# Patient Record
Sex: Male | Born: 1935 | Race: White | Hispanic: No | Marital: Married | Smoking: Former smoker
Health system: Southern US, Community
[De-identification: ages and names within clinical notes are randomized; demographics above are authoritative.]

## PROBLEM LIST (undated history)

## (undated) DIAGNOSIS — Z8619 Personal history of other infectious and parasitic diseases: Secondary | ICD-10-CM

## (undated) DIAGNOSIS — L309 Dermatitis, unspecified: Secondary | ICD-10-CM

## (undated) DIAGNOSIS — E119 Type 2 diabetes mellitus without complications: Secondary | ICD-10-CM

## (undated) DIAGNOSIS — A0472 Enterocolitis due to Clostridium difficile, not specified as recurrent: Secondary | ICD-10-CM

## (undated) DIAGNOSIS — H3500 Unspecified background retinopathy: Secondary | ICD-10-CM

## (undated) DIAGNOSIS — B269 Mumps without complication: Secondary | ICD-10-CM

## (undated) DIAGNOSIS — Q5310 Unspecified undescended testicle, unilateral: Secondary | ICD-10-CM

## (undated) DIAGNOSIS — H269 Unspecified cataract: Secondary | ICD-10-CM

## (undated) DIAGNOSIS — B029 Zoster without complications: Secondary | ICD-10-CM

## (undated) DIAGNOSIS — I639 Cerebral infarction, unspecified: Secondary | ICD-10-CM

## (undated) DIAGNOSIS — A3790 Whooping cough, unspecified species without pneumonia: Secondary | ICD-10-CM

## (undated) DIAGNOSIS — I4891 Unspecified atrial fibrillation: Secondary | ICD-10-CM

## (undated) HISTORY — DX: Whooping cough, unspecified species without pneumonia: A37.90

## (undated) HISTORY — DX: Unspecified atrial fibrillation: I48.91

## (undated) HISTORY — PX: TOOTH EXTRACTION: SUR596

## (undated) HISTORY — DX: Unspecified background retinopathy: H35.00

## (undated) HISTORY — DX: Unspecified undescended testicle, unilateral: Q53.10

## (undated) HISTORY — DX: Personal history of other infectious and parasitic diseases: Z86.19

## (undated) HISTORY — DX: Dermatitis, unspecified: L30.9

## (undated) HISTORY — DX: Mumps without complication: B26.9

## (undated) HISTORY — PX: TESTICLE SURGERY: SHX794

## (undated) HISTORY — PX: CARDIAC VALVE REPLACEMENT: SHX585

## (undated) HISTORY — PX: PRE-MALIGNANT / BENIGN SKIN LESION EXCISION: SHX160

## (undated) HISTORY — DX: Type 2 diabetes mellitus without complications: E11.9

## (undated) HISTORY — DX: Unspecified cataract: H26.9

## (undated) HISTORY — DX: Cerebral infarction, unspecified: I63.9

---

## 1998-09-23 DIAGNOSIS — E119 Type 2 diabetes mellitus without complications: Secondary | ICD-10-CM

## 1998-09-23 HISTORY — DX: Type 2 diabetes mellitus without complications: E11.9

## 2010-07-24 DIAGNOSIS — I639 Cerebral infarction, unspecified: Secondary | ICD-10-CM

## 2010-07-24 HISTORY — DX: Cerebral infarction, unspecified: I63.9

## 2013-09-27 DIAGNOSIS — Z7901 Long term (current) use of anticoagulants: Secondary | ICD-10-CM | POA: Diagnosis not present

## 2013-09-27 DIAGNOSIS — I4891 Unspecified atrial fibrillation: Secondary | ICD-10-CM | POA: Diagnosis not present

## 2013-10-04 DIAGNOSIS — E1139 Type 2 diabetes mellitus with other diabetic ophthalmic complication: Secondary | ICD-10-CM | POA: Diagnosis not present

## 2013-10-04 DIAGNOSIS — H2700 Aphakia, unspecified eye: Secondary | ICD-10-CM | POA: Diagnosis not present

## 2013-10-04 DIAGNOSIS — H35369 Drusen (degenerative) of macula, unspecified eye: Secondary | ICD-10-CM | POA: Diagnosis not present

## 2013-10-04 DIAGNOSIS — H26499 Other secondary cataract, unspecified eye: Secondary | ICD-10-CM | POA: Diagnosis not present

## 2013-10-28 DIAGNOSIS — L538 Other specified erythematous conditions: Secondary | ICD-10-CM | POA: Diagnosis not present

## 2013-10-28 DIAGNOSIS — L259 Unspecified contact dermatitis, unspecified cause: Secondary | ICD-10-CM | POA: Diagnosis not present

## 2013-11-08 DIAGNOSIS — I4891 Unspecified atrial fibrillation: Secondary | ICD-10-CM | POA: Diagnosis not present

## 2013-11-22 DIAGNOSIS — E7800 Pure hypercholesterolemia, unspecified: Secondary | ICD-10-CM | POA: Diagnosis not present

## 2013-11-22 DIAGNOSIS — E119 Type 2 diabetes mellitus without complications: Secondary | ICD-10-CM | POA: Diagnosis not present

## 2013-11-22 DIAGNOSIS — I4891 Unspecified atrial fibrillation: Secondary | ICD-10-CM | POA: Diagnosis not present

## 2013-11-29 DIAGNOSIS — E119 Type 2 diabetes mellitus without complications: Secondary | ICD-10-CM | POA: Diagnosis not present

## 2013-11-29 DIAGNOSIS — Z23 Encounter for immunization: Secondary | ICD-10-CM | POA: Diagnosis not present

## 2013-11-29 DIAGNOSIS — E7800 Pure hypercholesterolemia, unspecified: Secondary | ICD-10-CM | POA: Diagnosis not present

## 2013-12-13 DIAGNOSIS — I4891 Unspecified atrial fibrillation: Secondary | ICD-10-CM | POA: Diagnosis not present

## 2013-12-17 DIAGNOSIS — Z23 Encounter for immunization: Secondary | ICD-10-CM | POA: Diagnosis not present

## 2013-12-27 DIAGNOSIS — I4891 Unspecified atrial fibrillation: Secondary | ICD-10-CM | POA: Diagnosis not present

## 2014-02-11 DIAGNOSIS — I4891 Unspecified atrial fibrillation: Secondary | ICD-10-CM | POA: Diagnosis not present

## 2014-02-21 DIAGNOSIS — E7800 Pure hypercholesterolemia, unspecified: Secondary | ICD-10-CM | POA: Diagnosis not present

## 2014-02-21 DIAGNOSIS — Z0000 Encounter for general adult medical examination without abnormal findings: Secondary | ICD-10-CM | POA: Diagnosis not present

## 2014-02-21 DIAGNOSIS — Z125 Encounter for screening for malignant neoplasm of prostate: Secondary | ICD-10-CM | POA: Diagnosis not present

## 2014-02-21 DIAGNOSIS — E119 Type 2 diabetes mellitus without complications: Secondary | ICD-10-CM | POA: Diagnosis not present

## 2014-02-28 DIAGNOSIS — E119 Type 2 diabetes mellitus without complications: Secondary | ICD-10-CM | POA: Diagnosis not present

## 2014-03-07 DIAGNOSIS — I4891 Unspecified atrial fibrillation: Secondary | ICD-10-CM | POA: Diagnosis not present

## 2014-03-22 DIAGNOSIS — I4891 Unspecified atrial fibrillation: Secondary | ICD-10-CM | POA: Diagnosis not present

## 2014-04-04 DIAGNOSIS — E1139 Type 2 diabetes mellitus with other diabetic ophthalmic complication: Secondary | ICD-10-CM | POA: Diagnosis not present

## 2014-04-04 DIAGNOSIS — H2700 Aphakia, unspecified eye: Secondary | ICD-10-CM | POA: Diagnosis not present

## 2014-04-04 DIAGNOSIS — H35369 Drusen (degenerative) of macula, unspecified eye: Secondary | ICD-10-CM | POA: Diagnosis not present

## 2014-04-04 DIAGNOSIS — I4891 Unspecified atrial fibrillation: Secondary | ICD-10-CM | POA: Diagnosis not present

## 2014-04-06 DIAGNOSIS — Z7901 Long term (current) use of anticoagulants: Secondary | ICD-10-CM | POA: Diagnosis not present

## 2014-04-06 DIAGNOSIS — I4891 Unspecified atrial fibrillation: Secondary | ICD-10-CM | POA: Diagnosis not present

## 2014-04-08 DIAGNOSIS — I669 Occlusion and stenosis of unspecified cerebral artery: Secondary | ICD-10-CM | POA: Diagnosis not present

## 2014-04-08 DIAGNOSIS — I4891 Unspecified atrial fibrillation: Secondary | ICD-10-CM | POA: Diagnosis not present

## 2014-05-23 DIAGNOSIS — D1800 Hemangioma unspecified site: Secondary | ICD-10-CM | POA: Diagnosis not present

## 2014-05-23 DIAGNOSIS — Z85828 Personal history of other malignant neoplasm of skin: Secondary | ICD-10-CM | POA: Diagnosis not present

## 2014-05-23 DIAGNOSIS — L570 Actinic keratosis: Secondary | ICD-10-CM | POA: Diagnosis not present

## 2014-05-23 DIAGNOSIS — L821 Other seborrheic keratosis: Secondary | ICD-10-CM | POA: Diagnosis not present

## 2014-06-06 DIAGNOSIS — E119 Type 2 diabetes mellitus without complications: Secondary | ICD-10-CM | POA: Diagnosis not present

## 2014-06-06 DIAGNOSIS — I4891 Unspecified atrial fibrillation: Secondary | ICD-10-CM | POA: Diagnosis not present

## 2014-06-13 DIAGNOSIS — E119 Type 2 diabetes mellitus without complications: Secondary | ICD-10-CM | POA: Diagnosis not present

## 2014-06-13 DIAGNOSIS — M545 Low back pain, unspecified: Secondary | ICD-10-CM | POA: Diagnosis not present

## 2014-06-13 DIAGNOSIS — W19XXXA Unspecified fall, initial encounter: Secondary | ICD-10-CM | POA: Diagnosis not present

## 2014-07-04 DIAGNOSIS — I482 Chronic atrial fibrillation: Secondary | ICD-10-CM | POA: Diagnosis not present

## 2014-07-18 DIAGNOSIS — I482 Chronic atrial fibrillation: Secondary | ICD-10-CM | POA: Diagnosis not present

## 2014-07-25 DIAGNOSIS — E1165 Type 2 diabetes mellitus with hyperglycemia: Secondary | ICD-10-CM | POA: Diagnosis not present

## 2014-09-05 DIAGNOSIS — E1129 Type 2 diabetes mellitus with other diabetic kidney complication: Secondary | ICD-10-CM | POA: Diagnosis not present

## 2014-09-05 DIAGNOSIS — I482 Chronic atrial fibrillation: Secondary | ICD-10-CM | POA: Diagnosis not present

## 2014-09-12 DIAGNOSIS — E1165 Type 2 diabetes mellitus with hyperglycemia: Secondary | ICD-10-CM | POA: Diagnosis not present

## 2014-09-26 DIAGNOSIS — S52552A Other extraarticular fracture of lower end of left radius, initial encounter for closed fracture: Secondary | ICD-10-CM | POA: Diagnosis not present

## 2014-09-26 DIAGNOSIS — M25532 Pain in left wrist: Secondary | ICD-10-CM | POA: Diagnosis not present

## 2014-09-26 DIAGNOSIS — M25519 Pain in unspecified shoulder: Secondary | ICD-10-CM | POA: Diagnosis not present

## 2014-09-26 DIAGNOSIS — S40012A Contusion of left shoulder, initial encounter: Secondary | ICD-10-CM | POA: Diagnosis not present

## 2014-09-26 DIAGNOSIS — W19XXXA Unspecified fall, initial encounter: Secondary | ICD-10-CM | POA: Diagnosis not present

## 2014-09-26 DIAGNOSIS — M25512 Pain in left shoulder: Secondary | ICD-10-CM | POA: Diagnosis not present

## 2014-10-04 DIAGNOSIS — M25512 Pain in left shoulder: Secondary | ICD-10-CM | POA: Diagnosis not present

## 2014-10-04 DIAGNOSIS — S52552D Other extraarticular fracture of lower end of left radius, subsequent encounter for closed fracture with routine healing: Secondary | ICD-10-CM | POA: Diagnosis not present

## 2014-10-04 DIAGNOSIS — M5412 Radiculopathy, cervical region: Secondary | ICD-10-CM | POA: Diagnosis not present

## 2014-10-06 DIAGNOSIS — S134XXA Sprain of ligaments of cervical spine, initial encounter: Secondary | ICD-10-CM | POA: Diagnosis not present

## 2014-10-06 DIAGNOSIS — M5412 Radiculopathy, cervical region: Secondary | ICD-10-CM | POA: Diagnosis not present

## 2014-10-06 DIAGNOSIS — M9901 Segmental and somatic dysfunction of cervical region: Secondary | ICD-10-CM | POA: Diagnosis not present

## 2014-10-06 DIAGNOSIS — M25512 Pain in left shoulder: Secondary | ICD-10-CM | POA: Diagnosis not present

## 2014-10-11 DIAGNOSIS — M25532 Pain in left wrist: Secondary | ICD-10-CM | POA: Diagnosis not present

## 2014-10-18 DIAGNOSIS — S52552D Other extraarticular fracture of lower end of left radius, subsequent encounter for closed fracture with routine healing: Secondary | ICD-10-CM | POA: Diagnosis not present

## 2014-11-01 DIAGNOSIS — S52552D Other extraarticular fracture of lower end of left radius, subsequent encounter for closed fracture with routine healing: Secondary | ICD-10-CM | POA: Diagnosis not present

## 2014-11-15 DIAGNOSIS — H918X9 Other specified hearing loss, unspecified ear: Secondary | ICD-10-CM | POA: Diagnosis not present

## 2014-11-28 DIAGNOSIS — S52552D Other extraarticular fracture of lower end of left radius, subsequent encounter for closed fracture with routine healing: Secondary | ICD-10-CM | POA: Diagnosis not present

## 2014-12-05 DIAGNOSIS — I482 Chronic atrial fibrillation: Secondary | ICD-10-CM | POA: Diagnosis not present

## 2014-12-05 DIAGNOSIS — E1165 Type 2 diabetes mellitus with hyperglycemia: Secondary | ICD-10-CM | POA: Diagnosis not present

## 2014-12-13 DIAGNOSIS — E1165 Type 2 diabetes mellitus with hyperglycemia: Secondary | ICD-10-CM | POA: Diagnosis not present

## 2014-12-15 DIAGNOSIS — M9901 Segmental and somatic dysfunction of cervical region: Secondary | ICD-10-CM | POA: Diagnosis not present

## 2014-12-15 DIAGNOSIS — M25512 Pain in left shoulder: Secondary | ICD-10-CM | POA: Diagnosis not present

## 2014-12-15 DIAGNOSIS — S134XXA Sprain of ligaments of cervical spine, initial encounter: Secondary | ICD-10-CM | POA: Diagnosis not present

## 2014-12-15 DIAGNOSIS — M5412 Radiculopathy, cervical region: Secondary | ICD-10-CM | POA: Diagnosis not present

## 2014-12-22 DIAGNOSIS — I482 Chronic atrial fibrillation: Secondary | ICD-10-CM | POA: Diagnosis not present

## 2015-01-16 DIAGNOSIS — I482 Chronic atrial fibrillation: Secondary | ICD-10-CM | POA: Diagnosis not present

## 2015-01-30 DIAGNOSIS — M25512 Pain in left shoulder: Secondary | ICD-10-CM | POA: Diagnosis not present

## 2015-01-30 DIAGNOSIS — R0782 Intercostal pain: Secondary | ICD-10-CM | POA: Diagnosis not present

## 2015-01-30 DIAGNOSIS — Z7901 Long term (current) use of anticoagulants: Secondary | ICD-10-CM | POA: Diagnosis not present

## 2015-02-09 DIAGNOSIS — I4891 Unspecified atrial fibrillation: Secondary | ICD-10-CM | POA: Diagnosis not present

## 2015-02-09 DIAGNOSIS — Z7901 Long term (current) use of anticoagulants: Secondary | ICD-10-CM | POA: Diagnosis not present

## 2015-02-09 DIAGNOSIS — I480 Paroxysmal atrial fibrillation: Secondary | ICD-10-CM | POA: Diagnosis not present

## 2015-02-09 DIAGNOSIS — I669 Occlusion and stenosis of unspecified cerebral artery: Secondary | ICD-10-CM | POA: Diagnosis not present

## 2015-02-21 DIAGNOSIS — I482 Chronic atrial fibrillation: Secondary | ICD-10-CM | POA: Diagnosis not present

## 2015-02-27 DIAGNOSIS — Z961 Presence of intraocular lens: Secondary | ICD-10-CM | POA: Diagnosis not present

## 2015-02-27 DIAGNOSIS — Z0000 Encounter for general adult medical examination without abnormal findings: Secondary | ICD-10-CM | POA: Diagnosis not present

## 2015-02-27 DIAGNOSIS — E1139 Type 2 diabetes mellitus with other diabetic ophthalmic complication: Secondary | ICD-10-CM | POA: Diagnosis not present

## 2015-02-27 DIAGNOSIS — H35363 Drusen (degenerative) of macula, bilateral: Secondary | ICD-10-CM | POA: Diagnosis not present

## 2015-02-27 DIAGNOSIS — H2703 Aphakia, bilateral: Secondary | ICD-10-CM | POA: Diagnosis not present

## 2015-02-27 DIAGNOSIS — Z125 Encounter for screening for malignant neoplasm of prostate: Secondary | ICD-10-CM | POA: Diagnosis not present

## 2015-02-27 DIAGNOSIS — E1165 Type 2 diabetes mellitus with hyperglycemia: Secondary | ICD-10-CM | POA: Diagnosis not present

## 2015-02-28 DIAGNOSIS — M25512 Pain in left shoulder: Secondary | ICD-10-CM | POA: Diagnosis not present

## 2015-02-28 DIAGNOSIS — M9901 Segmental and somatic dysfunction of cervical region: Secondary | ICD-10-CM | POA: Diagnosis not present

## 2015-02-28 DIAGNOSIS — M797 Fibromyalgia: Secondary | ICD-10-CM | POA: Diagnosis not present

## 2015-02-28 DIAGNOSIS — M531 Cervicobrachial syndrome: Secondary | ICD-10-CM | POA: Diagnosis not present

## 2015-03-06 DIAGNOSIS — E1165 Type 2 diabetes mellitus with hyperglycemia: Secondary | ICD-10-CM | POA: Diagnosis not present

## 2015-03-06 DIAGNOSIS — W19XXXD Unspecified fall, subsequent encounter: Secondary | ICD-10-CM | POA: Diagnosis not present

## 2015-03-07 DIAGNOSIS — M25512 Pain in left shoulder: Secondary | ICD-10-CM | POA: Diagnosis not present

## 2015-03-07 DIAGNOSIS — M75101 Unspecified rotator cuff tear or rupture of right shoulder, not specified as traumatic: Secondary | ICD-10-CM | POA: Diagnosis not present

## 2015-03-07 DIAGNOSIS — S4352XA Sprain of left acromioclavicular joint, initial encounter: Secondary | ICD-10-CM | POA: Diagnosis not present

## 2015-03-10 DIAGNOSIS — L814 Other melanin hyperpigmentation: Secondary | ICD-10-CM | POA: Diagnosis not present

## 2015-03-10 DIAGNOSIS — L570 Actinic keratosis: Secondary | ICD-10-CM | POA: Diagnosis not present

## 2015-03-10 DIAGNOSIS — L303 Infective dermatitis: Secondary | ICD-10-CM | POA: Diagnosis not present

## 2015-03-10 DIAGNOSIS — Z85820 Personal history of malignant melanoma of skin: Secondary | ICD-10-CM | POA: Diagnosis not present

## 2015-03-16 DIAGNOSIS — M5032 Other cervical disc degeneration, mid-cervical region: Secondary | ICD-10-CM | POA: Diagnosis not present

## 2015-03-16 DIAGNOSIS — S134XXA Sprain of ligaments of cervical spine, initial encounter: Secondary | ICD-10-CM | POA: Diagnosis not present

## 2015-03-16 DIAGNOSIS — M5412 Radiculopathy, cervical region: Secondary | ICD-10-CM | POA: Diagnosis not present

## 2015-03-16 DIAGNOSIS — M9901 Segmental and somatic dysfunction of cervical region: Secondary | ICD-10-CM | POA: Diagnosis not present

## 2015-03-20 DIAGNOSIS — M25512 Pain in left shoulder: Secondary | ICD-10-CM | POA: Diagnosis not present

## 2015-03-20 DIAGNOSIS — R262 Difficulty in walking, not elsewhere classified: Secondary | ICD-10-CM | POA: Diagnosis not present

## 2015-03-23 DIAGNOSIS — M5412 Radiculopathy, cervical region: Secondary | ICD-10-CM | POA: Diagnosis not present

## 2015-03-23 DIAGNOSIS — M9901 Segmental and somatic dysfunction of cervical region: Secondary | ICD-10-CM | POA: Diagnosis not present

## 2015-03-23 DIAGNOSIS — M25512 Pain in left shoulder: Secondary | ICD-10-CM | POA: Diagnosis not present

## 2015-03-23 DIAGNOSIS — S4352XA Sprain of left acromioclavicular joint, initial encounter: Secondary | ICD-10-CM | POA: Diagnosis not present

## 2015-03-23 DIAGNOSIS — R262 Difficulty in walking, not elsewhere classified: Secondary | ICD-10-CM | POA: Diagnosis not present

## 2015-03-24 DIAGNOSIS — R2689 Other abnormalities of gait and mobility: Secondary | ICD-10-CM | POA: Diagnosis not present

## 2015-03-24 DIAGNOSIS — S00432A Contusion of left ear, initial encounter: Secondary | ICD-10-CM | POA: Diagnosis not present

## 2015-03-24 DIAGNOSIS — W19XXXA Unspecified fall, initial encounter: Secondary | ICD-10-CM | POA: Diagnosis not present

## 2015-03-24 DIAGNOSIS — Z7901 Long term (current) use of anticoagulants: Secondary | ICD-10-CM | POA: Diagnosis not present

## 2015-03-30 DIAGNOSIS — S23140A Subluxation of T6/T7 thoracic vertebra, initial encounter: Secondary | ICD-10-CM | POA: Diagnosis not present

## 2015-03-30 DIAGNOSIS — S2341XA Sprain of ribs, initial encounter: Secondary | ICD-10-CM | POA: Diagnosis not present

## 2015-03-30 DIAGNOSIS — G548 Other nerve root and plexus disorders: Secondary | ICD-10-CM | POA: Diagnosis not present

## 2015-03-30 DIAGNOSIS — M6240 Contracture of muscle, unspecified site: Secondary | ICD-10-CM | POA: Diagnosis not present

## 2015-04-06 DIAGNOSIS — S13160A Subluxation of C5/C6 cervical vertebrae, initial encounter: Secondary | ICD-10-CM | POA: Diagnosis not present

## 2015-04-06 DIAGNOSIS — M9901 Segmental and somatic dysfunction of cervical region: Secondary | ICD-10-CM | POA: Diagnosis not present

## 2015-04-06 DIAGNOSIS — S4352XA Sprain of left acromioclavicular joint, initial encounter: Secondary | ICD-10-CM | POA: Diagnosis not present

## 2015-04-06 DIAGNOSIS — M40293 Other kyphosis, cervicothoracic region: Secondary | ICD-10-CM | POA: Diagnosis not present

## 2015-04-13 DIAGNOSIS — M5412 Radiculopathy, cervical region: Secondary | ICD-10-CM | POA: Diagnosis not present

## 2015-04-13 DIAGNOSIS — R600 Localized edema: Secondary | ICD-10-CM | POA: Diagnosis not present

## 2015-04-13 DIAGNOSIS — Z7901 Long term (current) use of anticoagulants: Secondary | ICD-10-CM | POA: Diagnosis not present

## 2015-04-13 DIAGNOSIS — E785 Hyperlipidemia, unspecified: Secondary | ICD-10-CM | POA: Diagnosis not present

## 2015-04-13 DIAGNOSIS — M5032 Other cervical disc degeneration, mid-cervical region: Secondary | ICD-10-CM | POA: Diagnosis not present

## 2015-04-13 DIAGNOSIS — I480 Paroxysmal atrial fibrillation: Secondary | ICD-10-CM | POA: Diagnosis not present

## 2015-04-21 DIAGNOSIS — I480 Paroxysmal atrial fibrillation: Secondary | ICD-10-CM | POA: Diagnosis not present

## 2015-04-21 DIAGNOSIS — I348 Other nonrheumatic mitral valve disorders: Secondary | ICD-10-CM | POA: Diagnosis not present

## 2015-05-24 ENCOUNTER — Encounter: Payer: Self-pay | Admitting: Physician Assistant

## 2015-05-24 ENCOUNTER — Ambulatory Visit (INDEPENDENT_AMBULATORY_CARE_PROVIDER_SITE_OTHER): Payer: Medicare Other | Admitting: Physician Assistant

## 2015-05-24 VITALS — BP 104/66 | HR 65 | Temp 98.1°F | Resp 14 | Ht 66.0 in | Wt 177.0 lb

## 2015-05-24 DIAGNOSIS — Z8673 Personal history of transient ischemic attack (TIA), and cerebral infarction without residual deficits: Secondary | ICD-10-CM | POA: Diagnosis not present

## 2015-05-24 DIAGNOSIS — E785 Hyperlipidemia, unspecified: Secondary | ICD-10-CM | POA: Diagnosis not present

## 2015-05-24 DIAGNOSIS — E1165 Type 2 diabetes mellitus with hyperglycemia: Secondary | ICD-10-CM

## 2015-05-24 DIAGNOSIS — I480 Paroxysmal atrial fibrillation: Secondary | ICD-10-CM | POA: Diagnosis not present

## 2015-05-24 DIAGNOSIS — IMO0002 Reserved for concepts with insufficient information to code with codable children: Secondary | ICD-10-CM

## 2015-05-24 NOTE — Patient Instructions (Signed)
Please go to the lab for blood work. I will call you with your results.  Please stop they thyroid medication as you have never been diagnosed with low thyroid.  You will be contacted by Cardiology. You will also be contacted by PT for assessment.

## 2015-05-24 NOTE — Progress Notes (Signed)
Pre visit review using our clinic review tool, if applicable. No additional management support is needed unless otherwise documented below in the visit note/SLS  

## 2015-05-24 NOTE — Progress Notes (Signed)
 Patient presents to clinic today to establish care.  Patient with hx of stroke in 2011. Is currently on 325 mg ASA daily as he could not tolerate stronger therapies due to falls and bleeds.Is taking metoprolol for rate-control due to history of atrial fibrillation. Is in need of Cardiologist in the area. Patient currently on lisinopril 5 mg daily, Glipizide 5 mg and Metformin 500 mg once daily for diabetes that wife reports has previously been very well-controlled. Denies retinopathy, neuropathy or nephropathy. Is taking Lovastatin 10 mg daily to lower cholesterol.   Patient's wife patient is currently on levothyroxine daily without ever being diagnosed with hypothyroidism. States previous MD said it would help metabolism due to diabetes. States she thought this was "strange".  Past Medical History  Diagnosis Date  . Stroke Nov 2011  . Diabetes type 2, controlled 2000  . History of chicken pox   . Whooping cough   . Mumps     Adult  . A-fib   . Cataracts, bilateral   . Retinopathy   . Undescended testicle, unilateral     Right    Past Surgical History  Procedure Laterality Date  . Tooth extraction    . Pre-malignant / benign skin lesion excision    . Testicle surgery      Right, undescended    No current outpatient prescriptions on file prior to visit.   No current facility-administered medications on file prior to visit.    Allergies  Allergen Reactions  . Shellfish Allergy Anaphylaxis  . Other Other (See Comments)    Blood Thinner given in Rehab gave H/As - Not Coumadin    Family History  Problem Relation Age of Onset  . Pneumonia Mother 49    Deceased  . GI Bleed Father 56    Deceased - Ulcers  . Diabetes Cousin   . Diabetes Brother   . Cancer Paternal Aunt     Social History   Social History  . Marital Status: Married    Spouse Name: N/A  . Number of Children: N/A  . Years of Education: N/A   Occupational History  . Not on file.   Social History  Main Topics  . Smoking status: Never Smoker   . Smokeless tobacco: Not on file     Comment: Hasn't smoked since age 25  . Alcohol Use: Not on file  . Drug Use: Not on file  . Sexual Activity: Not on file   Other Topics Concern  . Not on file   Social History Narrative  . No narrative on file    Review of Systems  Eyes: Negative for blurred vision and double vision.  Respiratory: Negative for cough and sputum production.   Cardiovascular: Negative for chest pain and palpitations.  Neurological: Negative for dizziness, loss of consciousness and headaches.  Psychiatric/Behavioral: Negative for depression. The patient is not nervous/anxious.     BP 104/66 mmHg  Pulse 65  Temp(Src) 98.1 F (36.7 C) (Oral)  Resp 14  Ht 5' 6" (1.676 m)  Wt 177 lb (80.287 kg)  BMI 28.58 kg/m2  SpO2 95%  Physical Exam  Constitutional: He is oriented to person, place, and time and well-developed, well-nourished, and in no distress.  HENT:  Head: Normocephalic and atraumatic.  Eyes: Conjunctivae are normal.  Cardiovascular: Normal rate, regular rhythm, normal heart sounds and intact distal pulses.   Pulmonary/Chest: Effort normal and breath sounds normal. No respiratory distress. He has no wheezes. He has no rales. He exhibits   no tenderness.  Neurological: He is alert and oriented to person, place, and time.  Skin: Skin is warm and dry. No rash noted.  Psychiatric: Affect normal.  Vitals reviewed.   Recent Results (from the past 2160 hour(s))  CBC     Status: None   Collection Time: 05/24/15  3:43 PM  Result Value Ref Range   WBC 8.9 4.0 - 10.5 K/uL   RBC 4.61 4.22 - 5.81 Mil/uL   Platelets 210.0 150.0 - 400.0 K/uL   Hemoglobin 13.2 13.0 - 17.0 g/dL   HCT 39.3 39.0 - 52.0 %   MCV 85.3 78.0 - 100.0 fl   MCHC 33.5 30.0 - 36.0 g/dL   RDW 14.6 11.5 - 15.5 %  Comp Met (CMET)     Status: Abnormal   Collection Time: 05/24/15  3:43 PM  Result Value Ref Range   Sodium 138 135 - 145 mEq/L    Potassium 4.7 3.5 - 5.1 mEq/L   Chloride 103 96 - 112 mEq/L   CO2 25 19 - 32 mEq/L   Glucose, Bld 159 (H) 70 - 99 mg/dL   BUN 19 6 - 23 mg/dL   Creatinine, Ser 0.97 0.40 - 1.50 mg/dL   Total Bilirubin 0.4 0.2 - 1.2 mg/dL   Alkaline Phosphatase 65 39 - 117 U/L   AST 19 0 - 37 U/L   ALT 20 0 - 53 U/L   Total Protein 7.1 6.0 - 8.3 g/dL   Albumin 4.0 3.5 - 5.2 g/dL   Calcium 9.6 8.4 - 10.5 mg/dL   GFR 77.36 >60.00 mL/min  TSH     Status: None   Collection Time: 05/24/15  3:43 PM  Result Value Ref Range   TSH 3.38 0.35 - 4.50 uIU/mL  Hemoglobin A1c     Status: Abnormal   Collection Time: 05/24/15  3:43 PM  Result Value Ref Range   Hgb A1c MFr Bld 7.8 (H) 4.6 - 6.5 %    Comment: Glycemic Control Guidelines for People with Diabetes:Non Diabetic:  <6%Goal of Therapy: <7%Additional Action Suggested:  >8%     Assessment/Plan: Diabetes mellitus type II, uncontrolled Glycemic control unknown. Is not currently checking CBGs. Is on multiple medications. Will check BMP and A1C to assess need for medication changes. Continue medications as directed for now.    Will check TSH to assess need for his levothyroxine.  Hyperlipidemia Continue current medication regimen.  Paroxysmal atrial fibrillation Continue ASA 325 mg and metoprolol. Will refer to Cardiology to optimize patient's care.        

## 2015-05-25 LAB — CBC
HCT: 39.3 % (ref 39.0–52.0)
HEMOGLOBIN: 13.2 g/dL (ref 13.0–17.0)
MCHC: 33.5 g/dL (ref 30.0–36.0)
MCV: 85.3 fl (ref 78.0–100.0)
PLATELETS: 210 10*3/uL (ref 150.0–400.0)
RBC: 4.61 Mil/uL (ref 4.22–5.81)
RDW: 14.6 % (ref 11.5–15.5)
WBC: 8.9 10*3/uL (ref 4.0–10.5)

## 2015-05-25 LAB — COMPREHENSIVE METABOLIC PANEL
ALK PHOS: 65 U/L (ref 39–117)
ALT: 20 U/L (ref 0–53)
AST: 19 U/L (ref 0–37)
Albumin: 4 g/dL (ref 3.5–5.2)
BILIRUBIN TOTAL: 0.4 mg/dL (ref 0.2–1.2)
BUN: 19 mg/dL (ref 6–23)
CO2: 25 meq/L (ref 19–32)
CREATININE: 0.97 mg/dL (ref 0.40–1.50)
Calcium: 9.6 mg/dL (ref 8.4–10.5)
Chloride: 103 mEq/L (ref 96–112)
GFR: 77.36 mL/min (ref >60.00)
GLUCOSE: 159 mg/dL — AB (ref 70–99)
Potassium: 4.7 mEq/L (ref 3.5–5.1)
Sodium: 138 mEq/L (ref 135–145)
TOTAL PROTEIN: 7.1 g/dL (ref 6.0–8.3)

## 2015-05-25 LAB — HEMOGLOBIN A1C: Hgb A1c MFr Bld: 7.8 % — ABNORMAL HIGH (ref 4.6–6.5)

## 2015-05-25 LAB — TSH: TSH: 3.38 u[IU]/mL (ref 0.35–4.50)

## 2015-05-29 DIAGNOSIS — E1165 Type 2 diabetes mellitus with hyperglycemia: Secondary | ICD-10-CM | POA: Insufficient documentation

## 2015-05-29 DIAGNOSIS — E785 Hyperlipidemia, unspecified: Secondary | ICD-10-CM | POA: Insufficient documentation

## 2015-05-29 DIAGNOSIS — I480 Paroxysmal atrial fibrillation: Secondary | ICD-10-CM | POA: Insufficient documentation

## 2015-05-29 DIAGNOSIS — Z8673 Personal history of transient ischemic attack (TIA), and cerebral infarction without residual deficits: Secondary | ICD-10-CM | POA: Insufficient documentation

## 2015-05-29 DIAGNOSIS — IMO0002 Reserved for concepts with insufficient information to code with codable children: Secondary | ICD-10-CM | POA: Insufficient documentation

## 2015-05-29 NOTE — Assessment & Plan Note (Addendum)
Glycemic control unknown. Is not currently checking CBGs. Is on multiple medications. Will check BMP and A1C to assess need for medication changes. Continue medications as directed for now.    Will check TSH to assess need for his levothyroxine.

## 2015-05-29 NOTE — Assessment & Plan Note (Signed)
Continue current medication regimen

## 2015-05-29 NOTE — Assessment & Plan Note (Signed)
Continue ASA 325 mg and metoprolol. Will refer to Cardiology to optimize patient's care.

## 2015-06-01 ENCOUNTER — Telehealth: Payer: Self-pay | Admitting: Physician Assistant

## 2015-06-01 NOTE — Telephone Encounter (Signed)
Caller name:Wisnieski,Elnora Relation to pt: spouse  Call back number: [phone]   Reason for call:  Spouse returning your call regarding lab results. Spouse states please leave detailed message

## 2015-06-02 NOTE — Telephone Encounter (Signed)
SEE Result note. 

## 2015-06-03 DIAGNOSIS — R296 Repeated falls: Secondary | ICD-10-CM | POA: Diagnosis not present

## 2015-06-03 DIAGNOSIS — I480 Paroxysmal atrial fibrillation: Secondary | ICD-10-CM | POA: Diagnosis not present

## 2015-06-03 DIAGNOSIS — E119 Type 2 diabetes mellitus without complications: Secondary | ICD-10-CM | POA: Diagnosis not present

## 2015-06-03 DIAGNOSIS — I4891 Unspecified atrial fibrillation: Secondary | ICD-10-CM | POA: Diagnosis not present

## 2015-06-03 DIAGNOSIS — E039 Hypothyroidism, unspecified: Secondary | ICD-10-CM | POA: Diagnosis not present

## 2015-06-03 DIAGNOSIS — Z87891 Personal history of nicotine dependence: Secondary | ICD-10-CM | POA: Diagnosis not present

## 2015-06-03 DIAGNOSIS — Z7982 Long term (current) use of aspirin: Secondary | ICD-10-CM | POA: Diagnosis not present

## 2015-06-03 DIAGNOSIS — Z8673 Personal history of transient ischemic attack (TIA), and cerebral infarction without residual deficits: Secondary | ICD-10-CM | POA: Diagnosis not present

## 2015-06-03 DIAGNOSIS — R0789 Other chest pain: Secondary | ICD-10-CM | POA: Diagnosis not present

## 2015-06-03 DIAGNOSIS — I4510 Unspecified right bundle-branch block: Secondary | ICD-10-CM | POA: Diagnosis not present

## 2015-06-03 DIAGNOSIS — R002 Palpitations: Secondary | ICD-10-CM | POA: Diagnosis not present

## 2015-06-03 DIAGNOSIS — Z79899 Other long term (current) drug therapy: Secondary | ICD-10-CM | POA: Diagnosis not present

## 2015-06-04 DIAGNOSIS — I480 Paroxysmal atrial fibrillation: Secondary | ICD-10-CM | POA: Diagnosis not present

## 2015-06-05 ENCOUNTER — Other Ambulatory Visit: Payer: Self-pay | Admitting: Physician Assistant

## 2015-06-05 DIAGNOSIS — Z8673 Personal history of transient ischemic attack (TIA), and cerebral infarction without residual deficits: Secondary | ICD-10-CM

## 2015-06-09 ENCOUNTER — Ambulatory Visit (INDEPENDENT_AMBULATORY_CARE_PROVIDER_SITE_OTHER): Payer: Medicare Other | Admitting: Physician Assistant

## 2015-06-09 VITALS — BP 112/60 | HR 62 | Temp 97.5°F | Resp 20 | Wt 173.8 lb

## 2015-06-09 DIAGNOSIS — I480 Paroxysmal atrial fibrillation: Secondary | ICD-10-CM | POA: Diagnosis not present

## 2015-06-09 DIAGNOSIS — E871 Hypo-osmolality and hyponatremia: Secondary | ICD-10-CM | POA: Diagnosis not present

## 2015-06-09 LAB — BASIC METABOLIC PANEL
BUN: 28 mg/dL — AB (ref 6–23)
CO2: 24 meq/L (ref 19–32)
Calcium: 9.6 mg/dL (ref 8.4–10.5)
Chloride: 97 mEq/L (ref 96–112)
Creatinine, Ser: 1.01 mg/dL (ref 0.40–1.50)
GFR: 73.83 mL/min (ref >60.00)
GLUCOSE: 225 mg/dL — AB (ref 70–99)
POTASSIUM: 4.3 meq/L (ref 3.5–5.1)
Sodium: 133 mEq/L — ABNORMAL LOW (ref 135–145)

## 2015-06-09 MED ORDER — LISINOPRIL 5 MG PO TABS: 5.0000 mg | ORAL_TABLET | Freq: Every day | ORAL | Status: DC

## 2015-06-09 MED ORDER — GLIPIZIDE 5 MG PO TABS: ORAL_TABLET | ORAL | Status: DC

## 2015-06-09 MED ORDER — AMIODARONE HCL 200 MG PO TABS: 200.0000 mg | ORAL_TABLET | Freq: Every day | ORAL | Status: DC

## 2015-06-09 MED ORDER — LOVASTATIN 10 MG PO TABS: 10.0000 mg | ORAL_TABLET | Freq: Every day | ORAL | Status: DC

## 2015-06-09 MED ORDER — METOPROLOL TARTRATE 25 MG PO TABS: 12.5000 mg | ORAL_TABLET | Freq: Two times a day (BID) | ORAL | Status: DC

## 2015-06-09 MED ORDER — METFORMIN HCL ER 500 MG PO TB24: 500.0000 mg | ORAL_TABLET | Freq: Two times a day (BID) | ORAL | Status: DC

## 2015-06-09 NOTE — Patient Instructions (Signed)
Please continue medications as directed, including the Amiodarone.  Our office will call you once we have attempted to move up your Cardiology appointment.  Stay well hydrated and get plenty of rest. Energy levels should return soon.  Go to the lab for blood work.  I will call you with results.

## 2015-06-09 NOTE — Progress Notes (Signed)
Pre visit review using our clinic review tool, if applicable. No additional management support is needed unless otherwise documented below in the visit note. 

## 2015-06-12 ENCOUNTER — Encounter: Payer: Self-pay | Admitting: Physician Assistant

## 2015-06-16 ENCOUNTER — Encounter: Payer: Self-pay | Admitting: Physician Assistant

## 2015-06-22 ENCOUNTER — Encounter: Payer: Self-pay | Admitting: Cardiology

## 2015-06-22 ENCOUNTER — Ambulatory Visit (INDEPENDENT_AMBULATORY_CARE_PROVIDER_SITE_OTHER): Payer: Medicare Other | Admitting: Cardiology

## 2015-06-22 VITALS — BP 118/60 | HR 63 | Ht 66.0 in | Wt 176.0 lb

## 2015-06-22 DIAGNOSIS — E1165 Type 2 diabetes mellitus with hyperglycemia: Secondary | ICD-10-CM

## 2015-06-22 DIAGNOSIS — Z8673 Personal history of transient ischemic attack (TIA), and cerebral infarction without residual deficits: Secondary | ICD-10-CM

## 2015-06-22 DIAGNOSIS — E785 Hyperlipidemia, unspecified: Secondary | ICD-10-CM

## 2015-06-22 DIAGNOSIS — I480 Paroxysmal atrial fibrillation: Secondary | ICD-10-CM

## 2015-06-22 DIAGNOSIS — W19XXXS Unspecified fall, sequela: Secondary | ICD-10-CM

## 2015-06-22 DIAGNOSIS — R296 Repeated falls: Secondary | ICD-10-CM | POA: Insufficient documentation

## 2015-06-22 DIAGNOSIS — IMO0002 Reserved for concepts with insufficient information to code with codable children: Secondary | ICD-10-CM

## 2015-06-22 DIAGNOSIS — W19XXXA Unspecified fall, initial encounter: Secondary | ICD-10-CM | POA: Insufficient documentation

## 2015-06-22 MED ORDER — ASPIRIN 81 MG PO TABS: 81.0000 mg | ORAL_TABLET | Freq: Every day | ORAL | Status: DC

## 2015-06-22 MED ORDER — LISINOPRIL 2.5 MG PO TABS: 2.5000 mg | ORAL_TABLET | Freq: Every day | ORAL | Status: DC

## 2015-06-22 NOTE — Progress Notes (Signed)
Cardiology Office Note   Date:  06/22/2015   ID:  Gerald Holt, DOB 01/22/1924, MRN [id number]  PCP:  Gerald Rio, PA-C  Cardiologist:   Gerald Furbish, MD       History of Present Illness: Gerald Holt is a 81 y.o. male here for evaluation of atrial fibrillation, to establish care. He is a prior history of stroke, diabetes (hemoglobin A1c 7.8), hyperlipidemia.  AFIB was diagnosed in 1984. Chest pressure ([place]). Cold glass of OJ brought it on). He was doing some work around the house, was hospitalized for 4 days. Metoprolol started then.  1991 next episode, working around the house. Looked gray. Reverted quickly.   2011 next episode. Occurred at Night.   8/16 - this is his last episode which happened a few weeks ago at night had episode. 24 hours reverted. Started amiodarone. Prefer not to be on drugs if possible. Wife was very helpful with history. Has not been the same since then.   [place] five times this year. One of the falls occurred and was quite serious resulting in fractured ribs, fell into a ditch while trying to use a weedeater. Because of the fall, his anticoagulation was discontinued. Aspirin was started.   About to start PT.   Moved from [facility], saw [facility] Dr. Corrin Holt.   CHADS-VASc is at least 6.  Now off thyroid. Medication.  Stroke 2011 - was on anticoagulation for 5 years. Coumadin. Falls. ECHO - looked fine. Broke ribs, head.   No prior cardioversions.  Past Medical History  Diagnosis Date  . Stroke Nov 2011  . Diabetes type 2, controlled 2000  . History of chicken pox   . Whooping cough   . Mumps     Adult  . A-fib   . Cataracts, bilateral   . Retinopathy   . Undescended testicle, unilateral     Right    Past Surgical History  Procedure Laterality Date  . Tooth extraction    . Pre-malignant / benign skin lesion excision    . Testicle surgery      Right, undescended     Current Outpatient  Prescriptions  Medication Sig Dispense Refill  . amiodarone (PACERONE) 200 MG tablet Take 1 tablet (200 mg total) by mouth daily. 90 tablet 0  . Ascorbic Acid (VITAMIN C) 1000 MG tablet Take 1,000 mg by mouth daily.    Marland Kitchen aspirin 325 MG tablet Take 325 mg by mouth daily.    . Calcium Citrate 200 MG TABS Take 1 tablet by mouth daily.    . Cholecalciferol (VITAMIN D-3) 1000 UNITS CAPS Take 1 capsule by mouth 2 (two) times daily.    Marland Kitchen docusate sodium (COLACE) 100 MG capsule Take 100 mg by mouth daily.    . Eyelid Cleansers (STERILID EX) Apply topically. Eye Wash: Once Daily    . glipiZIDE (GLUCOTROL) 5 MG tablet Take [2] in Am after B'fast and [1] in PM after Dinner 270 tablet 1  . ketoconazole (NIZORAL) 2 % shampoo Apply topically.    Marland Kitchen levothyroxine (SYNTHROID, LEVOTHROID) 50 MCG tablet Take by mouth.    Marland Kitchen lisinopril (PRINIVIL,ZESTRIL) 5 MG tablet Take 1 tablet (5 mg total) by mouth daily. 90 tablet 1  . loratadine (SM LORATADINE) 5 MG/5ML syrup Take by mouth as needed.    . lovastatin (MEVACOR) 10 MG tablet Take 1 tablet (10 mg total) by mouth at bedtime. 90 tablet 1  . metFORMIN (GLUCOPHAGE-XR) 500 MG 24 hr tablet  Take 1 tablet (500 mg total) by mouth 2 (two) times daily with a meal. [2] in AM with B'fast 180 tablet 1  . metoprolol tartrate (LOPRESSOR) 25 MG tablet Take 0.5 tablets (12.5 mg total) by mouth 2 (two) times daily. 180 tablet 1  . Miconazole Nitrate (TRIPLE PASTE AF EX) Apply topically. Jock Itch Cream: 1-2 Times Daily As Needed    . Multiple Vitamin (MULTI-VITAMINS) TABS Take by mouth.    . Multiple Vitamins-Minerals (CENTRUM SILVER ULTRA MENS) TABS Take 1 tablet by mouth daily.    . Omega-3 Fatty Acids (SB OMEGA-3 FISH OIL) 1000 MG CAPS Take by mouth.    Marland Kitchen OVER THE COUNTER MEDICATION Take 1 each by mouth 2 (two) times daily. Focus Select Supplement: Vitamin C & E, Zinc & Copper    . saw palmetto 160 MG capsule Take 160 mg by mouth daily. Saw Palmetto Berry Extract: Prostate    .  Sennosides (LAXATIVE PILLS PO) Take 2 capsules by mouth daily. GENTLE MOVE     No current facility-administered medications for this visit.    Allergies:   Shellfish allergy and Other    Social History:  The patient  reports that he has never smoked. He does not have any smokeless tobacco history on file.   Family History:  The patient's family history includes Cancer in his paternal aunt; Diabetes in his brother and cousin; GI Bleed (age of onset: 1) in his father; Pneumonia (age of onset: 60) in his mother.    ROS:  Please see the history of present illness. Has recent history of increased nausea, falls, decreased energy.   Otherwise, review of systems are positive for none.   All other systems are reviewed and negative.    PHYSICAL EXAM: VS:  BP 118/60 mmHg  Pulse 63  Ht 5\' 6"  (1.676 m)  Wt 176 lb (79.833 kg)  BMI 28.42 kg/m2 , BMI Body mass index is 28.42 kg/(m^2). GEN: Elderly, in no acute distress HEENT: normal Neck: no JVD, carotid bruits, or masses Cardiac: RRR; Soft systolic RUSB murmur, norubs, or gallops, 1-2+ BLE edema  Respiratory:  clear to auscultation bilaterally, normal work of breathing GI: soft, nontender, nondistended, + BS MS: no deformity or atrophy Skin: warm and dry, no rash Neuro:  Strength and sensation are intact Psych: euthymic mood, full affect   EKG: Today 06/22/15-sinus rhythm, 63, right bundle branch block, no other abnormalities.   Recent Labs: 05/24/2015: ALT 20; Hemoglobin 13.2; Platelets 210.0; TSH 3.38 06/09/2015: BUN 28*; Creatinine, Ser 1.01; Potassium 4.3; Sodium 133*    Lipid Panel No results found for: CHOL, TRIG, HDL, CHOLHDL, VLDL, LDLCALC, LDLDIRECT    Wt Readings from Last 3 Encounters:  06/22/15 176 lb (79.833 kg)  06/09/15 173 lb 12.8 oz (78.835 kg)  05/24/15 177 lb (80.287 kg)      Other studies Reviewed: Additional studies/ records that were reviewed today include: Lab work, office note, EKG reviewed. We will be  obtaining old cardiology records. Review of the above records demonstrates: As above   ASSESSMENT AND PLAN:  1.  Paroxysmal atrial fibrillation  - Currently on amiodarone - TSH normal, ALT normal  - His wife is quite eager for him to be off of the amiodarone and as it was post to her when it was started, I could help make that decision when they moved to [place]. She would like to go and stop this. Concerned about potential side effects. We discussed the potential for his atrial fibrillation  to return. When it does, his heart rate is usually in the 180 range. I would have a low threshold for resuming amiodarone if his atrial for ablation were to return. We also discussed the possibility of maintaining sinus rhythm especially since he is not on anticoagulation. He did have an event monitor prior to discontinuation of Coumadin that showed no evidence of atrial fibrillation. Interestingly, his episodes are quite sporadic and he does not seem to follow the usual timeline of increased bouts of atrial fibrillation with advanced age. We will continue with low-dose metoprolol. Theoretically, I explained that if we were to maintain sinus rhythm, perhaps this would reduce his overall stroke risk, hence continuation of amiodarone. Nonetheless, weighing the pros and cons, she would like for him to come off of that medication.   - Not on anticoagulation. CHADSVASc of 6. Increased risk of stroke without anticoagulation. Discussed. Understands risk of stroke without anticoagulation. Understands that aspirin does not fully reduce the risk of stroke.  2. Diabetes-hemoglobin A1c 7.8 per Elyn Aquas, primary provider. They're also concerned about discontinuation of thyroid medication. I asked them to discuss this further with primary provider.  - I will decrease lisinopril to 2.5 mg once a day. This will maintain renal protection with diabetes but will help increase his blood pressure which is quite low.  3.  Hyperlipidemia-currently on Mevacor. ALT normal.  4. Right bundle branch block-chronic  Ultimately, he seems to have declined from a stamina standpoint, equilibrium standpoint over this past year with subsequent falls and to "his wife, she would like her old husband back. She understands however being realistic that this may be challenging but also appreciates the fact that we are helping him to the best of our abilities.   Current medicines are reviewed at length with the patient today.  The patient does not have concerns regarding medicines.  The following changes have been made:  We will stop amiodarone, decrease lisinopril to 2.5 mg once a day.  Labs/ tests ordered today include:  No orders of the defined types were placed in this encounter.     Disposition:   FU with [facility] in 6 months  Signed, Gerald Furbish, MD  06/22/2015 11:33 AM    [facility] [place], [place],   [address] Phone: [phone]; Fax: [phone]

## 2015-06-22 NOTE — Patient Instructions (Signed)
Medication Instructions:  Please stop your Amiodarone. Decrease ASA to 81 mg a day and Lisinopril to 2.5 mg a day. Continue all other medications as listed.  Follow-Up: Follow up in 6 months with Dr. Marlou Porch.  You will receive a letter in the mail 2 months before you are due.  Please call us when you receive this letter to schedule your follow up appointment.  Thank you for choosing [facility]!!

## 2015-06-23 ENCOUNTER — Encounter: Payer: Self-pay | Admitting: Physician Assistant

## 2015-06-23 DIAGNOSIS — E871 Hypo-osmolality and hyponatremia: Secondary | ICD-10-CM | POA: Insufficient documentation

## 2015-06-23 DIAGNOSIS — Z7689 Persons encountering health services in other specified circumstances: Secondary | ICD-10-CM

## 2015-06-23 NOTE — Assessment & Plan Note (Signed)
With acute exacerbation due to medication compliance causing need for hospital evaluation. Is doing well overall. Wife endorses medication compliance. Vitals stable today. Continue current regimen. Follow-up with Cardiology this coming week as scheduled.

## 2015-06-23 NOTE — Progress Notes (Signed)
Patient presents to clinic today for hospital follow-up of atrial Fibrillation. Patient admitted to hospital on 06/03/2015 after presenting to ER via ambulance c/o chest pain.  Patient had been non-compliant with medications. Patient started on Cardizem drip without response so Amiodarone drip started, converting patient to sinus rhythm. Was monitored for 24 hours with troponins cycled. Cardiology consulted. Patient discharged on Amiodarone 200 mg daily with instructions to follow-up with Cardiology.  Since discharge patient doing well overall. Denies chest pain, palpitations, lightheadedness or shortness of breath since discharge. Wife endorses he is taking mediations as directed. Endorses some residual fatigue.   Past Medical History  Diagnosis Date  . Stroke Nov 2011  . Diabetes type 2, controlled 2000  . History of chicken pox   . Whooping cough   . Mumps     Adult  . A-fib   . Cataracts, bilateral   . Retinopathy   . Undescended testicle, unilateral     Right    Current Outpatient Prescriptions on File Prior to Visit  Medication Sig Dispense Refill  . Ascorbic Acid (VITAMIN C) 1000 MG tablet Take 1,000 mg by mouth daily.    . Calcium Citrate 200 MG TABS Take 1 tablet by mouth daily.    . Cholecalciferol (VITAMIN D-3) 1000 UNITS CAPS Take 1 capsule by mouth 2 (two) times daily.    Marland Kitchen docusate sodium (COLACE) 100 MG capsule Take 100 mg by mouth daily.    . Eyelid Cleansers (STERILID EX) Apply topically. Eye Wash: Once Daily    . Miconazole Nitrate (TRIPLE PASTE AF EX) Apply topically. Jock Itch Cream: 1-2 Times Daily As Needed    . Multiple Vitamins-Minerals (CENTRUM SILVER ULTRA MENS) TABS Take 1 tablet by mouth daily.    . Omega-3 Fatty Acids (SB OMEGA-3 FISH OIL) 1000 MG CAPS Take by mouth.    . saw palmetto 160 MG capsule Take 160 mg by mouth daily. Saw Palmetto Berry Extract: Prostate    . Sennosides (LAXATIVE PILLS PO) Take 2 capsules by mouth daily. GENTLE MOVE    . OVER  THE COUNTER MEDICATION Take 1 each by mouth 2 (two) times daily. Focus Select Supplement: Vitamin C & E, Zinc & Copper     No current facility-administered medications on file prior to visit.    Allergies  Allergen Reactions  . Shellfish Allergy Anaphylaxis  . Other Other (See Comments)    Blood Thinner given in Rehab gave H/As - Not Coumadin     Family History  Problem Relation Age of Onset  . Pneumonia Mother 98    Deceased  . GI Bleed Father 40    Deceased - Ulcers  . Diabetes Cousin   . Diabetes Brother   . Cancer Paternal Aunt     Social History   Social History  . Marital Status: Married    Spouse Name: N/A  . Number of Children: N/A  . Years of Education: N/A   Social History Main Topics  . Smoking status: Never Smoker   . Smokeless tobacco: Not on file     Comment: Hasn't smoked since age 47  . Alcohol Use: Not on file  . Drug Use: Not on file  . Sexual Activity: Not on file   Other Topics Concern  . Not on file   Social History Narrative    Review of Systems - See HPI.  All other ROS are negative.  BP 112/60 mmHg  Pulse 62  Temp(Src) 97.5 F (36.4 C) (Oral)  Resp 20  Wt 173 lb 12.8 oz (78.835 kg)  SpO2 97%  Physical Exam  Constitutional: He is oriented to person, place, and time and well-developed, well-nourished, and in no distress.  HENT:  Head: Normocephalic and atraumatic.  Eyes: Conjunctivae are normal.  Cardiovascular: Normal rate, regular rhythm, normal heart sounds and intact distal pulses.   Pulmonary/Chest: Effort normal and breath sounds normal. No respiratory distress. He has no wheezes. He has no rales. He exhibits no tenderness.  Neurological: He is alert and oriented to person, place, and time.  Skin: Skin is warm and dry. No rash noted.  Psychiatric: Affect normal.  Vitals reviewed.   Recent Results (from the past 2160 hour(s))  CBC     Status: None   Collection Time: 05/24/15  3:43 PM  Result Value Ref Range   WBC 8.9  4.0 - 10.5 K/uL   RBC 4.61 4.22 - 5.81 Mil/uL   Platelets 210.0 150.0 - 400.0 K/uL   Hemoglobin 13.2 13.0 - 17.0 g/dL   HCT 39.3 39.0 - 52.0 %   MCV 85.3 78.0 - 100.0 fl   MCHC 33.5 30.0 - 36.0 g/dL   RDW 14.6 11.5 - 15.5 %  Comp Met (CMET)     Status: Abnormal   Collection Time: 05/24/15  3:43 PM  Result Value Ref Range   Sodium 138 135 - 145 mEq/L   Potassium 4.7 3.5 - 5.1 mEq/L   Chloride 103 96 - 112 mEq/L   CO2 25 19 - 32 mEq/L   Glucose, Bld 159 (H) 70 - 99 mg/dL   BUN 19 6 - 23 mg/dL   Creatinine, Ser 0.97 0.40 - 1.50 mg/dL   Total Bilirubin 0.4 0.2 - 1.2 mg/dL   Alkaline Phosphatase 65 39 - 117 U/L   AST 19 0 - 37 U/L   ALT 20 0 - 53 U/L   Total Protein 7.1 6.0 - 8.3 g/dL   Albumin 4.0 3.5 - 5.2 g/dL   Calcium 9.6 8.4 - 10.5 mg/dL   GFR 77.36 >60.00 mL/min  TSH     Status: None   Collection Time: 05/24/15  3:43 PM  Result Value Ref Range   TSH 3.38 0.35 - 4.50 uIU/mL  Hemoglobin A1c     Status: Abnormal   Collection Time: 05/24/15  3:43 PM  Result Value Ref Range   Hgb A1c MFr Bld 7.8 (H) 4.6 - 6.5 %    Comment: Glycemic Control Guidelines for People with Diabetes:Non Diabetic:  <6%Goal of Therapy: <7%Additional Action Suggested:  >9%   Basic Metabolic Panel (BMET)     Status: Abnormal   Collection Time: 06/09/15 12:09 PM  Result Value Ref Range   Sodium 133 (L) 135 - 145 mEq/L   Potassium 4.3 3.5 - 5.1 mEq/L   Chloride 97 96 - 112 mEq/L   CO2 24 19 - 32 mEq/L   Glucose, Bld 225 (H) 70 - 99 mg/dL   BUN 28 (H) 6 - 23 mg/dL   Creatinine, Ser 1.01 0.40 - 1.50 mg/dL   Calcium 9.6 8.4 - 10.5 mg/dL   GFR 73.83 >60.00 mL/min    Assessment/Plan: Paroxysmal atrial fibrillation With acute exacerbation due to medication compliance causing need for hospital evaluation. Is doing well overall. Wife endorses medication compliance. Vitals stable today. Continue current regimen. Follow-up with Cardiology this coming week as scheduled.  Sodium blood decreased Noted in  hospital labs in [place]. Will repeat labs today.

## 2015-06-23 NOTE — Assessment & Plan Note (Signed)
Noted in hospital labs in [place]. Will repeat labs today.

## 2015-06-29 ENCOUNTER — Telehealth: Payer: Self-pay | Admitting: Cardiology

## 2015-06-29 NOTE — Telephone Encounter (Signed)
ROI faxed to [place] records rec back placed in chart prep Bin.

## 2015-06-30 ENCOUNTER — Telehealth: Payer: Self-pay | Admitting: Cardiology

## 2015-06-30 NOTE — Telephone Encounter (Signed)
New problem   Need to clarify dosage of pt's Lisinopril. Please call.

## 2015-06-30 NOTE — Telephone Encounter (Signed)
[employer] calling wanting to clarify dose of Lisinopril.  Advised Dr. Marlou Porch decreased dose on 9/29 at office visit to Lisinopril 2.5 mg.  They changed order in their system and will send 2.5 mg to pt.

## 2015-07-03 ENCOUNTER — Telehealth: Payer: Self-pay | Admitting: Physician Assistant

## 2015-07-03 NOTE — Telephone Encounter (Signed)
[facility] - Client TELEPHONE ADVICE RECORD   [facility]     Patient Name: Gerald Holt Initial Comment Caller states her husband is having digestive issues, he vomited blood. Last week he thought he might of seen some blood in his stool.  DOB: Jun 24, 1924      Nurse Assessment  Nurse: Julien Girt, RN, Almyra Free Date/Time Eilene Ghazi Time): 07/03/2015 11:28:38 AM  Confirm and document reason for call. If symptomatic, describe symptoms. ---Caller states her husband was in the hospital once mth ago he has had indigestion constantly. Last week he had a "black strip in his stool", and Saturday night he vomited pink. He has nausea but denies any further sx at this time. States he has not vomited since his "late 20's" until, Saturday night. He is awake, alert this morning, no further nausea or vomiting. She is concerned he may have an ulcer.  Has the patient traveled out of the country within the last 30 days? ---Not Applicable  Does the patient have any new or worsening symptoms? ---Yes  Will a triage be completed? ---Yes  Related visit to physician within the last 2 weeks? ---No  Does the PT have any chronic conditions? (i.e. diabetes, asthma, etc.) ---Yes  List chronic conditions. ---A fib( no thinner) htn, diabetes    Guidelines     Guideline Title Affirmed Question Affirmed Notes   Abdominal Pain - Upper [1] Abdominal pain is intermittent AND [2] shoots into chest, with sour taste in mouth    Final Disposition User   See PCP When Office is Open (within 3 days) Julien Girt, Therapist, sports, Almyra Free

## 2015-07-03 NOTE — Telephone Encounter (Signed)
Appointment noted.  Message routed to PCP for FYI.

## 2015-07-04 ENCOUNTER — Telehealth: Payer: Self-pay | Admitting: *Deleted

## 2015-07-04 ENCOUNTER — Encounter: Payer: Self-pay | Admitting: Physician Assistant

## 2015-07-04 ENCOUNTER — Ambulatory Visit (INDEPENDENT_AMBULATORY_CARE_PROVIDER_SITE_OTHER): Payer: Medicare Other | Admitting: Physician Assistant

## 2015-07-04 VITALS — BP 129/53 | HR 62 | Resp 16 | Ht 66.0 in | Wt 177.2 lb

## 2015-07-04 DIAGNOSIS — K219 Gastro-esophageal reflux disease without esophagitis: Secondary | ICD-10-CM | POA: Diagnosis not present

## 2015-07-04 DIAGNOSIS — E785 Hyperlipidemia, unspecified: Secondary | ICD-10-CM | POA: Diagnosis not present

## 2015-07-04 DIAGNOSIS — R946 Abnormal results of thyroid function studies: Secondary | ICD-10-CM | POA: Diagnosis not present

## 2015-07-04 DIAGNOSIS — L03211 Cellulitis of face: Secondary | ICD-10-CM

## 2015-07-04 DIAGNOSIS — R7989 Other specified abnormal findings of blood chemistry: Secondary | ICD-10-CM

## 2015-07-04 LAB — LIPID PANEL
CHOL/HDL RATIO: 3
Cholesterol: 95 mg/dL (ref 0–200)
HDL: 33.3 mg/dL — AB (ref >39.00)
LDL CALC: 45 mg/dL (ref 0–99)
NONHDL: 61.89
Triglycerides: 85 mg/dL (ref 0.0–149.0)
VLDL: 17 mg/dL (ref 0.0–40.0)

## 2015-07-04 LAB — BASIC METABOLIC PANEL
BUN: 19 mg/dL (ref 6–23)
CALCIUM: 9.3 mg/dL (ref 8.4–10.5)
CO2: 25 mEq/L (ref 19–32)
Chloride: 102 mEq/L (ref 96–112)
Creatinine, Ser: 0.9 mg/dL (ref 0.40–1.50)
GFR: 84.32 mL/min (ref >60.00)
Glucose, Bld: 101 mg/dL — ABNORMAL HIGH (ref 70–99)
POTASSIUM: 4.1 meq/L (ref 3.5–5.1)
SODIUM: 138 meq/L (ref 135–145)

## 2015-07-04 LAB — CBC
HEMATOCRIT: 40.3 % (ref 39.0–52.0)
Hemoglobin: 13.6 g/dL (ref 13.0–17.0)
MCHC: 33.7 g/dL (ref 30.0–36.0)
MCV: 85.2 fl (ref 78.0–100.0)
Platelets: 169 10*3/uL (ref 150.0–400.0)
RBC: 4.73 Mil/uL (ref 4.22–5.81)
RDW: 14.9 % (ref 11.5–15.5)
WBC: 20.9 10*3/uL (ref 4.0–10.5)

## 2015-07-04 LAB — TSH: TSH: 7.32 u[IU]/mL — ABNORMAL HIGH (ref 0.35–4.50)

## 2015-07-04 MED ORDER — RANITIDINE HCL 150 MG PO TABS: 150.0000 mg | ORAL_TABLET | Freq: Two times a day (BID) | ORAL | Status: DC

## 2015-07-04 MED ORDER — CEPHALEXIN 250 MG PO CAPS: 250.0000 mg | ORAL_CAPSULE | Freq: Two times a day (BID) | ORAL | Status: DC

## 2015-07-04 NOTE — Telephone Encounter (Signed)
`  Please call to inform patient and wife of elevated WBC count. Likely due to recent skin infection that had seemed to be resolving. Giving WBC count, send in Rx Keflex 250 mg BID x 7 days. Have him follow-up with me on Friday. If he develops fever, he is to go to the ER.

## 2015-07-04 NOTE — Telephone Encounter (Signed)
[facility] called reporting critical -- patients white count ([phone])

## 2015-07-04 NOTE — Progress Notes (Signed)
Patient presents to clinic today c/o heartburn and increased gas over the past few weeks. Endorses an episode of emesis 1 week ago after severe episode of heartburn. Has not been taking anything for symptoms. Denies fever, chills, malaise, hematochezia, tenesmus. Endorses one stool that was slightly darker than normal but denies melena.  Patient also c/o insect bite to left jawbone that was initially swollen, red and very painful. Endorses swelling has reduced and pain has resolved. Denies arthralgias, fever or chills.  Patient is also due for repeat TSH and lipid testing. Will obtain today as he is fasting.  Past Medical History  Diagnosis Date  . Stroke [facility]) Nov 2011  . Diabetes type 2, controlled ([place]) 2000  . History of chicken pox   . Whooping cough   . Mumps     Adult  . A-fib ([place])   . Cataracts, bilateral   . Retinopathy   . Undescended testicle, unilateral     Right    Current Outpatient Prescriptions on File Prior to Visit  Medication Sig Dispense Refill  . Ascorbic Acid (VITAMIN C) 1000 MG tablet Take 1,000 mg by mouth daily.    Marland Kitchen aspirin 81 MG tablet Take 1 tablet (81 mg total) by mouth daily.    . Calcium Citrate 200 MG TABS Take 1 tablet by mouth daily.    . Cholecalciferol (VITAMIN D-3) 1000 UNITS CAPS Take 1 capsule by mouth 2 (two) times daily.    Marland Kitchen docusate sodium (COLACE) 100 MG capsule Take 100 mg by mouth daily.    . Eyelid Cleansers (STERILID EX) Apply topically. Eye Wash: Once Daily    . glipiZIDE (GLUCOTROL) 5 MG tablet Take [2] in Am after B'fast and [1] in PM after Dinner 270 tablet 1  . ketoconazole (NIZORAL) 2 % shampoo Apply topically.    Marland Kitchen lisinopril (PRINIVIL,ZESTRIL) 2.5 MG tablet Take 1 tablet (2.5 mg total) by mouth daily. 90 tablet 3  . loratadine (SM LORATADINE) 5 MG/5ML syrup Take by mouth as needed.    . lovastatin (MEVACOR) 10 MG tablet Take 1 tablet (10 mg total) by mouth at bedtime. 90 tablet 1  . metFORMIN (GLUCOPHAGE-XR) 500 MG 24 hr  tablet Take 1 tablet (500 mg total) by mouth 2 (two) times daily with a meal. [2] in AM with B'fast 180 tablet 1  . metoprolol tartrate (LOPRESSOR) 25 MG tablet Take 0.5 tablets (12.5 mg total) by mouth 2 (two) times daily. 180 tablet 1  . Miconazole Nitrate (TRIPLE PASTE AF EX) Apply topically. Jock Itch Cream: 1-2 Times Daily As Needed    . Multiple Vitamins-Minerals (CENTRUM SILVER ULTRA MENS) TABS Take 1 tablet by mouth daily.    . Omega-3 Fatty Acids (SB OMEGA-3 FISH OIL) 1000 MG CAPS Take by mouth.    Marland Kitchen OVER THE COUNTER MEDICATION Take 1 each by mouth 2 (two) times daily. Focus Select Supplement: Vitamin C & E, Zinc & Copper    . saw palmetto 160 MG capsule Take 160 mg by mouth daily. Saw Palmetto Berry Extract: Prostate    . Sennosides (LAXATIVE PILLS PO) Take 2 capsules by mouth daily. GENTLE MOVE     No current facility-administered medications on file prior to visit.    Allergies  Allergen Reactions  . Shellfish Allergy Anaphylaxis  . Other Other (See Comments)    Blood Thinner given in Rehab gave H/As - Not Coumadin     Family History  Problem Relation Age of Onset  . Pneumonia Mother 36  Deceased  . GI Bleed Father 62    Deceased - Ulcers  . Diabetes Cousin   . Diabetes Brother   . Cancer Paternal Aunt     Social History   Social History  . Marital Status: Married    Spouse Name: N/A  . Number of Children: N/A  . Years of Education: N/A   Social History Main Topics  . Smoking status: Never Smoker   . Smokeless tobacco: None     Comment: Hasn't smoked since age 77  . Alcohol Use: None  . Drug Use: None  . Sexual Activity: Not Asked   Other Topics Concern  . None   Social History Narrative   Review of Systems - See HPI.  All other ROS are negative.  BP 129/53 mmHg  Pulse 62  Resp 16  Ht '5\' 6"'  (1.676 m)  Wt 177 lb 4 oz (80.4 kg)  BMI 28.62 kg/m2  SpO2 98%  Physical Exam  Constitutional: He is oriented to person, place, and time and  well-developed, well-nourished, and in no distress.  HENT:  Head: Normocephalic and atraumatic.  Eyes: Conjunctivae are normal.  Neck: Neck supple.  Cardiovascular: Normal rate, regular rhythm, normal heart sounds and intact distal pulses.   Pulmonary/Chest: Effort normal and breath sounds normal. No respiratory distress. He has no wheezes. He has no rales. He exhibits no tenderness.  Abdominal: Soft. Bowel sounds are normal. He exhibits no distension and no mass. There is no tenderness. There is no rebound and no guarding.  Lymphadenopathy:    He has no cervical adenopathy.  Neurological: He is alert and oriented to person, place, and time.  Skin: Skin is warm and dry. No rash noted.     Psychiatric: Affect normal.  Vitals reviewed.   Recent Results (from the past 2160 hour(s))  CBC     Status: None   Collection Time: 05/24/15  3:43 PM  Result Value Ref Range   WBC 8.9 4.0 - 10.5 K/uL   RBC 4.61 4.22 - 5.81 Mil/uL   Platelets 210.0 150.0 - 400.0 K/uL   Hemoglobin 13.2 13.0 - 17.0 g/dL   HCT 39.3 39.0 - 52.0 %   MCV 85.3 78.0 - 100.0 fl   MCHC 33.5 30.0 - 36.0 g/dL   RDW 14.6 11.5 - 15.5 %  Comp Met (CMET)     Status: Abnormal   Collection Time: 05/24/15  3:43 PM  Result Value Ref Range   Sodium 138 135 - 145 mEq/L   Potassium 4.7 3.5 - 5.1 mEq/L   Chloride 103 96 - 112 mEq/L   CO2 25 19 - 32 mEq/L   Glucose, Bld 159 (H) 70 - 99 mg/dL   BUN 19 6 - 23 mg/dL   Creatinine, Ser 0.97 0.40 - 1.50 mg/dL   Total Bilirubin 0.4 0.2 - 1.2 mg/dL   Alkaline Phosphatase 65 39 - 117 U/L   AST 19 0 - 37 U/L   ALT 20 0 - 53 U/L   Total Protein 7.1 6.0 - 8.3 g/dL   Albumin 4.0 3.5 - 5.2 g/dL   Calcium 9.6 8.4 - 10.5 mg/dL   GFR 77.36 >60.00 mL/min  TSH     Status: None   Collection Time: 05/24/15  3:43 PM  Result Value Ref Range   TSH 3.38 0.35 - 4.50 uIU/mL  Hemoglobin A1c     Status: Abnormal   Collection Time: 05/24/15  3:43 PM  Result Value Ref Range   Hgb A1c  MFr Bld 7.8 (H)  4.6 - 6.5 %    Comment: Glycemic Control Guidelines for People with Diabetes:Non Diabetic:  <6%Goal of Therapy: <7%Additional Action Suggested:  >7%   Basic Metabolic Panel (BMET)     Status: Abnormal   Collection Time: 06/09/15 12:09 PM  Result Value Ref Range   Sodium 133 (L) 135 - 145 mEq/L   Potassium 4.3 3.5 - 5.1 mEq/L   Chloride 97 96 - 112 mEq/L   CO2 24 19 - 32 mEq/L   Glucose, Bld 225 (H) 70 - 99 mg/dL   BUN 28 (H) 6 - 23 mg/dL   Creatinine, Ser 1.01 0.40 - 1.50 mg/dL   Calcium 9.6 8.4 - 10.5 mg/dL   GFR 73.83 >60.00 mL/min  CBC     Status: Abnormal   Collection Time: 07/04/15  8:12 AM  Result Value Ref Range   WBC 20.9 (HH) 4.0 - 10.5 K/uL   RBC 4.73 4.22 - 5.81 Mil/uL   Platelets 169.0 150.0 - 400.0 K/uL   Hemoglobin 13.6 13.0 - 17.0 g/dL   HCT 40.3 39.0 - 52.0 %   MCV 85.2 78.0 - 100.0 fl   MCHC 33.7 30.0 - 36.0 g/dL   RDW 14.9 11.5 - 07.8 %  Basic Metabolic Panel (BMET)     Status: Abnormal   Collection Time: 07/04/15  8:12 AM  Result Value Ref Range   Sodium 138 135 - 145 mEq/L   Potassium 4.1 3.5 - 5.1 mEq/L   Chloride 102 96 - 112 mEq/L   CO2 25 19 - 32 mEq/L   Glucose, Bld 101 (H) 70 - 99 mg/dL   BUN 19 6 - 23 mg/dL   Creatinine, Ser 0.90 0.40 - 1.50 mg/dL   Calcium 9.3 8.4 - 10.5 mg/dL   GFR 84.32 >60.00 mL/min  TSH     Status: Abnormal   Collection Time: 07/04/15  8:12 AM  Result Value Ref Range   TSH 7.32 (H) 0.35 - 4.50 uIU/mL  Lipid Profile     Status: Abnormal   Collection Time: 07/04/15  8:12 AM  Result Value Ref Range   Cholesterol 95 0 - 200 mg/dL    Comment: ATP III Classification       Desirable:  < 200 mg/dL               Borderline High:  200 - 239 mg/dL          High:  > = 240 mg/dL   Triglycerides 85.0 0.0 - 149.0 mg/dL    Comment: Normal:  <150 mg/dLBorderline High:  150 - 199 mg/dL   HDL 33.30 (L) >39.00 mg/dL   VLDL 17.0 0.0 - 40.0 mg/dL   LDL Cholesterol 45 0 - 99 mg/dL   Total CHOL/HDL Ratio 3     Comment:                Men           Women1/2 Average Risk     3.4          3.3Average Risk          5.0          4.42X Average Risk          9.6          7.13X Average Risk          15.0          11.0  NonHDL 61.89     Comment: NOTE:  Non-HDL goal should be 30 mg/dL higher than patient's LDL goal (i.e. LDL goal of < 70 mg/dL, would have non-HDL goal of < 100 mg/dL)    Assessment/Plan: Hyperlipidemia Will repeat fasting lipid panel today.  Elevated TSH Will repeat TSH today.  Gastroesophageal reflux disease without esophagitis Discussed treatment options. Wife declines PPI therapy after discussion of side effects. Will begin Zantac 150 mg BID. GERD diet discussed. Follow-up 2 weeks.  Cellulitis of face Seems to be resolving on its own. Afebrile. No erythema, warmth or tenderness on exam. Only residual induration. Will check CBC today to assess WBC count. If any elevation, will treat with ABX but otherwise patient to follow supportive measures given.

## 2015-07-04 NOTE — Patient Instructions (Signed)
Please increase fluids.  Take Zantac as directed. Start a daily probiotic (Align, Digestive Advantage, Culturelle) Limit spicy foods.  Follow-up in 2 weeks.  I will call you with all of your lab results. For the area around the ear, it seems infection has resolved. Please assess daily to make sure the area continues to return to normal. If you notice any redness, heat, or tenderness please call me.

## 2015-07-04 NOTE — Telephone Encounter (Signed)
LMOM [[place], spouse] with contact name and number for return call RE: results and new Rx ABX for skin infection, needs f/u appt on Friday and further provider imstructions/SLS

## 2015-07-04 NOTE — Progress Notes (Signed)
Pre visit review using our clinic review tool, if applicable. No additional management support is needed unless otherwise documented below in the visit note/SLS  

## 2015-07-06 ENCOUNTER — Other Ambulatory Visit: Payer: Self-pay | Admitting: Physician Assistant

## 2015-07-06 MED ORDER — LEVOTHYROXINE SODIUM 50 MCG PO TABS: 50.0000 ug | ORAL_TABLET | Freq: Every day | ORAL | Status: DC

## 2015-07-09 DIAGNOSIS — R7989 Other specified abnormal findings of blood chemistry: Secondary | ICD-10-CM | POA: Insufficient documentation

## 2015-07-09 DIAGNOSIS — K219 Gastro-esophageal reflux disease without esophagitis: Secondary | ICD-10-CM | POA: Insufficient documentation

## 2015-07-09 DIAGNOSIS — L03211 Cellulitis of face: Secondary | ICD-10-CM | POA: Insufficient documentation

## 2015-07-09 NOTE — Assessment & Plan Note (Signed)
Discussed treatment options. Wife declines PPI therapy after discussion of side effects. Will begin Zantac 150 mg BID. GERD diet discussed. Follow-up 2 weeks.

## 2015-07-09 NOTE — Assessment & Plan Note (Signed)
Will repeat TSH today 

## 2015-07-09 NOTE — Assessment & Plan Note (Signed)
Will repeat fasting lipid panel today. 

## 2015-07-09 NOTE — Assessment & Plan Note (Signed)
Seems to be resolving on its own. Afebrile. No erythema, warmth or tenderness on exam. Only residual induration. Will check CBC today to assess WBC count. If any elevation, will treat with ABX but otherwise patient to follow supportive measures given.

## 2015-07-18 ENCOUNTER — Ambulatory Visit (INDEPENDENT_AMBULATORY_CARE_PROVIDER_SITE_OTHER): Payer: Medicare Other | Admitting: Physician Assistant

## 2015-07-18 ENCOUNTER — Encounter: Payer: Self-pay | Admitting: Physician Assistant

## 2015-07-18 VITALS — BP 131/59 | HR 60 | Resp 14 | Ht 66.0 in | Wt 177.4 lb

## 2015-07-18 DIAGNOSIS — D72829 Elevated white blood cell count, unspecified: Secondary | ICD-10-CM | POA: Diagnosis not present

## 2015-07-18 DIAGNOSIS — L03211 Cellulitis of face: Secondary | ICD-10-CM

## 2015-07-18 LAB — CBC WITH DIFFERENTIAL/PLATELET
BASOS PCT: 0.7 % (ref 0.0–3.0)
Basophils Absolute: 0.1 10*3/uL (ref 0.0–0.1)
Eosinophils Absolute: 2.8 10*3/uL — ABNORMAL HIGH (ref 0.0–0.7)
HEMATOCRIT: 39.7 % (ref 39.0–52.0)
HEMOGLOBIN: 13.2 g/dL (ref 13.0–17.0)
Lymphocytes Relative: 26.7 % (ref 12.0–46.0)
Lymphs Abs: 2.8 10*3/uL (ref 0.7–4.0)
MCHC: 33.3 g/dL (ref 30.0–36.0)
MCV: 86.7 fl (ref 78.0–100.0)
MONO ABS: 0.5 10*3/uL (ref 0.1–1.0)
MONOS PCT: 4.8 % (ref 3.0–12.0)
Neutro Abs: 4.2 10*3/uL (ref 1.4–7.7)
Neutrophils Relative %: 41 % — ABNORMAL LOW (ref 43.0–77.0)
Platelets: 215 10*3/uL (ref 150.0–400.0)
RBC: 4.58 Mil/uL (ref 4.22–5.81)
RDW: 15.6 % — AB (ref 11.5–15.5)
WBC: 10.3 10*3/uL (ref 4.0–10.5)

## 2015-07-18 MED ORDER — LEVOTHYROXINE SODIUM 50 MCG PO TABS: 50.0000 ug | ORAL_TABLET | Freq: Every day | ORAL | Status: DC

## 2015-07-18 NOTE — Assessment & Plan Note (Signed)
Will repeat CBC today now that infection is clinically resolved.

## 2015-07-18 NOTE — Patient Instructions (Signed)
Please go to the lab for blood work. I will call with your results. Please continue the Zantac as directed. Start a daily probiotic. Restart the thyroid medication as directed.  Follow-up in 2 months.

## 2015-07-18 NOTE — Progress Notes (Signed)
Pre visit review using our clinic review tool, if applicable. No additional management support is needed unless otherwise documented below in the visit note/SLS  

## 2015-07-18 NOTE — Assessment & Plan Note (Signed)
Resolved clinically. Asymptomatic. Will repeat CBC today due to leukocytosis on last check.

## 2015-07-18 NOTE — Progress Notes (Signed)
Patient presents to clinic today 2 week follow-up of facial cellulitis with leukocytosis. Patient endorses completing antibiotic without side effect. Denies residual pain, redness or warmth. Denies fever or chills.   Past Medical History  Diagnosis Date  . Stroke [facility]) Nov 2011  . Diabetes type 2, controlled ([place]) 2000  . History of chicken pox   . Whooping cough   . Mumps     Adult  . A-fib ([place])   . Cataracts, bilateral   . Retinopathy   . Undescended testicle, unilateral     Right    Current Outpatient Prescriptions on File Prior to Visit  Medication Sig Dispense Refill  . Ascorbic Acid (VITAMIN C) 1000 MG tablet Take 1,000 mg by mouth daily.    Marland Kitchen aspirin 81 MG tablet Take 1 tablet (81 mg total) by mouth daily.    . Calcium Citrate 200 MG TABS Take 1 tablet by mouth daily.    . Cholecalciferol (VITAMIN D-3) 1000 UNITS CAPS Take 1 capsule by mouth 2 (two) times daily.    Marland Kitchen docusate sodium (COLACE) 100 MG capsule Take 100 mg by mouth daily.    . Eyelid Cleansers (STERILID EX) Apply topically. Eye Wash: Once Daily    . glipiZIDE (GLUCOTROL) 5 MG tablet Take [2] in Am after B'fast and [1] in PM after Dinner 270 tablet 1  . ketoconazole (NIZORAL) 2 % shampoo Apply topically.    Marland Kitchen lisinopril (PRINIVIL,ZESTRIL) 2.5 MG tablet Take 1 tablet (2.5 mg total) by mouth daily. 90 tablet 3  . loratadine (SM LORATADINE) 5 MG/5ML syrup Take by mouth as needed.    . lovastatin (MEVACOR) 10 MG tablet Take 1 tablet (10 mg total) by mouth at bedtime. 90 tablet 1  . metoprolol tartrate (LOPRESSOR) 25 MG tablet Take 0.5 tablets (12.5 mg total) by mouth 2 (two) times daily. 180 tablet 1  . Miconazole Nitrate (TRIPLE PASTE AF EX) Apply topically. Jock Itch Cream: 1-2 Times Daily As Needed    . Multiple Vitamins-Minerals (CENTRUM SILVER ULTRA MENS) TABS Take 1 tablet by mouth daily.    . Omega-3 Fatty Acids (SB OMEGA-3 FISH OIL) 1000 MG CAPS Take by mouth.    Marland Kitchen OVER THE COUNTER MEDICATION Take 1 each  by mouth 2 (two) times daily. Focus Select Supplement: Vitamin C & E, Zinc & Copper    . ranitidine (ZANTAC) 150 MG tablet Take 1 tablet (150 mg total) by mouth 2 (two) times daily. 60 tablet 1  . saw palmetto 160 MG capsule Take 160 mg by mouth daily. Saw Palmetto Berry Extract: Prostate    . Sennosides (LAXATIVE PILLS PO) Take 1-2 capsules by mouth daily. GENTLE MOVE    . metFORMIN (GLUCOPHAGE-XR) 500 MG 24 hr tablet Take 1 tablet (500 mg total) by mouth 2 (two) times daily with a meal. [2] in AM with B'fast 180 tablet 1   No current facility-administered medications on file prior to visit.    Allergies  Allergen Reactions  . Shellfish Allergy Anaphylaxis  . Other Other (See Comments)    Blood Thinner given in Rehab gave H/As - Not Coumadin     Family History  Problem Relation Age of Onset  . Pneumonia Mother 6    Deceased  . GI Bleed Father 35    Deceased - Ulcers  . Diabetes Cousin   . Diabetes Brother   . Cancer Paternal Aunt     Social History   Social History  . Marital Status: Married  Spouse Name: N/A  . Number of Children: N/A  . Years of Education: N/A   Social History Main Topics  . Smoking status: Never Smoker   . Smokeless tobacco: None     Comment: Hasn't smoked since age 61  . Alcohol Use: None  . Drug Use: None  . Sexual Activity: Not Asked   Other Topics Concern  . None   Social History Narrative   Review of Systems - See HPI.  All other ROS are negative.  BP 131/59 mmHg  Pulse 60  Resp 14  Ht _0  (1.676 m)  Wt 177 lb 6 oz (80.457 kg)  BMI 28.64 kg/m2  SpO2 97%  Physical Exam  Constitutional: He is oriented to person, place, and time and well-developed, well-nourished, and in no distress.  HENT:  Head: Normocephalic and atraumatic.  Eyes: Conjunctivae are normal.  Neck: Neck supple.  Cardiovascular: Normal rate, regular rhythm, normal heart sounds and intact distal pulses.   Pulmonary/Chest: Effort normal and breath sounds  normal. No respiratory distress. He has no wheezes. He has no rales. He exhibits no tenderness.  Neurological: He is alert and oriented to person, place, and time.  Skin: Skin is warm and dry. No rash noted.  Psychiatric: Affect normal.  Vitals reviewed.   Recent Results (from the past 2160 hour(s))  CBC     Status: None   Collection Time: 05/24/15  3:43 PM  Result Value Ref Range   WBC 8.9 4.0 - 10.5 K/uL   RBC 4.61 4.22 - 5.81 Mil/uL   Platelets 210.0 150.0 - 400.0 K/uL   Hemoglobin 13.2 13.0 - 17.0 g/dL   HCT 39.3 39.0 - 52.0 %   MCV 85.3 78.0 - 100.0 fl   MCHC 33.5 30.0 - 36.0 g/dL   RDW 14.6 11.5 - 15.5 %  Comp Met (CMET)     Status: Abnormal   Collection Time: 05/24/15  3:43 PM  Result Value Ref Range   Sodium 138 135 - 145 mEq/L   Potassium 4.7 3.5 - 5.1 mEq/L   Chloride 103 96 - 112 mEq/L   CO2 25 19 - 32 mEq/L   Glucose, Bld 159 (H) 70 - 99 mg/dL   BUN 19 6 - 23 mg/dL   Creatinine, Ser 0.97 0.40 - 1.50 mg/dL   Total Bilirubin 0.4 0.2 - 1.2 mg/dL   Alkaline Phosphatase 65 39 - 117 U/L   AST 19 0 - 37 U/L   ALT 20 0 - 53 U/L   Total Protein 7.1 6.0 - 8.3 g/dL   Albumin 4.0 3.5 - 5.2 g/dL   Calcium 9.6 8.4 - 10.5 mg/dL   GFR 77.36 >60.00 mL/min  TSH     Status: None   Collection Time: 05/24/15  3:43 PM  Result Value Ref Range   TSH 3.38 0.35 - 4.50 uIU/mL  Hemoglobin A1c     Status: Abnormal   Collection Time: 05/24/15  3:43 PM  Result Value Ref Range   Hgb A1c MFr Bld 7.8 (H) 4.6 - 6.5 %    Comment: Glycemic Control Guidelines for People with Diabetes:Non Diabetic:  <6%Goal of Therapy: <7%Additional Action Suggested:  >4%   Basic Metabolic Panel (BMET)     Status: Abnormal   Collection Time: 06/09/15 12:09 PM  Result Value Ref Range   Sodium 133 (L) 135 - 145 mEq/L   Potassium 4.3 3.5 - 5.1 mEq/L   Chloride 97 96 - 112 mEq/L   CO2 24 19 -  32 mEq/L   Glucose, Bld 225 (H) 70 - 99 mg/dL   BUN 28 (H) 6 - 23 mg/dL   Creatinine, Ser 1.01 0.40 - 1.50 mg/dL    Calcium 9.6 8.4 - 10.5 mg/dL   GFR 73.83 >60.00 mL/min  CBC     Status: Abnormal   Collection Time: 07/04/15  8:12 AM  Result Value Ref Range   WBC 20.9 (HH) 4.0 - 10.5 K/uL   RBC 4.73 4.22 - 5.81 Mil/uL   Platelets 169.0 150.0 - 400.0 K/uL   Hemoglobin 13.6 13.0 - 17.0 g/dL   HCT 40.3 39.0 - 52.0 %   MCV 85.2 78.0 - 100.0 fl   MCHC 33.7 30.0 - 36.0 g/dL   RDW 14.9 11.5 - 44.9 %  Basic Metabolic Panel (BMET)     Status: Abnormal   Collection Time: 07/04/15  8:12 AM  Result Value Ref Range   Sodium 138 135 - 145 mEq/L   Potassium 4.1 3.5 - 5.1 mEq/L   Chloride 102 96 - 112 mEq/L   CO2 25 19 - 32 mEq/L   Glucose, Bld 101 (H) 70 - 99 mg/dL   BUN 19 6 - 23 mg/dL   Creatinine, Ser 0.90 0.40 - 1.50 mg/dL   Calcium 9.3 8.4 - 10.5 mg/dL   GFR 84.32 >60.00 mL/min  TSH     Status: Abnormal   Collection Time: 07/04/15  8:12 AM  Result Value Ref Range   TSH 7.32 (H) 0.35 - 4.50 uIU/mL  Lipid Profile     Status: Abnormal   Collection Time: 07/04/15  8:12 AM  Result Value Ref Range   Cholesterol 95 0 - 200 mg/dL    Comment: ATP III Classification       Desirable:  < 200 mg/dL               Borderline High:  200 - 239 mg/dL          High:  > = 240 mg/dL   Triglycerides 85.0 0.0 - 149.0 mg/dL    Comment: Normal:  <150 mg/dLBorderline High:  150 - 199 mg/dL   HDL 33.30 (L) >39.00 mg/dL   VLDL 17.0 0.0 - 40.0 mg/dL   LDL Cholesterol 45 0 - 99 mg/dL   Total CHOL/HDL Ratio 3     Comment:                Men          Women1/2 Average Risk     3.4          3.3Average Risk          5.0          4.42X Average Risk          9.6          7.13X Average Risk          15.0          11.0                       NonHDL 61.89     Comment: NOTE:  Non-HDL goal should be 30 mg/dL higher than patient's LDL goal (i.e. LDL goal of < 70 mg/dL, would have non-HDL goal of < 100 mg/dL)    Assessment/Plan: Cellulitis of face Resolved clinically. Asymptomatic. Will repeat CBC today due to leukocytosis on last  check.  Leukocytosis Will repeat CBC today now that infection is clinically resolved.

## 2015-07-20 ENCOUNTER — Encounter: Payer: Self-pay | Admitting: Physician Assistant

## 2015-07-21 ENCOUNTER — Other Ambulatory Visit: Payer: Self-pay | Admitting: Physician Assistant

## 2015-07-21 ENCOUNTER — Telehealth: Payer: Self-pay | Admitting: *Deleted

## 2015-07-21 DIAGNOSIS — D721 Eosinophilia: Principal | ICD-10-CM

## 2015-07-21 DIAGNOSIS — R899 Unspecified abnormal finding in specimens from other organs, systems and tissues: Secondary | ICD-10-CM

## 2015-07-21 DIAGNOSIS — R898 Other abnormal findings in specimens from other organs, systems and tissues: Secondary | ICD-10-CM

## 2015-07-21 NOTE — Telephone Encounter (Signed)
Patient's wife informed, understood & agreed; lab appt scheduled for 11.11.16 at 10:45a/SLS

## 2015-07-21 NOTE — Telephone Encounter (Signed)
-----   Message from [name], PA-C sent at 07/19/2015 11:32 AM EDT ----- WBC has returned to normal which is a great finding. Eosinophil count has risen, potentially due to allergies. Recommend start claritin daily and we will repeat CBC in 2 weeks.

## 2015-08-04 ENCOUNTER — Other Ambulatory Visit (INDEPENDENT_AMBULATORY_CARE_PROVIDER_SITE_OTHER): Payer: Medicare Other

## 2015-08-04 ENCOUNTER — Encounter: Payer: Self-pay | Admitting: Physician Assistant

## 2015-08-04 DIAGNOSIS — D721 Eosinophilia: Secondary | ICD-10-CM

## 2015-08-04 DIAGNOSIS — R898 Other abnormal findings in specimens from other organs, systems and tissues: Secondary | ICD-10-CM

## 2015-08-04 LAB — CBC WITH DIFFERENTIAL/PLATELET
BASOS PCT: 0.5 % (ref 0.0–3.0)
Basophils Absolute: 0 10*3/uL (ref 0.0–0.1)
EOS ABS: 1.3 10*3/uL — AB (ref 0.0–0.7)
Eosinophils Relative: 13.9 % — ABNORMAL HIGH (ref 0.0–5.0)
HCT: 39.4 % (ref 39.0–52.0)
HEMOGLOBIN: 13.1 g/dL (ref 13.0–17.0)
Lymphocytes Relative: 23 % (ref 12.0–46.0)
Lymphs Abs: 2.1 10*3/uL (ref 0.7–4.0)
MCHC: 33.2 g/dL (ref 30.0–36.0)
MCV: 86.4 fl (ref 78.0–100.0)
Monocytes Absolute: 0.7 10*3/uL (ref 0.1–1.0)
Monocytes Relative: 7.5 % (ref 3.0–12.0)
NEUTROS ABS: 5 10*3/uL (ref 1.4–7.7)
Neutrophils Relative %: 55.1 % (ref 43.0–77.0)
PLATELETS: 206 10*3/uL (ref 150.0–400.0)
RBC: 4.56 Mil/uL (ref 4.22–5.81)
RDW: 15.9 % — AB (ref 11.5–15.5)
WBC: 9.1 10*3/uL (ref 4.0–10.5)

## 2015-08-24 DIAGNOSIS — M5412 Radiculopathy, cervical region: Secondary | ICD-10-CM | POA: Diagnosis not present

## 2015-08-24 DIAGNOSIS — M25512 Pain in left shoulder: Secondary | ICD-10-CM | POA: Diagnosis not present

## 2015-08-24 DIAGNOSIS — M9901 Segmental and somatic dysfunction of cervical region: Secondary | ICD-10-CM | POA: Diagnosis not present

## 2015-08-28 ENCOUNTER — Other Ambulatory Visit (INDEPENDENT_AMBULATORY_CARE_PROVIDER_SITE_OTHER): Payer: Medicare Other

## 2015-08-28 DIAGNOSIS — D721 Eosinophilia: Secondary | ICD-10-CM

## 2015-08-28 DIAGNOSIS — R898 Other abnormal findings in specimens from other organs, systems and tissues: Secondary | ICD-10-CM

## 2015-08-29 ENCOUNTER — Encounter: Payer: Self-pay | Admitting: Physician Assistant

## 2015-08-29 LAB — CBC WITH DIFFERENTIAL/PLATELET
BASOS ABS: 0.1 10*3/uL (ref 0.0–0.1)
Basophils Relative: 1 % (ref 0.0–3.0)
EOS ABS: 0.7 10*3/uL (ref 0.0–0.7)
Eosinophils Relative: 7.4 % — ABNORMAL HIGH (ref 0.0–5.0)
HCT: 39.3 % (ref 39.0–52.0)
Hemoglobin: 13 g/dL (ref 13.0–17.0)
LYMPHS ABS: 2.7 10*3/uL (ref 0.7–4.0)
Lymphocytes Relative: 29.9 % (ref 12.0–46.0)
MCHC: 33 g/dL (ref 30.0–36.0)
MCV: 87.7 fl (ref 78.0–100.0)
MONO ABS: 0.6 10*3/uL (ref 0.1–1.0)
Monocytes Relative: 6.4 % (ref 3.0–12.0)
NEUTROS ABS: 5 10*3/uL (ref 1.4–7.7)
NEUTROS PCT: 55.3 % (ref 43.0–77.0)
PLATELETS: 258 10*3/uL (ref 150.0–400.0)
RBC: 4.49 Mil/uL (ref 4.22–5.81)
RDW: 15.7 % — ABNORMAL HIGH (ref 11.5–15.5)
WBC: 9 10*3/uL (ref 4.0–10.5)

## 2015-09-01 DIAGNOSIS — H353122 Nonexudative age-related macular degeneration, left eye, intermediate dry stage: Secondary | ICD-10-CM | POA: Diagnosis not present

## 2015-09-01 DIAGNOSIS — H524 Presbyopia: Secondary | ICD-10-CM | POA: Diagnosis not present

## 2015-09-01 DIAGNOSIS — H353111 Nonexudative age-related macular degeneration, right eye, early dry stage: Secondary | ICD-10-CM | POA: Diagnosis not present

## 2015-09-01 DIAGNOSIS — H04123 Dry eye syndrome of bilateral lacrimal glands: Secondary | ICD-10-CM | POA: Diagnosis not present

## 2015-09-14 ENCOUNTER — Encounter: Payer: Self-pay | Admitting: Physician Assistant

## 2015-09-15 ENCOUNTER — Ambulatory Visit: Payer: Medicare Other | Admitting: Physician Assistant

## 2015-09-15 ENCOUNTER — Telehealth: Payer: Self-pay | Admitting: Physician Assistant

## 2015-09-15 NOTE — Telephone Encounter (Signed)
Patient left voicemail at 7:06am cancelling appointment charge or no charge

## 2015-09-15 NOTE — Telephone Encounter (Signed)
No charge. Sent a MyChart late last night

## 2015-09-25 ENCOUNTER — Encounter: Payer: Self-pay | Admitting: Physician Assistant

## 2015-09-27 ENCOUNTER — Telehealth: Payer: Self-pay | Admitting: *Deleted

## 2015-09-27 NOTE — Telephone Encounter (Signed)
See telephone encounter for 09/26/15./AB/CMA

## 2015-09-27 NOTE — Telephone Encounter (Signed)
Called and [facility] @ 4:36pm @ [phone]) asking the pt's wife Gerald Holt) to RTC regarding note below on the pt's medication.//AB/CMA

## 2015-09-27 NOTE — Telephone Encounter (Signed)
Hi Cody! Hope you had a great holiday! I was renewing Trestin's perscriptions online through [facility]) and apparently there are no refills left for Levothyroxine 4mcg tab ... Quantity 90 ... Days Supply 90 ... renewable 4 times (1 year). For some reason the message being displayed on the webpage says it is not eligible for auto refill. I know I have auto refilled this in the past so maybe it was the way the prescription was written. If you can renew this prescription without seeing Carrington, that would be great. But if you need to see him first, please let me know and we will make an appointment for him. Thank you! Ellie

## 2015-09-28 NOTE — Telephone Encounter (Signed)
Spouse returning your call best # [phone]

## 2015-09-29 NOTE — Telephone Encounter (Signed)
Called and spoke with the pt's wife and informed

## 2015-10-06 ENCOUNTER — Ambulatory Visit (INDEPENDENT_AMBULATORY_CARE_PROVIDER_SITE_OTHER): Payer: Medicare Other | Admitting: Physician Assistant

## 2015-10-06 ENCOUNTER — Encounter: Payer: Self-pay | Admitting: Physician Assistant

## 2015-10-06 VITALS — BP 137/59 | HR 65 | Temp 94.5°F | Ht 66.0 in | Wt 169.2 lb

## 2015-10-06 DIAGNOSIS — E1165 Type 2 diabetes mellitus with hyperglycemia: Secondary | ICD-10-CM | POA: Diagnosis not present

## 2015-10-06 DIAGNOSIS — IMO0001 Reserved for inherently not codable concepts without codable children: Secondary | ICD-10-CM

## 2015-10-06 DIAGNOSIS — R7989 Other specified abnormal findings of blood chemistry: Secondary | ICD-10-CM

## 2015-10-06 DIAGNOSIS — R946 Abnormal results of thyroid function studies: Secondary | ICD-10-CM | POA: Diagnosis not present

## 2015-10-06 LAB — BASIC METABOLIC PANEL
BUN: 17 mg/dL (ref 6–23)
CALCIUM: 9.7 mg/dL (ref 8.4–10.5)
CO2: 26 mEq/L (ref 19–32)
Chloride: 103 mEq/L (ref 96–112)
Creatinine, Ser: 0.89 mg/dL (ref 0.40–1.50)
GFR: 85.37 mL/min (ref >60.00)
GLUCOSE: 93 mg/dL (ref 70–99)
POTASSIUM: 4.1 meq/L (ref 3.5–5.1)
SODIUM: 140 meq/L (ref 135–145)

## 2015-10-06 LAB — HEMOGLOBIN A1C: Hgb A1c MFr Bld: 5.3 % (ref 4.6–6.5)

## 2015-10-06 LAB — TSH: TSH: 3.84 u[IU]/mL (ref 0.35–4.50)

## 2015-10-06 NOTE — Assessment & Plan Note (Signed)
Will repeat BMP and A1C today. Continue ACEI therapy. Foot exam completed. OP exam updated in [facility].

## 2015-10-06 NOTE — Progress Notes (Signed)
Patient presents to clinic today for follow-up of DM II, currently on Metformin and Glipizide daily. Is not checking fasting sugars. Endorses appointment with Bing Plume 08/2015 positive for macular degeneration but no sign of retinopathy. Patient is overdue for foot examination. Is currently on lisinopril 2.5 mg daily.  BP Readings from Last 3 Encounters:  10/06/15 137/59  07/18/15 131/59  07/04/15 129/53   Past Medical History  Diagnosis Date  . Stroke [facility]) Nov 2011  . Diabetes type 2, controlled ([place]) 2000  . History of chicken pox   . Whooping cough   . Mumps     Adult  . A-fib ([place])   . Cataracts, bilateral   . Retinopathy   . Undescended testicle, unilateral     Right    Current Outpatient Prescriptions on File Prior to Visit  Medication Sig Dispense Refill  . aspirin 81 MG tablet Take 1 tablet (81 mg total) by mouth daily.    Marland Kitchen docusate sodium (COLACE) 100 MG capsule Take 100 mg by mouth daily.    . Eyelid Cleansers (STERILID EX) Apply topically. Eye Wash: Once Daily    . glipiZIDE (GLUCOTROL) 5 MG tablet Take [2] in Am after B'fast and [1] in PM after Dinner 270 tablet 1  . levothyroxine (SYNTHROID, LEVOTHROID) 50 MCG tablet Take 1 tablet (50 mcg total) by mouth daily. 30 tablet 0  . lisinopril (PRINIVIL,ZESTRIL) 2.5 MG tablet Take 1 tablet (2.5 mg total) by mouth daily. 90 tablet 3  . lovastatin (MEVACOR) 10 MG tablet Take 1 tablet (10 mg total) by mouth at bedtime. 90 tablet 1  . metFORMIN (GLUCOPHAGE-XR) 500 MG 24 hr tablet Take 1 tablet (500 mg total) by mouth 2 (two) times daily with a meal. [2] in AM with B'fast 180 tablet 1  . metoprolol tartrate (LOPRESSOR) 25 MG tablet Take 0.5 tablets (12.5 mg total) by mouth 2 (two) times daily. 180 tablet 1  . OVER THE COUNTER MEDICATION Take 1 each by mouth 2 (two) times daily. Focus Select Supplement: Vitamin C & E, Zinc & Copper    . Calcium Citrate 200 MG TABS Take 1 tablet by mouth daily. Reported on 10/06/2015    .  Cholecalciferol (VITAMIN D-3) 1000 UNITS CAPS Take 1 capsule by mouth 2 (two) times daily. Reported on 10/06/2015    . ketoconazole (NIZORAL) 2 % shampoo Apply topically. Reported on 10/06/2015    . Omega-3 Fatty Acids (SB OMEGA-3 FISH OIL) 1000 MG CAPS Take by mouth. Reported on 10/06/2015     No current facility-administered medications on file prior to visit.    Allergies  Allergen Reactions  . Shellfish Allergy Anaphylaxis  . Aspirin-Dipyridamole Er   . Other Other (See Comments)    Blood Thinner given in Rehab gave H/As - Not Coumadin     Family History  Problem Relation Age of Onset  . Pneumonia Mother 69    Deceased  . GI Bleed Father 17    Deceased - Ulcers  . Diabetes Cousin   . Diabetes Brother   . Cancer Paternal Aunt     Social History   Social History  . Marital Status: Married    Spouse Name: N/A  . Number of Children: N/A  . Years of Education: N/A   Social History Main Topics  . Smoking status: Never Smoker   . Smokeless tobacco: None     Comment: Hasn't smoked since age 89  . Alcohol Use: None  . Drug Use: None  .  Sexual Activity: Not Asked   Other Topics Concern  . None   Social History Narrative    Review of Systems - See HPI.  All other ROS are negative.  BP 137/59 mmHg  Pulse 65  Temp(Src) 94.5 F (34.7 C) (Tympanic)  Ht 5\' 6"  (1.676 m)  Wt 169 lb 3.2 oz (76.749 kg)  BMI 27.32 kg/m2  SpO2 99%  Physical Exam  Constitutional: He is oriented to person, place, and time and well-developed, well-nourished, and in no distress.  HENT:  Head: Normocephalic and atraumatic.  Eyes: Conjunctivae are normal.  Cardiovascular: Normal rate, regular rhythm, normal heart sounds and intact distal pulses.   Pulmonary/Chest: Effort normal and breath sounds normal. No respiratory distress. He has no wheezes. He has no rales. He exhibits no tenderness.  Neurological: He is alert and oriented to person, place, and time.  Skin: Skin is warm and dry. No rash  noted.  Vitals reviewed.  Diabetic Foot Form - Detailed   Diabetic Foot Exam - detailed  Diabetic Foot exam was performed with the following findings:  Yes 10/06/2015  8:15 AM  Visual Foot Exam completed.:  Yes  Is there a history of foot ulcer?:  No  Can the patient see the bottom of their feet?:  No  Are the shoes appropriate in style and fit?:  Yes  Is there swelling or and abnormal foot shape?:  No  Are the toenails long?:  Yes  Are the toenails thick?:  No  Do you have pain in calf while walking?:  No  Is there a claw toe deformity?:  No  Is there elevated skin temparature?:  No  Is there limited skin dorsiflexion?:  No  Is there foot or ankle muscle weakness?:  No  Are the toenails ingrown?:  No  Normal Range of Motion:  Yes    Pulse Foot Exam completed.:  Yes  Right posterior Tibialias:  Present Left posterior Tibialias:  Present  Right Dorsalis Pedis:  Present Left Dorsalis Pedis:  Present  Sensory Foot Exam Completed.:  Yes  Swelling:  No  Semmes-Weinstein Monofilament Test        Recent Results (from the past 2160 hour(s))  CBC w/Diff     Status: Abnormal   Collection Time: 07/18/15 10:18 AM  Result Value Ref Range   WBC 10.3 4.0 - 10.5 K/uL   RBC 4.58 4.22 - 5.81 Mil/uL   Hemoglobin 13.2 13.0 - 17.0 g/dL   HCT 39.7 39.0 - 52.0 %   MCV 86.7 78.0 - 100.0 fl   MCHC 33.3 30.0 - 36.0 g/dL   RDW 15.6 (H) 11.5 - 15.5 %   Platelets 215.0 150.0 - 400.0 K/uL   Neutrophils Relative % 41.0 (L) 43.0 - 77.0 %   Lymphocytes Relative 26.7 12.0 - 46.0 %   Monocytes Relative 4.8 3.0 - 12.0 %   Eosinophils Relative 26.8 Repeated and verified X2. (H) 0.0 - 5.0 %   Basophils Relative 0.7 0.0 - 3.0 %   Neutro Abs 4.2 1.4 - 7.7 K/uL   Lymphs Abs 2.8 0.7 - 4.0 K/uL   Monocytes Absolute 0.5 0.1 - 1.0 K/uL   Eosinophils Absolute 2.8 (H) 0.0 - 0.7 K/uL   Basophils Absolute 0.1 0.0 - 0.1 K/uL  CBC w/Diff     Status: Abnormal   Collection Time: 08/04/15 10:51 AM  Result Value Ref  Range   WBC 9.1 4.0 - 10.5 K/uL   RBC 4.56 4.22 - 5.81 Mil/uL  Hemoglobin 13.1 13.0 - 17.0 g/dL   HCT 39.4 39.0 - 52.0 %   MCV 86.4 78.0 - 100.0 fl   MCHC 33.2 30.0 - 36.0 g/dL   RDW 15.9 (H) 11.5 - 15.5 %   Platelets 206.0 150.0 - 400.0 K/uL   Neutrophils Relative % 55.1 43.0 - 77.0 %   Lymphocytes Relative 23.0 12.0 - 46.0 %   Monocytes Relative 7.5 3.0 - 12.0 %   Eosinophils Relative 13.9 (H) 0.0 - 5.0 %   Basophils Relative 0.5 0.0 - 3.0 %   Neutro Abs 5.0 1.4 - 7.7 K/uL   Lymphs Abs 2.1 0.7 - 4.0 K/uL   Monocytes Absolute 0.7 0.1 - 1.0 K/uL   Eosinophils Absolute 1.3 (H) 0.0 - 0.7 K/uL   Basophils Absolute 0.0 0.0 - 0.1 K/uL  CBC w/Diff     Status: Abnormal   Collection Time: 08/28/15  3:22 PM  Result Value Ref Range   WBC 9.0 4.0 - 10.5 K/uL   RBC 4.49 4.22 - 5.81 Mil/uL   Hemoglobin 13.0 13.0 - 17.0 g/dL   HCT 39.3 39.0 - 52.0 %   MCV 87.7 78.0 - 100.0 fl   MCHC 33.0 30.0 - 36.0 g/dL   RDW 15.7 (H) 11.5 - 15.5 %   Platelets 258.0 150.0 - 400.0 K/uL   Neutrophils Relative % 55.3 43.0 - 77.0 %   Lymphocytes Relative 29.9 12.0 - 46.0 %   Monocytes Relative 6.4 3.0 - 12.0 %   Eosinophils Relative 7.4 (H) 0.0 - 5.0 %   Basophils Relative 1.0 0.0 - 3.0 %   Neutro Abs 5.0 1.4 - 7.7 K/uL   Lymphs Abs 2.7 0.7 - 4.0 K/uL   Monocytes Absolute 0.6 0.1 - 1.0 K/uL   Eosinophils Absolute 0.7 0.0 - 0.7 K/uL   Basophils Absolute 0.1 0.0 - 0.1 K/uL  TSH     Status: None   Collection Time: 10/06/15  9:04 AM  Result Value Ref Range   TSH 3.84 0.35 - 4.50 uIU/mL  Hemoglobin A1c     Status: None   Collection Time: 10/06/15  9:04 AM  Result Value Ref Range   Hgb A1c MFr Bld 5.3 4.6 - 6.5 %    Comment: Glycemic Control Guidelines for People with Diabetes:Non Diabetic:  <6%Goal of Therapy: <7%Additional Action Suggested:  123456   Basic Metabolic Panel (BMET)     Status: None   Collection Time: 10/06/15  9:04 AM  Result Value Ref Range   Sodium 140 135 - 145 mEq/L   Potassium 4.1 3.5  - 5.1 mEq/L   Chloride 103 96 - 112 mEq/L   CO2 26 19 - 32 mEq/L   Glucose, Bld 93 70 - 99 mg/dL   BUN 17 6 - 23 mg/dL   Creatinine, Ser 0.89 0.40 - 1.50 mg/dL   Calcium 9.7 8.4 - 10.5 mg/dL   GFR 85.37 >60.00 mL/min    Assessment/Plan: Diabetes mellitus type II, uncontrolled Will repeat BMP and A1C today. Continue ACEI therapy. Foot exam completed. OP exam updated in HM.   Elevated TSH Will obtain repeat TSH today. Continue medications as directed.

## 2015-10-06 NOTE — Assessment & Plan Note (Signed)
Will obtain repeat TSH today. Continue medications as directed.

## 2015-10-06 NOTE — Progress Notes (Signed)
Pre visit review using our clinic review tool, if applicable. No additional management support is needed unless otherwise documented below in the visit note. 

## 2015-10-06 NOTE — Patient Instructions (Signed)
Please go to the lab for blood work. I will call you with your results.  Please stay well hydrated -- be careful about eating so many soups as the salt worsens leg swelling.   Please go to [facility] and get some zip up compression stockings to wear during the day.   Continue your medications as directed. Restart a probiotic taking daily.

## 2015-10-07 ENCOUNTER — Encounter: Payer: Self-pay | Admitting: Physician Assistant

## 2015-10-07 ENCOUNTER — Other Ambulatory Visit: Payer: Self-pay | Admitting: Physician Assistant

## 2015-10-07 MED ORDER — GLIPIZIDE 5 MG PO TABS: 5.0000 mg | ORAL_TABLET | Freq: Every day | ORAL | Status: DC

## 2015-10-19 DIAGNOSIS — L219 Seborrheic dermatitis, unspecified: Secondary | ICD-10-CM | POA: Diagnosis not present

## 2015-10-19 DIAGNOSIS — Z85828 Personal history of other malignant neoplasm of skin: Secondary | ICD-10-CM | POA: Diagnosis not present

## 2015-10-19 DIAGNOSIS — L570 Actinic keratosis: Secondary | ICD-10-CM | POA: Diagnosis not present

## 2015-10-19 DIAGNOSIS — L578 Other skin changes due to chronic exposure to nonionizing radiation: Secondary | ICD-10-CM | POA: Diagnosis not present

## 2015-10-19 DIAGNOSIS — Z08 Encounter for follow-up examination after completed treatment for malignant neoplasm: Secondary | ICD-10-CM | POA: Diagnosis not present

## 2015-10-26 ENCOUNTER — Encounter: Payer: Self-pay | Admitting: Physician Assistant

## 2015-10-31 MED ORDER — METFORMIN HCL ER 500 MG PO TB24
500.0000 mg | ORAL_TABLET | Freq: Two times a day (BID) | ORAL | Status: DC
Start: 2015-10-31 — End: 2016-03-25

## 2015-10-31 MED ORDER — LEVOTHYROXINE SODIUM 50 MCG PO TABS: 50.0000 ug | ORAL_TABLET | Freq: Every day | ORAL | Status: DC

## 2015-10-31 MED ORDER — LOVASTATIN 10 MG PO TABS: 10.0000 mg | ORAL_TABLET | Freq: Every day | ORAL | Status: DC

## 2015-10-31 NOTE — Telephone Encounter (Signed)
Rx's sent to the pharmacy by e-script.//AB/CMA 

## 2015-11-28 ENCOUNTER — Telehealth: Payer: Self-pay | Admitting: Physician Assistant

## 2015-11-28 ENCOUNTER — Other Ambulatory Visit: Payer: Self-pay | Admitting: Family Medicine

## 2015-12-13 MED ORDER — LOVASTATIN 10 MG PO TABS: 10.0000 mg | ORAL_TABLET | Freq: Every day | ORAL | Status: DC

## 2015-12-13 NOTE — Addendum Note (Signed)
Addended by: Raiford Noble on: 12/13/2015 04:40 PM   Modules accepted: Orders

## 2015-12-13 NOTE — Telephone Encounter (Signed)
Pt's family member called in in regards to pt's Rx. She says that pt still hasn't received medication and per [facility] system no Rx are showing.    She would like further assistance.   Thanks

## 2015-12-13 NOTE — Telephone Encounter (Signed)
Spoke with patient's wife. Medication resent.  Please call the [facility] to confirm receipt and make sure they will dispense 90 at a time.

## 2015-12-25 ENCOUNTER — Ambulatory Visit (INDEPENDENT_AMBULATORY_CARE_PROVIDER_SITE_OTHER): Payer: Medicare Other | Admitting: Cardiology

## 2015-12-25 ENCOUNTER — Encounter: Payer: Self-pay | Admitting: Cardiology

## 2015-12-25 VITALS — BP 122/84 | HR 74 | Ht 67.0 in | Wt 170.8 lb

## 2015-12-25 DIAGNOSIS — I480 Paroxysmal atrial fibrillation: Secondary | ICD-10-CM

## 2015-12-25 NOTE — Patient Instructions (Signed)

## 2015-12-25 NOTE — Progress Notes (Signed)
Cardiology Office Note   Date:  12/25/2015   ID:  Gerald Holt, DOB Aug 09, 1924, MRN [id number]  PCP:  Leeanne Rio, PA-C  Cardiologist:   Candee Furbish, MD       History of Present Illness: Gerald Holt is a 82 y.o. male here for evaluation of atrial fibrillation, to establish care. He is a prior history of stroke, diabetes (hemoglobin A1c 7.8), hyperlipidemia.  AFIB was diagnosed in 1984. Chest pressure ([place]). Cold glass of OJ brought it on). He was doing some work around the house, was hospitalized for 4 days. Metoprolol started then.  1991 next episode, working around the house. Looked gray. Reverted quickly.   2011 next episode. Occurred at Night.   8/16 - this is his last episode which happened a few weeks ago at night had episode. 24 hours reverted. Started amiodarone. Prefer not to be on drugs if possible. Wife was very helpful with history. Has not been the same since then.   [place] five times this year. One of the falls occurred and was quite serious resulting in fractured ribs, fell into a ditch while trying to use a weedeater. Because of the fall, his anticoagulation was discontinued. Aspirin was started.   About to start PT.   Moved from [facility], saw [facility] Dr. Corrin Parker.   CHADS-VASc is at least 6.  Now off thyroid. Medication.  Stroke 2011 - was on anticoagulation for 5 years. Coumadin. Falls. ECHO - looked fine. Broke ribs, head.   No prior cardioversions.  12/25/15-overall he is doing better. No chest pain, no shortness of breath, no bleeding. He has not had any significant palpitations, no return of atrial fibrillation. He does have some weakness in his legs when getting up. Main complaint is sleeping many naps during the day.  Past Medical History  Diagnosis Date  . Stroke [facility]) Nov 2011  . Diabetes type 2, controlled ([place]) 2000  . History of chicken pox   . Whooping cough   . Mumps     Adult  . A-fib ([place])   .  Cataracts, bilateral   . Retinopathy   . Undescended testicle, unilateral     Right    Past Surgical History  Procedure Laterality Date  . Tooth extraction    . Pre-malignant / benign skin lesion excision    . Testicle surgery      Right, undescended     Current Outpatient Prescriptions  Medication Sig Dispense Refill  . aspirin 81 MG tablet Take 1 tablet (81 mg total) by mouth daily.    . Calcium Citrate 200 MG TABS Take 1 tablet by mouth daily. Reported on 10/06/2015    . Cholecalciferol (VITAMIN D-3) 1000 UNITS CAPS Take 1 capsule by mouth 2 (two) times daily. Reported on 10/06/2015    . docusate sodium (COLACE) 100 MG capsule Take 100 mg by mouth daily as needed for mild constipation.     . Eyelid Cleansers (STERILID EX) Apply topically. Eye Wash: Once Daily    . glipiZIDE (GLUCOTROL) 5 MG tablet Take 1 tablet (5 mg total) by mouth daily before breakfast. 90 tablet 0  . ketoconazole (NIZORAL) 2 % shampoo Apply topically. Reported on 10/06/2015    . levothyroxine (SYNTHROID, LEVOTHROID) 50 MCG tablet Take 1 tablet (50 mcg total) by mouth daily. 90 tablet 1  . lisinopril (PRINIVIL,ZESTRIL) 2.5 MG tablet Take 1 tablet (2.5 mg total) by mouth daily. 90 tablet 3  . lovastatin (MEVACOR) 10 MG  tablet Take 1 tablet (10 mg total) by mouth at bedtime. 90 tablet 1  . metFORMIN (GLUCOPHAGE-XR) 500 MG 24 hr tablet Take 1 tablet (500 mg total) by mouth 2 (two) times daily with a meal. [2] in AM with B'fast 180 tablet 1  . metoprolol tartrate (LOPRESSOR) 25 MG tablet Take 0.5 tablets (12.5 mg total) by mouth 2 (two) times daily. 180 tablet 1  . Omega-3 Fatty Acids (SB OMEGA-3 FISH OIL) 1000 MG CAPS Take 1 capsule by mouth 2 (two) times daily. Reported on 10/06/2015    . OVER THE COUNTER MEDICATION Take 1 each by mouth 2 (two) times daily. Focus Select Supplement: Vitamin C & E, Zinc & Copper     No current facility-administered medications for this visit.    Allergies:   Shellfish allergy;  Aspirin-dipyridamole er; and Other    Social History:  The patient  reports that he has never smoked. He does not have any smokeless tobacco history on file.   Family History:  The patient's family history includes Cancer in his paternal aunt; Diabetes in his brother and cousin; GI Bleed (age of onset: 68) in his father; Pneumonia (age of onset: 59) in his mother.    ROS:  Please see the history of present illness. Has recent history of increased nausea, falls, decreased energy.   Otherwise, review of systems are positive for none.   All other systems are reviewed and negative.    PHYSICAL EXAM: VS:  BP 122/84 mmHg  Pulse 74  Ht 5\' 7"  (1.702 m)  Wt 170 lb 12.8 oz (77.474 kg)  BMI 26.74 kg/m2 , BMI Body mass index is 26.74 kg/(m^2). GEN: Elderly, in no acute distress HEENT: normal Neck: no JVD, carotid bruits, or masses Cardiac: RRR; Soft systolic RUSB murmur, norubs, or gallops, trace BLE edema  Respiratory:  clear to auscultation bilaterally, normal work of breathing GI: soft, nontender, nondistended, + BS MS: no deformity or atrophy Skin: warm and dry, no rash Neuro:  Strength and sensation are intact, walks slowly with a cane Psych: euthymic mood, full affect   EKG: 06/22/15-sinus rhythm, 63, right bundle branch block, no other abnormalities.   Recent Labs: 05/24/2015: ALT 20 08/28/2015: Hemoglobin 13.0; Platelets 258.0 10/06/2015: BUN 17; Creatinine, Ser 0.89; Potassium 4.1; Sodium 140; TSH 3.84    Lipid Panel    Component Value Date/Time   CHOL 95 07/04/2015 0812   TRIG 85.0 07/04/2015 0812   HDL 33.30* 07/04/2015 0812   CHOLHDL 3 07/04/2015 0812   VLDL 17.0 07/04/2015 0812   LDLCALC 45 07/04/2015 0812      Wt Readings from Last 3 Encounters:  12/25/15 170 lb 12.8 oz (77.474 kg)  10/06/15 169 lb 3.2 oz (76.749 kg)  07/18/15 177 lb 6 oz (80.457 kg)      Other studies Reviewed: Additional studies/ records that were reviewed today include: Lab work, office  note, EKG reviewed. We will be obtaining old cardiology records. Review of the above records demonstrates: As above   ASSESSMENT AND PLAN:  Paroxysmal atrial fibrillation    - At prior visit, his wife is eager for him, the amiodarone and he has not been on this medication now and is doing well. When he has atrial fibrillation his heart rate is usually in the 180 range. I would have a low threshold for resuming amiodarone if his atrial for ablation were to return. We also discussed the possibility of maintaining sinus rhythm especially since he is not on anticoagulation. He  did have an event monitor prior to discontinuation of Coumadin that showed no evidence of atrial fibrillation. Interestingly, his episodes are quite sporadic and he does not seem to follow the usual timeline of increased bouts of atrial fibrillation with advanced age. We will continue with low-dose metoprolol.   - Not on anticoagulation. CHADSVASc of 6. Increased risk of stroke without anticoagulation. Discussed. Understands risk of stroke without anticoagulation. Understands that aspirin does not fully reduce the risk of stroke.  2. Diabetes-hemoglobin A1c 5.3 per Elyn Aquas, primary provider.   - I will decrease lisinopril to 2.5 mg once a day. This will maintain renal protection with diabetes but will help increase his blood pressure which is quite low.  3. Hyperlipidemia-currently on Mevacor. ALT normal.  4. Right bundle branch block-chronic  Ultimately, he seems to have declined from a stamina standpoint, equilibrium standpoint but no recent falls over the past 4 months "his wife, she would like her old husband back. She understands however being realistic that this may be challenging but also appreciates the fact that we are helping him to the best of our abilities. Stress during the move.   Current medicines are reviewed at length with the patient today.  The patient does not have concerns regarding medicines.  The  following changes have been made:  None  Labs/ tests ordered today include:  No orders of the defined types were placed in this encounter.     Disposition:   FU with Emmarose Klinke in 12 months  Signed, Candee Furbish, MD  12/25/2015 11:25 AM    [facility] [address], [place], [place]  [address] Phone: [phone]; Fax: [phone]

## 2015-12-26 ENCOUNTER — Other Ambulatory Visit: Payer: Self-pay | Admitting: Family Medicine

## 2016-01-02 ENCOUNTER — Ambulatory Visit (INDEPENDENT_AMBULATORY_CARE_PROVIDER_SITE_OTHER): Payer: Medicare Other | Admitting: Physician Assistant

## 2016-01-02 ENCOUNTER — Encounter: Payer: Self-pay | Admitting: Physician Assistant

## 2016-01-02 ENCOUNTER — Other Ambulatory Visit: Payer: Self-pay | Admitting: *Deleted

## 2016-01-02 VITALS — BP 110/50 | HR 68 | Temp 97.5°F | Ht 67.0 in | Wt 173.0 lb

## 2016-01-02 DIAGNOSIS — E118 Type 2 diabetes mellitus with unspecified complications: Secondary | ICD-10-CM | POA: Diagnosis not present

## 2016-01-02 DIAGNOSIS — IMO0002 Reserved for concepts with insufficient information to code with codable children: Secondary | ICD-10-CM

## 2016-01-02 DIAGNOSIS — IMO0001 Reserved for inherently not codable concepts without codable children: Secondary | ICD-10-CM

## 2016-01-02 DIAGNOSIS — H9193 Unspecified hearing loss, bilateral: Secondary | ICD-10-CM | POA: Diagnosis not present

## 2016-01-02 DIAGNOSIS — E1165 Type 2 diabetes mellitus with hyperglycemia: Principal | ICD-10-CM

## 2016-01-02 DIAGNOSIS — I480 Paroxysmal atrial fibrillation: Secondary | ICD-10-CM

## 2016-01-02 DIAGNOSIS — W19XXXD Unspecified fall, subsequent encounter: Secondary | ICD-10-CM

## 2016-01-02 DIAGNOSIS — R5381 Other malaise: Secondary | ICD-10-CM

## 2016-01-02 DIAGNOSIS — Z8673 Personal history of transient ischemic attack (TIA), and cerebral infarction without residual deficits: Secondary | ICD-10-CM

## 2016-01-02 LAB — BASIC METABOLIC PANEL
BUN: 17 mg/dL (ref 6–23)
CHLORIDE: 100 meq/L (ref 96–112)
CO2: 31 mEq/L (ref 19–32)
CREATININE: 0.99 mg/dL (ref 0.40–1.50)
Calcium: 9.9 mg/dL (ref 8.4–10.5)
GFR: 75.45 mL/min (ref >60.00)
Glucose, Bld: 166 mg/dL — ABNORMAL HIGH (ref 70–99)
Potassium: 4.5 mEq/L (ref 3.5–5.1)
Sodium: 138 mEq/L (ref 135–145)

## 2016-01-02 LAB — HEMOGLOBIN A1C: Hgb A1c MFr Bld: 6.2 % (ref 4.6–6.5)

## 2016-01-02 NOTE — Progress Notes (Signed)
Subjective:    Patient ID: Gerald Holt, male    DOB: 12/30/1923, 82 y.o.   MRN: [id number]  On Glypizide  Lab Results  Component Value Date   HGBA1C 5.3 10/06/2015    Diabetes He presents for his follow-up diabetic visit. He has type 2 diabetes mellitus. There are no hypoglycemic associated symptoms. Pertinent negatives for hypoglycemia include no confusion or headaches. Pertinent negatives for diabetes include no blurred vision, no chest pain, no foot paresthesias, no foot ulcerations, no polydipsia, no polyphagia, no visual change, no weakness and no weight loss. Symptoms are stable. Current diabetic treatment includes oral agent (monotherapy). He is following a diabetic diet. Meal planning includes avoidance of concentrated sweets. He does not see a podiatrist.Eye exam is current (seen 08/2015).   Doing well on DM medications new regimen adjusted recently and has radically changed what he is eating, eating better' per wife; no reported hypoglycemic epiosdes. Doesn't check blood sugar.  Hypertension:  Taking metoprolol and lisinopril. Denies dizziness.   Memory changes: wife is concerned regarding memory changes. 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 Notes forgetfulness although unsure if part of hearing loss. Denies forgetting people.   Daytime sleepiness: She also states that he is sleeping a lot during the day.   Hearing loss: Would like referral to audiology  General: Wifestates that she may need help caring for him. Interested in social work referral. Of note, hasn't fallen in 4 months 'record for him.'   Toenails: Trouble cutting toenails as they are so thick. Would like see podiatrist.     Review of Systems  Constitutional: Negative for fever, chills and weight loss.  HENT: Negative for congestion, ear pain, rhinorrhea, sinus pressure and sore throat.   Eyes: Negative for blurred vision.  Respiratory: Negative for cough, shortness of  breath and wheezing.   Cardiovascular: Negative for chest pain and palpitations.  Gastrointestinal: Negative for nausea, vomiting and diarrhea.  Endocrine: Negative for polydipsia and polyphagia.  Musculoskeletal: Negative for myalgias.  Neurological: Negative for weakness and headaches.  Psychiatric/Behavioral: Negative for confusion.       Memory changes       Past Medical History  Diagnosis Date  . Stroke [facility]) Nov 2011  . Diabetes type 2, controlled ([place]) 2000  . History of chicken pox   . Whooping cough   . Mumps     Adult  . A-fib ([place])   . Cataracts, bilateral   . Retinopathy   . Undescended testicle, unilateral     Right    Social History   Social History  . Marital Status: Married    Spouse Name: N/A  . Number of Children: N/A  . Years of Education: N/A   Occupational History  . Not on file.   Social History Main Topics  . Smoking status: Never Smoker   . Smokeless tobacco: Not on file     Comment: Hasn't smoked since age 4  . Alcohol Use: Not on file  . Drug Use: Not on file  . Sexual Activity: Not on file   Other Topics Concern  . Not on file   Social History Narrative    Past Surgical History  Procedure Laterality Date  . Tooth extraction    . Pre-malignant / benign skin lesion excision    . Testicle surgery      Right, undescended    Family History  Problem Relation Age of Onset  . Pneumonia Mother 2  Deceased  . GI Bleed Father 42    Deceased - Ulcers  . Diabetes Cousin   . Diabetes Brother   . Cancer Paternal Aunt     Allergies  Allergen Reactions  . Shellfish Allergy Anaphylaxis  . Aspirin-Dipyridamole Er     Unknown per patient  . Other Other (See Comments)    Blood Thinner given in Rehab gave H/As - Not Coumadin     Current Outpatient Prescriptions on File Prior to Visit  Medication Sig Dispense Refill  . aspirin 81 MG tablet Take 1 tablet (81 mg total) by mouth daily.    . Calcium Citrate 200 MG TABS Take 1  tablet by mouth daily. Reported on 10/06/2015    . Cholecalciferol (VITAMIN D-3) 1000 UNITS CAPS Take 1 capsule by mouth 2 (two) times daily. Reported on 10/06/2015    . docusate sodium (COLACE) 100 MG capsule Take 100 mg by mouth daily as needed for mild constipation.     . Eyelid Cleansers (STERILID EX) Apply topically. Eye Wash: Once Daily    . glipiZIDE (GLUCOTROL) 5 MG tablet Take 1 tablet (5 mg total) by mouth daily before breakfast. 90 tablet 0  . ketoconazole (NIZORAL) 2 % shampoo Apply topically. Reported on 10/06/2015    . levothyroxine (SYNTHROID, LEVOTHROID) 50 MCG tablet TAKE 1 TABLET EVERY DAY 90 tablet 1  . lisinopril (PRINIVIL,ZESTRIL) 2.5 MG tablet Take 1 tablet (2.5 mg total) by mouth daily. 90 tablet 3  . lovastatin (MEVACOR) 10 MG tablet Take 1 tablet (10 mg total) by mouth at bedtime. 90 tablet 1  . metFORMIN (GLUCOPHAGE-XR) 500 MG 24 hr tablet Take 1 tablet (500 mg total) by mouth 2 (two) times daily with a meal. [2] in AM with B'fast 180 tablet 1  . metoprolol tartrate (LOPRESSOR) 25 MG tablet Take 0.5 tablets (12.5 mg total) by mouth 2 (two) times daily. 180 tablet 1  . Omega-3 Fatty Acids (SB OMEGA-3 FISH OIL) 1000 MG CAPS Take 1 capsule by mouth 2 (two) times daily. Reported on 10/06/2015    . OVER THE COUNTER MEDICATION Take 1 each by mouth 2 (two) times daily. Focus Select Supplement: Vitamin C & E, Zinc & Copper     No current facility-administered medications on file prior to visit.    BP 110/50 mmHg  Pulse 68  Temp(Src) 97.5 F (36.4 C) (Oral)  Ht 5\' 7"  (1.702 m)  Wt 173 lb (78.472 kg)  BMI 27.09 kg/m2  SpO2 94%    Objective:   Physical Exam  Constitutional: He appears well-developed and well-nourished.  Mild sleepiness during visit. When spoken to, answering appropriately to question.   HENT:  Nose: Nose normal.  Eyes: Conjunctivae are normal.  Cardiovascular: Regular rhythm and normal heart sounds.   Pulmonary/Chest: Effort normal and breath sounds  normal. No respiratory distress. He has no wheezes. He has no rhonchi. He has no rales.  Lymphadenopathy:       Head (right side): No submental, no submandibular, no tonsillar, no preauricular, no posterior auricular and no occipital adenopathy present.       Head (left side): No submental, no submandibular, no tonsillar, no preauricular, no posterior auricular and no occipital adenopathy present.    He has no cervical adenopathy.  Neurological: He is alert.  Skin: Skin is warm and dry.  Psychiatric: He has a normal mood and affect. His speech is normal and behavior is normal.  Vitals reviewed.         Assessment &  Plan:  Recheck A1C.  Suspect mild cognitive impairment versus dementia at this time. Will evaluate hearing first and continue to assess. Consider low dose aricept.  Suspect that daytime sleepiness may be compounded by hearing loss. Again, plan to evaluate hearing first to address sleepiness.

## 2016-01-02 NOTE — Progress Notes (Signed)
Pre visit review using our clinic review tool, if applicable. No additional management support is needed unless otherwise documented below in the visit note. 

## 2016-01-02 NOTE — Progress Notes (Deleted)
Patient presents to clinic today c/o ***.   Past Medical History  Diagnosis Date  . Stroke [facility]) Nov 2011  . Diabetes type 2, controlled ([place]) 2000  . History of chicken pox   . Whooping cough   . Mumps     Adult  . A-fib ([place])   . Cataracts, bilateral   . Retinopathy   . Undescended testicle, unilateral     Right    Current Outpatient Prescriptions on File Prior to Visit  Medication Sig Dispense Refill  . aspirin 81 MG tablet Take 1 tablet (81 mg total) by mouth daily.    . Calcium Citrate 200 MG TABS Take 1 tablet by mouth daily. Reported on 10/06/2015    . Cholecalciferol (VITAMIN D-3) 1000 UNITS CAPS Take 1 capsule by mouth 2 (two) times daily. Reported on 10/06/2015    . docusate sodium (COLACE) 100 MG capsule Take 100 mg by mouth daily as needed for mild constipation.     . Eyelid Cleansers (STERILID EX) Apply topically. Eye Wash: Once Daily    . glipiZIDE (GLUCOTROL) 5 MG tablet Take 1 tablet (5 mg total) by mouth daily before breakfast. 90 tablet 0  . ketoconazole (NIZORAL) 2 % shampoo Apply topically. Reported on 10/06/2015    . levothyroxine (SYNTHROID, LEVOTHROID) 50 MCG tablet TAKE 1 TABLET EVERY DAY 90 tablet 1  . lisinopril (PRINIVIL,ZESTRIL) 2.5 MG tablet Take 1 tablet (2.5 mg total) by mouth daily. 90 tablet 3  . lovastatin (MEVACOR) 10 MG tablet Take 1 tablet (10 mg total) by mouth at bedtime. 90 tablet 1  . metFORMIN (GLUCOPHAGE-XR) 500 MG 24 hr tablet Take 1 tablet (500 mg total) by mouth 2 (two) times daily with a meal. [2] in AM with B'fast 180 tablet 1  . metoprolol tartrate (LOPRESSOR) 25 MG tablet Take 0.5 tablets (12.5 mg total) by mouth 2 (two) times daily. 180 tablet 1  . Omega-3 Fatty Acids (SB OMEGA-3 FISH OIL) 1000 MG CAPS Take 1 capsule by mouth 2 (two) times daily. Reported on 10/06/2015    . OVER THE COUNTER MEDICATION Take 1 each by mouth 2 (two) times daily. Focus Select Supplement: Vitamin C & E, Zinc & Copper     No current  facility-administered medications on file prior to visit.    Allergies  Allergen Reactions  . Shellfish Allergy Anaphylaxis  . Aspirin-Dipyridamole Er     Unknown per patient  . Other Other (See Comments)    Blood Thinner given in Rehab gave H/As - Not Coumadin     Family History  Problem Relation Age of Onset  . Pneumonia Mother 20    Deceased  . GI Bleed Father 16    Deceased - Ulcers  . Diabetes Cousin   . Diabetes Brother   . Cancer Paternal Aunt     Social History   Social History  . Marital Status: Married    Spouse Name: N/A  . Number of Children: N/A  . Years of Education: N/A   Social History Main Topics  . Smoking status: Never Smoker   . Smokeless tobacco: Not on file     Comment: Hasn't smoked since age 9  . Alcohol Use: Not on file  . Drug Use: Not on file  . Sexual Activity: Not on file   Other Topics Concern  . Not on file   Social History Narrative    Review of Systems - See HPI.  All other ROS are negative.  There  were no vitals taken for this visit.  Physical Exam  Recent Results (from the past 2160 hour(s))  TSH     Status: None   Collection Time: 10/06/15  9:04 AM  Result Value Ref Range   TSH 3.84 0.35 - 4.50 uIU/mL  Hemoglobin A1c     Status: None   Collection Time: 10/06/15  9:04 AM  Result Value Ref Range   Hgb A1c MFr Bld 5.3 4.6 - 6.5 %    Comment: Glycemic Control Guidelines for People with Diabetes:Non Diabetic:  <6%Goal of Therapy: <7%Additional Action Suggested:  123456   Basic Metabolic Panel (BMET)     Status: None   Collection Time: 10/06/15  9:04 AM  Result Value Ref Range   Sodium 140 135 - 145 mEq/L   Potassium 4.1 3.5 - 5.1 mEq/L   Chloride 103 96 - 112 mEq/L   CO2 26 19 - 32 mEq/L   Glucose, Bld 93 70 - 99 mg/dL   BUN 17 6 - 23 mg/dL   Creatinine, Ser 0.89 0.40 - 1.50 mg/dL   Calcium 9.7 8.4 - 10.5 mg/dL   GFR 85.37 >60.00 mL/min    Assessment/Plan: No problem-specific assessment & plan notes found for  this encounter.

## 2016-01-02 NOTE — Patient Instructions (Addendum)
Referrals to audiology, podiatrist, social work, physical therapy, and cardiology closer to [place].   Go to lab and we will call with results. Please continued medications as directed.  Will notify you once we have spoken to mail order pharmacy.   Encourage walking with walker to condition muscles and enjoy some fresh air.   Take care :)

## 2016-01-02 NOTE — Progress Notes (Signed)
Patient was denied via [facility], attempt to receive approval for Home Health via [place] 04/11

## 2016-01-03 ENCOUNTER — Encounter: Payer: Self-pay | Admitting: Physician Assistant

## 2016-01-03 ENCOUNTER — Other Ambulatory Visit: Payer: Medicare Other

## 2016-01-03 LAB — URINALYSIS, ROUTINE W REFLEX MICROSCOPIC
BILIRUBIN URINE: NEGATIVE
Hgb urine dipstick: NEGATIVE
KETONES UR: NEGATIVE
LEUKOCYTES UA: NEGATIVE
Nitrite: NEGATIVE
PH: 6.5 (ref 5.0–8.0)
RBC / HPF: NONE SEEN (ref 0–?)
SPECIFIC GRAVITY, URINE: 1.01 (ref 1.000–1.030)
TOTAL PROTEIN, URINE-UPE24: NEGATIVE
URINE GLUCOSE: NEGATIVE
UROBILINOGEN UA: 0.2 (ref 0.0–1.0)

## 2016-01-05 ENCOUNTER — Telehealth: Payer: Self-pay | Admitting: Cardiology

## 2016-01-05 NOTE — Telephone Encounter (Signed)
If pt needs to see a cardiologist in [facility] he should be offered an appt with Dr Stanford Breed.  This would be OK with Dr Marlou Porch for the best care of the patient.

## 2016-01-05 NOTE — Telephone Encounter (Signed)
New Message  [facility] received a referral in [place] to schedule with [place]. Called pt to sch follow up and found that the pt simple wants a cardiologist closer to home. Please call pt wife back to discuss who they should continue care with in [place]. Thanks

## 2016-01-15 ENCOUNTER — Ambulatory Visit: Payer: Medicare Other | Attending: Family | Admitting: Physical Therapy

## 2016-01-15 DIAGNOSIS — R262 Difficulty in walking, not elsewhere classified: Secondary | ICD-10-CM | POA: Insufficient documentation

## 2016-01-15 DIAGNOSIS — R2681 Unsteadiness on feet: Secondary | ICD-10-CM | POA: Insufficient documentation

## 2016-01-15 DIAGNOSIS — M6281 Muscle weakness (generalized): Secondary | ICD-10-CM | POA: Diagnosis not present

## 2016-01-15 DIAGNOSIS — R2689 Other abnormalities of gait and mobility: Secondary | ICD-10-CM

## 2016-01-15 NOTE — Therapy (Signed)
[facility] [place] [address]  [place], [place], [address] Phone: [phone]   Fax:  [phone]  Physical Therapy Evaluation  Patient Details  Name: Gerald Holt MRN: [id number] Date of Birth: 1924/06/08 Referring Provider: [name], PA-C  Encounter Date: 01/15/2016      PT End of Session - 01/15/16 1938    Visit Number 1   Number of Visits 20   Date for PT Re-Evaluation 03/25/16   PT Start Time [id number]   PT Stop Time 1625   PT Time Calculation (min) 52 min   Activity Tolerance Patient tolerated treatment well   Behavior During Therapy [facility] for tasks assessed/performed      Past Medical History  Diagnosis Date  . Stroke [facility]) Nov 2011  . Diabetes type 2, controlled ([place]) 2000  . History of chicken pox   . Whooping cough   . Mumps     Adult  . A-fib ([place])   . Cataracts, bilateral   . Retinopathy   . Undescended testicle, unilateral     Right    Past Surgical History  Procedure Laterality Date  . Tooth extraction    . Pre-malignant / benign skin lesion excision    . Testicle surgery      Right, undescended    There were no vitals filed for this visit.       Subjective Assessment - 01/15/16 1538    Subjective Pt's wife answering majority of questions at pt's request due to pt extremely HOH. Pt's wife reports pt experienced a stroke ~5 yrs ago with residual balance deficits. She notes 8 falls in the last 18 months, but none in the last 5 months. She feels that he has become less active with pt no longer walking 1 mile per day since increase in falls ~18 months ago.   Patient is accompained by: Family member  Wife - Ellie   Patient Stated Goals Per wife, To get him back to walking on a regular basis, preferably w/o an AD if appropriate or otherwise with LRAD, & to improve balance.   Currently in Pain? No/denies            [facility] PT Assessment - 01/15/16 1533    Assessment   Medical  Diagnosis Physical deconditioning   Referring Provider [name], PA-C   Onset Date/Surgical Date --  18-22 months   Next MD Visit 3 months   Prior Therapy Rehab + OP therapy at time of CVA; OP PT in July 2016 due to falls   Balance Screen   Has the patient fallen in the past 6 months Yes   How many times? 1  8 falls in last 18-22 months; with L wrist fx x1, rib fx's   Has the patient had a decrease in activity level because of a fear of falling?  Yes   Is the patient reluctant to leave their home because of a fear of falling?  No   Prior Function   Level of Independence Independent with household mobility with device;Needs assistance with ADLs;Independent with transfers   Vocation Retired   Leisure Walking in neighborhood, playing piano, reading, listening to music   Observation/Other Assessments   Focus on Therapeutic Outcomes (FOTO)  Neuromuscular disorder - 53% (47% limitation); Predicted 58% (42% limitation)   ROM / Strength   AROM / PROM / Strength Strength   Strength   Strength Assessment Site Hip;Knee;Ankle   Right/Left Hip Right;Left  Right Hip Flexion 3+/5   Right Hip Extension 4-/5   Right Hip ABduction 4/5   Right Hip ADduction 3+/5   Left Hip Flexion 3+/5   Left Hip Extension 4-/5   Left Hip ABduction 4/5   Left Hip ADduction 3+/5   Right/Left Knee Right;Left   Right Knee Flexion 4/5   Right Knee Extension 4+/5   Left Knee Flexion 4/5   Left Knee Extension 4+/5   Right/Left Ankle Right;Left   Right Ankle Dorsiflexion 4/5   Left Ankle Dorsiflexion 4/5   Ambulation/Gait   Assistive device Straight cane   Gait Pattern Step-through pattern;Decreased step length - right;Decreased step length - left;Decreased stride length;Decreased hip/knee flexion - right;Decreased hip/knee flexion - left;Poor foot clearance - left;Poor foot clearance - right;Trunk flexed   Gait velocity decreased - 1.18 ft/sec   Standardized Balance Assessment   Standardized Balance  Assessment Berg Balance Test;Timed Up and Go Test;10 meter walk test   10 Meter Walk 1.18 ft/sec  27.88"   Berg Balance Test   Sit to Stand Able to stand  independently using hands   Standing Unsupported Able to stand safely 2 minutes   Sitting with Back Unsupported but Feet Supported on Floor or Stool Able to sit safely and securely 2 minutes   Stand to Sit Sits safely with minimal use of hands   Transfers Able to transfer safely, minor use of hands   Standing Unsupported with Eyes Closed Able to stand 10 seconds with supervision   Standing Ubsupported with Feet Together Able to place feet together independently and stand for 1 minute with supervision   From Standing, Reach Forward with Outstretched Arm Can reach confidently >25 cm (10")   From Standing Position, Pick up Object from Floor Able to pick up shoe, needs supervision   From Standing Position, Turn to Look Behind Over each Shoulder Turn sideways only but maintains balance   Turn 360 Degrees Able to turn 360 degrees safely but slowly  5.63" to R. 8.02" to L   Standing Unsupported, Alternately Place Feet on Step/Stool Able to complete 4 steps without aid or supervision   Standing Unsupported, One Foot in [employer] balance while stepping or standing  13.41" w/ several small steps to achieve sm staggered stance   Standing on One Leg Unable to try or needs assist to prevent fall   Total Score 38   Berg comment: 37-45 significant risk for falls (>80%)   Timed Up and Go Test   TUG Normal TUG   Normal TUG (seconds) 26.22   TUG Comments >13.5 sec indicates high fall risk               PT Short Term Goals - 01/15/16 1951    PT SHORT TERM GOAL #1   Title Pt will be independent with initial HEP by 02/05/16   Status New   PT SHORT TERM GOAL #2   Title Pt will demonstrate appropriate gait pattern with SPC by 02/05/16   Status New           PT Long Term Goals - 01/15/16 1952    PT LONG TERM GOAL #1   Title Pt will be  independent with advanced HEP as indicated by 03/25/16    Status New   PT LONG TERM GOAL #2   Title Pt will improve bilateral LE strength to 4/5 or greater by 03/25/16    Status New   PT LONG TERM GOAL #3   Title Pt  will improve gait speed to 2.0 ft/sec or greater for improved safety with ambulation by 03/25/16    Status New   PT LONG TERM GOAL #4   Title Pt will demonstrate improvement on the Berg Balance Scale to 46/56 or greater to reduce risk for falls by 03/25/16    Status New   PT LONG TERM GOAL #5   Title Pt will complete the TUG in >/=18 sec to reduce risk for falls by 03/25/16   Status New               Plan - 01-17-16 1940    Clinical Impression Statement Mr Bajema is a 82 y/o male with a history of a CVA in 2011 with residual short term memory loss and balance instability who presents to OP PT for worsening of balance and gait stability along with decreasing activity tolerance and increased incidence of falls (8 falls in past 18-22 months per wife).  Pt previously underwent therapy after his stroke in 2011 and again attempted therapy in July of last year but more recent attempt discontinued early due to repeated falls during which he suffered a L wrist fracture.  Pt's MD recently suggested pt begin using a cane for ambulation due to increasing instability.  Assessment revealed bilateral LE weakness, proximal > distal (hip flexion and adduction 3+/5; hip extension 4-/5; hip abduction, knee flexion and ankle DF 4/5; and knee extension 4+/5) with probable limited flexibility in proximal musculature based on posture, although assessment limited by time constraints. Gait speed was decreased to 1.18 ft/sec and TUG time = 26.22 indicating a high risk for falls. Balance testing via the Berg Balance Scale (38/56) also indicated a significant risk (>80%) for falls. Due to the LE weakness, lack of balance and gait instability pt and wife report pt has not been able to participate in the activities  he normally enjoys such as walking in the neighborhood up to 1 mile per day.   Rehab Potential Good   Clinical Impairments Affecting Rehab Potential Age, h/o CVA with residual STM loss, DM, h/o frequent falls   PT Frequency 2x / week   PT Duration --  10 weeks   PT Treatment/Interventions Patient/family education;Therapeutic exercise;Therapeutic activities;Neuromuscular re-education;Balance training;Functional mobility training;Gait training;Stair training;Manual techniques;ADLs/Self Care Home Management   PT Next Visit Plan Complete "Home Environment" assessment; Create initial HEP for LE flexibility and proximal strengthening; Gait training with SPC   Consulted and Agree with Plan of Care Patient;Family member/caregiver   Family Member Consulted Wife - Ellie      Patient will benefit from skilled therapeutic intervention in order to improve the following deficits and impairments:  Decreased balance, Abnormal gait, Difficulty walking, Decreased coordination, Decreased strength, Postural dysfunction, Impaired flexibility, Decreased activity tolerance, Decreased knowledge of use of DME  Visit Diagnosis: Unsteadiness on feet  Other abnormalities of gait and mobility  Difficulty in walking, not elsewhere classified  Muscle weakness (generalized)      G-Codes - 2016/01/17 1939    Functional Assessment Tool Used TUG = 26.22" (42% limitation)   Functional Limitation Mobility: Walking and moving around   Mobility: Walking and Moving Around Current Status [phone]) At least 40 percent but less than 60 percent impaired, limited or restricted   Mobility: Walking and Moving Around Goal Status [phone]) At least 20 percent but less than 40 percent impaired, limited or restricted       Problem List Patient Active Problem List   Diagnosis Date Noted  . Leukocytosis 07/18/2015  .  Gastroesophageal reflux disease without esophagitis 07/09/2015  . Elevated TSH 07/09/2015  . Falls 06/22/2015  .  Diabetes mellitus type II, uncontrolled ([place]) 05/29/2015  . Paroxysmal atrial fibrillation ([place]) 05/29/2015  . Hyperlipidemia 05/29/2015  . Hx of completed stroke 05/29/2015    Percival Spanish, PT, MPT 01/15/2016, 8:01 PM  [facility] [address]  [place], [place], [address] Phone: [phone]   Fax:  [phone]  Name: Gerald Holt MRN: [id number] Date of Birth: 09/16/1924

## 2016-01-18 ENCOUNTER — Ambulatory Visit: Payer: Medicare Other

## 2016-01-18 DIAGNOSIS — R2681 Unsteadiness on feet: Secondary | ICD-10-CM | POA: Diagnosis not present

## 2016-01-18 DIAGNOSIS — R262 Difficulty in walking, not elsewhere classified: Secondary | ICD-10-CM

## 2016-01-18 DIAGNOSIS — R2689 Other abnormalities of gait and mobility: Secondary | ICD-10-CM | POA: Diagnosis not present

## 2016-01-18 DIAGNOSIS — M6281 Muscle weakness (generalized): Secondary | ICD-10-CM

## 2016-01-18 NOTE — Therapy (Signed)
[facility] [address]  [place], [place], [address] Phone: [phone]   Fax:  [phone]  Physical Therapy Treatment  Patient Details  Name: Gerald Holt MRN: [id number] Date of Birth: [birth year] Referring Provider: [name], PA-C  Encounter Date: 01/18/2016      PT End of Session - 01/18/16 1832    Visit Number 2   Number of Visits 20   Date for PT Re-Evaluation 03/25/16   PT Start Time 1450   PT Stop Time 1533   PT Time Calculation (min) 43 min   Activity Tolerance Patient tolerated treatment well   Behavior During Therapy [facility] for tasks assessed/performed      Past Medical History  Diagnosis Date  . Stroke [facility]) Nov 2011  . Diabetes type 2, controlled ([place]) 2000  . History of chicken pox   . Whooping cough   . Mumps     Adult  . A-fib ([place])   . Cataracts, bilateral   . Retinopathy   . Undescended testicle, unilateral     Right    Past Surgical History  Procedure Laterality Date  . Tooth extraction    . Pre-malignant / benign skin lesion excision    . Testicle surgery      Right, undescended    There were no vitals filed for this visit.      Subjective Assessment - 01/18/16 1831    Subjective Pt. wife continues to provide majority of questions at pt.'s request due to pt. extremely HOH.  No pain or complaints reported today.     Patient Stated Goals Per wife, To get him back to walking on a regular basis, preferably w/o an AD if appropriate or otherwise with LRAD, & to improve balance.   Currently in Pain? No/denies   Multiple Pain Sites No    Today's Treatment: Therex: NuStep: 6 min, level 4  Hooklying ridging x 15 reps  LE marching in hooklying x 10 reps each leg Bridging with straight legs with heels on peanut p-ball x 15 reps  B hip abduction, extension x 10 reps each side standing next to counter  High knee marching next to counter down / back x 2 laps Side stepping  next to counter down / back x 1 lap  Double leg stance on blue foam mat 2 x 30 sec next to mat table with chair for support if needed       PT Education - 01/18/16 1835    Education provided Yes   Person(s) Educated Spouse;Patient   Methods Explanation;Verbal cues;Handout   Comprehension Need further instruction;Verbalized understanding;Verbal cues required          PT Short Term Goals - 01/18/16 1836    PT SHORT TERM GOAL #1   Title Pt will be independent with initial HEP by 02/05/16   Status On-going   PT SHORT TERM GOAL #2   Title Pt will demonstrate appropriate gait pattern with SPC by 02/05/16   Status On-going           PT Long Term Goals - 01/18/16 1836    PT LONG TERM GOAL #1   Title Pt will be independent with advanced HEP as indicated by 03/25/16    Status On-going   PT LONG TERM GOAL #2   Title Pt will improve bilateral LE strength to 4/5 or greater by 03/25/16    Status On-going   PT LONG TERM GOAL #3   Title  Pt will improve gait speed to 2.0 ft/sec or greater for improved safety with ambulation by 03/25/16    Status On-going   PT LONG TERM GOAL #4   Title Pt will demonstrate improvement on the Berg Balance Scale to 46/56 or greater to reduce risk for falls by 03/25/16    Status On-going   PT LONG TERM GOAL #5   Title Pt will complete the TUG in >/=18 sec to reduce risk for falls by 03/25/16   Status On-going               Plan - 01/18/16 1832    Clinical Impression Statement Pt. with no pain or other complaints reported today via wife due to pt. extremely HOH; increased time required for all tasks today due to pt. HOH.  Today's treatment focused on hip / LE strengthening activity in supine and standing balance activity performed next to counter.  Pt. and wife issued an initial basic hip strengthening handout today; pt. and wife verbalized understaning.  Flexibility HEP to be added next treatment.     PT Treatment/Interventions Patient/family  education;Therapeutic exercise;Therapeutic activities;Neuromuscular re-education;Balance training;Functional mobility training;Gait training;Stair training;Manual techniques;ADLs/Self Care Home Management   PT Next Visit Plan Add flexibility HEP; Assess recall of initial HEP; Add LE flexibility HEP; Complete "Home Environment" assessment;  Gait training with [facility]      Patient will benefit from skilled therapeutic intervention in order to improve the following deficits and impairments:  Decreased balance, Abnormal gait, Difficulty walking, Decreased coordination, Decreased strength, Postural dysfunction, Impaired flexibility, Decreased activity tolerance, Decreased knowledge of use of DME  Visit Diagnosis: Unsteadiness on feet  Other abnormalities of gait and mobility  Difficulty in walking, not elsewhere classified  Muscle weakness (generalized)     Problem List Patient Active Problem List   Diagnosis Date Noted  . Leukocytosis 07/18/2015  . Gastroesophageal reflux disease without esophagitis 07/09/2015  . Elevated TSH 07/09/2015  . Falls 06/22/2015  . Diabetes mellitus type II, uncontrolled ([place]) 05/29/2015  . Paroxysmal atrial fibrillation ([place]) 05/29/2015  . Hyperlipidemia 05/29/2015  . Hx of completed stroke 05/29/2015    Bess Harvest, PTA 01/19/2016, 1:07 PM  [facility] [address]  [place], [place], [address] Phone: [phone]   Fax:  [phone]  Name: Alroy Bernhard MRN: [id number] Date of Birth: 1924/06/11

## 2016-01-22 ENCOUNTER — Ambulatory Visit: Payer: Medicare Other | Attending: Family

## 2016-01-22 DIAGNOSIS — R2681 Unsteadiness on feet: Secondary | ICD-10-CM | POA: Insufficient documentation

## 2016-01-22 DIAGNOSIS — R2689 Other abnormalities of gait and mobility: Secondary | ICD-10-CM

## 2016-01-22 DIAGNOSIS — R262 Difficulty in walking, not elsewhere classified: Secondary | ICD-10-CM | POA: Insufficient documentation

## 2016-01-22 DIAGNOSIS — M6281 Muscle weakness (generalized): Secondary | ICD-10-CM | POA: Insufficient documentation

## 2016-01-22 NOTE — Therapy (Signed)
[facility] [address]  [place], [place], [address] Phone: [phone]   Fax:  [phone]  Physical Therapy Treatment  Patient Details  Name: Gerald Holt MRN: [id number] Date of Birth: Apr 07, 1924 Referring Provider: [name], PA-C  Encounter Date: 01/22/2016      PT End of Session - 01/22/16 1510    Visit Number 3   Number of Visits 20   Date for PT Re-Evaluation 03/25/16   PT Start Time [id number]   PT Stop Time 1415   PT Time Calculation (min) 54 min   Activity Tolerance Patient tolerated treatment well   Behavior During Therapy [facility] for tasks assessed/performed      Past Medical History  Diagnosis Date  . Stroke [facility]) Nov 2011  . Diabetes type 2, controlled ([place]) 2000  . History of chicken pox   . Whooping cough   . Mumps     Adult  . A-fib ([place])   . Cataracts, bilateral   . Retinopathy   . Undescended testicle, unilateral     Right    Past Surgical History  Procedure Laterality Date  . Tooth extraction    . Pre-malignant / benign skin lesion excision    . Testicle surgery      Right, undescended    There were no vitals filed for this visit.      Subjective Assessment - 01/22/16 1507    Subjective Pt. wife continues to provide majority of questions at pt.'s request due to pt. extremely HOH.  No pain or complaints reported today however wife reports that pt. is unsafe with standing 4- way strengthening HEP at chair; wife reports the shower with grab bars is the safest place for pt. to perform HEP.     Patient Stated Goals Per wife, To get him back to walking on a regular basis, preferably w/o an AD if appropriate or otherwise with LRAD, & to improve balance.   Currently in Pain? No/denies   Multiple Pain Sites No     Today's Treatment: Therex: NuStep: 4 min, level 5  Bridging x 15 reps  Bridging with B hip abd/ER with blue TB x 15 reps  B sidelying clam shell with blue TB x 10  reps each side  Seated fitter leg press (1 blue / 1 black) x 10 reps each  Alternating toe-touch to 8" step x 10 reps each side with cane; close CGA from therapist Alternating toe-touch to 8" step while standing on blue foam pad x 10 reps each side with cane; close CGA from therapist Alternating toe-touch to 8" step while standing on blue foam pad x 10 reps each side with no cane; close CGA from therapist       PT Education - 01/23/16 1003    Education provided Yes   Education Details Hooklying hip abd/ER with blue TB issued to pt.    Person(s) Educated Patient;Spouse   Methods Verbal cues;Explanation;Handout   Comprehension Verbalized understanding;Need further instruction          PT Short Term Goals - 01/18/16 1836    PT SHORT TERM GOAL #1   Title Pt will be independent with initial HEP by 02/05/16   Status On-going   PT SHORT TERM GOAL #2   Title Pt will demonstrate appropriate gait pattern with SPC by 02/05/16   Status On-going           PT Long Term Goals - 01/18/16 1836  PT LONG TERM GOAL #1   Title Pt will be independent with advanced HEP as indicated by 03/25/16    Status On-going   PT LONG TERM GOAL #2   Title Pt will improve bilateral LE strength to 4/5 or greater by 03/25/16    Status On-going   PT LONG TERM GOAL #3   Title Pt will improve gait speed to 2.0 ft/sec or greater for improved safety with ambulation by 03/25/16    Status On-going   PT LONG TERM GOAL #4   Title Pt will demonstrate improvement on the Berg Balance Scale to 46/56 or greater to reduce risk for falls by 03/25/16    Status On-going   PT LONG TERM GOAL #5   Title Pt will complete the TUG in >/=18 sec to reduce risk for falls by 03/25/16   Status On-going               Plan - 01/22/16 0954    Clinical Impression Statement Pt. wife continues to provide majority of questions at pt.'s request due to pt. extremely HOH.  No pain or complaints reported today however wife reports that pt. is  unsafe with standing 4- way strengthening HEP at chair due to chair instability; wife reports the shower with grab bars is the safest place for pt. to perform HEP; wife and pt. instructed to perform standing 4 -way hip strengthening HEP in walk in shower holding onto grab bars with two hands.  Pt. continues to require increased time with all therex secondary to [facility]; pt. tolerated all supine and standing balance and hip strengthening activity well; standing balance activity focused on stepping activities to increase hip / knee flexion, DF for increased B LE clearance with gait.  Plan to add more appropriate HEP with assessment of pt. safety after questioning wife next treatment.       PT Treatment/Interventions Patient/family education;Therapeutic exercise;Therapeutic activities;Neuromuscular re-education;Balance training;Functional mobility training;Gait training;Stair training;Manual techniques;ADLs/Self Care Home Management   PT Next Visit Plan Add flexibility HEP; Assess recall of initial HEP; Add LE flexibility HEP; Complete "Home Environment" assessment;  Gait training with [facility]      Patient will benefit from skilled therapeutic intervention in order to improve the following deficits and impairments:  Decreased balance, Abnormal gait, Difficulty walking, Decreased coordination, Decreased strength, Postural dysfunction, Impaired flexibility, Decreased activity tolerance, Decreased knowledge of use of DME  Visit Diagnosis: Unsteadiness on feet  Other abnormalities of gait and mobility  Difficulty in walking, not elsewhere classified  Muscle weakness (generalized)     Problem List Patient Active Problem List   Diagnosis Date Noted  . Leukocytosis 07/18/2015  . Gastroesophageal reflux disease without esophagitis 07/09/2015  . Elevated TSH 07/09/2015  . Falls 06/22/2015  . Diabetes mellitus type II, uncontrolled ([place]) 05/29/2015  . Paroxysmal atrial fibrillation ([place]) 05/29/2015  .  Hyperlipidemia 05/29/2015  . Hx of completed stroke 05/29/2015    Bess Harvest, PTA 01/23/2016, 10:04 AM  [facility] [address]  [place], [place], [address] Phone: [phone]   Fax:  [phone]  Name: Gerald Holt MRN: [id number] Date of Birth: 1924-03-30

## 2016-01-25 ENCOUNTER — Ambulatory Visit: Payer: Medicare Other

## 2016-01-25 DIAGNOSIS — R2681 Unsteadiness on feet: Secondary | ICD-10-CM

## 2016-01-25 DIAGNOSIS — R2689 Other abnormalities of gait and mobility: Secondary | ICD-10-CM | POA: Diagnosis not present

## 2016-01-25 DIAGNOSIS — R262 Difficulty in walking, not elsewhere classified: Secondary | ICD-10-CM

## 2016-01-25 DIAGNOSIS — M6281 Muscle weakness (generalized): Secondary | ICD-10-CM | POA: Diagnosis not present

## 2016-01-25 NOTE — Therapy (Signed)
[facility] [address]  [place], [place], [address] Phone: [phone]   Fax:  [phone]  Physical Therapy Treatment  Patient Details  Name: Gerald Holt MRN: [id number] Date of Birth: Feb 07, 1924 Referring Provider: [name], PA-C  Encounter Date: 01/25/2016      PT End of Session - 01/25/16 1741    Visit Number 4   Number of Visits 20   Date for PT Re-Evaluation 03/25/16   PT Start Time [id number]   PT Stop Time 1538   PT Time Calculation (min) 49 min   Activity Tolerance Patient tolerated treatment well   Behavior During Therapy [facility] for tasks assessed/performed      Past Medical History  Diagnosis Date  . Stroke [facility]) Nov 2011  . Diabetes type 2, controlled ([place]) 2000  . History of chicken pox   . Whooping cough   . Mumps     Adult  . A-fib ([place])   . Cataracts, bilateral   . Retinopathy   . Undescended testicle, unilateral     Right    Past Surgical History  Procedure Laterality Date  . Tooth extraction    . Pre-malignant / benign skin lesion excision    . Testicle surgery      Right, undescended    There were no vitals filed for this visit.      Subjective Assessment - 01/25/16 1547    Subjective No pain, changes, or complaints since last visit.     Patient Stated Goals Per wife, To get him back to walking on a regular basis, preferably w/o an AD if appropriate or otherwise with LRAD, & to improve balance.   Currently in Pain? No/denies   Multiple Pain Sites No    Today Treatment:  Therex (pt. Required extra time to complete all activities secondary to [facility]): NuStep: level 4, 4 min Bridging x 10 reps  Hooklying bridging with B hip abd/ER with blue TB around knees x 10 reps  Hooklying B hip abd/ER with blue TB around knees x 10 reps  B sidelying clam shell with blue TB around knees x 10 reps each B supine SLR hip flexion x 10 reps (added to HEP) B supine SLR hip extension x 10  reps (added to HEP) B supine SLR hip abduction  x 10 reps (added to HEP) B supine SLR hip adduction x 10 reps (added to HEP)       PT Education - 01/25/16 1741    Education Details Supine hip strengthening HEP; black TB issued to pt. with handout    Person(s) Educated Patient   Methods Handout;Verbal cues;Explanation   Comprehension Verbalized understanding;Need further instruction;Verbal cues required          PT Short Term Goals - 01/18/16 1836    PT SHORT TERM GOAL #1   Title Pt will be independent with initial HEP by 02/05/16   Status On-going   PT SHORT TERM GOAL #2   Title Pt will demonstrate appropriate gait pattern with SPC by 02/05/16   Status On-going           PT Long Term Goals - 01/18/16 1836    PT LONG TERM GOAL #1   Title Pt will be independent with advanced HEP as indicated by 03/25/16    Status On-going   PT LONG TERM GOAL #2   Title Pt will improve bilateral LE strength to 4/5 or greater by 03/25/16    Status  On-going   PT LONG TERM GOAL #3   Title Pt will improve gait speed to 2.0 ft/sec or greater for improved safety with ambulation by 03/25/16    Status On-going   PT LONG TERM GOAL #4   Title Pt will demonstrate improvement on the Berg Balance Scale to 46/56 or greater to reduce risk for falls by 03/25/16    Status On-going   PT LONG TERM GOAL #5   Title Pt will complete the TUG in >/=18 sec to reduce risk for falls by 03/25/16   Status On-going               Plan - 01/25/16 1742    Clinical Impression Statement Pt. continues to require additional time to complete all activities in treatment today with wife frequently attempting to communicate on pt. behalf.  Today's treatment focused on reviewing new HEP handout for exclusively supine hip strengthening activity; pt. and wife verbalized understanding of new HEP.  Pt. with brief dizzy spell upon standing following treatment; pt. BP taken in sitting at 116/80 mmHg pt. monitored by therapist until he  left the building.    PT Treatment/Interventions Patient/family education;Therapeutic exercise;Therapeutic activities;Neuromuscular re-education;Balance training;Functional mobility training;Gait training;Stair training;Manual techniques;ADLs/Self Care Home Management   PT Next Visit Plan Assessment of "Home Environment"; Add LE flexibility HEP; Complete "Home Environment" assessment;  Gait training with [facility]      Patient will benefit from skilled therapeutic intervention in order to improve the following deficits and impairments:  Decreased balance, Abnormal gait, Difficulty walking, Decreased coordination, Decreased strength, Postural dysfunction, Impaired flexibility, Decreased activity tolerance, Decreased knowledge of use of DME  Visit Diagnosis: Unsteadiness on feet  Other abnormalities of gait and mobility  Difficulty in walking, not elsewhere classified  Muscle weakness (generalized)     Problem List Patient Active Problem List   Diagnosis Date Noted  . Leukocytosis 07/18/2015  . Gastroesophageal reflux disease without esophagitis 07/09/2015  . Elevated TSH 07/09/2015  . Falls 06/22/2015  . Diabetes mellitus type II, uncontrolled ([place]) 05/29/2015  . Paroxysmal atrial fibrillation ([place]) 05/29/2015  . Hyperlipidemia 05/29/2015  . Hx of completed stroke 05/29/2015    Bess Harvest, PTA 01/25/2016, 5:55 PM  [facility] [address]  [place], [place], [address] Phone: [phone]   Fax:  [phone]  Name: Gerald Holt MRN: [id number] Date of Birth: [birth year]

## 2016-01-29 ENCOUNTER — Ambulatory Visit: Payer: Medicare Other

## 2016-01-29 DIAGNOSIS — R262 Difficulty in walking, not elsewhere classified: Secondary | ICD-10-CM

## 2016-01-29 DIAGNOSIS — M6281 Muscle weakness (generalized): Secondary | ICD-10-CM

## 2016-01-29 DIAGNOSIS — R2681 Unsteadiness on feet: Secondary | ICD-10-CM | POA: Diagnosis not present

## 2016-01-29 DIAGNOSIS — R2689 Other abnormalities of gait and mobility: Secondary | ICD-10-CM | POA: Diagnosis not present

## 2016-01-29 NOTE — Therapy (Signed)
[facility] [address]  [place], [place], [address] Phone: [phone]   Fax:  [phone]  Physical Therapy Treatment  Patient Details  Name: Gerald Holt MRN: [id number] Date of Birth: 1924-04-11 Referring Provider: [name], PA-C  Encounter Date: 01/29/2016      PT End of Session - 01/29/16 1742    Visit Number 5   Number of Visits 20   Date for PT Re-Evaluation 03/25/16   PT Start Time [id number]   PT Stop Time 1405   PT Time Calculation (min) 42 min   Activity Tolerance Patient tolerated treatment well   Behavior During Therapy [facility] for tasks assessed/performed      Past Medical History  Diagnosis Date  . Stroke [facility]) Nov 2011  . Diabetes type 2, controlled ([place]) 2000  . History of chicken pox   . Whooping cough   . Mumps     Adult  . A-fib ([place])   . Cataracts, bilateral   . Retinopathy   . Undescended testicle, unilateral     Right    Past Surgical History  Procedure Laterality Date  . Tooth extraction    . Pre-malignant / benign skin lesion excision    . Testicle surgery      Right, undescended    There were no vitals filed for this visit.      Subjective Assessment - 01/29/16 1323    Subjective Pt. reports no pain currently, however wife reports pt. was tired most of the day following last treatment and slept most of that day.     Patient Stated Goals Per wife, To get him back to walking on a regular basis, preferably w/o an AD if appropriate or otherwise with LRAD, & to improve balance.   Currently in Pain? No/denies   Multiple Pain Sites No            [facility] PT Assessment - 01/29/16 [place] residence   Living Arrangements Spouse/significant other;Other relatives   Available Help at Discharge Family   Type of [place] entrance   Home Layout One level   [place] - single point;Grab bars -  toilet;Hand held shower head;Grab bars - tub/shower     Today's Treatment:  Therex: NuStep:  Bridging x 15 reps  Bridge with hip isometric / ER with black TB 3 x 5 reps B sidelying clam shell with TB x 10 reps Straight leg bridge with heels on peanut p-ball x 10 reps  Seated scapular retraction with blue TB x 10 reps Alternating toe-touch on 8" step x 10 each side without cane; close CGA from therapist provided  Seated scapular retraction with blue TB x 10 reps Alternating toe-touch on 8" step while standing on blue foam pad x 10 reps each side without cane; close CGA from therapist provided Seated scapular retraction with blue TB x 10 reps        PT Short Term Goals - 01/18/16 1836    PT SHORT TERM GOAL #1   Title Pt will be independent with initial HEP by 02/05/16   Status On-going   PT SHORT TERM GOAL #2   Title Pt will demonstrate appropriate gait pattern with SPC by 02/05/16   Status On-going           PT Long Term Goals - 01/18/16 1836    PT LONG TERM GOAL #  1   Title Pt will be independent with advanced HEP as indicated by 03/25/16    Status On-going   PT LONG TERM GOAL #2   Title Pt will improve bilateral LE strength to 4/5 or greater by 03/25/16    Status On-going   PT LONG TERM GOAL #3   Title Pt will improve gait speed to 2.0 ft/sec or greater for improved safety with ambulation by 03/25/16    Status On-going   PT LONG TERM GOAL #4   Title Pt will demonstrate improvement on the Berg Balance Scale to 46/56 or greater to reduce risk for falls by 03/25/16    Status On-going   PT LONG TERM GOAL #5   Title Pt will complete the TUG in >/=18 sec to reduce risk for falls by 03/25/16   Status On-going               Plan - 01/29/16 1743    Clinical Impression Statement Pt.'s wife reports pt. slept for most of the day following last treatment however that he performed the HEP throughout the weekend well.  Pt. able to tolerate more standing / stepping activity today  with no pain or complaints reported; pt. continues to require increased time with all therex due to [facility]  Pt. very motivated and willing to try any balance activity in PT however may attempt unsafe balance activities at home per wife report.     PT Treatment/Interventions Patient/family education;Therapeutic exercise;Therapeutic activities;Neuromuscular re-education;Balance training;Functional mobility training;Gait training;Stair training;Manual techniques;ADLs/Self Care Home Management   PT Next Visit Plan Add LE flexibility HEP;Gait training with SPC, balance / stepping activities to increase B hip / knee, DF      Patient will benefit from skilled therapeutic intervention in order to improve the following deficits and impairments:  Decreased balance, Abnormal gait, Difficulty walking, Decreased coordination, Decreased strength, Postural dysfunction, Impaired flexibility, Decreased activity tolerance, Decreased knowledge of use of DME  Visit Diagnosis: Unsteadiness on feet  Other abnormalities of gait and mobility  Difficulty in walking, not elsewhere classified  Muscle weakness (generalized)     Problem List Patient Active Problem List   Diagnosis Date Noted  . Leukocytosis 07/18/2015  . Gastroesophageal reflux disease without esophagitis 07/09/2015  . Elevated TSH 07/09/2015  . Falls 06/22/2015  . Diabetes mellitus type II, uncontrolled ([place]) 05/29/2015  . Paroxysmal atrial fibrillation ([place]) 05/29/2015  . Hyperlipidemia 05/29/2015  . Hx of completed stroke 05/29/2015    Bess Harvest, PTA 01/29/2016, 5:55 PM  [facility] [address]  [place], [place], [address] Phone: [phone]   Fax:  [phone]  Name: Gerald Holt MRN: [id number] Date of Birth: 1924/06/28

## 2016-02-01 ENCOUNTER — Ambulatory Visit: Payer: Medicare Other

## 2016-02-01 DIAGNOSIS — M6281 Muscle weakness (generalized): Secondary | ICD-10-CM | POA: Diagnosis not present

## 2016-02-01 DIAGNOSIS — R2681 Unsteadiness on feet: Secondary | ICD-10-CM | POA: Diagnosis not present

## 2016-02-01 DIAGNOSIS — R2689 Other abnormalities of gait and mobility: Secondary | ICD-10-CM | POA: Diagnosis not present

## 2016-02-01 DIAGNOSIS — R262 Difficulty in walking, not elsewhere classified: Secondary | ICD-10-CM

## 2016-02-01 DIAGNOSIS — R21 Rash and other nonspecific skin eruption: Secondary | ICD-10-CM | POA: Diagnosis not present

## 2016-02-01 DIAGNOSIS — L299 Pruritus, unspecified: Secondary | ICD-10-CM | POA: Diagnosis not present

## 2016-02-01 DIAGNOSIS — L578 Other skin changes due to chronic exposure to nonionizing radiation: Secondary | ICD-10-CM | POA: Diagnosis not present

## 2016-02-01 NOTE — Therapy (Signed)
[facility] [address]  [place], [place], [address] Phone: [phone]   Fax:  [phone]  Physical Therapy Treatment  Patient Details  Name: Gerald [place] MRN: [id number] Date of Birth: Jan 12, 1924 Referring Provider: [name], PA-C  Encounter Date: 02/01/2016      PT End of Session - 02/01/16 1317    Visit Number 6   Number of Visits 20   Date for PT Re-Evaluation 03/25/16   PT Start Time 8115   PT Stop Time 1400   PT Time Calculation (min) 41 min   Activity Tolerance Patient tolerated treatment well   Behavior During Therapy [facility] for tasks assessed/performed      Past Medical History  Diagnosis Date  . Stroke [facility]) Nov 2011  . Diabetes type 2, controlled ([facility]) 2000  . History of chicken pox   . Whooping cough   . Mumps     Adult  . A-fib ([place])   . Cataracts, bilateral   . Retinopathy   . Undescended testicle, unilateral     Right    Past Surgical History  Procedure Laterality Date  . Tooth extraction    . Pre-malignant / benign skin lesion excision    . Testicle surgery      Right, undescended    There were no vitals filed for this visit.    Today's Treatment:  Therex: Treadmill: 4 min, 0.8 mph BP initial: 138/80 mmHg  Bridging x 10 reps  Bridge with B hip abd/ER with black TB x 10 reps  Bridge with hip isometric / ER with black TB 3 x 5 reps B sidelying clam shell with black TB x 10 reps Seated scapular retraction with blue TB x 10 reps Alternating step onto blue airex pad with cross-over reaching to red cone on white bolster x 10 reps; close CGA from therapist provided  Seated scapular retraction with blue TB x 10 reps Alternating step onto blue airex pad with cross-over reaching to red cone on white bolster x 10 reps; cones moved farther away; close CGA from therapist provided Seated scapular retraction with blue TB x 10 reps Alternating toe-touch on 8" step x 10 each  side without cane; close CGA from therapist provided        PT Short Term Goals - 02/01/16 1602    PT SHORT TERM GOAL #1   Title Pt will be independent with initial HEP by 02/05/16   Status Partially Met  02/01/16: pt. independent with initial HEP with wifes guidance at home.     PT SHORT TERM GOAL #2   Title Pt will demonstrate appropriate gait pattern with SPC by 02/05/16   Status On-going           PT Long Term Goals - 01/18/16 1836    PT LONG TERM GOAL #1   Title Pt will be independent with advanced HEP as indicated by 03/25/16    Status On-going   PT LONG TERM GOAL #2   Title Pt will improve bilateral LE strength to 4/5 or greater by 03/25/16    Status On-going   PT LONG TERM GOAL #3   Title Pt will improve gait speed to 2.0 ft/sec or greater for improved safety with ambulation by 03/25/16    Status On-going   PT LONG TERM GOAL #4   Title Pt will demonstrate improvement on the Berg Balance Scale to 46/56 or greater to reduce risk for falls by 03/25/16  Status On-going   PT LONG TERM GOAL #5   Title Pt will complete the TUG in >/=18 sec to reduce risk for falls by 03/25/16   Status On-going               Plan - 02/01/16 1317    Clinical Impression Statement Pt. pain free today with no complaints reported per wife report.  Stepping activity with cross-over reach added today; pt. tolerated this well however fatigued following each activity requiring a sitting rest break; scapular retraction strengthening with TB performed with each sitting rest break.  Pt. demo'd improved strength with supine activities today able to tolerated more advanced activities.     PT Treatment/Interventions Patient/family education;Therapeutic exercise;Therapeutic activities;Neuromuscular re-education;Balance training;Functional mobility training;Gait training;Stair training;Manual techniques;ADLs/Self Care Home Management   PT Next Visit Plan Gait training with SPC, balance / stepping activities to  increase B hip / knee, DF      Patient will benefit from skilled therapeutic intervention in order to improve the following deficits and impairments:  Decreased balance, Abnormal gait, Difficulty walking, Decreased coordination, Decreased strength, Postural dysfunction, Impaired flexibility, Decreased activity tolerance, Decreased knowledge of use of DME  Visit Diagnosis: Unsteadiness on feet  Other abnormalities of gait and mobility  Difficulty in walking, not elsewhere classified  Muscle weakness (generalized)     Problem List Patient Active Problem List   Diagnosis Date Noted  . Leukocytosis 07/18/2015  . Gastroesophageal reflux disease without esophagitis 07/09/2015  . Elevated TSH 07/09/2015  . Falls 06/22/2015  . Diabetes mellitus type II, uncontrolled ([place]) 05/29/2015  . Paroxysmal atrial fibrillation ([place]) 05/29/2015  . Hyperlipidemia 05/29/2015  . Hx of completed stroke 05/29/2015    Bess Harvest, PTA 02/01/2016, 4:03 PM  [facility] [address]  [place], [place], [address] Phone: [phone]   Fax:  [phone]  Name: Gerald [place] MRN: [id number] Date of Birth: 31-May-1924

## 2016-02-05 ENCOUNTER — Ambulatory Visit: Payer: Medicare Other

## 2016-02-05 DIAGNOSIS — R2689 Other abnormalities of gait and mobility: Secondary | ICD-10-CM

## 2016-02-05 DIAGNOSIS — M6281 Muscle weakness (generalized): Secondary | ICD-10-CM

## 2016-02-05 DIAGNOSIS — R262 Difficulty in walking, not elsewhere classified: Secondary | ICD-10-CM

## 2016-02-05 DIAGNOSIS — R2681 Unsteadiness on feet: Secondary | ICD-10-CM | POA: Diagnosis not present

## 2016-02-05 NOTE — Therapy (Signed)
[facility] [address]  [place], [place], [address] Phone: [phone]   Fax:  [phone]  Physical Therapy Treatment  Patient Details  Name: Gerald [place] MRN: [id number] Date of Birth: 09/28/1923 Referring Provider: [name], PA-C  Encounter Date: 02/05/2016      PT End of Session - 02/05/16 1520    Visit Number 7   Number of Visits 20   Date for PT Re-Evaluation 03/25/16   PT Start Time 4081   PT Stop Time 1400   PT Time Calculation (min) 43 min   Activity Tolerance Patient tolerated treatment well;Patient limited by lethargy  pt. limited by frequent confusion.     Behavior During Therapy [facility] for tasks assessed/performed      Past Medical History  Diagnosis Date  . Stroke [facility]) Nov 2011  . Diabetes type 2, controlled ([place]) 2000  . History of chicken pox   . Whooping cough   . Mumps     Adult  . A-fib ([place])   . Cataracts, bilateral   . Retinopathy   . Undescended testicle, unilateral     Right    Past Surgical History  Procedure Laterality Date  . Tooth extraction    . Pre-malignant / benign skin lesion excision    . Testicle surgery      Right, undescended    There were no vitals filed for this visit.      Subjective Assessment - 02/05/16 1322    Subjective Pt. reports via wife, pt. and wife went to [place]; wife reports pt. was very tired following trip [place] and    Patient Stated Goals Per wife, To get him back to walking on a regular basis, preferably w/o an AD if appropriate or otherwise with LRAD, & to improve balance.   Currently in Pain? No/denies   Multiple Pain Sites No        Today's Treatment:  Therex: NuStep: 4 min, level 4  BP initial: 138/80 mmHg  Bridging x 10 reps  Bridge with B hip abd/ER with black TB x 10 reps  Fitter leg press knee extension (1 blue / 1 black) x 10 reps each leg B sidelying clam shell with black TB x 10 reps Seated  scapular retraction with blue TB x 10 reps Alternating toe-touch onto 8" step x 10 reps each side Seated scapular retraction with blue TB x 10 reps        PT Education - 02/05/16 1519    Education provided Yes   Education Details B HS stretch    Person(s) Educated Patient;Spouse   Methods Explanation;Verbal cues;Handout   Comprehension Verbalized understanding;Returned demonstration;Need further instruction          PT Short Term Goals - 02/05/16 1521    PT SHORT TERM GOAL #1   Title Pt will be independent with initial HEP by 02/05/16   Status Partially Met  02/01/16: pt. independent with initial HEP with wifes guidance at home.     PT SHORT TERM GOAL #2   Title Pt will demonstrate appropriate gait pattern with SPC by 02/05/16   Status Partially Met  02/05/16: pt. not currently demonstrating appropriate gait pattern with SPC; would benefit from further instruction.            PT Long Term Goals - 01/18/16 1836    PT LONG TERM GOAL #1   Title Pt will be independent with advanced HEP as indicated by 03/25/16  Status On-going   PT LONG TERM GOAL #2   Title Pt will improve bilateral LE strength to 4/5 or greater by 03/25/16    Status On-going   PT LONG TERM GOAL #3   Title Pt will improve gait speed to 2.0 ft/sec or greater for improved safety with ambulation by 03/25/16    Status On-going   PT LONG TERM GOAL #4   Title Pt will demonstrate improvement on the Berg Balance Scale to 46/56 or greater to reduce risk for falls by 03/25/16    Status On-going   PT LONG TERM GOAL #5   Title Pt will complete the TUG in >/=18 sec to reduce risk for falls by 03/25/16   Status On-going               Plan - 02/05/16 1520    Clinical Impression Statement Pt. seen today ith wife reporting pt. with increased confusion secondary to injection recieved from MD last week; pt. demonstrated mild confusion with therex throughout treatment today requiring increased time and instruction to  perform therex.  With exception of mild confusion, pt. tolerated hip / knee strengthening and balance training well today with no complaints.  Pt. however fatigued somewhat more quickly with standing activity; BP taken initially with pt. falling WFL.  HS stretch added to HEP today with handout issued to pt. and wife.     PT Treatment/Interventions Patient/family education;Therapeutic exercise;Therapeutic activities;Neuromuscular re-education;Balance training;Functional mobility training;Gait training;Stair training;Manual techniques;ADLs/Self Care Home Management   PT Next Visit Plan Gait training with SPC, balance / stepping activities to increase B hip / knee, DF      Patient will benefit from skilled therapeutic intervention in order to improve the following deficits and impairments:  Decreased balance, Abnormal gait, Difficulty walking, Decreased coordination, Decreased strength, Postural dysfunction, Impaired flexibility, Decreased activity tolerance, Decreased knowledge of use of DME  Visit Diagnosis: Unsteadiness on feet  Other abnormalities of gait and mobility  Difficulty in walking, not elsewhere classified  Muscle weakness (generalized)     Problem List Patient Active Problem List   Diagnosis Date Noted  . Leukocytosis 07/18/2015  . Gastroesophageal reflux disease without esophagitis 07/09/2015  . Elevated TSH 07/09/2015  . Falls 06/22/2015  . Diabetes mellitus type II, uncontrolled ([place]) 05/29/2015  . Paroxysmal atrial fibrillation ([place]) 05/29/2015  . Hyperlipidemia 05/29/2015  . Hx of completed stroke 05/29/2015    Bess Harvest, PTA 02/05/2016, 3:28 PM  [facility] [address]  [place], [place], [address] Phone: [phone]   Fax:  [phone]  Name: Gerald [place] MRN: [id number] Date of Birth: 11-Mar-1924

## 2016-02-08 ENCOUNTER — Ambulatory Visit: Payer: Medicare Other | Admitting: Physical Therapy

## 2016-02-12 ENCOUNTER — Ambulatory Visit: Payer: Medicare Other | Admitting: Physical Therapy

## 2016-02-12 ENCOUNTER — Ambulatory Visit (INDEPENDENT_AMBULATORY_CARE_PROVIDER_SITE_OTHER): Payer: Medicare Other | Admitting: Physician Assistant

## 2016-02-12 ENCOUNTER — Encounter: Payer: Self-pay | Admitting: Physician Assistant

## 2016-02-12 VITALS — BP 134/72

## 2016-02-12 VITALS — BP 130/58 | HR 69 | Temp 98.0°F | Ht 67.0 in | Wt 164.0 lb

## 2016-02-12 DIAGNOSIS — R2681 Unsteadiness on feet: Secondary | ICD-10-CM

## 2016-02-12 DIAGNOSIS — W19XXXA Unspecified fall, initial encounter: Secondary | ICD-10-CM | POA: Diagnosis not present

## 2016-02-12 DIAGNOSIS — Y92099 Unspecified place in other non-institutional residence as the place of occurrence of the external cause: Secondary | ICD-10-CM

## 2016-02-12 DIAGNOSIS — R262 Difficulty in walking, not elsewhere classified: Secondary | ICD-10-CM | POA: Diagnosis not present

## 2016-02-12 DIAGNOSIS — Y92009 Unspecified place in unspecified non-institutional (private) residence as the place of occurrence of the external cause: Secondary | ICD-10-CM

## 2016-02-12 DIAGNOSIS — M6281 Muscle weakness (generalized): Secondary | ICD-10-CM

## 2016-02-12 DIAGNOSIS — R2689 Other abnormalities of gait and mobility: Secondary | ICD-10-CM | POA: Diagnosis not present

## 2016-02-12 DIAGNOSIS — H9193 Unspecified hearing loss, bilateral: Secondary | ICD-10-CM

## 2016-02-12 DIAGNOSIS — R531 Weakness: Secondary | ICD-10-CM

## 2016-02-12 NOTE — Progress Notes (Signed)
Patient presents to clinic today with his wife who noted patient had a fall last Wednesday at home while leaning too far while trying to get something out of the cupboard. Endorses hitting back of head. Denies LOC. Was able to get up with assistance. Wife noted a lump to the area for a couple of days. This has resolved per wife. Denies excess bruising. She has noted some difficulty with focus since concussion. Denies AMS. Patient complaining of fatigue. Has not complained of headaches, nausea/vomiting, vision changes.  Is continuing PT outpatient. Wife is noting generalized weakness and worsening deconditioning.   Starting to have urinary incontinence intermittently when not making it to the bathroom in time. Has embarrassed patient who is limiting water intake. Is needing more help with all ADLs, including bathing, dressing, as wife is unable to provide complete care herself due to her own health issues.   Patient and wife also requesting Audiology assessment for chronic and gradual bilateral hearing loss.  Past Medical History  Diagnosis Date  . Stroke [facility]) Nov 2011  . Diabetes type 2, controlled ([place]) 2000  . History of chicken pox   . Whooping cough   . Mumps     Adult  . A-fib ([place])   . Cataracts, bilateral   . Retinopathy   . Undescended testicle, unilateral     Right    Current Outpatient Prescriptions on File Prior to Visit  Medication Sig Dispense Refill  . aspirin 81 MG tablet Take 1 tablet (81 mg total) by mouth daily.    . Calcium Citrate 200 MG TABS Take 1 tablet by mouth daily. Reported on 10/06/2015    . Cholecalciferol (VITAMIN D-3) 1000 UNITS CAPS Take 1 capsule by mouth 2 (two) times daily. Reported on 10/06/2015    . docusate sodium (COLACE) 100 MG capsule Take 100 mg by mouth daily as needed for mild constipation.     . Eyelid Cleansers (STERILID EX) Apply topically. Eye Wash: Once Daily    . glipiZIDE (GLUCOTROL) 5 MG tablet Take 1 tablet (5 mg total) by mouth  daily before breakfast. 90 tablet 0  . ketoconazole (NIZORAL) 2 % shampoo Apply topically. Reported on 10/06/2015    . levothyroxine (SYNTHROID, LEVOTHROID) 50 MCG tablet TAKE 1 TABLET EVERY DAY 90 tablet 1  . metFORMIN (GLUCOPHAGE-XR) 500 MG 24 hr tablet Take 1 tablet (500 mg total) by mouth 2 (two) times daily with a meal. [2] in AM with B'fast 180 tablet 1  . metoprolol tartrate (LOPRESSOR) 25 MG tablet Take 0.5 tablets (12.5 mg total) by mouth 2 (two) times daily. 180 tablet 1  . Omega-3 Fatty Acids (SB OMEGA-3 FISH OIL) 1000 MG CAPS Take 1 capsule by mouth 2 (two) times daily. Reported on 10/06/2015    . OVER THE COUNTER MEDICATION Take 1 each by mouth 2 (two) times daily. Focus Select Supplement: Vitamin C & E, Zinc & Copper    . lisinopril (PRINIVIL,ZESTRIL) 2.5 MG tablet Take 1 tablet (2.5 mg total) by mouth daily. (Patient not taking: Reported on 02/12/2016) 90 tablet 3  . lovastatin (MEVACOR) 10 MG tablet Take 1 tablet (10 mg total) by mouth at bedtime. (Patient not taking: Reported on 02/12/2016) 90 tablet 1   No current facility-administered medications on file prior to visit.    Allergies  Allergen Reactions  . Shellfish Allergy Anaphylaxis  . Aspirin-Dipyridamole Er     Unknown per patient  . Other Other (See Comments)    Blood Thinner given in  Rehab gave H/As - Not Coumadin     Family History  Problem Relation Age of Onset  . Pneumonia Mother 65    Deceased  . GI Bleed Father 54    Deceased - Ulcers  . Diabetes Cousin   . Diabetes Brother   . Cancer Paternal Aunt     Social History   Social History  . Marital Status: Married    Spouse Name: N/A  . Number of Children: N/A  . Years of Education: N/A   Social History Main Topics  . Smoking status: Never Smoker   . Smokeless tobacco: None     Comment: Hasn't smoked since age 3  . Alcohol Use: None  . Drug Use: None  . Sexual Activity: Not Asked   Other Topics Concern  . None   Social History Narrative    Review of Systems - See HPI.  All other ROS are negative.  Pulse 69  Temp(Src) 98 F (36.7 C) (Oral)  Ht 5\' 7"  (1.702 m)  Wt 164 lb (74.39 kg)  BMI 25.68 kg/m2  SpO2 96%  Physical Exam  Constitutional: He is well-developed, well-nourished, and in no distress.  HENT:  Head: Normocephalic and atraumatic.  Right Ear: External ear normal.  Left Ear: External ear normal.  Nose: Nose normal.  Mouth/Throat: Oropharynx is clear and moist. No oropharyngeal exudate.  TM within normal limits bilaterally.  Eyes: Conjunctivae are normal.  Neck: Neck supple. No spinous process tenderness and no muscular tenderness present. Normal range of motion present.  Cardiovascular: Normal rate, regular rhythm, normal heart sounds and intact distal pulses.   Pulmonary/Chest: Effort normal and breath sounds normal. No respiratory distress. He has no wheezes. He has no rales. He exhibits no tenderness.  Neurological: No cranial nerve deficit. GCS score is 15.  Alert and oriented to person and place but not time  Skin: Skin is warm and dry. No rash noted.  Psychiatric: Affect normal.  Vitals reviewed.   Recent Results (from the past 2160 hour(s))  Hemoglobin A1c     Status: None   Collection Time: 01/02/16 10:28 AM  Result Value Ref Range   Hgb A1c MFr Bld 6.2 4.6 - 6.5 %    Comment: Glycemic Control Guidelines for People with Diabetes:Non Diabetic:  <6%Goal of Therapy: <7%Additional Action Suggested:  123456   Basic metabolic panel     Status: Abnormal   Collection Time: 01/02/16 10:28 AM  Result Value Ref Range   Sodium 138 135 - 145 mEq/L   Potassium 4.5 3.5 - 5.1 mEq/L   Chloride 100 96 - 112 mEq/L   CO2 31 19 - 32 mEq/L   Glucose, Bld 166 (H) 70 - 99 mg/dL   BUN 17 6 - 23 mg/dL   Creatinine, Ser 0.99 0.40 - 1.50 mg/dL   Calcium 9.9 8.4 - 10.5 mg/dL   GFR 75.45 >60.00 mL/min  Urinalysis, Routine w reflex microscopic     Status: None   Collection Time: 01/03/16  9:11 AM  Result Value Ref  Range   Color, Urine YELLOW Yellow;Lt. Yellow   APPearance CLEAR Clear   Specific Gravity, Urine 1.010 1.000-1.030   pH 6.5 5.0 - 8.0   Total Protein, Urine NEGATIVE Negative   Urine Glucose NEGATIVE Negative   Ketones, ur NEGATIVE Negative   Bilirubin Urine NEGATIVE Negative   Hgb urine dipstick NEGATIVE Negative   Urobilinogen, UA 0.2 0.0 - 1.0   Leukocytes, UA NEGATIVE Negative   Nitrite NEGATIVE Negative  WBC, UA 0-2/hpf 0-2/hpf   RBC / HPF none seen 0-2/hpf   Squamous Epithelial / LPF Rare(0-4/hpf) Rare(0-4/hpf)    Comment: transitional epith present    Assessment/Plan: 1. Bilateral hearing loss Referral to Audiology placed for further assessment. - Ambulatory referral to Audiology  2. Fall at home, initial encounter With mild concussion. Some residual mental fogginess but neuro exam intact. No headaches, nausea, vomiting, vision changes. Discussed fogginess can be normal. Symptoms will gradually improve. Fall is secondary to age, muscular weakness. Referral to home health placed for nursing assessment and MSW. Continue PT outpatient. - Ambulatory referral to Home Health  3. Generalized weakness Continue PT. Home Health RN set up to help with ADLs. Will also have them remove fall risks from home and educate patient's family. He is to continue ambulating with his walker only.  - Ambulatory referral to [facility]

## 2016-02-12 NOTE — Patient Instructions (Signed)
Please have Dieon continue chronic medications as directed but we can stop Mevacor.  Keep up with PT. I will be setting up home health and audiology. Reconsider assisted living facilities.   His fatigue should improve over the next few weeks. This is common after a concussion. His neurological exam looks good today.

## 2016-02-12 NOTE — Therapy (Signed)
[facility] [address]  [place], [place], [address] Phone: [phone]   Fax:  [phone]  Physical Therapy Treatment  Patient Details  Name: Gerald Holt MRN: [id number] Date of Birth: March 17, 1924 Referring Provider: [name], PA-C  Encounter Date: 02/12/2016      PT End of Session - 02/12/16 1329    Visit Number 8   Number of Visits 20   Date for PT Re-Evaluation 03/25/16   PT Start Time 1324   PT Stop Time 1410   PT Time Calculation (min) 55 min   Activity Tolerance Patient tolerated treatment well   Behavior During Therapy [facility] for tasks assessed/performed      Past Medical History  Diagnosis Date  . Stroke [facility]) Nov 2011  . Diabetes type 2, controlled ([place]) 2000  . History of chicken pox   . Whooping cough   . Mumps     Adult  . A-fib ([place])   . Cataracts, bilateral   . Retinopathy   . Undescended testicle, unilateral     Right    Past Surgical History  Procedure Laterality Date  . Tooth extraction    . Pre-malignant / benign skin lesion excision    . Testicle surgery      Right, undescended    Filed Vitals:   02/12/16 1341  BP: 134/72        Subjective Assessment - 02/12/16 1325    Subjective Pt reports fall at home last week (Wed) while attempting to put some music books in a cabinet. Pt fell backwards, hitting his head on the carpeted floor withthe books landing on top of him. Pt denies LOC or any pain currently. Pt's wife states pt has been inconsistent with taking some of his BP, cholesterol & thyroid mdes. Pt to f/u with MD later this pm.   Patient Stated Goals Per wife, To get him back to walking on a regular basis, preferably w/o an AD if appropriate or otherwise with LRAD, & to improve balance.   Currently in Pain? No/denies            [facility] PT Assessment - 02/12/16 1315    Assessment   Medical Diagnosis Physical deconditioning   Referring Provider [name], PA-C   Onset Date/Surgical Date --  18-22 months   Next MD Visit 3 months   ROM / Strength   AROM / PROM / Strength Strength   Strength   Strength Assessment Site Hip;Knee;Ankle   Right/Left Hip Right;Left   Right Hip Flexion 4-/5   Right Hip Extension 4-/5   Right Hip ABduction 4/5   Right Hip ADduction 4-/5   Left Hip Flexion 4/5   Left Hip Extension 4-/5   Left Hip ABduction 4/5   Left Hip ADduction 4-/5   Right/Left Knee Right;Left   Right Knee Flexion 4/5   Right Knee Extension 4+/5   Left Knee Flexion 4+/5   Left Knee Extension 4+/5   Right/Left Ankle Right;Left   Right Ankle Dorsiflexion 4+/5   Left Ankle Dorsiflexion 4+/5   Transfers   Five time sit to stand comments  59 sec   Ambulation/Gait   Assistive device Straight cane   Gait Pattern Step-through pattern;Decreased step length - right;Decreased step length - left;Decreased stride length;Decreased hip/knee flexion - right;Decreased hip/knee flexion - left;Poor foot clearance - left;Poor foot clearance - right;Trunk flexed          Today's Treatment  TherEx  NuStep - lvl 4 x 5'  Gait Gait training x 270 ft with SPC focusing on increased step length, improved foot clearance and heel strike on weight acceptance  MMT Goal Assessment  TherEx Standing at back of chair:    3 way Hip (flexion, abduction, extension) with red TB x10    PWR! Rock x10    PWR! Step x10           PT Short Term Goals - 02/12/16 1428    PT SHORT TERM GOAL #1   Title Pt will be independent with initial HEP by 02/05/16   Status Achieved  independent with initial HEP with wife's guidance at home.     PT SHORT TERM GOAL #2   Title Pt will demonstrate appropriate gait pattern with SPC by 02/05/16   Status Partially Met  good sequencing with SPC but persists with shuffling gait pattern and decreased stride length and foot clearance           PT Long Term Goals - 02/12/16 1458    PT LONG TERM GOAL #1   Title Pt  will be independent with advanced HEP as indicated by 03/25/16    Status On-going   PT LONG TERM GOAL #2   Title Pt will improve bilateral LE strength to 4/5 or greater by 03/25/16    Status On-going   PT LONG TERM GOAL #3   Title Pt will improve gait speed to 2.0 ft/sec or greater for improved safety with ambulation by 03/25/16    Status On-going   PT LONG TERM GOAL #4   Title Pt will demonstrate improvement on the Berg Balance Scale to 46/56 or greater to reduce risk for falls by 03/25/16    Status On-going   PT LONG TERM GOAL #5   Title Pt will complete the TUG in >/=18 sec to reduce risk for falls by 03/25/16   Status On-going               Plan - 02/12/16 1415    Clinical Impression Statement Pt s/p fall last Wed while trying to place music books in cabinet when he fell over backwards hitting his on the floor. Pt denies LOC but wife reports pt told her he thinks he may have had a ministroke. No focal strength deficits noted today with overall LE strength improved since eval. Pt continues to demonstrate delayed reactions and slow response to instrucitons, which along with balance impairments, continues to contribute to high fall risk and indicates a need for continued PT.   PT Treatment/Interventions Patient/family education;Therapeutic exercise;Therapeutic activities;Neuromuscular re-education;Balance training;Functional mobility training;Gait training;Stair training;Manual techniques;ADLs/Self Care Home Management   PT Next Visit Plan Gait training with SPC, balance / stepping activities to increase B hip / knee, DF   Consulted and Agree with Plan of Care Patient;Family member/caregiver   Family Member Consulted Wife - Ellie      Patient will benefit from skilled therapeutic intervention in order to improve the following deficits and impairments:  Decreased balance, Abnormal gait, Difficulty walking, Decreased coordination, Decreased strength, Postural dysfunction, Impaired flexibility,  Decreased activity tolerance, Decreased knowledge of use of DME  Visit Diagnosis: Unsteadiness on feet  Other abnormalities of gait and mobility  Difficulty in walking, not elsewhere classified  Muscle weakness (generalized)     Problem List Patient Active Problem List   Diagnosis Date Noted  . Leukocytosis 07/18/2015  . Gastroesophageal reflux disease without esophagitis 07/09/2015  . Elevated TSH 07/09/2015  . Falls 06/22/2015  .  Diabetes mellitus type II, uncontrolled ([place]) 05/29/2015  . Paroxysmal atrial fibrillation ([place]) 05/29/2015  . Hyperlipidemia 05/29/2015  . Hx of completed stroke 05/29/2015    Percival Spanish, PT, MPT 02/12/2016, 2:59 PM  [facility] [address]  [place], [place], [address] Phone: [phone]   Fax:  [phone]  Name: Gaddiel Cullens MRN: [id number] Date of Birth: July 11, 1924

## 2016-02-12 NOTE — Progress Notes (Signed)
Pre visit review using our clinic review tool, if applicable. No additional management support is needed unless otherwise documented below in the visit note. 

## 2016-02-15 ENCOUNTER — Ambulatory Visit: Payer: Medicare Other

## 2016-02-15 DIAGNOSIS — M6281 Muscle weakness (generalized): Secondary | ICD-10-CM

## 2016-02-15 DIAGNOSIS — R2689 Other abnormalities of gait and mobility: Secondary | ICD-10-CM | POA: Diagnosis not present

## 2016-02-15 DIAGNOSIS — R262 Difficulty in walking, not elsewhere classified: Secondary | ICD-10-CM

## 2016-02-15 DIAGNOSIS — R2681 Unsteadiness on feet: Secondary | ICD-10-CM

## 2016-02-15 NOTE — Therapy (Signed)
[facility] [address]  [place], [place], [address] Phone: [phone]   Fax:  [phone]  Physical Therapy Treatment  Patient Details  Name: Gerald Holt MRN: [id number] Date of Birth: 04-18-1924 Referring Provider: [name], PA-C  Encounter Date: 02/15/2016      PT End of Session - 02/15/16 1516    Visit Number 9   Number of Visits 20   Date for PT Re-Evaluation 03/25/16   PT Start Time 8413   PT Stop Time 1530   PT Time Calculation (min) 38 min   Activity Tolerance Patient tolerated treatment well   Behavior During Therapy [facility] for tasks assessed/performed      Past Medical History  Diagnosis Date  . Stroke [facility]) Nov 2011  . Diabetes type 2, controlled ([place]) 2000  . History of chicken pox   . Whooping cough   . Mumps     Adult  . A-fib ([place])   . Cataracts, bilateral   . Retinopathy   . Undescended testicle, unilateral     Right    Past Surgical History  Procedure Laterality Date  . Tooth extraction    . Pre-malignant / benign skin lesion excision    . Testicle surgery      Right, undescended    There were no vitals filed for this visit.      Subjective Assessment - 02/15/16 1504    Subjective Pt. reports per wife that pt. is activing more cognitive and functioning at a higher level at this point today however the last few.     Patient Stated Goals Per wife, To get him back to walking on a regular basis, preferably w/o an AD if appropriate or otherwise with LRAD, & to improve balance.   Currently in Pain? No/denies   Multiple Pain Sites No     Today's Treatment:  Therex (pt. Required increased time for all therex due to confusion): NuStep: level 4 min  Treadmill: 5 min, .86mh Supine bridging x 10 reps  Supine bridging with B hip abd/ER with black TB x 10 reps  Hooklying B hip abd/ER with black TB x 10 reps  Seated high knee marching with blue TB around knees x 10 reps  each side  Seated B hip ER with blue TB x 10 reps  Seated alternating hip abd/ER with blue TB x 10 reps each  Seated B LAQ with 2# cuffweights x 10 reps each side          PT Short Term Goals - 02/12/16 1428    PT SHORT TERM GOAL #1   Title Pt will be independent with initial HEP by 02/05/16   Status Achieved  independent with initial HEP with wife's guidance at home.     PT SHORT TERM GOAL #2   Title Pt will demonstrate appropriate gait pattern with SPC by 02/05/16   Status Partially Met  good sequencing with SPC but persists with shuffling gait pattern and decreased stride length and foot clearance           PT Long Term Goals - 02/12/16 1458    PT LONG TERM GOAL #1   Title Pt will be independent with advanced HEP as indicated by 03/25/16    Status On-going   PT LONG TERM GOAL #2   Title Pt will improve bilateral LE strength to 4/5 or greater by 03/25/16    Status On-going   PT LONG TERM GOAL #3  Title Pt will improve gait speed to 2.0 ft/sec or greater for improved safety with ambulation by 03/25/16    Status On-going   PT LONG TERM GOAL #4   Title Pt will demonstrate improvement on the Berg Balance Scale to 46/56 or greater to reduce risk for falls by 03/25/16    Status On-going   PT LONG TERM GOAL #5   Title Pt will complete the TUG in >/=18 sec to reduce risk for falls by 03/25/16   Status On-going               Plan - 02/15/16 1518    Clinical Impression Statement Pt. pain free today however continues to demo increased response time and lethergy following fall with posterior head impact on 5/17.  Wife reports MD f/u went well however suspected concussion was MD's opinion with MD recommendation to avoid balance activity and exerting activity for two weeks; pt. asked PTA today if he could walk around without SPC; PTA strongly instructed pt. that ambulation without SPC would be inappropriate and unsafe.  Today's treatment focused on conservative supine hip / knee  strengthening activity with pt. responding well; pt. continued to demo decreased response time with all therex however HOH requiring additional time as well.  Plan to advance therex following concussion protocal in coming treatments.     PT Treatment/Interventions Patient/family education;Therapeutic exercise;Therapeutic activities;Neuromuscular re-education;Balance training;Functional mobility training;Gait training;Stair training;Manual techniques;ADLs/Self Care Home Management   PT Next Visit Plan G-code next visit; Gait training with SPC, balance / stepping activities to increase B hip / knee, DF      Patient will benefit from skilled therapeutic intervention in order to improve the following deficits and impairments:  Decreased balance, Abnormal gait, Difficulty walking, Decreased coordination, Decreased strength, Postural dysfunction, Impaired flexibility, Decreased activity tolerance, Decreased knowledge of use of DME  Visit Diagnosis: Unsteadiness on feet  Other abnormalities of gait and mobility  Difficulty in walking, not elsewhere classified  Muscle weakness (generalized)     Problem List Patient Active Problem List   Diagnosis Date Noted  . Leukocytosis 07/18/2015  . Gastroesophageal reflux disease without esophagitis 07/09/2015  . Elevated TSH 07/09/2015  . Falls 06/22/2015  . Diabetes mellitus type II, uncontrolled ([place]) 05/29/2015  . Paroxysmal atrial fibrillation ([place]) 05/29/2015  . Hyperlipidemia 05/29/2015  . Hx of completed stroke 05/29/2015    Bess Harvest, PTA 02/16/2016, 12:40 PM  [facility] [address]  [place], [place], [address] Phone: [phone]   Fax:  [phone]  Name: Gerald Holt MRN: [id number] Date of Birth: 03/18/1924

## 2016-02-16 ENCOUNTER — Encounter: Payer: Self-pay | Admitting: Physician Assistant

## 2016-02-20 ENCOUNTER — Ambulatory Visit: Payer: Medicare Other

## 2016-02-20 DIAGNOSIS — M6281 Muscle weakness (generalized): Secondary | ICD-10-CM

## 2016-02-20 DIAGNOSIS — R2689 Other abnormalities of gait and mobility: Secondary | ICD-10-CM | POA: Diagnosis not present

## 2016-02-20 DIAGNOSIS — R262 Difficulty in walking, not elsewhere classified: Secondary | ICD-10-CM | POA: Diagnosis not present

## 2016-02-20 DIAGNOSIS — R2681 Unsteadiness on feet: Secondary | ICD-10-CM

## 2016-02-20 NOTE — Therapy (Signed)
[facility] [address]  [place], [place], [address] Phone: [phone]   Fax:  [phone]  Physical Therapy Treatment  Patient Details  Name: Gerald Holt MRN: [id number] Date of Birth: Nov 09, 1923 Referring Provider: [name], PA-C  Encounter Date: 02/20/2016      PT End of Session - 02/20/16 1703    Visit Number 10   Number of Visits 20   Date for PT Re-Evaluation 03/25/16   PT Start Time 6945   PT Stop Time 1405   PT Time Calculation (min) 50 min   Activity Tolerance Patient tolerated treatment well   Behavior During Therapy [facility] for tasks assessed/performed      Past Medical History  Diagnosis Date  . Stroke [facility]) Nov 2011  . Diabetes type 2, controlled ([place]) 2000  . History of chicken pox   . Whooping cough   . Mumps     Adult  . A-fib ([place])   . Cataracts, bilateral   . Retinopathy   . Undescended testicle, unilateral     Right    Past Surgical History  Procedure Laterality Date  . Tooth extraction    . Pre-malignant / benign skin lesion excision    . Testicle surgery      Right, undescended    There were no vitals filed for this visit.          [facility] PT Assessment - 02/20/16 0001    Observation/Other Assessments   Focus on Therapeutic Outcomes (FOTO)  41% (59% limited)   Berg Balance Test   Sit to Stand Able to stand without using hands and stabilize independently   Standing Unsupported Able to stand safely 2 minutes   Sitting with Back Unsupported but Feet Supported on Floor or Stool Able to sit safely and securely 2 minutes   Stand to Sit Sits safely with minimal use of hands   Transfers Able to transfer safely, minor use of hands   Standing Unsupported with Eyes Closed Able to stand 10 seconds safely   Standing Ubsupported with Feet Together Able to place feet together independently and stand for 1 minute with supervision   From Standing, Reach Forward with  Outstretched Arm Can reach forward >5 cm safely (2")   From Standing Position, Pick up Object from Floor Able to pick up shoe, needs supervision   From Standing Position, Turn to Look Behind Over each Shoulder Turn sideways only but maintains balance   Turn 360 Degrees Able to turn 360 degrees safely but slowly   Standing Unsupported, Alternately Place Feet on Step/Stool Able to complete >2 steps/needs minimal assist   Standing Unsupported, One Foot in Front Able to plae foot ahead of the other independently and hold 30 seconds   Standing on One Leg Unable to try or needs assist to prevent fall   Total Score 40   Timed Up and Go Test   TUG Normal TUG   Normal TUG (seconds) 16.59  without SPC; 15.68 sec with SPC     Today's Treatment:  Therex: NuStep: level 4, 4 min   BERG balance assessment   Goal assessment         PT Short Term Goals - 02/20/16 1708    PT SHORT TERM GOAL #1   Title Pt will be independent with initial HEP by 02/05/16   Status Achieved  independent with initial HEP with wife's guidance at home.     PT SHORT TERM GOAL #  2   Title Pt will demonstrate appropriate gait pattern with SPC by 02/05/16   Status Partially Met  02-21-16: Good sequencing with SPC but persists with shuffling gait pattern and decreased stride length and foot clearance           PT Long Term Goals - 2016-02-21 1336    PT LONG TERM GOAL #1   Title Pt will be independent with advanced HEP as indicated by 03/25/16    Status On-going   PT LONG TERM GOAL #2   Title Pt will improve bilateral LE strength to 4/5 or greater by 03/25/16    Status On-going   PT LONG TERM GOAL #3   Title Pt will improve gait speed to 2.0 ft/sec or greater for improved safety with ambulation by 03/25/16    Status On-going   PT LONG TERM GOAL #4   Title Pt will demonstrate improvement on the Berg Balance Scale to 46/56 or greater to reduce risk for falls by 03/25/16    Status On-going  Feb 21, 2016: Pt. able to demo 40/46  Berg Balance Scale.    PT LONG TERM GOAL #5   Title Pt will complete the TUG in </=18 sec to reduce risk for falls by 03/25/16   Status Achieved  21-Feb-2016: Pt. able to perform TUG with SPC in 15.68 sec; without SPC in 16.59 sec               Plan - Feb 21, 2016 1705    Clinical Impression Statement Pt. reports he is pain free and feeling good today; pt. now 13 days out of fall / concussion.  Today's treatment focused on BERG balance assessment with TUG score and goal assessment; pt. able to get a 40/56 BERG balance score, a tug of 15.68 sec with SPC; 16.59 sec without SPC.  Pt. progressing toward goals at this point however this progress has been stalled by limitations of treatment due to concussion protocol.     PT Treatment/Interventions Patient/family education;Therapeutic exercise;Therapeutic activities;Neuromuscular re-education;Balance training;Functional mobility training;Gait training;Stair training;Manual techniques;ADLs/Self Care Home Management   PT Next Visit Plan Gait training with SPC, balance / stepping activities to increase B hip / knee, DF      Patient will benefit from skilled therapeutic intervention in order to improve the following deficits and impairments:  Decreased balance, Abnormal gait, Difficulty walking, Decreased coordination, Decreased strength, Postural dysfunction, Impaired flexibility, Decreased activity tolerance, Decreased knowledge of use of DME  Visit Diagnosis: Unsteadiness on feet  Other abnormalities of gait and mobility  Difficulty in walking, not elsewhere classified  Muscle weakness (generalized)       G-Codes - 02/21/16 1823    Functional Assessment Tool Used TUG = 15.68" (40.9% limitation)   Functional Limitation Mobility: Walking and moving around   Mobility: Walking and Moving Around Current Status [phone]) At least 40 percent but less than 60 percent impaired, limited or restricted   Mobility: Walking and Moving Around Goal Status  [phone]) At least 20 percent but less than 40 percent impaired, limited or restricted      Problem List Patient Active Problem List   Diagnosis Date Noted  . Leukocytosis 07/18/2015  . Gastroesophageal reflux disease without esophagitis 07/09/2015  . Elevated TSH 07/09/2015  . Falls 06/22/2015  . Diabetes mellitus type II, uncontrolled ([place]) 05/29/2015  . Paroxysmal atrial fibrillation ([place]) 05/29/2015  . Hyperlipidemia 05/29/2015  . Hx of completed stroke 05/29/2015    Bess Harvest, PTA Feb 21, 2016, 6:28 PM  [facility] [address]  [place], [place], [address] Phone: [phone]   Fax:  [phone]  Name: Avyukth Bontempo MRN: [id number] Date of Birth: July 09, 1924   Percival Spanish, PT, MPT 02/20/2016, 6:28 PM  [facility], [place], [address] Phone: [phone]   Fax:  [phone]

## 2016-02-22 ENCOUNTER — Ambulatory Visit: Payer: Medicare Other | Attending: Family | Admitting: Physical Therapy

## 2016-02-22 DIAGNOSIS — R2689 Other abnormalities of gait and mobility: Secondary | ICD-10-CM | POA: Diagnosis not present

## 2016-02-22 DIAGNOSIS — R2681 Unsteadiness on feet: Secondary | ICD-10-CM | POA: Insufficient documentation

## 2016-02-22 DIAGNOSIS — M6281 Muscle weakness (generalized): Secondary | ICD-10-CM | POA: Diagnosis not present

## 2016-02-22 DIAGNOSIS — R262 Difficulty in walking, not elsewhere classified: Secondary | ICD-10-CM | POA: Diagnosis not present

## 2016-02-22 LAB — HM DIABETES EYE EXAM

## 2016-02-22 NOTE — Therapy (Signed)
[facility] [place] [address]  [address] [place], [place], [address] Phone: [phone]   Fax:  [phone]  Physical Therapy Treatment  Patient Details  Name: Gerald Holt MRN: [id number] Date of Birth: 10/05/1923 Referring Provider: William C Martin, PA-C  Encounter Date: 02/22/2016      PT End of Session - 02/22/16 1323    Visit Number 11   Number of Visits 20   Date for PT Re-Evaluation 03/25/16   PT Start Time 1315   PT Stop Time 1411   PT Time Calculation (min) 56 min   Activity Tolerance Patient tolerated treatment well   Behavior During Therapy WFL for tasks assessed/performed      Past Medical History  Diagnosis Date  . Stroke (HCC) Nov 2011  . Diabetes type 2, controlled (HCC) 2000  . History of chicken pox   . Whooping cough   . Mumps     Adult  . A-fib (HCC)   . Cataracts, bilateral   . Retinopathy   . Undescended testicle, unilateral     Right    Past Surgical History  Procedure Laterality Date  . Tooth extraction    . Pre-malignant / benign skin lesion excision    . Testicle surgery      Right, undescended    There were no vitals filed for this visit.      Subjective Assessment - 02/22/16 1323    Subjective Pt's wife states MD has told her to cancel all further OP PT visits and pt will be receiving HH services from [facility].   Patient is accompained by: Family member   Patient Stated Goals Per wife, To get him back to walking on a regular basis, preferably w/o an AD if appropriate or otherwise with LRAD, & to improve balance.   Currently in Pain? No/denies            [facility] PT Assessment - 02/22/16 1315    Assessment   Medical Diagnosis Physical deconditioning   Onset Date/Surgical Date --  18-22 months   Next MD Visit 3 months   Observation/Other Assessments   Focus on Therapeutic Outcomes (FOTO)  41% (59% limited)   ROM / Strength   AROM / PROM / Strength Strength    Strength   Strength Assessment Site Hip;Knee;Ankle   Right Hip Flexion 4/5   Right Hip Extension 4-/5   Right Hip ABduction 4/5   Right Hip ADduction 4/5   Left Hip Flexion 4/5   Left Hip Extension 4-/5   Left Hip ABduction 4/5   Left Hip ADduction 4/5   Right Knee Flexion 4+/5   Right Knee Extension --  5-/5   Left Knee Flexion 4+/5   Left Knee Extension --  5-/5   Right Ankle Dorsiflexion --  5-/5   Left Ankle Dorsiflexion 4+/5   Standardized Balance Assessment   10 Meter Walk 1.69 ft/sec  20.72" (1st trial), 18.06" (2nd trial) - 19.39" (avg)         Today's Treatment:  TherEx  NuStep: level 4  X 6min    MMT  10M Walk test  Review of current HEP with additions as follows: Seated marching & hip ABD with black TB x15 each         PT Short Term Goals - 02/22/16 1426    PT SHORT TERM GOAL #1   Title Pt will be independent with initial HEP by 02/05/16   Status Achieved     PT SHORT TERM GOAL #2   Title Pt will demonstrate appropriate gait pattern with SPC by 02/05/16   Status Partially Met  02/20/16: Good sequencing with SPC and improving foot clearance with consistent wearing of shoes vs slippers but still demonstrates decreased stride length and occasional shuffling pattern.           PT Long Term Goals - 2016-03-09 1335    PT LONG TERM GOAL #1   Title Pt will be independent with advanced HEP as indicated by 03/25/16    Status Achieved  Independent with wife's guidance.   PT LONG TERM GOAL #2   Title Pt will improve bilateral LE strength to 4/5 or greater by 03/25/16    Status Partially Met  Met except B hip extension 4-/5   PT LONG TERM GOAL #3   Title Pt will improve gait speed to 2.0 ft/sec or greater for improved safety with ambulation by 03/25/16    Status Not Met  1.69 ft/sec   PT LONG TERM GOAL #4   Title Pt will demonstrate improvement on the Berg Balance Scale to 46/56 or greater to reduce risk for falls by 03/25/16    Status Not Met  02/20/16: Pt.  able to demo 40/46 Berg Balance Scale.    PT LONG TERM GOAL #5   Title Pt will complete the TUG in </=18 sec to reduce risk for falls by 03/25/16   Status Achieved  02/20/16: Pt. able to perform TUG with SPC in 15.68 sec; without SPC in 16.59 sec               Plan - 09-Mar-2016 1415    Clinical Impression Statement Pt wife reports MD is recommending [facility] services given recent fall with concussion and increased need for assistance at home, therefore today will be pt's final OP PT appointment for this episode. Reviewed standardized testing completeted at last visit along with MMT and 10 M walk test comleted today and assessed current status with goals, with pt having met HEP and TUG goals. B LE strength has improved but remains most limited proximally, especially in hip extension, with strength goal partially met. Gait speed has increased to 1.69 ft/sec (1.18 ft/sec at eval) but remaining goals not met due to early discharge from OP PT in order to start [facility] services. Reviewed current HEP and made adjustments/recommendations for continued activities at home.   PT Treatment/Interventions Patient/family education;Therapeutic exercise;Therapeutic activities;Neuromuscular re-education;Balance training;Functional mobility training;Gait training;Stair training;Manual techniques;ADLs/Self Care Home Management   PT Next Visit Plan Discharge from OP PT due to starting [place] as part [place] and Agree with Plan of Care Patient;Family member/caregiver   Family Member Consulted Wife - Ellie      Patient will benefit from skilled therapeutic intervention in order to improve the following deficits and impairments:  Decreased balance, Abnormal gait, Difficulty walking, Decreased coordination, Decreased strength, Postural dysfunction, Impaired flexibility, Decreased activity tolerance, Decreased knowledge of use of DME  Visit  Diagnosis: Unsteadiness on feet  Other abnormalities of gait and mobility  Difficulty in walking, not elsewhere classified  Muscle weakness (generalized)       G-Codes - 03/09/16 1428    Functional Assessment Tool Used TUG = 15.68" (40.9% limitation)   Functional Limitation Mobility: Walking and moving around   Mobility: Walking and Moving Around Goal Status [phone]) At least 20 percent but less than 40 percent impaired, limited or restricted  Mobility: Walking and Moving Around Discharge Status ([id number]) At least 40 percent but less than 60 percent impaired, limited or restricted      Problem List Patient Active Problem List   Diagnosis Date Noted  . Leukocytosis 07/18/2015  . Gastroesophageal reflux disease without esophagitis 07/09/2015  . Elevated TSH 07/09/2015  . Falls 06/22/2015  . Diabetes mellitus type II, uncontrolled (HCC) 05/29/2015  . Paroxysmal atrial fibrillation (HCC) 05/29/2015  . Hyperlipidemia 05/29/2015  . Hx of completed stroke 05/29/2015     M , PT, MPT 02/22/2016, 2:34 PM  [facility] [place] [address]  [address] [place], [place], [address] Phone: [phone]   Fax:  [phone]  Name: Gerald Holt MRN: [id number] Date of Birth: 09/13/1924   PHYSICAL THERAPY DISCHARGE SUMMARY  Visits from Start of Care: 11  Current functional level related to goals / functional outcomes:   D/C from PT at pt's wife's request due to starting HH services given recent fall with concussion and increased need for assistance at home. Pt has met HEP and TUG LTG goals. B LE strength has improved but remains most limited proximally, especially in hip extension, with strength goal partially met. Gait speed has increased to 1.69 ft/sec (1.18 ft/sec at eval) but remaining goals not met due to early discharge from OP PT in order to start HH services.    Remaining deficits:   As above   Education / Equipment:    HEP  Plan: Patient agrees to discharge.  Patient goals were partially met. Patient is being discharged due to a change in medical status.  ?????        M. , PT, MPT 02/22/2016, 2:38 PM   [facility] [place] [address]  [address] [place], , [address] Phone: [phone]   Fax:  [phone]    

## 2016-02-22 NOTE — Telephone Encounter (Signed)
Anderson Malta can we check on status of referral and contact patient's wife? Home health referral. Thanks.

## 2016-02-26 ENCOUNTER — Ambulatory Visit: Payer: Medicare Other | Admitting: Physical Therapy

## 2016-02-28 ENCOUNTER — Encounter: Payer: Self-pay | Admitting: Podiatry

## 2016-02-28 ENCOUNTER — Telehealth: Payer: Self-pay | Admitting: Physician Assistant

## 2016-02-28 ENCOUNTER — Ambulatory Visit (INDEPENDENT_AMBULATORY_CARE_PROVIDER_SITE_OTHER): Payer: Medicare Other | Admitting: Podiatry

## 2016-02-28 DIAGNOSIS — I480 Paroxysmal atrial fibrillation: Secondary | ICD-10-CM | POA: Diagnosis not present

## 2016-02-28 DIAGNOSIS — B351 Tinea unguium: Secondary | ICD-10-CM

## 2016-02-28 DIAGNOSIS — E785 Hyperlipidemia, unspecified: Secondary | ICD-10-CM | POA: Diagnosis not present

## 2016-02-28 DIAGNOSIS — M79674 Pain in right toe(s): Secondary | ICD-10-CM | POA: Diagnosis not present

## 2016-02-28 DIAGNOSIS — Z7982 Long term (current) use of aspirin: Secondary | ICD-10-CM | POA: Diagnosis not present

## 2016-02-28 DIAGNOSIS — R296 Repeated falls: Secondary | ICD-10-CM | POA: Diagnosis not present

## 2016-02-28 DIAGNOSIS — I10 Essential (primary) hypertension: Secondary | ICD-10-CM | POA: Diagnosis not present

## 2016-02-28 DIAGNOSIS — M79675 Pain in left toe(s): Secondary | ICD-10-CM | POA: Diagnosis not present

## 2016-02-28 DIAGNOSIS — Z7984 Long term (current) use of oral hypoglycemic drugs: Secondary | ICD-10-CM | POA: Diagnosis not present

## 2016-02-28 DIAGNOSIS — F0390 Unspecified dementia without behavioral disturbance: Secondary | ICD-10-CM | POA: Diagnosis not present

## 2016-02-28 DIAGNOSIS — K219 Gastro-esophageal reflux disease without esophagitis: Secondary | ICD-10-CM | POA: Diagnosis not present

## 2016-02-28 DIAGNOSIS — Z8673 Personal history of transient ischemic attack (TIA), and cerebral infarction without residual deficits: Secondary | ICD-10-CM | POA: Diagnosis not present

## 2016-02-28 DIAGNOSIS — E1165 Type 2 diabetes mellitus with hyperglycemia: Secondary | ICD-10-CM | POA: Diagnosis not present

## 2016-02-28 NOTE — Telephone Encounter (Signed)
Caller name: Diane Relationship to patient: [place] Can be reached: [phone]   Reason for call: [facility] needs additional orders for patient. Patient needs PT and OT, Social Worker and need to discuss diabetic medication. Plse call RN directly at above number to discuss

## 2016-02-28 NOTE — Progress Notes (Signed)
Subjective:     Patient ID: Gerald Holt, male   DOB: Oct 12, 1923, 82 y.o.   MRN: [id number]  HPI 82 year old male presents the office with his wife for concerns of thick, painful, elongated toenails that he cannot trim himself and the wife tried to trim them however she did cut him however this didn't heal. Denies any swelling redness or drainage around the toenails. No other complaints  Review of Systems  All other systems reviewed and are negative.      Objective:   Physical Exam General: AAO x3, NAD  Dermatological: Nails are hypertrophic, dystrophic, brittle, discolored, elongated 10. No surrounding redness or drainage. Tenderness nails 1-5 bilaterally. No open lesions or pre-ulcerative lesions.  Vascular: Dorsalis Pedis artery and Posterior Tibial artery pedal pulses are 1/4 bilateral with immedate capillary fill time. There is no pain with calf compression, swelling, warmth, erythema.   Neruologic: Sensation is somewhat decreased with Derrel Nip monofilament  Musculoskeletal: No gross boney pedal deformities bilateral. No pain, crepitus, or limitation noted with foot and ankle range of motion bilateral.   Gait: Unassisted, Nonantalgic.      Assessment:     Symptomatic onychomycosis    Plan:     -Treatment options discussed including all alternatives, risks, and complications -Etiology of symptoms were discussed -Nails debrided 10 without complications or bleeding. -Daily foot inspection -Follow-up in 3 months or sooner if any problems arise. In the meantime, encouraged to call the office with any questions, concerns, change in symptoms.   Celesta Gentile, DPM

## 2016-02-28 NOTE — Telephone Encounter (Signed)
Spoke with Gerald Holt at [facility] per provider VO to discontinue glipizide as long as A1C remains >8, change in EMR; also VO given to continue/extend PT, OT, and SW, understood & agreed/SLS 06/07

## 2016-02-29 ENCOUNTER — Ambulatory Visit: Payer: Medicare Other

## 2016-02-29 DIAGNOSIS — I480 Paroxysmal atrial fibrillation: Secondary | ICD-10-CM | POA: Diagnosis not present

## 2016-02-29 DIAGNOSIS — I10 Essential (primary) hypertension: Secondary | ICD-10-CM | POA: Diagnosis not present

## 2016-02-29 DIAGNOSIS — E1165 Type 2 diabetes mellitus with hyperglycemia: Secondary | ICD-10-CM | POA: Diagnosis not present

## 2016-02-29 DIAGNOSIS — K219 Gastro-esophageal reflux disease without esophagitis: Secondary | ICD-10-CM | POA: Diagnosis not present

## 2016-02-29 DIAGNOSIS — F0390 Unspecified dementia without behavioral disturbance: Secondary | ICD-10-CM | POA: Diagnosis not present

## 2016-02-29 DIAGNOSIS — R296 Repeated falls: Secondary | ICD-10-CM | POA: Diagnosis not present

## 2016-03-01 DIAGNOSIS — E1165 Type 2 diabetes mellitus with hyperglycemia: Secondary | ICD-10-CM | POA: Diagnosis not present

## 2016-03-01 DIAGNOSIS — I480 Paroxysmal atrial fibrillation: Secondary | ICD-10-CM | POA: Diagnosis not present

## 2016-03-01 DIAGNOSIS — F0390 Unspecified dementia without behavioral disturbance: Secondary | ICD-10-CM | POA: Diagnosis not present

## 2016-03-01 DIAGNOSIS — H353122 Nonexudative age-related macular degeneration, left eye, intermediate dry stage: Secondary | ICD-10-CM | POA: Diagnosis not present

## 2016-03-01 DIAGNOSIS — R296 Repeated falls: Secondary | ICD-10-CM | POA: Diagnosis not present

## 2016-03-01 DIAGNOSIS — I10 Essential (primary) hypertension: Secondary | ICD-10-CM | POA: Diagnosis not present

## 2016-03-01 DIAGNOSIS — K219 Gastro-esophageal reflux disease without esophagitis: Secondary | ICD-10-CM | POA: Diagnosis not present

## 2016-03-04 ENCOUNTER — Ambulatory Visit: Payer: Medicare Other | Admitting: Physical Therapy

## 2016-03-04 DIAGNOSIS — K219 Gastro-esophageal reflux disease without esophagitis: Secondary | ICD-10-CM | POA: Diagnosis not present

## 2016-03-04 DIAGNOSIS — I10 Essential (primary) hypertension: Secondary | ICD-10-CM | POA: Diagnosis not present

## 2016-03-04 DIAGNOSIS — I480 Paroxysmal atrial fibrillation: Secondary | ICD-10-CM | POA: Diagnosis not present

## 2016-03-04 DIAGNOSIS — E1165 Type 2 diabetes mellitus with hyperglycemia: Secondary | ICD-10-CM | POA: Diagnosis not present

## 2016-03-04 DIAGNOSIS — F0390 Unspecified dementia without behavioral disturbance: Secondary | ICD-10-CM | POA: Diagnosis not present

## 2016-03-04 DIAGNOSIS — R296 Repeated falls: Secondary | ICD-10-CM | POA: Diagnosis not present

## 2016-03-05 ENCOUNTER — Telehealth: Payer: Self-pay | Admitting: Physician Assistant

## 2016-03-05 DIAGNOSIS — I480 Paroxysmal atrial fibrillation: Secondary | ICD-10-CM | POA: Diagnosis not present

## 2016-03-05 DIAGNOSIS — I10 Essential (primary) hypertension: Secondary | ICD-10-CM | POA: Diagnosis not present

## 2016-03-05 DIAGNOSIS — R296 Repeated falls: Secondary | ICD-10-CM | POA: Diagnosis not present

## 2016-03-05 DIAGNOSIS — K219 Gastro-esophageal reflux disease without esophagitis: Secondary | ICD-10-CM | POA: Diagnosis not present

## 2016-03-05 DIAGNOSIS — E1165 Type 2 diabetes mellitus with hyperglycemia: Secondary | ICD-10-CM | POA: Diagnosis not present

## 2016-03-05 DIAGNOSIS — F0390 Unspecified dementia without behavioral disturbance: Secondary | ICD-10-CM | POA: Diagnosis not present

## 2016-03-05 NOTE — Telephone Encounter (Signed)
Ok to [employer] order for Speech therapy. Have them fax over written orders.

## 2016-03-05 NOTE — Telephone Encounter (Signed)
[facility]- [phone]    She would like to add speech therapy to his orders.

## 2016-03-05 NOTE — Telephone Encounter (Signed)
Caller informed, understood & agreed/SLS 06/13

## 2016-03-06 ENCOUNTER — Encounter: Payer: Self-pay | Admitting: Physician Assistant

## 2016-03-06 DIAGNOSIS — K219 Gastro-esophageal reflux disease without esophagitis: Secondary | ICD-10-CM | POA: Diagnosis not present

## 2016-03-06 DIAGNOSIS — I10 Essential (primary) hypertension: Secondary | ICD-10-CM | POA: Diagnosis not present

## 2016-03-06 DIAGNOSIS — F0390 Unspecified dementia without behavioral disturbance: Secondary | ICD-10-CM | POA: Diagnosis not present

## 2016-03-06 DIAGNOSIS — I480 Paroxysmal atrial fibrillation: Secondary | ICD-10-CM | POA: Diagnosis not present

## 2016-03-06 DIAGNOSIS — E1165 Type 2 diabetes mellitus with hyperglycemia: Secondary | ICD-10-CM | POA: Diagnosis not present

## 2016-03-06 DIAGNOSIS — R296 Repeated falls: Secondary | ICD-10-CM | POA: Diagnosis not present

## 2016-03-07 ENCOUNTER — Ambulatory Visit: Payer: Medicare Other

## 2016-03-07 DIAGNOSIS — R296 Repeated falls: Secondary | ICD-10-CM | POA: Diagnosis not present

## 2016-03-07 DIAGNOSIS — I10 Essential (primary) hypertension: Secondary | ICD-10-CM | POA: Diagnosis not present

## 2016-03-07 DIAGNOSIS — E1165 Type 2 diabetes mellitus with hyperglycemia: Secondary | ICD-10-CM | POA: Diagnosis not present

## 2016-03-07 DIAGNOSIS — K219 Gastro-esophageal reflux disease without esophagitis: Secondary | ICD-10-CM | POA: Diagnosis not present

## 2016-03-07 DIAGNOSIS — F0390 Unspecified dementia without behavioral disturbance: Secondary | ICD-10-CM | POA: Diagnosis not present

## 2016-03-07 DIAGNOSIS — I480 Paroxysmal atrial fibrillation: Secondary | ICD-10-CM | POA: Diagnosis not present

## 2016-03-08 DIAGNOSIS — E1165 Type 2 diabetes mellitus with hyperglycemia: Secondary | ICD-10-CM | POA: Diagnosis not present

## 2016-03-08 DIAGNOSIS — I480 Paroxysmal atrial fibrillation: Secondary | ICD-10-CM | POA: Diagnosis not present

## 2016-03-08 DIAGNOSIS — R296 Repeated falls: Secondary | ICD-10-CM | POA: Diagnosis not present

## 2016-03-08 DIAGNOSIS — I10 Essential (primary) hypertension: Secondary | ICD-10-CM | POA: Diagnosis not present

## 2016-03-08 DIAGNOSIS — K219 Gastro-esophageal reflux disease without esophagitis: Secondary | ICD-10-CM | POA: Diagnosis not present

## 2016-03-08 DIAGNOSIS — F0390 Unspecified dementia without behavioral disturbance: Secondary | ICD-10-CM | POA: Diagnosis not present

## 2016-03-11 ENCOUNTER — Ambulatory Visit: Payer: Medicare Other | Admitting: Physical Therapy

## 2016-03-11 DIAGNOSIS — I480 Paroxysmal atrial fibrillation: Secondary | ICD-10-CM | POA: Diagnosis not present

## 2016-03-11 DIAGNOSIS — K219 Gastro-esophageal reflux disease without esophagitis: Secondary | ICD-10-CM | POA: Diagnosis not present

## 2016-03-11 DIAGNOSIS — E1165 Type 2 diabetes mellitus with hyperglycemia: Secondary | ICD-10-CM | POA: Diagnosis not present

## 2016-03-11 DIAGNOSIS — I10 Essential (primary) hypertension: Secondary | ICD-10-CM | POA: Diagnosis not present

## 2016-03-11 DIAGNOSIS — R296 Repeated falls: Secondary | ICD-10-CM | POA: Diagnosis not present

## 2016-03-11 DIAGNOSIS — F0390 Unspecified dementia without behavioral disturbance: Secondary | ICD-10-CM | POA: Diagnosis not present

## 2016-03-12 DIAGNOSIS — R296 Repeated falls: Secondary | ICD-10-CM | POA: Diagnosis not present

## 2016-03-12 DIAGNOSIS — E1165 Type 2 diabetes mellitus with hyperglycemia: Secondary | ICD-10-CM | POA: Diagnosis not present

## 2016-03-12 DIAGNOSIS — I480 Paroxysmal atrial fibrillation: Secondary | ICD-10-CM | POA: Diagnosis not present

## 2016-03-12 DIAGNOSIS — F0390 Unspecified dementia without behavioral disturbance: Secondary | ICD-10-CM | POA: Diagnosis not present

## 2016-03-12 DIAGNOSIS — I10 Essential (primary) hypertension: Secondary | ICD-10-CM | POA: Diagnosis not present

## 2016-03-12 DIAGNOSIS — K219 Gastro-esophageal reflux disease without esophagitis: Secondary | ICD-10-CM | POA: Diagnosis not present

## 2016-03-13 ENCOUNTER — Telehealth: Payer: Self-pay

## 2016-03-13 DIAGNOSIS — E1165 Type 2 diabetes mellitus with hyperglycemia: Secondary | ICD-10-CM | POA: Diagnosis not present

## 2016-03-13 DIAGNOSIS — R296 Repeated falls: Secondary | ICD-10-CM | POA: Diagnosis not present

## 2016-03-13 DIAGNOSIS — I480 Paroxysmal atrial fibrillation: Secondary | ICD-10-CM | POA: Diagnosis not present

## 2016-03-13 DIAGNOSIS — F0390 Unspecified dementia without behavioral disturbance: Secondary | ICD-10-CM | POA: Diagnosis not present

## 2016-03-13 DIAGNOSIS — I10 Essential (primary) hypertension: Secondary | ICD-10-CM | POA: Diagnosis not present

## 2016-03-13 DIAGNOSIS — K219 Gastro-esophageal reflux disease without esophagitis: Secondary | ICD-10-CM | POA: Diagnosis not present

## 2016-03-13 NOTE — Telephone Encounter (Addendum)
Received orders for OT and ST as well as [place] and POC from [facility] via fax.  Each forwarded to Orson Eva for review and signature.

## 2016-03-14 ENCOUNTER — Ambulatory Visit: Payer: Medicare Other

## 2016-03-18 ENCOUNTER — Ambulatory Visit: Payer: Medicare Other

## 2016-03-18 NOTE — Telephone Encounter (Signed)
Complete

## 2016-03-19 DIAGNOSIS — K219 Gastro-esophageal reflux disease without esophagitis: Secondary | ICD-10-CM | POA: Diagnosis not present

## 2016-03-19 DIAGNOSIS — R296 Repeated falls: Secondary | ICD-10-CM | POA: Diagnosis not present

## 2016-03-19 DIAGNOSIS — I10 Essential (primary) hypertension: Secondary | ICD-10-CM | POA: Diagnosis not present

## 2016-03-19 DIAGNOSIS — I480 Paroxysmal atrial fibrillation: Secondary | ICD-10-CM | POA: Diagnosis not present

## 2016-03-19 DIAGNOSIS — E1165 Type 2 diabetes mellitus with hyperglycemia: Secondary | ICD-10-CM | POA: Diagnosis not present

## 2016-03-19 DIAGNOSIS — F0390 Unspecified dementia without behavioral disturbance: Secondary | ICD-10-CM | POA: Diagnosis not present

## 2016-03-21 ENCOUNTER — Ambulatory Visit: Payer: Medicare Other | Admitting: Physical Therapy

## 2016-03-21 DIAGNOSIS — K219 Gastro-esophageal reflux disease without esophagitis: Secondary | ICD-10-CM | POA: Diagnosis not present

## 2016-03-21 DIAGNOSIS — R296 Repeated falls: Secondary | ICD-10-CM | POA: Diagnosis not present

## 2016-03-21 DIAGNOSIS — I480 Paroxysmal atrial fibrillation: Secondary | ICD-10-CM | POA: Diagnosis not present

## 2016-03-21 DIAGNOSIS — I10 Essential (primary) hypertension: Secondary | ICD-10-CM | POA: Diagnosis not present

## 2016-03-21 DIAGNOSIS — F0390 Unspecified dementia without behavioral disturbance: Secondary | ICD-10-CM | POA: Diagnosis not present

## 2016-03-21 DIAGNOSIS — E1165 Type 2 diabetes mellitus with hyperglycemia: Secondary | ICD-10-CM | POA: Diagnosis not present

## 2016-03-24 ENCOUNTER — Encounter: Payer: Self-pay | Admitting: Physician Assistant

## 2016-03-25 ENCOUNTER — Encounter: Payer: Self-pay | Admitting: Physician Assistant

## 2016-03-25 ENCOUNTER — Encounter (HOSPITAL_BASED_OUTPATIENT_CLINIC_OR_DEPARTMENT_OTHER): Payer: Self-pay | Admitting: Emergency Medicine

## 2016-03-25 ENCOUNTER — Emergency Department (HOSPITAL_BASED_OUTPATIENT_CLINIC_OR_DEPARTMENT_OTHER)
Admission: EM | Admit: 2016-03-25 | Discharge: 2016-03-25 | Disposition: A | Discharge status: Home / Self Care | Payer: Medicare Other | Attending: Emergency Medicine | Admitting: Emergency Medicine

## 2016-03-25 ENCOUNTER — Encounter: Payer: Self-pay | Admitting: Neurology

## 2016-03-25 ENCOUNTER — Ambulatory Visit (INDEPENDENT_AMBULATORY_CARE_PROVIDER_SITE_OTHER): Payer: Medicare Other | Admitting: Neurology

## 2016-03-25 ENCOUNTER — Emergency Department (HOSPITAL_BASED_OUTPATIENT_CLINIC_OR_DEPARTMENT_OTHER): Payer: Medicare Other

## 2016-03-25 ENCOUNTER — Other Ambulatory Visit (INDEPENDENT_AMBULATORY_CARE_PROVIDER_SITE_OTHER): Payer: Medicare Other

## 2016-03-25 ENCOUNTER — Telehealth: Payer: Self-pay | Admitting: Family

## 2016-03-25 VITALS — BP 120/74 | HR 96 | Ht 67.0 in | Wt 164.1 lb

## 2016-03-25 DIAGNOSIS — Z8673 Personal history of transient ischemic attack (TIA), and cerebral infarction without residual deficits: Secondary | ICD-10-CM | POA: Diagnosis not present

## 2016-03-25 DIAGNOSIS — F0390 Unspecified dementia without behavioral disturbance: Secondary | ICD-10-CM | POA: Diagnosis not present

## 2016-03-25 DIAGNOSIS — Z87891 Personal history of nicotine dependence: Secondary | ICD-10-CM | POA: Diagnosis not present

## 2016-03-25 DIAGNOSIS — R4182 Altered mental status, unspecified: Secondary | ICD-10-CM | POA: Diagnosis not present

## 2016-03-25 DIAGNOSIS — Z7982 Long term (current) use of aspirin: Secondary | ICD-10-CM | POA: Insufficient documentation

## 2016-03-25 DIAGNOSIS — I4891 Unspecified atrial fibrillation: Secondary | ICD-10-CM | POA: Insufficient documentation

## 2016-03-25 DIAGNOSIS — R51 Headache: Secondary | ICD-10-CM

## 2016-03-25 DIAGNOSIS — E119 Type 2 diabetes mellitus without complications: Secondary | ICD-10-CM | POA: Insufficient documentation

## 2016-03-25 DIAGNOSIS — R519 Headache, unspecified: Secondary | ICD-10-CM

## 2016-03-25 DIAGNOSIS — Z79899 Other long term (current) drug therapy: Secondary | ICD-10-CM | POA: Diagnosis not present

## 2016-03-25 DIAGNOSIS — Z7984 Long term (current) use of oral hypoglycemic drugs: Secondary | ICD-10-CM | POA: Insufficient documentation

## 2016-03-25 LAB — URINALYSIS, ROUTINE W REFLEX MICROSCOPIC
Bilirubin Urine: NEGATIVE
Glucose, UA: 100 mg/dL — AB
Hgb urine dipstick: NEGATIVE
KETONES UR: NEGATIVE mg/dL
LEUKOCYTES UA: NEGATIVE
NITRITE: NEGATIVE
PH: 7 (ref 5.0–8.0)
Protein, ur: NEGATIVE mg/dL
Specific Gravity, Urine: 1.012 (ref 1.005–1.030)

## 2016-03-25 LAB — CBC WITH DIFFERENTIAL/PLATELET
BASOS ABS: 0 10*3/uL (ref 0.0–0.1)
BASOS PCT: 1 %
EOS ABS: 0.8 10*3/uL — AB (ref 0.0–0.7)
Eosinophils Relative: 10 %
HEMATOCRIT: 36.6 % — AB (ref 39.0–52.0)
HEMOGLOBIN: 12.6 g/dL — AB (ref 13.0–17.0)
Lymphocytes Relative: 35 %
Lymphs Abs: 2.9 10*3/uL (ref 0.7–4.0)
MCH: 30.1 pg (ref 26.0–34.0)
MCHC: 34.4 g/dL (ref 30.0–36.0)
MCV: 87.4 fL (ref 78.0–100.0)
MONOS PCT: 9 %
Monocytes Absolute: 0.7 10*3/uL (ref 0.1–1.0)
NEUTROS ABS: 3.9 10*3/uL (ref 1.7–7.7)
NEUTROS PCT: 45 %
PLATELETS: 209 10*3/uL (ref 150–400)
RBC: 4.19 MIL/uL — AB (ref 4.22–5.81)
RDW: 13 % (ref 11.5–15.5)
WBC: 8.4 10*3/uL (ref 4.0–10.5)

## 2016-03-25 LAB — COMPREHENSIVE METABOLIC PANEL
ALK PHOS: 48 U/L (ref 38–126)
ALT: 18 U/L (ref 17–63)
ANION GAP: 9 (ref 5–15)
AST: 16 U/L (ref 15–41)
Albumin: 3.6 g/dL (ref 3.5–5.0)
BUN: 19 mg/dL (ref 6–20)
CALCIUM: 9.3 mg/dL (ref 8.9–10.3)
CHLORIDE: 101 mmol/L (ref 101–111)
CO2: 26 mmol/L (ref 22–32)
Creatinine, Ser: 0.91 mg/dL (ref 0.61–1.24)
GFR calc non Af Amer: >60 mL/min (ref >60)
Glucose, Bld: 198 mg/dL — ABNORMAL HIGH (ref 65–99)
POTASSIUM: 4.3 mmol/L (ref 3.5–5.1)
SODIUM: 136 mmol/L (ref 135–145)
Total Bilirubin: 1 mg/dL (ref 0.3–1.2)
Total Protein: 7.2 g/dL (ref 6.5–8.1)

## 2016-03-25 LAB — SEDIMENTATION RATE: SED RATE: 15 mm/h (ref 0–20)

## 2016-03-25 LAB — VITAMIN B12: VITAMIN B 12: 149 pg/mL — AB (ref 211–911)

## 2016-03-25 LAB — TROPONIN I: Troponin I: <0.03 ng/mL (ref <0.03)

## 2016-03-25 MED ORDER — METFORMIN HCL ER 500 MG PO TB24: 500.0000 mg | ORAL_TABLET | Freq: Two times a day (BID) | ORAL | Status: DC

## 2016-03-25 MED ORDER — AMLODIPINE BESYLATE 5 MG PO TABS
5.0000 mg | ORAL_TABLET | Freq: Once | ORAL | Status: AC
  Administered 2016-03-25: 5 mg via ORAL
  Filled 2016-03-25: qty 1

## 2016-03-25 MED ORDER — KETOROLAC TROMETHAMINE 30 MG/ML IJ SOLN
30.0000 mg | Freq: Once | INTRAMUSCULAR | Status: AC
  Administered 2016-03-25: 30 mg via INTRAVENOUS
  Filled 2016-03-25: qty 1

## 2016-03-25 MED ORDER — SODIUM CHLORIDE 0.9 % IV BOLUS (SEPSIS)
500.0000 mL | Freq: Once | INTRAVENOUS | Status: AC
  Administered 2016-03-25: 500 mL via INTRAVENOUS

## 2016-03-25 NOTE — Patient Instructions (Addendum)
Gerald Holt likely has Alzheimer's dementia.  He also has cerebrovascular disease, which is contributing as well.  1.  Consider donepezil (Aricept), a medication used to help slow down the progression of dementia.  Side effects may include nausea, vomiting, diarrhea, vivid dreams, and muscle cramps.  Contact us if you wish to try this medication.  2.  Continue aspirin 81mg  daily 3.  Continue playing piano.  Play brain teasers and games 4.  Recommend using walker at all times 5.  Check B12 and Sed Rate 6.  Follow up in 6 months or as needed  Alzheimer Disease Caregiver Guide Alzheimer disease is an illness that affects a person's brain. It causes a person to lose the ability to remember things and make good decisions. As the disease progresses, the person is unable to take care of himself or herself and needs more and more help to do simple tasks. Taking care of someone with Alzheimer disease can be very challenging and overwhelming.  MEMORY LOSS AND CONFUSION Memory loss and confusion is mild in the beginning stages of the disease. Both of these problems become more severe as the disease progresses. Eventually, the person will not recognize places or even close family members and friends.   Stay calm.  Respond with a short explanation. Long explanations can be overwhelming and confusing.  Avoid corrections that sound like scolding.  Try not to take it personally, even if the person forgets your name. BEHAVIOR CHANGES Behavior changes are part of the disease. The person may develop depression, anxiety, anger, hallucinations, or other behavior changes. These changes can come on suddenly and may be in response to pain, infection, changes in the environment (temperature, noise), overstimulation, or feeling lost or scared.   Try not to take behavior changes personally.  Remain calm and patient.  Do not argue or try to convince the person about a specific point. This will only make him or her  more agitated.  Know that the behavior changes are part of the disease process and try to work through it. TIPS TO REDUCE FRUSTRATION  Schedule wisely by making appointments and doing daily tasks, like bathing and dressing, when the person is at his or her best.  Take your time. Simple tasks may take a lot longer, so be sure to allow for plenty of time.  Limit choices. Too many choices can be overwhelming and stressful for the person.  Involve the person in what you are doing.  Stick to a routine.  Avoid new or crowded situations, if possible.  Use simple words, short sentences, and a calm voice. Only give one direction at a time.  Buy clothes and shoes that are easy to put on and take off.  Let people help if they offer. HOME SAFETY Keeping the home safe is very important to reduce the risk of falls and injuries.   Keep floors clear of clutter. Remove rugs, magazine racks, and floor lamps.  Keep hallways well lit.  Put a handrail and nonslip mat in the bathtub or shower.  Put childproof locks on cabinets with dangerous items, such as medicine, alcohol, guns, toxic cleaning items, sharp tools or utensils, matches, or lighters.  Place locks on doors where the person cannot easily see or reach them. This helps ensure that the person cannot wander out of the house and get lost.  Be prepared for emergencies. Keep a list of emergency phone numbers and addresses in a convenient area. PLANS FOR THE FUTURE  Do not put  off talking about finances.  Talk about money management. People with Alzheimer disease have trouble managing their money as the disease gets worse.  Get help from professional advisors regarding financial and legal matters.  Do not put off talking about future care.  Choose a power of attorney. This is someone who can make decisions for the person with Alzheimer disease when he or she is no longer able to do so.  Talk about driving and when it is the right time  to stop. The person's health care provider can help give advice on this matter.  Talk about the person's living situation. If he or she lives alone, you need to make sure he or she is safe. Some people need extra help at home, and others need more care at a nursing home or care center. SUPPORT GROUPS Joining a support group can be very helpful for caregivers of people with Alzheimer disease. Some advantages to being part of a support group include:   Getting strategies to manage stress.  Sharing experiences with others.  Receiving emotional comfort and support.  Learning new caregiving skills as the disease progresses.  Knowing what community resources are available and taking advantage of them. SEEK MEDICAL CARE IF:  The person has a fever.  The person has a sudden change in behavior that does not improve with calming strategies.  The person is unable to manage in his or her current living situation.  The person threatens you or anyone else, including himself or herself.  You are no longer able to care for the person.   This information is not intended to replace advice given to you by your health care provider. Make sure you discuss any questions you have with your health care provider.   Document Released: 05/21/2004 Document Revised: 09/30/2014 Document Reviewed: 10/16/2011 Elsevier Interactive Patient Education [employer].

## 2016-03-25 NOTE — ED Notes (Signed)
MD at bedside. 

## 2016-03-25 NOTE — ED Notes (Signed)
Pt is poor historian, waiting on wife, pt reports ongoing HA for three days with no relief from medications.

## 2016-03-25 NOTE — Telephone Encounter (Signed)
Drue Dun-- I wasn't sure if you are still doing [facility]'s scheduling but this pt needs an ER follow up with [facility].  Thanks!

## 2016-03-25 NOTE — Telephone Encounter (Signed)
See previous 03/25/16 mychart message and Provider response.

## 2016-03-25 NOTE — ED Provider Notes (Signed)
CSN: [id number]     Arrival date & time 03/25/16  0331 History   First MD Initiated Contact with Patient 03/25/16 [phone]     Chief Complaint  Patient presents with  . Headache     (Consider location/radiation/quality/duration/timing/severity/associated sxs/prior Treatment) Patient is a 82 y.o. male presenting with headaches. The history is provided by the patient.  Headache Pain location:  L temporal and R temporal Quality:  Unable to specify Severity currently:  Unable to specify Severity at highest:  Unable to specify Onset quality:  Gradual Duration:  4 days Timing:  Constant Progression:  Unchanged Chronicity:  New Context: not activity and not stress   Context comment:  Following having his teeth cleaned Relieved by:  Nothing Worsened by:  Nothing Ineffective treatments: excedrinx1. Associated symptoms: no abdominal pain, no cough, no diarrhea, no dizziness, no neck pain, no neck stiffness, no seizures and no vomiting   Risk factors: no anger   Family reports patient felt warm following dental procedure and had a temp of 99.0 on Friday and wife reports to [place] and EDP that according to a daughter who is a nurse that elderly people do not get fevers and therefore he needed to be evaluated in the ED for infection.  Patient has known gait abnormalities since his previous CVA and recently scored 16/27 on a neurocognitive test.  Has an appointment this am with neurology to discuss the testing further.   Past Medical History  Diagnosis Date  . Stroke [facility]) Nov 2011  . Diabetes type 2, controlled ([place]) 2000  . History of chicken pox   . Whooping cough   . Mumps     Adult  . A-fib ([place])   . Cataracts, bilateral   . Retinopathy   . Undescended testicle, unilateral     Right   Past Surgical History  Procedure Laterality Date  . Tooth extraction    . Pre-malignant / benign skin lesion excision    . Testicle surgery      Right, undescended   Family History  Problem Relation  Age of Onset  . Pneumonia Mother 77    Deceased  . GI Bleed Father 27    Deceased - Ulcers  . Diabetes Cousin   . Diabetes Brother   . Cancer Paternal Aunt    Social History  Substance Use Topics  . Smoking status: Never Smoker   . Smokeless tobacco: None     Comment: Hasn't smoked since age 78  . Alcohol Use: No    Review of Systems  HENT: Positive for dental problem and mouth sores.   Respiratory: Negative for cough.   Cardiovascular: Negative for chest pain.  Gastrointestinal: Negative for vomiting, abdominal pain and diarrhea.  Genitourinary: Negative for dysuria.  Musculoskeletal: Negative for neck pain and neck stiffness.  Neurological: Positive for headaches. Negative for dizziness, seizures, facial asymmetry and speech difficulty.  All other systems reviewed and are negative.     Allergies  Shellfish allergy; Aspirin-dipyridamole er; and Other  Home Medications   Prior to Admission medications   Medication Sig Start Date End Date Taking? Authorizing Provider  aspirin 81 MG tablet Take 1 tablet (81 mg total) by mouth daily. 06/22/15  Yes Jerline Pain, MD  levothyroxine (SYNTHROID, LEVOTHROID) 50 MCG tablet TAKE 1 TABLET EVERY DAY 12/26/15  Yes [name], PA-C  lisinopril (PRINIVIL,ZESTRIL) 2.5 MG tablet Take 1 tablet (2.5 mg total) by mouth daily. 06/22/15  Yes Jerline Pain, MD  metFORMIN [facility])  500 MG 24 hr tablet Take 1 tablet (500 mg total) by mouth 2 (two) times daily with a meal. [2] in AM with B'fast 10/31/15  Yes [name], PA-C  metoprolol tartrate (LOPRESSOR) 25 MG tablet Take 0.5 tablets (12.5 mg total) by mouth 2 (two) times daily. 06/09/15  Yes [name], PA-C  Omega-3 Fatty Acids (SB OMEGA-3 FISH OIL) 1000 MG CAPS Take 1 capsule by mouth 2 (two) times daily. Reported on 10/06/2015   Yes Historical Provider, MD  OVER THE COUNTER MEDICATION Take 1 each by mouth 2 (two) times daily. Focus Select Supplement: Vitamin C & E, Zinc &  Copper   Yes Historical Provider, MD  Eyelid Cleansers (STERILID EX) Apply topically. Eye Wash: Once Daily    Historical Provider, MD   BP 192/87 mmHg  Pulse 60  Temp(Src) 97.8 F (36.6 C) (Oral)  Resp 20  Ht 5\' 7"  (1.702 m)  Wt 173 lb (78.472 kg)  BMI 27.09 kg/m2  SpO2 94% Physical Exam  Constitutional: He appears well-developed and well-nourished. No distress.  HENT:  Head: Normocephalic and atraumatic.  Right Ear: No mastoid tenderness. Tympanic membrane is not perforated and not erythematous. No middle ear effusion. No hemotympanum.  Left Ear: No mastoid tenderness. Tympanic membrane is not perforated and not erythematous.  No middle ear effusion. No hemotympanum.  Mouth/Throat: Oropharynx is clear and moist. No oral lesions. No trismus in the jaw. No dental abscesses, uvula swelling or lacerations. No posterior oropharyngeal edema.  No oral lesions, no swelling of the lips tongue of uvula  Eyes: Conjunctivae and EOM are normal. Pupils are equal, round, and reactive to light.  Neck: Normal range of motion. Neck supple. No JVD present.  Cardiovascular: Normal rate, regular rhythm and intact distal pulses.   Pulmonary/Chest: Effort normal and breath sounds normal. No stridor. No respiratory distress. He has no wheezes. He has no rales.  Abdominal: Soft. Bowel sounds are normal. There is no tenderness. There is no rebound and no guarding.  Musculoskeletal: Normal range of motion. He exhibits no edema.  Lymphadenopathy:    He has no cervical adenopathy.  Neurological: He is alert. He has normal reflexes. He displays normal reflexes.  Skin: Skin is warm and dry.  Psychiatric: He has a normal mood and affect.  Vitals reviewed.   ED Course  Procedures (including critical care time) Labs Review Labs Reviewed  CBC WITH DIFFERENTIAL/PLATELET - Abnormal; Notable for the following:    RBC 4.19 (*)    Hemoglobin 12.6 (*)    HCT 36.6 (*)    Eosinophils Absolute 0.8 (*)    All other  components within normal limits  COMPREHENSIVE METABOLIC PANEL - Abnormal; Notable for the following:    Glucose, Bld 198 (*)    All other components within normal limits  TROPONIN I  URINALYSIS, ROUTINE W REFLEX MICROSCOPIC (NOT AT [facility])    Imaging Review Ct Head Wo Contrast  03/25/2016  CLINICAL DATA:  Acute onset of fever and altered mental status. Initial encounter. EXAM: CT HEAD WITHOUT CONTRAST TECHNIQUE: Contiguous axial images were obtained from the base of the skull through the vertex without intravenous contrast. COMPARISON:  None. FINDINGS: There is no evidence of acute infarction, mass lesion, or intra- or extra-axial hemorrhage on CT. The prominence of ventricles and sulci reflects moderately severe cortical volume loss. Cerebellar atrophy is noted. Scattered periventricular and subcortical white matter change likely reflects small vessel ischemic microangiopathy. A small chronic infarct is noted at the right  convexity. Small chronic lacunar infarcts are seen at the basal ganglia bilaterally, and at the cerebellar hemispheres. The brainstem and fourth ventricle are within normal limits. The cerebral hemispheres demonstrate grossly normal gray-white differentiation. No mass effect or midline shift is seen. There is no evidence of fracture; visualized osseous structures are unremarkable in appearance. The orbits are within normal limits. Mucoperiosteal thickening is noted at the right maxillary sinus. The remaining paranasal sinuses and mastoid air cells are well-aerated. No significant soft tissue abnormalities are seen. IMPRESSION: 1. No acute intracranial pathology seen on CT. 2. Moderately severe cortical volume loss and scattered small vessel ischemic microangiopathy. 3. Small chronic infarct at the right convexity, and small chronic lacunar infarcts at the basal ganglia bilaterally, and at the cerebellar hemispheres. 4. Mucoperiosteal thickening at the right maxillary sinus. Electronically  Signed   By: Garald Balding M.D.   On: 03/25/2016 05:00   I have personally reviewed and evaluated these images and lab results as part of my medical decision-making.   EKG Interpretation   Date/Time:  Monday March 25 2016 03:55:18 EDT Ventricular Rate:  62 PR Interval:    QRS Duration: 141 QT Interval:  477 QTC Calculation: 485 R Axis:   48 Text Interpretation:  Sinus rhythm Right bundle branch block Confirmed by  [facility]  MD, Jewel Mcafee ([id number]) on 03/25/2016 4:20:34 AM      MDM   Final diagnoses:  None    Filed Vitals:   03/25/16 0431 03/25/16 0611  BP: 192/87 144/88  Pulse:  67  Temp:    Resp:  18    Results for orders placed or performed during the hospital encounter of 03/25/16  CBC with Differential  Result Value Ref Range   WBC 8.4 4.0 - 10.5 K/uL   RBC 4.19 (L) 4.22 - 5.81 MIL/uL   Hemoglobin 12.6 (L) 13.0 - 17.0 g/dL   HCT 36.6 (L) 39.0 - 52.0 %   MCV 87.4 78.0 - 100.0 fL   MCH 30.1 26.0 - 34.0 pg   MCHC 34.4 30.0 - 36.0 g/dL   RDW 13.0 11.5 - 15.5 %   Platelets 209 150 - 400 K/uL   Neutrophils Relative % 45 %   Neutro Abs 3.9 1.7 - 7.7 K/uL   Lymphocytes Relative 35 %   Lymphs Abs 2.9 0.7 - 4.0 K/uL   Monocytes Relative 9 %   Monocytes Absolute 0.7 0.1 - 1.0 K/uL   Eosinophils Relative 10 %   Eosinophils Absolute 0.8 (H) 0.0 - 0.7 K/uL   Basophils Relative 1 %   Basophils Absolute 0.0 0.0 - 0.1 K/uL  Comprehensive metabolic panel  Result Value Ref Range   Sodium 136 135 - 145 mmol/L   Potassium 4.3 3.5 - 5.1 mmol/L   Chloride 101 101 - 111 mmol/L   CO2 26 22 - 32 mmol/L   Glucose, Bld 198 (H) 65 - 99 mg/dL   BUN 19 6 - 20 mg/dL   Creatinine, Ser 0.91 0.61 - 1.24 mg/dL   Calcium 9.3 8.9 - 10.3 mg/dL   Total Protein 7.2 6.5 - 8.1 g/dL   Albumin 3.6 3.5 - 5.0 g/dL   AST 16 15 - 41 U/L   ALT 18 17 - 63 U/L   Alkaline Phosphatase 48 38 - 126 U/L   Total Bilirubin 1.0 0.3 - 1.2 mg/dL   GFR calc non Af Amer >60 >60 mL/min   GFR calc Af Amer >60  >60 mL/min   Anion gap 9 5 -  15  Troponin I  Result Value Ref Range   Troponin I <0.03 <0.03 ng/mL   Ct Head Wo Contrast  03/25/2016  CLINICAL DATA:  Acute onset of fever and altered mental status. Initial encounter. EXAM: CT HEAD WITHOUT CONTRAST TECHNIQUE: Contiguous axial images were obtained from the base of the skull through the vertex without intravenous contrast. COMPARISON:  None. FINDINGS: There is no evidence of acute infarction, mass lesion, or intra- or extra-axial hemorrhage on CT. The prominence of ventricles and sulci reflects moderately severe cortical volume loss. Cerebellar atrophy is noted. Scattered periventricular and subcortical white matter change likely reflects small vessel ischemic microangiopathy. A small chronic infarct is noted at the right convexity. Small chronic lacunar infarcts are seen at the basal ganglia bilaterally, and at the cerebellar hemispheres. The brainstem and fourth ventricle are within normal limits. The cerebral hemispheres demonstrate grossly normal gray-white differentiation. No mass effect or midline shift is seen. There is no evidence of fracture; visualized osseous structures are unremarkable in appearance. The orbits are within normal limits. Mucoperiosteal thickening is noted at the right maxillary sinus. The remaining paranasal sinuses and mastoid air cells are well-aerated. No significant soft tissue abnormalities are seen. IMPRESSION: 1. No acute intracranial pathology seen on CT. 2. Moderately severe cortical volume loss and scattered small vessel ischemic microangiopathy. 3. Small chronic infarct at the right convexity, and small chronic lacunar infarcts at the basal ganglia bilaterally, and at the cerebellar hemispheres. 4. Mucoperiosteal thickening at the right maxillary sinus. Electronically Signed   By: Garald Balding M.D.   On: 03/25/2016 05:00    Medications  sodium chloride 0.9 % bolus 500 mL (0 mLs Intravenous Stopped 03/25/16 0534)   amLODipine (NORVASC) tablet 5 mg (5 mg Oral Given 03/25/16 0431)  ketorolac (TORADOL) 30 MG/ML injection 30 mg (30 mg Intravenous Given 03/25/16 0534)  I assured the patient's wife with Nicki Reaper present that elderly people can mount a fever and that we will do a thorough evaluation to exclude lifethreatening causes of headache and mouth pain  Have updated the patient's wife multiple times on return of labs and imaging, labs are normal and Ct shows old infarct which is known.  Given the time course of 3 days, we would have seen a new infarct in evolution on CT, but there are only chronic changes.  There are not signs of systemic infection and patient has no fever in the ED.  He is well appearing.  This is reassuring.  I explained that we have excluded life threatening conditions.  She is concerned that the patient is in renal failure.  I have explained that based on lab values in the ED he is not in renal failure and has a good GFR.    Patient's wife would like to know why he is not sleeping 12 hours a night and what she needs to give him to make him do so.  She would also like to know why his has mouth pain post cleaning.  She would like to know if she should still keep her appointment with neurology this am.  I politely explained that these are all great questions but as an emergency physician I am not a specialist in sleep medicine and this a very good question for the patient's family physician and perhaps neurology, as this may be a part of the condition that they are going to be evaluating the patient for and thus is imperative that she keeps this am's appointment as many of her  concerns with behavior may be related to same.  As for the mouth pain, the patient has not complained of this, as a matter of fact he has been resting comfortably the entire visit but in answer to her question, it may be they had to scrape tartar and may and this can leave residual pain but I see no reason on oral or HEENT exam for the  symptoms and perhaps it would be wise to discuss this with the dentist who did the cleaning.    She then informed the EDP that I needed to  catheterize the patient as he will urinate on own.  She would also like all his pain to be resolved.    Patient's urine is well hydrated with a specific gravity of 1.012, in also shows no signs of infection.  Moreover there are no ketones to suggest starvation.   Patient is now pain free post medication  We have discussed starting saw palmetto and following up with Dr. Hassell Done for PSA testing and discussion of prostate medication to keep patient's urine flowing  Wife expresses gratitude that all questions were answered All life threatening conditions that would require immediate hospitalization have been ruled out and the patient is stable for discharge at this time.  I suspect many of the patient's wife's concerns are related to dementia and it's sequelae.  She is instructed to keep the appointment with neurology.      Veatrice Kells, MD 03/25/16 [phone]

## 2016-03-25 NOTE — Telephone Encounter (Signed)
Please contact pt's wife to arrange and  ED follow up with Elyn Aquas.

## 2016-03-25 NOTE — Progress Notes (Addendum)
NEUROLOGY CONSULTATION NOTE  Gerald Holt MRN: [id number] DOB: 26-Oct-1923  Referring provider: Leeanne Rio, PA-C Primary care provider: Leeanne Rio, PA-C  Reason for consult:  dementia  HISTORY OF PRESENT ILLNESS: Gerald Holt is a 82 year old man with type 2 diabetes, a-fib, and history of stroke who presents for evaluation of dementia.  History obtained by patient and supplemented by his wife.  ED note also reviewed.  Since February, he has had a progressive decline in cognition.  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  He plays piano daily.  He started having trouble remembering which pieces he played and sometimes how to play them.  He also has left the stove on and water running.  He started having trouble getting dress and has put on his shirt incorrectly.  He stopped driving in April.  He has had 8 falls over the past couple of years.  His last fall was about 6 weeks ago, in which he hit his head and sustained a concussion.  He has been receiving PT.  He uses a cane but it was recommended to use a walker.  He has not had any hallucinations or difficulty recognizing friends and family.  He started having trouble sleeping over the past couple of weeks.  He naps during the day.  At night, he will lay in bed but he does not get agitated or confused.  Sometimes he wakes up confused.  Over the past month, he has been incontinent to urine.  He is hard of hearing and has a audiology test in 2 weeks.  Last Wednesday, he went to the dentist and had a cleaning.  Afterwards, he developed pain in his mouth.  He started having a bitemporal headache.  He was also warm and diaphoretic.  His temperature at home was reportedly 99 F.  He was seen in the ED last night.  He had a dental procedure and was found to have a temperature of 99.  He had headache related to the dental procedure as well.  In the ED, temperature was 97.8 F.  Blood pressure was 208/97, later 144/88.  CT of  head showed showed moderately severe cortical atrophy and small vessel ischemic changes with small chronic lacunar infarcts in the basal ganglia bilaterally and cerebellar hemispheres.  CBC and UA showed no infection.  He was found to have an extended bladder, requiring catheterization.  He was treated for the headache, which has improved.  He denies new visual disturbance.  There is no family history of dementia, but his family passed away at young age.  LDL from  07/04/15 was 45. TSH from 10/06/15 was 3.84.   Hgb A1c from 01/02/16 was 6.2.  BMP was normal except for glucose of 166.   PAST MEDICAL HISTORY: Past Medical History  Diagnosis Date  . Stroke [facility]) Nov 2011  . Diabetes type 2, controlled ([place]) 2000  . History of chicken pox   . Whooping cough   . Mumps     Adult  . A-fib ([place])   . Cataracts, bilateral   . Retinopathy   . Undescended testicle, unilateral     Right    PAST SURGICAL HISTORY: Past Surgical History  Procedure Laterality Date  . Tooth extraction    . Pre-malignant / benign skin lesion excision    . Testicle surgery      Right, undescended    MEDICATIONS: Current Outpatient Prescriptions on File Prior to Visit  Medication Sig Dispense  Refill  . aspirin 81 MG tablet Take 1 tablet (81 mg total) by mouth daily.    . Eyelid Cleansers (STERILID EX) Apply topically. Eye Wash: Once Daily    . levothyroxine (SYNTHROID, LEVOTHROID) 50 MCG tablet TAKE 1 TABLET EVERY DAY 90 tablet 1  . lisinopril (PRINIVIL,ZESTRIL) 2.5 MG tablet Take 1 tablet (2.5 mg total) by mouth daily. 90 tablet 3  . metFORMIN (GLUCOPHAGE-XR) 500 MG 24 hr tablet Take 1 tablet (500 mg total) by mouth 2 (two) times daily with a meal. [2] in AM with B'fast 180 tablet 0  . metoprolol tartrate (LOPRESSOR) 25 MG tablet Take 0.5 tablets (12.5 mg total) by mouth 2 (two) times daily. 180 tablet 1  . Omega-3 Fatty Acids (SB OMEGA-3 FISH OIL) 1000 MG CAPS Take 1 capsule by mouth 2 (two) times daily.  Reported on 10/06/2015    . OVER THE COUNTER MEDICATION Take 1 each by mouth 2 (two) times daily. Focus Select Supplement: Vitamin C & E, Zinc & Copper     No current facility-administered medications on file prior to visit.    ALLERGIES: Allergies  Allergen Reactions  . Shellfish Allergy Anaphylaxis  . Aspirin-Dipyridamole Er     Unknown per patient  . Other Other (See Comments)    Blood Thinner given in Rehab gave H/As - Not Coumadin     FAMILY HISTORY: Family History  Problem Relation Age of Onset  . Pneumonia Mother 29    Deceased  . GI Bleed Father 72    Deceased - Ulcers  . Diabetes Cousin   . Diabetes Brother   . Cancer Paternal Aunt     SOCIAL HISTORY: Social History   Social History  . Marital Status: Married    Spouse Name: N/A  . Number of Children: N/A  . Years of Education: N/A   Occupational History  . Not on file.   Social History Main Topics  . Smoking status: Never Smoker   . Smokeless tobacco: Never Used     Comment: Hasn't smoked since age 30  . Alcohol Use: No  . Drug Use: No  . Sexual Activity: Not on file   Other Topics Concern  . Not on file   Social History Narrative   Lives with wife in a one story home.  Has 1 child.  Retired Armed forces technical officer.  Education: Oceanographer.      REVIEW OF SYSTEMS: Constitutional: No fevers, chills, or sweats, no generalized fatigue, change in appetite Eyes: No visual changes, double vision, eye pain Ear, nose and throat: No hearing loss, ear pain, nasal congestion, sore throat Cardiovascular: No chest pain, palpitations Respiratory:  No shortness of breath at rest or with exertion, wheezes GastrointestinaI: No nausea, vomiting, diarrhea, abdominal pain, fecal incontinence Genitourinary:  No dysuria, urinary retention or frequency Musculoskeletal:  No neck pain, back pain Integumentary: No rash, pruritus, skin lesions Neurological: as above Psychiatric: No depression, insomnia, anxiety Endocrine: No  palpitations, fatigue, diaphoresis, mood swings, change in appetite, change in weight, increased thirst Hematologic/Lymphatic:  No purpura, petechiae. Allergic/Immunologic: no itchy/runny eyes, nasal congestion, recent allergic reactions, rashes  PHYSICAL EXAM: Filed Vitals:   03/25/16 1445  BP: 120/74  Pulse: 96   General: No acute distress.  Patient appears well-groomed.  Head:  Normocephalic/atraumatic Eyes:  fundi examined but not visualized Neck: supple, no paraspinal tenderness, full range of motion Back: No paraspinal tenderness Heart: regular rate and rhythm Lungs: Clear to auscultation bilaterally. Vascular: No carotid bruits. Neurological Exam: Mental status:  alert and oriented to person, state, and year (not month or date), recent memory poor, remote memory intact, fund of knowledge intact, attention and concentration fair, speech fluent and not dysarthric, language intact. MMSE - Mini Mental State Exam 03/25/2016  Orientation to time 2  Orientation to Place 2  Registration 3  Attention/ Calculation 0  Recall 3  Language- name 2 objects 2  Language- repeat 1  Language- follow 3 step command 3  Language- read & follow direction 1  Write a sentence 0  Copy design 0  Total score 17   Cranial nerves: CN I: not tested CN II: pupils equal, round and reactive to light, visual fields intact CN III, IV, VI:  full range of motion, no nystagmus, no ptosis CN V: facial sensation intact CN VII: upper and lower face symmetric CN VIII: hearing intact CN IX, X: gag intact, uvula midline CN XI: sternocleidomastoid and trapezius muscles intact CN XII: tongue midline Bulk & Tone: normal, no fasciculations. Motor:  5/5 throughout  Sensation: temperature and vibration sensation intact. Deep Tendon Reflexes:  2+ throughout, toes downgoing.  Finger to nose testing:  Without dysmetria.  Gait:  Slow, cautious gait, unsteady, requiring assistance.  Romberg not tested as he is  unsteady.  IMPRESSION: 1.  Probable Alzheimer's dementia, or mixed vascular-Alzheimer's dementia 2.  New onset headache.  Likely tension-type, related to pain and discomfort in his mouth.  Given his age and mildly elevated temperature, will check for temporal arteritis.  PLAN: 1.  Discussed Aricept, risks and benefits.  His wife will think about it and get back to [name]. 2.  Continue ASA 81mg  daily for secondary stroke prevention (agree that he should not be on anticoagulation due to fall risk) 3. Discussed mind stimulation 5.  Check B12 6.  Check sed rate given the headache 7.  For sleep hygiene, will try melatonin 1 to 3 mg at bedtime 8.  Use walker for ambulation 9. Follow up in 6 months.  Thank you for allowing me to take part in the care of this patient.  Metta Clines, DO  CC:  Leeanne Rio, PA-C

## 2016-03-25 NOTE — ED Notes (Signed)
Per pt wife report on Wednesday had teeth cleaned on then thrusday felt sore moths, then Friday felt like he had a fever and has not been acting right since then, planned DR visit with Dr in the AM to discuss possible dementia.

## 2016-03-25 NOTE — Discharge Instructions (Signed)

## 2016-03-26 DIAGNOSIS — F0390 Unspecified dementia without behavioral disturbance: Secondary | ICD-10-CM | POA: Diagnosis not present

## 2016-03-26 DIAGNOSIS — R296 Repeated falls: Secondary | ICD-10-CM | POA: Diagnosis not present

## 2016-03-26 DIAGNOSIS — I10 Essential (primary) hypertension: Secondary | ICD-10-CM | POA: Diagnosis not present

## 2016-03-26 DIAGNOSIS — K219 Gastro-esophageal reflux disease without esophagitis: Secondary | ICD-10-CM | POA: Diagnosis not present

## 2016-03-26 DIAGNOSIS — E1165 Type 2 diabetes mellitus with hyperglycemia: Secondary | ICD-10-CM | POA: Diagnosis not present

## 2016-03-26 DIAGNOSIS — I480 Paroxysmal atrial fibrillation: Secondary | ICD-10-CM | POA: Diagnosis not present

## 2016-03-27 ENCOUNTER — Telehealth: Payer: Self-pay

## 2016-03-27 ENCOUNTER — Emergency Department (HOSPITAL_BASED_OUTPATIENT_CLINIC_OR_DEPARTMENT_OTHER): Payer: Medicare Other

## 2016-03-27 ENCOUNTER — Other Ambulatory Visit: Payer: Self-pay | Admitting: Physician Assistant

## 2016-03-27 ENCOUNTER — Emergency Department (HOSPITAL_BASED_OUTPATIENT_CLINIC_OR_DEPARTMENT_OTHER)
Admission: EM | Admit: 2016-03-27 | Discharge: 2016-03-27 | Disposition: A | Discharge status: Home / Self Care | Payer: Medicare Other | Attending: Emergency Medicine | Admitting: Emergency Medicine

## 2016-03-27 ENCOUNTER — Encounter (HOSPITAL_BASED_OUTPATIENT_CLINIC_OR_DEPARTMENT_OTHER): Payer: Self-pay | Admitting: Emergency Medicine

## 2016-03-27 DIAGNOSIS — N390 Urinary tract infection, site not specified: Secondary | ICD-10-CM | POA: Insufficient documentation

## 2016-03-27 DIAGNOSIS — I4891 Unspecified atrial fibrillation: Secondary | ICD-10-CM | POA: Insufficient documentation

## 2016-03-27 DIAGNOSIS — E119 Type 2 diabetes mellitus without complications: Secondary | ICD-10-CM | POA: Insufficient documentation

## 2016-03-27 DIAGNOSIS — M25552 Pain in left hip: Secondary | ICD-10-CM | POA: Diagnosis not present

## 2016-03-27 DIAGNOSIS — W1839XA Other fall on same level, initial encounter: Secondary | ICD-10-CM | POA: Diagnosis not present

## 2016-03-27 DIAGNOSIS — Z79899 Other long term (current) drug therapy: Secondary | ICD-10-CM | POA: Diagnosis not present

## 2016-03-27 DIAGNOSIS — Z87891 Personal history of nicotine dependence: Secondary | ICD-10-CM | POA: Insufficient documentation

## 2016-03-27 DIAGNOSIS — Z7984 Long term (current) use of oral hypoglycemic drugs: Secondary | ICD-10-CM | POA: Insufficient documentation

## 2016-03-27 DIAGNOSIS — S0990XA Unspecified injury of head, initial encounter: Secondary | ICD-10-CM | POA: Diagnosis not present

## 2016-03-27 DIAGNOSIS — Z7982 Long term (current) use of aspirin: Secondary | ICD-10-CM | POA: Diagnosis not present

## 2016-03-27 DIAGNOSIS — S79912A Unspecified injury of left hip, initial encounter: Secondary | ICD-10-CM | POA: Diagnosis not present

## 2016-03-27 DIAGNOSIS — Y939 Activity, unspecified: Secondary | ICD-10-CM | POA: Insufficient documentation

## 2016-03-27 DIAGNOSIS — R2689 Other abnormalities of gait and mobility: Secondary | ICD-10-CM | POA: Diagnosis not present

## 2016-03-27 DIAGNOSIS — R262 Difficulty in walking, not elsewhere classified: Secondary | ICD-10-CM | POA: Diagnosis not present

## 2016-03-27 DIAGNOSIS — S299XXA Unspecified injury of thorax, initial encounter: Secondary | ICD-10-CM | POA: Diagnosis not present

## 2016-03-27 DIAGNOSIS — Y999 Unspecified external cause status: Secondary | ICD-10-CM | POA: Insufficient documentation

## 2016-03-27 DIAGNOSIS — W19XXXA Unspecified fall, initial encounter: Secondary | ICD-10-CM

## 2016-03-27 DIAGNOSIS — Y92002 Bathroom of unspecified non-institutional (private) residence single-family (private) house as the place of occurrence of the external cause: Secondary | ICD-10-CM | POA: Diagnosis not present

## 2016-03-27 DIAGNOSIS — R011 Cardiac murmur, unspecified: Secondary | ICD-10-CM | POA: Insufficient documentation

## 2016-03-27 DIAGNOSIS — Z8673 Personal history of transient ischemic attack (TIA), and cerebral infarction without residual deficits: Secondary | ICD-10-CM | POA: Diagnosis not present

## 2016-03-27 DIAGNOSIS — R0789 Other chest pain: Secondary | ICD-10-CM | POA: Diagnosis not present

## 2016-03-27 LAB — URINE MICROSCOPIC-ADD ON

## 2016-03-27 LAB — URINALYSIS, ROUTINE W REFLEX MICROSCOPIC
BILIRUBIN URINE: NEGATIVE
Glucose, UA: 500 mg/dL — AB
KETONES UR: NEGATIVE mg/dL
NITRITE: NEGATIVE
PH: 6 (ref 5.0–8.0)
PROTEIN: NEGATIVE mg/dL
Specific Gravity, Urine: 1.015 (ref 1.005–1.030)

## 2016-03-27 LAB — COMPREHENSIVE METABOLIC PANEL
ALK PHOS: 54 U/L (ref 38–126)
ALT: 19 U/L (ref 17–63)
AST: 19 U/L (ref 15–41)
Albumin: 3.5 g/dL (ref 3.5–5.0)
Anion gap: 10 (ref 5–15)
BILIRUBIN TOTAL: 0.6 mg/dL (ref 0.3–1.2)
BUN: 19 mg/dL (ref 6–20)
CALCIUM: 9.3 mg/dL (ref 8.9–10.3)
CO2: 25 mmol/L (ref 22–32)
CREATININE: 1.04 mg/dL (ref 0.61–1.24)
Chloride: 103 mmol/L (ref 101–111)
Glucose, Bld: 167 mg/dL — ABNORMAL HIGH (ref 65–99)
Potassium: 4.8 mmol/L (ref 3.5–5.1)
Sodium: 138 mmol/L (ref 135–145)
TOTAL PROTEIN: 6.8 g/dL (ref 6.5–8.1)

## 2016-03-27 LAB — CBC WITH DIFFERENTIAL/PLATELET
Basophils Absolute: 0.1 10*3/uL (ref 0.0–0.1)
Basophils Relative: 0 %
Eosinophils Absolute: 0.3 10*3/uL (ref 0.0–0.7)
Eosinophils Relative: 3 %
HCT: 35.2 % — ABNORMAL LOW (ref 39.0–52.0)
HEMOGLOBIN: 12.1 g/dL — AB (ref 13.0–17.0)
LYMPHS ABS: 2.4 10*3/uL (ref 0.7–4.0)
LYMPHS PCT: 21 %
MCH: 30.2 pg (ref 26.0–34.0)
MCHC: 34.4 g/dL (ref 30.0–36.0)
MCV: 87.8 fL (ref 78.0–100.0)
MONOS PCT: 8 %
Monocytes Absolute: 1 10*3/uL (ref 0.1–1.0)
NEUTROS PCT: 68 %
Neutro Abs: 8 10*3/uL — ABNORMAL HIGH (ref 1.7–7.7)
Platelets: 240 10*3/uL (ref 150–400)
RBC: 4.01 MIL/uL — AB (ref 4.22–5.81)
RDW: 12.9 % (ref 11.5–15.5)
WBC: 11.8 10*3/uL — AB (ref 4.0–10.5)

## 2016-03-27 LAB — PROTIME-INR
INR: 0.95 (ref 0.00–1.49)
Prothrombin Time: 12.9 seconds (ref 11.6–15.2)

## 2016-03-27 LAB — CBG MONITORING, ED: Glucose-Capillary: 159 mg/dL — ABNORMAL HIGH (ref 65–99)

## 2016-03-27 LAB — TROPONIN I: Troponin I: <0.03 ng/mL (ref <0.03)

## 2016-03-27 MED ORDER — CEPHALEXIN 500 MG PO CAPS: 500.0000 mg | ORAL_CAPSULE | Freq: Four times a day (QID) | ORAL | Status: DC

## 2016-03-27 MED ORDER — METFORMIN HCL ER 500 MG PO TB24: 1000.0000 mg | ORAL_TABLET | Freq: Every day | ORAL | Status: DC

## 2016-03-27 MED ORDER — CEPHALEXIN 250 MG PO CAPS
500.0000 mg | ORAL_CAPSULE | Freq: Once | ORAL | Status: AC
  Administered 2016-03-27: 500 mg via ORAL
  Filled 2016-03-27: qty 2

## 2016-03-27 NOTE — ED Provider Notes (Signed)
CSN: [id number]     Arrival date & time 03/27/16  1926 History  By signing my name below, I, Gerald Holt, attest that this documentation has been prepared under the direction and in the presence of Gerald Pence, MD. Electronically Signed: Soijett Holt, ED Scribe. 03/27/2016. 7:50 PM.   Chief Complaint  Patient presents with  . Fall      The history is provided by the patient and the spouse. No language interpreter was used.    Gerald Holt is a 82 y.o. male with a medical hx of stroke, DM, who presents to the Emergency Department via EMS complaining of fall onset this morning. He notes that he fell while attempting to get dressed. Wife notes that she just hired a CNA for the pt, but the pt refused to have the CNA aid him with getting dressed. Wife states that the pt struck his head multiple times on a closet shelf while attempting to get up. Wife notes that she called EMS to aid in getting the pt off the floor. Wife reports that the pt left leg was rotated out following the fall. He notes that he has not tried any medications for the relief of his symptoms. He denies LOC, HA, color change, rash, wound, and any other symptoms.    Past Medical History  Diagnosis Date  . Stroke [facility]) Nov 2011  . Diabetes type 2, controlled ([place]) 2000  . History of chicken pox   . Whooping cough   . Mumps     Adult  . A-fib ([place])   . Cataracts, bilateral   . Retinopathy   . Undescended testicle, unilateral     Right   Past Surgical History  Procedure Laterality Date  . Tooth extraction    . Pre-malignant / benign skin lesion excision    . Testicle surgery      Right, undescended   Family History  Problem Relation Age of Onset  . Pneumonia Mother 39    Deceased  . GI Bleed Father 45    Deceased - Ulcers  . Diabetes Cousin   . Diabetes Brother   . Cancer Paternal Aunt    Social History  Substance Use Topics  . Smoking status: Former Research scientist (life sciences)  . Smokeless tobacco: Never Used     Comment:  Hasn't smoked since age 38  . Alcohol Use: No    Review of Systems  Constitutional: Negative for fever and chills.  Skin: Negative for color change, rash and wound.  Neurological: Negative for syncope and headaches.      Allergies  Shellfish allergy; Aspirin-dipyridamole er; and Other  Home Medications   Prior to Admission medications   Medication Sig Start Date End Date Taking? Authorizing Provider  aspirin (GOODSENSE ASPIRIN) 325 MG tablet Take by mouth. 04/13/15 04/12/16  Historical Provider, MD  aspirin 81 MG tablet Take 1 tablet (81 mg total) by mouth daily. 06/22/15   Jerline Pain, MD  cephALEXin (KEFLEX) 500 MG capsule Take 1 capsule (500 mg total) by mouth 4 (four) times daily. 03/27/16   Gerald Pence, MD  Eyelid Cleansers (STERILID EX) Apply topically. Eye Wash: Once Daily    Historical Provider, MD  levothyroxine (SYNTHROID, LEVOTHROID) 50 MCG tablet TAKE 1 TABLET EVERY DAY 12/26/15   [name], PA-C  lisinopril (PRINIVIL,ZESTRIL) 2.5 MG tablet Take 1 tablet (2.5 mg total) by mouth daily. 06/22/15   Jerline Pain, MD  lovastatin (MEVACOR) 10 MG tablet  01/29/16   Historical Provider, MD  metFORMIN (GLUCOPHAGE-XR) 500 MG 24 hr tablet Take 2 tablets (1,000 mg total) by mouth daily with breakfast. 03/27/16   [name], PA-C  metoprolol tartrate (LOPRESSOR) 25 MG tablet Take 0.5 tablets (12.5 mg total) by mouth 2 (two) times daily. 06/09/15   [name], PA-C  Omega-3 Fatty Acids (SB OMEGA-3 FISH OIL) 1000 MG CAPS Take 1 capsule by mouth 2 (two) times daily. Reported on 10/06/2015    Historical Provider, MD  OVER THE COUNTER MEDICATION Take 1 each by mouth 2 (two) times daily. Focus Select Supplement: Vitamin C & E, Zinc & Copper    Historical Provider, MD  triamcinolone cream (KENALOG) 0.1 %  02/01/16   Historical Provider, MD   BP 152/80 mmHg  Pulse 69  Temp(Src) 98.4 F (36.9 C) (Oral)  Resp 20  Ht 5\' 7"  (1.702 m)  Wt 190 lb (86.183 kg)  BMI 29.75 kg/m2  SpO2  97% Physical Exam  Constitutional: He appears well-developed and well-nourished. No distress.  HENT:  Head: Normocephalic and atraumatic.  Eyes: EOM are normal.  Neck: Neck supple.  Cardiovascular: Normal rate and regular rhythm.  Exam reveals no gallop and no friction rub.   Murmur heard.  Systolic murmur is present with a grade of 2/6  Pulmonary/Chest: Effort normal and breath sounds normal. No respiratory distress. He has no wheezes. He has no rales.  Abdominal: Soft. He exhibits no distension. There is no tenderness.  Musculoskeletal: Normal range of motion.  Neurological: He is alert.  Oriented to name  Skin: Skin is warm and dry.  Psychiatric: He has a normal mood and affect. His behavior is normal.  Nursing note and vitals reviewed.   ED Course  Procedures (including critical care time) DIAGNOSTIC STUDIES: Oxygen Saturation is 96% on RA, nl by my interpretation.    COORDINATION OF CARE: 7:48 PM Discussed treatment plan with pt at bedside which includes CT head, labs, UA, and pt agreed to plan.    Labs Review Labs Reviewed  CBC WITH DIFFERENTIAL/PLATELET - Abnormal; Notable for the following:    WBC 11.8 (*)    RBC 4.01 (*)    Hemoglobin 12.1 (*)    HCT 35.2 (*)    Neutro Abs 8.0 (*)    All other components within normal limits  COMPREHENSIVE METABOLIC PANEL - Abnormal; Notable for the following:    Glucose, Bld 167 (*)    All other components within normal limits  URINALYSIS, ROUTINE W REFLEX MICROSCOPIC (NOT AT [facility]) - Abnormal; Notable for the following:    APPearance TURBID (*)    Glucose, UA 500 (*)    Hgb urine dipstick MODERATE (*)    Leukocytes, UA LARGE (*)    All other components within normal limits  URINE MICROSCOPIC-ADD ON - Abnormal; Notable for the following:    Squamous Epithelial / LPF 0-5 (*)    Bacteria, UA MANY (*)    All other components within normal limits  CBG MONITORING, ED - Abnormal; Notable for the following:    Glucose-Capillary  159 (*)    All other components within normal limits  URINE CULTURE  PROTIME-INR  TROPONIN I    Imaging Review Dg Chest 2 View  03/27/2016  CLINICAL DATA:  Status post fall, with generalized chest discomfort. Initial encounter. EXAM: CHEST  2 VIEW COMPARISON:  None. FINDINGS: The lungs are well-aerated. Vascular congestion is noted. Mildly increased interstitial markings may reflect transient interstitial edema. There is no evidence of pleural effusion or pneumothorax.  The heart is borderline normal in size. Calcification is noted overlying the ventricles. No acute osseous abnormalities are seen. IMPRESSION: Vascular congestion noted. Mildly increased interstitial markings may reflect transient interstitial edema. No displaced rib fracture seen. Electronically Signed   By: Garald Balding M.D.   On: 03/27/2016 22:08   Ct Head Wo Contrast  03/27/2016  CLINICAL DATA:  Pt using restroom Pt fell today while getting dressed has NT who found him on floor EMS called to scene assist to get him up and dressed. Wife states he struck head against closet shelf several time attempting to get up. EXAM: CT HEAD WITHOUT CONTRAST TECHNIQUE: Contiguous axial images were obtained from the base of the skull through the vertex without intravenous contrast. COMPARISON:  Head CT 03/25/2016 FINDINGS: No acute intracranial hemorrhage. No focal mass lesion. No CT evidence of acute infarction. No midline shift or mass effect. No hydrocephalus. Basilar cisterns are patent. 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 There is extensive cortical atrophy. Moderate periventricular white matter hypodensities. There is thickening of the maxillary sinus walls. IMPRESSION: 1. No intracranial trauma. 2. 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 3. 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 4. Chronic sinus inflammation. Electronically Signed   By: Suzy Bouchard M.D.   On:  03/27/2016 21:55   Dg Hip Unilat With Pelvis 2-3 Views Left  03/27/2016  CLINICAL DATA:  Status post fall, with left hip pain. Initial encounter. EXAM: DG HIP (WITH OR WITHOUT PELVIS) 2-3V LEFT COMPARISON:  None. FINDINGS: There is no evidence of fracture or dislocation. Both femoral heads are seated normally within their respective acetabula. The proximal left femur appears intact. Mild degenerative change is noted at the lower lumbar spine. The sacroiliac joints are unremarkable in appearance. The visualized bowel gas pattern is grossly unremarkable in appearance. IMPRESSION: No evidence of fracture or dislocation. Electronically Signed   By: Garald Balding M.D.   On: 03/27/2016 22:09   I have personally reviewed and evaluated these images and lab results as part of my medical decision-making.   MDM  Pt's wife is worried about BPH and urinary retention at times.  I gave pt the number to urology.  Pt does have an appt in a few days with PCP.  He is instr to f/u with his Dr. Final diagnoses:  UTI (lower urinary tract infection)  Falls, initial encounter  Ambulatory dysfunction   I personally performed the services described in this documentation, which was scribed in my presence. The recorded information has been reviewed and is accurate.   Gerald Pence, MD 03/27/16 2300

## 2016-03-27 NOTE — ED Notes (Signed)
Pt fell while getting dressed has NT who found him on floor EMS called to scene assist to get him up and dressed wife states he struck head against closet shelf several time attempting to get up. Wife also state recent CT head d/t potential repeat stroke scene at [facility].

## 2016-03-27 NOTE — Telephone Encounter (Signed)
-----   Message from Pieter Partridge, DO sent at 03/27/2016  6:57 AM EDT ----- Gerald Holt has low B12.  Low B12 levels may cause memory and other cognitive changes.  I would recommend starting B12 shots. 1035mcg daily for 7 days, then once a week for 4 weeks, then once a month for a year.  I would repeat another level in 6 months.

## 2016-03-27 NOTE — ED Notes (Signed)
Patient transported to CT 

## 2016-03-28 ENCOUNTER — Telehealth: Payer: Self-pay

## 2016-03-28 DIAGNOSIS — I10 Essential (primary) hypertension: Secondary | ICD-10-CM | POA: Diagnosis not present

## 2016-03-28 DIAGNOSIS — K219 Gastro-esophageal reflux disease without esophagitis: Secondary | ICD-10-CM | POA: Diagnosis not present

## 2016-03-28 DIAGNOSIS — R296 Repeated falls: Secondary | ICD-10-CM | POA: Diagnosis not present

## 2016-03-28 DIAGNOSIS — E1165 Type 2 diabetes mellitus with hyperglycemia: Secondary | ICD-10-CM | POA: Diagnosis not present

## 2016-03-28 DIAGNOSIS — F0390 Unspecified dementia without behavioral disturbance: Secondary | ICD-10-CM | POA: Diagnosis not present

## 2016-03-28 DIAGNOSIS — I480 Paroxysmal atrial fibrillation: Secondary | ICD-10-CM | POA: Diagnosis not present

## 2016-03-28 NOTE — Telephone Encounter (Signed)
Please make sure patient has ER follow-up scheduled. If no openings tomorrow, would like to see him Monday.     ----- Message -----     From: SYSTEM     Sent: 03/27/2016 11:42 PM      To: [name], PA-C    Please call patient and schedule ER follow up.

## 2016-03-29 ENCOUNTER — Encounter: Payer: Self-pay | Admitting: Neurology

## 2016-03-29 DIAGNOSIS — R296 Repeated falls: Secondary | ICD-10-CM | POA: Diagnosis not present

## 2016-03-29 DIAGNOSIS — E1165 Type 2 diabetes mellitus with hyperglycemia: Secondary | ICD-10-CM | POA: Diagnosis not present

## 2016-03-29 DIAGNOSIS — K219 Gastro-esophageal reflux disease without esophagitis: Secondary | ICD-10-CM | POA: Diagnosis not present

## 2016-03-29 DIAGNOSIS — I480 Paroxysmal atrial fibrillation: Secondary | ICD-10-CM | POA: Diagnosis not present

## 2016-03-29 DIAGNOSIS — F0390 Unspecified dementia without behavioral disturbance: Secondary | ICD-10-CM | POA: Diagnosis not present

## 2016-03-29 DIAGNOSIS — I10 Essential (primary) hypertension: Secondary | ICD-10-CM | POA: Diagnosis not present

## 2016-03-29 DIAGNOSIS — E538 Deficiency of other specified B group vitamins: Secondary | ICD-10-CM

## 2016-03-30 LAB — URINE CULTURE

## 2016-03-31 ENCOUNTER — Telehealth (HOSPITAL_BASED_OUTPATIENT_CLINIC_OR_DEPARTMENT_OTHER): Payer: Self-pay

## 2016-03-31 NOTE — Telephone Encounter (Signed)
Post ED Visit - Positive Culture Follow-up  Culture report reviewed by antimicrobial stewardship pharmacist:  []  Elenor Quinones, Pharm.D. []  Heide Guile, Pharm.D., BCPS []  Parks Neptune, Pharm.D. []  Alycia Rossetti, Pharm.D., BCPS []  [place], Pharm.D., BCPS, AAHIVP []  Legrand Como, Pharm.D., BCPS, AAHIVP []  Milus Glazier, Pharm.D. []  Rob Evette Doffing, Pharm.DTiney Rouge Pharm D Positive urine culture Treated with Cephalexin, organism sensitive to the same and no further patient follow-up is required at this time.  Genia Del 03/31/2016, 11:03 AM

## 2016-04-01 ENCOUNTER — Encounter: Payer: Self-pay | Admitting: Physician Assistant

## 2016-04-01 ENCOUNTER — Ambulatory Visit (INDEPENDENT_AMBULATORY_CARE_PROVIDER_SITE_OTHER): Payer: Medicare Other | Admitting: Physician Assistant

## 2016-04-01 VITALS — BP 114/78 | HR 71 | Temp 97.9°F | Resp 18 | Wt 160.6 lb

## 2016-04-01 DIAGNOSIS — Y92009 Unspecified place in unspecified non-institutional (private) residence as the place of occurrence of the external cause: Secondary | ICD-10-CM

## 2016-04-01 DIAGNOSIS — W19XXXD Unspecified fall, subsequent encounter: Secondary | ICD-10-CM | POA: Diagnosis not present

## 2016-04-01 DIAGNOSIS — E538 Deficiency of other specified B group vitamins: Secondary | ICD-10-CM | POA: Diagnosis not present

## 2016-04-01 DIAGNOSIS — N3000 Acute cystitis without hematuria: Secondary | ICD-10-CM

## 2016-04-01 DIAGNOSIS — F0280 Dementia in other diseases classified elsewhere without behavioral disturbance: Secondary | ICD-10-CM

## 2016-04-01 DIAGNOSIS — G309 Alzheimer's disease, unspecified: Secondary | ICD-10-CM

## 2016-04-01 DIAGNOSIS — R339 Retention of urine, unspecified: Secondary | ICD-10-CM

## 2016-04-01 LAB — BASIC METABOLIC PANEL
BUN: 19 mg/dL (ref 6–23)
CALCIUM: 9.8 mg/dL (ref 8.4–10.5)
CHLORIDE: 98 meq/L (ref 96–112)
CO2: 27 mEq/L (ref 19–32)
CREATININE: 1.1 mg/dL (ref 0.40–1.50)
GFR: 66.78 mL/min (ref >60.00)
Glucose, Bld: 244 mg/dL — ABNORMAL HIGH (ref 70–99)
Potassium: 4.8 mEq/L (ref 3.5–5.1)
Sodium: 133 mEq/L — ABNORMAL LOW (ref 135–145)

## 2016-04-01 MED ORDER — CYANOCOBALAMIN 1000 MCG/ML IJ SOLN
1000.0000 ug | Freq: Once | INTRAMUSCULAR | Status: AC
  Administered 2016-04-01: 1000 ug via INTRAMUSCULAR

## 2016-04-01 MED ORDER — CLONAZEPAM 0.5 MG PO TABS: 0.5000 mg | ORAL_TABLET | Freq: Two times a day (BID) | ORAL | Status: DC | PRN

## 2016-04-01 NOTE — Telephone Encounter (Signed)
Please see previous mychart encounter.

## 2016-04-01 NOTE — Patient Instructions (Signed)
Please go to the lab for blood work. I will call with results.  Please continue medications as directed. Finish the antibiotic. We will have [facility] draw a repeat urine after finishing antibiotics.  You will be contacted for assessment by Urology. Follow-up with Dr. Tomi Likens as directed. Give some thought to the Aricept. Use the Klonopin as directed for agitation.

## 2016-04-01 NOTE — Telephone Encounter (Deleted)
Good Morning, Ellie!    Unfortunately, I cannot e-mail the office visit notes to you, we cannot attach them to the emails. However, I'd be happy to fax them where ever you would like me to if you could just get me that fax number. Or Dr. Hassell Done should be able to print that note out for you today, when you guys see him. Please just let me know if you would like me to fax the note for you. As for the B12 result Dr. Tomi Likens said, "Mr. Edley has low B12. Low B12 levels may cause memory and other cognitive changes. I would recommend starting B12 shots. 1025mcg daily for 7 days, then once a week for 4 weeks, then once a month for a year. I would repeat another level in 6 months." We would be happy to administer your B12's here in office, you may see if Dr. Earlie Server office can administer those for you, or we can see if we can get those order through the pharmacy so your CNA may administer them for you at home. Please just let me know what you prefer to do there as well. Thank you, hope you have a wonderful day!  Hargun Spurling CMA for Dr. Tomi Likens.

## 2016-04-01 NOTE — Progress Notes (Signed)
Pre visit review using our clinic review tool, if applicable. No additional management support is needed unless otherwise documented below in the visit note. 

## 2016-04-01 NOTE — Telephone Encounter (Signed)
Pt has an appt scheduled today (04/01/16) at 10:45 am with [facility].

## 2016-04-01 NOTE — Telephone Encounter (Signed)
Replied via mychart. Will await response to see how pt/family would like to proceed.

## 2016-04-01 NOTE — Telephone Encounter (Signed)
Appt scheduled for 04/01/16

## 2016-04-02 ENCOUNTER — Telehealth: Payer: Self-pay | Admitting: Physician Assistant

## 2016-04-02 DIAGNOSIS — K219 Gastro-esophageal reflux disease without esophagitis: Secondary | ICD-10-CM | POA: Diagnosis not present

## 2016-04-02 DIAGNOSIS — F0390 Unspecified dementia without behavioral disturbance: Secondary | ICD-10-CM | POA: Diagnosis not present

## 2016-04-02 DIAGNOSIS — I480 Paroxysmal atrial fibrillation: Secondary | ICD-10-CM | POA: Diagnosis not present

## 2016-04-02 DIAGNOSIS — E1165 Type 2 diabetes mellitus with hyperglycemia: Secondary | ICD-10-CM | POA: Diagnosis not present

## 2016-04-02 DIAGNOSIS — R296 Repeated falls: Secondary | ICD-10-CM | POA: Diagnosis not present

## 2016-04-02 DIAGNOSIS — I10 Essential (primary) hypertension: Secondary | ICD-10-CM | POA: Diagnosis not present

## 2016-04-02 MED ORDER — CYANOCOBALAMIN 1000 MCG/ML IJ SOLN: INTRAMUSCULAR | Status: DC

## 2016-04-02 MED ORDER — SYRINGE (DISPOSABLE) 3 ML MISC: 1.0000 | Status: DC

## 2016-04-02 MED ORDER — "NEEDLE (DISP) 25G X 1-1/2"" MISC": 1.0000 | Status: DC

## 2016-04-02 NOTE — Progress Notes (Signed)
Patient presents to clinic today with his wife and home health aid for follow-up regarding recent ER visits as well as discussion of recent Neurology appointment.   Patient first presented to ER on 03/25/2016 after noting low grade temperature (99.0) after a dental procedure. Wife was concerned for infection and too patient to ER. At that time, workup including EKG, labs and CT Head were negative for acute concerns. Old CVA noted on CT scan. Patient was afebrile and felt safe to be sent home. Patient had Neurology appointment later the same day.   At Neurology appointment later that day on 03/25/16, patient was assessed by Dr. Tomi Likens. Assessment was potential Alzheimer's dementia and Headache. For memory, Neurology recommended Aricept which wife stated she would give some thought. Lab panel obtained. B12 noted to be low. Physician recommended daily B12 injections x 1 week, then once weekly x 1 month, then monthly. This has not been started at present as wife wishes to discuss further with this provider. Melatonin started for sleep. For headache, felt related to tension and dental procedure for ESR checked to assess for temporal arteritis. This was negative.   Patient was again taken to ER on 03/27/2016 after sustaining a fall that morning while trying to get dressed. Patient's wife endorsed patient struck head on the closet. ER assessment included lab assessment (+ UTI), CT Head (negative for acute event; atrophy noted and chronic cortical infarction of R occipital lobe noted) , X-ray L hip (negative for fracture), CXR (negative for fracture). Patient stable in ER. Cath used to alleviate bladder. Some concern for urinary retention noted due to amount of urine. Patient discharged on Keflex 500 mg QID x 10 days. Urology assessment recommended.  Since that time, patient's wife notes patient has been doing well overall.  She would like to discuss Neurology's recommendations in further detail. Would also like to  discuss options for patient's restlessness and agitation at night. Denies daytime agitation. Is wandering at night and wife is worried about falls. Wife would also like home health RN to come out and draw labs. Would like to discuss need for home cath for this. Is giving patient his chronic medications and antibiotic as directed. Denies fever, chills, change to bowels. Denies complaints of abdominal pain or discomfort. Wife denies change in cognition compared to baseline. Patient has recently seen Audiologist and will be getting hearing aids soon.   Past Medical History  Diagnosis Date  . Stroke [facility]) Nov 2011  . Diabetes type 2, controlled ([place]) 2000  . History of chicken pox   . Whooping cough   . Mumps     Adult  . A-fib ([place])   . Cataracts, bilateral   . Retinopathy   . Undescended testicle, unilateral     Right    Current Outpatient Prescriptions on File Prior to Visit  Medication Sig Dispense Refill  . aspirin 81 MG tablet Take 1 tablet (81 mg total) by mouth daily.    . cephALEXin (KEFLEX) 500 MG capsule Take 1 capsule (500 mg total) by mouth 4 (four) times daily. 40 capsule 0  . Eyelid Cleansers (STERILID EX) Apply topically. Eye Wash: Once Daily    . levothyroxine (SYNTHROID, LEVOTHROID) 50 MCG tablet TAKE 1 TABLET EVERY DAY 90 tablet 1  . lisinopril (PRINIVIL,ZESTRIL) 2.5 MG tablet Take 1 tablet (2.5 mg total) by mouth daily. 90 tablet 3  . metFORMIN (GLUCOPHAGE-XR) 500 MG 24 hr tablet Take 2 tablets (1,000 mg total) by mouth daily with  breakfast. 180 tablet 1  . metoprolol tartrate (LOPRESSOR) 25 MG tablet Take 0.5 tablets (12.5 mg total) by mouth 2 (two) times daily. 180 tablet 1  . Omega-3 Fatty Acids (SB OMEGA-3 FISH OIL) 1000 MG CAPS Take 1 capsule by mouth 2 (two) times daily. Reported on 10/06/2015    . OVER THE COUNTER MEDICATION Take 1 each by mouth 2 (two) times daily. Focus Select Supplement: Vitamin C & E, Zinc & Copper    . triamcinolone cream (KENALOG) 0.1 %     .  cyanocobalamin (,VITAMIN B-12,) 1000 MCG/ML injection Inject 1000 mcg intramuscular daily for 7 days, then once a week for 4 weeks, then once a month for a year 25 mL 0  . lovastatin (MEVACOR) 10 MG tablet Reported on 04/01/2016    . NEEDLE, DISP, 25 G 25G X 1-1/2" MISC 1 each by Does not apply route as directed. Use to draw and inject B12 50 each 0  . Syringe, Disposable, 3 ML MISC 1 each by Does not apply route as directed. 25 each 0   No current facility-administered medications on file prior to visit.    Allergies  Allergen Reactions  . Shellfish Allergy Anaphylaxis  . Aspirin-Dipyridamole Er     Unknown per patient  . Other Other (See Comments)    Blood Thinner given in Rehab gave H/As - Not Coumadin     Family History  Problem Relation Age of Onset  . Pneumonia Mother 31    Deceased  . GI Bleed Father 86    Deceased - Ulcers  . Diabetes Cousin   . Diabetes Brother   . Cancer Paternal Aunt     Social History   Social History  . Marital Status: Married    Spouse Name: N/A  . Number of Children: N/A  . Years of Education: N/A   Social History Main Topics  . Smoking status: Former Research scientist (life sciences)  . Smokeless tobacco: Never Used     Comment: Hasn't smoked since age 15  . Alcohol Use: No  . Drug Use: No  . Sexual Activity: Not Asked   Other Topics Concern  . None   Social History Narrative   Lives with wife in a one story home.  Has 1 child.  Retired Armed forces technical officer.  Education: Oceanographer.      Review of Systems - See HPI.  All other ROS are negative.  BP 114/78 mmHg  Pulse 71  Temp(Src) 97.9 F (36.6 C) (Oral)  Resp 18  Wt 160 lb 9.6 oz (72.848 kg)  SpO2 97%  Physical Exam  Constitutional: He is well-developed, well-nourished, and in no distress.  Patient resting comfortably on examination table.  HENT:  Head: Normocephalic and atraumatic.  Eyes: Conjunctivae are normal.  Neck: Neck supple.  Cardiovascular: Normal rate, regular rhythm, normal heart sounds and  intact distal pulses.   Pulmonary/Chest: Effort normal and breath sounds normal. No respiratory distress. He has no wheezes. He has no rales. He exhibits no tenderness.  Abdominal: Soft. There is no tenderness.  Neurological: He is alert.  Alert to person but not place or time. No distress or agitation noted.   Skin: Skin is warm and dry.  Psychiatric: Affect normal.  Vitals reviewed.   Recent Results (from the past 2160 hour(s))  CBC with Differential     Status: Abnormal   Collection Time: 03/25/16  4:02 AM  Result Value Ref Range   WBC 8.4 4.0 - 10.5 K/uL   RBC 4.19 (L)  4.22 - 5.81 MIL/uL   Hemoglobin 12.6 (L) 13.0 - 17.0 g/dL   HCT 36.6 (L) 39.0 - 52.0 %   MCV 87.4 78.0 - 100.0 fL   MCH 30.1 26.0 - 34.0 pg   MCHC 34.4 30.0 - 36.0 g/dL   RDW 13.0 11.5 - 15.5 %   Platelets 209 150 - 400 K/uL   Neutrophils Relative % 45 %   Neutro Abs 3.9 1.7 - 7.7 K/uL   Lymphocytes Relative 35 %   Lymphs Abs 2.9 0.7 - 4.0 K/uL   Monocytes Relative 9 %   Monocytes Absolute 0.7 0.1 - 1.0 K/uL   Eosinophils Relative 10 %   Eosinophils Absolute 0.8 (H) 0.0 - 0.7 K/uL   Basophils Relative 1 %   Basophils Absolute 0.0 0.0 - 0.1 K/uL  Comprehensive metabolic panel     Status: Abnormal   Collection Time: 03/25/16  4:02 AM  Result Value Ref Range   Sodium 136 135 - 145 mmol/L   Potassium 4.3 3.5 - 5.1 mmol/L   Chloride 101 101 - 111 mmol/L   CO2 26 22 - 32 mmol/L   Glucose, Bld 198 (H) 65 - 99 mg/dL   BUN 19 6 - 20 mg/dL   Creatinine, Ser 0.91 0.61 - 1.24 mg/dL   Calcium 9.3 8.9 - 10.3 mg/dL   Total Protein 7.2 6.5 - 8.1 g/dL   Albumin 3.6 3.5 - 5.0 g/dL   AST 16 15 - 41 U/L   ALT 18 17 - 63 U/L   Alkaline Phosphatase 48 38 - 126 U/L   Total Bilirubin 1.0 0.3 - 1.2 mg/dL   GFR calc non Af Amer >60 >60 mL/min   GFR calc Af Amer >60 >60 mL/min    Comment: (NOTE) The eGFR has been calculated using the CKD EPI equation. This calculation has not been validated in all clinical  situations. eGFR's persistently <60 mL/min signify possible Chronic Kidney Disease.    Anion gap 9 5 - 15  Troponin I     Status: None   Collection Time: 03/25/16  4:02 AM  Result Value Ref Range   Troponin I <0.03 <0.03 ng/mL  Urinalysis, Routine w reflex microscopic (not at [facility])     Status: Abnormal   Collection Time: 03/25/16  6:00 AM  Result Value Ref Range   Color, Urine YELLOW YELLOW   APPearance CLEAR CLEAR   Specific Gravity, Urine 1.012 1.005 - 1.030   pH 7.0 5.0 - 8.0   Glucose, UA 100 (A) NEGATIVE mg/dL   Hgb urine dipstick NEGATIVE NEGATIVE   Bilirubin Urine NEGATIVE NEGATIVE   Ketones, ur NEGATIVE NEGATIVE mg/dL   Protein, ur NEGATIVE NEGATIVE mg/dL   Nitrite NEGATIVE NEGATIVE   Leukocytes, UA NEGATIVE NEGATIVE    Comment: MICROSCOPIC NOT DONE ON URINES WITH NEGATIVE PROTEIN, BLOOD, LEUKOCYTES, NITRITE, OR GLUCOSE <1000 mg/dL.  Vitamin B12     Status: Abnormal   Collection Time: 03/25/16  4:32 PM  Result Value Ref Range   Vitamin B-12 149 (L) 211 - 911 pg/mL  Sedimentation rate     Status: None   Collection Time: 03/25/16  4:32 PM  Result Value Ref Range   Sed Rate 15 0 - 20 mm/hr  CBG monitoring, ED     Status: Abnormal   Collection Time: 03/27/16  8:33 PM  Result Value Ref Range   Glucose-Capillary 159 (H) 65 - 99 mg/dL  CBC with Differential     Status: Abnormal   Collection  Time: 03/27/16  8:35 PM  Result Value Ref Range   WBC 11.8 (H) 4.0 - 10.5 K/uL   RBC 4.01 (L) 4.22 - 5.81 MIL/uL   Hemoglobin 12.1 (L) 13.0 - 17.0 g/dL   HCT 35.2 (L) 39.0 - 52.0 %   MCV 87.8 78.0 - 100.0 fL   MCH 30.2 26.0 - 34.0 pg   MCHC 34.4 30.0 - 36.0 g/dL   RDW 12.9 11.5 - 15.5 %   Platelets 240 150 - 400 K/uL   Neutrophils Relative % 68 %   Neutro Abs 8.0 (H) 1.7 - 7.7 K/uL   Lymphocytes Relative 21 %   Lymphs Abs 2.4 0.7 - 4.0 K/uL   Monocytes Relative 8 %   Monocytes Absolute 1.0 0.1 - 1.0 K/uL   Eosinophils Relative 3 %   Eosinophils Absolute 0.3 0.0 - 0.7 K/uL    Basophils Relative 0 %   Basophils Absolute 0.1 0.0 - 0.1 K/uL  Comprehensive metabolic panel     Status: Abnormal   Collection Time: 03/27/16  8:35 PM  Result Value Ref Range   Sodium 138 135 - 145 mmol/L   Potassium 4.8 3.5 - 5.1 mmol/L   Chloride 103 101 - 111 mmol/L   CO2 25 22 - 32 mmol/L   Glucose, Bld 167 (H) 65 - 99 mg/dL   BUN 19 6 - 20 mg/dL   Creatinine, Ser 1.04 0.61 - 1.24 mg/dL   Calcium 9.3 8.9 - 10.3 mg/dL   Total Protein 6.8 6.5 - 8.1 g/dL   Albumin 3.5 3.5 - 5.0 g/dL   AST 19 15 - 41 U/L   ALT 19 17 - 63 U/L   Alkaline Phosphatase 54 38 - 126 U/L   Total Bilirubin 0.6 0.3 - 1.2 mg/dL   GFR calc non Af Amer >60 >60 mL/min   GFR calc Af Amer >60 >60 mL/min    Comment: (NOTE) The eGFR has been calculated using the CKD EPI equation. This calculation has not been validated in all clinical situations. eGFR's persistently <60 mL/min signify possible Chronic Kidney Disease.    Anion gap 10 5 - 15  Protime-INR     Status: None   Collection Time: 03/27/16  8:35 PM  Result Value Ref Range   Prothrombin Time 12.9 11.6 - 15.2 seconds   INR 0.95 0.00 - 1.49  Troponin I     Status: None   Collection Time: 03/27/16  8:35 PM  Result Value Ref Range   Troponin I <0.03 <0.03 ng/mL  Urinalysis, Routine w reflex microscopic     Status: Abnormal   Collection Time: 03/27/16  8:38 PM  Result Value Ref Range   Color, Urine YELLOW YELLOW   APPearance TURBID (A) CLEAR   Specific Gravity, Urine 1.015 1.005 - 1.030   pH 6.0 5.0 - 8.0   Glucose, UA 500 (A) NEGATIVE mg/dL   Hgb urine dipstick MODERATE (A) NEGATIVE   Bilirubin Urine NEGATIVE NEGATIVE   Ketones, ur NEGATIVE NEGATIVE mg/dL   Protein, ur NEGATIVE NEGATIVE mg/dL   Nitrite NEGATIVE NEGATIVE   Leukocytes, UA LARGE (A) NEGATIVE  Urine microscopic-add on     Status: Abnormal   Collection Time: 03/27/16  8:38 PM  Result Value Ref Range   Squamous Epithelial / LPF 0-5 (A) NONE SEEN   WBC, UA TOO NUMEROUS TO COUNT 0 - 5  WBC/hpf   RBC / HPF 6-30 0 - 5 RBC/hpf   Bacteria, UA MANY (A) NONE SEEN  Urine-Other MUCOUS PRESENT   Urine culture     Status: Abnormal   Collection Time: 03/27/16  8:38 PM  Result Value Ref Range   Specimen Description URINE, CLEAN CATCH    Special Requests NONE    Culture >=100,000 COLONIES/mL ENTEROCOCCUS SPECIES (A)    Report Status 03/30/2016 FINAL    Organism ID, Bacteria ENTEROCOCCUS SPECIES (A)       Susceptibility   Enterococcus species - MIC*    AMPICILLIN <=2 SENSITIVE Sensitive     LEVOFLOXACIN 1 SENSITIVE Sensitive     NITROFURANTOIN <=16 SENSITIVE Sensitive     VANCOMYCIN 1 SENSITIVE Sensitive     * >=100,000 COLONIES/mL ENTEROCOCCUS SPECIES  Basic metabolic panel     Status: Abnormal   Collection Time: 04/01/16 12:40 PM  Result Value Ref Range   Sodium 133 (L) 135 - 145 mEq/L   Potassium 4.8 3.5 - 5.1 mEq/L   Chloride 98 96 - 112 mEq/L   CO2 27 19 - 32 mEq/L   Glucose, Bld 244 (H) 70 - 99 mg/dL   BUN 19 6 - 23 mg/dL   Creatinine, Ser 1.10 0.40 - 1.50 mg/dL   Calcium 9.8 8.4 - 10.5 mg/dL   GFR 66.78 >60.00 mL/min    Assessment/Plan: 1. Acute cystitis without hematuria Diagnosed in ER 03/27/16. Tolerating Keflex well. Taking QID as directed. Continue hydration. Will finish entire course of antibiotic. Will have home health draw clean catch urine specimen to assess resolution once antibiotic is completed. Alarm signs/symptoms reviewed with patient's wife. - Basic metabolic panel  2. Vitamin B12 deficiency Discussed deficiency's affect on mood, memory and neuropathy. Wife agrees we should start injections. 1,000 mcg B12 injection given today. Will help set up home health RN to give remaining injections. - cyanocobalamin ((VITAMIN B-12)) injection 1,000 mcg; Inject 1 mL (1,000 mcg total) into the muscle once.  3. Alzheimer's dementia Diagnosed by Neurology. Discussed benefits of Aricept but that there is nothing that will stop Dementia. Wife wishes to hold on  medication for now since patient is starting B12 injections. She will FU with Neurology.  4. Fall at home, subsequent encounter Imaging negative. Patient is being treated for UTI at present. Is only using walker now. Discussed risks for falls. He is starting OP physical therapy to help with strengthening. May need to have RN come out reassess for fall risks in the home.   5. Urinary retention Referral to Urology placed -- Dr. Venia Minks for assessment and PVR check. Giving advanced age and dementia, doubt any usefulness of checking a PSA at this point. May benefit from Rapaflo versus chronic cath, but will defer to Urology.   Leeanne Rio, PA-C

## 2016-04-02 NOTE — Addendum Note (Signed)
Addended by: Gerda Diss A on: 04/02/2016 07:24 AM   Modules accepted: Orders

## 2016-04-02 NOTE — Telephone Encounter (Signed)
Spoke with caller and gave VO for increase in therapy per provider/SLS 07/11

## 2016-04-02 NOTE — Telephone Encounter (Signed)
Caller name:Amy Relationship to [place] Can be reached:[phone] Pharmacy:  Reason for call:Requesting to increase home health to weekly. Please advise

## 2016-04-02 NOTE — Telephone Encounter (Signed)
Note is complete. Ok to print for pick up.

## 2016-04-02 NOTE — Telephone Encounter (Signed)
Caller name:Elnora  Relationship to patient: Wife  Can be reached: [phone]   Reason for call: Wife is requesting a copy of the Office Visit notes from visit on April 01, 2016. Also wants to know if the Office Notes from his last Neurology appt can be printed out here.

## 2016-04-02 NOTE — Telephone Encounter (Signed)
Spoke with caller [spouse] and informed her that she could p/u our OV note tomorrow, but that we cannot release other offices and/or facility notes and that she will need to contact Neurology for those; Elnora understood & agreed/SLS 07/11

## 2016-04-03 DIAGNOSIS — I480 Paroxysmal atrial fibrillation: Secondary | ICD-10-CM | POA: Diagnosis not present

## 2016-04-03 DIAGNOSIS — E1165 Type 2 diabetes mellitus with hyperglycemia: Secondary | ICD-10-CM | POA: Diagnosis not present

## 2016-04-03 DIAGNOSIS — R296 Repeated falls: Secondary | ICD-10-CM | POA: Diagnosis not present

## 2016-04-03 DIAGNOSIS — K219 Gastro-esophageal reflux disease without esophagitis: Secondary | ICD-10-CM | POA: Diagnosis not present

## 2016-04-03 DIAGNOSIS — I10 Essential (primary) hypertension: Secondary | ICD-10-CM | POA: Diagnosis not present

## 2016-04-03 DIAGNOSIS — F0390 Unspecified dementia without behavioral disturbance: Secondary | ICD-10-CM | POA: Diagnosis not present

## 2016-04-05 ENCOUNTER — Telehealth: Payer: Self-pay | Admitting: Physician Assistant

## 2016-04-05 ENCOUNTER — Encounter: Payer: Self-pay | Admitting: Physician Assistant

## 2016-04-05 DIAGNOSIS — E1165 Type 2 diabetes mellitus with hyperglycemia: Secondary | ICD-10-CM | POA: Diagnosis not present

## 2016-04-05 DIAGNOSIS — R296 Repeated falls: Secondary | ICD-10-CM | POA: Diagnosis not present

## 2016-04-05 DIAGNOSIS — I10 Essential (primary) hypertension: Secondary | ICD-10-CM | POA: Diagnosis not present

## 2016-04-05 DIAGNOSIS — I480 Paroxysmal atrial fibrillation: Secondary | ICD-10-CM | POA: Diagnosis not present

## 2016-04-05 DIAGNOSIS — K219 Gastro-esophageal reflux disease without esophagitis: Secondary | ICD-10-CM | POA: Diagnosis not present

## 2016-04-05 DIAGNOSIS — F0390 Unspecified dementia without behavioral disturbance: Secondary | ICD-10-CM | POA: Diagnosis not present

## 2016-04-05 MED ORDER — METFORMIN HCL ER 500 MG PO TB24: 1000.0000 mg | ORAL_TABLET | Freq: Every day | ORAL | Status: DC

## 2016-04-05 NOTE — Telephone Encounter (Signed)
I have taken care of this personally and have communicated with patient and wife via [place]. Temporary supply sent to local pharmacy.

## 2016-04-05 NOTE — Telephone Encounter (Signed)
Caller name:Terada,Elnora Relation to SG:[phone]  Call back number:[phone] Pharmacy: [place], [place] [phone] (Phone) [phone] (Fax)        Reason for call:  Spouse states [employer] lost metformin prescription therefore there's a delay regarding mail order and would like PCP to send 30 day supply to retail. Advised spouse Carita Pian has 1 refill, spouse stated she spoke with pharmacy today and refill doesn't reflect in there system. Please advise

## 2016-04-05 NOTE — Telephone Encounter (Signed)
°  Relationship to patient: Self Can be reached: [phone] Pharmacy:  [place], [place] [phone] (Phone) [phone] (Fax)         Reason for call: Patient requesting enough Metformin to last until he receives his mail order. States that it is en route and should only be a few days.

## 2016-04-09 ENCOUNTER — Encounter: Payer: Self-pay | Admitting: Physician Assistant

## 2016-04-09 MED ORDER — LISINOPRIL 2.5 MG PO TABS: 2.5000 mg | ORAL_TABLET | Freq: Every day | ORAL | Status: DC

## 2016-04-09 MED ORDER — METOPROLOL TARTRATE 25 MG PO TABS: 12.5000 mg | ORAL_TABLET | Freq: Two times a day (BID) | ORAL | Status: DC

## 2016-04-09 MED ORDER — LEVOTHYROXINE SODIUM 50 MCG PO TABS: 50.0000 ug | ORAL_TABLET | Freq: Every day | ORAL | Status: DC

## 2016-04-09 MED ORDER — METFORMIN HCL ER 500 MG PO TB24: 1000.0000 mg | ORAL_TABLET | Freq: Every day | ORAL | Status: DC

## 2016-04-09 NOTE — Telephone Encounter (Signed)
Verbal orders called to [place] [facility] as directed below.

## 2016-04-09 NOTE — Telephone Encounter (Signed)
Gerald Holt,  Please see MyChart message. I need [name] to contact [facility] -- he needs orders given to RN for repeat UA with urine culture, A1C. He also is to be started on once weekly B12 injections x 4 weeks. Patient has medication.   Thank you.

## 2016-04-10 DIAGNOSIS — I10 Essential (primary) hypertension: Secondary | ICD-10-CM | POA: Diagnosis not present

## 2016-04-10 DIAGNOSIS — E1165 Type 2 diabetes mellitus with hyperglycemia: Secondary | ICD-10-CM | POA: Diagnosis not present

## 2016-04-10 DIAGNOSIS — R296 Repeated falls: Secondary | ICD-10-CM | POA: Diagnosis not present

## 2016-04-10 DIAGNOSIS — K219 Gastro-esophageal reflux disease without esophagitis: Secondary | ICD-10-CM | POA: Diagnosis not present

## 2016-04-10 DIAGNOSIS — F0390 Unspecified dementia without behavioral disturbance: Secondary | ICD-10-CM | POA: Diagnosis not present

## 2016-04-10 DIAGNOSIS — I480 Paroxysmal atrial fibrillation: Secondary | ICD-10-CM | POA: Diagnosis not present

## 2016-04-11 ENCOUNTER — Encounter: Payer: Self-pay | Admitting: Physician Assistant

## 2016-04-11 ENCOUNTER — Telehealth: Payer: Self-pay | Admitting: Physician Assistant

## 2016-04-11 DIAGNOSIS — I480 Paroxysmal atrial fibrillation: Secondary | ICD-10-CM | POA: Diagnosis not present

## 2016-04-11 DIAGNOSIS — R296 Repeated falls: Secondary | ICD-10-CM | POA: Diagnosis not present

## 2016-04-11 DIAGNOSIS — I10 Essential (primary) hypertension: Secondary | ICD-10-CM | POA: Diagnosis not present

## 2016-04-11 DIAGNOSIS — E1165 Type 2 diabetes mellitus with hyperglycemia: Secondary | ICD-10-CM | POA: Diagnosis not present

## 2016-04-11 DIAGNOSIS — F0390 Unspecified dementia without behavioral disturbance: Secondary | ICD-10-CM | POA: Diagnosis not present

## 2016-04-11 DIAGNOSIS — K219 Gastro-esophageal reflux disease without esophagitis: Secondary | ICD-10-CM | POA: Diagnosis not present

## 2016-04-11 NOTE — Telephone Encounter (Signed)
°  Relationship to patient: Amy w/[facility] Can be reached: [phone]    Reason for call: Patient's wife informed [facility] nurse that PCP wanted them to do an A1C on patient but they have no order. Plse adv. Nurse will be seeing patient this morning and again next week.

## 2016-04-11 NOTE — Telephone Encounter (Signed)
Please advise 

## 2016-04-11 NOTE — Telephone Encounter (Signed)
Order was to be faxed in last week. See previous notes. Needs a UA, urine culture and A1C drawn.  Please help make sure this is relayed to the nurse.

## 2016-04-11 NOTE — Telephone Encounter (Signed)
Called and spoke w/ Amy. She never received previous orders, although I did call verbal orders on 04/09/16. She will plan to visit patient again tomorrow to collect labs.

## 2016-04-12 DIAGNOSIS — N390 Urinary tract infection, site not specified: Secondary | ICD-10-CM | POA: Diagnosis not present

## 2016-04-12 DIAGNOSIS — R296 Repeated falls: Secondary | ICD-10-CM | POA: Diagnosis not present

## 2016-04-12 DIAGNOSIS — K219 Gastro-esophageal reflux disease without esophagitis: Secondary | ICD-10-CM | POA: Diagnosis not present

## 2016-04-12 DIAGNOSIS — I480 Paroxysmal atrial fibrillation: Secondary | ICD-10-CM | POA: Diagnosis not present

## 2016-04-12 DIAGNOSIS — F0390 Unspecified dementia without behavioral disturbance: Secondary | ICD-10-CM | POA: Diagnosis not present

## 2016-04-12 DIAGNOSIS — I10 Essential (primary) hypertension: Secondary | ICD-10-CM | POA: Diagnosis not present

## 2016-04-12 DIAGNOSIS — E1165 Type 2 diabetes mellitus with hyperglycemia: Secondary | ICD-10-CM | POA: Diagnosis not present

## 2016-04-15 ENCOUNTER — Encounter: Payer: Self-pay | Admitting: Physician Assistant

## 2016-04-16 ENCOUNTER — Telehealth: Payer: Self-pay | Admitting: Physician Assistant

## 2016-04-16 DIAGNOSIS — I10 Essential (primary) hypertension: Secondary | ICD-10-CM | POA: Diagnosis not present

## 2016-04-16 DIAGNOSIS — R296 Repeated falls: Secondary | ICD-10-CM | POA: Diagnosis not present

## 2016-04-16 DIAGNOSIS — I480 Paroxysmal atrial fibrillation: Secondary | ICD-10-CM | POA: Diagnosis not present

## 2016-04-16 DIAGNOSIS — E1165 Type 2 diabetes mellitus with hyperglycemia: Secondary | ICD-10-CM | POA: Diagnosis not present

## 2016-04-16 DIAGNOSIS — K219 Gastro-esophageal reflux disease without esophagitis: Secondary | ICD-10-CM | POA: Diagnosis not present

## 2016-04-16 DIAGNOSIS — F0390 Unspecified dementia without behavioral disturbance: Secondary | ICD-10-CM | POA: Diagnosis not present

## 2016-04-16 NOTE — Telephone Encounter (Signed)
Gerald Holt,  Will you call in an order to [facility] for a post-void I/O cath so we can see if he is retaining urine? Has appt with Urology next week but wife is concerned he may need to be seen sooner. No other symptoms.   Thank you.

## 2016-04-16 NOTE — Telephone Encounter (Addendum)
Verbal orders called to [place] at [facility] as directed. Pt's next [facility] appt is tomorrow and post-void residual will be collected at that visit.

## 2016-04-16 NOTE — Telephone Encounter (Signed)
Caller name: Amber Relationship to patient: [facility] Can be reached: [phone]   Reason for call: [facility] is requesting 3 more home visits. They would like one more for this week and two visits for next week,

## 2016-04-17 NOTE — Telephone Encounter (Signed)
Spoke with [employer], gave VO for PT per provider, understood & agreed/SLS 07/26

## 2016-04-18 ENCOUNTER — Telehealth: Payer: Self-pay | Admitting: *Deleted

## 2016-04-18 DIAGNOSIS — I480 Paroxysmal atrial fibrillation: Secondary | ICD-10-CM | POA: Diagnosis not present

## 2016-04-18 DIAGNOSIS — E1165 Type 2 diabetes mellitus with hyperglycemia: Secondary | ICD-10-CM | POA: Diagnosis not present

## 2016-04-18 DIAGNOSIS — K219 Gastro-esophageal reflux disease without esophagitis: Secondary | ICD-10-CM | POA: Diagnosis not present

## 2016-04-18 DIAGNOSIS — R296 Repeated falls: Secondary | ICD-10-CM | POA: Diagnosis not present

## 2016-04-18 DIAGNOSIS — F0390 Unspecified dementia without behavioral disturbance: Secondary | ICD-10-CM | POA: Diagnosis not present

## 2016-04-18 DIAGNOSIS — I10 Essential (primary) hypertension: Secondary | ICD-10-CM | POA: Diagnosis not present

## 2016-04-18 NOTE — Telephone Encounter (Signed)
Call from [place], nurse w/ [facility]. She is at the pt's house performing straight cath for PVR per order from PCP. Pt has returned 900cc urine so far. Per [facility] policy, they are not allowed to draw off > 1000cc without permission from provider. Per Mackie Pai, PA-C, ok to drain bladder until empty and call w/ total volume.   Amy returned call, total PVR volume: 925cc.   Pt asymptomatic prior to catheterization, no abd pain, urinary pain, fever. She is not sure how much pt voided before catheterization as he did so in the bathroom and it was not measured.  CB#: [phone]

## 2016-04-19 DIAGNOSIS — E1165 Type 2 diabetes mellitus with hyperglycemia: Secondary | ICD-10-CM | POA: Diagnosis not present

## 2016-04-19 DIAGNOSIS — I10 Essential (primary) hypertension: Secondary | ICD-10-CM | POA: Diagnosis not present

## 2016-04-19 DIAGNOSIS — I480 Paroxysmal atrial fibrillation: Secondary | ICD-10-CM | POA: Diagnosis not present

## 2016-04-19 DIAGNOSIS — F0390 Unspecified dementia without behavioral disturbance: Secondary | ICD-10-CM | POA: Diagnosis not present

## 2016-04-19 DIAGNOSIS — K219 Gastro-esophageal reflux disease without esophagitis: Secondary | ICD-10-CM | POA: Diagnosis not present

## 2016-04-19 DIAGNOSIS — R296 Repeated falls: Secondary | ICD-10-CM | POA: Diagnosis not present

## 2016-04-19 NOTE — Telephone Encounter (Signed)
Thank you for making me aware. I am glad we got him emptied out. Clearly retaining if he went to the bathroom before cath. Verify he still has appt with Urology on Monday. Make sure to remind them to go to that appointment.

## 2016-04-22 ENCOUNTER — Encounter: Payer: Self-pay | Admitting: Family Medicine

## 2016-04-22 ENCOUNTER — Encounter: Payer: Self-pay | Admitting: Physician Assistant

## 2016-04-22 DIAGNOSIS — N3000 Acute cystitis without hematuria: Secondary | ICD-10-CM | POA: Diagnosis not present

## 2016-04-22 DIAGNOSIS — R339 Retention of urine, unspecified: Secondary | ICD-10-CM | POA: Diagnosis not present

## 2016-04-22 DIAGNOSIS — I482 Chronic atrial fibrillation: Secondary | ICD-10-CM | POA: Diagnosis not present

## 2016-04-22 NOTE — Telephone Encounter (Signed)
I was out of the office Friday and am just seeing this message. I called and spoke w/ pt's sister in law, Remo Lipps, and she verified that he does have appt w/ urology today and they will have office notes faxed to [name].

## 2016-04-23 ENCOUNTER — Telehealth: Payer: Self-pay | Admitting: Physician Assistant

## 2016-04-23 ENCOUNTER — Encounter: Payer: Self-pay | Admitting: Physician Assistant

## 2016-04-23 ENCOUNTER — Telehealth: Payer: Self-pay | Admitting: *Deleted

## 2016-04-23 DIAGNOSIS — R938 Abnormal findings on diagnostic imaging of other specified body structures: Secondary | ICD-10-CM | POA: Diagnosis not present

## 2016-04-23 DIAGNOSIS — Z79899 Other long term (current) drug therapy: Secondary | ICD-10-CM | POA: Diagnosis not present

## 2016-04-23 DIAGNOSIS — G309 Alzheimer's disease, unspecified: Secondary | ICD-10-CM | POA: Diagnosis not present

## 2016-04-23 DIAGNOSIS — N3289 Other specified disorders of bladder: Secondary | ICD-10-CM | POA: Diagnosis not present

## 2016-04-23 DIAGNOSIS — I4891 Unspecified atrial fibrillation: Secondary | ICD-10-CM | POA: Diagnosis not present

## 2016-04-23 DIAGNOSIS — R2689 Other abnormalities of gait and mobility: Secondary | ICD-10-CM | POA: Diagnosis not present

## 2016-04-23 DIAGNOSIS — F0150 Vascular dementia without behavioral disturbance: Secondary | ICD-10-CM | POA: Diagnosis not present

## 2016-04-23 DIAGNOSIS — R4182 Altered mental status, unspecified: Secondary | ICD-10-CM | POA: Diagnosis not present

## 2016-04-23 DIAGNOSIS — R339 Retention of urine, unspecified: Secondary | ICD-10-CM | POA: Diagnosis not present

## 2016-04-23 DIAGNOSIS — R3914 Feeling of incomplete bladder emptying: Secondary | ICD-10-CM | POA: Diagnosis not present

## 2016-04-23 DIAGNOSIS — Z9889 Other specified postprocedural states: Secondary | ICD-10-CM | POA: Diagnosis not present

## 2016-04-23 DIAGNOSIS — J329 Chronic sinusitis, unspecified: Secondary | ICD-10-CM | POA: Diagnosis not present

## 2016-04-23 DIAGNOSIS — R5383 Other fatigue: Secondary | ICD-10-CM | POA: Diagnosis not present

## 2016-04-23 DIAGNOSIS — I10 Essential (primary) hypertension: Secondary | ICD-10-CM | POA: Diagnosis not present

## 2016-04-23 DIAGNOSIS — R338 Other retention of urine: Secondary | ICD-10-CM | POA: Diagnosis not present

## 2016-04-23 DIAGNOSIS — E119 Type 2 diabetes mellitus without complications: Secondary | ICD-10-CM | POA: Diagnosis not present

## 2016-04-23 DIAGNOSIS — Z87891 Personal history of nicotine dependence: Secondary | ICD-10-CM | POA: Diagnosis not present

## 2016-04-23 DIAGNOSIS — N390 Urinary tract infection, site not specified: Secondary | ICD-10-CM | POA: Diagnosis not present

## 2016-04-23 DIAGNOSIS — I959 Hypotension, unspecified: Secondary | ICD-10-CM | POA: Diagnosis not present

## 2016-04-23 DIAGNOSIS — R402421 Glasgow coma scale score 9-12, in the field [EMT or ambulance]: Secondary | ICD-10-CM | POA: Diagnosis not present

## 2016-04-23 DIAGNOSIS — J9811 Atelectasis: Secondary | ICD-10-CM | POA: Diagnosis not present

## 2016-04-23 DIAGNOSIS — R55 Syncope and collapse: Secondary | ICD-10-CM | POA: Diagnosis not present

## 2016-04-23 DIAGNOSIS — N401 Enlarged prostate with lower urinary tract symptoms: Secondary | ICD-10-CM | POA: Diagnosis not present

## 2016-04-23 DIAGNOSIS — Z7982 Long term (current) use of aspirin: Secondary | ICD-10-CM | POA: Diagnosis not present

## 2016-04-23 DIAGNOSIS — Z8673 Personal history of transient ischemic attack (TIA), and cerebral infarction without residual deficits: Secondary | ICD-10-CM | POA: Diagnosis not present

## 2016-04-23 NOTE — Telephone Encounter (Signed)
Left message for Amy at [facility] giving verbal orders for home health for Mr. Hori. She was asked to call back with any questions.

## 2016-04-23 NOTE — Telephone Encounter (Signed)
Received completed and signed paperwork from Dr. Charlett Blake.  All paperwork faxed to [facility] at [phone]).  Confirmation received.//AB/CMA   Received orders paperwork via fax from [facility].  Placed in folder for Dr. Charlett Blake to review and sign.//AB/CMA

## 2016-04-23 NOTE — Telephone Encounter (Signed)
Please advise 

## 2016-04-23 NOTE — Telephone Encounter (Signed)
Verbal order granted. 

## 2016-04-23 NOTE — Telephone Encounter (Signed)
Caller name: Amy Relation to pt: RN from [facility] Call back number: [phone]    Reason for call:  Requesting verbal orders for home health nurse visit.

## 2016-04-24 DIAGNOSIS — R55 Syncope and collapse: Secondary | ICD-10-CM | POA: Diagnosis not present

## 2016-04-25 DIAGNOSIS — R55 Syncope and collapse: Secondary | ICD-10-CM | POA: Diagnosis not present

## 2016-04-26 ENCOUNTER — Encounter: Payer: Self-pay | Admitting: Physician Assistant

## 2016-04-26 ENCOUNTER — Telehealth: Payer: Self-pay | Admitting: Physician Assistant

## 2016-04-26 DIAGNOSIS — I10 Essential (primary) hypertension: Secondary | ICD-10-CM | POA: Diagnosis not present

## 2016-04-26 DIAGNOSIS — R296 Repeated falls: Secondary | ICD-10-CM | POA: Diagnosis not present

## 2016-04-26 DIAGNOSIS — I480 Paroxysmal atrial fibrillation: Secondary | ICD-10-CM | POA: Diagnosis not present

## 2016-04-26 DIAGNOSIS — K219 Gastro-esophageal reflux disease without esophagitis: Secondary | ICD-10-CM | POA: Diagnosis not present

## 2016-04-26 DIAGNOSIS — E1165 Type 2 diabetes mellitus with hyperglycemia: Secondary | ICD-10-CM | POA: Diagnosis not present

## 2016-04-26 DIAGNOSIS — F0390 Unspecified dementia without behavioral disturbance: Secondary | ICD-10-CM | POA: Diagnosis not present

## 2016-04-26 NOTE — Telephone Encounter (Signed)
[facility] - [phone]  She called in to receive verbal orders to continue (extend) current scheduled home health care.

## 2016-04-27 DIAGNOSIS — I10 Essential (primary) hypertension: Secondary | ICD-10-CM | POA: Diagnosis not present

## 2016-04-27 DIAGNOSIS — F0390 Unspecified dementia without behavioral disturbance: Secondary | ICD-10-CM | POA: Diagnosis not present

## 2016-04-27 DIAGNOSIS — I480 Paroxysmal atrial fibrillation: Secondary | ICD-10-CM | POA: Diagnosis not present

## 2016-04-27 DIAGNOSIS — K219 Gastro-esophageal reflux disease without esophagitis: Secondary | ICD-10-CM | POA: Diagnosis not present

## 2016-04-27 DIAGNOSIS — E1165 Type 2 diabetes mellitus with hyperglycemia: Secondary | ICD-10-CM | POA: Diagnosis not present

## 2016-04-27 DIAGNOSIS — R296 Repeated falls: Secondary | ICD-10-CM | POA: Diagnosis not present

## 2016-04-28 DIAGNOSIS — F0390 Unspecified dementia without behavioral disturbance: Secondary | ICD-10-CM | POA: Diagnosis not present

## 2016-04-28 DIAGNOSIS — Z7982 Long term (current) use of aspirin: Secondary | ICD-10-CM | POA: Diagnosis not present

## 2016-04-28 DIAGNOSIS — R339 Retention of urine, unspecified: Secondary | ICD-10-CM | POA: Diagnosis not present

## 2016-04-28 DIAGNOSIS — E785 Hyperlipidemia, unspecified: Secondary | ICD-10-CM | POA: Diagnosis not present

## 2016-04-28 DIAGNOSIS — Z7984 Long term (current) use of oral hypoglycemic drugs: Secondary | ICD-10-CM | POA: Diagnosis not present

## 2016-04-28 DIAGNOSIS — Z8673 Personal history of transient ischemic attack (TIA), and cerebral infarction without residual deficits: Secondary | ICD-10-CM | POA: Diagnosis not present

## 2016-04-28 DIAGNOSIS — E1165 Type 2 diabetes mellitus with hyperglycemia: Secondary | ICD-10-CM | POA: Diagnosis not present

## 2016-04-28 DIAGNOSIS — I10 Essential (primary) hypertension: Secondary | ICD-10-CM | POA: Diagnosis not present

## 2016-04-28 DIAGNOSIS — I480 Paroxysmal atrial fibrillation: Secondary | ICD-10-CM | POA: Diagnosis not present

## 2016-04-28 DIAGNOSIS — K219 Gastro-esophageal reflux disease without esophagitis: Secondary | ICD-10-CM | POA: Diagnosis not present

## 2016-04-28 DIAGNOSIS — R296 Repeated falls: Secondary | ICD-10-CM | POA: Diagnosis not present

## 2016-04-29 DIAGNOSIS — N400 Enlarged prostate without lower urinary tract symptoms: Secondary | ICD-10-CM | POA: Diagnosis not present

## 2016-04-29 NOTE — Telephone Encounter (Signed)
Ok to grant verbal orders. 

## 2016-04-29 NOTE — Telephone Encounter (Signed)
VO provider, Ok to grant extension on [facility], understood & agreed/SLS 08/07

## 2016-05-01 DIAGNOSIS — I10 Essential (primary) hypertension: Secondary | ICD-10-CM | POA: Diagnosis not present

## 2016-05-01 DIAGNOSIS — R339 Retention of urine, unspecified: Secondary | ICD-10-CM | POA: Diagnosis not present

## 2016-05-01 DIAGNOSIS — R296 Repeated falls: Secondary | ICD-10-CM | POA: Diagnosis not present

## 2016-05-01 DIAGNOSIS — E1165 Type 2 diabetes mellitus with hyperglycemia: Secondary | ICD-10-CM | POA: Diagnosis not present

## 2016-05-01 DIAGNOSIS — I480 Paroxysmal atrial fibrillation: Secondary | ICD-10-CM | POA: Diagnosis not present

## 2016-05-01 DIAGNOSIS — F0390 Unspecified dementia without behavioral disturbance: Secondary | ICD-10-CM | POA: Diagnosis not present

## 2016-05-02 DIAGNOSIS — F0390 Unspecified dementia without behavioral disturbance: Secondary | ICD-10-CM | POA: Diagnosis not present

## 2016-05-02 DIAGNOSIS — I480 Paroxysmal atrial fibrillation: Secondary | ICD-10-CM | POA: Diagnosis not present

## 2016-05-02 DIAGNOSIS — E1165 Type 2 diabetes mellitus with hyperglycemia: Secondary | ICD-10-CM | POA: Diagnosis not present

## 2016-05-02 DIAGNOSIS — R296 Repeated falls: Secondary | ICD-10-CM | POA: Diagnosis not present

## 2016-05-02 DIAGNOSIS — R339 Retention of urine, unspecified: Secondary | ICD-10-CM | POA: Diagnosis not present

## 2016-05-02 DIAGNOSIS — I10 Essential (primary) hypertension: Secondary | ICD-10-CM | POA: Diagnosis not present

## 2016-05-03 ENCOUNTER — Encounter: Payer: Self-pay | Admitting: Physician Assistant

## 2016-05-03 ENCOUNTER — Ambulatory Visit (INDEPENDENT_AMBULATORY_CARE_PROVIDER_SITE_OTHER): Payer: Medicare Other | Admitting: Physician Assistant

## 2016-05-03 VITALS — BP 90/74 | HR 58 | Temp 97.4°F | Resp 16 | Ht 67.0 in | Wt 162.4 lb

## 2016-05-03 DIAGNOSIS — I480 Paroxysmal atrial fibrillation: Secondary | ICD-10-CM | POA: Diagnosis not present

## 2016-05-03 DIAGNOSIS — N400 Enlarged prostate without lower urinary tract symptoms: Secondary | ICD-10-CM

## 2016-05-03 DIAGNOSIS — R55 Syncope and collapse: Secondary | ICD-10-CM | POA: Diagnosis not present

## 2016-05-03 DIAGNOSIS — N401 Enlarged prostate with lower urinary tract symptoms: Secondary | ICD-10-CM

## 2016-05-03 DIAGNOSIS — R339 Retention of urine, unspecified: Secondary | ICD-10-CM | POA: Diagnosis not present

## 2016-05-03 DIAGNOSIS — R296 Repeated falls: Secondary | ICD-10-CM | POA: Diagnosis not present

## 2016-05-03 DIAGNOSIS — E1165 Type 2 diabetes mellitus with hyperglycemia: Secondary | ICD-10-CM | POA: Diagnosis not present

## 2016-05-03 DIAGNOSIS — F0390 Unspecified dementia without behavioral disturbance: Secondary | ICD-10-CM | POA: Diagnosis not present

## 2016-05-03 DIAGNOSIS — I10 Essential (primary) hypertension: Secondary | ICD-10-CM | POA: Diagnosis not present

## 2016-05-03 DIAGNOSIS — R338 Other retention of urine: Secondary | ICD-10-CM

## 2016-05-03 LAB — CBC
HCT: 35.7 % — ABNORMAL LOW (ref 39.0–52.0)
Hemoglobin: 12.1 g/dL — ABNORMAL LOW (ref 13.0–17.0)
MCHC: 33.9 g/dL (ref 30.0–36.0)
MCV: 86.9 fl (ref 78.0–100.0)
PLATELETS: 259 10*3/uL (ref 150.0–400.0)
RBC: 4.11 Mil/uL — AB (ref 4.22–5.81)
RDW: 14.1 % (ref 11.5–15.5)
WBC: 9.8 10*3/uL (ref 4.0–10.5)

## 2016-05-03 NOTE — Progress Notes (Signed)
Patient presents to clinic today for hospital follow-up of hypotension. Patient was taken to ER on 04/25/16 by EMS after he sustained a syncopal episode. Episode began after taking first dose of Flomax, given by urology for enlarged prostate. Syncopal event was witnessed by patient's wife and sister-in-law. No noted head trauma. ER workup included negative labs, negative UA, negative CXR and CT Head negative for acute event. Patient was stable in ER so discharged home with Urology follow-up.  Since discharge, patient and wife both note that the patient is doing well overall. They have followed up with Urology, where wife notes specialists recommended trial of Rapaflo but patient and wife both declined due to risk of side effects. Patient is now being I/o cathed at home up to QID to help relieve urinary retention. Patient is tolerating well overall. Wife has been trained on cathing and endorses more success with Kudet catheter. Denies hematuria, penile pain. Patient denies lightheadedness, dizziness. Is otherwise feeling good.  Wife is requesting referral to a different specialist. States she is unhappy with care received by current Urologist.   Past Medical History:  Diagnosis Date  . A-fib ([place])   . Cataracts, bilateral   . Diabetes type 2, controlled ([place]) 2000  . History of chicken pox   . Mumps    Adult  . Retinopathy   . Stroke [facility]) Nov 2011  . Undescended testicle, unilateral    Right  . Whooping cough     Current Outpatient Prescriptions on File Prior to Visit  Medication Sig Dispense Refill  . aspirin 81 MG tablet Take 1 tablet (81 mg total) by mouth daily.    . cephALEXin (KEFLEX) 500 MG capsule Take 1 capsule (500 mg total) by mouth 4 (four) times daily. (Patient taking differently: Take 500 mg by mouth daily. At 6:00pm) 40 capsule 0  . clonazePAM (KLONOPIN) 0.5 MG tablet Take 1 tablet (0.5 mg total) by mouth 2 (two) times daily as needed (agitation and insomnia). 60 tablet 1    . cyanocobalamin (,VITAMIN B-12,) 1000 MCG/ML injection Inject 1000 mcg intramuscular daily for 7 days, then once a week for 4 weeks, then once a month for a year 25 mL 0  . Eyelid Cleansers (STERILID EX) Apply topically. Eye Wash: Once Daily    . levothyroxine (SYNTHROID, LEVOTHROID) 50 MCG tablet Take 1 tablet (50 mcg total) by mouth daily. 90 tablet 3  . lisinopril (PRINIVIL,ZESTRIL) 2.5 MG tablet Take 1 tablet (2.5 mg total) by mouth daily. 90 tablet 3  . metFORMIN (GLUCOPHAGE-XR) 500 MG 24 hr tablet Take 2 tablets (1,000 mg total) by mouth daily with breakfast. 180 tablet 3  . metoprolol tartrate (LOPRESSOR) 25 MG tablet Take 0.5 tablets (12.5 mg total) by mouth 2 (two) times daily. 180 tablet 1  . NEEDLE, DISP, 25 G 25G X 1-1/2" MISC 1 each by Does not apply route as directed. Use to draw and inject B12 50 each 0  . Omega-3 Fatty Acids (SB OMEGA-3 FISH OIL) 1000 MG CAPS Take 1 capsule by mouth 2 (two) times daily. Reported on 10/06/2015    . OVER THE COUNTER MEDICATION Take 1 each by mouth 2 (two) times daily. Focus Select Supplement: Vitamin C & E, Zinc & Copper    . Syringe, Disposable, 3 ML MISC 1 each by Does not apply route as directed. 25 each 0  . triamcinolone cream (KENALOG) 0.1 %      No current facility-administered medications on file prior to visit.  Allergies  Allergen Reactions  . Flomax [Tamsulosin Hcl] Other (See Comments)    Hypotension  . Shellfish Allergy Anaphylaxis  . Aspirin-Dipyridamole Er     Unknown per patient  . Other Other (See Comments)    Blood Thinner given in Rehab gave H/As - Not Coumadin     Family History  Problem Relation Age of Onset  . Pneumonia Mother 19    Deceased  . GI Bleed Father 60    Deceased - Ulcers  . Diabetes Cousin   . Diabetes Brother   . Cancer Paternal Aunt     Social History   Social History  . Marital status: Married    Spouse name: N/A  . Number of children: N/A  . Years of education: N/A   Social  History Main Topics  . Smoking status: Former Research scientist (life sciences)  . Smokeless tobacco: Never Used     Comment: Hasn't smoked since age 31  . Alcohol use No  . Drug use: No  . Sexual activity: Not Asked   Other Topics Concern  . None   Social History Narrative   Lives with wife in a one story home.  Has 1 child.  Retired Armed forces technical officer.  Education: Oceanographer.     Review of Systems - See HPI.  All other ROS are negative.  BP 90/74 (BP Location: Right Arm, Patient Position: Sitting)   Pulse (!) 58   Temp 97.4 F (36.3 C) (Oral)   Resp 16   Ht '5\' 7"'  (1.702 m)   Wt 162 lb 6 oz (73.7 kg)   SpO2 96%   BMI 25.43 kg/m   Physical Exam  Constitutional: He is oriented to person, place, and time and well-developed, well-nourished, and in no distress.  HENT:  Head: Normocephalic and atraumatic.  Eyes: Conjunctivae are normal. Pupils are equal, round, and reactive to light.  Neck: Neck supple.  Cardiovascular: Normal rate, regular rhythm, normal heart sounds and intact distal pulses.   Pulmonary/Chest: Effort normal and breath sounds normal. No respiratory distress. He has no wheezes. He has no rales. He exhibits no tenderness.  Neurological: He is alert and oriented to person, place, and time.  Skin: Skin is warm and dry. No rash noted.  Psychiatric: Affect normal.  Vitals reviewed.   Recent Results (from the past 2160 hour(s))  CBC with Differential     Status: Abnormal   Collection Time: 03/25/16  4:02 AM  Result Value Ref Range   WBC 8.4 4.0 - 10.5 K/uL   RBC 4.19 (L) 4.22 - 5.81 MIL/uL   Hemoglobin 12.6 (L) 13.0 - 17.0 g/dL   HCT 36.6 (L) 39.0 - 52.0 %   MCV 87.4 78.0 - 100.0 fL   MCH 30.1 26.0 - 34.0 pg   MCHC 34.4 30.0 - 36.0 g/dL   RDW 13.0 11.5 - 15.5 %   Platelets 209 150 - 400 K/uL   Neutrophils Relative % 45 %   Neutro Abs 3.9 1.7 - 7.7 K/uL   Lymphocytes Relative 35 %   Lymphs Abs 2.9 0.7 - 4.0 K/uL   Monocytes Relative 9 %   Monocytes Absolute 0.7 0.1 - 1.0 K/uL    Eosinophils Relative 10 %   Eosinophils Absolute 0.8 (H) 0.0 - 0.7 K/uL   Basophils Relative 1 %   Basophils Absolute 0.0 0.0 - 0.1 K/uL  Comprehensive metabolic panel     Status: Abnormal   Collection Time: 03/25/16  4:02 AM  Result Value Ref Range  Sodium 136 135 - 145 mmol/L   Potassium 4.3 3.5 - 5.1 mmol/L   Chloride 101 101 - 111 mmol/L   CO2 26 22 - 32 mmol/L   Glucose, Bld 198 (H) 65 - 99 mg/dL   BUN 19 6 - 20 mg/dL   Creatinine, Ser 0.91 0.61 - 1.24 mg/dL   Calcium 9.3 8.9 - 10.3 mg/dL   Total Protein 7.2 6.5 - 8.1 g/dL   Albumin 3.6 3.5 - 5.0 g/dL   AST 16 15 - 41 U/L   ALT 18 17 - 63 U/L   Alkaline Phosphatase 48 38 - 126 U/L   Total Bilirubin 1.0 0.3 - 1.2 mg/dL   GFR calc non Af Amer >60 >60 mL/min   GFR calc Af Amer >60 >60 mL/min    Comment: (NOTE) The eGFR has been calculated using the CKD EPI equation. This calculation has not been validated in all clinical situations. eGFR's persistently <60 mL/min signify possible Chronic Kidney Disease.    Anion gap 9 5 - 15  Troponin I     Status: None   Collection Time: 03/25/16  4:02 AM  Result Value Ref Range   Troponin I <0.03 <0.03 ng/mL  Urinalysis, Routine w reflex microscopic (not at [facility])     Status: Abnormal   Collection Time: 03/25/16  6:00 AM  Result Value Ref Range   Color, Urine YELLOW YELLOW   APPearance CLEAR CLEAR   Specific Gravity, Urine 1.012 1.005 - 1.030   pH 7.0 5.0 - 8.0   Glucose, UA 100 (A) NEGATIVE mg/dL   Hgb urine dipstick NEGATIVE NEGATIVE   Bilirubin Urine NEGATIVE NEGATIVE   Ketones, ur NEGATIVE NEGATIVE mg/dL   Protein, ur NEGATIVE NEGATIVE mg/dL   Nitrite NEGATIVE NEGATIVE   Leukocytes, UA NEGATIVE NEGATIVE    Comment: MICROSCOPIC NOT DONE ON URINES WITH NEGATIVE PROTEIN, BLOOD, LEUKOCYTES, NITRITE, OR GLUCOSE <1000 mg/dL.  Vitamin B12     Status: Abnormal   Collection Time: 03/25/16  4:32 PM  Result Value Ref Range   Vitamin B-12 149 (L) 211 - 911 pg/mL  Sedimentation rate      Status: None   Collection Time: 03/25/16  4:32 PM  Result Value Ref Range   Sed Rate 15 0 - 20 mm/hr  CBG monitoring, ED     Status: Abnormal   Collection Time: 03/27/16  8:33 PM  Result Value Ref Range   Glucose-Capillary 159 (H) 65 - 99 mg/dL  CBC with Differential     Status: Abnormal   Collection Time: 03/27/16  8:35 PM  Result Value Ref Range   WBC 11.8 (H) 4.0 - 10.5 K/uL   RBC 4.01 (L) 4.22 - 5.81 MIL/uL   Hemoglobin 12.1 (L) 13.0 - 17.0 g/dL   HCT 35.2 (L) 39.0 - 52.0 %   MCV 87.8 78.0 - 100.0 fL   MCH 30.2 26.0 - 34.0 pg   MCHC 34.4 30.0 - 36.0 g/dL   RDW 12.9 11.5 - 15.5 %   Platelets 240 150 - 400 K/uL   Neutrophils Relative % 68 %   Neutro Abs 8.0 (H) 1.7 - 7.7 K/uL   Lymphocytes Relative 21 %   Lymphs Abs 2.4 0.7 - 4.0 K/uL   Monocytes Relative 8 %   Monocytes Absolute 1.0 0.1 - 1.0 K/uL   Eosinophils Relative 3 %   Eosinophils Absolute 0.3 0.0 - 0.7 K/uL   Basophils Relative 0 %   Basophils Absolute 0.1 0.0 - 0.1 K/uL  Comprehensive metabolic panel     Status: Abnormal   Collection Time: 03/27/16  8:35 PM  Result Value Ref Range   Sodium 138 135 - 145 mmol/L   Potassium 4.8 3.5 - 5.1 mmol/L   Chloride 103 101 - 111 mmol/L   CO2 25 22 - 32 mmol/L   Glucose, Bld 167 (H) 65 - 99 mg/dL   BUN 19 6 - 20 mg/dL   Creatinine, Ser 1.04 0.61 - 1.24 mg/dL   Calcium 9.3 8.9 - 10.3 mg/dL   Total Protein 6.8 6.5 - 8.1 g/dL   Albumin 3.5 3.5 - 5.0 g/dL   AST 19 15 - 41 U/L   ALT 19 17 - 63 U/L   Alkaline Phosphatase 54 38 - 126 U/L   Total Bilirubin 0.6 0.3 - 1.2 mg/dL   GFR calc non Af Amer >60 >60 mL/min   GFR calc Af Amer >60 >60 mL/min    Comment: (NOTE) The eGFR has been calculated using the CKD EPI equation. This calculation has not been validated in all clinical situations. eGFR's persistently <60 mL/min signify possible Chronic Kidney Disease.    Anion gap 10 5 - 15  Protime-INR     Status: None   Collection Time: 03/27/16  8:35 PM  Result Value Ref  Range   Prothrombin Time 12.9 11.6 - 15.2 seconds   INR 0.95 0.00 - 1.49  Troponin I     Status: None   Collection Time: 03/27/16  8:35 PM  Result Value Ref Range   Troponin I <0.03 <0.03 ng/mL  Urinalysis, Routine w reflex microscopic     Status: Abnormal   Collection Time: 03/27/16  8:38 PM  Result Value Ref Range   Color, Urine YELLOW YELLOW   APPearance TURBID (A) CLEAR   Specific Gravity, Urine 1.015 1.005 - 1.030   pH 6.0 5.0 - 8.0   Glucose, UA 500 (A) NEGATIVE mg/dL   Hgb urine dipstick MODERATE (A) NEGATIVE   Bilirubin Urine NEGATIVE NEGATIVE   Ketones, ur NEGATIVE NEGATIVE mg/dL   Protein, ur NEGATIVE NEGATIVE mg/dL   Nitrite NEGATIVE NEGATIVE   Leukocytes, UA LARGE (A) NEGATIVE  Urine microscopic-add on     Status: Abnormal   Collection Time: 03/27/16  8:38 PM  Result Value Ref Range   Squamous Epithelial / LPF 0-5 (A) NONE SEEN   WBC, UA TOO NUMEROUS TO COUNT 0 - 5 WBC/hpf   RBC / HPF 6-30 0 - 5 RBC/hpf   Bacteria, UA MANY (A) NONE SEEN   Urine-Other MUCOUS PRESENT   Urine culture     Status: Abnormal   Collection Time: 03/27/16  8:38 PM  Result Value Ref Range   Specimen Description URINE, CLEAN CATCH    Special Requests NONE    Culture >=100,000 COLONIES/mL ENTEROCOCCUS SPECIES (A)    Report Status 03/30/2016 FINAL    Organism ID, Bacteria ENTEROCOCCUS SPECIES (A)       Susceptibility   Enterococcus species - MIC*    AMPICILLIN <=2 SENSITIVE Sensitive     LEVOFLOXACIN 1 SENSITIVE Sensitive     NITROFURANTOIN <=16 SENSITIVE Sensitive     VANCOMYCIN 1 SENSITIVE Sensitive     * >=100,000 COLONIES/mL ENTEROCOCCUS SPECIES  Basic metabolic panel     Status: Abnormal   Collection Time: 04/01/16 12:40 PM  Result Value Ref Range   Sodium 133 (L) 135 - 145 mEq/L   Potassium 4.8 3.5 - 5.1 mEq/L   Chloride 98 96 - 112 mEq/L  CO2 27 19 - 32 mEq/L   Glucose, Bld 244 (H) 70 - 99 mg/dL   BUN 19 6 - 23 mg/dL   Creatinine, Ser 1.10 0.40 - 1.50 mg/dL   Calcium 9.8  8.4 - 10.5 mg/dL   GFR 66.78 >60.00 mL/min    Assessment/Plan: 1. Syncope and collapse Secondary to medication -- Flomax. ER assessment unremarkable. No recurrent lightheadedness, dizziness or syncope since stopping Flomax. Vitals are stable overall today. Will check CBC to assess for any worsening anemia of chronic disease. Discussed adequate hydration and food intake.  - CBC  2. BPH (benign prostatic hypertrophy) with urinary retention Referral placed to [facility] for further management. Will continue home cath but will decrease to TID until FU with specialist. as patient having a hard time tolerating the cathing.    Leeanne Rio, PA-C

## 2016-05-03 NOTE — Patient Instructions (Signed)
Please go to the lab for blood work. I will call with results.  Please continue care discussed today. I will reach out to home health to see about RN help. Message me with the type of table you need a prescription for and I will take care of it. Let me know if you have any issue getting the caths from the Urologist.

## 2016-05-07 DIAGNOSIS — R339 Retention of urine, unspecified: Secondary | ICD-10-CM | POA: Diagnosis not present

## 2016-05-07 DIAGNOSIS — I480 Paroxysmal atrial fibrillation: Secondary | ICD-10-CM | POA: Diagnosis not present

## 2016-05-07 DIAGNOSIS — F0390 Unspecified dementia without behavioral disturbance: Secondary | ICD-10-CM | POA: Diagnosis not present

## 2016-05-07 DIAGNOSIS — R296 Repeated falls: Secondary | ICD-10-CM | POA: Diagnosis not present

## 2016-05-07 DIAGNOSIS — I10 Essential (primary) hypertension: Secondary | ICD-10-CM | POA: Diagnosis not present

## 2016-05-07 DIAGNOSIS — E1165 Type 2 diabetes mellitus with hyperglycemia: Secondary | ICD-10-CM | POA: Diagnosis not present

## 2016-05-08 ENCOUNTER — Encounter: Payer: Self-pay | Admitting: Physician Assistant

## 2016-05-08 DIAGNOSIS — E1165 Type 2 diabetes mellitus with hyperglycemia: Secondary | ICD-10-CM | POA: Diagnosis not present

## 2016-05-08 DIAGNOSIS — F0390 Unspecified dementia without behavioral disturbance: Secondary | ICD-10-CM | POA: Diagnosis not present

## 2016-05-08 DIAGNOSIS — I480 Paroxysmal atrial fibrillation: Secondary | ICD-10-CM | POA: Diagnosis not present

## 2016-05-08 DIAGNOSIS — I10 Essential (primary) hypertension: Secondary | ICD-10-CM | POA: Diagnosis not present

## 2016-05-08 DIAGNOSIS — R296 Repeated falls: Secondary | ICD-10-CM | POA: Diagnosis not present

## 2016-05-08 DIAGNOSIS — R339 Retention of urine, unspecified: Secondary | ICD-10-CM | POA: Diagnosis not present

## 2016-05-09 ENCOUNTER — Encounter: Payer: Self-pay | Admitting: Physician Assistant

## 2016-05-09 DIAGNOSIS — L299 Pruritus, unspecified: Secondary | ICD-10-CM | POA: Diagnosis not present

## 2016-05-09 DIAGNOSIS — I10 Essential (primary) hypertension: Secondary | ICD-10-CM | POA: Diagnosis not present

## 2016-05-09 DIAGNOSIS — I480 Paroxysmal atrial fibrillation: Secondary | ICD-10-CM | POA: Diagnosis not present

## 2016-05-09 DIAGNOSIS — L308 Other specified dermatitis: Secondary | ICD-10-CM | POA: Diagnosis not present

## 2016-05-09 DIAGNOSIS — R296 Repeated falls: Secondary | ICD-10-CM | POA: Diagnosis not present

## 2016-05-09 DIAGNOSIS — D485 Neoplasm of uncertain behavior of skin: Secondary | ICD-10-CM | POA: Diagnosis not present

## 2016-05-09 DIAGNOSIS — R339 Retention of urine, unspecified: Secondary | ICD-10-CM | POA: Diagnosis not present

## 2016-05-09 DIAGNOSIS — R21 Rash and other nonspecific skin eruption: Secondary | ICD-10-CM | POA: Diagnosis not present

## 2016-05-09 DIAGNOSIS — F0390 Unspecified dementia without behavioral disturbance: Secondary | ICD-10-CM | POA: Diagnosis not present

## 2016-05-09 DIAGNOSIS — E1165 Type 2 diabetes mellitus with hyperglycemia: Secondary | ICD-10-CM | POA: Diagnosis not present

## 2016-05-14 DIAGNOSIS — I10 Essential (primary) hypertension: Secondary | ICD-10-CM | POA: Diagnosis not present

## 2016-05-14 DIAGNOSIS — I480 Paroxysmal atrial fibrillation: Secondary | ICD-10-CM | POA: Diagnosis not present

## 2016-05-14 DIAGNOSIS — F0390 Unspecified dementia without behavioral disturbance: Secondary | ICD-10-CM | POA: Diagnosis not present

## 2016-05-14 DIAGNOSIS — R296 Repeated falls: Secondary | ICD-10-CM | POA: Diagnosis not present

## 2016-05-14 DIAGNOSIS — R339 Retention of urine, unspecified: Secondary | ICD-10-CM | POA: Diagnosis not present

## 2016-05-14 DIAGNOSIS — E1165 Type 2 diabetes mellitus with hyperglycemia: Secondary | ICD-10-CM | POA: Diagnosis not present

## 2016-05-16 DIAGNOSIS — I10 Essential (primary) hypertension: Secondary | ICD-10-CM | POA: Diagnosis not present

## 2016-05-16 DIAGNOSIS — R339 Retention of urine, unspecified: Secondary | ICD-10-CM | POA: Diagnosis not present

## 2016-05-16 DIAGNOSIS — I480 Paroxysmal atrial fibrillation: Secondary | ICD-10-CM | POA: Diagnosis not present

## 2016-05-16 DIAGNOSIS — R296 Repeated falls: Secondary | ICD-10-CM | POA: Diagnosis not present

## 2016-05-16 DIAGNOSIS — E1165 Type 2 diabetes mellitus with hyperglycemia: Secondary | ICD-10-CM | POA: Diagnosis not present

## 2016-05-16 DIAGNOSIS — F0390 Unspecified dementia without behavioral disturbance: Secondary | ICD-10-CM | POA: Diagnosis not present

## 2016-05-17 DIAGNOSIS — R296 Repeated falls: Secondary | ICD-10-CM | POA: Diagnosis not present

## 2016-05-17 DIAGNOSIS — E1165 Type 2 diabetes mellitus with hyperglycemia: Secondary | ICD-10-CM | POA: Diagnosis not present

## 2016-05-17 DIAGNOSIS — I480 Paroxysmal atrial fibrillation: Secondary | ICD-10-CM | POA: Diagnosis not present

## 2016-05-17 DIAGNOSIS — R339 Retention of urine, unspecified: Secondary | ICD-10-CM | POA: Diagnosis not present

## 2016-05-17 DIAGNOSIS — F0390 Unspecified dementia without behavioral disturbance: Secondary | ICD-10-CM | POA: Diagnosis not present

## 2016-05-17 DIAGNOSIS — I10 Essential (primary) hypertension: Secondary | ICD-10-CM | POA: Diagnosis not present

## 2016-05-17 NOTE — Telephone Encounter (Signed)
[facility] calling regarding B 12 lab draw, they have not heard from our ofc, please call Lady Saucier RN [phone] Fax [phone]

## 2016-05-17 NOTE — Telephone Encounter (Signed)
Called Amy Vogler. Verbal orders for b12 draw left with RN.  I will fax over written orders.

## 2016-05-20 ENCOUNTER — Telehealth: Payer: Self-pay | Admitting: Neurology

## 2016-05-20 NOTE — Telephone Encounter (Signed)
VM-PT's daughter called and left a message and wanted to know about some testing results she needed for another appointment/Dawn CB# [phone] or [phone]

## 2016-05-20 NOTE — Telephone Encounter (Signed)
Spoke with patient's wife and she wanted to know if Dr. Georgie Chard office note would be sufficient to show his MMSE. Made aware it is documented in his note so that should be fine.   She wants Dr. Georgie Chard opinion and to see if he has heard of Dr. Virgina Organ in [place] who is doing "functional medicine" to treat memory loss. They are thinking of pursuing this. She was advised Dr. Tomi Likens is not in the office this week but I would pass the message along and get back to her if he has any thoughts.

## 2016-05-21 ENCOUNTER — Encounter: Payer: Self-pay | Admitting: Physician Assistant

## 2016-05-21 ENCOUNTER — Telehealth: Payer: Self-pay

## 2016-05-21 DIAGNOSIS — K219 Gastro-esophageal reflux disease without esophagitis: Secondary | ICD-10-CM | POA: Diagnosis not present

## 2016-05-21 DIAGNOSIS — I10 Essential (primary) hypertension: Secondary | ICD-10-CM | POA: Diagnosis not present

## 2016-05-21 DIAGNOSIS — Z8673 Personal history of transient ischemic attack (TIA), and cerebral infarction without residual deficits: Secondary | ICD-10-CM | POA: Diagnosis not present

## 2016-05-21 DIAGNOSIS — R339 Retention of urine, unspecified: Secondary | ICD-10-CM | POA: Diagnosis not present

## 2016-05-21 DIAGNOSIS — I480 Paroxysmal atrial fibrillation: Secondary | ICD-10-CM | POA: Diagnosis not present

## 2016-05-21 DIAGNOSIS — R296 Repeated falls: Secondary | ICD-10-CM | POA: Diagnosis not present

## 2016-05-21 DIAGNOSIS — E1165 Type 2 diabetes mellitus with hyperglycemia: Secondary | ICD-10-CM | POA: Diagnosis not present

## 2016-05-21 DIAGNOSIS — F0390 Unspecified dementia without behavioral disturbance: Secondary | ICD-10-CM | POA: Diagnosis not present

## 2016-05-21 NOTE — Telephone Encounter (Signed)
Received order via fax for the following:  SN to obtain B12 level via venipuncture during week of 05/20/16, with results to Leeanne Rio, PA-C.   Order numbered and placed in Martin's red folder for review and signature.

## 2016-05-23 ENCOUNTER — Telehealth: Payer: Self-pay | Admitting: *Deleted

## 2016-05-23 DIAGNOSIS — L2084 Intrinsic (allergic) eczema: Secondary | ICD-10-CM | POA: Diagnosis not present

## 2016-05-23 DIAGNOSIS — L299 Pruritus, unspecified: Secondary | ICD-10-CM | POA: Diagnosis not present

## 2016-05-23 NOTE — Telephone Encounter (Signed)
Received Order and Plan of Care via fax from [place].  Placed in folder for Dr. Charlett Blake to review and sign.//AB/CMA

## 2016-05-24 ENCOUNTER — Encounter: Payer: Self-pay | Admitting: Physician Assistant

## 2016-05-27 NOTE — Telephone Encounter (Signed)
I cannot really comment about Dr. Virgina Organ, as I'm not too familiar with his work and it's also experimental.

## 2016-05-28 ENCOUNTER — Telehealth: Payer: Self-pay | Admitting: Physician Assistant

## 2016-05-28 DIAGNOSIS — F0390 Unspecified dementia without behavioral disturbance: Secondary | ICD-10-CM | POA: Diagnosis not present

## 2016-05-28 DIAGNOSIS — I480 Paroxysmal atrial fibrillation: Secondary | ICD-10-CM | POA: Diagnosis not present

## 2016-05-28 DIAGNOSIS — L309 Dermatitis, unspecified: Secondary | ICD-10-CM | POA: Diagnosis not present

## 2016-05-28 DIAGNOSIS — I10 Essential (primary) hypertension: Secondary | ICD-10-CM | POA: Diagnosis not present

## 2016-05-28 DIAGNOSIS — R339 Retention of urine, unspecified: Secondary | ICD-10-CM | POA: Diagnosis not present

## 2016-05-28 DIAGNOSIS — R296 Repeated falls: Secondary | ICD-10-CM | POA: Diagnosis not present

## 2016-05-28 DIAGNOSIS — E1165 Type 2 diabetes mellitus with hyperglycemia: Secondary | ICD-10-CM | POA: Diagnosis not present

## 2016-05-28 NOTE — Telephone Encounter (Signed)
Caller name:Criaig from [facility] Relationship to patient: Can be reached:[phone] Pharmacy:  Reason for call:requesting orders to continue PT, would like to do 1 more time this week and then 2x a week for 4 weeks.

## 2016-05-28 NOTE — Telephone Encounter (Signed)
Caller informed, understood & agreed/SLS 09/05

## 2016-05-28 NOTE — Telephone Encounter (Signed)
Ok to give verbal order. They can fax written orders to our office

## 2016-05-28 NOTE — Telephone Encounter (Signed)
Results were left on pt's voicemail, with instructions to call back with any questions or concerns in relation to results.   

## 2016-05-29 ENCOUNTER — Encounter: Payer: Self-pay | Admitting: Physician Assistant

## 2016-05-29 DIAGNOSIS — I10 Essential (primary) hypertension: Secondary | ICD-10-CM | POA: Diagnosis not present

## 2016-05-29 DIAGNOSIS — E1165 Type 2 diabetes mellitus with hyperglycemia: Secondary | ICD-10-CM | POA: Diagnosis not present

## 2016-05-29 DIAGNOSIS — R339 Retention of urine, unspecified: Secondary | ICD-10-CM | POA: Diagnosis not present

## 2016-05-29 DIAGNOSIS — I480 Paroxysmal atrial fibrillation: Secondary | ICD-10-CM | POA: Diagnosis not present

## 2016-05-29 DIAGNOSIS — F0390 Unspecified dementia without behavioral disturbance: Secondary | ICD-10-CM | POA: Diagnosis not present

## 2016-05-29 DIAGNOSIS — R296 Repeated falls: Secondary | ICD-10-CM | POA: Diagnosis not present

## 2016-05-30 ENCOUNTER — Emergency Department (HOSPITAL_BASED_OUTPATIENT_CLINIC_OR_DEPARTMENT_OTHER): Payer: Medicare Other

## 2016-05-30 ENCOUNTER — Encounter (HOSPITAL_BASED_OUTPATIENT_CLINIC_OR_DEPARTMENT_OTHER): Payer: Self-pay | Admitting: *Deleted

## 2016-05-30 ENCOUNTER — Telehealth: Payer: Self-pay | Admitting: Physician Assistant

## 2016-05-30 ENCOUNTER — Emergency Department (HOSPITAL_BASED_OUTPATIENT_CLINIC_OR_DEPARTMENT_OTHER)
Admission: EM | Admit: 2016-05-30 | Discharge: 2016-05-30 | Disposition: A | Discharge status: Home / Self Care | Payer: Medicare Other | Attending: Emergency Medicine | Admitting: Emergency Medicine

## 2016-05-30 DIAGNOSIS — R4781 Slurred speech: Secondary | ICD-10-CM | POA: Diagnosis not present

## 2016-05-30 DIAGNOSIS — Z7984 Long term (current) use of oral hypoglycemic drugs: Secondary | ICD-10-CM | POA: Diagnosis not present

## 2016-05-30 DIAGNOSIS — Z87891 Personal history of nicotine dependence: Secondary | ICD-10-CM | POA: Diagnosis not present

## 2016-05-30 DIAGNOSIS — E1165 Type 2 diabetes mellitus with hyperglycemia: Secondary | ICD-10-CM | POA: Diagnosis not present

## 2016-05-30 DIAGNOSIS — R739 Hyperglycemia, unspecified: Secondary | ICD-10-CM

## 2016-05-30 DIAGNOSIS — R200 Anesthesia of skin: Secondary | ICD-10-CM | POA: Insufficient documentation

## 2016-05-30 DIAGNOSIS — Z7982 Long term (current) use of aspirin: Secondary | ICD-10-CM | POA: Diagnosis not present

## 2016-05-30 DIAGNOSIS — R531 Weakness: Secondary | ICD-10-CM | POA: Diagnosis present

## 2016-05-30 LAB — CBC WITH DIFFERENTIAL/PLATELET
BASOS ABS: 0.1 10*3/uL (ref 0.0–0.1)
BASOS PCT: 1 %
EOS ABS: 0.9 10*3/uL — AB (ref 0.0–0.7)
EOS PCT: 11 %
HCT: 38.1 % — ABNORMAL LOW (ref 39.0–52.0)
Hemoglobin: 13.5 g/dL (ref 13.0–17.0)
Lymphocytes Relative: 31 %
Lymphs Abs: 2.7 10*3/uL (ref 0.7–4.0)
MCH: 30 pg (ref 26.0–34.0)
MCHC: 35.4 g/dL (ref 30.0–36.0)
MCV: 84.7 fL (ref 78.0–100.0)
MONO ABS: 0.7 10*3/uL (ref 0.1–1.0)
MONOS PCT: 8 %
Neutro Abs: 4.2 10*3/uL (ref 1.7–7.7)
Neutrophils Relative %: 49 %
PLATELETS: 201 10*3/uL (ref 150–400)
RBC: 4.5 MIL/uL (ref 4.22–5.81)
RDW: 12.7 % (ref 11.5–15.5)
WBC: 8.6 10*3/uL (ref 4.0–10.5)

## 2016-05-30 LAB — URINE MICROSCOPIC-ADD ON
BACTERIA UA: NONE SEEN
SQUAMOUS EPITHELIAL / LPF: NONE SEEN

## 2016-05-30 LAB — URINALYSIS, ROUTINE W REFLEX MICROSCOPIC
BILIRUBIN URINE: NEGATIVE
Glucose, UA: >1000 mg/dL — AB
HGB URINE DIPSTICK: NEGATIVE
KETONES UR: NEGATIVE mg/dL
Leukocytes, UA: NEGATIVE
NITRITE: NEGATIVE
Protein, ur: NEGATIVE mg/dL
Specific Gravity, Urine: 1.017 (ref 1.005–1.030)
pH: 6.5 (ref 5.0–8.0)

## 2016-05-30 LAB — BASIC METABOLIC PANEL
ANION GAP: 8 (ref 5–15)
BUN: 19 mg/dL (ref 6–20)
CALCIUM: 9.4 mg/dL (ref 8.9–10.3)
CO2: 26 mmol/L (ref 22–32)
CREATININE: 0.98 mg/dL (ref 0.61–1.24)
Chloride: 96 mmol/L — ABNORMAL LOW (ref 101–111)
GFR calc Af Amer: >60 mL/min (ref >60)
GLUCOSE: 386 mg/dL — AB (ref 65–99)
Potassium: 4.3 mmol/L (ref 3.5–5.1)
Sodium: 130 mmol/L — ABNORMAL LOW (ref 135–145)

## 2016-05-30 LAB — TROPONIN I: Troponin I: <0.03 ng/mL (ref <0.03)

## 2016-05-30 LAB — CBG MONITORING, ED
GLUCOSE-CAPILLARY: 378 mg/dL — AB (ref 65–99)
Glucose-Capillary: 267 mg/dL — ABNORMAL HIGH (ref 65–99)

## 2016-05-30 MED ORDER — SODIUM CHLORIDE 0.9 % IV BOLUS (SEPSIS)
500.0000 mL | Freq: Once | INTRAVENOUS | Status: AC
  Administered 2016-05-30: 500 mL via INTRAVENOUS

## 2016-05-30 MED ORDER — INSULIN ASPART 100 UNIT/ML IV SOLN
5.0000 [IU] | Freq: Once | INTRAVENOUS | Status: AC
  Administered 2016-05-30: 5 [IU] via INTRAVENOUS
  Filled 2016-05-30: qty 1

## 2016-05-30 NOTE — Telephone Encounter (Signed)
Spoke to [place] (Patient Care Manager) from [place].  States she received a call regarding critical lab results on patient.  Yesterday, pt's blood sugar was 441 non-fasting.  Pt is on metformin, but patient/family does not check blood sugar in the home.  Darrick Penna also reported that today, per daughters in the home, pt seemed to be having mild stroke like symptoms upon waking up today.  VS have not been checked.  Darrick Penna says family was advised to take patient to the ER.  Family was hesitant stating they were unsure if he was truly having a mild stroke.  Shelly called to report to provider.  Pt has hx. paroxysmal atrial fib, DM Type 2, hyperlipidemia, completed stroke.    Darrick Penna was advised that patient should be taken to ER for elevated BS and mild stroke like symptoms.  Shelly agreed to call family back and strongly advised that patient be seen in the ER.    Pt's provider is out of the office today.  Message routed to [employer], PA-C (Doc of the Day) and Elyn Aquas, PA-C for [employer].

## 2016-05-30 NOTE — Telephone Encounter (Signed)
Gerald Holt is aware and agrees with plan.

## 2016-05-30 NOTE — Discharge Instructions (Signed)
Check blood sugars every 6 hours for the next several days and keep a record of this.  Take this with you to your next doctor's appointment, ideally this should occur next week.  You are welcome to return to the emergency department at any time should you desire hospitalization as recommended during this visit.

## 2016-05-30 NOTE — Telephone Encounter (Signed)
[facility]) called stating that one of her CNA's was at the patient's home and the patient had a BS of over 400. Transferred call to [employer]

## 2016-05-30 NOTE — Progress Notes (Signed)
TX from [facility]  82 year old male with a history of diabetes, previous strokes, presents to the ER with a CBG of greater than 400, altered mental status, slurred speech, difficulty walking. Patient complains of dizziness and being off balance Patient is being transferred to [facility] for stroke workup and stabilization of his CBG Vitals stable, accepted to telemetry.

## 2016-05-30 NOTE — Telephone Encounter (Signed)
Saw this message while reviewing my in-basket at home.  Spoke with patient's wife. Had stroke like symptoms this AM - dizziness, off-balance, AMS that per here lasted < 1 hour but he has significant fatigue. She verifies sugar was significantly elevated yesterday. I am not sure why the results were not called in from home health yesterday when they were obtained. She has been again informed to take him to the ER for assessment, imaging and repeat glucose testing. He is taking medications as directed and BS has been stable with A1C at 6.2 previously. This is new. She is taking him to the ER now with aid of her sister.

## 2016-05-30 NOTE — ED Notes (Signed)
MD at bedside. 

## 2016-05-30 NOTE — ED Provider Notes (Signed)
[facility] Provider Note   CSN: [id number] Arrival date & time: 05/30/16  1219     History   Chief Complaint Chief Complaint  Patient presents with  . Aphasia    HPI Gerald Holt is a 82 y.o. male.  Patient is a 82 year old male with past medical history of diabetes, prior CVA. He presents for evaluation of weakness. According to his family member at bedside, he was awakened this morning and was noted to have slurred speech and numbness in his fingers. Prior to this, he was last seen at 9 PM yesterday in his normal state of health. The family member checked his blood sugar at home and it was over 400. He seemed to have increased difficulty walking, and was recommended to be seen here by his primary doctor. The patient denies to me he is experiencing any chest pain, difficulty breathing. He does report feeling "foggy in the head".   The history is provided by the patient.    Past Medical History:  Diagnosis Date  . A-fib ([place])   . Cataracts, bilateral   . Diabetes type 2, controlled ([place]) 2000  . History of chicken pox   . Mumps    Adult  . Retinopathy   . Stroke [facility]) Nov 2011  . Undescended testicle, unilateral    Right  . Whooping cough     Patient Active Problem List   Diagnosis Date Noted  . Leukocytosis 07/18/2015  . Gastroesophageal reflux disease without esophagitis 07/09/2015  . Elevated TSH 07/09/2015  . Falls 06/22/2015  . Diabetes mellitus type II, uncontrolled ([place]) 05/29/2015  . Paroxysmal atrial fibrillation ([place]) 05/29/2015  . Hyperlipidemia 05/29/2015  . Hx of completed stroke 05/29/2015    Past Surgical History:  Procedure Laterality Date  . PRE-MALIGNANT / BENIGN SKIN LESION EXCISION    . TESTICLE SURGERY     Right, undescended  . TOOTH EXTRACTION         Home Medications    Prior to Admission medications   Medication Sig Start Date End Date Taking? Authorizing Provider  aspirin 81 MG tablet Take 1 tablet (81 mg total)  by mouth daily. 06/22/15   Jerline Pain, MD  cephALEXin (KEFLEX) 500 MG capsule Take 1 capsule (500 mg total) by mouth 4 (four) times daily. Patient taking differently: Take 500 mg by mouth daily. At 6:00pm 03/27/16   Isla Pence, MD  clonazePAM (KLONOPIN) 0.5 MG tablet Take 1 tablet (0.5 mg total) by mouth 2 (two) times daily as needed (agitation and insomnia). 04/01/16   [name], PA-C  cyanocobalamin (,VITAMIN B-12,) 1000 MCG/ML injection Inject 1000 mcg intramuscular daily for 7 days, then once a week for 4 weeks, then once a month for a year 04/02/16   Pieter Partridge, DO  diphenhydrAMINE (BENADRYL) 25 mg capsule Take 25 mg by mouth every 6 (six) hours as needed for itching.    Historical Provider, MD  docusate sodium (COLACE) 100 MG capsule Take 100 mg by mouth.    Historical Provider, MD  Eyelid Cleansers (STERILID EX) Apply topically. Eye Wash: Once Daily    Historical Provider, MD  levothyroxine (SYNTHROID, LEVOTHROID) 50 MCG tablet Take 1 tablet (50 mcg total) by mouth daily. 04/09/16   [name], PA-C  lisinopril (PRINIVIL,ZESTRIL) 2.5 MG tablet Take 1 tablet (2.5 mg total) by mouth daily. 04/09/16   [name], PA-C  metFORMIN (GLUCOPHAGE-XR) 500 MG 24 hr tablet Take 2 tablets (1,000 mg total) by mouth daily  with breakfast. 04/09/16   [name], PA-C  metoprolol tartrate (LOPRESSOR) 25 MG tablet Take 0.5 tablets (12.5 mg total) by mouth 2 (two) times daily. 04/09/16   [name], PA-C  NEEDLE, DISP, 25 G 25G X 1-1/2" MISC 1 each by Does not apply route as directed. Use to draw and inject B12 04/02/16   Pieter Partridge, DO  Omega-3 Fatty Acids (SB OMEGA-3 FISH OIL) 1000 MG CAPS Take 1 capsule by mouth 2 (two) times daily. Reported on 10/06/2015    Historical Provider, MD  OVER THE COUNTER MEDICATION Take 1 each by mouth 2 (two) times daily. Focus Select Supplement: Vitamin C & E, Zinc & Copper    Historical Provider, MD  Syringe, Disposable, 3 ML MISC 1 each by Does not  apply route as directed. 04/02/16   Pieter Partridge, DO  triamcinolone cream (KENALOG) 0.1 %  02/01/16   Historical Provider, MD    Family History Family History  Problem Relation Age of Onset  . Pneumonia Mother 68    Deceased  . GI Bleed Father 40    Deceased - Ulcers  . Diabetes Cousin   . Diabetes Brother   . Cancer Paternal Aunt     Social History Social History  Substance Use Topics  . Smoking status: Former Research scientist (life sciences)  . Smokeless tobacco: Never Used     Comment: Hasn't smoked since age 22  . Alcohol use No     Allergies   Flomax [tamsulosin hcl]; Shellfish allergy; Aspirin-dipyridamole er; and Other   Review of Systems Review of Systems  All other systems reviewed and are negative.    Physical Exam Updated Vital Signs BP 151/75   Pulse 60   Temp 97.6 F (36.4 C) (Rectal) Comment: assisted by RN Hazel  Resp 18   Ht _0  (1.702 m)   Wt 162 lb (73.5 kg)   SpO2 99%   BMI 25.37 kg/m   Physical Exam  Constitutional: He is oriented to person, place, and time. He appears well-developed and well-nourished. No distress.  HENT:  Head: Normocephalic and atraumatic.  Mouth/Throat: Oropharynx is clear and moist.  Neck: Normal range of motion. Neck supple.  Cardiovascular: Normal rate and regular rhythm.  Exam reveals no friction rub.   No murmur heard. Pulmonary/Chest: Effort normal and breath sounds normal. No respiratory distress. He has no wheezes. He has no rales.  Abdominal: Soft. Bowel sounds are normal. He exhibits no distension. There is no tenderness.  Musculoskeletal: Normal range of motion. He exhibits no edema.  Neurological: He is alert and oriented to person, place, and time. No cranial nerve deficit. He exhibits normal muscle tone. Coordination normal.  Cranial nerves appear intact, however he does have some slurring of the speech which is family member describes as new for him.  Skin: Skin is warm and dry. He is not diaphoretic.  Nursing note and  vitals reviewed.    ED Treatments / Results  Labs (all labs ordered are listed, but only abnormal results are displayed) Labs Reviewed  BASIC METABOLIC PANEL - Abnormal; Notable for the following:       Result Value   Sodium 130 (*)    Chloride 96 (*)    Glucose, Bld 386 (*)    All other components within normal limits  CBC WITH DIFFERENTIAL/PLATELET - Abnormal; Notable for the following:    HCT 38.1 (*)    Eosinophils Absolute 0.9 (*)    All other components within normal  limits  CBG MONITORING, ED - Abnormal; Notable for the following:    Glucose-Capillary 378 (*)    All other components within normal limits  TROPONIN I  URINALYSIS, ROUTINE W REFLEX MICROSCOPIC (NOT AT [facility])    EKG  EKG Interpretation  Date/Time:  Thursday May 30 2016 12:31:44 EDT Ventricular Rate:  60 PR Interval:    QRS Duration: 145 QT Interval:  463 QTC Calculation: 463 R Axis:   77 Text Interpretation:  Sinus rhythm Right bundle branch block Confirmed by Brendin Situ  MD, Luisfernando Brightwell ([id number]) on 05/30/2016 12:47:19 PM       Radiology No results found.  Procedures Procedures (including critical care time)  Medications Ordered in ED Medications  sodium chloride 0.9 % bolus 500 mL (not administered)     Initial Impression / Assessment and Plan / ED Course  I have reviewed the triage vital signs and the nursing notes.  Pertinent labs & imaging results that were available during my care of the patient were reviewed by me and considered in my medical decision making (see chart for details).  Clinical Course    Patient presents here with complaints of feeling "foggy in the head", left fingertip numbness, slurred speech, and generalized weakness. These symptoms were concerning for a possible stroke. He underwent a head CT which was negative, however symptoms persist. It is my opinion that he should have an MRI to further evaluate for possible stroke, and management of his elevated blood sugar. My  plan was to have him admitted for workup of both of these issues.  I expressed my concerns to the patient and his wife who is present at bedside. The wife expressed extreme concern about him being admitted to the hospital, stating that his recent stay at [facility] was less than ideal. She reports that he became agitated and confused and she did not feel as though his basic needs were being met. She is extremely reluctant for him to be readmitted.  I explained the risks and benefits of both admission and return home, and the wife feels as though he will get better care at home than he would in the hospital. They are declining admission with a full understanding of the above risks and benefits. She is requesting a glucometer to monitor his blood sugars, and this will be prescribed.   The patient and wife understand that they are welcome to return to the ED if they desire completion of the workup.  Final Clinical Impressions(s) / ED Diagnoses   Final diagnoses:  None    New Prescriptions New Prescriptions   No medications on file     Veryl Speak, MD 05/30/16 1545

## 2016-05-30 NOTE — ED Triage Notes (Signed)
Woke with slurred speech and difficulty walking. He was last seen normal at 9pm by his wife. Last night his BS was over 400.  He is alert. He moves from the w/c to the stretcher with minimal assistance. Hx of stroke in the past.

## 2016-05-31 ENCOUNTER — Encounter: Payer: Self-pay | Admitting: Physician Assistant

## 2016-06-01 ENCOUNTER — Encounter: Payer: Self-pay | Admitting: Physician Assistant

## 2016-06-03 ENCOUNTER — Other Ambulatory Visit: Payer: Self-pay | Admitting: Family Medicine

## 2016-06-03 ENCOUNTER — Other Ambulatory Visit: Payer: Self-pay | Admitting: General Practice

## 2016-06-03 DIAGNOSIS — I480 Paroxysmal atrial fibrillation: Secondary | ICD-10-CM | POA: Diagnosis not present

## 2016-06-03 DIAGNOSIS — R339 Retention of urine, unspecified: Secondary | ICD-10-CM | POA: Diagnosis not present

## 2016-06-03 DIAGNOSIS — F0390 Unspecified dementia without behavioral disturbance: Secondary | ICD-10-CM | POA: Diagnosis not present

## 2016-06-03 DIAGNOSIS — E1165 Type 2 diabetes mellitus with hyperglycemia: Secondary | ICD-10-CM | POA: Diagnosis not present

## 2016-06-03 DIAGNOSIS — I10 Essential (primary) hypertension: Secondary | ICD-10-CM | POA: Diagnosis not present

## 2016-06-03 DIAGNOSIS — R296 Repeated falls: Secondary | ICD-10-CM | POA: Diagnosis not present

## 2016-06-04 ENCOUNTER — Encounter: Payer: Self-pay | Admitting: Physician Assistant

## 2016-06-04 ENCOUNTER — Ambulatory Visit (INDEPENDENT_AMBULATORY_CARE_PROVIDER_SITE_OTHER): Payer: Medicare Other | Admitting: Physician Assistant

## 2016-06-04 VITALS — BP 118/66 | HR 68 | Temp 97.6°F | Resp 16 | Ht 67.0 in | Wt 161.1 lb

## 2016-06-04 DIAGNOSIS — R739 Hyperglycemia, unspecified: Secondary | ICD-10-CM

## 2016-06-04 DIAGNOSIS — R2990 Unspecified symptoms and signs involving the nervous system: Secondary | ICD-10-CM | POA: Diagnosis not present

## 2016-06-04 LAB — POCT GLUCOSE (DEVICE FOR HOME USE): POC GLUCOSE: 318 mg/dL — AB (ref 70–99)

## 2016-06-04 MED ORDER — METFORMIN HCL ER 500 MG PO TB24: 1000.0000 mg | ORAL_TABLET | Freq: Every day | ORAL | 3 refills | Status: DC

## 2016-06-04 NOTE — Progress Notes (Signed)
Patient presents to clinic today for ER follow-up of hyperglycemia and stroke-like symptoms. Patient was taken to ER after home health noted sugar level in the mid 400s. Wife also noted patient with dysphasia and complaints of weakness. Presented to ER on 05/30/16. CT Head obtained due to stoke-like symptoms. Was negative. CBGs elevated at 386. Admission recommended for MRI and further assessment/management but patient and family declined as patient's symptoms resolved during visit. Wife requested Glucometer to check sugars at home and promised to return to ER if symptoms recurred.   Since discharge, patient states he has been doing well. Denies any recurrent symptoms. Denies slurred speech, focal weakness or difficulty with ambulation above baseline. Is not checking sugars as directed. Patient is a Type II diabetic, only on Metformin XR 533m daily due to A1C being stable on low dose (5.2 and then 6.3 on lower dose). Wife endorses she feels elevated blood sugars are due to intake of ice cream (states pt is eating about 1 pint per day) and lemonade intake. Is not eating much protein.  Patient has appointment with Neurology next week with MRI brain scheduled for this weekend.  Patient is taking 81 mg ASA daily. Is taking BP medications as directed. Patient denies chest pain, palpitations, lightheadedness, dizziness, vision changes or frequent headaches.  BP Readings from Last 3 Encounters:  06/04/16 118/66  05/30/16 148/85  05/03/16 90/74   Past Medical History:  Diagnosis Date  [place]   . Cataracts, bilateral   . Diabetes type 2, controlled ([place]  . History of chicken pox   . Mumps    Adult  . Retinopathy   . Stroke ([facility] Nov 2011  . Undescended testicle, unilateral    Right  . Whooping cough     Current Outpatient Prescriptions on File Prior to Visit  Medication Sig Dispense Refill  . aspirin 81 MG tablet Take 1 tablet (81 mg total) by mouth daily.    . clonazePAM (KLONOPIN)  0.5 MG tablet Take 1 tablet (0.5 mg total) by mouth 2 (two) times daily as needed (agitation and insomnia). 60 tablet 1  . cyanocobalamin (,VITAMIN B-12,) 1000 MCG/ML injection Inject 1000 mcg intramuscular daily for 7 days, then once a week for 4 weeks, then once a month for a year (Patient taking differently: every 30 (thirty) days. Inject 1000 mcg intramuscular daily for 7 days, then once a week for 4 weeks, then once a month for a year) 25 mL 0  . diphenhydrAMINE (BENADRYL) 25 mg capsule Take 25 mg by mouth every 6 (six) hours as needed for itching.    . docusate sodium (COLACE) 100 MG capsule Take 200 mg by mouth.     . Eyelid Cleansers (STERILID EX) Apply topically. Eye Wash: Once Daily    . levothyroxine (SYNTHROID, LEVOTHROID) 50 MCG tablet Take 1 tablet (50 mcg total) by mouth daily. 90 tablet 3  . lisinopril (PRINIVIL,ZESTRIL) 2.5 MG tablet Take 1 tablet (2.5 mg total) by mouth daily. 90 tablet 3  . metoprolol tartrate (LOPRESSOR) 25 MG tablet Take 0.5 tablets (12.5 mg total) by mouth 2 (two) times daily. 180 tablet 1  . NEEDLE, DISP, 25 G 25G X 1-1/2" MISC 1 each by Does not apply route as directed. Use to draw and inject B12 50 each 0  . Omega-3 Fatty Acids (SB OMEGA-3 FISH OIL) 1000 MG CAPS Take 1 capsule by mouth 2 (two) times daily. Reported on 10/06/2015    . OVER THE COUNTER MEDICATION  Take 1 each by mouth 2 (two) times daily. Focus Select Supplement: Vitamin C & E, Zinc & Copper    . Syringe, Disposable, 3 ML MISC 1 each by Does not apply route as directed. 25 each 0  . triamcinolone cream (KENALOG) 0.1 %      No current facility-administered medications on file prior to visit.     Allergies  Allergen Reactions  . Flomax [Tamsulosin Hcl] Other (See Comments)    Hypotension  . Shellfish Allergy Anaphylaxis  . Aspirin-Dipyridamole Er     Unknown per patient  . Other Other (See Comments)    Blood Thinner given in Rehab gave H/As - Not Coumadin     Family History  Problem  Relation Age of Onset  . Pneumonia Mother 22    Deceased  . GI Bleed Father 51    Deceased - Ulcers  . Diabetes Cousin   . Diabetes Brother   . Cancer Paternal Aunt     Social History   Social History  . Marital status: Married    Spouse name: N/A  . Number of children: N/A  . Years of education: N/A   Social History Main Topics  . Smoking status: Former Research scientist (life sciences)  . Smokeless tobacco: Never Used     Comment: Hasn't smoked since age 75  . Alcohol use No  . Drug use: No  . Sexual activity: Not Asked   Other Topics Concern  . None   Social History Narrative   Lives with wife in a one story home.  Has 1 child.  Retired Armed forces technical officer.  Education: Oceanographer.     Review of Systems - See HPI.  All other ROS are negative.  BP 118/66 (BP Location: Left Arm, Patient Position: Sitting, Cuff Size: Normal)   Pulse 68   Temp 97.6 F (36.4 C) (Oral)   Resp 16   Ht _0  (1.702 m)   Wt 161 lb 2 oz (73.1 kg)   SpO2 97%   BMI 25.24 kg/m   Physical Exam  Constitutional: He is oriented to person, place, and time and well-developed, well-nourished, and in no distress.  HENT:  Head: Normocephalic and atraumatic.  Eyes: Conjunctivae and EOM are normal. Pupils are equal, round, and reactive to light.  Neck: Neck supple.  Cardiovascular: Normal rate, regular rhythm, normal heart sounds and intact distal pulses.   Pulmonary/Chest: Effort normal and breath sounds normal. No respiratory distress. He has no wheezes. He has no rales. He exhibits no tenderness.  Lymphadenopathy:    He has no cervical adenopathy.  Neurological: He is alert and oriented to person, place, and time. No cranial nerve deficit. Coordination normal.  Strength 5/5 of bilateral upper and lower extremities.  Skin: Skin is warm and dry. No rash noted.  Psychiatric: Mood and affect normal.  Vitals reviewed.   Recent Results (from the past 2160 hour(s))  CBC with Differential     Status: Abnormal   Collection Time:  03/25/16  4:02 AM  Result Value Ref Range   WBC 8.4 4.0 - 10.5 K/uL   RBC 4.19 (L) 4.22 - 5.81 MIL/uL   Hemoglobin 12.6 (L) 13.0 - 17.0 g/dL   HCT 36.6 (L) 39.0 - 52.0 %   MCV 87.4 78.0 - 100.0 fL   MCH 30.1 26.0 - 34.0 pg   MCHC 34.4 30.0 - 36.0 g/dL   RDW 13.0 11.5 - 15.5 %   Platelets 209 150 - 400 K/uL   Neutrophils Relative % 45 %  Neutro Abs 3.9 1.7 - 7.7 K/uL   Lymphocytes Relative 35 %   Lymphs Abs 2.9 0.7 - 4.0 K/uL   Monocytes Relative 9 %   Monocytes Absolute 0.7 0.1 - 1.0 K/uL   Eosinophils Relative 10 %   Eosinophils Absolute 0.8 (H) 0.0 - 0.7 K/uL   Basophils Relative 1 %   Basophils Absolute 0.0 0.0 - 0.1 K/uL  Comprehensive metabolic panel     Status: Abnormal   Collection Time: 03/25/16  4:02 AM  Result Value Ref Range   Sodium 136 135 - 145 mmol/L   Potassium 4.3 3.5 - 5.1 mmol/L   Chloride 101 101 - 111 mmol/L   CO2 26 22 - 32 mmol/L   Glucose, Bld 198 (H) 65 - 99 mg/dL   BUN 19 6 - 20 mg/dL   Creatinine, Ser 0.91 0.61 - 1.24 mg/dL   Calcium 9.3 8.9 - 10.3 mg/dL   Total Protein 7.2 6.5 - 8.1 g/dL   Albumin 3.6 3.5 - 5.0 g/dL   AST 16 15 - 41 U/L   ALT 18 17 - 63 U/L   Alkaline Phosphatase 48 38 - 126 U/L   Total Bilirubin 1.0 0.3 - 1.2 mg/dL   GFR calc non Af Amer >60 >60 mL/min   GFR calc Af Amer >60 >60 mL/min    Comment: (NOTE) The eGFR has been calculated using the CKD EPI equation. This calculation has not been validated in all clinical situations. eGFR's persistently <60 mL/min signify possible Chronic Kidney Disease.    Anion gap 9 5 - 15  Troponin I     Status: None   Collection Time: 03/25/16  4:02 AM  Result Value Ref Range   Troponin I <0.03 <0.03 ng/mL  Urinalysis, Routine w reflex microscopic (not at [facility])     Status: Abnormal   Collection Time: 03/25/16  6:00 AM  Result Value Ref Range   Color, Urine YELLOW YELLOW   APPearance CLEAR CLEAR   Specific Gravity, Urine 1.012 1.005 - 1.030   pH 7.0 5.0 - 8.0   Glucose, UA 100 (A)  NEGATIVE mg/dL   Hgb urine dipstick NEGATIVE NEGATIVE   Bilirubin Urine NEGATIVE NEGATIVE   Ketones, ur NEGATIVE NEGATIVE mg/dL   Protein, ur NEGATIVE NEGATIVE mg/dL   Nitrite NEGATIVE NEGATIVE   Leukocytes, UA NEGATIVE NEGATIVE    Comment: MICROSCOPIC NOT DONE ON URINES WITH NEGATIVE PROTEIN, BLOOD, LEUKOCYTES, NITRITE, OR GLUCOSE <1000 mg/dL.  Vitamin B12     Status: Abnormal   Collection Time: 03/25/16  4:32 PM  Result Value Ref Range   Vitamin B-12 149 (L) 211 - 911 pg/mL  Sedimentation rate     Status: None   Collection Time: 03/25/16  4:32 PM  Result Value Ref Range   Sed Rate 15 0 - 20 mm/hr  CBG monitoring, ED     Status: Abnormal   Collection Time: 03/27/16  8:33 PM  Result Value Ref Range   Glucose-Capillary 159 (H) 65 - 99 mg/dL  CBC with Differential     Status: Abnormal   Collection Time: 03/27/16  8:35 PM  Result Value Ref Range   WBC 11.8 (H) 4.0 - 10.5 K/uL   RBC 4.01 (L) 4.22 - 5.81 MIL/uL   Hemoglobin 12.1 (L) 13.0 - 17.0 g/dL   HCT 35.2 (L) 39.0 - 52.0 %   MCV 87.8 78.0 - 100.0 fL   MCH 30.2 26.0 - 34.0 pg   MCHC 34.4 30.0 - 36.0 g/dL  RDW 12.9 11.5 - 15.5 %   Platelets 240 150 - 400 K/uL   Neutrophils Relative % 68 %   Neutro Abs 8.0 (H) 1.7 - 7.7 K/uL   Lymphocytes Relative 21 %   Lymphs Abs 2.4 0.7 - 4.0 K/uL   Monocytes Relative 8 %   Monocytes Absolute 1.0 0.1 - 1.0 K/uL   Eosinophils Relative 3 %   Eosinophils Absolute 0.3 0.0 - 0.7 K/uL   Basophils Relative 0 %   Basophils Absolute 0.1 0.0 - 0.1 K/uL  Comprehensive metabolic panel     Status: Abnormal   Collection Time: 03/27/16  8:35 PM  Result Value Ref Range   Sodium 138 135 - 145 mmol/L   Potassium 4.8 3.5 - 5.1 mmol/L   Chloride 103 101 - 111 mmol/L   CO2 25 22 - 32 mmol/L   Glucose, Bld 167 (H) 65 - 99 mg/dL   BUN 19 6 - 20 mg/dL   Creatinine, Ser 1.04 0.61 - 1.24 mg/dL   Calcium 9.3 8.9 - 10.3 mg/dL   Total Protein 6.8 6.5 - 8.1 g/dL   Albumin 3.5 3.5 - 5.0 g/dL   AST 19 15 - 41  U/L   ALT 19 17 - 63 U/L   Alkaline Phosphatase 54 38 - 126 U/L   Total Bilirubin 0.6 0.3 - 1.2 mg/dL   GFR calc non Af Amer >60 >60 mL/min   GFR calc Af Amer >60 >60 mL/min    Comment: (NOTE) The eGFR has been calculated using the CKD EPI equation. This calculation has not been validated in all clinical situations. eGFR's persistently <60 mL/min signify possible Chronic Kidney Disease.    Anion gap 10 5 - 15  Protime-INR     Status: None   Collection Time: 03/27/16  8:35 PM  Result Value Ref Range   Prothrombin Time 12.9 11.6 - 15.2 seconds   INR 0.95 0.00 - 1.49  Troponin I     Status: None   Collection Time: 03/27/16  8:35 PM  Result Value Ref Range   Troponin I <0.03 <0.03 ng/mL  Urinalysis, Routine w reflex microscopic     Status: Abnormal   Collection Time: 03/27/16  8:38 PM  Result Value Ref Range   Color, Urine YELLOW YELLOW   APPearance TURBID (A) CLEAR   Specific Gravity, Urine 1.015 1.005 - 1.030   pH 6.0 5.0 - 8.0   Glucose, UA 500 (A) NEGATIVE mg/dL   Hgb urine dipstick MODERATE (A) NEGATIVE   Bilirubin Urine NEGATIVE NEGATIVE   Ketones, ur NEGATIVE NEGATIVE mg/dL   Protein, ur NEGATIVE NEGATIVE mg/dL   Nitrite NEGATIVE NEGATIVE   Leukocytes, UA LARGE (A) NEGATIVE  Urine microscopic-add on     Status: Abnormal   Collection Time: 03/27/16  8:38 PM  Result Value Ref Range   Squamous Epithelial / LPF 0-5 (A) NONE SEEN   WBC, UA TOO NUMEROUS TO COUNT 0 - 5 WBC/hpf   RBC / HPF 6-30 0 - 5 RBC/hpf   Bacteria, UA MANY (A) NONE SEEN   Urine-Other MUCOUS PRESENT   Urine culture     Status: Abnormal   Collection Time: 03/27/16  8:38 PM  Result Value Ref Range   Specimen Description URINE, CLEAN CATCH    Special Requests NONE    Culture >=100,000 COLONIES/mL ENTEROCOCCUS SPECIES (A)    Report Status 03/30/2016 FINAL    Organism ID, Bacteria ENTEROCOCCUS SPECIES (A)       Susceptibility  Enterococcus species - MIC*    AMPICILLIN <=2 SENSITIVE Sensitive      LEVOFLOXACIN 1 SENSITIVE Sensitive     NITROFURANTOIN <=16 SENSITIVE Sensitive     VANCOMYCIN 1 SENSITIVE Sensitive     * >=100,000 COLONIES/mL ENTEROCOCCUS SPECIES  Basic metabolic panel     Status: Abnormal   Collection Time: 04/01/16 12:40 PM  Result Value Ref Range   Sodium 133 (L) 135 - 145 mEq/L   Potassium 4.8 3.5 - 5.1 mEq/L   Chloride 98 96 - 112 mEq/L   CO2 27 19 - 32 mEq/L   Glucose, Bld 244 (H) 70 - 99 mg/dL   BUN 19 6 - 23 mg/dL   Creatinine, Ser 1.10 0.40 - 1.50 mg/dL   Calcium 9.8 8.4 - 10.5 mg/dL   GFR 66.78 >60.00 mL/min  CBC     Status: Abnormal   Collection Time: 05/03/16  1:01 PM  Result Value Ref Range   WBC 9.8 4.0 - 10.5 K/uL   RBC 4.11 (L) 4.22 - 5.81 Mil/uL   Platelets 259.0 150.0 - 400.0 K/uL   Hemoglobin 12.1 (L) 13.0 - 17.0 g/dL   HCT 35.7 (L) 39.0 - 52.0 %   MCV 86.9 78.0 - 100.0 fl   MCHC 33.9 30.0 - 36.0 g/dL   RDW 14.1 11.5 - 31.5 %  Basic metabolic panel     Status: Abnormal   Collection Time: 05/30/16 12:30 PM  Result Value Ref Range   Sodium 130 (L) 135 - 145 mmol/L   Potassium 4.3 3.5 - 5.1 mmol/L   Chloride 96 (L) 101 - 111 mmol/L   CO2 26 22 - 32 mmol/L   Glucose, Bld 386 (H) 65 - 99 mg/dL   BUN 19 6 - 20 mg/dL   Creatinine, Ser 0.98 0.61 - 1.24 mg/dL   Calcium 9.4 8.9 - 10.3 mg/dL   GFR calc non Af Amer >60 >60 mL/min   GFR calc Af Amer >60 >60 mL/min    Comment: (NOTE) The eGFR has been calculated using the CKD EPI equation. This calculation has not been validated in all clinical situations. eGFR's persistently <60 mL/min signify possible Chronic Kidney Disease.    Anion gap 8 5 - 15  CBC with Differential     Status: Abnormal   Collection Time: 05/30/16 12:30 PM  Result Value Ref Range   WBC 8.6 4.0 - 10.5 K/uL   RBC 4.50 4.22 - 5.81 MIL/uL   Hemoglobin 13.5 13.0 - 17.0 g/dL   HCT 38.1 (L) 39.0 - 52.0 %   MCV 84.7 78.0 - 100.0 fL   MCH 30.0 26.0 - 34.0 pg   MCHC 35.4 30.0 - 36.0 g/dL   RDW 12.7 11.5 - 15.5 %    Platelets 201 150 - 400 K/uL   Neutrophils Relative % 49 %   Neutro Abs 4.2 1.7 - 7.7 K/uL   Lymphocytes Relative 31 %   Lymphs Abs 2.7 0.7 - 4.0 K/uL   Monocytes Relative 8 %   Monocytes Absolute 0.7 0.1 - 1.0 K/uL   Eosinophils Relative 11 %   Eosinophils Absolute 0.9 (H) 0.0 - 0.7 K/uL   Basophils Relative 1 %   Basophils Absolute 0.1 0.0 - 0.1 K/uL  Troponin I     Status: None   Collection Time: 05/30/16 12:30 PM  Result Value Ref Range   Troponin I <0.03 <0.03 ng/mL  CBG monitoring, ED     Status: Abnormal   Collection Time: 05/30/16 12:37 PM  Result Value Ref Range   Glucose-Capillary 378 (H) 65 - 99 mg/dL  Urinalysis, Routine w reflex microscopic     Status: Abnormal   Collection Time: 05/30/16  1:50 PM  Result Value Ref Range   Color, Urine YELLOW YELLOW   APPearance CLEAR CLEAR   Specific Gravity, Urine 1.017 1.005 - 1.030   pH 6.5 5.0 - 8.0   Glucose, UA >1000 (A) NEGATIVE mg/dL   Hgb urine dipstick NEGATIVE NEGATIVE   Bilirubin Urine NEGATIVE NEGATIVE   Ketones, ur NEGATIVE NEGATIVE mg/dL   Protein, ur NEGATIVE NEGATIVE mg/dL   Nitrite NEGATIVE NEGATIVE   Leukocytes, UA NEGATIVE NEGATIVE  Urine microscopic-add on     Status: None   Collection Time: 05/30/16  1:50 PM  Result Value Ref Range   Squamous Epithelial / LPF NONE SEEN NONE SEEN   WBC, UA 0-5 0 - 5 WBC/hpf   RBC / HPF 0-5 0 - 5 RBC/hpf   Bacteria, UA NONE SEEN NONE SEEN   Urine-Other MUCOUS PRESENT   CBG monitoring, ED     Status: Abnormal   Collection Time: 05/30/16  3:37 PM  Result Value Ref Range   Glucose-Capillary 267 (H) 65 - 99 mg/dL  POCT Glucose (Device for Home Use)     Status: Abnormal   Collection Time: 06/04/16  3:43 PM  Result Value Ref Range   Glucose Fasting, POC  70 - 99 mg/dL   POC Glucose 318 (A) 70 - 99 mg/dl  Hemoglobin A1c     Status: Abnormal   Collection Time: 06/04/16  3:53 PM  Result Value Ref Range   Hgb A1c MFr Bld 9.7 (H) 4.6 - 6.5 %    Comment: Glycemic Control  Guidelines for People with Diabetes:Non Diabetic:  <6%Goal of Therapy: <7%Additional Action Suggested:  >8%   Comp Met (CMET)     Status: Abnormal   Collection Time: 06/04/16  3:53 PM  Result Value Ref Range   Sodium 132 (L) 135 - 145 mEq/L   Potassium 5.6 (H) 3.5 - 5.1 mEq/L   Chloride 96 96 - 112 mEq/L   CO2 28 19 - 32 mEq/L   Glucose, Bld 305 (H) 70 - 99 mg/dL   BUN 21 6 - 23 mg/dL   Creatinine, Ser 1.35 0.40 - 1.50 mg/dL   Total Bilirubin 0.4 0.2 - 1.2 mg/dL   Alkaline Phosphatase 78 39 - 117 U/L   AST 14 0 - 37 U/L   ALT 15 0 - 53 U/L   Total Protein 7.4 6.0 - 8.3 g/dL   Albumin 4.1 3.5 - 5.2 g/dL   Calcium 9.7 8.4 - 10.5 mg/dL   GFR 52.70 (L) >60.00 mL/min    Assessment/Plan: 1. Hyperglycemia POC glucose at 318, non-fasting. Will check labs today. Will increase Metformin XR back to 100 mg. Diet is a huge issue. Discussed proper balance of carbs to proteins and vegetables. No more pints of ice cream. Water instead of lemonade. FU scheduled for next week. - Hemoglobin A1c - Comp Met (CMET) - POCT Glucose (Device for Home Use)  2. Stroke-like symptoms Resolved. Question mild stroke versus TIA. BP stable. Patient to continue 81 mg ASA. I discussed with them that I do not agree with their decision to leave ER AMA but am glad Leonell is feeling ok . They have follow-up scheduled with Neurology next week. Also have MRI scheduled for this weekend. Goal will be to keep BP stable, continue antiplatelet and work on glycemic control (see  above). Alarm signs/symptoms reviewed with patient and wife that would prompt immediate return to ER.    Leeanne Rio, PA-C

## 2016-06-04 NOTE — Patient Instructions (Addendum)
Please go to the lab for blood work I will call with your results Please check fasting sugars once daily and record. Goal is < 150 at the moment.  Please restart Metformin at 2 tablets daily. Follow-up in 2 weeks with log.  Continue all other medications as directed.  If you notice any recurrence of symptoms, please call 911. Also if sugars are > 350 on medication, please go to the ER. Keep him well hydrated. Do not go overboard with the glucerna shakes.  Follow-up for MRI on Saturday as scheduled.

## 2016-06-05 ENCOUNTER — Encounter: Payer: Self-pay | Admitting: Physician Assistant

## 2016-06-05 DIAGNOSIS — I10 Essential (primary) hypertension: Secondary | ICD-10-CM | POA: Diagnosis not present

## 2016-06-05 DIAGNOSIS — R339 Retention of urine, unspecified: Secondary | ICD-10-CM | POA: Diagnosis not present

## 2016-06-05 DIAGNOSIS — F0390 Unspecified dementia without behavioral disturbance: Secondary | ICD-10-CM | POA: Diagnosis not present

## 2016-06-05 DIAGNOSIS — I480 Paroxysmal atrial fibrillation: Secondary | ICD-10-CM | POA: Diagnosis not present

## 2016-06-05 DIAGNOSIS — E1165 Type 2 diabetes mellitus with hyperglycemia: Secondary | ICD-10-CM | POA: Diagnosis not present

## 2016-06-05 DIAGNOSIS — R296 Repeated falls: Secondary | ICD-10-CM | POA: Diagnosis not present

## 2016-06-05 LAB — COMPREHENSIVE METABOLIC PANEL
ALBUMIN: 4.1 g/dL (ref 3.5–5.2)
ALK PHOS: 78 U/L (ref 39–117)
ALT: 15 U/L (ref 0–53)
AST: 14 U/L (ref 0–37)
BUN: 21 mg/dL (ref 6–23)
CALCIUM: 9.7 mg/dL (ref 8.4–10.5)
CO2: 28 mEq/L (ref 19–32)
Chloride: 96 mEq/L (ref 96–112)
Creatinine, Ser: 1.35 mg/dL (ref 0.40–1.50)
GFR: 52.7 mL/min — ABNORMAL LOW (ref >60.00)
Glucose, Bld: 305 mg/dL — ABNORMAL HIGH (ref 70–99)
POTASSIUM: 5.6 meq/L — AB (ref 3.5–5.1)
SODIUM: 132 meq/L — AB (ref 135–145)
TOTAL PROTEIN: 7.4 g/dL (ref 6.0–8.3)
Total Bilirubin: 0.4 mg/dL (ref 0.2–1.2)

## 2016-06-05 LAB — HEMOGLOBIN A1C: Hgb A1c MFr Bld: 9.7 % — ABNORMAL HIGH (ref 4.6–6.5)

## 2016-06-05 NOTE — Telephone Encounter (Signed)
Received PT orders from [place]. Forms signed and faxed to [phone]. Forms sent for scanning.

## 2016-06-06 ENCOUNTER — Telehealth: Payer: Self-pay | Admitting: Physician Assistant

## 2016-06-06 NOTE — Telephone Encounter (Signed)
Dr. Rosendo Gros from [facility] would like a call back to discuss starting a medication for this patient. States that patient has a rash but he is hesitant to start a medication without consulting with PCP because of the patients age and medical history.   Office Number: [phone] Cell Number: [phone]

## 2016-06-06 NOTE — Telephone Encounter (Signed)
Called Dr. Melina Copa from home. Discussed recent lab results. Patient with follow-up with me for hyperglycemia. IS following a highly carb-restricted diet. Metformin has been increased. Will be adding additional medication at next visit. Dr. Melina Copa is starting very low dose of Methotrexate for patient's chronic spongiotic dermatitis. Will be monitoring labs and faxing [name] copies as well.

## 2016-06-07 DIAGNOSIS — E1165 Type 2 diabetes mellitus with hyperglycemia: Secondary | ICD-10-CM | POA: Diagnosis not present

## 2016-06-07 DIAGNOSIS — I10 Essential (primary) hypertension: Secondary | ICD-10-CM | POA: Diagnosis not present

## 2016-06-07 DIAGNOSIS — I480 Paroxysmal atrial fibrillation: Secondary | ICD-10-CM | POA: Diagnosis not present

## 2016-06-07 DIAGNOSIS — R339 Retention of urine, unspecified: Secondary | ICD-10-CM | POA: Diagnosis not present

## 2016-06-07 DIAGNOSIS — F0390 Unspecified dementia without behavioral disturbance: Secondary | ICD-10-CM | POA: Diagnosis not present

## 2016-06-07 DIAGNOSIS — R296 Repeated falls: Secondary | ICD-10-CM | POA: Diagnosis not present

## 2016-06-08 ENCOUNTER — Ambulatory Visit
Admission: RE | Admit: 2016-06-08 | Discharge: 2016-06-08 | Disposition: A | Discharge status: Home / Self Care | Payer: Medicare Other | Source: Ambulatory Visit | Attending: Family Medicine | Admitting: Family Medicine

## 2016-06-08 DIAGNOSIS — F0390 Unspecified dementia without behavioral disturbance: Secondary | ICD-10-CM

## 2016-06-08 DIAGNOSIS — R4781 Slurred speech: Secondary | ICD-10-CM | POA: Diagnosis not present

## 2016-06-10 ENCOUNTER — Encounter: Payer: Self-pay | Admitting: Family Medicine

## 2016-06-10 ENCOUNTER — Encounter: Payer: Self-pay | Admitting: Neurology

## 2016-06-10 DIAGNOSIS — E1165 Type 2 diabetes mellitus with hyperglycemia: Secondary | ICD-10-CM | POA: Diagnosis not present

## 2016-06-10 DIAGNOSIS — I480 Paroxysmal atrial fibrillation: Secondary | ICD-10-CM | POA: Diagnosis not present

## 2016-06-10 DIAGNOSIS — R339 Retention of urine, unspecified: Secondary | ICD-10-CM | POA: Diagnosis not present

## 2016-06-10 DIAGNOSIS — I10 Essential (primary) hypertension: Secondary | ICD-10-CM | POA: Diagnosis not present

## 2016-06-10 DIAGNOSIS — R296 Repeated falls: Secondary | ICD-10-CM | POA: Diagnosis not present

## 2016-06-10 DIAGNOSIS — F0390 Unspecified dementia without behavioral disturbance: Secondary | ICD-10-CM | POA: Diagnosis not present

## 2016-06-11 ENCOUNTER — Encounter: Payer: Self-pay | Admitting: Physician Assistant

## 2016-06-11 DIAGNOSIS — E1165 Type 2 diabetes mellitus with hyperglycemia: Secondary | ICD-10-CM | POA: Diagnosis not present

## 2016-06-11 DIAGNOSIS — F0390 Unspecified dementia without behavioral disturbance: Secondary | ICD-10-CM | POA: Diagnosis not present

## 2016-06-11 DIAGNOSIS — R296 Repeated falls: Secondary | ICD-10-CM | POA: Diagnosis not present

## 2016-06-11 DIAGNOSIS — I480 Paroxysmal atrial fibrillation: Secondary | ICD-10-CM | POA: Diagnosis not present

## 2016-06-11 DIAGNOSIS — R339 Retention of urine, unspecified: Secondary | ICD-10-CM | POA: Diagnosis not present

## 2016-06-11 DIAGNOSIS — I10 Essential (primary) hypertension: Secondary | ICD-10-CM | POA: Diagnosis not present

## 2016-06-12 ENCOUNTER — Encounter: Payer: Self-pay | Admitting: Physician Assistant

## 2016-06-12 NOTE — Telephone Encounter (Signed)
Please send verbal order for lipid panel to [facility] so the nurse can draw on patient. Thank you.

## 2016-06-12 NOTE — Telephone Encounter (Signed)
Verbal order was given to Aleen Sells, RN at [facility] for lipid panel lab draw.

## 2016-06-12 NOTE — Telephone Encounter (Signed)
Received fax, completed signature and date. Sent for scanning.

## 2016-06-13 DIAGNOSIS — I480 Paroxysmal atrial fibrillation: Secondary | ICD-10-CM | POA: Diagnosis not present

## 2016-06-13 DIAGNOSIS — Z79899 Other long term (current) drug therapy: Secondary | ICD-10-CM | POA: Diagnosis not present

## 2016-06-13 DIAGNOSIS — R296 Repeated falls: Secondary | ICD-10-CM | POA: Diagnosis not present

## 2016-06-13 DIAGNOSIS — R339 Retention of urine, unspecified: Secondary | ICD-10-CM | POA: Diagnosis not present

## 2016-06-13 DIAGNOSIS — F0390 Unspecified dementia without behavioral disturbance: Secondary | ICD-10-CM | POA: Diagnosis not present

## 2016-06-13 DIAGNOSIS — I10 Essential (primary) hypertension: Secondary | ICD-10-CM | POA: Diagnosis not present

## 2016-06-13 DIAGNOSIS — E1165 Type 2 diabetes mellitus with hyperglycemia: Secondary | ICD-10-CM | POA: Diagnosis not present

## 2016-06-17 DIAGNOSIS — E1165 Type 2 diabetes mellitus with hyperglycemia: Secondary | ICD-10-CM | POA: Diagnosis not present

## 2016-06-17 DIAGNOSIS — I480 Paroxysmal atrial fibrillation: Secondary | ICD-10-CM | POA: Diagnosis not present

## 2016-06-17 DIAGNOSIS — R339 Retention of urine, unspecified: Secondary | ICD-10-CM | POA: Diagnosis not present

## 2016-06-17 DIAGNOSIS — I10 Essential (primary) hypertension: Secondary | ICD-10-CM | POA: Diagnosis not present

## 2016-06-17 DIAGNOSIS — R296 Repeated falls: Secondary | ICD-10-CM | POA: Diagnosis not present

## 2016-06-17 DIAGNOSIS — F0390 Unspecified dementia without behavioral disturbance: Secondary | ICD-10-CM | POA: Diagnosis not present

## 2016-06-18 ENCOUNTER — Telehealth: Payer: Self-pay | Admitting: Physician Assistant

## 2016-06-18 ENCOUNTER — Encounter: Payer: Self-pay | Admitting: Physician Assistant

## 2016-06-18 DIAGNOSIS — E1165 Type 2 diabetes mellitus with hyperglycemia: Secondary | ICD-10-CM | POA: Diagnosis not present

## 2016-06-18 DIAGNOSIS — I10 Essential (primary) hypertension: Secondary | ICD-10-CM | POA: Diagnosis not present

## 2016-06-18 DIAGNOSIS — R339 Retention of urine, unspecified: Secondary | ICD-10-CM | POA: Diagnosis not present

## 2016-06-18 DIAGNOSIS — R296 Repeated falls: Secondary | ICD-10-CM | POA: Diagnosis not present

## 2016-06-18 DIAGNOSIS — F0390 Unspecified dementia without behavioral disturbance: Secondary | ICD-10-CM | POA: Diagnosis not present

## 2016-06-18 DIAGNOSIS — I480 Paroxysmal atrial fibrillation: Secondary | ICD-10-CM | POA: Diagnosis not present

## 2016-06-18 NOTE — Telephone Encounter (Signed)
I will take care of it at visit.  Ok to discharge care for now.

## 2016-06-18 NOTE — Telephone Encounter (Addendum)
Attempt to reach caller via phone, could not reach; will call again tomorrow 06/19/16/SLS 09/26

## 2016-06-18 NOTE — Telephone Encounter (Signed)
Caller name:Amy Relationship to patient: [facility] Can be reached: [phone] Pharmacy:  Reason for call: [facility] called to inform provider that she was unable to draw blood from patient to gtet his Lipid panel. Wife wants to know if it can be drawn when patient sees provider. [facility] also wants to know if patient can be discharged from nurse visits next week> Plse adv

## 2016-06-19 DIAGNOSIS — I480 Paroxysmal atrial fibrillation: Secondary | ICD-10-CM | POA: Diagnosis not present

## 2016-06-19 DIAGNOSIS — I10 Essential (primary) hypertension: Secondary | ICD-10-CM | POA: Diagnosis not present

## 2016-06-19 DIAGNOSIS — R296 Repeated falls: Secondary | ICD-10-CM | POA: Diagnosis not present

## 2016-06-19 DIAGNOSIS — E1165 Type 2 diabetes mellitus with hyperglycemia: Secondary | ICD-10-CM | POA: Diagnosis not present

## 2016-06-19 DIAGNOSIS — F0390 Unspecified dementia without behavioral disturbance: Secondary | ICD-10-CM | POA: Diagnosis not present

## 2016-06-19 DIAGNOSIS — R339 Retention of urine, unspecified: Secondary | ICD-10-CM | POA: Diagnosis not present

## 2016-06-19 NOTE — Telephone Encounter (Signed)
Caller informed, understood & agreed; PT will be calling for VO to continue, as they report seeing some progress with therapy/SLS 09/27

## 2016-06-20 DIAGNOSIS — Z79899 Other long term (current) drug therapy: Secondary | ICD-10-CM | POA: Diagnosis not present

## 2016-06-20 DIAGNOSIS — R42 Dizziness and giddiness: Secondary | ICD-10-CM | POA: Diagnosis not present

## 2016-06-20 DIAGNOSIS — F0280 Dementia in other diseases classified elsewhere without behavioral disturbance: Secondary | ICD-10-CM | POA: Diagnosis not present

## 2016-06-20 DIAGNOSIS — R531 Weakness: Secondary | ICD-10-CM | POA: Diagnosis not present

## 2016-06-21 ENCOUNTER — Encounter: Payer: Self-pay | Admitting: Physician Assistant

## 2016-06-21 ENCOUNTER — Encounter: Payer: Self-pay | Admitting: Neurology

## 2016-06-21 ENCOUNTER — Ambulatory Visit: Payer: Medicare Other | Admitting: Physician Assistant

## 2016-06-21 ENCOUNTER — Ambulatory Visit (INDEPENDENT_AMBULATORY_CARE_PROVIDER_SITE_OTHER): Payer: Medicare Other | Admitting: Physician Assistant

## 2016-06-21 VITALS — BP 132/64 | HR 56 | Resp 16 | Ht 67.0 in | Wt 162.0 lb

## 2016-06-21 DIAGNOSIS — E785 Hyperlipidemia, unspecified: Secondary | ICD-10-CM | POA: Diagnosis not present

## 2016-06-21 DIAGNOSIS — Z23 Encounter for immunization: Secondary | ICD-10-CM | POA: Diagnosis not present

## 2016-06-21 DIAGNOSIS — E1165 Type 2 diabetes mellitus with hyperglycemia: Secondary | ICD-10-CM | POA: Diagnosis not present

## 2016-06-21 DIAGNOSIS — IMO0001 Reserved for inherently not codable concepts without codable children: Secondary | ICD-10-CM

## 2016-06-21 LAB — BASIC METABOLIC PANEL
BUN: 19 mg/dL (ref 6–23)
CALCIUM: 9.4 mg/dL (ref 8.4–10.5)
CO2: 31 mEq/L (ref 19–32)
CREATININE: 0.97 mg/dL (ref 0.40–1.50)
Chloride: 98 mEq/L (ref 96–112)
GFR: 77.17 mL/min (ref >60.00)
Glucose, Bld: 189 mg/dL — ABNORMAL HIGH (ref 70–99)
Potassium: 4.3 mEq/L (ref 3.5–5.1)
Sodium: 135 mEq/L (ref 135–145)

## 2016-06-21 LAB — LIPID PANEL
CHOLESTEROL: 163 mg/dL (ref 0–200)
HDL: 39.9 mg/dL (ref >39.00)
LDL Cholesterol: 92 mg/dL (ref 0–99)
NonHDL: 122.74
TRIGLYCERIDES: 153 mg/dL — AB (ref 0.0–149.0)
Total CHOL/HDL Ratio: 4
VLDL: 30.6 mg/dL (ref 0.0–40.0)

## 2016-06-21 NOTE — Patient Instructions (Signed)
Please go to the lab for blood work. I will call you with your results.  Please continue good diet. Keep a check on fasting sugars. If kidney function is stable, will increase metformin further.  Follow-up with specialists and physical therapy as directed.

## 2016-06-21 NOTE — Progress Notes (Signed)
Patient presents to clinic today with wife for follow-up of Diabetes Mellitus II. Patient with recent recurrence of hyperglycemia due to significantly poor diet and sedentary lifestyle. Over the past several weeks we have worked on titration of Metformin. Patient is currently on 1000 mg AM and 500 mg PM. Tolerating well. Wife endorses fasting sugars have reduces from 350s+ to lower 200s. Denies vision changes, AMS, polyuria, polydipsia or polyphagia.  Lab Results  Component Value Date   HGBA1C 9.7 (H) 06/04/2016    Past Medical History:  Diagnosis Date  . A-fib ([place])   . Cataracts, bilateral   . Chronic dermatitis   . Diabetes type 2, controlled ([place]) 2000  . History of chicken pox   . Mumps    Adult  . Retinopathy   . Stroke [facility]) Nov 2011  . Undescended testicle, unilateral    Right  . Whooping cough     Current Outpatient Prescriptions on File Prior to Visit  Medication Sig Dispense Refill  . aspirin 81 MG tablet Take 1 tablet (81 mg total) by mouth daily.    . clonazePAM (KLONOPIN) 0.5 MG tablet Take 1 tablet (0.5 mg total) by mouth 2 (two) times daily as needed (agitation and insomnia). 60 tablet 1  . cyanocobalamin (,VITAMIN B-12,) 1000 MCG/ML injection Inject 1000 mcg intramuscular daily for 7 days, then once a week for 4 weeks, then once a month for a year (Patient taking differently: every 30 (thirty) days. Inject 1000 mcg intramuscular daily for 7 days, then once a week for 4 weeks, then once a month for a year) 25 mL 0  . docusate sodium (COLACE) 100 MG capsule Take 200 mg by mouth.     . Eyelid Cleansers (STERILID EX) Apply topically. Eye Wash: Once Daily    . levothyroxine (SYNTHROID, LEVOTHROID) 50 MCG tablet Take 1 tablet (50 mcg total) by mouth daily. 90 tablet 3  . lisinopril (PRINIVIL,ZESTRIL) 2.5 MG tablet Take 1 tablet (2.5 mg total) by mouth daily. 90 tablet 3  . metFORMIN (GLUCOPHAGE-XR) 500 MG 24 hr tablet Take 2 tablets (1,000 mg total) by mouth daily with  breakfast. (Patient taking differently: Take 1,000 mg by mouth daily with breakfast. Take [2] tablets in QAM and [1] tablet QHs) 180 tablet 3  . metoprolol tartrate (LOPRESSOR) 25 MG tablet Take 0.5 tablets (12.5 mg total) by mouth 2 (two) times daily. 180 tablet 1  . NEEDLE, DISP, 25 G 25G X 1-1/2" MISC 1 each by Does not apply route as directed. Use to draw and inject B12 50 each 0  . Omega-3 Fatty Acids (SB OMEGA-3 FISH OIL) 1000 MG CAPS Take 1 capsule by mouth 2 (two) times daily. Reported on 10/06/2015    . OVER THE COUNTER MEDICATION Take 1 each by mouth 2 (two) times daily. Focus Select Supplement: Vitamin C & E, Zinc & Copper    . Syringe, Disposable, 3 ML MISC 1 each by Does not apply route as directed. 25 each 0  . triamcinolone cream (KENALOG) 0.1 % Apply 1 application topically as needed.      No current facility-administered medications on file prior to visit.     Allergies  Allergen Reactions  . Flomax [Tamsulosin Hcl] Other (See Comments)    Hypotension  . Shellfish Allergy Anaphylaxis  . Aspirin-Dipyridamole Er     Headaches   . Other Other (See Comments)    Blood Thinner given in Rehab gave H/As - Not Coumadin/Headaches      Family  History  Problem Relation Age of Onset  . Pneumonia Mother 54    Deceased  . GI Bleed Father 46    Deceased - Ulcers  . Diabetes Cousin   . Diabetes Brother   . Cancer Paternal Aunt     Social History   Social History  . Marital status: Married    Spouse name: N/A  . Number of children: N/A  . Years of education: N/A   Social History Main Topics  . Smoking status: Former Research scientist (life sciences)  . Smokeless tobacco: Never Used     Comment: Hasn't smoked since age 23  . Alcohol use No  . Drug use: No  . Sexual activity: Not Asked   Other Topics Concern  . None   Social History Narrative   Lives with wife in a one story home.  Has 1 child.  Retired Armed forces technical officer.  Education: Oceanographer.      Review of Systems - See HPI.  All other ROS  are negative.  BP 132/64 (BP Location: Right Arm, Patient Position: Sitting, Cuff Size: Normal)   Pulse (!) 56   Resp 16   Ht '5\' 7"'  (1.702 m)   Wt 162 lb (73.5 kg)   SpO2 98%   BMI 25.37 kg/m   Physical Exam  Constitutional: He is oriented to person, place, and time and well-developed, well-nourished, and in no distress.  HENT:  Head: Normocephalic and atraumatic.  Eyes: Conjunctivae are normal.  Neck: Neck supple.  Cardiovascular: Normal rate, regular rhythm, normal heart sounds and intact distal pulses.   Pulmonary/Chest: Effort normal and breath sounds normal.  Lymphadenopathy:    He has no cervical adenopathy.  Neurological: He is alert and oriented to person, place, and time.  Skin: Skin is warm and dry. No rash noted.  Psychiatric: Affect normal.  Vitals reviewed.   Recent Results (from the past 2160 hour(s))  CBC with Differential     Status: Abnormal   Collection Time: 03/25/16  4:02 AM  Result Value Ref Range   WBC 8.4 4.0 - 10.5 K/uL   RBC 4.19 (L) 4.22 - 5.81 MIL/uL   Hemoglobin 12.6 (L) 13.0 - 17.0 g/dL   HCT 36.6 (L) 39.0 - 52.0 %   MCV 87.4 78.0 - 100.0 fL   MCH 30.1 26.0 - 34.0 pg   MCHC 34.4 30.0 - 36.0 g/dL   RDW 13.0 11.5 - 15.5 %   Platelets 209 150 - 400 K/uL   Neutrophils Relative % 45 %   Neutro Abs 3.9 1.7 - 7.7 K/uL   Lymphocytes Relative 35 %   Lymphs Abs 2.9 0.7 - 4.0 K/uL   Monocytes Relative 9 %   Monocytes Absolute 0.7 0.1 - 1.0 K/uL   Eosinophils Relative 10 %   Eosinophils Absolute 0.8 (H) 0.0 - 0.7 K/uL   Basophils Relative 1 %   Basophils Absolute 0.0 0.0 - 0.1 K/uL  Comprehensive metabolic panel     Status: Abnormal   Collection Time: 03/25/16  4:02 AM  Result Value Ref Range   Sodium 136 135 - 145 mmol/L   Potassium 4.3 3.5 - 5.1 mmol/L   Chloride 101 101 - 111 mmol/L   CO2 26 22 - 32 mmol/L   Glucose, Bld 198 (H) 65 - 99 mg/dL   BUN 19 6 - 20 mg/dL   Creatinine, Ser 0.91 0.61 - 1.24 mg/dL   Calcium 9.3 8.9 - 10.3 mg/dL    Total Protein 7.2 6.5 - 8.1 g/dL  Albumin 3.6 3.5 - 5.0 g/dL   AST 16 15 - 41 U/L   ALT 18 17 - 63 U/L   Alkaline Phosphatase 48 38 - 126 U/L   Total Bilirubin 1.0 0.3 - 1.2 mg/dL   GFR calc non Af Amer >60 >60 mL/min   GFR calc Af Amer >60 >60 mL/min    Comment: (NOTE) The eGFR has been calculated using the CKD EPI equation. This calculation has not been validated in all clinical situations. eGFR's persistently <60 mL/min signify possible Chronic Kidney Disease.    Anion gap 9 5 - 15  Troponin I     Status: None   Collection Time: 03/25/16  4:02 AM  Result Value Ref Range   Troponin I <0.03 <0.03 ng/mL  Urinalysis, Routine w reflex microscopic (not at [facility])     Status: Abnormal   Collection Time: 03/25/16  6:00 AM  Result Value Ref Range   Color, Urine YELLOW YELLOW   APPearance CLEAR CLEAR   Specific Gravity, Urine 1.012 1.005 - 1.030   pH 7.0 5.0 - 8.0   Glucose, UA 100 (A) NEGATIVE mg/dL   Hgb urine dipstick NEGATIVE NEGATIVE   Bilirubin Urine NEGATIVE NEGATIVE   Ketones, ur NEGATIVE NEGATIVE mg/dL   Protein, ur NEGATIVE NEGATIVE mg/dL   Nitrite NEGATIVE NEGATIVE   Leukocytes, UA NEGATIVE NEGATIVE    Comment: MICROSCOPIC NOT DONE ON URINES WITH NEGATIVE PROTEIN, BLOOD, LEUKOCYTES, NITRITE, OR GLUCOSE <1000 mg/dL.  Vitamin B12     Status: Abnormal   Collection Time: 03/25/16  4:32 PM  Result Value Ref Range   Vitamin B-12 149 (L) 211 - 911 pg/mL  Sedimentation rate     Status: None   Collection Time: 03/25/16  4:32 PM  Result Value Ref Range   Sed Rate 15 0 - 20 mm/hr  CBG monitoring, ED     Status: Abnormal   Collection Time: 03/27/16  8:33 PM  Result Value Ref Range   Glucose-Capillary 159 (H) 65 - 99 mg/dL  CBC with Differential     Status: Abnormal   Collection Time: 03/27/16  8:35 PM  Result Value Ref Range   WBC 11.8 (H) 4.0 - 10.5 K/uL   RBC 4.01 (L) 4.22 - 5.81 MIL/uL   Hemoglobin 12.1 (L) 13.0 - 17.0 g/dL   HCT 35.2 (L) 39.0 - 52.0 %   MCV 87.8 78.0 -  100.0 fL   MCH 30.2 26.0 - 34.0 pg   MCHC 34.4 30.0 - 36.0 g/dL   RDW 12.9 11.5 - 15.5 %   Platelets 240 150 - 400 K/uL   Neutrophils Relative % 68 %   Neutro Abs 8.0 (H) 1.7 - 7.7 K/uL   Lymphocytes Relative 21 %   Lymphs Abs 2.4 0.7 - 4.0 K/uL   Monocytes Relative 8 %   Monocytes Absolute 1.0 0.1 - 1.0 K/uL   Eosinophils Relative 3 %   Eosinophils Absolute 0.3 0.0 - 0.7 K/uL   Basophils Relative 0 %   Basophils Absolute 0.1 0.0 - 0.1 K/uL  Comprehensive metabolic panel     Status: Abnormal   Collection Time: 03/27/16  8:35 PM  Result Value Ref Range   Sodium 138 135 - 145 mmol/L   Potassium 4.8 3.5 - 5.1 mmol/L   Chloride 103 101 - 111 mmol/L   CO2 25 22 - 32 mmol/L   Glucose, Bld 167 (H) 65 - 99 mg/dL   BUN 19 6 - 20 mg/dL   Creatinine, Ser  1.04 0.61 - 1.24 mg/dL   Calcium 9.3 8.9 - 10.3 mg/dL   Total Protein 6.8 6.5 - 8.1 g/dL   Albumin 3.5 3.5 - 5.0 g/dL   AST 19 15 - 41 U/L   ALT 19 17 - 63 U/L   Alkaline Phosphatase 54 38 - 126 U/L   Total Bilirubin 0.6 0.3 - 1.2 mg/dL   GFR calc non Af Amer >60 >60 mL/min   GFR calc Af Amer >60 >60 mL/min    Comment: (NOTE) The eGFR has been calculated using the CKD EPI equation. This calculation has not been validated in all clinical situations. eGFR's persistently <60 mL/min signify possible Chronic Kidney Disease.    Anion gap 10 5 - 15  Protime-INR     Status: None   Collection Time: 03/27/16  8:35 PM  Result Value Ref Range   Prothrombin Time 12.9 11.6 - 15.2 seconds   INR 0.95 0.00 - 1.49  Troponin I     Status: None   Collection Time: 03/27/16  8:35 PM  Result Value Ref Range   Troponin I <0.03 <0.03 ng/mL  Urinalysis, Routine w reflex microscopic     Status: Abnormal   Collection Time: 03/27/16  8:38 PM  Result Value Ref Range   Color, Urine YELLOW YELLOW   APPearance TURBID (A) CLEAR   Specific Gravity, Urine 1.015 1.005 - 1.030   pH 6.0 5.0 - 8.0   Glucose, UA 500 (A) NEGATIVE mg/dL   Hgb urine dipstick  MODERATE (A) NEGATIVE   Bilirubin Urine NEGATIVE NEGATIVE   Ketones, ur NEGATIVE NEGATIVE mg/dL   Protein, ur NEGATIVE NEGATIVE mg/dL   Nitrite NEGATIVE NEGATIVE   Leukocytes, UA LARGE (A) NEGATIVE  Urine microscopic-add on     Status: Abnormal   Collection Time: 03/27/16  8:38 PM  Result Value Ref Range   Squamous Epithelial / LPF 0-5 (A) NONE SEEN   WBC, UA TOO NUMEROUS TO COUNT 0 - 5 WBC/hpf   RBC / HPF 6-30 0 - 5 RBC/hpf   Bacteria, UA MANY (A) NONE SEEN   Urine-Other MUCOUS PRESENT   Urine culture     Status: Abnormal   Collection Time: 03/27/16  8:38 PM  Result Value Ref Range   Specimen Description URINE, CLEAN CATCH    Special Requests NONE    Culture >=100,000 COLONIES/mL ENTEROCOCCUS SPECIES (A)    Report Status 03/30/2016 FINAL    Organism ID, Bacteria ENTEROCOCCUS SPECIES (A)       Susceptibility   Enterococcus species - MIC*    AMPICILLIN <=2 SENSITIVE Sensitive     LEVOFLOXACIN 1 SENSITIVE Sensitive     NITROFURANTOIN <=16 SENSITIVE Sensitive     VANCOMYCIN 1 SENSITIVE Sensitive     * >=100,000 COLONIES/mL ENTEROCOCCUS SPECIES  Basic metabolic panel     Status: Abnormal   Collection Time: 04/01/16 12:40 PM  Result Value Ref Range   Sodium 133 (L) 135 - 145 mEq/L   Potassium 4.8 3.5 - 5.1 mEq/L   Chloride 98 96 - 112 mEq/L   CO2 27 19 - 32 mEq/L   Glucose, Bld 244 (H) 70 - 99 mg/dL   BUN 19 6 - 23 mg/dL   Creatinine, Ser 1.10 0.40 - 1.50 mg/dL   Calcium 9.8 8.4 - 10.5 mg/dL   GFR 66.78 >60.00 mL/min  CBC     Status: Abnormal   Collection Time: 05/03/16  1:01 PM  Result Value Ref Range   WBC 9.8 4.0 - 10.5  K/uL   RBC 4.11 (L) 4.22 - 5.81 Mil/uL   Platelets 259.0 150.0 - 400.0 K/uL   Hemoglobin 12.1 (L) 13.0 - 17.0 g/dL   HCT 35.7 (L) 39.0 - 52.0 %   MCV 86.9 78.0 - 100.0 fl   MCHC 33.9 30.0 - 36.0 g/dL   RDW 14.1 11.5 - 62.8 %  Basic metabolic panel     Status: Abnormal   Collection Time: 05/30/16 12:30 PM  Result Value Ref Range   Sodium 130 (L) 135  - 145 mmol/L   Potassium 4.3 3.5 - 5.1 mmol/L   Chloride 96 (L) 101 - 111 mmol/L   CO2 26 22 - 32 mmol/L   Glucose, Bld 386 (H) 65 - 99 mg/dL   BUN 19 6 - 20 mg/dL   Creatinine, Ser 0.98 0.61 - 1.24 mg/dL   Calcium 9.4 8.9 - 10.3 mg/dL   GFR calc non Af Amer >60 >60 mL/min   GFR calc Af Amer >60 >60 mL/min    Comment: (NOTE) The eGFR has been calculated using the CKD EPI equation. This calculation has not been validated in all clinical situations. eGFR's persistently <60 mL/min signify possible Chronic Kidney Disease.    Anion gap 8 5 - 15  CBC with Differential     Status: Abnormal   Collection Time: 05/30/16 12:30 PM  Result Value Ref Range   WBC 8.6 4.0 - 10.5 K/uL   RBC 4.50 4.22 - 5.81 MIL/uL   Hemoglobin 13.5 13.0 - 17.0 g/dL   HCT 38.1 (L) 39.0 - 52.0 %   MCV 84.7 78.0 - 100.0 fL   MCH 30.0 26.0 - 34.0 pg   MCHC 35.4 30.0 - 36.0 g/dL   RDW 12.7 11.5 - 15.5 %   Platelets 201 150 - 400 K/uL   Neutrophils Relative % 49 %   Neutro Abs 4.2 1.7 - 7.7 K/uL   Lymphocytes Relative 31 %   Lymphs Abs 2.7 0.7 - 4.0 K/uL   Monocytes Relative 8 %   Monocytes Absolute 0.7 0.1 - 1.0 K/uL   Eosinophils Relative 11 %   Eosinophils Absolute 0.9 (H) 0.0 - 0.7 K/uL   Basophils Relative 1 %   Basophils Absolute 0.1 0.0 - 0.1 K/uL  Troponin I     Status: None   Collection Time: 05/30/16 12:30 PM  Result Value Ref Range   Troponin I <0.03 <0.03 ng/mL  CBG monitoring, ED     Status: Abnormal   Collection Time: 05/30/16 12:37 PM  Result Value Ref Range   Glucose-Capillary 378 (H) 65 - 99 mg/dL  Urinalysis, Routine w reflex microscopic     Status: Abnormal   Collection Time: 05/30/16  1:50 PM  Result Value Ref Range   Color, Urine YELLOW YELLOW   APPearance CLEAR CLEAR   Specific Gravity, Urine 1.017 1.005 - 1.030   pH 6.5 5.0 - 8.0   Glucose, UA >1000 (A) NEGATIVE mg/dL   Hgb urine dipstick NEGATIVE NEGATIVE   Bilirubin Urine NEGATIVE NEGATIVE   Ketones, ur NEGATIVE NEGATIVE  mg/dL   Protein, ur NEGATIVE NEGATIVE mg/dL   Nitrite NEGATIVE NEGATIVE   Leukocytes, UA NEGATIVE NEGATIVE  Urine microscopic-add on     Status: None   Collection Time: 05/30/16  1:50 PM  Result Value Ref Range   Squamous Epithelial / LPF NONE SEEN NONE SEEN   WBC, UA 0-5 0 - 5 WBC/hpf   RBC / HPF 0-5 0 - 5 RBC/hpf   Bacteria, UA  NONE SEEN NONE SEEN   Urine-Other MUCOUS PRESENT   CBG monitoring, ED     Status: Abnormal   Collection Time: 05/30/16  3:37 PM  Result Value Ref Range   Glucose-Capillary 267 (H) 65 - 99 mg/dL  POCT Glucose (Device for Home Use)     Status: Abnormal   Collection Time: 06/04/16  3:43 PM  Result Value Ref Range   Glucose Fasting, POC  70 - 99 mg/dL   POC Glucose 318 (A) 70 - 99 mg/dl  Hemoglobin A1c     Status: Abnormal   Collection Time: 06/04/16  3:53 PM  Result Value Ref Range   Hgb A1c MFr Bld 9.7 (H) 4.6 - 6.5 %    Comment: Glycemic Control Guidelines for People with Diabetes:Non Diabetic:  <6%Goal of Therapy: <7%Additional Action Suggested:  >8%   Comp Met (CMET)     Status: Abnormal   Collection Time: 06/04/16  3:53 PM  Result Value Ref Range   Sodium 132 (L) 135 - 145 mEq/L   Potassium 5.6 (H) 3.5 - 5.1 mEq/L   Chloride 96 96 - 112 mEq/L   CO2 28 19 - 32 mEq/L   Glucose, Bld 305 (H) 70 - 99 mg/dL   BUN 21 6 - 23 mg/dL   Creatinine, Ser 1.35 0.40 - 1.50 mg/dL   Total Bilirubin 0.4 0.2 - 1.2 mg/dL   Alkaline Phosphatase 78 39 - 117 U/L   AST 14 0 - 37 U/L   ALT 15 0 - 53 U/L   Total Protein 7.4 6.0 - 8.3 g/dL   Albumin 4.1 3.5 - 5.2 g/dL   Calcium 9.7 8.4 - 10.5 mg/dL   GFR 52.70 (L) >60.00 mL/min    Assessment/Plan: 1. Hyperlipidemia Will obtain fasting lipid panel today.  - Lipid Profile  2. Uncontrolled type 2 diabetes mellitus without complication, without long-term current use of insulin ([place]) Will repeat BMP. If renal function is stable, will increase Metformin to 1000 mg BID. Continue dietary changes. FU scheduled. - Basic  metabolic panel  3. Encounter for immunization Flu shot given today.  - Flu Vaccine QUAD 36+ mos IM   Leeanne Rio, PA-C

## 2016-06-21 NOTE — Progress Notes (Signed)
Pre visit review using our clinic review tool, if applicable. No additional management support is needed unless otherwise documented below in the visit note/SLS  

## 2016-06-24 DIAGNOSIS — I480 Paroxysmal atrial fibrillation: Secondary | ICD-10-CM | POA: Diagnosis not present

## 2016-06-24 DIAGNOSIS — E1165 Type 2 diabetes mellitus with hyperglycemia: Secondary | ICD-10-CM | POA: Diagnosis not present

## 2016-06-24 DIAGNOSIS — R339 Retention of urine, unspecified: Secondary | ICD-10-CM | POA: Diagnosis not present

## 2016-06-24 DIAGNOSIS — F0390 Unspecified dementia without behavioral disturbance: Secondary | ICD-10-CM | POA: Diagnosis not present

## 2016-06-24 DIAGNOSIS — R296 Repeated falls: Secondary | ICD-10-CM | POA: Diagnosis not present

## 2016-06-24 DIAGNOSIS — I10 Essential (primary) hypertension: Secondary | ICD-10-CM | POA: Diagnosis not present

## 2016-06-24 NOTE — Telephone Encounter (Signed)
Please see mychart message. Pt had lipids done. Results in Epic. Please advise.

## 2016-06-26 ENCOUNTER — Encounter: Payer: Self-pay | Admitting: Physician Assistant

## 2016-06-26 DIAGNOSIS — E1165 Type 2 diabetes mellitus with hyperglycemia: Secondary | ICD-10-CM | POA: Diagnosis not present

## 2016-06-26 DIAGNOSIS — I10 Essential (primary) hypertension: Secondary | ICD-10-CM | POA: Diagnosis not present

## 2016-06-26 DIAGNOSIS — I480 Paroxysmal atrial fibrillation: Secondary | ICD-10-CM | POA: Diagnosis not present

## 2016-06-26 DIAGNOSIS — F0390 Unspecified dementia without behavioral disturbance: Secondary | ICD-10-CM | POA: Diagnosis not present

## 2016-06-26 DIAGNOSIS — R296 Repeated falls: Secondary | ICD-10-CM | POA: Diagnosis not present

## 2016-06-26 DIAGNOSIS — R339 Retention of urine, unspecified: Secondary | ICD-10-CM | POA: Diagnosis not present

## 2016-06-27 ENCOUNTER — Telehealth: Payer: Self-pay | Admitting: Physician Assistant

## 2016-06-27 DIAGNOSIS — E785 Hyperlipidemia, unspecified: Secondary | ICD-10-CM | POA: Diagnosis not present

## 2016-06-27 DIAGNOSIS — Z7984 Long term (current) use of oral hypoglycemic drugs: Secondary | ICD-10-CM | POA: Diagnosis not present

## 2016-06-27 DIAGNOSIS — I10 Essential (primary) hypertension: Secondary | ICD-10-CM | POA: Diagnosis not present

## 2016-06-27 DIAGNOSIS — Z9181 History of falling: Secondary | ICD-10-CM | POA: Diagnosis not present

## 2016-06-27 DIAGNOSIS — Z79899 Other long term (current) drug therapy: Secondary | ICD-10-CM | POA: Diagnosis not present

## 2016-06-27 DIAGNOSIS — F0390 Unspecified dementia without behavioral disturbance: Secondary | ICD-10-CM | POA: Diagnosis not present

## 2016-06-27 DIAGNOSIS — E119 Type 2 diabetes mellitus without complications: Secondary | ICD-10-CM | POA: Diagnosis not present

## 2016-06-27 DIAGNOSIS — K219 Gastro-esophageal reflux disease without esophagitis: Secondary | ICD-10-CM | POA: Diagnosis not present

## 2016-06-27 DIAGNOSIS — I480 Paroxysmal atrial fibrillation: Secondary | ICD-10-CM | POA: Diagnosis not present

## 2016-06-27 DIAGNOSIS — Z7982 Long term (current) use of aspirin: Secondary | ICD-10-CM | POA: Diagnosis not present

## 2016-06-27 DIAGNOSIS — R278 Other lack of coordination: Secondary | ICD-10-CM | POA: Diagnosis not present

## 2016-06-27 NOTE — Telephone Encounter (Signed)
OK to give verbal order 

## 2016-06-27 NOTE — Telephone Encounter (Signed)
Caller name:[facility] [employer] Relation to [id number] giver Call back [place]:  Reason for call: requesting to continue physical therapy for 2x a week for 6 weeks to work on balance gait training.  Requesting verbal order.

## 2016-06-28 NOTE — Telephone Encounter (Signed)
Caller informed, Ok per provider/SLS 10/06

## 2016-07-02 DIAGNOSIS — R278 Other lack of coordination: Secondary | ICD-10-CM | POA: Diagnosis not present

## 2016-07-02 DIAGNOSIS — K219 Gastro-esophageal reflux disease without esophagitis: Secondary | ICD-10-CM | POA: Diagnosis not present

## 2016-07-02 DIAGNOSIS — F0390 Unspecified dementia without behavioral disturbance: Secondary | ICD-10-CM | POA: Diagnosis not present

## 2016-07-02 DIAGNOSIS — E119 Type 2 diabetes mellitus without complications: Secondary | ICD-10-CM | POA: Diagnosis not present

## 2016-07-02 DIAGNOSIS — I480 Paroxysmal atrial fibrillation: Secondary | ICD-10-CM | POA: Diagnosis not present

## 2016-07-02 DIAGNOSIS — I10 Essential (primary) hypertension: Secondary | ICD-10-CM | POA: Diagnosis not present

## 2016-07-03 ENCOUNTER — Encounter: Payer: Self-pay | Admitting: Physician Assistant

## 2016-07-04 DIAGNOSIS — I480 Paroxysmal atrial fibrillation: Secondary | ICD-10-CM | POA: Diagnosis not present

## 2016-07-04 DIAGNOSIS — K219 Gastro-esophageal reflux disease without esophagitis: Secondary | ICD-10-CM | POA: Diagnosis not present

## 2016-07-04 DIAGNOSIS — I10 Essential (primary) hypertension: Secondary | ICD-10-CM | POA: Diagnosis not present

## 2016-07-04 DIAGNOSIS — Z79899 Other long term (current) drug therapy: Secondary | ICD-10-CM | POA: Diagnosis not present

## 2016-07-04 DIAGNOSIS — E119 Type 2 diabetes mellitus without complications: Secondary | ICD-10-CM | POA: Diagnosis not present

## 2016-07-04 DIAGNOSIS — F0390 Unspecified dementia without behavioral disturbance: Secondary | ICD-10-CM | POA: Diagnosis not present

## 2016-07-04 DIAGNOSIS — R278 Other lack of coordination: Secondary | ICD-10-CM | POA: Diagnosis not present

## 2016-07-08 DIAGNOSIS — I10 Essential (primary) hypertension: Secondary | ICD-10-CM | POA: Diagnosis not present

## 2016-07-08 DIAGNOSIS — F0390 Unspecified dementia without behavioral disturbance: Secondary | ICD-10-CM | POA: Diagnosis not present

## 2016-07-08 DIAGNOSIS — K219 Gastro-esophageal reflux disease without esophagitis: Secondary | ICD-10-CM | POA: Diagnosis not present

## 2016-07-08 DIAGNOSIS — E119 Type 2 diabetes mellitus without complications: Secondary | ICD-10-CM | POA: Diagnosis not present

## 2016-07-08 DIAGNOSIS — I480 Paroxysmal atrial fibrillation: Secondary | ICD-10-CM | POA: Diagnosis not present

## 2016-07-08 DIAGNOSIS — R278 Other lack of coordination: Secondary | ICD-10-CM | POA: Diagnosis not present

## 2016-07-11 DIAGNOSIS — F0390 Unspecified dementia without behavioral disturbance: Secondary | ICD-10-CM | POA: Diagnosis not present

## 2016-07-11 DIAGNOSIS — R278 Other lack of coordination: Secondary | ICD-10-CM | POA: Diagnosis not present

## 2016-07-11 DIAGNOSIS — K219 Gastro-esophageal reflux disease without esophagitis: Secondary | ICD-10-CM | POA: Diagnosis not present

## 2016-07-11 DIAGNOSIS — E119 Type 2 diabetes mellitus without complications: Secondary | ICD-10-CM | POA: Diagnosis not present

## 2016-07-11 DIAGNOSIS — I480 Paroxysmal atrial fibrillation: Secondary | ICD-10-CM | POA: Diagnosis not present

## 2016-07-11 DIAGNOSIS — I10 Essential (primary) hypertension: Secondary | ICD-10-CM | POA: Diagnosis not present

## 2016-07-15 DIAGNOSIS — I480 Paroxysmal atrial fibrillation: Secondary | ICD-10-CM | POA: Diagnosis not present

## 2016-07-15 DIAGNOSIS — I10 Essential (primary) hypertension: Secondary | ICD-10-CM | POA: Diagnosis not present

## 2016-07-15 DIAGNOSIS — F0390 Unspecified dementia without behavioral disturbance: Secondary | ICD-10-CM | POA: Diagnosis not present

## 2016-07-15 DIAGNOSIS — E119 Type 2 diabetes mellitus without complications: Secondary | ICD-10-CM | POA: Diagnosis not present

## 2016-07-15 DIAGNOSIS — K219 Gastro-esophageal reflux disease without esophagitis: Secondary | ICD-10-CM | POA: Diagnosis not present

## 2016-07-15 DIAGNOSIS — R278 Other lack of coordination: Secondary | ICD-10-CM | POA: Diagnosis not present

## 2016-07-18 DIAGNOSIS — R278 Other lack of coordination: Secondary | ICD-10-CM | POA: Diagnosis not present

## 2016-07-18 DIAGNOSIS — E119 Type 2 diabetes mellitus without complications: Secondary | ICD-10-CM | POA: Diagnosis not present

## 2016-07-18 DIAGNOSIS — K219 Gastro-esophageal reflux disease without esophagitis: Secondary | ICD-10-CM | POA: Diagnosis not present

## 2016-07-18 DIAGNOSIS — I10 Essential (primary) hypertension: Secondary | ICD-10-CM | POA: Diagnosis not present

## 2016-07-18 DIAGNOSIS — I480 Paroxysmal atrial fibrillation: Secondary | ICD-10-CM | POA: Diagnosis not present

## 2016-07-18 DIAGNOSIS — F0390 Unspecified dementia without behavioral disturbance: Secondary | ICD-10-CM | POA: Diagnosis not present

## 2016-07-21 DIAGNOSIS — F0390 Unspecified dementia without behavioral disturbance: Secondary | ICD-10-CM | POA: Diagnosis not present

## 2016-07-21 DIAGNOSIS — R278 Other lack of coordination: Secondary | ICD-10-CM | POA: Diagnosis not present

## 2016-07-21 DIAGNOSIS — E119 Type 2 diabetes mellitus without complications: Secondary | ICD-10-CM | POA: Diagnosis not present

## 2016-07-21 DIAGNOSIS — I10 Essential (primary) hypertension: Secondary | ICD-10-CM | POA: Diagnosis not present

## 2016-07-21 DIAGNOSIS — I480 Paroxysmal atrial fibrillation: Secondary | ICD-10-CM | POA: Diagnosis not present

## 2016-07-21 DIAGNOSIS — K219 Gastro-esophageal reflux disease without esophagitis: Secondary | ICD-10-CM | POA: Diagnosis not present

## 2016-07-22 ENCOUNTER — Encounter: Payer: Self-pay | Admitting: Physician Assistant

## 2016-07-22 DIAGNOSIS — R339 Retention of urine, unspecified: Secondary | ICD-10-CM | POA: Diagnosis not present

## 2016-07-23 DIAGNOSIS — R7989 Other specified abnormal findings of blood chemistry: Secondary | ICD-10-CM | POA: Diagnosis not present

## 2016-07-23 DIAGNOSIS — I480 Paroxysmal atrial fibrillation: Secondary | ICD-10-CM | POA: Diagnosis not present

## 2016-07-23 DIAGNOSIS — I10 Essential (primary) hypertension: Secondary | ICD-10-CM | POA: Diagnosis not present

## 2016-07-23 DIAGNOSIS — F0390 Unspecified dementia without behavioral disturbance: Secondary | ICD-10-CM | POA: Diagnosis not present

## 2016-07-23 DIAGNOSIS — R419 Unspecified symptoms and signs involving cognitive functions and awareness: Secondary | ICD-10-CM | POA: Diagnosis not present

## 2016-07-23 DIAGNOSIS — K219 Gastro-esophageal reflux disease without esophagitis: Secondary | ICD-10-CM | POA: Diagnosis not present

## 2016-07-23 DIAGNOSIS — F0280 Dementia in other diseases classified elsewhere without behavioral disturbance: Secondary | ICD-10-CM | POA: Diagnosis not present

## 2016-07-23 DIAGNOSIS — E089 Diabetes mellitus due to underlying condition without complications: Secondary | ICD-10-CM | POA: Diagnosis not present

## 2016-07-23 DIAGNOSIS — R278 Other lack of coordination: Secondary | ICD-10-CM | POA: Diagnosis not present

## 2016-07-23 DIAGNOSIS — E119 Type 2 diabetes mellitus without complications: Secondary | ICD-10-CM | POA: Diagnosis not present

## 2016-07-24 DIAGNOSIS — R419 Unspecified symptoms and signs involving cognitive functions and awareness: Secondary | ICD-10-CM | POA: Diagnosis not present

## 2016-07-24 DIAGNOSIS — F0280 Dementia in other diseases classified elsewhere without behavioral disturbance: Secondary | ICD-10-CM | POA: Diagnosis not present

## 2016-07-24 DIAGNOSIS — R7989 Other specified abnormal findings of blood chemistry: Secondary | ICD-10-CM | POA: Diagnosis not present

## 2016-07-24 DIAGNOSIS — E089 Diabetes mellitus due to underlying condition without complications: Secondary | ICD-10-CM | POA: Diagnosis not present

## 2016-07-30 DIAGNOSIS — K219 Gastro-esophageal reflux disease without esophagitis: Secondary | ICD-10-CM | POA: Diagnosis not present

## 2016-07-30 DIAGNOSIS — I10 Essential (primary) hypertension: Secondary | ICD-10-CM | POA: Diagnosis not present

## 2016-07-30 DIAGNOSIS — R278 Other lack of coordination: Secondary | ICD-10-CM | POA: Diagnosis not present

## 2016-07-30 DIAGNOSIS — I480 Paroxysmal atrial fibrillation: Secondary | ICD-10-CM | POA: Diagnosis not present

## 2016-07-30 DIAGNOSIS — F0390 Unspecified dementia without behavioral disturbance: Secondary | ICD-10-CM | POA: Diagnosis not present

## 2016-07-30 DIAGNOSIS — E119 Type 2 diabetes mellitus without complications: Secondary | ICD-10-CM | POA: Diagnosis not present

## 2016-07-31 ENCOUNTER — Encounter: Payer: Self-pay | Admitting: Physician Assistant

## 2016-08-01 DIAGNOSIS — Z79899 Other long term (current) drug therapy: Secondary | ICD-10-CM | POA: Diagnosis not present

## 2016-08-02 DIAGNOSIS — E119 Type 2 diabetes mellitus without complications: Secondary | ICD-10-CM | POA: Diagnosis not present

## 2016-08-02 DIAGNOSIS — I10 Essential (primary) hypertension: Secondary | ICD-10-CM | POA: Diagnosis not present

## 2016-08-02 DIAGNOSIS — R278 Other lack of coordination: Secondary | ICD-10-CM | POA: Diagnosis not present

## 2016-08-02 DIAGNOSIS — I480 Paroxysmal atrial fibrillation: Secondary | ICD-10-CM | POA: Diagnosis not present

## 2016-08-02 DIAGNOSIS — F0390 Unspecified dementia without behavioral disturbance: Secondary | ICD-10-CM | POA: Diagnosis not present

## 2016-08-02 DIAGNOSIS — K219 Gastro-esophageal reflux disease without esophagitis: Secondary | ICD-10-CM | POA: Diagnosis not present

## 2016-08-05 DIAGNOSIS — I10 Essential (primary) hypertension: Secondary | ICD-10-CM | POA: Diagnosis not present

## 2016-08-05 DIAGNOSIS — I480 Paroxysmal atrial fibrillation: Secondary | ICD-10-CM | POA: Diagnosis not present

## 2016-08-05 DIAGNOSIS — K219 Gastro-esophageal reflux disease without esophagitis: Secondary | ICD-10-CM | POA: Diagnosis not present

## 2016-08-05 DIAGNOSIS — R278 Other lack of coordination: Secondary | ICD-10-CM | POA: Diagnosis not present

## 2016-08-05 DIAGNOSIS — F0390 Unspecified dementia without behavioral disturbance: Secondary | ICD-10-CM | POA: Diagnosis not present

## 2016-08-05 DIAGNOSIS — E119 Type 2 diabetes mellitus without complications: Secondary | ICD-10-CM | POA: Diagnosis not present

## 2016-08-06 ENCOUNTER — Encounter: Payer: Self-pay | Admitting: Podiatry

## 2016-08-06 ENCOUNTER — Ambulatory Visit (INDEPENDENT_AMBULATORY_CARE_PROVIDER_SITE_OTHER): Payer: Medicare Other | Admitting: Podiatry

## 2016-08-06 DIAGNOSIS — I739 Peripheral vascular disease, unspecified: Secondary | ICD-10-CM | POA: Diagnosis not present

## 2016-08-06 DIAGNOSIS — B351 Tinea unguium: Secondary | ICD-10-CM

## 2016-08-06 DIAGNOSIS — M79674 Pain in right toe(s): Secondary | ICD-10-CM | POA: Diagnosis not present

## 2016-08-06 DIAGNOSIS — M79675 Pain in left toe(s): Secondary | ICD-10-CM | POA: Diagnosis not present

## 2016-08-06 NOTE — Progress Notes (Signed)
Subjective: 82 y.o. returns the office today for painful, elongated, thickened toenails which he cannot trim himself. Denies any redness or drainage around the nails. Denies any acute changes since last appointment and no new complaints today. Denies any systemic complaints such as fevers, chills, nausea, vomiting.   Objective: AAO 3, NAD DP/PT pulses palpable 1/4, CRT less than 3 seconds Nails hypertrophic, dystrophic, elongated, brittle, discolored 10. There is tenderness overlying the nails 1-5 bilaterally. There is no surrounding erythema or drainage along the nail sites. No open lesions or pre-ulcerative lesions are identified. No other areas of tenderness bilateral lower extremities. No overlying edema, erythema, increased warmth. No pain with calf compression, swelling, warmth, erythema.  Assessment: Patient presents with symptomatic onychomycosis  Plan: -Treatment options including alternatives, risks, complications were discussed -Nails sharply debrided 10 without complication/bleeding. -Discussed daily foot inspection. If there are any changes, to call the office immediately.  -Follow-up in 4 months or sooner if any problems are to arise. In the meantime, encouraged to call the office with any questions, concerns, changes symptoms.  Celesta Gentile, DPM

## 2016-08-08 DIAGNOSIS — E119 Type 2 diabetes mellitus without complications: Secondary | ICD-10-CM | POA: Diagnosis not present

## 2016-08-08 DIAGNOSIS — I10 Essential (primary) hypertension: Secondary | ICD-10-CM | POA: Diagnosis not present

## 2016-08-08 DIAGNOSIS — I480 Paroxysmal atrial fibrillation: Secondary | ICD-10-CM | POA: Diagnosis not present

## 2016-08-08 DIAGNOSIS — K219 Gastro-esophageal reflux disease without esophagitis: Secondary | ICD-10-CM | POA: Diagnosis not present

## 2016-08-08 DIAGNOSIS — R278 Other lack of coordination: Secondary | ICD-10-CM | POA: Diagnosis not present

## 2016-08-08 DIAGNOSIS — F0390 Unspecified dementia without behavioral disturbance: Secondary | ICD-10-CM | POA: Diagnosis not present

## 2016-08-20 ENCOUNTER — Other Ambulatory Visit: Payer: Self-pay | Admitting: Emergency Medicine

## 2016-08-20 ENCOUNTER — Encounter: Payer: Self-pay | Admitting: Physician Assistant

## 2016-08-21 ENCOUNTER — Other Ambulatory Visit: Payer: Self-pay | Admitting: Emergency Medicine

## 2016-08-21 MED ORDER — METFORMIN HCL ER 500 MG PO TB24: ORAL_TABLET | ORAL | 1 refills | Status: DC

## 2016-08-23 ENCOUNTER — Encounter: Payer: Self-pay | Admitting: Physician Assistant

## 2016-08-23 ENCOUNTER — Ambulatory Visit (INDEPENDENT_AMBULATORY_CARE_PROVIDER_SITE_OTHER): Payer: Medicare Other | Admitting: Physician Assistant

## 2016-08-23 VITALS — BP 121/73 | HR 61 | Temp 98.1°F | Resp 16 | Ht 67.0 in | Wt 160.1 lb

## 2016-08-23 DIAGNOSIS — E1165 Type 2 diabetes mellitus with hyperglycemia: Secondary | ICD-10-CM

## 2016-08-23 DIAGNOSIS — E039 Hypothyroidism, unspecified: Secondary | ICD-10-CM

## 2016-08-23 DIAGNOSIS — IMO0001 Reserved for inherently not codable concepts without codable children: Secondary | ICD-10-CM

## 2016-08-23 LAB — TSH: TSH: 2.92 u[IU]/mL (ref 0.35–4.50)

## 2016-08-23 MED ORDER — GLIPIZIDE ER 2.5 MG PO TB24: 2.5000 mg | ORAL_TABLET | Freq: Every day | ORAL | 0 refills | Status: DC

## 2016-08-23 NOTE — Patient Instructions (Signed)
Please go to the lab for blood work. I will call with results.  Please continue chronic medications. Please add on the Glucotrol-XL daily. Check BP at home. If you note any low BP on medication, please stop and call me. Continue checking fasting sugars. Goal is 130-150 for now. Do not skip meals while on these medications.  Follow-up with specialists as scheduled. Follow-up with me in 3 weeks.

## 2016-08-23 NOTE — Progress Notes (Signed)
Pre visit review using our clinic review tool, if applicable. No additional management support is needed unless otherwise documented below in the visit note. 

## 2016-08-26 ENCOUNTER — Telehealth: Payer: Self-pay | Admitting: Physician Assistant

## 2016-08-26 NOTE — Progress Notes (Signed)
Patient presents to clinic today with his wife for follow-up of Diabetes Mellitus II and to discuss recent lab results from Neurology.   Patient is currently on regimen of Metformin 1000 mg BID. Taking as directed without side effects. Endorses fasting sugars are averaging 180-200. Denies polyuria, polydipsia or polyphagia. They are working hard on keeping a better, low carb diet.     Lab Results  Component Value Date   HGBA1C 9.7 (H) 06/04/2016   Recent labs obtained by Neurology on 07/24/16 shows A1C at 8.4. Which is an improvement.  Renal function has remained stable on review of these lab results with creatinine at 0.99 and BUN at 14.   Past Medical History:  Diagnosis Date  . A-fib ([place])   . Cataracts, bilateral   . Chronic dermatitis   . Diabetes type 2, controlled ([place]) 2000  . History of chicken pox   . Mumps    Adult  . Retinopathy   . Stroke [facility]) Nov 2011  . Undescended testicle, unilateral    Right  . Whooping cough     Current Outpatient Prescriptions on File Prior to Visit  Medication Sig Dispense Refill  . aspirin 81 MG tablet Take 1 tablet (81 mg total) by mouth daily.    . cyanocobalamin (,VITAMIN B-12,) 1000 MCG/ML injection Inject 1000 mcg intramuscular daily for 7 days, then once a week for 4 weeks, then once a month for a year (Patient taking differently: every 30 (thirty) days. Inject 1000 mcg intramuscular daily for 7 days, then once a week for 4 weeks, then once a month for a year) 25 mL 0  . docusate sodium (COLACE) 100 MG capsule Take 200 mg by mouth.     . Eyelid Cleansers (STERILID EX) Apply topically. Eye Wash: Once Daily    . folic acid (FOLVITE) 1 MG tablet Take 1 mg by mouth daily.    Marland Kitchen levothyroxine (SYNTHROID, LEVOTHROID) 50 MCG tablet Take 1 tablet (50 mcg total) by mouth daily. 90 tablet 3  . lisinopril (PRINIVIL,ZESTRIL) 2.5 MG tablet Take 1 tablet (2.5 mg total) by mouth daily. 90 tablet 3  . metFORMIN (GLUCOPHAGE-XR) 500 MG 24 hr tablet  Take 2 tablets with breakfast and at bedtime 360 tablet 1  . methotrexate (RHEUMATREX) 2.5 MG tablet Take 7.5 mg by mouth once a week. Caution:Chemotherapy. Protect from light.    . metoprolol tartrate (LOPRESSOR) 25 MG tablet Take 0.5 tablets (12.5 mg total) by mouth 2 (two) times daily. 180 tablet 1  . NEEDLE, DISP, 25 G 25G X 1-1/2" MISC 1 each by Does not apply route as directed. Use to draw and inject B12 50 each 0  . Omega-3 Fatty Acids (SB OMEGA-3 FISH OIL) 1000 MG CAPS Take 1 capsule by mouth 2 (two) times daily. Reported on 10/06/2015    . OVER THE COUNTER MEDICATION Take 1 each by mouth 2 (two) times daily. Focus Select Supplement: Vitamin C & E, Zinc & Copper    . Syringe, Disposable, 3 ML MISC 1 each by Does not apply route as directed. 25 each 0  . triamcinolone cream (KENALOG) 0.1 % Apply 1 application topically as needed.     . clonazePAM (KLONOPIN) 0.5 MG tablet Take 1 tablet (0.5 mg total) by mouth 2 (two) times daily as needed (agitation and insomnia). 60 tablet 1   No current facility-administered medications on file prior to visit.     Allergies  Allergen Reactions  . Flomax [Tamsulosin Hcl] Other (See Comments)  Hypotension  . Shellfish Allergy Anaphylaxis  . Aspirin-Dipyridamole Er     Headaches   . Other Other (See Comments)    Blood Thinner given in Rehab gave H/As - Not Coumadin/Headaches      Family History  Problem Relation Age of Onset  . Pneumonia Mother 87    Deceased  . GI Bleed Father 5    Deceased - Ulcers  . Diabetes Cousin   . Diabetes Brother   . Cancer Paternal Aunt     Social History   Social History  . Marital status: Married    Spouse name: N/A  . Number of children: N/A  . Years of education: N/A   Social History Main Topics  . Smoking status: Former Research scientist (life sciences)  . Smokeless tobacco: Never Used     Comment: Hasn't smoked since age 66  . Alcohol use No  . Drug use: No  . Sexual activity: Not Asked   Other Topics Concern  .  None   Social History Narrative   Lives with wife in a one story home.  Has 1 child.  Retired Armed forces technical officer.  Education: Oceanographer.      Review of Systems - See HPI.  All other ROS are negative.  BP 121/73   Pulse 61   Temp 98.1 F (36.7 C) (Oral)   Resp 16   Ht _0  (1.702 m)   Wt 160 lb 2 oz (72.6 kg)   SpO2 98%   BMI 25.08 kg/m   Physical Exam  Constitutional: He is oriented to person, place, and time and well-developed, well-nourished, and in no distress.  HENT:  Head: Normocephalic and atraumatic.  Eyes: Conjunctivae are normal.  Neck: Neck supple.  Cardiovascular: Normal rate, regular rhythm, normal heart sounds and intact distal pulses.   Pulmonary/Chest: Effort normal and breath sounds normal. No respiratory distress. He has no wheezes. He has no rales. He exhibits no tenderness.  Neurological: He is alert and oriented to person, place, and time.  Skin: Skin is warm and dry. No rash noted.  Psychiatric: Affect normal.  Vitals reviewed.  Recent Results (from the past 2160 hour(s))  Basic metabolic panel     Status: Abnormal   Collection Time: 06/21/16 11:12 AM  Result Value Ref Range   Sodium 135 135 - 145 mEq/L   Potassium 4.3 3.5 - 5.1 mEq/L   Chloride 98 96 - 112 mEq/L   CO2 31 19 - 32 mEq/L   Glucose, Bld 189 (H) 70 - 99 mg/dL   BUN 19 6 - 23 mg/dL   Creatinine, Ser 0.97 0.40 - 1.50 mg/dL   Calcium 9.4 8.4 - 10.5 mg/dL   GFR 77.17 >60.00 mL/min  Lipid Profile     Status: Abnormal   Collection Time: 06/21/16 11:12 AM  Result Value Ref Range   Cholesterol 163 0 - 200 mg/dL    Comment: ATP III Classification       Desirable:  < 200 mg/dL               Borderline High:  200 - 239 mg/dL          High:  > = 240 mg/dL   Triglycerides 153.0 (H) 0.0 - 149.0 mg/dL    Comment: Normal:  <150 mg/dLBorderline High:  150 - 199 mg/dL   HDL 39.90 >39.00 mg/dL   VLDL 30.6 0.0 - 40.0 mg/dL   LDL Cholesterol 92 0 - 99 mg/dL   Total CHOL/HDL Ratio 4  Comment:                 Men          Women1/2 Average Risk     3.4          3.3Average Risk          5.0          4.42X Average Risk          9.6          7.13X Average Risk          15.0          11.0                       NonHDL 122.74     Comment: NOTE:  Non-HDL goal should be 30 mg/dL higher than patient's LDL goal (i.e. LDL goal of < 70 mg/dL, would have non-HDL goal of < 100 mg/dL)  TSH     Status: None   Collection Time: 08/23/16  1:22 PM  Result Value Ref Range   TSH 2.92 0.35 - 4.50 uIU/mL  Lipid panel     Status: Abnormal   Collection Time: 09/13/16 12:21 PM  Result Value Ref Range   Cholesterol 91 0 - 200 mg/dL    Comment: ATP III Classification       Desirable:  < 200 mg/dL               Borderline High:  200 - 239 mg/dL          High:  > = 240 mg/dL   Triglycerides 88.0 0.0 - 149.0 mg/dL    Comment: Normal:  <150 mg/dLBorderline High:  150 - 199 mg/dL   HDL 31.30 (L) >39.00 mg/dL   VLDL 17.6 0.0 - 40.0 mg/dL   LDL Cholesterol 42 0 - 99 mg/dL   Total CHOL/HDL Ratio 3     Comment:                Men          Women1/2 Average Risk     3.4          3.3Average Risk          5.0          4.42X Average Risk          9.6          7.13X Average Risk          15.0          11.0                       NonHDL 59.92     Comment: NOTE:  Non-HDL goal should be 30 mg/dL higher than patient's LDL goal (i.e. LDL goal of < 70 mg/dL, would have non-HDL goal of < 100 mg/dL)  Comp Met (CMET)     Status: Abnormal   Collection Time: 09/13/16 12:21 PM  Result Value Ref Range   Sodium 136 135 - 145 mEq/L   Potassium 5.4 (H) 3.5 - 5.1 mEq/L   Chloride 100 96 - 112 mEq/L   CO2 28 19 - 32 mEq/L   Glucose, Bld 133 (H) 70 - 99 mg/dL   BUN 20 6 - 23 mg/dL   Creatinine, Ser 1.13 0.40 - 1.50 mg/dL   Total Bilirubin 0.3 0.2 - 1.2 mg/dL   Alkaline Phosphatase 54 39 - 117  U/L   AST 30 0 - 37 U/L   ALT 39 0 - 53 U/L   Total Protein 6.7 6.0 - 8.3 g/dL   Albumin 4.0 3.5 - 5.2 g/dL   Calcium 9.6 8.4 - 10.5 mg/dL   GFR 64.67  >60.00 mL/min    Assessment/Plan: Diabetes mellitus type II, uncontrolled Will add on Glucotrol XL 2.5 mg to current regimen. Continue Metformin and dietary changes. Close monitoring of fasting sugars. Record. FU 2 weeks.   Hypothyroidism Due for repeat TSH check.     Leeanne Rio, PA-C

## 2016-08-26 NOTE — Telephone Encounter (Signed)
Please call patient and wife Gerald Holt to assess fasting sugar levels after medication change at visit this past week.

## 2016-08-26 NOTE — Telephone Encounter (Signed)
LMOVM for return call with fasting blood sugar readings

## 2016-08-27 ENCOUNTER — Encounter: Payer: Self-pay | Admitting: Physician Assistant

## 2016-08-27 NOTE — Telephone Encounter (Signed)
[place] sent a Mychart message for recent blood sugar readings. Spouse response better by Mychart.

## 2016-08-28 NOTE — Telephone Encounter (Signed)
Reached patient via [place]. See Encounter 08/28/16

## 2016-08-28 NOTE — Telephone Encounter (Signed)
Patient spouse responded by Mychart message

## 2016-09-04 ENCOUNTER — Encounter: Payer: Self-pay | Admitting: Physician Assistant

## 2016-09-05 DIAGNOSIS — L2084 Intrinsic (allergic) eczema: Secondary | ICD-10-CM | POA: Diagnosis not present

## 2016-09-05 DIAGNOSIS — L299 Pruritus, unspecified: Secondary | ICD-10-CM | POA: Diagnosis not present

## 2016-09-05 DIAGNOSIS — Z9225 Personal history of immunosupression therapy: Secondary | ICD-10-CM | POA: Diagnosis not present

## 2016-09-05 DIAGNOSIS — Z79899 Other long term (current) drug therapy: Secondary | ICD-10-CM | POA: Diagnosis not present

## 2016-09-11 ENCOUNTER — Encounter: Payer: Self-pay | Admitting: Physician Assistant

## 2016-09-13 ENCOUNTER — Ambulatory Visit (INDEPENDENT_AMBULATORY_CARE_PROVIDER_SITE_OTHER): Payer: Medicare Other | Admitting: Physician Assistant

## 2016-09-13 ENCOUNTER — Encounter: Payer: Self-pay | Admitting: Physician Assistant

## 2016-09-13 VITALS — BP 122/60 | HR 63 | Temp 97.5°F | Resp 14 | Ht 67.0 in | Wt 160.0 lb

## 2016-09-13 DIAGNOSIS — E1165 Type 2 diabetes mellitus with hyperglycemia: Secondary | ICD-10-CM | POA: Diagnosis not present

## 2016-09-13 DIAGNOSIS — IMO0001 Reserved for inherently not codable concepts without codable children: Secondary | ICD-10-CM

## 2016-09-13 LAB — COMPREHENSIVE METABOLIC PANEL
ALK PHOS: 54 U/L (ref 39–117)
ALT: 39 U/L (ref 0–53)
AST: 30 U/L (ref 0–37)
Albumin: 4 g/dL (ref 3.5–5.2)
BILIRUBIN TOTAL: 0.3 mg/dL (ref 0.2–1.2)
BUN: 20 mg/dL (ref 6–23)
CALCIUM: 9.6 mg/dL (ref 8.4–10.5)
CO2: 28 meq/L (ref 19–32)
CREATININE: 1.13 mg/dL (ref 0.40–1.50)
Chloride: 100 mEq/L (ref 96–112)
GFR: 64.67 mL/min (ref >60.00)
GLUCOSE: 133 mg/dL — AB (ref 70–99)
Potassium: 5.4 mEq/L — ABNORMAL HIGH (ref 3.5–5.1)
Sodium: 136 mEq/L (ref 135–145)
TOTAL PROTEIN: 6.7 g/dL (ref 6.0–8.3)

## 2016-09-13 LAB — LIPID PANEL
CHOL/HDL RATIO: 3
CHOLESTEROL: 91 mg/dL (ref 0–200)
HDL: 31.3 mg/dL — AB (ref >39.00)
LDL Cholesterol: 42 mg/dL (ref 0–99)
NonHDL: 59.92
TRIGLYCERIDES: 88 mg/dL (ref 0.0–149.0)
VLDL: 17.6 mg/dL (ref 0.0–40.0)

## 2016-09-13 NOTE — Progress Notes (Signed)
Pre visit review using our clinic review tool, if applicable. No additional management support is needed unless otherwise documented below in the visit note. 

## 2016-09-13 NOTE — Patient Instructions (Signed)
Please go to the lab for blood work. I will call you with your results.  Please continue medications as directed. I am checking kidney and liver function today. If stable, we will proceed with increasing the Glucotrol.  Follow-up with specialists as scheduled. Follow-up with me in 1 month.

## 2016-09-13 NOTE — Progress Notes (Signed)
Patient presents to clinic today for follow-up of diabetes mellitus II, after adding Glucotrol XL 2.5 mg to current regimen of Metformin 1000 mg BID. Patient and wife endorse patient is taking medication as directed. Endorses fasting sugars have dropped from mid 200s to 170s. Are trying to be more consistent with diet. Deny low blood sugar readings. Denies dizziness, nausea, vomiting or change to mental status. Continued all other medications as directed.   Past Medical History:  Diagnosis Date  . A-fib ([place])   . Cataracts, bilateral   . Chronic dermatitis   . Diabetes type 2, controlled ([place]) 2000  . History of chicken pox   . Mumps    Adult  . Retinopathy   . Stroke [facility]) Nov 2011  . Undescended testicle, unilateral    Right  . Whooping cough     Current Outpatient Prescriptions on File Prior to Visit  Medication Sig Dispense Refill  . aspirin 81 MG tablet Take 1 tablet (81 mg total) by mouth daily.    . Calcium Carbonate Antacid (ALKA-SELTZER ANTACID PO) Take by mouth.    . clonazePAM (KLONOPIN) 0.5 MG tablet Take 1 tablet (0.5 mg total) by mouth 2 (two) times daily as needed (agitation and insomnia). 60 tablet 1  . cyanocobalamin (,VITAMIN B-12,) 1000 MCG/ML injection Inject 1000 mcg intramuscular daily for 7 days, then once a week for 4 weeks, then once a month for a year (Patient taking differently: every 30 (thirty) days. Inject 1000 mcg intramuscular daily for 7 days, then once a week for 4 weeks, then once a month for a year) 25 mL 0  . docusate sodium (COLACE) 100 MG capsule Take 200 mg by mouth.     . Eyelid Cleansers (STERILID EX) Apply topically. Eye Wash: Once Daily    . folic acid (FOLVITE) 1 MG tablet Take 1 mg by mouth daily.    Marland Kitchen glipiZIDE (GLUCOTROL XL) 2.5 MG 24 hr tablet Take 1 tablet (2.5 mg total) by mouth daily with breakfast. 30 tablet 0  . levothyroxine (SYNTHROID, LEVOTHROID) 50 MCG tablet Take 1 tablet (50 mcg total) by mouth daily. 90 tablet 3  .  lisinopril (PRINIVIL,ZESTRIL) 2.5 MG tablet Take 1 tablet (2.5 mg total) by mouth daily. 90 tablet 3  . loratadine (CLARITIN) 10 MG tablet Take 10 mg by mouth daily.    Marland Kitchen lovastatin (MEVACOR) 10 MG tablet Take 10 mg by mouth at bedtime.     . metFORMIN (GLUCOPHAGE-XR) 500 MG 24 hr tablet Take 2 tablets with breakfast and at bedtime 360 tablet 1  . methotrexate (RHEUMATREX) 2.5 MG tablet Take 7.5 mg by mouth once a week. Caution:Chemotherapy. Protect from light.    . metoprolol tartrate (LOPRESSOR) 25 MG tablet Take 0.5 tablets (12.5 mg total) by mouth 2 (two) times daily. 180 tablet 1  . Miconazole Nitrate (TRIPLE PASTE AF) 2 % OINT Apply topically.    Marland Kitchen NEEDLE, DISP, 25 G 25G X 1-1/2" MISC 1 each by Does not apply route as directed. Use to draw and inject B12 50 each 0  . Omega-3 Fatty Acids (SB OMEGA-3 FISH OIL) 1000 MG CAPS Take 1 capsule by mouth 2 (two) times daily. Reported on 10/06/2015    . OVER THE COUNTER MEDICATION Take 1 each by mouth 2 (two) times daily. Focus Select Supplement: Vitamin C & E, Zinc & Copper    . Syringe, Disposable, 3 ML MISC 1 each by Does not apply route as directed. 25 each 0  .  triamcinolone cream (KENALOG) 0.1 % Apply 1 application topically as needed.      No current facility-administered medications on file prior to visit.     Allergies  Allergen Reactions  . Flomax [Tamsulosin Hcl] Other (See Comments)    Hypotension  . Shellfish Allergy Anaphylaxis  . Aspirin-Dipyridamole Er     Headaches   . Other Other (See Comments)    Blood Thinner given in Rehab gave H/As - Not Coumadin/Headaches      Family History  Problem Relation Age of Onset  . Pneumonia Mother 45    Deceased  . GI Bleed Father 52    Deceased - Ulcers  . Diabetes Cousin   . Diabetes Brother   . Cancer Paternal Aunt     Social History   Social History  . Marital status: Married    Spouse name: N/A  . Number of children: N/A  . Years of education: N/A   Social History Main  Topics  . Smoking status: Former Research scientist (life sciences)  . Smokeless tobacco: Never Used     Comment: Hasn't smoked since age 73  . Alcohol use No  . Drug use: No  . Sexual activity: Not Asked   Other Topics Concern  . None   Social History Narrative   Lives with wife in a one story home.  Has 1 child.  Retired Armed forces technical officer.  Education: Oceanographer.      Review of Systems - See HPI.  All other ROS are negative.  BP 122/60   Pulse 63   Temp 97.5 F (36.4 C) (Oral)   Resp 14   Ht '5\' 7"'$  (1.702 m)   Wt 160 lb (72.6 kg)   SpO2 96%   BMI 25.06 kg/m   Physical Exam  Constitutional: He is oriented to person, place, and time and well-developed, well-nourished, and in no distress.  HENT:  Head: Normocephalic and atraumatic.  Eyes: Conjunctivae are normal.  Neck: Neck supple.  Cardiovascular: Normal rate, regular rhythm, normal heart sounds and intact distal pulses.   Pulmonary/Chest: Effort normal and breath sounds normal. No respiratory distress. He has no wheezes. He has no rales. He exhibits no tenderness.  Neurological: He is alert and oriented to person, place, and time.  Skin: Skin is warm and dry. No rash noted.  Psychiatric: Affect normal.  Vitals reviewed.   Recent Results (from the past 2160 hour(s))  Basic metabolic panel     Status: Abnormal   Collection Time: 06/21/16 11:12 AM  Result Value Ref Range   Sodium 135 135 - 145 mEq/L   Potassium 4.3 3.5 - 5.1 mEq/L   Chloride 98 96 - 112 mEq/L   CO2 31 19 - 32 mEq/L   Glucose, Bld 189 (H) 70 - 99 mg/dL   BUN 19 6 - 23 mg/dL   Creatinine, Ser 0.97 0.40 - 1.50 mg/dL   Calcium 9.4 8.4 - 10.5 mg/dL   GFR 77.17 >60.00 mL/min  Lipid Profile     Status: Abnormal   Collection Time: 06/21/16 11:12 AM  Result Value Ref Range   Cholesterol 163 0 - 200 mg/dL    Comment: ATP III Classification       Desirable:  < 200 mg/dL               Borderline High:  200 - 239 mg/dL          High:  > = 240 mg/dL   Triglycerides 153.0 (H) 0.0 - 149.0  mg/dL  Comment: Normal:  <150 mg/dLBorderline High:  150 - 199 mg/dL   HDL 39.90 >39.00 mg/dL   VLDL 30.6 0.0 - 40.0 mg/dL   LDL Cholesterol 92 0 - 99 mg/dL   Total CHOL/HDL Ratio 4     Comment:                Men          Women1/2 Average Risk     3.4          3.3Average Risk          5.0          4.42X Average Risk          9.6          7.13X Average Risk          15.0          11.0                       NonHDL 122.74     Comment: NOTE:  Non-HDL goal should be 30 mg/dL higher than patient's LDL goal (i.e. LDL goal of < 70 mg/dL, would have non-HDL goal of < 100 mg/dL)  TSH     Status: None   Collection Time: 08/23/16  1:22 PM  Result Value Ref Range   TSH 2.92 0.35 - 4.50 uIU/mL  Lipid panel     Status: Abnormal   Collection Time: 09/13/16 12:21 PM  Result Value Ref Range   Cholesterol 91 0 - 200 mg/dL    Comment: ATP III Classification       Desirable:  < 200 mg/dL               Borderline High:  200 - 239 mg/dL          High:  > = 240 mg/dL   Triglycerides 88.0 0.0 - 149.0 mg/dL    Comment: Normal:  <150 mg/dLBorderline High:  150 - 199 mg/dL   HDL 31.30 (L) >39.00 mg/dL   VLDL 17.6 0.0 - 40.0 mg/dL   LDL Cholesterol 42 0 - 99 mg/dL   Total CHOL/HDL Ratio 3     Comment:                Men          Women1/2 Average Risk     3.4          3.3Average Risk          5.0          4.42X Average Risk          9.6          7.13X Average Risk          15.0          11.0                       NonHDL 59.92     Comment: NOTE:  Non-HDL goal should be 30 mg/dL higher than patient's LDL goal (i.e. LDL goal of < 70 mg/dL, would have non-HDL goal of < 100 mg/dL)  Comp Met (CMET)     Status: Abnormal   Collection Time: 09/13/16 12:21 PM  Result Value Ref Range   Sodium 136 135 - 145 mEq/L   Potassium 5.4 (H) 3.5 - 5.1 mEq/L   Chloride 100 96 - 112 mEq/L   CO2 28 19 - 32 mEq/L  Glucose, Bld 133 (H) 70 - 99 mg/dL   BUN 20 6 - 23 mg/dL   Creatinine, Ser 1.13 0.40 - 1.50 mg/dL   Total Bilirubin  0.3 0.2 - 1.2 mg/dL   Alkaline Phosphatase 54 39 - 117 U/L   AST 30 0 - 37 U/L   ALT 39 0 - 53 U/L   Total Protein 6.7 6.0 - 8.3 g/dL   Albumin 4.0 3.5 - 5.2 g/dL   Calcium 9.6 8.4 - 10.5 mg/dL   GFR 64.67 >60.00 mL/min    Assessment/Plan: Diabetes mellitus type II, uncontrolled Sugar level improving with Glucotrol XL. Consistent diet. No hypoglycemic events. Will reassess renal function. If stable will increase Glucotrol XL to 5 mg. FU 1 month.     Leeanne Rio, PA-C

## 2016-09-14 NOTE — Assessment & Plan Note (Signed)
Sugar level improving with Glucotrol XL. Consistent diet. No hypoglycemic events. Will reassess renal function. If stable will increase Glucotrol XL to 5 mg. FU 1 month.

## 2016-09-15 DIAGNOSIS — E039 Hypothyroidism, unspecified: Secondary | ICD-10-CM | POA: Insufficient documentation

## 2016-09-15 NOTE — Assessment & Plan Note (Signed)
Will add on Glucotrol XL 2.5 mg to current regimen. Continue Metformin and dietary changes. Close monitoring of fasting sugars. Record. FU 2 weeks.

## 2016-09-15 NOTE — Assessment & Plan Note (Signed)
Due for repeat TSH check.

## 2016-09-23 ENCOUNTER — Encounter: Payer: Self-pay | Admitting: Physician Assistant

## 2016-09-25 ENCOUNTER — Ambulatory Visit (INDEPENDENT_AMBULATORY_CARE_PROVIDER_SITE_OTHER): Payer: Medicare Other | Admitting: Neurology

## 2016-09-25 ENCOUNTER — Encounter: Payer: Self-pay | Admitting: Neurology

## 2016-09-25 VITALS — BP 132/60 | HR 75 | Ht 67.0 in | Wt 161.9 lb

## 2016-09-25 DIAGNOSIS — F0150 Vascular dementia without behavioral disturbance: Secondary | ICD-10-CM

## 2016-09-25 DIAGNOSIS — Z8673 Personal history of transient ischemic attack (TIA), and cerebral infarction without residual deficits: Secondary | ICD-10-CM

## 2016-09-25 DIAGNOSIS — E119 Type 2 diabetes mellitus without complications: Secondary | ICD-10-CM

## 2016-09-25 DIAGNOSIS — I4891 Unspecified atrial fibrillation: Secondary | ICD-10-CM

## 2016-09-25 DIAGNOSIS — Z794 Long term (current) use of insulin: Secondary | ICD-10-CM

## 2016-09-25 DIAGNOSIS — E538 Deficiency of other specified B group vitamins: Secondary | ICD-10-CM

## 2016-09-25 MED ORDER — GLIPIZIDE ER 2.5 MG PO TB24
2.5000 mg | ORAL_TABLET | Freq: Every day | ORAL | 5 refills | Status: DC
Start: 2016-09-25 — End: 2016-10-17

## 2016-09-25 NOTE — Addendum Note (Signed)
Addended by: Raiford Noble on: 09/25/2016 07:55 AM   Modules accepted: Orders

## 2016-09-25 NOTE — Progress Notes (Signed)
NEUROLOGY FOLLOW UP OFFICE NOTE  Gerald Holt [id number]  HISTORY OF PRESENT ILLNESS: Gerald Holt is a 83 year old man with type 2 diabetes, a-fib, and history of stroke who follows up for vascular dementia.  He is accompanied by his wife who supplements history.  UPDATE: Sed rate from 03/25/16 was 15. B12 from 03/25/16 was 149.  He began supplementation and repeat 05/21/16 was 1056.  He was also found to have a UTI and was treated. TSH from 08/23/16 was 2.92. Hgb A1c from September was 9.7 He presented to the ED on 05/30/16 for left arm weakness and numbness, slurred speech and feeling foggy in the head.  Serum glucose was 386.  UA was negative.  CT of head was personally reviewed and revealed a left parietal meningioma but no acute abnormalities.  He later had an MRI of the brain on 06/08/16, which was personally reviewed and revealed moderate atrophy and chronic small vessel disease and a left parietal meningioma, stable but no acute/subacute stroke or bleed.  TIA was presumed.  He is on antiplatelet therapy and lovastatin 10mg .  LDL from 09/13/16 was 42.  Since treatment of UTI and B12, he has had improvement in cognition.  He is able to play the piano although still slow to process.  Short term memory is an issue.  He started a memory program based out of [place].  Genetic testing reportedly showed he was negative for APOE.  He was advised to start supplements and to stop statin therapy (or at least have LDL be around 70).  HISTORY: Since February 2017, he has had a progressive decline in cognition.  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  He plays piano daily.  He started having trouble remembering which pieces he played and sometimes how to play them.  He also has left the stove on and water running.  He started having trouble getting dress and has put on his shirt incorrectly.  He stopped driving in April.  He has had 8 falls over the past couple of years.  His last fall was  about 6 weeks ago, in which he hit his head and sustained a concussion.  He has been receiving PT.  He uses a cane but it was recommended to use a walker.  He has not had any hallucinations or difficulty recognizing friends and family.  He started having trouble sleeping over the past couple of weeks.  He naps during the day.  At night, he will lay in bed but he does not get agitated or confused.  Sometimes he wakes up confused.  Over the past month, he has been incontinent to urine.  He is hard of hearing and has a audiology test in 2 weeks.    There is no family history of dementia, but his family passed away at young age.  PAST MEDICAL HISTORY: Past Medical History:  Diagnosis Date  . A-fib ([place])   . Cataracts, bilateral   . Chronic dermatitis   . Diabetes type 2, controlled ([place]) 2000  . History of chicken pox   . Mumps    Adult  . Retinopathy   . Stroke [facility]) Nov 2011  . Undescended testicle, unilateral    Right  . Whooping cough     MEDICATIONS: Current Outpatient Prescriptions on File Prior to Visit  Medication Sig Dispense Refill  . aspirin 81 MG tablet Take 1 tablet (81 mg total) by mouth daily.    . Calcium Carbonate Antacid (ALKA-SELTZER  ANTACID PO) Take by mouth.    . clonazePAM (KLONOPIN) 0.5 MG tablet Take 1 tablet (0.5 mg total) by mouth 2 (two) times daily as needed (agitation and insomnia). 60 tablet 1  . cyanocobalamin (,VITAMIN B-12,) 1000 MCG/ML injection Inject 1000 mcg intramuscular daily for 7 days, then once a week for 4 weeks, then once a month for a year (Patient taking differently: every 30 (thirty) days. Inject 1000 mcg intramuscular daily for 7 days, then once a week for 4 weeks, then once a month for a year) 25 mL 0  . docusate sodium (COLACE) 100 MG capsule Take 200 mg by mouth.     . Eyelid Cleansers (STERILID EX) Apply topically. Eye Wash: Once Daily    . folic acid (FOLVITE) 1 MG tablet Take 1 mg by mouth daily.    Marland Kitchen glipiZIDE (GLUCOTROL XL) 2.5 MG 24  hr tablet Take 1 tablet (2.5 mg total) by mouth daily with breakfast. 30 tablet 5  . levothyroxine (SYNTHROID, LEVOTHROID) 50 MCG tablet Take 1 tablet (50 mcg total) by mouth daily. 90 tablet 3  . lisinopril (PRINIVIL,ZESTRIL) 2.5 MG tablet Take 1 tablet (2.5 mg total) by mouth daily. 90 tablet 3  . loratadine (CLARITIN) 10 MG tablet Take 10 mg by mouth daily.    Marland Kitchen lovastatin (MEVACOR) 10 MG tablet Take 10 mg by mouth at bedtime.     . metFORMIN (GLUCOPHAGE-XR) 500 MG 24 hr tablet Take 2 tablets with breakfast and at bedtime (Patient taking differently: 2,000 mg. Take 2 tablets with breakfast and at bedtime) 360 tablet 1  . methotrexate (RHEUMATREX) 2.5 MG tablet Take 7.5 mg by mouth once a week. Caution:Chemotherapy. Protect from light.    . metoprolol tartrate (LOPRESSOR) 25 MG tablet Take 0.5 tablets (12.5 mg total) by mouth 2 (two) times daily. 180 tablet 1  . Miconazole Nitrate (TRIPLE PASTE AF) 2 % OINT Apply topically.    Marland Kitchen NEEDLE, DISP, 25 G 25G X 1-1/2" MISC 1 each by Does not apply route as directed. Use to draw and inject B12 50 each 0  . Omega-3 Fatty Acids (SB OMEGA-3 FISH OIL) 1000 MG CAPS Take 1 capsule by mouth 2 (two) times daily. Reported on 10/06/2015    . OVER THE COUNTER MEDICATION Take 1 each by mouth 2 (two) times daily. Focus Select Supplement: Vitamin C & E, Zinc & Copper    . Syringe, Disposable, 3 ML MISC 1 each by Does not apply route as directed. 25 each 0  . triamcinolone cream (KENALOG) 0.1 % Apply 1 application topically as needed.      No current facility-administered medications on file prior to visit.     ALLERGIES: Allergies  Allergen Reactions  . Flomax [Tamsulosin Hcl] Other (See Comments)    Hypotension  . Shellfish Allergy Anaphylaxis  . Aspirin-Dipyridamole Er     Headaches   . Other Other (See Comments)    Blood Thinner given in Rehab gave H/As - Not Coumadin/Headaches      FAMILY HISTORY: Family History  Problem Relation Age of Onset  .  Pneumonia Mother 42    Deceased  . GI Bleed Father 7    Deceased - Ulcers  . Diabetes Cousin   . Diabetes Brother   . Cancer Paternal Aunt     SOCIAL HISTORY: Social History   Social History  . Marital status: Married    Spouse name: N/A  . Number of children: N/A  . Years of education: N/A  Occupational History  . Not on file.   Social History Main Topics  . Smoking status: Former Research scientist (life sciences)  . Smokeless tobacco: Never Used     Comment: Hasn't smoked since age 83  . Alcohol use No  . Drug use: No  . Sexual activity: Not on file   Other Topics Concern  . Not on file   Social History Narrative   Lives with wife in a one story home.  Has 1 child.  Retired Armed forces technical officer.  Education: Oceanographer.      REVIEW OF SYSTEMS: Constitutional: No fevers, chills, or sweats, no generalized fatigue, change in appetite Eyes: No visual changes, double vision, eye pain Ear, nose and throat: No hearing loss, ear pain, nasal congestion, sore throat Cardiovascular: No chest pain, palpitations Respiratory:  No shortness of breath at rest or with exertion, wheezes GastrointestinaI: No nausea, vomiting, diarrhea, abdominal pain, fecal incontinence Genitourinary:  No dysuria, urinary retention or frequency Musculoskeletal:  No neck pain, back pain Integumentary: No rash, pruritus, skin lesions Neurological: as above Psychiatric: No depression, insomnia, anxiety Endocrine: No palpitations, fatigue, diaphoresis, mood swings, change in appetite, change in weight, increased thirst Hematologic/Lymphatic:  No purpura, petechiae. Allergic/Immunologic: no itchy/runny eyes, nasal congestion, recent allergic reactions, rashes  PHYSICAL EXAM: Vitals:   09/25/16 1414  BP: 132/60  Pulse: 75   General: No acute distress.  Patient appears well-groomed.   Head:  Normocephalic/atraumatic Eyes:  Fundi examined but not visualized Neck: supple, no paraspinal tenderness, full range of motion Heart:   Regular rate and rhythm Lungs:  Clear to auscultation bilaterally Back: No paraspinal tenderness Neurological Exam: alert and oriented to person, state (not county or city), and time including month, year and day (not date). Attention span and concentration fair, recent memory poor, remote memory intact, fund of knowledge intact.  Exhibits visuospatial deficits.  Speech fluent and not dysarthric, language intact.   MMSE - Mini Mental State Exam 09/25/2016 03/25/2016  Orientation to time 4 2  Orientation to Place 3 2  Registration 3 3  Attention/ Calculation 3 0  Recall 1 3  Language- name 2 objects 2 2  Language- repeat 1 1  Language- follow 3 step command 3 3  Language- read & follow direction 1 1  Write a sentence 1 0  Copy design 0 0  Total score 22 17   CN II-XII intact. Bulk and tone normal, muscle strength 5/5 throughout.  Finger to nose testing intact.  Gait normal  IMPRESSION: Vascular dementia.  Alzheimer's is unlikely at this point. B12 deficiency Cerebrovascular disease with history of stroke and TIAs Type 2 diabetes  PLAN: 1.  Continue ASA for secondary stroke prevention 2.  Continue B12 supplementation 3.  My recommendation is to continue statin therapy as it is indicated for secondary stroke prevention.  For secondary stroke prevention, LDL should be less than 70.  However, I suggested we can switch lovastatin 10mg  to simvastatin 10mg  at bedtime and recheck lipid panel in 2 months.  I have no problem with it being around 70, but I think he should be on some statin (even a very low dose). 4.  Optimize glycemic control. 5.  Follow up in 6 months.  27 minutes spent face to face with patient and his wife, over 50% spent discussing etiology of cognitive impairment, management and risks and benefits of taking statin.  Metta Clines, DO  CC: Leeanne Rio, PA-C

## 2016-09-25 NOTE — Patient Instructions (Signed)
1.  In my opinion, I would favor continuing statin therapy.  However, we can try changing lovastatin to simvastatin 5mg  daily and repeat fasting lipid panel in 2 months. 2.  Continue B12 supplementation 3.  Continue brain therapy 4.  Follow up in 6 months.

## 2016-09-27 ENCOUNTER — Encounter: Payer: Self-pay | Admitting: Physician Assistant

## 2016-09-27 DIAGNOSIS — R339 Retention of urine, unspecified: Secondary | ICD-10-CM | POA: Diagnosis not present

## 2016-10-01 ENCOUNTER — Encounter (HOSPITAL_BASED_OUTPATIENT_CLINIC_OR_DEPARTMENT_OTHER): Payer: Self-pay | Admitting: *Deleted

## 2016-10-01 ENCOUNTER — Emergency Department (HOSPITAL_BASED_OUTPATIENT_CLINIC_OR_DEPARTMENT_OTHER)
Admission: EM | Admit: 2016-10-01 | Discharge: 2016-10-01 | Disposition: A | Discharge status: Home / Self Care | Payer: Medicare Other | Attending: Emergency Medicine | Admitting: Emergency Medicine

## 2016-10-01 DIAGNOSIS — E119 Type 2 diabetes mellitus without complications: Secondary | ICD-10-CM | POA: Insufficient documentation

## 2016-10-01 DIAGNOSIS — Z87891 Personal history of nicotine dependence: Secondary | ICD-10-CM | POA: Diagnosis not present

## 2016-10-01 DIAGNOSIS — Z79899 Other long term (current) drug therapy: Secondary | ICD-10-CM | POA: Diagnosis not present

## 2016-10-01 DIAGNOSIS — E039 Hypothyroidism, unspecified: Secondary | ICD-10-CM | POA: Insufficient documentation

## 2016-10-01 DIAGNOSIS — Z7982 Long term (current) use of aspirin: Secondary | ICD-10-CM | POA: Diagnosis not present

## 2016-10-01 DIAGNOSIS — N3001 Acute cystitis with hematuria: Secondary | ICD-10-CM | POA: Diagnosis not present

## 2016-10-01 DIAGNOSIS — Z7984 Long term (current) use of oral hypoglycemic drugs: Secondary | ICD-10-CM | POA: Insufficient documentation

## 2016-10-01 DIAGNOSIS — R319 Hematuria, unspecified: Secondary | ICD-10-CM | POA: Diagnosis present

## 2016-10-01 LAB — CBC WITH DIFFERENTIAL/PLATELET
BASOS PCT: 0 %
Basophils Absolute: 0.1 10*3/uL (ref 0.0–0.1)
EOS PCT: 6 %
Eosinophils Absolute: 0.8 10*3/uL — ABNORMAL HIGH (ref 0.0–0.7)
HEMATOCRIT: 34.2 % — AB (ref 39.0–52.0)
Hemoglobin: 11.4 g/dL — ABNORMAL LOW (ref 13.0–17.0)
Lymphocytes Relative: 15 %
Lymphs Abs: 2 10*3/uL (ref 0.7–4.0)
MCH: 30.2 pg (ref 26.0–34.0)
MCHC: 33.3 g/dL (ref 30.0–36.0)
MCV: 90.7 fL (ref 78.0–100.0)
MONO ABS: 1 10*3/uL (ref 0.1–1.0)
Monocytes Relative: 7 %
NEUTROS ABS: 9.4 10*3/uL — AB (ref 1.7–7.7)
Neutrophils Relative %: 72 %
PLATELETS: 205 10*3/uL (ref 150–400)
RBC: 3.77 MIL/uL — ABNORMAL LOW (ref 4.22–5.81)
RDW: 14.4 % (ref 11.5–15.5)
WBC: 13.3 10*3/uL — ABNORMAL HIGH (ref 4.0–10.5)

## 2016-10-01 LAB — URINALYSIS, MICROSCOPIC (REFLEX)

## 2016-10-01 LAB — BASIC METABOLIC PANEL
ANION GAP: 10 (ref 5–15)
BUN: 29 mg/dL — ABNORMAL HIGH (ref 6–20)
CALCIUM: 9.5 mg/dL (ref 8.9–10.3)
CO2: 26 mmol/L (ref 22–32)
Chloride: 98 mmol/L — ABNORMAL LOW (ref 101–111)
Creatinine, Ser: 1.03 mg/dL (ref 0.61–1.24)
GFR calc Af Amer: >60 mL/min (ref >60)
Glucose, Bld: 144 mg/dL — ABNORMAL HIGH (ref 65–99)
Potassium: 4.7 mmol/L (ref 3.5–5.1)
SODIUM: 134 mmol/L — AB (ref 135–145)

## 2016-10-01 LAB — URINALYSIS, ROUTINE W REFLEX MICROSCOPIC
GLUCOSE, UA: NEGATIVE mg/dL
KETONES UR: 15 mg/dL — AB
NITRITE: POSITIVE — AB
Specific Gravity, Urine: 1.02 (ref 1.005–1.030)
pH: 7 (ref 5.0–8.0)

## 2016-10-01 MED ORDER — PHENAZOPYRIDINE HCL 200 MG PO TABS: 200.0000 mg | ORAL_TABLET | Freq: Three times a day (TID) | ORAL | 0 refills | Status: DC

## 2016-10-01 MED ORDER — CEPHALEXIN 500 MG PO CAPS: 500.0000 mg | ORAL_CAPSULE | Freq: Four times a day (QID) | ORAL | 0 refills | Status: DC

## 2016-10-01 MED ORDER — CEPHALEXIN 250 MG PO CAPS
500.0000 mg | ORAL_CAPSULE | Freq: Once | ORAL | Status: AC
  Administered 2016-10-01: 500 mg via ORAL
  Filled 2016-10-01: qty 2

## 2016-10-01 NOTE — Discharge Instructions (Signed)
Take abx as prescribed. Call and discuss with your urologist.

## 2016-10-01 NOTE — ED Notes (Signed)
Family verbalizes understanding of d/c instructions and denies any further needs at this time.

## 2016-10-01 NOTE — ED Provider Notes (Signed)
[place] DEPT [facility] Provider Note   CSN: [id number] Arrival date & time: 10/01/16  2134  By signing my name below, I, Dora Sims, attest that this documentation has been prepared under the direction and in the presence of physician practitioner, Deno Etienne, DO. Electronically Signed: Dora Sims, Scribe. 10/01/2016. 10:03 PM.  History   Chief Complaint Chief Complaint  Patient presents with  . Urinary Retention    The history is provided by the patient. No language interpreter was used.     HPI Comments: Gerald Holt is a 83 y.o. male who presents to the Emergency Department complaining of constant difficulty urinating beginning earlier tonight. Wife reports pt has had difficulty urinating for about 6 months and she has been placing catheters 2-3 times a day with control of his urinary retention. Wife notes pt usually expels 1,000-1,500 cc's of urine, but tonight he only produced ~ 500 cc's. Wife states she attempted to use a larger catheter tonight as well and noticed some pus-like material and blood in his urine. Pt notes associated abdominal distension, urinary urgency, and shaking chills tonight as well. He states that his urgency has improved over the last couple of hours. He denies fever, abdominal pain, or any other associated symptoms. He is followed by urology.  Past Medical History:  Diagnosis Date  . A-fib ([place])   . Cataracts, bilateral   . Chronic dermatitis   . Diabetes type 2, controlled ([place]) 2000  . History of chicken pox   . Mumps    Adult  . Retinopathy   . Stroke [facility]) Nov 2011  . Undescended testicle, unilateral    Right  . Whooping cough     Patient Active Problem List   Diagnosis Date Noted  . Hypothyroidism 09/15/2016  . Leukocytosis 07/18/2015  . Gastroesophageal reflux disease without esophagitis 07/09/2015  . Falls 06/22/2015  . Diabetes mellitus type II, uncontrolled ([place]) 05/29/2015  . Paroxysmal atrial fibrillation ([place]) 05/29/2015    . Hyperlipidemia 05/29/2015  . Hx of completed stroke 05/29/2015    Past Surgical History:  Procedure Laterality Date  . PRE-MALIGNANT / BENIGN SKIN LESION EXCISION    . TESTICLE SURGERY     Right, undescended  . TOOTH EXTRACTION         Home Medications    Prior to Admission medications   Medication Sig Start Date End Date Taking? Authorizing Provider  aspirin 81 MG tablet Take 1 tablet (81 mg total) by mouth daily. 06/22/15   Jerline Pain, MD  Calcium Carbonate Antacid (ALKA-SELTZER ANTACID PO) Take by mouth.    Historical Provider, MD  cephALEXin (KEFLEX) 500 MG capsule Take 1 capsule (500 mg total) by mouth 4 (four) times daily. 10/01/16   Deno Etienne, DO  clonazePAM (KLONOPIN) 0.5 MG tablet Take 1 tablet (0.5 mg total) by mouth 2 (two) times daily as needed (agitation and insomnia). 04/01/16   [name], PA-C  cyanocobalamin (,VITAMIN B-12,) 1000 MCG/ML injection Inject 1000 mcg intramuscular daily for 7 days, then once a week for 4 weeks, then once a month for a year Patient taking differently: every 30 (thirty) days. Inject 1000 mcg intramuscular daily for 7 days, then once a week for 4 weeks, then once a month for a year 04/02/16   Pieter Partridge, DO  docusate sodium (COLACE) 100 MG capsule Take 200 mg by mouth.     Historical Provider, MD  Eyelid Cleansers (STERILID EX) Apply topically. Eye Wash: Once Daily    Historical Provider,  MD  folic acid (FOLVITE) 1 MG tablet Take 1 mg by mouth daily.    Historical Provider, MD  glipiZIDE (GLUCOTROL XL) 2.5 MG 24 hr tablet Take 1 tablet (2.5 mg total) by mouth daily with breakfast. 09/25/16   [name], PA-C  levothyroxine (SYNTHROID, LEVOTHROID) 50 MCG tablet Take 1 tablet (50 mcg total) by mouth daily. 04/09/16   [name], PA-C  lisinopril (PRINIVIL,ZESTRIL) 2.5 MG tablet Take 1 tablet (2.5 mg total) by mouth daily. 04/09/16   [name], PA-C  loratadine (CLARITIN) 10 MG tablet Take 10 mg by mouth daily.     Historical Provider, MD  lovastatin (MEVACOR) 10 MG tablet Take 10 mg by mouth at bedtime.  08/21/16   Historical Provider, MD  metFORMIN (GLUCOPHAGE-XR) 500 MG 24 hr tablet Take 2 tablets with breakfast and at bedtime Patient taking differently: 2,000 mg. Take 2 tablets with breakfast and at bedtime 08/21/16   [name], PA-C  methotrexate (RHEUMATREX) 2.5 MG tablet Take 7.5 mg by mouth once a week. Caution:Chemotherapy. Protect from light.    Historical Provider, MD  metoprolol tartrate (LOPRESSOR) 25 MG tablet Take 0.5 tablets (12.5 mg total) by mouth 2 (two) times daily. 04/09/16   [name], PA-C  Miconazole Nitrate (TRIPLE PASTE AF) 2 % OINT Apply topically.    Historical Provider, MD  NEEDLE, DISP, 25 G 25G X 1-1/2" MISC 1 each by Does not apply route as directed. Use to draw and inject B12 04/02/16   Pieter Partridge, DO  Omega-3 Fatty Acids (SB OMEGA-3 FISH OIL) 1000 MG CAPS Take 1 capsule by mouth 2 (two) times daily. Reported on 10/06/2015    Historical Provider, MD  OVER THE COUNTER MEDICATION Take 1 each by mouth 2 (two) times daily. Focus Select Supplement: Vitamin C & E, Zinc & Copper    Historical Provider, MD  phenazopyridine (PYRIDIUM) 200 MG tablet Take 1 tablet (200 mg total) by mouth 3 (three) times daily. 10/01/16   Deno Etienne, DO  Syringe, Disposable, 3 ML MISC 1 each by Does not apply route as directed. 04/02/16   Pieter Partridge, DO  triamcinolone cream (KENALOG) 0.1 % Apply 1 application topically as needed.  02/01/16   Historical Provider, MD    Family History Family History  Problem Relation Age of Onset  . Pneumonia Mother 7    Deceased  . GI Bleed Father 97    Deceased - Ulcers  . Diabetes Cousin   . Diabetes Brother   . Cancer Paternal Aunt     Social History Social History  Substance Use Topics  . Smoking status: Former Research scientist (life sciences)  . Smokeless tobacco: Never Used     Comment: Hasn't smoked since age 84  . Alcohol use No     Allergies   Flomax  [tamsulosin hcl]; Shellfish allergy; Aspirin-dipyridamole er; and Other   Review of Systems Review of Systems  Constitutional: Positive for chills. Negative for fever.  Gastrointestinal: Positive for abdominal distention. Negative for abdominal pain.  Genitourinary: Positive for difficulty urinating, hematuria and urgency.  All other systems reviewed and are negative.    Physical Exam Updated Vital Signs BP 159/72 (BP Location: Left Arm)   Pulse 74   Temp 97.6 F (36.4 C) (Oral)   Resp 18   Ht 5\' 6"  (1.676 m)   Wt 161 lb (73 kg)   SpO2 98%   BMI 25.99 kg/m   Physical Exam  Constitutional: He is oriented to  person, place, and time. He appears well-developed and well-nourished.  Well-appearing.  HENT:  Head: Normocephalic and atraumatic.  Eyes: EOM are normal. Pupils are equal, round, and reactive to light.  Neck: Normal range of motion. Neck supple. No JVD present.  Cardiovascular: Normal rate and regular rhythm.  Exam reveals no gallop and no friction rub.   No murmur heard. Pulmonary/Chest: No respiratory distress. He has no wheezes.  Abdominal: He exhibits no distension. There is no rebound and no guarding.  No abdominal pain.  Musculoskeletal: Normal range of motion.  Neurological: He is alert and oriented to person, place, and time.  Skin: No rash noted. No pallor.  Psychiatric: He has a normal mood and affect. His behavior is normal.  Nursing note and vitals reviewed.    ED Treatments / Results  Labs (all labs ordered are listed, but only abnormal results are displayed) Labs Reviewed  URINALYSIS, ROUTINE W REFLEX MICROSCOPIC - Abnormal; Notable for the following:       Result Value   Color, Urine RED (*)    APPearance TURBID (*)    Hgb urine dipstick LARGE (*)    Bilirubin Urine SMALL (*)    Ketones, ur 15 (*)    Protein, ur >300 (*)    Nitrite POSITIVE (*)    Leukocytes, UA LARGE (*)    All other components within normal limits  CBC WITH  DIFFERENTIAL/PLATELET - Abnormal; Notable for the following:    WBC 13.3 (*)    RBC 3.77 (*)    Hemoglobin 11.4 (*)    HCT 34.2 (*)    Neutro Abs 9.4 (*)    Eosinophils Absolute 0.8 (*)    All other components within normal limits  BASIC METABOLIC PANEL - Abnormal; Notable for the following:    Sodium 134 (*)    Chloride 98 (*)    Glucose, Bld 144 (*)    BUN 29 (*)    All other components within normal limits  URINALYSIS, MICROSCOPIC (REFLEX) - Abnormal; Notable for the following:    Bacteria, UA FEW (*)    Squamous Epithelial / LPF 0-5 (*)    All other components within normal limits  URINE CULTURE    EKG  EKG Interpretation None       Radiology No results found.  Procedures Procedures (including critical care time) Emergency Focused Ultrasound Exam Limited ultrasound of the Bladder.   Performed and interpreted by Dr. Tyrone Nine Indication: evaluation for urinary retention Transverse and Sagittal views of the bladder are obtained and calculations are performed to determine an estimated bladder volume for signs of post-renal obstruction.  Findings: Bladder is not full <100cc in the bladder Interpretation: no evidence of outlet obstruction Images archived electronically.  CPT Codes: [id number] and [phone]  DIAGNOSTIC STUDIES: Oxygen Saturation is 98% on RA, normal by my interpretation.    COORDINATION OF CARE: 10:09 PM Discussed treatment plan with pt at bedside and pt agreed to plan.  Medications Ordered in ED Medications - No data to display   Initial Impression / Assessment and Plan / ED Course  I have reviewed the triage vital signs and the nursing notes.  Pertinent labs & imaging results that were available during my care of the patient were reviewed by me and considered in my medical decision making (see chart for details).  Clinical Course     82 yo M With a chief complaints of dysuria and her pubic pain. Started evening. Family member had multiple caths the  final  one ending with some blood. On arrival here his symptoms are significantly improved. No noted urinary retention on bedside ultrasound. Offered a Foley family feels that that would not benefit as the patient usually pulls those out. UA concerning for UTI. Will start on Keflex. Have her discuss with her urologist.  11:29 PM:  I have discussed the diagnosis/risks/treatment options with the patient and family and believe the pt to be eligible for discharge home to follow-up with PCP. We also discussed returning to the ED immediately if new or worsening sx occur. We discussed the sx which are most concerning (e.g., sudden worsening pain, fever, inability to tolerate by mouth) that necessitate immediate return. Medications administered to the patient during their visit and any new prescriptions provided to the patient are listed below.  Medications given during this visit Medications - No data to display   The patient appears reasonably screen and/or stabilized for discharge and I doubt any other medical condition or other [facility] requiring further screening, evaluation, or treatment in the ED at this time prior to discharge.    Final Clinical Impressions(s) / ED Diagnoses   Final diagnoses:  Acute cystitis with hematuria    New Prescriptions New Prescriptions   CEPHALEXIN (KEFLEX) 500 MG CAPSULE    Take 1 capsule (500 mg total) by mouth 4 (four) times daily.   PHENAZOPYRIDINE (PYRIDIUM) 200 MG TABLET    Take 1 tablet (200 mg total) by mouth 3 (three) times daily.    I personally performed the services described in this documentation, which was scribed in my presence. The recorded information has been reviewed and is accurate.    Deno Etienne, DO 10/01/16 2329

## 2016-10-01 NOTE — ED Triage Notes (Addendum)
Wife states she caths pt several times a day and today "urinary rentention" and hematuria, pt states he feels full with urgency .

## 2016-10-02 ENCOUNTER — Encounter: Payer: Self-pay | Admitting: Physician Assistant

## 2016-10-04 LAB — URINE CULTURE: Culture: >=100000 — AB

## 2016-10-05 ENCOUNTER — Telehealth: Payer: Self-pay

## 2016-10-05 NOTE — Telephone Encounter (Signed)
Post ED Visit - Positive Culture Follow-up: Successful Patient Follow-Up  Culture assessed and recommendations reviewed by: []  Elenor Quinones, Pharm.D. []  Heide Guile, Pharm.D., BCPS []  Parks Neptune, Pharm.D. []  Alycia Rossetti, Pharm.D., BCPS []  [place], Pharm.D., BCPS, AAHIVP []  Legrand Como, Pharm.D., BCPS, AAHIVP []  Milus Glazier, Pharm.D. []  Rob Evette Doffing, Pharm.D. Dimitri Ped Pharm D Positive urine culture  []  Patient discharged without antimicrobial prescription and treatment is now indicated [x]  Organism is resistant to prescribed ED discharge antimicrobial []  Patient with positive blood cultures  Changes discussed with ED provider: Janetta Hora PA-C New antibiotic prescription Amoxicillin 500 mg PO BID x 7days Called to [facility] [phone]  Contacted patient, date 10/05/16 , time 1126   Gerald Holt, [place] Comment 10/05/2016, 11:24 AM

## 2016-10-05 NOTE — Progress Notes (Signed)
ED Antimicrobial Stewardship Positive Culture Follow Up   Gerald Holt is an 83 y.o. male who presented to [facility] on 10/01/2016 with a chief complaint of  Chief Complaint  Patient presents with  . Urinary Retention    Recent Results (from the past 720 hour(s))  Urine culture     Status: Abnormal   Collection Time: 10/01/16 10:45 PM  Result Value Ref Range Status   Specimen Description URINE, CATHETERIZED  Final   Special Requests NONE  Final   Culture >=100,000 COLONIES/mL ENTEROCOCCUS FAECALIS (A)  Final   Report Status 10/04/2016 FINAL  Final   Organism ID, Bacteria ENTEROCOCCUS FAECALIS (A)  Final      Susceptibility   Enterococcus faecalis - MIC*    AMPICILLIN <=2 SENSITIVE Sensitive     LEVOFLOXACIN 1 SENSITIVE Sensitive     NITROFURANTOIN <=16 SENSITIVE Sensitive     VANCOMYCIN 1 SENSITIVE Sensitive     * >=100,000 COLONIES/mL ENTEROCOCCUS FAECALIS    [x]  Treated with cephalexin, organism resistant to prescribed antimicrobial   New antibiotic prescription: Stop cephalexin. Start amoxicillin 500 mg BID x 7 days.  ED Provider: Janetta Hora, PA-C  Dimitri Ped, PharmD, BCPS PGY-2 Infectious Diseases Pharmacy Resident Pager: [phone] 10/05/2016, 9:09 AM

## 2016-10-10 ENCOUNTER — Encounter: Payer: Self-pay | Admitting: Physician Assistant

## 2016-10-14 ENCOUNTER — Ambulatory Visit (INDEPENDENT_AMBULATORY_CARE_PROVIDER_SITE_OTHER): Payer: Medicare Other | Admitting: Physician Assistant

## 2016-10-14 ENCOUNTER — Encounter: Payer: Self-pay | Admitting: Physician Assistant

## 2016-10-14 VITALS — BP 118/80 | HR 57 | Temp 97.4°F | Resp 14 | Ht 67.0 in | Wt 159.0 lb

## 2016-10-14 DIAGNOSIS — E119 Type 2 diabetes mellitus without complications: Secondary | ICD-10-CM

## 2016-10-14 DIAGNOSIS — W57XXXA Bitten or stung by nonvenomous insect and other nonvenomous arthropods, initial encounter: Secondary | ICD-10-CM

## 2016-10-14 DIAGNOSIS — N3090 Cystitis, unspecified without hematuria: Secondary | ICD-10-CM | POA: Diagnosis not present

## 2016-10-14 LAB — BASIC METABOLIC PANEL
BUN: 18 mg/dL (ref 6–23)
CALCIUM: 9.7 mg/dL (ref 8.4–10.5)
CHLORIDE: 98 meq/L (ref 96–112)
CO2: 29 meq/L (ref 19–32)
CREATININE: 1.02 mg/dL (ref 0.40–1.50)
GFR: 72.77 mL/min (ref >60.00)
Glucose, Bld: 175 mg/dL — ABNORMAL HIGH (ref 70–99)
Potassium: 5.1 mEq/L (ref 3.5–5.1)
Sodium: 133 mEq/L — ABNORMAL LOW (ref 135–145)

## 2016-10-14 LAB — GLUCOSE, POCT (MANUAL RESULT ENTRY): POC GLUCOSE: 161 mg/dL — AB (ref 70–99)

## 2016-10-14 LAB — HEMOGLOBIN A1C: HEMOGLOBIN A1C: 6.4 % (ref 4.6–6.5)

## 2016-10-14 MED ORDER — GLUCOSE BLOOD VI STRP: ORAL_STRIP | 12 refills | Status: DC

## 2016-10-14 NOTE — Progress Notes (Signed)
Pre visit review using our clinic review tool, if applicable. No additional management support is needed unless otherwise documented below in the visit note. 

## 2016-10-14 NOTE — Patient Instructions (Addendum)
Please wash all bed linens and dry before reuse.  Same with towels/cloths.  Apply over-the-counter hydrocortisone cream to the area 1-2 x day until bites heal.  If not improving or new bites occur, we may have to start a different course of action.   Please continue current diabetes medication regimen. Use the new meter to check fasting sugars daily and record. Call me with numbers in 1 week. Goal 100-150 at present.  We will hold off on changing medications for the time being.   We will call once urine culture result is in. Follow-up with Urology and Infectious Disease as scheduled.  I will review the Neurologist note so we can make a decision regarding cholesterol medication.   Follow-up with me will be based on lab results today.

## 2016-10-14 NOTE — Progress Notes (Signed)
Patient presents to clinic today for follow-up of DM II and for repeat urine culture to confirm resolution of cystitis.   DM II -- Patient currently on Metformin 100 mg BID. Tolerating well with stable renal function. Glucotrol XL 2.5 mg daily recently added due to hyperglycemia. Patient is taking all medications as directed. Wife monitoring fasting glucose levels. Now averaging 160s but is unsure if machine is accurate. Is keeping patient on steady diet and keeping him hydrated.   Cystitis -- Patient denies residual symptoms. Wife is continuing to cath him to empty bladder. Endorses good output. Has catheterized patient at beginning of visit for repeat urine assessment. Has appointment with ID later this week to determine need for prophylactic ABX due to daily cathing.   Patient's wife endorses several bumps of lower back that are itchy, first noticed 3 days ago. Denies pain or drainage. Denies fever, chills or malaise. Denies contact with similar symptoms.     Past Medical History:  Diagnosis Date  . A-fib ([place])   . Cataracts, bilateral   . Chronic dermatitis   . Diabetes type 2, controlled ([place]) 2000  . History of chicken pox   . Mumps    Adult  . Retinopathy   . Stroke [facility]) Nov 2011  . Undescended testicle, unilateral    Right  . Whooping cough     Current Outpatient Prescriptions on File Prior to Visit  Medication Sig Dispense Refill  . aspirin 81 MG tablet Take 1 tablet (81 mg total) by mouth daily.    . Calcium Carbonate Antacid (ALKA-SELTZER ANTACID PO) Take by mouth.    . clonazePAM (KLONOPIN) 0.5 MG tablet Take 1 tablet (0.5 mg total) by mouth 2 (two) times daily as needed (agitation and insomnia). 60 tablet 1  . cyanocobalamin (,VITAMIN B-12,) 1000 MCG/ML injection Inject 1000 mcg intramuscular daily for 7 days, then once a week for 4 weeks, then once a month for a year (Patient taking differently: every 30 (thirty) days. Inject 1000 mcg intramuscular daily for 7 days,  then once a week for 4 weeks, then once a month for a year) 25 mL 0  . docusate sodium (COLACE) 100 MG capsule Take 200 mg by mouth.     . Eyelid Cleansers (STERILID EX) Apply topically. Eye Wash: Once Daily    . folic acid (FOLVITE) 1 MG tablet Take 1 mg by mouth daily.    Marland Kitchen glipiZIDE (GLUCOTROL XL) 2.5 MG 24 hr tablet Take 1 tablet (2.5 mg total) by mouth daily with breakfast. 30 tablet 5  . levothyroxine (SYNTHROID, LEVOTHROID) 50 MCG tablet Take 1 tablet (50 mcg total) by mouth daily. 90 tablet 3  . lisinopril (PRINIVIL,ZESTRIL) 2.5 MG tablet Take 1 tablet (2.5 mg total) by mouth daily. 90 tablet 3  . loratadine (CLARITIN) 10 MG tablet Take 10 mg by mouth daily.    Marland Kitchen lovastatin (MEVACOR) 10 MG tablet Take 10 mg by mouth at bedtime.     . metFORMIN (GLUCOPHAGE-XR) 500 MG 24 hr tablet Take 2 tablets with breakfast and at bedtime (Patient taking differently: 2,000 mg. Take 2 tablets with breakfast and at bedtime) 360 tablet 1  . methotrexate (RHEUMATREX) 2.5 MG tablet Take 10 mg by mouth once a week. Caution:Chemotherapy. Protect from light.     . metoprolol tartrate (LOPRESSOR) 25 MG tablet Take 0.5 tablets (12.5 mg total) by mouth 2 (two) times daily. 180 tablet 1  . Miconazole Nitrate (TRIPLE PASTE AF) 2 % OINT Apply topically.    Marland Kitchen  NEEDLE, DISP, 25 G 25G X 1-1/2" MISC 1 each by Does not apply route as directed. Use to draw and inject B12 50 each 0  . Omega-3 Fatty Acids (SB OMEGA-3 FISH OIL) 1000 MG CAPS Take 1 capsule by mouth 2 (two) times daily. Reported on 10/06/2015    . OVER THE COUNTER MEDICATION Take 1 each by mouth 2 (two) times daily. Focus Select Supplement: Vitamin C & E, Zinc & Copper    . phenazopyridine (PYRIDIUM) 200 MG tablet Take 1 tablet (200 mg total) by mouth 3 (three) times daily. 6 tablet 0  . Syringe, Disposable, 3 ML MISC 1 each by Does not apply route as directed. 25 each 0  . triamcinolone cream (KENALOG) 0.1 % Apply 1 application topically as needed.      No  current facility-administered medications on file prior to visit.     Allergies  Allergen Reactions  . Flomax [Tamsulosin Hcl] Other (See Comments)    Hypotension  . Shellfish Allergy Anaphylaxis  . Aspirin-Dipyridamole Er     Headaches   . Other Other (See Comments)    Blood Thinner given in Rehab gave H/As - Not Coumadin/Headaches      Family History  Problem Relation Age of Onset  . Pneumonia Mother 66    Deceased  . GI Bleed Father 96    Deceased - Ulcers  . Diabetes Cousin   . Diabetes Brother   . Cancer Paternal Aunt     Social History   Social History  . Marital status: Married    Spouse name: N/A  . Number of children: N/A  . Years of education: N/A   Social History Main Topics  . Smoking status: Former Research scientist (life sciences)  . Smokeless tobacco: Never Used     Comment: Hasn't smoked since age 3  . Alcohol use No  . Drug use: No  . Sexual activity: Not Asked   Other Topics Concern  . None   Social History Narrative   Lives with wife in a one story home.  Has 1 child.  Retired Armed forces technical officer.  Education: Oceanographer.      Review of Systems - See HPI.  All other ROS are negative.  BP 118/80   Pulse (!) 57   Temp 97.4 F (36.3 C) (Oral)   Resp 14   Ht '5\' 7"'  (1.702 m)   Wt 159 lb (72.1 kg)   SpO2 93%   BMI 24.90 kg/m   Physical Exam  Constitutional: He is oriented to person, place, and time and well-developed, well-nourished, and in no distress.  HENT:  Head: Normocephalic and atraumatic.  Eyes: Conjunctivae are normal.  Neck: Neck supple.  Cardiovascular: Normal rate, regular rhythm, normal heart sounds and intact distal pulses.   Pulmonary/Chest: Effort normal and breath sounds normal. No respiratory distress. He has no wheezes. He has no rales. He exhibits no tenderness.  Neurological: He is alert and oriented to person, place, and time.  Skin: Skin is warm and dry.     Psychiatric: Affect normal.  Vitals reviewed.   Recent Results (from the past  2160 hour(s))  TSH     Status: None   Collection Time: 08/23/16  1:22 PM  Result Value Ref Range   TSH 2.92 0.35 - 4.50 uIU/mL  Lipid panel     Status: Abnormal   Collection Time: 09/13/16 12:21 PM  Result Value Ref Range   Cholesterol 91 0 - 200 mg/dL    Comment: ATP III Classification  Desirable:  < 200 mg/dL               Borderline High:  200 - 239 mg/dL          High:  > = 240 mg/dL   Triglycerides 88.0 0.0 - 149.0 mg/dL    Comment: Normal:  <150 mg/dLBorderline High:  150 - 199 mg/dL   HDL 31.30 (L) >39.00 mg/dL   VLDL 17.6 0.0 - 40.0 mg/dL   LDL Cholesterol 42 0 - 99 mg/dL   Total CHOL/HDL Ratio 3     Comment:                Men          Women1/2 Average Risk     3.4          3.3Average Risk          5.0          4.42X Average Risk          9.6          7.13X Average Risk          15.0          11.0                       NonHDL 59.92     Comment: NOTE:  Non-HDL goal should be 30 mg/dL higher than patient's LDL goal (i.e. LDL goal of < 70 mg/dL, would have non-HDL goal of < 100 mg/dL)  Comp Met (CMET)     Status: Abnormal   Collection Time: 09/13/16 12:21 PM  Result Value Ref Range   Sodium 136 135 - 145 mEq/L   Potassium 5.4 (H) 3.5 - 5.1 mEq/L   Chloride 100 96 - 112 mEq/L   CO2 28 19 - 32 mEq/L   Glucose, Bld 133 (H) 70 - 99 mg/dL   BUN 20 6 - 23 mg/dL   Creatinine, Ser 1.13 0.40 - 1.50 mg/dL   Total Bilirubin 0.3 0.2 - 1.2 mg/dL   Alkaline Phosphatase 54 39 - 117 U/L   AST 30 0 - 37 U/L   ALT 39 0 - 53 U/L   Total Protein 6.7 6.0 - 8.3 g/dL   Albumin 4.0 3.5 - 5.2 g/dL   Calcium 9.6 8.4 - 10.5 mg/dL   GFR 64.67 >60.00 mL/min  CBC with Differential     Status: Abnormal   Collection Time: 10/01/16 10:20 PM  Result Value Ref Range   WBC 13.3 (H) 4.0 - 10.5 K/uL   RBC 3.77 (L) 4.22 - 5.81 MIL/uL   Hemoglobin 11.4 (L) 13.0 - 17.0 g/dL   HCT 34.2 (L) 39.0 - 52.0 %   MCV 90.7 78.0 - 100.0 fL   MCH 30.2 26.0 - 34.0 pg   MCHC 33.3 30.0 - 36.0 g/dL   RDW 14.4 11.5  - 15.5 %   Platelets 205 150 - 400 K/uL   Neutrophils Relative % 72 %   Neutro Abs 9.4 (H) 1.7 - 7.7 K/uL   Lymphocytes Relative 15 %   Lymphs Abs 2.0 0.7 - 4.0 K/uL   Monocytes Relative 7 %   Monocytes Absolute 1.0 0.1 - 1.0 K/uL   Eosinophils Relative 6 %   Eosinophils Absolute 0.8 (H) 0.0 - 0.7 K/uL   Basophils Relative 0 %   Basophils Absolute 0.1 0.0 - 0.1 K/uL  Basic metabolic panel     Status: Abnormal   Collection  Time: 10/01/16 10:20 PM  Result Value Ref Range   Sodium 134 (L) 135 - 145 mmol/L   Potassium 4.7 3.5 - 5.1 mmol/L   Chloride 98 (L) 101 - 111 mmol/L   CO2 26 22 - 32 mmol/L   Glucose, Bld 144 (H) 65 - 99 mg/dL   BUN 29 (H) 6 - 20 mg/dL   Creatinine, Ser 1.03 0.61 - 1.24 mg/dL   Calcium 9.5 8.9 - 10.3 mg/dL   GFR calc non Af Amer >60 >60 mL/min   GFR calc Af Amer >60 >60 mL/min    Comment: (NOTE) The eGFR has been calculated using the CKD EPI equation. This calculation has not been validated in all clinical situations. eGFR's persistently <60 mL/min signify possible Chronic Kidney Disease.    Anion gap 10 5 - 15  Urinalysis, Routine w reflex microscopic     Status: Abnormal   Collection Time: 10/01/16 10:45 PM  Result Value Ref Range   Color, Urine RED (A) YELLOW    Comment: BIOCHEMICALS MAY BE AFFECTED BY COLOR   APPearance TURBID (A) CLEAR   Specific Gravity, Urine 1.020 1.005 - 1.030   pH 7.0 5.0 - 8.0   Glucose, UA NEGATIVE NEGATIVE mg/dL   Hgb urine dipstick LARGE (A) NEGATIVE   Bilirubin Urine SMALL (A) NEGATIVE   Ketones, ur 15 (A) NEGATIVE mg/dL   Protein, ur >300 (A) NEGATIVE mg/dL   Nitrite POSITIVE (A) NEGATIVE   Leukocytes, UA LARGE (A) NEGATIVE  Urinalysis, Microscopic (reflex)     Status: Abnormal   Collection Time: 10/01/16 10:45 PM  Result Value Ref Range   RBC / HPF TOO NUMEROUS TO COUNT 0 - 5 RBC/hpf   WBC, UA TOO NUMEROUS TO COUNT 0 - 5 WBC/hpf   Bacteria, UA FEW (A) NONE SEEN   Squamous Epithelial / LPF 0-5 (A) NONE SEEN    Urine culture     Status: Abnormal   Collection Time: 10/01/16 10:45 PM  Result Value Ref Range   Specimen Description URINE, CATHETERIZED    Special Requests NONE    Culture >=100,000 COLONIES/mL ENTEROCOCCUS FAECALIS (A)    Report Status 10/04/2016 FINAL    Organism ID, Bacteria ENTEROCOCCUS FAECALIS (A)       Susceptibility   Enterococcus faecalis - MIC*    AMPICILLIN <=2 SENSITIVE Sensitive     LEVOFLOXACIN 1 SENSITIVE Sensitive     NITROFURANTOIN <=16 SENSITIVE Sensitive     VANCOMYCIN 1 SENSITIVE Sensitive     * >=100,000 COLONIES/mL ENTEROCOCCUS FAECALIS    Assessment/Plan: 1. Controlled type 2 diabetes mellitus without complication, without long-term current use of insulin (HCC) Fasting sugars above goal. POC glucose checked today on our machine and patient's machine. Their machine reading 30 points higher. New meter and supplies given. Will repeat labs today. Will leave medication alone until we get a week of fsating sugars with new meter for comparison. - Hemoglobin W0J - Basic metabolic panel - glucose blood test strip; Use one strip daily to check blood sugars for diabetes  Dispense: 100 each; Refill: 12 - POCT Glucose (CBG)  2. Cystitis Resolved clinically. Cath urine sent for repeat culture. FU with Urology and ID as scheduled. - Urine Culture  3. Bug bite, initial encounter Mild. No evidence of infection. Supportive measures and OTC medications reviewed. Discuss cleaning at home to reduce risk of recurrence.    Leeanne Rio, PA-C

## 2016-10-15 ENCOUNTER — Encounter: Payer: Self-pay | Admitting: Physician Assistant

## 2016-10-15 LAB — URINE CULTURE: ORGANISM ID, BACTERIA: NO GROWTH

## 2016-10-16 ENCOUNTER — Encounter: Payer: Self-pay | Admitting: Physician Assistant

## 2016-10-16 DIAGNOSIS — T83510A Infection and inflammatory reaction due to cystostomy catheter, initial encounter: Secondary | ICD-10-CM | POA: Diagnosis not present

## 2016-10-16 DIAGNOSIS — B028 Zoster with other complications: Secondary | ICD-10-CM | POA: Diagnosis not present

## 2016-10-16 DIAGNOSIS — N401 Enlarged prostate with lower urinary tract symptoms: Secondary | ICD-10-CM | POA: Diagnosis not present

## 2016-10-16 DIAGNOSIS — N138 Other obstructive and reflux uropathy: Secondary | ICD-10-CM | POA: Diagnosis not present

## 2016-10-17 ENCOUNTER — Encounter: Payer: Self-pay | Admitting: Physician Assistant

## 2016-10-17 ENCOUNTER — Other Ambulatory Visit (INDEPENDENT_AMBULATORY_CARE_PROVIDER_SITE_OTHER): Payer: Medicare Other

## 2016-10-17 ENCOUNTER — Other Ambulatory Visit: Payer: Self-pay | Admitting: Physician Assistant

## 2016-10-17 DIAGNOSIS — R8290 Unspecified abnormal findings in urine: Secondary | ICD-10-CM | POA: Diagnosis not present

## 2016-10-17 DIAGNOSIS — E119 Type 2 diabetes mellitus without complications: Secondary | ICD-10-CM

## 2016-10-17 MED ORDER — GLIPIZIDE ER 2.5 MG PO TB24: 2.5000 mg | ORAL_TABLET | Freq: Every day | ORAL | 5 refills | Status: DC

## 2016-10-17 MED ORDER — GLUCOSE BLOOD VI STRP: ORAL_STRIP | 12 refills | Status: DC

## 2016-10-18 ENCOUNTER — Encounter: Payer: Self-pay | Admitting: Physician Assistant

## 2016-10-18 ENCOUNTER — Ambulatory Visit (INDEPENDENT_AMBULATORY_CARE_PROVIDER_SITE_OTHER): Payer: Medicare Other | Admitting: Physician Assistant

## 2016-10-18 VITALS — BP 112/60 | HR 68 | Temp 97.6°F | Resp 16 | Ht 67.0 in | Wt 159.0 lb

## 2016-10-18 DIAGNOSIS — Z87440 Personal history of urinary (tract) infections: Secondary | ICD-10-CM

## 2016-10-18 DIAGNOSIS — W19XXXA Unspecified fall, initial encounter: Secondary | ICD-10-CM | POA: Diagnosis not present

## 2016-10-18 DIAGNOSIS — E785 Hyperlipidemia, unspecified: Secondary | ICD-10-CM | POA: Diagnosis not present

## 2016-10-18 DIAGNOSIS — Z5181 Encounter for therapeutic drug level monitoring: Secondary | ICD-10-CM | POA: Diagnosis not present

## 2016-10-18 LAB — CBC WITH DIFFERENTIAL/PLATELET
Basophils Absolute: 0.1 10*3/uL (ref 0.0–0.1)
Basophils Relative: 0.7 % (ref 0.0–3.0)
EOS ABS: 1.1 10*3/uL — AB (ref 0.0–0.7)
EOS PCT: 13.1 % — AB (ref 0.0–5.0)
HCT: 35.4 % — ABNORMAL LOW (ref 39.0–52.0)
Hemoglobin: 11.9 g/dL — ABNORMAL LOW (ref 13.0–17.0)
LYMPHS PCT: 17.9 % (ref 12.0–46.0)
Lymphs Abs: 1.5 10*3/uL (ref 0.7–4.0)
MCHC: 33.7 g/dL (ref 30.0–36.0)
MCV: 89.8 fl (ref 78.0–100.0)
Monocytes Absolute: 0.8 10*3/uL (ref 0.1–1.0)
Monocytes Relative: 9.1 % (ref 3.0–12.0)
NEUTROS ABS: 5.1 10*3/uL (ref 1.4–7.7)
NEUTROS PCT: 59.2 % (ref 43.0–77.0)
PLATELETS: 211 10*3/uL (ref 150.0–400.0)
RBC: 3.94 Mil/uL — ABNORMAL LOW (ref 4.22–5.81)
RDW: 15.9 % — AB (ref 11.5–15.5)
WBC: 8.5 10*3/uL (ref 4.0–10.5)

## 2016-10-18 LAB — HEPATIC FUNCTION PANEL
ALBUMIN: 4 g/dL (ref 3.5–5.2)
ALT: 77 U/L — ABNORMAL HIGH (ref 0–53)
AST: 56 U/L — ABNORMAL HIGH (ref 0–37)
Alkaline Phosphatase: 58 U/L (ref 39–117)
BILIRUBIN DIRECT: 0.1 mg/dL (ref 0.0–0.3)
TOTAL PROTEIN: 6.9 g/dL (ref 6.0–8.3)
Total Bilirubin: 0.5 mg/dL (ref 0.2–1.2)

## 2016-10-18 LAB — URINALYSIS, ROUTINE W REFLEX MICROSCOPIC
Bilirubin Urine: NEGATIVE
Hgb urine dipstick: NEGATIVE
Ketones, ur: NEGATIVE
Nitrite: NEGATIVE
PH: 7 (ref 5.0–8.0)
Total Protein, Urine: NEGATIVE
URINE GLUCOSE: NEGATIVE
Urobilinogen, UA: 0.2 (ref 0.0–1.0)

## 2016-10-18 LAB — URINE CULTURE: ORGANISM ID, BACTERIA: NO GROWTH

## 2016-10-18 LAB — LIPID PANEL
CHOL/HDL RATIO: 4
Cholesterol: 139 mg/dL (ref 0–200)
HDL: 32.9 mg/dL — ABNORMAL LOW (ref >39.00)
LDL CALC: 73 mg/dL (ref 0–99)
NONHDL: 105.65
TRIGLYCERIDES: 161 mg/dL — AB (ref 0.0–149.0)
VLDL: 32.2 mg/dL (ref 0.0–40.0)

## 2016-10-18 LAB — C-REACTIVE PROTEIN: CRP: 0.6 mg/dL (ref 0.5–20.0)

## 2016-10-18 NOTE — Progress Notes (Signed)
Patient presents to clinic today c/o for assessment for UTI. Patient with history of overflow incontinence, followed by Urology. Recently with urinary tract infection s/p treatment with antibiotic and negative repeat culture. Patient is having I/o caths twice daily per Urology. Wife noted some cloudy urine on cathing yesterday. Endorses urine is clear today. Is worried about repeat infection. Patient denies fever, chills, malaise or fatigue. Family denies any change from baseline mental status.   Family requesting referral to home health PT to resume some strengthening exercises for [place] due to deconditioning while recovering from infection.  Patient is unable to safely leave house due to dementia. Is high fall risk and has history of gait instability due to generalized weakness .  Past Medical History:  Diagnosis Date  . A-fib ([place])   . Cataracts, bilateral   . Chronic dermatitis   . Diabetes type 2, controlled ([place]) 2000  . History of chicken pox   . Mumps    Adult  . Retinopathy   . Stroke [facility]) Nov 2011  . Undescended testicle, unilateral    Right  . Whooping cough     Current Outpatient Prescriptions on File Prior to Visit  Medication Sig Dispense Refill  . aspirin 81 MG tablet Take 1 tablet (81 mg total) by mouth daily.    . Calcium Carbonate Antacid (ALKA-SELTZER ANTACID PO) Take by mouth.    . clonazePAM (KLONOPIN) 0.5 MG tablet Take 1 tablet (0.5 mg total) by mouth 2 (two) times daily as needed (agitation and insomnia). 60 tablet 1  . cyanocobalamin (,VITAMIN B-12,) 1000 MCG/ML injection Inject 1000 mcg intramuscular daily for 7 days, then once a week for 4 weeks, then once a month for a year (Patient taking differently: every 30 (thirty) days. Inject 1000 mcg intramuscular daily for 7 days, then once a week for 4 weeks, then once a month for a year) 25 mL 0  . docusate sodium (COLACE) 100 MG capsule Take 200 mg by mouth.     . Eyelid Cleansers (STERILID EX) Apply  topically. Eye Wash: Once Daily    . folic acid (FOLVITE) 1 MG tablet Take 1 mg by mouth daily.    Marland Kitchen glipiZIDE (GLUCOTROL XL) 2.5 MG 24 hr tablet Take 1 tablet (2.5 mg total) by mouth daily with breakfast. 30 tablet 5  . glucose blood test strip Use one strip daily to check blood sugars for diabetes 100 each 12  . levothyroxine (SYNTHROID, LEVOTHROID) 50 MCG tablet Take 1 tablet (50 mcg total) by mouth daily. 90 tablet 3  . lisinopril (PRINIVIL,ZESTRIL) 2.5 MG tablet Take 1 tablet (2.5 mg total) by mouth daily. 90 tablet 3  . loratadine (CLARITIN) 10 MG tablet Take 10 mg by mouth daily.    Marland Kitchen lovastatin (MEVACOR) 10 MG tablet Take 10 mg by mouth at bedtime.     . metFORMIN (GLUCOPHAGE-XR) 500 MG 24 hr tablet Take 2 tablets with breakfast and at bedtime (Patient taking differently: 2,000 mg. Take 2 tablets with breakfast and at bedtime) 360 tablet 1  . methotrexate (RHEUMATREX) 2.5 MG tablet Take 10 mg by mouth once a week. Caution:Chemotherapy. Protect from light.     . metoprolol tartrate (LOPRESSOR) 25 MG tablet Take 0.5 tablets (12.5 mg total) by mouth 2 (two) times daily. 180 tablet 1  . Miconazole Nitrate (TRIPLE PASTE AF) 2 % OINT Apply topically.    Marland Kitchen NEEDLE, DISP, 25 G 25G X 1-1/2" MISC 1 each by Does not apply route as directed.  Use to draw and inject B12 50 each 0  . Omega-3 Fatty Acids (SB OMEGA-3 FISH OIL) 1000 MG CAPS Take 1 capsule by mouth 2 (two) times daily. Reported on 10/06/2015    . OVER THE COUNTER MEDICATION Take 1 each by mouth 2 (two) times daily. Focus Select Supplement: Vitamin C & E, Zinc & Copper    . phenazopyridine (PYRIDIUM) 200 MG tablet Take 1 tablet (200 mg total) by mouth 3 (three) times daily. 6 tablet 0  . Syringe, Disposable, 3 ML MISC 1 each by Does not apply route as directed. 25 each 0  . triamcinolone cream (KENALOG) 0.1 % Apply 1 application topically as needed.      No current facility-administered medications on file prior to visit.     Allergies    Allergen Reactions  . Flomax [Tamsulosin Hcl] Other (See Comments)    Hypotension  . Shellfish Allergy Anaphylaxis  . Aspirin-Dipyridamole Er     Headaches   . Other Other (See Comments)    Blood Thinner given in Rehab gave H/As - Not Coumadin/Headaches      Family History  Problem Relation Age of Onset  . Pneumonia Mother 24    Deceased  . GI Bleed Father 53    Deceased - Ulcers  . Diabetes Cousin   . Diabetes Brother   . Cancer Paternal Aunt     Social History   Social History  . Marital status: Married    Spouse name: N/A  . Number of children: N/A  . Years of education: N/A   Social History Main Topics  . Smoking status: Former Research scientist (life sciences)  . Smokeless tobacco: Never Used     Comment: Hasn't smoked since age 78  . Alcohol use No  . Drug use: No  . Sexual activity: Not Asked   Other Topics Concern  . None   Social History Narrative   Lives with wife in a one story home.  Has 1 child.  Retired Armed forces technical officer.  Education: Oceanographer.      Review of Systems - See HPI.  All other ROS are negative.  BP 112/60   Pulse 68   Temp 97.6 F (36.4 C) (Oral)   Resp 16   Ht 5' 7" (1.702 m)   Wt 159 lb (72.1 kg)   SpO2 98%   BMI 24.90 kg/m   Physical Exam  Constitutional: He is oriented to person, place, and time and well-developed, well-nourished, and in no distress.  HENT:  Head: Normocephalic and atraumatic.  Eyes: Conjunctivae are normal.  Cardiovascular: Normal rate, regular rhythm, normal heart sounds and intact distal pulses.   Pulmonary/Chest: Effort normal and breath sounds normal. No respiratory distress. He has no wheezes. He has no rales. He exhibits no tenderness.  Abdominal: Soft. Bowel sounds are normal. He exhibits no distension. There is no tenderness.  Neurological: He is alert and oriented to person, place, and time.  Skin: Skin is warm and dry. No rash noted.  Psychiatric: Affect normal.  Vitals reviewed.   Recent Results (from the past 2160  hour(s))  TSH     Status: None   Collection Time: 08/23/16  1:22 PM  Result Value Ref Range   TSH 2.92 0.35 - 4.50 uIU/mL  Lipid panel     Status: Abnormal   Collection Time: 09/13/16 12:21 PM  Result Value Ref Range   Cholesterol 91 0 - 200 mg/dL    Comment: ATP III Classification  Desirable:  < 200 mg/dL               Borderline High:  200 - 239 mg/dL          High:  > = 240 mg/dL   Triglycerides 88.0 0.0 - 149.0 mg/dL    Comment: Normal:  <150 mg/dLBorderline High:  150 - 199 mg/dL   HDL 31.30 (L) >39.00 mg/dL   VLDL 17.6 0.0 - 40.0 mg/dL   LDL Cholesterol 42 0 - 99 mg/dL   Total CHOL/HDL Ratio 3     Comment:                Men          Women1/2 Average Risk     3.4          3.3Average Risk          5.0          4.42X Average Risk          9.6          7.13X Average Risk          15.0          11.0                       NonHDL 59.92     Comment: NOTE:  Non-HDL goal should be 30 mg/dL higher than patient's LDL goal (i.e. LDL goal of < 70 mg/dL, would have non-HDL goal of < 100 mg/dL)  Comp Met (CMET)     Status: Abnormal   Collection Time: 09/13/16 12:21 PM  Result Value Ref Range   Sodium 136 135 - 145 mEq/L   Potassium 5.4 (H) 3.5 - 5.1 mEq/L   Chloride 100 96 - 112 mEq/L   CO2 28 19 - 32 mEq/L   Glucose, Bld 133 (H) 70 - 99 mg/dL   BUN 20 6 - 23 mg/dL   Creatinine, Ser 1.13 0.40 - 1.50 mg/dL   Total Bilirubin 0.3 0.2 - 1.2 mg/dL   Alkaline Phosphatase 54 39 - 117 U/L   AST 30 0 - 37 U/L   ALT 39 0 - 53 U/L   Total Protein 6.7 6.0 - 8.3 g/dL   Albumin 4.0 3.5 - 5.2 g/dL   Calcium 9.6 8.4 - 10.5 mg/dL   GFR 64.67 >60.00 mL/min  CBC with Differential     Status: Abnormal   Collection Time: 10/01/16 10:20 PM  Result Value Ref Range   WBC 13.3 (H) 4.0 - 10.5 K/uL   RBC 3.77 (L) 4.22 - 5.81 MIL/uL   Hemoglobin 11.4 (L) 13.0 - 17.0 g/dL   HCT 34.2 (L) 39.0 - 52.0 %   MCV 90.7 78.0 - 100.0 fL   MCH 30.2 26.0 - 34.0 pg   MCHC 33.3 30.0 - 36.0 g/dL   RDW 14.4 11.5 -  15.5 %   Platelets 205 150 - 400 K/uL   Neutrophils Relative % 72 %   Neutro Abs 9.4 (H) 1.7 - 7.7 K/uL   Lymphocytes Relative 15 %   Lymphs Abs 2.0 0.7 - 4.0 K/uL   Monocytes Relative 7 %   Monocytes Absolute 1.0 0.1 - 1.0 K/uL   Eosinophils Relative 6 %   Eosinophils Absolute 0.8 (H) 0.0 - 0.7 K/uL   Basophils Relative 0 %   Basophils Absolute 0.1 0.0 - 0.1 K/uL  Basic metabolic panel     Status: Abnormal   Collection  Time: 10/01/16 10:20 PM  Result Value Ref Range   Sodium 134 (L) 135 - 145 mmol/L   Potassium 4.7 3.5 - 5.1 mmol/L   Chloride 98 (L) 101 - 111 mmol/L   CO2 26 22 - 32 mmol/L   Glucose, Bld 144 (H) 65 - 99 mg/dL   BUN 29 (H) 6 - 20 mg/dL   Creatinine, Ser 1.03 0.61 - 1.24 mg/dL   Calcium 9.5 8.9 - 10.3 mg/dL   GFR calc non Af Amer >60 >60 mL/min   GFR calc Af Amer >60 >60 mL/min    Comment: (NOTE) The eGFR has been calculated using the CKD EPI equation. This calculation has not been validated in all clinical situations. eGFR's persistently <60 mL/min signify possible Chronic Kidney Disease.    Anion gap 10 5 - 15  Urinalysis, Routine w reflex microscopic     Status: Abnormal   Collection Time: 10/01/16 10:45 PM  Result Value Ref Range   Color, Urine RED (A) YELLOW    Comment: BIOCHEMICALS MAY BE AFFECTED BY COLOR   APPearance TURBID (A) CLEAR   Specific Gravity, Urine 1.020 1.005 - 1.030   pH 7.0 5.0 - 8.0   Glucose, UA NEGATIVE NEGATIVE mg/dL   Hgb urine dipstick LARGE (A) NEGATIVE   Bilirubin Urine SMALL (A) NEGATIVE   Ketones, ur 15 (A) NEGATIVE mg/dL   Protein, ur >300 (A) NEGATIVE mg/dL   Nitrite POSITIVE (A) NEGATIVE   Leukocytes, UA LARGE (A) NEGATIVE  Urinalysis, Microscopic (reflex)     Status: Abnormal   Collection Time: 10/01/16 10:45 PM  Result Value Ref Range   RBC / HPF TOO NUMEROUS TO COUNT 0 - 5 RBC/hpf   WBC, UA TOO NUMEROUS TO COUNT 0 - 5 WBC/hpf   Bacteria, UA FEW (A) NONE SEEN   Squamous Epithelial / LPF 0-5 (A) NONE SEEN  Urine  culture     Status: Abnormal   Collection Time: 10/01/16 10:45 PM  Result Value Ref Range   Specimen Description URINE, CATHETERIZED    Special Requests NONE    Culture >=100,000 COLONIES/mL ENTEROCOCCUS FAECALIS (A)    Report Status 10/04/2016 FINAL    Organism ID, Bacteria ENTEROCOCCUS FAECALIS (A)       Susceptibility   Enterococcus faecalis - MIC*    AMPICILLIN <=2 SENSITIVE Sensitive     LEVOFLOXACIN 1 SENSITIVE Sensitive     NITROFURANTOIN <=16 SENSITIVE Sensitive     VANCOMYCIN 1 SENSITIVE Sensitive     * >=100,000 COLONIES/mL ENTEROCOCCUS FAECALIS  POCT Glucose (CBG)     Status: Abnormal   Collection Time: 10/14/16 10:45 AM  Result Value Ref Range   POC Glucose 161 (A) 70 - 99 mg/dl  Hemoglobin A1c     Status: None   Collection Time: 10/14/16 10:51 AM  Result Value Ref Range   Hgb A1c MFr Bld 6.4 4.6 - 6.5 %    Comment: Glycemic Control Guidelines for People with Diabetes:Non Diabetic:  <6%Goal of Therapy: <7%Additional Action Suggested:  >6%   Basic metabolic panel     Status: Abnormal   Collection Time: 10/14/16 10:51 AM  Result Value Ref Range   Sodium 133 (L) 135 - 145 mEq/L   Potassium 5.1 3.5 - 5.1 mEq/L   Chloride 98 96 - 112 mEq/L   CO2 29 19 - 32 mEq/L   Glucose, Bld 175 (H) 70 - 99 mg/dL   BUN 18 6 - 23 mg/dL   Creatinine, Ser 1.02 0.40 - 1.50 mg/dL  Calcium 9.7 8.4 - 10.5 mg/dL   GFR 72.77 >60.00 mL/min  Urine Culture     Status: None   Collection Time: 10/14/16 10:51 AM  Result Value Ref Range   Organism ID, Bacteria NO GROWTH   Urinalysis, Routine w reflex microscopic     Status: Abnormal   Collection Time: 10/17/16  3:33 PM  Result Value Ref Range   Color, Urine YELLOW Yellow;Lt. Yellow   APPearance CLEAR Clear   Specific Gravity, Urine <=1.005 (A) 1.000 - 1.030   pH 7.0 5.0 - 8.0   Total Protein, Urine NEGATIVE Negative   Urine Glucose NEGATIVE Negative   Ketones, ur NEGATIVE Negative   Bilirubin Urine NEGATIVE Negative   Hgb urine dipstick  NEGATIVE Negative   Urobilinogen, UA 0.2 0.0 - 1.0   Leukocytes, UA SMALL (A) Negative   Nitrite NEGATIVE Negative   WBC, UA 3-6/hpf (A) 0-2/hpf   RBC / HPF 0-2/hpf 0-2/hpf   Squamous Epithelial / LPF Rare(0-4/hpf) Rare(0-4/hpf)  Urine Culture     Status: None   Collection Time: 10/17/16  3:33 PM  Result Value Ref Range   Organism ID, Bacteria NO GROWTH   Lipid panel     Status: Abnormal   Collection Time: 10/18/16 11:49 AM  Result Value Ref Range   Cholesterol 139 0 - 200 mg/dL    Comment: ATP III Classification       Desirable:  < 200 mg/dL               Borderline High:  200 - 239 mg/dL          High:  > = 240 mg/dL   Triglycerides 161.0 (H) 0.0 - 149.0 mg/dL    Comment: Normal:  <150 mg/dLBorderline High:  150 - 199 mg/dL   HDL 32.90 (L) >39.00 mg/dL   VLDL 32.2 0.0 - 40.0 mg/dL   LDL Cholesterol 73 0 - 99 mg/dL   Total CHOL/HDL Ratio 4     Comment:                Men          Women1/2 Average Risk     3.4          3.3Average Risk          5.0          4.42X Average Risk          9.6          7.13X Average Risk          15.0          11.0                       NonHDL 105.65     Comment: NOTE:  Non-HDL goal should be 30 mg/dL higher than patient's LDL goal (i.e. LDL goal of < 70 mg/dL, would have non-HDL goal of < 100 mg/dL)  Hepatic function panel     Status: Abnormal   Collection Time: 10/18/16 11:49 AM  Result Value Ref Range   Total Bilirubin 0.5 0.2 - 1.2 mg/dL   Bilirubin, Direct 0.1 0.0 - 0.3 mg/dL   Alkaline Phosphatase 58 39 - 117 U/L   AST 56 (H) 0 - 37 U/L   ALT 77 (H) 0 - 53 U/L   Total Protein 6.9 6.0 - 8.3 g/dL   Albumin 4.0 3.5 - 5.2 g/dL  CBC w/Diff     Status: Abnormal  Collection Time: 10/18/16 11:49 AM  Result Value Ref Range   WBC 8.5 4.0 - 10.5 K/uL   RBC 3.94 (L) 4.22 - 5.81 Mil/uL   Hemoglobin 11.9 (L) 13.0 - 17.0 g/dL   HCT 35.4 (L) 39.0 - 52.0 %   MCV 89.8 78.0 - 100.0 fl   MCHC 33.7 30.0 - 36.0 g/dL   RDW 15.9 (H) 11.5 - 15.5 %   Platelets  211.0 150.0 - 400.0 K/uL   Neutrophils Relative % 59.2 43.0 - 77.0 %   Lymphocytes Relative 17.9 12.0 - 46.0 %   Monocytes Relative 9.1 3.0 - 12.0 %   Eosinophils Relative 13.1 (H) 0.0 - 5.0 %   Basophils Relative 0.7 0.0 - 3.0 %   Neutro Abs 5.1 1.4 - 7.7 K/uL   Lymphs Abs 1.5 0.7 - 4.0 K/uL   Monocytes Absolute 0.8 0.1 - 1.0 K/uL   Eosinophils Absolute 1.1 (H) 0.0 - 0.7 K/uL   Basophils Absolute 0.1 0.0 - 0.1 K/uL  C-reactive protein     Status: None   Collection Time: 10/18/16 11:49 AM  Result Value Ref Range   CRP 0.6 0.5 - 20.0 mg/dL    Assessment/Plan: Hyperlipidemia Repeat labs today. Will alter medication based on result.   Hx: UTI (urinary tract infection) Cloudy urine on cath per wife. 1 episode. No recurrence. Patient a/o x 3. Exam unremarkable. UA with trace LE. Will wait for culture before determining treatment. FU with Urology as scheduled.   Addendum -- Urine culture negative for infection  Falls Referral to [facility] PT placed.  Encounter for therapeutic drug monitoring Lab panel ordered to be sent to Dermatologist Melina Copa) as patient is on Methotrexate for spongiotic dermatitis    Leeanne Rio, PA-C

## 2016-10-18 NOTE — Progress Notes (Signed)
Pre visit review using our clinic review tool, if applicable. No additional management support is needed unless otherwise documented below in the visit note. 

## 2016-10-19 ENCOUNTER — Encounter: Payer: Self-pay | Admitting: Physician Assistant

## 2016-10-20 DIAGNOSIS — Z87440 Personal history of urinary (tract) infections: Secondary | ICD-10-CM | POA: Insufficient documentation

## 2016-10-20 DIAGNOSIS — Z5181 Encounter for therapeutic drug level monitoring: Secondary | ICD-10-CM | POA: Insufficient documentation

## 2016-10-20 NOTE — Assessment & Plan Note (Signed)
Referral to HHPT placed

## 2016-10-20 NOTE — Assessment & Plan Note (Signed)
Repeat labs today. Will alter medication based on result.

## 2016-10-20 NOTE — Assessment & Plan Note (Signed)
Cloudy urine on cath per wife. 1 episode. No recurrence. Patient a/o x 3. Exam unremarkable. UA with trace LE. Will wait for culture before determining treatment. FU with Urology as scheduled.   Addendum -- Urine culture negative for infection

## 2016-10-20 NOTE — Assessment & Plan Note (Signed)
Lab panel ordered to be sent to Dermatologist Melina Copa) as patient is on Methotrexate for spongiotic dermatitis

## 2016-10-22 ENCOUNTER — Encounter: Payer: Self-pay | Admitting: Physician Assistant

## 2016-10-22 ENCOUNTER — Telehealth: Payer: Self-pay | Admitting: *Deleted

## 2016-10-22 NOTE — Telephone Encounter (Signed)
Home care called just with an FYI that [facility] will not be starting Home Care today, but they are scheduled to start on 10/25/16.

## 2016-10-23 DIAGNOSIS — Z79899 Other long term (current) drug therapy: Secondary | ICD-10-CM | POA: Diagnosis not present

## 2016-10-23 DIAGNOSIS — H353111 Nonexudative age-related macular degeneration, right eye, early dry stage: Secondary | ICD-10-CM | POA: Diagnosis not present

## 2016-10-23 DIAGNOSIS — H353122 Nonexudative age-related macular degeneration, left eye, intermediate dry stage: Secondary | ICD-10-CM | POA: Diagnosis not present

## 2016-10-23 DIAGNOSIS — H04123 Dry eye syndrome of bilateral lacrimal glands: Secondary | ICD-10-CM | POA: Diagnosis not present

## 2016-10-23 DIAGNOSIS — H524 Presbyopia: Secondary | ICD-10-CM | POA: Diagnosis not present

## 2016-10-23 DIAGNOSIS — E119 Type 2 diabetes mellitus without complications: Secondary | ICD-10-CM | POA: Diagnosis not present

## 2016-10-24 ENCOUNTER — Emergency Department (HOSPITAL_BASED_OUTPATIENT_CLINIC_OR_DEPARTMENT_OTHER): Payer: Medicare Other

## 2016-10-24 ENCOUNTER — Telehealth: Payer: Self-pay | Admitting: Physician Assistant

## 2016-10-24 ENCOUNTER — Encounter (HOSPITAL_BASED_OUTPATIENT_CLINIC_OR_DEPARTMENT_OTHER): Payer: Self-pay | Admitting: Emergency Medicine

## 2016-10-24 ENCOUNTER — Inpatient Hospital Stay (HOSPITAL_BASED_OUTPATIENT_CLINIC_OR_DEPARTMENT_OTHER)
Admission: EM | Admit: 2016-10-24 | Discharge: 2016-10-27 | DRG: 872 | Disposition: A | Discharge status: Home Health | Payer: Medicare Other | Attending: Family Medicine | Admitting: Family Medicine

## 2016-10-24 DIAGNOSIS — A419 Sepsis, unspecified organism: Secondary | ICD-10-CM | POA: Diagnosis not present

## 2016-10-24 DIAGNOSIS — Z66 Do not resuscitate: Secondary | ICD-10-CM | POA: Diagnosis present

## 2016-10-24 DIAGNOSIS — I480 Paroxysmal atrial fibrillation: Secondary | ICD-10-CM | POA: Diagnosis present

## 2016-10-24 DIAGNOSIS — Z7984 Long term (current) use of oral hypoglycemic drugs: Secondary | ICD-10-CM | POA: Diagnosis not present

## 2016-10-24 DIAGNOSIS — Z87891 Personal history of nicotine dependence: Secondary | ICD-10-CM

## 2016-10-24 DIAGNOSIS — E11319 Type 2 diabetes mellitus with unspecified diabetic retinopathy without macular edema: Secondary | ICD-10-CM | POA: Diagnosis present

## 2016-10-24 DIAGNOSIS — Z91013 Allergy to seafood: Secondary | ICD-10-CM

## 2016-10-24 DIAGNOSIS — K219 Gastro-esophageal reflux disease without esophagitis: Secondary | ICD-10-CM | POA: Diagnosis present

## 2016-10-24 DIAGNOSIS — Z87440 Personal history of urinary (tract) infections: Secondary | ICD-10-CM | POA: Diagnosis not present

## 2016-10-24 DIAGNOSIS — N189 Chronic kidney disease, unspecified: Secondary | ICD-10-CM

## 2016-10-24 DIAGNOSIS — N390 Urinary tract infection, site not specified: Secondary | ICD-10-CM | POA: Diagnosis present

## 2016-10-24 DIAGNOSIS — Z833 Family history of diabetes mellitus: Secondary | ICD-10-CM | POA: Diagnosis not present

## 2016-10-24 DIAGNOSIS — Z888 Allergy status to other drugs, medicaments and biological substances status: Secondary | ICD-10-CM | POA: Diagnosis not present

## 2016-10-24 DIAGNOSIS — J069 Acute upper respiratory infection, unspecified: Secondary | ICD-10-CM | POA: Diagnosis present

## 2016-10-24 DIAGNOSIS — R404 Transient alteration of awareness: Secondary | ICD-10-CM | POA: Diagnosis not present

## 2016-10-24 DIAGNOSIS — IMO0002 Reserved for concepts with insufficient information to code with codable children: Secondary | ICD-10-CM | POA: Diagnosis present

## 2016-10-24 DIAGNOSIS — R509 Fever, unspecified: Secondary | ICD-10-CM | POA: Diagnosis not present

## 2016-10-24 DIAGNOSIS — M069 Rheumatoid arthritis, unspecified: Secondary | ICD-10-CM | POA: Diagnosis present

## 2016-10-24 DIAGNOSIS — N289 Disorder of kidney and ureter, unspecified: Secondary | ICD-10-CM | POA: Diagnosis present

## 2016-10-24 DIAGNOSIS — Z8619 Personal history of other infectious and parasitic diseases: Secondary | ICD-10-CM

## 2016-10-24 DIAGNOSIS — Z886 Allergy status to analgesic agent status: Secondary | ICD-10-CM | POA: Diagnosis not present

## 2016-10-24 DIAGNOSIS — E1121 Type 2 diabetes mellitus with diabetic nephropathy: Secondary | ICD-10-CM | POA: Diagnosis not present

## 2016-10-24 DIAGNOSIS — Z7982 Long term (current) use of aspirin: Secondary | ICD-10-CM | POA: Diagnosis not present

## 2016-10-24 DIAGNOSIS — Z8673 Personal history of transient ischemic attack (TIA), and cerebral infarction without residual deficits: Secondary | ICD-10-CM

## 2016-10-24 DIAGNOSIS — E1165 Type 2 diabetes mellitus with hyperglycemia: Secondary | ICD-10-CM | POA: Diagnosis present

## 2016-10-24 DIAGNOSIS — E538 Deficiency of other specified B group vitamins: Secondary | ICD-10-CM | POA: Diagnosis present

## 2016-10-24 DIAGNOSIS — Z79899 Other long term (current) drug therapy: Secondary | ICD-10-CM | POA: Diagnosis not present

## 2016-10-24 DIAGNOSIS — E785 Hyperlipidemia, unspecified: Secondary | ICD-10-CM | POA: Diagnosis present

## 2016-10-24 DIAGNOSIS — R6521 Severe sepsis with septic shock: Secondary | ICD-10-CM

## 2016-10-24 DIAGNOSIS — R531 Weakness: Secondary | ICD-10-CM | POA: Diagnosis not present

## 2016-10-24 DIAGNOSIS — E039 Hypothyroidism, unspecified: Secondary | ICD-10-CM | POA: Diagnosis present

## 2016-10-24 HISTORY — DX: Zoster without complications: B02.9

## 2016-10-24 LAB — URINALYSIS, ROUTINE W REFLEX MICROSCOPIC
Bilirubin Urine: NEGATIVE
Glucose, UA: NEGATIVE mg/dL
Ketones, ur: NEGATIVE mg/dL
Nitrite: NEGATIVE
Protein, ur: 30 mg/dL — AB
SPECIFIC GRAVITY, URINE: 1.013 (ref 1.005–1.030)
pH: 6.5 (ref 5.0–8.0)

## 2016-10-24 LAB — BASIC METABOLIC PANEL
Anion gap: 11 (ref 5–15)
BUN: 25 mg/dL — AB (ref 6–20)
CHLORIDE: 98 mmol/L — AB (ref 101–111)
CO2: 23 mmol/L (ref 22–32)
Calcium: 9.4 mg/dL (ref 8.9–10.3)
Creatinine, Ser: 1.19 mg/dL (ref 0.61–1.24)
GFR calc Af Amer: >60 mL/min (ref >60)
GFR calc non Af Amer: 52 mL/min — ABNORMAL LOW (ref >60)
Glucose, Bld: 202 mg/dL — ABNORMAL HIGH (ref 65–99)
POTASSIUM: 5.1 mmol/L (ref 3.5–5.1)
SODIUM: 132 mmol/L — AB (ref 135–145)

## 2016-10-24 LAB — CBC WITH DIFFERENTIAL/PLATELET
Basophils Absolute: 0.1 10*3/uL (ref 0.0–0.1)
Basophils Relative: 1 %
EOS ABS: 0.5 10*3/uL (ref 0.0–0.7)
Eosinophils Relative: 4 %
HCT: 35.7 % — ABNORMAL LOW (ref 39.0–52.0)
HEMOGLOBIN: 12.2 g/dL — AB (ref 13.0–17.0)
LYMPHS ABS: 1.3 10*3/uL (ref 0.7–4.0)
LYMPHS PCT: 11 %
MCH: 30.7 pg (ref 26.0–34.0)
MCHC: 34.2 g/dL (ref 30.0–36.0)
MCV: 89.9 fL (ref 78.0–100.0)
Monocytes Absolute: 1.4 10*3/uL — ABNORMAL HIGH (ref 0.1–1.0)
Monocytes Relative: 12 %
NEUTROS PCT: 72 %
Neutro Abs: 8.8 10*3/uL — ABNORMAL HIGH (ref 1.7–7.7)
Platelets: 202 10*3/uL (ref 150–400)
RBC: 3.97 MIL/uL — AB (ref 4.22–5.81)
RDW: 15.1 % (ref 11.5–15.5)
WBC: 12.1 10*3/uL — AB (ref 4.0–10.5)

## 2016-10-24 LAB — HEPATIC FUNCTION PANEL
ALBUMIN: 3.7 g/dL (ref 3.5–5.0)
ALK PHOS: 59 U/L (ref 38–126)
ALT: 78 U/L — AB (ref 17–63)
AST: 56 U/L — AB (ref 15–41)
BILIRUBIN INDIRECT: 0.4 mg/dL (ref 0.3–0.9)
Bilirubin, Direct: 0.2 mg/dL (ref 0.1–0.5)
TOTAL PROTEIN: 7.2 g/dL (ref 6.5–8.1)
Total Bilirubin: 0.6 mg/dL (ref 0.3–1.2)

## 2016-10-24 LAB — I-STAT CG4 LACTIC ACID, ED
LACTIC ACID, VENOUS: 2.39 mmol/L — AB (ref 0.5–1.9)
Lactic Acid, Venous: 4.38 mmol/L (ref 0.5–1.9)

## 2016-10-24 LAB — URINALYSIS, MICROSCOPIC (REFLEX)

## 2016-10-24 MED ORDER — SODIUM CHLORIDE 0.9 % IV BOLUS (SEPSIS)
1000.0000 mL | Freq: Once | INTRAVENOUS | Status: AC
  Administered 2016-10-24: 1000 mL via INTRAVENOUS

## 2016-10-24 MED ORDER — VANCOMYCIN HCL IN DEXTROSE 1-5 GM/200ML-% IV SOLN
1000.0000 mg | Freq: Once | INTRAVENOUS | Status: AC
  Administered 2016-10-24: 1000 mg via INTRAVENOUS
  Filled 2016-10-24: qty 200

## 2016-10-24 MED ORDER — VANCOMYCIN HCL IN DEXTROSE 1-5 GM/200ML-% IV SOLN
1000.0000 mg | INTRAVENOUS | Status: DC
  Administered 2016-10-25: 1000 mg via INTRAVENOUS
  Filled 2016-10-24 (×2): qty 200

## 2016-10-24 MED ORDER — PIPERACILLIN-TAZOBACTAM 3.375 G IVPB
3.3750 g | Freq: Three times a day (TID) | INTRAVENOUS | Status: DC
  Administered 2016-10-25 – 2016-10-26 (×5): 3.375 g via INTRAVENOUS
  Filled 2016-10-24 (×8): qty 50

## 2016-10-24 MED ORDER — PIPERACILLIN-TAZOBACTAM 3.375 G IVPB 30 MIN
3.3750 g | Freq: Once | INTRAVENOUS | Status: AC
  Administered 2016-10-24: 3.375 g via INTRAVENOUS
  Filled 2016-10-24 (×2): qty 50

## 2016-10-24 MED ORDER — ACETAMINOPHEN 325 MG PO TABS
650.0000 mg | ORAL_TABLET | Freq: Once | ORAL | Status: AC
  Administered 2016-10-24: 650 mg via ORAL
  Filled 2016-10-24: qty 2

## 2016-10-24 MED ORDER — SODIUM CHLORIDE 0.9 % IV BOLUS (SEPSIS)
250.0000 mL | Freq: Once | INTRAVENOUS | Status: AC
  Administered 2016-10-24: 250 mL via INTRAVENOUS

## 2016-10-24 NOTE — ED Notes (Signed)
Multiple attempts to catheterize pt I&O without success using different size catheters.  Coude used and placed on drainage bag at this time.

## 2016-10-24 NOTE — Telephone Encounter (Signed)
Patient Name: Gerald Holt  DOB: 28-Jul-1924    Initial Comment Caller states husband has been having shakes and he has felt really weak. His breathing has also been labored and has a fever. Patient will PA Leeanne Rio.    Nurse Assessment  Nurse: Christel Mormon, RN, Levada Dy Date/Time (Eastern Time): 10/24/2016 3:53:03 PM  Confirm and document reason for call. If symptomatic, describe symptoms. ---Caller states husband has been having shakes and he has felt really weak. His breathing has also been labored and has a temp of 102.4.  Does the patient have any new or worsening symptoms? ---Yes  Will a triage be completed? ---Yes  Related visit to physician within the last 2 weeks? ---Yes  Does the PT have any chronic conditions? (i.e. diabetes, asthma, etc.) ---Yes  List chronic conditions. ---diabetes, hypothyroidism, hx of a.fib, GERD  Is this a behavioral health or substance abuse call? ---No     Guidelines    Guideline Title Affirmed Question Affirmed Notes  Weakness (Generalized) and Fatigue [1] SEVERE weakness (i.e., unable to walk or barely able to walk, requires support) AND [2] new onset or worsening    Final Disposition User   Call EMS 911 Now [place], RN, Levada Dy    Disagree/Comply: Leta Baptist

## 2016-10-24 NOTE — ED Triage Notes (Signed)
Fever and chills reported that started 4 hours ago at home.  No meds given at home. Brought to ED via ems. Family suspects UTI d/t frequent UTIs and constant in and out cats at home.

## 2016-10-24 NOTE — ED Provider Notes (Signed)
[place] DEPT [facility] Provider Note   CSN: [id number] Arrival date & time: 10/24/16  [place]     History   Chief Complaint Chief Complaint  Patient presents with  . Fever    HPI Gerald Holt is a 83 y.o. male.  Patient presents with fever and chills. He is brought in by his wife he reports that patient started having shaking chills earlier today about 1:00 this afternoon. He subsequently developed a fever. He is had some mild rhinorrhea which is unchanged from his baseline. He reports a mild cough. He denies abdominal pain. No rash other than he does have healing shingles. The wife is noticing increased confusion today. He's had difficulty focusing today. He's had 2 prior urinary tract infections starting in July 2017. At that point he was told that his bladder is not working correctly and since that time he's been doing in and out of cast 3 times a day. He was on prophylactic antibiotics until early last month. His last UTI was July 7. He is followed by urology in [place] with [facility].      Past Medical History:  Diagnosis Date  . A-fib ([place])   . Cataracts, bilateral   . Chronic dermatitis   . Diabetes type 2, controlled ([place]) 2000  . History of chicken pox   . Mumps    Adult  . Retinopathy   . Stroke [facility]) Nov 2011  . Undescended testicle, unilateral    Right  . Whooping cough     Patient Active Problem List   Diagnosis Date Noted  . Sepsis ([place]) 10/24/2016  . Encounter for therapeutic drug monitoring 10/20/2016  . Hx: UTI (urinary tract infection) 10/20/2016  . Hypothyroidism 09/15/2016  . Gastroesophageal reflux disease without esophagitis 07/09/2015  . Falls 06/22/2015  . Diabetes mellitus type II, uncontrolled ([place]) 05/29/2015  . Paroxysmal atrial fibrillation ([place]) 05/29/2015  . Hyperlipidemia 05/29/2015  . Hx of completed stroke 05/29/2015    Past Surgical History:  Procedure Laterality Date  . PRE-MALIGNANT / BENIGN SKIN LESION EXCISION    .  TESTICLE SURGERY     Right, undescended  . TOOTH EXTRACTION         Home Medications    Prior to Admission medications   Medication Sig Start Date End Date Taking? Authorizing Provider  aspirin 81 MG tablet Take 1 tablet (81 mg total) by mouth daily. 06/22/15   Jerline Pain, MD  Calcium Carbonate Antacid (ALKA-SELTZER ANTACID PO) Take by mouth.    Historical Provider, MD  clonazePAM (KLONOPIN) 0.5 MG tablet Take 1 tablet (0.5 mg total) by mouth 2 (two) times daily as needed (agitation and insomnia). 04/01/16   [name], PA-C  cyanocobalamin (,VITAMIN B-12,) 1000 MCG/ML injection Inject 1000 mcg intramuscular daily for 7 days, then once a week for 4 weeks, then once a month for a year Patient taking differently: every 30 (thirty) days. Inject 1000 mcg intramuscular daily for 7 days, then once a week for 4 weeks, then once a month for a year 04/02/16   Pieter Partridge, DO  docusate sodium (COLACE) 100 MG capsule Take 200 mg by mouth.     Historical Provider, MD  Eyelid Cleansers (STERILID EX) Apply topically. Eye Wash: Once Daily    Historical Provider, MD  folic acid (FOLVITE) 1 MG tablet Take 1 mg by mouth daily.    Historical Provider, MD  glipiZIDE (GLUCOTROL XL) 2.5 MG 24 hr tablet Take 1 tablet (2.5 mg total) by mouth daily  with breakfast. 10/17/16   [name], PA-C  glucose blood test strip Use one strip daily to check blood sugars for diabetes 10/17/16   [name], PA-C  levothyroxine (SYNTHROID, LEVOTHROID) 50 MCG tablet Take 1 tablet (50 mcg total) by mouth daily. 04/09/16   [name], PA-C  lisinopril (PRINIVIL,ZESTRIL) 2.5 MG tablet Take 1 tablet (2.5 mg total) by mouth daily. 04/09/16   [name], PA-C  loratadine (CLARITIN) 10 MG tablet Take 10 mg by mouth daily.    Historical Provider, MD  lovastatin (MEVACOR) 10 MG tablet Take 10 mg by mouth at bedtime.  08/21/16   Historical Provider, MD  metFORMIN (GLUCOPHAGE-XR) 500 MG 24 hr tablet Take 2 tablets  with breakfast and at bedtime Patient taking differently: 2,000 mg. Take 2 tablets with breakfast and at bedtime 08/21/16   [name], PA-C  methotrexate (RHEUMATREX) 2.5 MG tablet Take 10 mg by mouth once a week. Caution:Chemotherapy. Protect from light.     Historical Provider, MD  metoprolol tartrate (LOPRESSOR) 25 MG tablet Take 0.5 tablets (12.5 mg total) by mouth 2 (two) times daily. 04/09/16   [name], PA-C  Miconazole Nitrate (TRIPLE PASTE AF) 2 % OINT Apply topically.    Historical Provider, MD  NEEDLE, DISP, 25 G 25G X 1-1/2" MISC 1 each by Does not apply route as directed. Use to draw and inject B12 04/02/16   Pieter Partridge, DO  Omega-3 Fatty Acids (SB OMEGA-3 FISH OIL) 1000 MG CAPS Take 1 capsule by mouth 2 (two) times daily. Reported on 10/06/2015    Historical Provider, MD  OVER THE COUNTER MEDICATION Take 1 each by mouth 2 (two) times daily. Focus Select Supplement: Vitamin C & E, Zinc & Copper    Historical Provider, MD  phenazopyridine (PYRIDIUM) 200 MG tablet Take 1 tablet (200 mg total) by mouth 3 (three) times daily. 10/01/16   Deno Etienne, DO  Syringe, Disposable, 3 ML MISC 1 each by Does not apply route as directed. 04/02/16   Pieter Partridge, DO  triamcinolone cream (KENALOG) 0.1 % Apply 1 application topically as needed.  02/01/16   Historical Provider, MD  valACYclovir (VALTREX) 500 MG tablet Take 500 mg by mouth 2 (two) times daily. Take for 10 days for shingles    Historical Provider, MD    Family History Family History  Problem Relation Age of Onset  . Pneumonia Mother 51    Deceased  . GI Bleed Father 51    Deceased - Ulcers  . Diabetes Cousin   . Diabetes Brother   . Cancer Paternal Aunt     Social History Social History  Substance Use Topics  . Smoking status: Former Research scientist (life sciences)  . Smokeless tobacco: Never Used     Comment: Hasn't smoked since age 37  . Alcohol use No     Allergies   Flomax [tamsulosin hcl]; Shellfish allergy; Aspirin-dipyridamole er;  and Other   Review of Systems Review of Systems  Constitutional: Positive for chills, fatigue and fever. Negative for diaphoresis.  HENT: Negative for congestion, rhinorrhea and sneezing.   Eyes: Negative.   Respiratory: Negative for cough, chest tightness and shortness of breath.   Cardiovascular: Negative for chest pain and leg swelling.  Gastrointestinal: Negative for abdominal pain, blood in stool, diarrhea, nausea and vomiting.  Genitourinary: Negative for difficulty urinating, flank pain, frequency and hematuria.  Musculoskeletal: Negative for arthralgias and back pain.  Skin: Negative for rash.  Neurological: Negative for  dizziness, speech difficulty, weakness, numbness and headaches.  Psychiatric/Behavioral: Positive for confusion.     Physical Exam Updated Vital Signs BP (!) 102/54   Pulse 69   Temp 99.8 F (37.7 C) (Rectal)   Resp 17   Ht 5\' 7"  (1.702 m)   Wt 159 lb (72.1 kg)   SpO2 97%   BMI 24.90 kg/m   Physical Exam  Constitutional: He is oriented to person, place, and time. He appears well-developed and well-nourished.  HENT:  Head: Normocephalic and atraumatic.  Eyes: Pupils are equal, round, and reactive to light.  Neck: Normal range of motion. Neck supple.  Cardiovascular: Normal rate and regular rhythm.   Murmur heard. Pulmonary/Chest: Effort normal and breath sounds normal. No respiratory distress. He has no wheezes. He has no rales. He exhibits no tenderness.  Abdominal: Soft. Bowel sounds are normal. There is no tenderness. There is no rebound and no guarding.  Musculoskeletal: Normal range of motion. He exhibits no edema.  Lymphadenopathy:    He has no cervical adenopathy.  Neurological: He is alert and oriented to person, place, and time.  Skin: Skin is warm and dry. No rash noted.  Psychiatric: He has a normal mood and affect.     ED Treatments / Results  Labs (all labs ordered are listed, but only abnormal results are displayed) Labs  Reviewed  CBC WITH DIFFERENTIAL/PLATELET - Abnormal; Notable for the following:       Result Value   WBC 12.1 (*)    RBC 3.97 (*)    Hemoglobin 12.2 (*)    HCT 35.7 (*)    Neutro Abs 8.8 (*)    Monocytes Absolute 1.4 (*)    All other components within normal limits  BASIC METABOLIC PANEL - Abnormal; Notable for the following:    Sodium 132 (*)    Chloride 98 (*)    Glucose, Bld 202 (*)    BUN 25 (*)    GFR calc non Af Amer 52 (*)    All other components within normal limits  URINALYSIS, ROUTINE W REFLEX MICROSCOPIC - Abnormal; Notable for the following:    APPearance CLOUDY (*)    Hgb urine dipstick SMALL (*)    Protein, ur 30 (*)    Leukocytes, UA LARGE (*)    All other components within normal limits  HEPATIC FUNCTION PANEL - Abnormal; Notable for the following:    AST 56 (*)    ALT 78 (*)    All other components within normal limits  URINALYSIS, MICROSCOPIC (REFLEX) - Abnormal; Notable for the following:    Bacteria, UA MANY (*)    Squamous Epithelial / LPF 0-5 (*)    All other components within normal limits  I-STAT CG4 LACTIC ACID, ED - Abnormal; Notable for the following:    Lactic Acid, Venous 4.38 (*)    All other components within normal limits  I-STAT CG4 LACTIC ACID, ED - Abnormal; Notable for the following:    Lactic Acid, Venous 2.39 (*)    All other components within normal limits  CULTURE, BLOOD (ROUTINE X 2)  CULTURE, BLOOD (ROUTINE X 2)  URINE CULTURE  I-STAT CG4 LACTIC ACID, ED    EKG  EKG Interpretation  Date/Time:  Thursday October 24 2016 18:15:02 EST Ventricular Rate:  82 PR Interval:    QRS Duration: 139 QT Interval:  380 QTC Calculation: 444 R Axis:   75 Text Interpretation:  Sinus rhythm Atrial premature complex Right bundle branch block since last tracing  no significant change Confirmed by Diem Pagnotta  MD, Daziyah Cogan ([id number]) on 10/24/2016 7:51:10 PM       Radiology Dg Chest Port 1 View  Result Date: 10/24/2016 CLINICAL DATA:  Sepsis EXAM:  PORTABLE CHEST 1 VIEW COMPARISON:  05/30/2016 FINDINGS: Diffuse interstitial prominence, new. This could represent an infectious pneumonitis, but noninfectious inflammatory or fibrotic processes are also possible. Interstitial edema appears less likely, given the lack of accompanying vascular changes. No airspace consolidation. No effusions. Hilar, mediastinal and cardiac contours are unremarkable. IMPRESSION: Diffuse interstitial prominence, possibly infectious or inflammatory. Electronically Signed   By: Andreas Newport M.D.   On: 10/24/2016 18:40    Procedures Procedures (including critical care time)  Medications Ordered in ED Medications  vancomycin (VANCOCIN) IVPB 1000 mg/200 mL premix (not administered)  piperacillin-tazobactam (ZOSYN) IVPB 3.375 g (not administered)  sodium chloride 0.9 % bolus 1,000 mL (1,000 mLs Intravenous Transfusing/Transfer 10/24/16 2042)  acetaminophen (TYLENOL) tablet 650 mg (650 mg Oral Given 10/24/16 1733)  sodium chloride 0.9 % bolus 1,000 mL (0 mLs Intravenous Stopped 10/24/16 1808)    And  sodium chloride 0.9 % bolus 1,000 mL (0 mLs Intravenous Stopped 10/24/16 1900)    And  sodium chloride 0.9 % bolus 250 mL (0 mLs Intravenous Stopped 10/24/16 1900)  piperacillin-tazobactam (ZOSYN) IVPB 3.375 g (0 g Intravenous Stopped 10/24/16 1838)  vancomycin (VANCOCIN) IVPB 1000 mg/200 mL premix (0 mg Intravenous Stopped 10/24/16 1925)     Initial Impression / Assessment and Plan / ED Course  I have reviewed the triage vital signs and the nursing notes.  Pertinent labs & imaging results that were available during my care of the patient were reviewed by me and considered in my medical decision making (see chart for details).     Patient presents with fever and chills. He does have clinical markers consistent with sepsis. He has no hypotension. He was started on broad-spectrum antibiotics and 30 cc/kg fluid bolus. Code sepsis was initiated. He does have evidence of a urinary  tract infection and there is a questionable pneumonia although he only reports a mild cough. I will consult the hospitalist at [facility] for admission.  I spoke with Dr. Loleta Books who has accepted pt for admission.  BP trending down, but not requiring pressors.  Pt alert and answering questions appropriately.  I did discuss with pt and wife what pt desires as far as aggressiveness of treatment and pt is ok with pressors and central line if needed in the future.  Pt does have DNR papers.  Sepsis - Repeat Assessment  Performed at:    [address]  Vitals     Blood pressure (!) 102/54, pulse 69, temperature 99.8 F (37.7 C), temperature source Rectal, resp. rate 17, height 5\' 7"  (1.702 m), weight 159 lb (72.1 kg), SpO2 97 %.  Heart:     Regular rate and rhythm  Lungs:    CTA  Capillary Refill:   <2 sec  Peripheral Pulse:   Radial pulse palpable  Skin:     Normal Color   22:26 [facility] here to transfer pt.  BP 100/53, HR 66, sat  97% on RA.  Final Clinical Impressions(s) / ED Diagnoses   Final diagnoses:  Sepsis, due to unspecified organism [facility])  Urinary tract infection without hematuria, site unspecified    New Prescriptions New Prescriptions   No medications on file     Malvin Johns, MD 10/24/16 2226

## 2016-10-24 NOTE — ED Notes (Signed)
Attempted in and out cath but was not able to advance past the point of resistance.  No urine obtained.

## 2016-10-24 NOTE — ED Notes (Signed)
Pt placed on 2lt [place] due to SATS of 90%. Sats rose to 94%

## 2016-10-24 NOTE — Telephone Encounter (Signed)
Patient Name: Gerald Holt  DOB: [birth year]    Initial Comment Caller's husband had UTI a few weeks ago, finished antibiotics, and is finishing antibiotics for shingles. He is shaking and says he's really cold.    Nurse Assessment  Nurse: Raphael Gibney, RN, Vanita Ingles Date/Time (Eastern Time): 10/24/2016 2:06:15 PM  Confirm and document reason for call. If symptomatic, describe symptoms. ---Caller states spouse had UTI a few weeks ago and he finished antibiotics. he has shingles and is taking antibiotics. He is shaking and he says he is cold. Temp 98.7 by forehead. Blood sugar 98 after lunch about an hr ago. No vomiting or diarrhea. He is alert. She caths him and his urine had just a little bit of sediment this am. Blood sugar is 166 now. No pain. pt has stopped shaking since I have been on the phone. Temp 98.9 now.  Does the patient have any new or worsening symptoms? ---Yes  Will a triage be completed? ---Yes  Related visit to physician within the last 2 weeks? ---No  Does the PT have any chronic conditions? (i.e. diabetes, asthma, etc.) ---Yes  List chronic conditions. ---diabetes; a fib  Is this a behavioral health or substance abuse call? ---No     Guidelines    Guideline Title Affirmed Question Affirmed Notes  Fever [1] Fever AND [2] no signs of serious infection or localizing symptoms (all other triage questions negative)    Final Disposition User   [facility] [place], RN, Vanita Ingles

## 2016-10-24 NOTE — ED Notes (Signed)
Per EMS upon arrival, pt was not AMS.  Per wife and caregiver, at bedside now, pt is slightly altered today from his normal.

## 2016-10-24 NOTE — Telephone Encounter (Signed)
Please call patient in the morning to follow-up with Gerald Holt and his wife.

## 2016-10-24 NOTE — ED Notes (Signed)
Blood cultures were drawn at the time of IV insertions, at 1700 from left [facility] and 1710 from left forearm.

## 2016-10-24 NOTE — Progress Notes (Signed)
Pharmacy Antibiotic Note  Gerald Holt is a 83 y.o. male admitted on 10/24/2016 with sepsis.  Pharmacy has been consulted for vancomycin and zosyn dosing. Renal function wnl.  Vancomycin trough goal 15-20  Plan: 1) Vancomycin 1g IV q24 2) Zosyn 3.375g IV q8 (EI) 3) Follow renal function, cultures, LOT, level if needed  Height: 5\' 7"  (170.2 cm) Weight: 159 lb (72.1 kg) IBW/kg (Calculated) : 66.1  Temp (24hrs), Avg:101.9 F (38.8 C), Min:100.8 F (38.2 C), Max:102.9 F (39.4 C)   Recent Labs Lab 10/18/16 1149 10/24/16 1708 10/24/16 1723  WBC 8.5 12.1*  --   CREATININE  --  1.19  --   LATICACIDVEN  --   --  4.38*    Estimated Creatinine Clearance: 38.6 mL/min (by C-G formula based on SCr of 1.19 mg/dL).    Allergies  Allergen Reactions  . Flomax [Tamsulosin Hcl] Other (See Comments)    Hypotension  . Shellfish Allergy Anaphylaxis  . Aspirin-Dipyridamole Er     Headaches   . Other Other (See Comments)    Blood Thinner given in Rehab gave H/As - Not Coumadin/Headaches      Antimicrobials this admission: 2/1 Vancomycin >> 2/1 Zosyn >>  Dose adjustments this admission: N/a  Microbiology results: 21/ blood x2>> 2/1 urine >>  Thank you for allowing pharmacy to be a part of this patient's care.  Deboraha Sprang 10/24/2016 6:01 PM

## 2016-10-24 NOTE — ED Notes (Signed)
ED Provider at bedside, Dr. Tamera Punt

## 2016-10-24 NOTE — Progress Notes (Signed)
83 yo M with fever chills, found to be septic UTI.    Self-caths at home.  Hx of UTI.  BP (!) 100/54   Pulse 76   Temp 101.3 F (38.5 C) (Rectal)   Resp 16   Ht 5\' 7"  (1.702 m)   Wt 72.1 kg (159 lb)   SpO2 93%   BMI 24.90 kg/m   Na  132, K 5.1, Cr 1.19 (baseline 1.02), WBC 12.1K, Hgb12.2, LFT slight bump Lactate >4 UA with pyuria CXR interstitial findings, suspect not pneumonia  Got vanc/Zosyn, 30 cc/kg, cultures obtained.  Oriented to person, place, date.  A little confused.  BP trending down somewhat but EDP feels is comfortable.  Is DNR.  EDP will discuss if patient would want line placed for pressors if needed.  Will give additional bolus now and then transfer to SDU.  If BP drops further or patient otherwise decompensates before transfer, will call CCM.

## 2016-10-25 ENCOUNTER — Telehealth: Payer: Self-pay | Admitting: Physician Assistant

## 2016-10-25 DIAGNOSIS — I480 Paroxysmal atrial fibrillation: Secondary | ICD-10-CM

## 2016-10-25 DIAGNOSIS — N289 Disorder of kidney and ureter, unspecified: Secondary | ICD-10-CM

## 2016-10-25 DIAGNOSIS — E039 Hypothyroidism, unspecified: Secondary | ICD-10-CM

## 2016-10-25 DIAGNOSIS — N390 Urinary tract infection, site not specified: Secondary | ICD-10-CM

## 2016-10-25 DIAGNOSIS — E1121 Type 2 diabetes mellitus with diabetic nephropathy: Secondary | ICD-10-CM

## 2016-10-25 DIAGNOSIS — N189 Chronic kidney disease, unspecified: Secondary | ICD-10-CM

## 2016-10-25 DIAGNOSIS — A419 Sepsis, unspecified organism: Secondary | ICD-10-CM | POA: Diagnosis present

## 2016-10-25 LAB — RESPIRATORY PANEL BY PCR
ADENOVIRUS-RVPPCR: NOT DETECTED
BORDETELLA PERTUSSIS-RVPCR: NOT DETECTED
CHLAMYDOPHILA PNEUMONIAE-RVPPCR: NOT DETECTED
CORONAVIRUS HKU1-RVPPCR: NOT DETECTED
CORONAVIRUS NL63-RVPPCR: NOT DETECTED
Coronavirus 229E: NOT DETECTED
Coronavirus OC43: NOT DETECTED
INFLUENZA A-RVPPCR: NOT DETECTED
Influenza B: NOT DETECTED
MYCOPLASMA PNEUMONIAE-RVPPCR: NOT DETECTED
Metapneumovirus: NOT DETECTED
PARAINFLUENZA VIRUS 3-RVPPCR: NOT DETECTED
PARAINFLUENZA VIRUS 4-RVPPCR: NOT DETECTED
Parainfluenza Virus 1: NOT DETECTED
Parainfluenza Virus 2: NOT DETECTED
Respiratory Syncytial Virus: NOT DETECTED
Rhinovirus / Enterovirus: NOT DETECTED

## 2016-10-25 LAB — MRSA PCR SCREENING: MRSA BY PCR: NEGATIVE

## 2016-10-25 LAB — CBC
HCT: 31.1 % — ABNORMAL LOW (ref 39.0–52.0)
HEMOGLOBIN: 10.5 g/dL — AB (ref 13.0–17.0)
MCH: 30 pg (ref 26.0–34.0)
MCHC: 33.8 g/dL (ref 30.0–36.0)
MCV: 88.9 fL (ref 78.0–100.0)
Platelets: 159 10*3/uL (ref 150–400)
RBC: 3.5 MIL/uL — ABNORMAL LOW (ref 4.22–5.81)
RDW: 15.4 % (ref 11.5–15.5)
WBC: 9.3 10*3/uL (ref 4.0–10.5)

## 2016-10-25 LAB — PROCALCITONIN: PROCALCITONIN: 0.17 ng/mL

## 2016-10-25 LAB — BASIC METABOLIC PANEL
Anion gap: 9 (ref 5–15)
BUN: 18 mg/dL (ref 6–20)
CALCIUM: 8.6 mg/dL — AB (ref 8.9–10.3)
CHLORIDE: 106 mmol/L (ref 101–111)
CO2: 22 mmol/L (ref 22–32)
CREATININE: 1.07 mg/dL (ref 0.61–1.24)
GFR calc Af Amer: >60 mL/min (ref >60)
GFR calc non Af Amer: 59 mL/min — ABNORMAL LOW (ref >60)
Glucose, Bld: 151 mg/dL — ABNORMAL HIGH (ref 65–99)
Potassium: 4.4 mmol/L (ref 3.5–5.1)
SODIUM: 137 mmol/L (ref 135–145)

## 2016-10-25 LAB — LACTIC ACID, PLASMA
LACTIC ACID, VENOUS: 1.2 mmol/L (ref 0.5–1.9)
Lactic Acid, Venous: 1 mmol/L (ref 0.5–1.9)

## 2016-10-25 LAB — GLUCOSE, CAPILLARY
Glucose-Capillary: 129 mg/dL — ABNORMAL HIGH (ref 65–99)
Glucose-Capillary: 162 mg/dL — ABNORMAL HIGH (ref 65–99)
Glucose-Capillary: 187 mg/dL — ABNORMAL HIGH (ref 65–99)

## 2016-10-25 MED ORDER — ASPIRIN 81 MG PO CHEW
81.0000 mg | CHEWABLE_TABLET | Freq: Every day | ORAL | Status: DC
  Administered 2016-10-25 – 2016-10-27 (×3): 81 mg via ORAL
  Filled 2016-10-25 (×3): qty 1

## 2016-10-25 MED ORDER — ACETAMINOPHEN 325 MG PO TABS: 650.0000 mg | ORAL_TABLET | Freq: Four times a day (QID) | ORAL | Status: DC | PRN

## 2016-10-25 MED ORDER — ONDANSETRON HCL 4 MG/2ML IJ SOLN: 4.0000 mg | Freq: Four times a day (QID) | INTRAMUSCULAR | Status: DC | PRN

## 2016-10-25 MED ORDER — INSULIN ASPART 100 UNIT/ML ~~LOC~~ SOLN
0.0000 [IU] | Freq: Three times a day (TID) | SUBCUTANEOUS | Status: DC
  Administered 2016-10-25: 2 [IU] via SUBCUTANEOUS
  Administered 2016-10-26: 1 [IU] via SUBCUTANEOUS
  Administered 2016-10-26 – 2016-10-27 (×4): 2 [IU] via SUBCUTANEOUS

## 2016-10-25 MED ORDER — ACETAMINOPHEN 650 MG RE SUPP: 650.0000 mg | Freq: Four times a day (QID) | RECTAL | Status: DC | PRN

## 2016-10-25 MED ORDER — LEVOTHYROXINE SODIUM 50 MCG PO TABS
50.0000 ug | ORAL_TABLET | Freq: Every day | ORAL | Status: DC
  Administered 2016-10-25 – 2016-10-27 (×3): 50 ug via ORAL
  Filled 2016-10-25 (×3): qty 1

## 2016-10-25 MED ORDER — ONDANSETRON HCL 4 MG PO TABS: 4.0000 mg | ORAL_TABLET | Freq: Four times a day (QID) | ORAL | Status: DC | PRN

## 2016-10-25 MED ORDER — PANTOPRAZOLE SODIUM 40 MG PO TBEC
40.0000 mg | DELAYED_RELEASE_TABLET | Freq: Every day | ORAL | Status: DC
  Administered 2016-10-25 – 2016-10-27 (×3): 40 mg via ORAL
  Filled 2016-10-25 (×3): qty 1

## 2016-10-25 MED ORDER — ENOXAPARIN SODIUM 40 MG/0.4ML ~~LOC~~ SOLN
40.0000 mg | SUBCUTANEOUS | Status: DC
Start: 2016-10-25 — End: 2016-10-27
  Administered 2016-10-25 – 2016-10-27 (×3): 40 mg via SUBCUTANEOUS
  Filled 2016-10-25 (×3): qty 0.4

## 2016-10-25 MED ORDER — FOLIC ACID 1 MG PO TABS
1.0000 mg | ORAL_TABLET | Freq: Every day | ORAL | Status: DC
  Administered 2016-10-25 – 2016-10-27 (×3): 1 mg via ORAL
  Filled 2016-10-25 (×3): qty 1

## 2016-10-25 MED ORDER — INSULIN ASPART 100 UNIT/ML ~~LOC~~ SOLN: 0.0000 [IU] | Freq: Every day | SUBCUTANEOUS | Status: DC

## 2016-10-25 NOTE — Progress Notes (Signed)
Patient seen and evaluated earlier this am. Please refer to H and P regarding details for plan of care.   Awaiting viral respiratory panel. Continue current treatment plan  Gen: pt in nad, alert and awake CV: no cyanosis, s1 and s2 present Pulm: no wheezes, [place] in place and patient breathing comfortably  Will reassess next am.  Velvet Bathe

## 2016-10-25 NOTE — H&P (Addendum)
History and Physical    [name] OTL:[id number] DOB: 07-22-1924 DOA: 10/24/2016  Referring MD/NP/PA: Dr. Loleta Books PCP: Leeanne Rio, PA-C  Patient coming from: [facility]  Chief Complaint: Fever  HPI: Gerald Holt is a 83 y.o. male with medical history significant of DM type 2, CVA, UTIs, PAF, self in&out catherization; who presented with complaints of subjective fever and with cold chills shaking since about 1 PM yesterday afternoon. Wife noted that he's had   associated symptoms of some confusion and mild nonproductive cough. Patient denied having any chest pain, shortness of breath, diarrhea, abdominal pain, nausea, or vomiting. Patient at baseline has to in and out catheterize himself and has had frequent UTIs noted in the past per family.  ED Course: Upon admission patient found to be febrile to 102.43F, pulse 66-92, respirations 16-34, blood pressure as low as 94/50, O2 saturations 91-96%.  Lab work revealed Na  132, K 5.1, Cr 1.19 (baseline 1.02), WBC 12.1K, Hgb12.2, lactate 4.38. UA + with pyuria. CXR interstitial prominence question infectious versus inflammatory. Got vanc/Zosyn, 30 cc/kg, cultures obtained. Transferred to a stepdown bed to hypotension.  Review of Systems: As per HPI otherwise 10 point review of systems negative.   Past Medical History:  Diagnosis Date  . A-fib ([place])   . Cataracts, bilateral   . Chronic dermatitis   . Diabetes type 2, controlled ([place]) 2000  . History of chicken pox   . Mumps    Adult  . Retinopathy   . Shingles   . Stroke [facility]) Nov 2011  . Undescended testicle, unilateral    Right  . Whooping cough     Past Surgical History:  Procedure Laterality Date  . PRE-MALIGNANT / BENIGN SKIN LESION EXCISION    . TESTICLE SURGERY     Right, undescended  . TOOTH EXTRACTION       reports that he has quit smoking. He has never used smokeless tobacco. He reports that he does not drink alcohol or use drugs.  Allergies  Allergen Reactions    . Flomax [Tamsulosin Hcl] Other (See Comments)    Hypotension  . Shellfish Allergy Anaphylaxis  . Aspirin-Dipyridamole Er     Headaches   . Other Other (See Comments)    Blood Thinner given in Rehab gave H/As - Not Coumadin/Headaches      Family History  Problem Relation Age of Onset  . Pneumonia Mother 54    Deceased  . GI Bleed Father 72    Deceased - Ulcers  . Diabetes Cousin   . Diabetes Brother   . Cancer Paternal Aunt     Prior to Admission medications   Medication Sig Start Date End Date Taking? Authorizing Provider  aspirin 81 MG tablet Take 1 tablet (81 mg total) by mouth daily. 06/22/15   Jerline Pain, MD  Calcium Carbonate Antacid (ALKA-SELTZER ANTACID PO) Take by mouth.    Historical Provider, MD  clonazePAM (KLONOPIN) 0.5 MG tablet Take 1 tablet (0.5 mg total) by mouth 2 (two) times daily as needed (agitation and insomnia). 04/01/16   [name], PA-C  cyanocobalamin (,VITAMIN B-12,) 1000 MCG/ML injection Inject 1000 mcg intramuscular daily for 7 days, then once a week for 4 weeks, then once a month for a year Patient taking differently: every 30 (thirty) days. Inject 1000 mcg intramuscular daily for 7 days, then once a week for 4 weeks, then once a month for a year 04/02/16   Pieter Partridge, DO  docusate sodium (COLACE) 100  MG capsule Take 200 mg by mouth.     Historical Provider, MD  Eyelid Cleansers (STERILID EX) Apply topically. Eye Wash: Once Daily    Historical Provider, MD  folic acid (FOLVITE) 1 MG tablet Take 1 mg by mouth daily.    Historical Provider, MD  glipiZIDE (GLUCOTROL XL) 2.5 MG 24 hr tablet Take 1 tablet (2.5 mg total) by mouth daily with breakfast. 10/17/16   [name], PA-C  glucose blood test strip Use one strip daily to check blood sugars for diabetes 10/17/16   [name], PA-C  levothyroxine (SYNTHROID, LEVOTHROID) 50 MCG tablet Take 1 tablet (50 mcg total) by mouth daily. 04/09/16   [name], PA-C  lisinopril  (PRINIVIL,ZESTRIL) 2.5 MG tablet Take 1 tablet (2.5 mg total) by mouth daily. 04/09/16   [name], PA-C  loratadine (CLARITIN) 10 MG tablet Take 10 mg by mouth daily.    Historical Provider, MD  lovastatin (MEVACOR) 10 MG tablet Take 10 mg by mouth at bedtime.  08/21/16   Historical Provider, MD  metFORMIN (GLUCOPHAGE-XR) 500 MG 24 hr tablet Take 2 tablets with breakfast and at bedtime Patient taking differently: 2,000 mg. Take 2 tablets with breakfast and at bedtime 08/21/16   [name], PA-C  methotrexate (RHEUMATREX) 2.5 MG tablet Take 10 mg by mouth once a week. Caution:Chemotherapy. Protect from light.     Historical Provider, MD  metoprolol tartrate (LOPRESSOR) 25 MG tablet Take 0.5 tablets (12.5 mg total) by mouth 2 (two) times daily. 04/09/16   [name], PA-C  Miconazole Nitrate (TRIPLE PASTE AF) 2 % OINT Apply topically.    Historical Provider, MD  NEEDLE, DISP, 25 G 25G X 1-1/2" MISC 1 each by Does not apply route as directed. Use to draw and inject B12 04/02/16   Pieter Partridge, DO  Omega-3 Fatty Acids (SB OMEGA-3 FISH OIL) 1000 MG CAPS Take 1 capsule by mouth 2 (two) times daily. Reported on 10/06/2015    Historical Provider, MD  OVER THE COUNTER MEDICATION Take 1 each by mouth 2 (two) times daily. Focus Select Supplement: Vitamin C & E, Zinc & Copper    Historical Provider, MD  phenazopyridine (PYRIDIUM) 200 MG tablet Take 1 tablet (200 mg total) by mouth 3 (three) times daily. 10/01/16   Deno Etienne, DO  Syringe, Disposable, 3 ML MISC 1 each by Does not apply route as directed. 04/02/16   Pieter Partridge, DO  triamcinolone cream (KENALOG) 0.1 % Apply 1 application topically as needed.  02/01/16   Historical Provider, MD  valACYclovir (VALTREX) 500 MG tablet Take 500 mg by mouth 2 (two) times daily. Take for 10 days for shingles    Historical Provider, MD    Physical Exam:  Constitutional: Elderly male who appears sick, but nontoxic at this time. Vitals:   10/24/16 2130  10/24/16 2146 10/24/16 2200 10/24/16 2305  BP: 98/57  (!) 100/53 126/78  Pulse: 69  66   Resp:   16 20  Temp:  99.2 F (37.3 C)  98.3 F (36.8 C)  TempSrc:  Rectal  Oral  SpO2: 98%  97% 96%  Weight:    70.8 kg (156 lb 1.4 oz)  Height:    '5\' 6"'  (1.676 m)   Eyes: PERRL, lids and conjunctivae normal ENMT: Mucous membranes are dry. Posterior pharynx clear of any exudate or lesions.  Hard of hearing.  Neck: normal, supple, no masses, no thyromegaly Respiratory: clear to auscultation bilaterally, no  wheezing, no crackles. Normal respiratory effort. No accessory muscle use.  Cardiovascular: Regular rate and rhythm, no murmurs / rubs / gallops. No extremity edema. 2+ pedal pulses. No carotid bruits.  Abdomen: no tenderness, no masses palpated. No hepatosplenomegaly. Bowel sounds positive.  Genitourinary:Foley catheter in place. Musculoskeletal: no clubbing / cyanosis. No joint deformity upper and lower extremities. Good ROM, no contractures. Normal muscle tone.  Skin: no rashes, lesions, ulcers. No induration Neurologic: CN 2-12 grossly intact. Sensation intact, DTR normal. Strength 5/5 in all 4.  Psychiatric: Normal judgment and insight. Alert and oriented x 3. Normal mood.     Labs on Admission: I have personally reviewed following labs and imaging studies  CBC:  Recent Labs Lab 10/18/16 1149 10/24/16 1708  WBC 8.5 12.1*  NEUTROABS 5.1 8.8*  HGB 11.9* 12.2*  HCT 35.4* 35.7*  MCV 89.8 89.9  PLT 211.0 147   Basic Metabolic Panel:  Recent Labs Lab 10/24/16 1708  NA 132*  K 5.1  CL 98*  CO2 23  GLUCOSE 202*  BUN 25*  CREATININE 1.19  CALCIUM 9.4   GFR: Estimated Creatinine Clearance: 37.2 mL/min (by C-G formula based on SCr of 1.19 mg/dL). Liver Function Tests:  Recent Labs Lab 10/18/16 1149 10/24/16 1708  AST 56* 56*  ALT 77* 78*  ALKPHOS 58 59  BILITOT 0.5 0.6  PROT 6.9 7.2  ALBUMIN 4.0 3.7   No results for input(s): LIPASE, AMYLASE in the last 168  hours. No results for input(s): AMMONIA in the last 168 hours. Coagulation Profile: No results for input(s): INR, PROTIME in the last 168 hours. Cardiac Enzymes: No results for input(s): CKTOTAL, CKMB, CKMBINDEX, TROPONINI in the last 168 hours. BNP (last 3 results) No results for input(s): PROBNP in the last 8760 hours. HbA1C: No results for input(s): HGBA1C in the last 72 hours. CBG: No results for input(s): GLUCAP in the last 168 hours. Lipid Profile: No results for input(s): CHOL, HDL, LDLCALC, TRIG, CHOLHDL, LDLDIRECT in the last 72 hours. Thyroid Function Tests: No results for input(s): TSH, T4TOTAL, FREET4, T3FREE, THYROIDAB in the last 72 hours. Anemia Panel: No results for input(s): VITAMINB12, FOLATE, FERRITIN, TIBC, IRON, RETICCTPCT in the last 72 hours. Urine analysis:    Component Value Date/Time   COLORURINE YELLOW 10/24/2016 1747   APPEARANCEUR CLOUDY (A) 10/24/2016 1747   LABSPEC 1.013 10/24/2016 1747   PHURINE 6.5 10/24/2016 1747   GLUCOSEU NEGATIVE 10/24/2016 1747   GLUCOSEU NEGATIVE 10/17/2016 1533   HGBUR SMALL (A) 10/24/2016 1747   BILIRUBINUR NEGATIVE 10/24/2016 1747   KETONESUR NEGATIVE 10/24/2016 1747   PROTEINUR 30 (A) 10/24/2016 1747   UROBILINOGEN 0.2 10/17/2016 1533   NITRITE NEGATIVE 10/24/2016 1747   LEUKOCYTESUR LARGE (A) 10/24/2016 1747   Sepsis Labs: Recent Results (from the past 240 hour(s))  Urine Culture     Status: None   Collection Time: 10/17/16  3:33 PM  Result Value Ref Range Status   Organism ID, Bacteria NO GROWTH  Final     Radiological Exams on Admission: Dg Chest Port 1 View  Result Date: 10/24/2016 CLINICAL DATA:  Sepsis EXAM: PORTABLE CHEST 1 VIEW COMPARISON:  05/30/2016 FINDINGS: Diffuse interstitial prominence, new. This could represent an infectious pneumonitis, but noninfectious inflammatory or fibrotic processes are also possible. Interstitial edema appears less likely, given the lack of accompanying vascular changes.  No airspace consolidation. No effusions. Hilar, mediastinal and cardiac contours are unremarkable. IMPRESSION: Diffuse interstitial prominence, possibly infectious or inflammatory. Electronically Signed   By: Shaune Pascal  Alroy Dust M.D.   On: 10/24/2016 18:40    EKG: Independently reviewed. Sinus rhythm with premature atrial complex unchanged from previous tracings.   Assessment/Plan Sepsis 2/2 UTI: Acute. Patient presents with fever, tachycardia, tachypneic, and hypotensive on admission.. Patient met sepsis criteria and was noted to have lactic acid of >4 with WBC 12.1. UA was noted to be positive for signs of infection. Patient also noted to have cough with abnormal chest x-ray. - Admitted to stepdown  - Sepsis protocol initiated - Continue empiric Vancomycin and Zosyn; de-escalate antibiotic therapy when medically appropriate  - Follow-up blood and urine cultures - Trend lactic acid levels - Tylenol prn fever  Transient hypotension: Improved. after initial IV fluid resuscitation. - Held initial blood pressure medications due to hypotension - restart blood pressure medications when medically appropriate.  Upper respiratory infection with Abnormal chest x-ray: Patient found to be significantly tachypneic with note of cough and possible inflammatory versus infectious changes noted on CXR. - Check respiratory viral panel  Diabetes mellitus type 2 - Hypoglycemic protocols - Hold glipizide and metformin - CBGs with sensitive SSI  Renal insufficiency: Suspect secondary to prerenal cause given elevated BUN to creatinine ratio. Baseline creatinine noted to be 1.03, patient presents with a creatinine of 1.19 and a BUN of 25. - Repeat BMP in a.m.  Rheumatoid arthritis on methotrexate - Awaiting med reconciliation  PAF: Patient not anticoagulated secondary to increased risk of falls. CHADSVASC =6 - Continue aspirin    Hypothyroidism  - Continue levothyroxine  GI prophylaxis: Protonix  DVT  prophylaxis:  lovenox Code Status: DNR Family Communication: no family  present at bedside Disposition Plan: TBD  Consults called: None Admission status: Inpatient  Norval Morton MD [facility] Pager [phone]  If 7PM-7AM, please contact night-coverage [email or web address] Password [facility]  10/25/2016, 12:39 AM

## 2016-10-25 NOTE — Telephone Encounter (Signed)
Patient was supposed to be admitted today for home health services.  However, he is currently in the hospital admitted for sepsis.

## 2016-10-25 NOTE — Telephone Encounter (Signed)
FYI-Patient currently inpatient at [facility]. Dx: Sepsis secondary to UTI.

## 2016-10-26 LAB — GLUCOSE, CAPILLARY
GLUCOSE-CAPILLARY: 165 mg/dL — AB (ref 65–99)
GLUCOSE-CAPILLARY: 168 mg/dL — AB (ref 65–99)
GLUCOSE-CAPILLARY: 144 mg/dL — AB (ref 65–99)
Glucose-Capillary: 129 mg/dL — ABNORMAL HIGH (ref 65–99)

## 2016-10-26 MED ORDER — LEVOFLOXACIN 750 MG PO TABS
750.0000 mg | ORAL_TABLET | ORAL | Status: DC
  Administered 2016-10-26: 750 mg via ORAL
  Filled 2016-10-26: qty 1

## 2016-10-26 NOTE — Progress Notes (Signed)
PROGRESS NOTE    Gerald Holt  [id number] DOB: 07-18-1924 DOA: 10/24/2016 PCP: Leeanne Rio, PA-C   Brief Narrative:  83 y.o. male with medical history significant of DM type 2, CVA, UTIs, PAF, self in&out catherization; who presented with complaints of subjective fever and with cold chills shaking since about 1 PM yesterday afternoon.  Assessment & Plan:   Principal Problem:   Sepsis secondary to UTI ([place]) - narrowed to levaquin but may have to change antibiotics pending u/c results  Active Problems:   Diabetes mellitus type II, uncontrolled ([place]) -  Stable currently. Continue diabetic diet and SSI with night coverage    Paroxysmal atrial fibrillation ([facility]) - stable, continue aspirin. Rate controlled currently.    Hypothyroidism -Stable on Synthroid    Renal insufficiency - Serum creatinine within normal limits   DVT prophylaxis: Lovenox Code Status: DO NOT RESUSCITATE Family Communication: None at bedside Disposition Plan: Pending improvement in condition and results of urine culture. May have to switch antibiotic coverage   Consultants:   None   Procedures: Pending  Antimicrobials: Currently on Levaquin Subjective: Patient has no new complaints. No acute issues overnight  Objective: Vitals:   10/25/16 1657 10/25/16 2100 10/26/16 0623 10/26/16 0939  BP: 122/74 (!) 109/56 (!) 110/59 (!) 122/54  Pulse:  76 69 90  Resp:  20 18 18   Temp: 97.8 F (36.6 C) 98.3 F (36.8 C) 98.6 F (37 C) 97.7 F (36.5 C)  TempSrc: Oral Oral Oral Oral  SpO2:  94% 96% 94%  Weight:  70.8 kg (156 lb 1.6 oz)    Height:        Intake/Output Summary (Last 24 hours) at 10/26/16 1556 Last data filed at 10/26/16 1400  Gross per 24 hour  Intake              780 ml  Output             2925 ml  Net            -2145 ml   Filed Weights   10/24/16 1654 10/24/16 2305 10/25/16 2100  Weight: 72.1 kg (159 lb) 70.8 kg (156 lb 1.4 oz) 70.8 kg (156 lb 1.6 oz)     Examination:  General exam: Appears calm and comfortable , No acute distress Respiratory system: Clear to auscultation. Respiratory effort normal. Cardiovascular system: Irregularly irregular. No JVD Gastrointestinal system: Abdomen is nondistended, soft and nontender. No organomegaly or masses felt. Normal bowel sounds heard. Central nervous system: Alert and oriented. No focal neurological deficits. Extremities: Symmetric 5 x 5 power. Skin: No rashes, lesions or ulcers, on limited exam Psychiatry: Mood & affect appropriate.   Data Reviewed: I have personally reviewed following labs and imaging studies  CBC:  Recent Labs Lab 10/24/16 1708 10/25/16 0109  WBC 12.1* 9.3  NEUTROABS 8.8*  --   HGB 12.2* 10.5*  HCT 35.7* 31.1*  MCV 89.9 88.9  PLT 202 [id number]   Basic Metabolic Panel:  Recent Labs Lab 10/24/16 1708 10/25/16 0109  NA 132* 137  K 5.1 4.4  CL 98* 106  CO2 23 22  GLUCOSE 202* 151*  BUN 25* 18  CREATININE 1.19 1.07  CALCIUM 9.4 8.6*   GFR: Estimated Creatinine Clearance: 41.4 mL/min (by C-G formula based on SCr of 1.07 mg/dL). Liver Function Tests:  Recent Labs Lab 10/24/16 1708  AST 56*  ALT 78*  ALKPHOS 59  BILITOT 0.6  PROT 7.2  ALBUMIN 3.7   No results for input(s):  LIPASE, AMYLASE in the last 168 hours. No results for input(s): AMMONIA in the last 168 hours. Coagulation Profile: No results for input(s): INR, PROTIME in the last 168 hours. Cardiac Enzymes: No results for input(s): CKTOTAL, CKMB, CKMBINDEX, TROPONINI in the last 168 hours. BNP (last 3 results) No results for input(s): PROBNP in the last 8760 hours. HbA1C: No results for input(s): HGBA1C in the last 72 hours. CBG:  Recent Labs Lab 10/25/16 1348 10/25/16 1652 10/25/16 2051 10/26/16 0747 10/26/16 1145  GLUCAP 129* 162* 187* 165* 168*   Lipid Profile: No results for input(s): CHOL, HDL, LDLCALC, TRIG, CHOLHDL, LDLDIRECT in the last 72 hours. Thyroid Function  Tests: No results for input(s): TSH, T4TOTAL, FREET4, T3FREE, THYROIDAB in the last 72 hours. Anemia Panel: No results for input(s): VITAMINB12, FOLATE, FERRITIN, TIBC, IRON, RETICCTPCT in the last 72 hours. Sepsis Labs:  Recent Labs Lab 10/24/16 1723 10/24/16 2019 10/25/16 0109 10/25/16 0356  PROCALCITON  --   --  0.17  --   LATICACIDVEN 4.38* 2.39* 1.2 1.0    Recent Results (from the past 240 hour(s))  Urine Culture     Status: None   Collection Time: 10/17/16  3:33 PM  Result Value Ref Range Status   Organism ID, Bacteria NO GROWTH  Final  Blood Culture (routine x 2)     Status: None (Preliminary result)   Collection Time: 10/24/16  5:00 PM  Result Value Ref Range Status   Specimen Description BLOOD LEFT ANTECUBITAL  Final   Special Requests   Final    BOTTLES DRAWN AEROBIC AND ANAEROBIC 5CC BOTH BOTTLES   Culture   Final    NO GROWTH 2 DAYS Performed at [facility], [place] [address], [place], [place] [address]    Report Status PENDING  Incomplete  Blood Culture (routine x 2)     Status: None (Preliminary result)   Collection Time: 10/24/16  5:10 PM  Result Value Ref Range Status   Specimen Description BLOOD LEFT FOREARM  Final   Special Requests   Final    BOTTLES DRAWN AEROBIC AND ANAEROBIC 5CC BOTH BOTTLES   Culture   Final    NO GROWTH 2 DAYS Performed at [facility], [place] [address], [place], [place] [address]    Report Status PENDING  Incomplete  Urine culture     Status: Abnormal (Preliminary result)   Collection Time: 10/24/16  5:47 PM  Result Value Ref Range Status   Specimen Description URINE, CLEAN CATCH  Final   Special Requests NONE  Final   Culture (A)  Final    >=100,000 COLONIES/mL ESCHERICHIA COLI SUSCEPTIBILITIES TO FOLLOW Performed at [facility], [address] [place],  [address]    Report Status PENDING  Incomplete  MRSA PCR Screening     Status: None   Collection Time: 10/24/16 11:09 PM  Result Value Ref Range  Status   MRSA by PCR NEGATIVE NEGATIVE Final    Comment:        The GeneXpert MRSA Assay (FDA approved for NASAL specimens only), is one component of a comprehensive MRSA colonization surveillance program. It is not intended to diagnose MRSA infection nor to guide or monitor treatment for MRSA infections.   Respiratory Panel by PCR     Status: None   Collection Time: 10/25/16 12:44 AM  Result Value Ref Range Status   Adenovirus NOT DETECTED NOT DETECTED Final   Coronavirus 229E NOT DETECTED NOT DETECTED Final  Coronavirus HKU1 NOT DETECTED NOT DETECTED Final   Coronavirus NL63 NOT DETECTED NOT DETECTED Final   Coronavirus OC43 NOT DETECTED NOT DETECTED Final   Metapneumovirus NOT DETECTED NOT DETECTED Final   Rhinovirus / Enterovirus NOT DETECTED NOT DETECTED Final   Influenza A NOT DETECTED NOT DETECTED Final   Influenza B NOT DETECTED NOT DETECTED Final   Parainfluenza Virus 1 NOT DETECTED NOT DETECTED Final   Parainfluenza Virus 2 NOT DETECTED NOT DETECTED Final   Parainfluenza Virus 3 NOT DETECTED NOT DETECTED Final   Parainfluenza Virus 4 NOT DETECTED NOT DETECTED Final   Respiratory Syncytial Virus NOT DETECTED NOT DETECTED Final   Bordetella pertussis NOT DETECTED NOT DETECTED Final   Chlamydophila pneumoniae NOT DETECTED NOT DETECTED Final   Mycoplasma pneumoniae NOT DETECTED NOT DETECTED Final         Radiology Studies: Dg Chest Port 1 View  Result Date: 10/24/2016 CLINICAL DATA:  Sepsis EXAM: PORTABLE CHEST 1 VIEW COMPARISON:  05/30/2016 FINDINGS: Diffuse interstitial prominence, new. This could represent an infectious pneumonitis, but noninfectious inflammatory or fibrotic processes are also possible. Interstitial edema appears less likely, given the lack of accompanying vascular changes. No airspace consolidation. No effusions. Hilar, mediastinal and cardiac contours are unremarkable. IMPRESSION: Diffuse interstitial prominence, possibly infectious or  inflammatory. Electronically Signed   By: Andreas Newport M.D.   On: 10/24/2016 18:40        Scheduled Meds: . aspirin  81 mg Oral Daily  . enoxaparin (LOVENOX) injection  40 mg Subcutaneous Q24H  . folic acid  1 mg Oral Daily  . insulin aspart  0-5 Units Subcutaneous QHS  . insulin aspart  0-9 Units Subcutaneous TID WC  . levofloxacin  750 mg Oral Q48H  . levothyroxine  50 mcg Oral QAC breakfast  . pantoprazole  40 mg Oral Daily   Continuous Infusions:   LOS: 2 days    Time spent: > 35 minutes  Velvet Bathe, MD [facility] Pager [phone]  If 7PM-7AM, please contact night-coverage [email or web address] Password [facility] 10/26/2016, 3:56 PM

## 2016-10-26 NOTE — Progress Notes (Signed)
Pharmacy Antibiotic Note  Gerald Holt is a 83 y.o. male admitted on 10/24/2016 with sepsis 2/2 CAP and E Coli UTI. Pt was empirically started on vanc/zosyn per pharmacy however with culture data, now narrowing to levofloxacin. Pt now afebrile, WBC down now wnl. LA normalized. SCr down to 1.07, CrCl ~40.   Plan: D/c vanc and zosyn Start levofloxacin 750 mg PO q48h F/u LOT, E Coli sensitivities Monitor clinical picture, CBC, Tmax     Height: 5\' 6"  (167.6 cm) Weight: 156 lb 1.6 oz (70.8 kg) IBW/kg (Calculated) : 63.8  Temp (24hrs), Avg:98.1 F (36.7 C), Min:97.7 F (36.5 C), Max:98.6 F (37 C)   Recent Labs Lab 10/24/16 1708 10/24/16 1723 10/24/16 2019 10/25/16 0109 10/25/16 0356  WBC 12.1*  --   --  9.3  --   CREATININE 1.19  --   --  1.07  --   LATICACIDVEN  --  4.38* 2.39* 1.2 1.0    Estimated Creatinine Clearance: 41.4 mL/min (by C-G formula based on SCr of 1.07 mg/dL).    Allergies  Allergen Reactions  . Flomax [Tamsulosin Hcl] Other (See Comments)    Hypotension  . Shellfish Allergy Anaphylaxis  . Aspirin-Dipyridamole Er Other (See Comments)    Headaches   . Other Other (See Comments)    Blood Thinner given in Rehab gave H/As - Not Coumadin/Headaches      Antimicrobials this admission: 2/1 Vancomycin >> 2/3 2/1 Zosyn >> 2/3 2/3 Levofloxacin >>  Dose adjustments this admission: N/a  Microbiology results: 21/ blood x2>> ngtd 2/1 urine >> E Coli 2/2 Resp panel - negative  Thank you for allowing pharmacy to be a part of this patient's care.  Carlean Jews, Pharm.D. PGY1 Pharmacy Resident 2/3/201811:14 AM Pager [phone]

## 2016-10-27 ENCOUNTER — Encounter: Payer: Self-pay | Admitting: Physician Assistant

## 2016-10-27 LAB — URINE CULTURE: Culture: >=100000 — AB

## 2016-10-27 LAB — BASIC METABOLIC PANEL
ANION GAP: 8 (ref 5–15)
BUN: 13 mg/dL (ref 6–20)
CHLORIDE: 103 mmol/L (ref 101–111)
CO2: 23 mmol/L (ref 22–32)
CREATININE: 1.17 mg/dL (ref 0.61–1.24)
Calcium: 9.1 mg/dL (ref 8.9–10.3)
GFR calc non Af Amer: 53 mL/min — ABNORMAL LOW (ref >60)
GLUCOSE: 165 mg/dL — AB (ref 65–99)
Potassium: 4.2 mmol/L (ref 3.5–5.1)
Sodium: 134 mmol/L — ABNORMAL LOW (ref 135–145)

## 2016-10-27 LAB — CBC
HEMATOCRIT: 32.1 % — AB (ref 39.0–52.0)
HEMOGLOBIN: 10.9 g/dL — AB (ref 13.0–17.0)
MCH: 29.8 pg (ref 26.0–34.0)
MCHC: 34 g/dL (ref 30.0–36.0)
MCV: 87.7 fL (ref 78.0–100.0)
Platelets: 199 10*3/uL (ref 150–400)
RBC: 3.66 MIL/uL — ABNORMAL LOW (ref 4.22–5.81)
RDW: 15.5 % (ref 11.5–15.5)
WBC: 6.9 10*3/uL (ref 4.0–10.5)

## 2016-10-27 LAB — GLUCOSE, CAPILLARY
GLUCOSE-CAPILLARY: 166 mg/dL — AB (ref 65–99)
Glucose-Capillary: 164 mg/dL — ABNORMAL HIGH (ref 65–99)

## 2016-10-27 MED ORDER — CIPROFLOXACIN HCL 500 MG PO TABS: 500.0000 mg | ORAL_TABLET | Freq: Two times a day (BID) | ORAL | 0 refills | Status: DC

## 2016-10-27 MED ORDER — CIPROFLOXACIN HCL 500 MG PO TABS
500.0000 mg | ORAL_TABLET | Freq: Two times a day (BID) | ORAL | Status: DC
  Administered 2016-10-27: 500 mg via ORAL

## 2016-10-27 NOTE — Discharge Summary (Signed)
Physician Discharge Summary  Gerald Holt [id number] DOB: [birth year] DOA: 10/24/2016  PCP: Leeanne Rio, PA-C  Admit date: 10/24/2016 Discharge date: 10/27/2016  Time spent: > 35 minutes  Recommendations for Outpatient Follow-up:  1. Patient will be discharged on 4 more days of antibiotics to complete a seven-day treatment course   Discharge Diagnoses:  Principal Problem:   Sepsis secondary to UTI [facility]) Active Problems:   Diabetes mellitus type II, uncontrolled ([place])   Paroxysmal atrial fibrillation ([place])   Hypothyroidism   Renal insufficiency   Discharge Condition: stable  Diet recommendation: carb modified diet  Filed Weights   10/24/16 2305 10/25/16 2100 10/26/16 2053  Weight: 70.8 kg (156 lb 1.4 oz) 70.8 kg (156 lb 1.6 oz) 69.8 kg (153 lb 12.8 oz)    History of present illness:  83 y.o.malewith medical history significant of DM type 2, CVA, UTIs, PAF, self in & out catherization; who presentedwith complaints ofsubjective fever and with cold chills shaking since about 1 PM yesterday afternoon.  Hospital Course:  Principal Problem:   Sepsis secondary to UTI ([place]) - We'll plan on discharge on Cipro urine culture growing Escherichia coli. - Blood cultures remain negative  Active Problems:   Diabetes mellitus type II, uncontrolled ([place]) -   continue diabetic diet patient to continue home medication regimen    Paroxysmal atrial fibrillation ([place]) -Stable continue home medication regimen    Hypothyroidism -Stable on Synthroid    Renal insufficiency - Serum creatinine within normal limits  Procedures:  None  Consultations:  None  Discharge Exam: Vitals:   10/27/16 0556 10/27/16 0924  BP: 122/64 124/62  Pulse: 88 100  Resp: 16 17  Temp: 98.7 F (37.1 C) 97.8 F (36.6 C)    General: Pt in nad, alert and awake Cardiovascular: rrr, no rubs Respiratory: no increased wob,no wheezes  Discharge Instructions   Discharge Instructions     Call MD for:  difficulty breathing, headache or visual disturbances    Complete by:  As directed    Call MD for:  redness, tenderness, or signs of infection (pain, swelling, redness, odor or green/yellow discharge around incision site)    Complete by:  As directed    Call MD for:  temperature >100.4    Complete by:  As directed    Diet - low sodium heart healthy    Complete by:  As directed    Discharge instructions    Complete by:  As directed    Please follow-up with your primary care physician within the next week or 2 or sooner should any new concerns arise.   Increase activity slowly    Complete by:  As directed      Current Discharge Medication List    START taking these medications   Details  ciprofloxacin (CIPRO) 500 MG tablet Take 1 tablet (500 mg total) by mouth 2 (two) times daily. Qty: 8 tablet, Refills: 0      CONTINUE these medications which have NOT CHANGED   Details  aspirin 81 MG tablet Take 1 tablet (81 mg total) by mouth daily.    Calcium Carbonate Antacid (ALKA-SELTZER ANTACID PO) Take 2 tablets by mouth as needed (for indegestion).     cyanocobalamin (,VITAMIN B-12,) 1000 MCG/ML injection Inject 1000 mcg intramuscular daily for 7 days, then once a week for 4 weeks, then once a month for a year Qty: 25 mL, Refills: 0   Associated Diagnoses: B12 deficiency    docusate sodium (COLACE) 100 MG capsule Take  100-200 mg by mouth See admin instructions. Take 100 mg by mouth daily for 2 days, then on every third day take 200 mg by mouth daily. Alternate 100 mg and 200 mg    Eyelid Cleansers (STERILID EX) Apply 1 application topically See admin instructions. Eye Wash: Once Daily     folic acid (FOLVITE) 1 MG tablet Take 1 mg by mouth at bedtime.     glipiZIDE (GLUCOTROL XL) 2.5 MG 24 hr tablet Take 1 tablet (2.5 mg total) by mouth daily with breakfast. Qty: 30 tablet, Refills: 5    levothyroxine (SYNTHROID, LEVOTHROID) 50 MCG tablet Take 1 tablet (50 mcg total)  by mouth daily. Qty: 90 tablet, Refills: 3    lisinopril (PRINIVIL,ZESTRIL) 2.5 MG tablet Take 1 tablet (2.5 mg total) by mouth daily. Qty: 90 tablet, Refills: 3    lovastatin (MEVACOR) 10 MG tablet Take 5 mg by mouth at bedtime.     metFORMIN (GLUCOPHAGE-XR) 500 MG 24 hr tablet Take 2 tablets with breakfast and at bedtime Qty: 360 tablet, Refills: 1    metoprolol tartrate (LOPRESSOR) 25 MG tablet Take 0.5 tablets (12.5 mg total) by mouth 2 (two) times daily. Qty: 180 tablet, Refills: 1    Miconazole Nitrate (TRIPLE PASTE AF) 2 % OINT Apply 1 application topically once a week.     Omega-3 Fatty Acids (SB OMEGA-3 FISH OIL) 1000 MG CAPS Take 1,000 mg by mouth 2 (two) times daily. Reported on 10/06/2015    OVER THE COUNTER MEDICATION Take 1 capsule by mouth 2 (two) times daily. OTC Focus Select    triamcinolone cream (KENALOG) 0.1 % Apply 1 application topically as needed (for itching).     valACYclovir (VALTREX) 1000 MG tablet Take 1,000 mg by mouth 2 (two) times daily. For 10 days    glucose blood test strip Use one strip daily to check blood sugars for diabetes Qty: 100 each, Refills: 12   Associated Diagnoses: Controlled type 2 diabetes mellitus without complication, without long-term current use of insulin (HCC)    NEEDLE, DISP, 25 G 25G X 1-1/2" MISC 1 each by Does not apply route as directed. Use to draw and inject B12 Qty: 50 each, Refills: 0    Syringe, Disposable, 3 ML MISC 1 each by Does not apply route as directed. Qty: 25 each, Refills: 0      STOP taking these medications     methotrexate (RHEUMATREX) 2.5 MG tablet      clonazePAM (KLONOPIN) 0.5 MG tablet      phenazopyridine (PYRIDIUM) 200 MG tablet        Allergies  Allergen Reactions  . Flomax [Tamsulosin Hcl] Other (See Comments)    Hypotension  . Shellfish Allergy Anaphylaxis  . Aspirin-Dipyridamole Er Other (See Comments)    Headaches   . Other Other (See Comments)    Blood Thinner given in Rehab  gave H/As - Not Coumadin/Headaches        The results of significant diagnostics from this hospitalization (including imaging, microbiology, ancillary and laboratory) are listed below for reference.    Significant Diagnostic Studies: Dg Chest Port 1 View  Result Date: 10/24/2016 CLINICAL DATA:  Sepsis EXAM: PORTABLE CHEST 1 VIEW COMPARISON:  05/30/2016 FINDINGS: Diffuse interstitial prominence, new. This could represent an infectious pneumonitis, but noninfectious inflammatory or fibrotic processes are also possible. Interstitial edema appears less likely, given the lack of accompanying vascular changes. No airspace consolidation. No effusions. Hilar, mediastinal and cardiac contours are unremarkable. IMPRESSION: Diffuse interstitial prominence, possibly  infectious or inflammatory. Electronically Signed   By: Andreas Newport M.D.   On: 10/24/2016 18:40    Microbiology: Recent Results (from the past 240 hour(s))  Urine Culture     Status: None   Collection Time: 10/17/16  3:33 PM  Result Value Ref Range Status   Organism ID, Bacteria NO GROWTH  Final  Blood Culture (routine x 2)     Status: None (Preliminary result)   Collection Time: 10/24/16  5:00 PM  Result Value Ref Range Status   Specimen Description BLOOD LEFT ANTECUBITAL  Final   Special Requests   Final    BOTTLES DRAWN AEROBIC AND ANAEROBIC 5CC BOTH BOTTLES   Culture   Final    NO GROWTH 3 DAYS Performed at [facility], [place] [address] [place], [place] [address]    Report Status PENDING  Incomplete  Blood Culture (routine x 2)     Status: None (Preliminary result)   Collection Time: 10/24/16  5:10 PM  Result Value Ref Range Status   Specimen Description BLOOD LEFT FOREARM  Final   Special Requests   Final    BOTTLES DRAWN AEROBIC AND ANAEROBIC 5CC BOTH BOTTLES   Culture   Final    NO GROWTH 3 DAYS Performed at [facility], [place] [address], [place], [place] [address]    Report Status PENDING  Incomplete   Urine culture     Status: Abnormal   Collection Time: 10/24/16  5:47 PM  Result Value Ref Range Status   Specimen Description URINE, CLEAN CATCH  Final   Special Requests NONE  Final   Culture >=100,000 COLONIES/mL ESCHERICHIA COLI (A)  Final   Report Status 10/27/2016 FINAL  Final   Organism ID, Bacteria ESCHERICHIA COLI (A)  Final      Susceptibility   Escherichia coli - MIC*    AMPICILLIN 8 SENSITIVE Sensitive     CEFAZOLIN <=4 SENSITIVE Sensitive     CEFTRIAXONE <=1 SENSITIVE Sensitive     CIPROFLOXACIN <=0.25 SENSITIVE Sensitive     GENTAMICIN <=1 SENSITIVE Sensitive     IMIPENEM <=0.25 SENSITIVE Sensitive     NITROFURANTOIN <=16 SENSITIVE Sensitive     TRIMETH/SULFA <=20 SENSITIVE Sensitive     AMPICILLIN/SULBACTAM 4 SENSITIVE Sensitive     PIP/TAZO <=4 SENSITIVE Sensitive     Extended ESBL NEGATIVE Sensitive     * >=100,000 COLONIES/mL ESCHERICHIA COLI  MRSA PCR Screening     Status: None   Collection Time: 10/24/16 11:09 PM  Result Value Ref Range Status   MRSA by PCR NEGATIVE NEGATIVE Final    Comment:        The GeneXpert MRSA Assay (FDA approved for NASAL specimens only), is one component of a comprehensive MRSA colonization surveillance program. It is not intended to diagnose MRSA infection nor to guide or monitor treatment for MRSA infections.   Respiratory Panel by PCR     Status: None   Collection Time: 10/25/16 12:44 AM  Result Value Ref Range Status   Adenovirus NOT DETECTED NOT DETECTED Final   Coronavirus 229E NOT DETECTED NOT DETECTED Final   Coronavirus HKU1 NOT DETECTED NOT DETECTED Final   Coronavirus NL63 NOT DETECTED NOT DETECTED Final   Coronavirus OC43 NOT DETECTED NOT DETECTED Final   Metapneumovirus NOT DETECTED NOT DETECTED Final   Rhinovirus / Enterovirus NOT DETECTED NOT DETECTED Final   Influenza A NOT DETECTED NOT DETECTED Final   Influenza B NOT DETECTED NOT DETECTED Final  Parainfluenza Virus 1 NOT DETECTED NOT DETECTED Final    Parainfluenza Virus 2 NOT DETECTED NOT DETECTED Final   Parainfluenza Virus 3 NOT DETECTED NOT DETECTED Final   Parainfluenza Virus 4 NOT DETECTED NOT DETECTED Final   Respiratory Syncytial Virus NOT DETECTED NOT DETECTED Final   Bordetella pertussis NOT DETECTED NOT DETECTED Final   Chlamydophila pneumoniae NOT DETECTED NOT DETECTED Final   Mycoplasma pneumoniae NOT DETECTED NOT DETECTED Final     Labs: Basic Metabolic Panel:  Recent Labs Lab 10/24/16 1708 10/25/16 0109 10/27/16 0442  NA 132* 137 134*  K 5.1 4.4 4.2  CL 98* 106 103  CO2 23 22 23   GLUCOSE 202* 151* 165*  BUN 25* 18 13  CREATININE 1.19 1.07 1.17  CALCIUM 9.4 8.6* 9.1   Liver Function Tests:  Recent Labs Lab 10/24/16 1708  AST 56*  ALT 78*  ALKPHOS 59  BILITOT 0.6  PROT 7.2  ALBUMIN 3.7   No results for input(s): LIPASE, AMYLASE in the last 168 hours. No results for input(s): AMMONIA in the last 168 hours. CBC:  Recent Labs Lab 10/24/16 1708 10/25/16 0109 10/27/16 0442  WBC 12.1* 9.3 6.9  NEUTROABS 8.8*  --   --   HGB 12.2* 10.5* 10.9*  HCT 35.7* 31.1* 32.1*  MCV 89.9 88.9 87.7  PLT 202 159 199   Cardiac Enzymes: No results for input(s): CKTOTAL, CKMB, CKMBINDEX, TROPONINI in the last 168 hours. BNP: BNP (last 3 results) No results for input(s): BNP in the last 8760 hours.  ProBNP (last 3 results) No results for input(s): PROBNP in the last 8760 hours.  CBG:  Recent Labs Lab 10/26/16 1145 10/26/16 1654 10/26/16 2108 10/27/16 0806 10/27/16 1159  GLUCAP 168* 144* 129* 164* 166*    Signed:  Velvet Bathe MD.  [facility] 10/27/2016, 1:38 PM

## 2016-10-27 NOTE — Evaluation (Signed)
Physical Therapy Evaluation Patient Details Name: Gerald Holt MRN: [id number] DOB: 11-Jan-1924 Today's Date: 10/27/2016   History of Present Illness  83 y.o. male with medical history significant of DM type 2, CVA, UTIs, PAF, self in&out catherization; who presented with complaints of subjective fever and with cold chills shaking; Sepsis secondary to UTI  Clinical Impression   Pt admitted with above diagnosis. Pt currently with functional limitations due to the deficits listed below (see PT Problem List). Presents with generalized weakness; Strongly recommend use of RW for amb at this time, and recommend HHPT follow up to facilitate return to PLOF;  Pt will benefit from skilled PT to increase their independence and safety with mobility to allow discharge to the venue listed below.        Follow Up Recommendations Home health PT;Supervision/Assistance - 24 hour    Equipment Recommendations  Rolling walker with 5" wheels;3in1 (PT) (may already have)    Recommendations for Other Services       Precautions / Restrictions Precautions Precautions: Fall      Mobility  Bed Mobility Overal bed mobility: Needs Assistance Bed Mobility: Supine to Sit     Supine to sit: Supervision     General bed mobility comments: Slow moving and used rails to scoot hips to EOB; no physical assist needed  Transfers Overall transfer level: Needs assistance Equipment used: Rolling walker (2 wheeled) Transfers: Sit to/from Stand Sit to Stand: Min guard         General transfer comment: Close guard for safety; Cues for hand pacement; Noted pt braced backs of LEs against bed for stability during rise  Ambulation/Gait Ambulation/Gait assistance: Min guard (with and wihtout physical contact) Ambulation Distance (Feet): 100 Feet Assistive device: Rolling walker (2 wheeled) Gait Pattern/deviations: Step-through pattern;Decreased stride length;Trunk flexed     General Gait Details: Cues for RW  proximity and posture  Stairs            Wheelchair Mobility    Modified Rankin (Stroke Patients Only)       Balance                                             Pertinent Vitals/Pain Pain Assessment: No/denies pain    Home Living Family/patient expects to be discharged to:: Private residence Living Arrangements: Spouse/significant other (and sister in Sports coach) Available Help at Discharge: Family;Personal care attendant (Assist in home M-F most of day, if not all) Type of Home: House Home Access: Stairs to enter   [employer] of Steps: 1 Home Layout: One level Home Equipment: Environmental consultant - 2 wheels      Prior Function Level of Independence: Needs assistance   Gait / Transfers Assistance Needed: Walks without assistive device; tells me HHPT had somewhat recently helped him wean off of cane  ADL's / Homemaking Assistance Needed: [facility] home [facility]        Hand Dominance        Extremity/Trunk Assessment   Upper Extremity Assessment Upper Extremity Assessment: Generalized weakness    Lower Extremity Assessment Lower Extremity Assessment: Generalized weakness       Communication   Communication: HOH (VERY)  Cognition Arousal/Alertness: Awake/alert Behavior During Therapy: WFL for tasks assessed/performed Overall Cognitive Status: Within Functional Limits for tasks assessed (for simple mobility tasks; noted h/o mild dementia)  General Comments      Exercises     Assessment/Plan    PT Assessment Patient needs continued PT services  PT Problem List Decreased strength;Decreased range of motion;Decreased activity tolerance;Decreased balance;Decreased mobility;Decreased coordination;Decreased cognition;Decreased knowledge of use of DME;Decreased safety awareness          PT Treatment Interventions DME instruction;Gait training;Stair training;Functional mobility training;Therapeutic  activities;Therapeutic exercise;Balance training;Patient/family education    PT Goals (Current goals can be found in the Care Plan section)  Acute Rehab PT Goals Patient Stated Goal: REALLY wanting to get home PT Goal Formulation: With patient Time For Goal Achievement: 11/10/16 Potential to Achieve Goals: Good    Frequency Min 3X/week   Barriers to discharge        Co-evaluation               End of Session Equipment Utilized During Treatment: Gait belt Activity Tolerance: Patient tolerated treatment well Patient left: in chair;with call bell/phone within reach;with family/visitor present;with chair alarm set Nurse Communication: Mobility status         Time: 0825-0858 PT Time Calculation (min) (ACUTE ONLY): 33 min   Charges:   PT Evaluation $PT Eval Moderate Complexity: 1 Procedure PT Treatments $Gait Training: 8-22 mins   PT G CodesColletta Holt 10/27/2016, 9:14 AM  Gerald Holt, PT  Acute Rehabilitation Services Pager [phone] Office [phone]

## 2016-10-27 NOTE — Progress Notes (Signed)
Discharged via wheelchair,all belongings gathered. Foley cath d/c'd prior to discharge, to be I/O cath'd at home as prior to admission.D/C instructions discussed and no questions at present. IV's d/c'd.

## 2016-10-28 ENCOUNTER — Telehealth: Payer: Self-pay

## 2016-10-28 NOTE — Telephone Encounter (Signed)
LM (home and cell) requesting call back to complete TCM and schedule hospital follow up.

## 2016-10-29 DIAGNOSIS — I480 Paroxysmal atrial fibrillation: Secondary | ICD-10-CM | POA: Diagnosis not present

## 2016-10-29 DIAGNOSIS — Z466 Encounter for fitting and adjustment of urinary device: Secondary | ICD-10-CM | POA: Diagnosis not present

## 2016-10-29 DIAGNOSIS — I69398 Other sequelae of cerebral infarction: Secondary | ICD-10-CM | POA: Diagnosis not present

## 2016-10-29 DIAGNOSIS — F0390 Unspecified dementia without behavioral disturbance: Secondary | ICD-10-CM | POA: Diagnosis not present

## 2016-10-29 DIAGNOSIS — N189 Chronic kidney disease, unspecified: Secondary | ICD-10-CM | POA: Diagnosis not present

## 2016-10-29 DIAGNOSIS — N390 Urinary tract infection, site not specified: Secondary | ICD-10-CM | POA: Diagnosis not present

## 2016-10-29 DIAGNOSIS — R2689 Other abnormalities of gait and mobility: Secondary | ICD-10-CM | POA: Diagnosis not present

## 2016-10-29 DIAGNOSIS — Z9181 History of falling: Secondary | ICD-10-CM | POA: Diagnosis not present

## 2016-10-29 DIAGNOSIS — E1165 Type 2 diabetes mellitus with hyperglycemia: Secondary | ICD-10-CM | POA: Diagnosis not present

## 2016-10-29 DIAGNOSIS — E1122 Type 2 diabetes mellitus with diabetic chronic kidney disease: Secondary | ICD-10-CM | POA: Diagnosis not present

## 2016-10-29 DIAGNOSIS — N39490 Overflow incontinence: Secondary | ICD-10-CM | POA: Diagnosis not present

## 2016-10-29 DIAGNOSIS — I69354 Hemiplegia and hemiparesis following cerebral infarction affecting left non-dominant side: Secondary | ICD-10-CM | POA: Diagnosis not present

## 2016-10-29 DIAGNOSIS — E1136 Type 2 diabetes mellitus with diabetic cataract: Secondary | ICD-10-CM | POA: Diagnosis not present

## 2016-10-29 LAB — CULTURE, BLOOD (ROUTINE X 2)
CULTURE: NO GROWTH
CULTURE: NO GROWTH

## 2016-10-29 NOTE — Telephone Encounter (Signed)
At this point I would not restart the Valtrex and have him follow-up with [name] as scheduled.  If there are any new or worsening symptoms, they need to come see [name] ASAP or go to the ER for reassessment, especially if there is any issue cathing.

## 2016-10-29 NOTE — Telephone Encounter (Signed)
Spoke with patient's caregiver and recommendations given regarding Valtrex. Patient hydrating well. Afebrile. Taking medications as directed.  Some loose stool. Will start probiotic. She is to call in the morning or Mychart with his output for tonight and tomorrow morning. She is cathing without issue at present.

## 2016-10-29 NOTE — Telephone Encounter (Addendum)
Transition Care Management Follow-up Telephone Call  Per Discharge Summary: PCP: Leeanne Rio, PA-C  Admit date: 10/24/2016 Discharge date: 10/27/2016  Time spent: > 35 minutes  Recommendations for Outpatient Follow-up:  1. Patient will be discharged on 4 more days of antibiotics to complete a seven-day treatment course   Discharge Diagnoses:  Principal Problem:   Sepsis secondary to UTI [facility]) Active Problems:   Diabetes mellitus type II, uncontrolled ([place])   Paroxysmal atrial fibrillation ([place])   Hypothyroidism   Renal insufficiency   Discharge Condition: stable  Diet recommendation: carb modified diet  --  Call completed w/ pt's wife, Gerald Holt.   How have you been since you were released from the hospital? "We got home Sunday night and he just wanted coffee and he went to bed at 7:30pm. He was a little shaky on his feet. Yesterday he didn't feel like doing anything besides watching TV, which was fine. We tried to get him up to walk a little bit and he was a little less shaky, but he's stubborn about using the walker. PT is coming today to hopefully start some PT."   Do you understand why you were in the hospital? yes   Do you understand the discharge instructions? yes   Where were you discharged to? Home   Items Reviewed:  Medications reviewed: yes  Allergies reviewed: yes  Dietary changes reviewed: yes  Referrals reviewed: yes, home health PT   Functional Questionnaire:   Activities of Daily Living (ADLs):   He states they are independent in the following: feeding, continence and grooming States they require assistance with the following: ambulation, bathing and hygiene, toileting and dressing   Any transportation issues/concerns?: no   Any patient concerns? Yes, pt's wife is concerned about pt's decreased appetite. Reassured her that it is normal to experience decreased appetite after illness, and that his appetite should gradually  return. She is also concerned that he is experiencing decreased UOP and concentrated urine. States total UOP was about 1200cc yesterday, whereas pre-admit baseline was about twice that. Denies any urine odor or cloudy urine. Advised her to continue oral hydration therapy and f/u w/ PCP if any S/S of returning infection. Labs reviewed, BUN/creat and electrolytes normal on discharge. Also states CBGs have been varying "a lot," ranging from 140s-180s, advised that variation is normal and this range is reasonable given age and recent acute illness. Lastly, wife states pt had 2.5 days left of 10 day course of valacyclovir on admit. He did not complete course while inpatient. She would like to know if she should continue to give him the last few doses.    Confirmed importance and date/time of follow-up visits scheduled yes  Provider Appointment booked with Gerald Aquas, PA-C 11/01/16 @ 11:30am  Confirmed with patient if condition begins to worsen call PCP or go to the ER.  Patient was given the office number and encouraged to call back with question or concerns.  : yes

## 2016-10-30 ENCOUNTER — Encounter: Payer: Self-pay | Admitting: Physician Assistant

## 2016-10-30 ENCOUNTER — Telehealth: Payer: Self-pay | Admitting: Physician Assistant

## 2016-10-30 ENCOUNTER — Encounter (HOSPITAL_BASED_OUTPATIENT_CLINIC_OR_DEPARTMENT_OTHER): Payer: Self-pay

## 2016-10-30 ENCOUNTER — Inpatient Hospital Stay (HOSPITAL_BASED_OUTPATIENT_CLINIC_OR_DEPARTMENT_OTHER)
Admission: EM | Admit: 2016-10-30 | Discharge: 2016-11-03 | DRG: 195 | Disposition: A | Discharge status: Home Health | Payer: Medicare Other | Attending: Internal Medicine | Admitting: Internal Medicine

## 2016-10-30 DIAGNOSIS — Z888 Allergy status to other drugs, medicaments and biological substances status: Secondary | ICD-10-CM

## 2016-10-30 DIAGNOSIS — E785 Hyperlipidemia, unspecified: Secondary | ICD-10-CM | POA: Diagnosis present

## 2016-10-30 DIAGNOSIS — L309 Dermatitis, unspecified: Secondary | ICD-10-CM | POA: Diagnosis present

## 2016-10-30 DIAGNOSIS — Z79899 Other long term (current) drug therapy: Secondary | ICD-10-CM

## 2016-10-30 DIAGNOSIS — Z91013 Allergy to seafood: Secondary | ICD-10-CM

## 2016-10-30 DIAGNOSIS — J189 Pneumonia, unspecified organism: Secondary | ICD-10-CM | POA: Diagnosis not present

## 2016-10-30 DIAGNOSIS — I480 Paroxysmal atrial fibrillation: Secondary | ICD-10-CM | POA: Diagnosis present

## 2016-10-30 DIAGNOSIS — Y95 Nosocomial condition: Secondary | ICD-10-CM | POA: Diagnosis present

## 2016-10-30 DIAGNOSIS — E1165 Type 2 diabetes mellitus with hyperglycemia: Secondary | ICD-10-CM | POA: Diagnosis present

## 2016-10-30 DIAGNOSIS — Z8673 Personal history of transient ischemic attack (TIA), and cerebral infarction without residual deficits: Secondary | ICD-10-CM

## 2016-10-30 DIAGNOSIS — Z8619 Personal history of other infectious and parasitic diseases: Secondary | ICD-10-CM

## 2016-10-30 DIAGNOSIS — Z87440 Personal history of urinary (tract) infections: Secondary | ICD-10-CM

## 2016-10-30 DIAGNOSIS — W19XXXA Unspecified fall, initial encounter: Secondary | ICD-10-CM

## 2016-10-30 DIAGNOSIS — Z87891 Personal history of nicotine dependence: Secondary | ICD-10-CM

## 2016-10-30 DIAGNOSIS — H9190 Unspecified hearing loss, unspecified ear: Secondary | ICD-10-CM | POA: Diagnosis present

## 2016-10-30 DIAGNOSIS — J181 Lobar pneumonia, unspecified organism: Principal | ICD-10-CM

## 2016-10-30 DIAGNOSIS — Z833 Family history of diabetes mellitus: Secondary | ICD-10-CM

## 2016-10-30 DIAGNOSIS — IMO0002 Reserved for concepts with insufficient information to code with codable children: Secondary | ICD-10-CM | POA: Diagnosis present

## 2016-10-30 DIAGNOSIS — I10 Essential (primary) hypertension: Secondary | ICD-10-CM | POA: Diagnosis not present

## 2016-10-30 DIAGNOSIS — Z7982 Long term (current) use of aspirin: Secondary | ICD-10-CM

## 2016-10-30 DIAGNOSIS — Z66 Do not resuscitate: Secondary | ICD-10-CM | POA: Diagnosis present

## 2016-10-30 DIAGNOSIS — E11319 Type 2 diabetes mellitus with unspecified diabetic retinopathy without macular edema: Secondary | ICD-10-CM | POA: Diagnosis present

## 2016-10-30 DIAGNOSIS — K219 Gastro-esophageal reflux disease without esophagitis: Secondary | ICD-10-CM | POA: Diagnosis present

## 2016-10-30 DIAGNOSIS — E039 Hypothyroidism, unspecified: Secondary | ICD-10-CM | POA: Diagnosis present

## 2016-10-30 DIAGNOSIS — Z7984 Long term (current) use of oral hypoglycemic drugs: Secondary | ICD-10-CM

## 2016-10-30 DIAGNOSIS — E118 Type 2 diabetes mellitus with unspecified complications: Secondary | ICD-10-CM

## 2016-10-30 DIAGNOSIS — R339 Retention of urine, unspecified: Secondary | ICD-10-CM | POA: Diagnosis present

## 2016-10-30 DIAGNOSIS — R918 Other nonspecific abnormal finding of lung field: Secondary | ICD-10-CM | POA: Diagnosis not present

## 2016-10-30 DIAGNOSIS — Z886 Allergy status to analgesic agent status: Secondary | ICD-10-CM

## 2016-10-30 NOTE — ED Triage Notes (Signed)
Pt was just discharged from the hospital Sunday for pneumonia.  Wife states he was "hot and weak" tonight and she checked his temperature at home and it was 100.  No tylenol given prior to arrival.

## 2016-10-30 NOTE — Telephone Encounter (Signed)
Requesting verbal order for physical therapy.  Please return call to Deirdra at [phone].

## 2016-10-30 NOTE — Telephone Encounter (Signed)
Spoke with Deidra at [facility] and gave verbal orders for physical therapy.

## 2016-10-31 ENCOUNTER — Encounter: Payer: Self-pay | Admitting: Physician Assistant

## 2016-10-31 ENCOUNTER — Telehealth: Payer: Self-pay | Admitting: Physician Assistant

## 2016-10-31 ENCOUNTER — Emergency Department (HOSPITAL_BASED_OUTPATIENT_CLINIC_OR_DEPARTMENT_OTHER): Payer: Medicare Other

## 2016-10-31 ENCOUNTER — Encounter (HOSPITAL_BASED_OUTPATIENT_CLINIC_OR_DEPARTMENT_OTHER): Payer: Self-pay | Admitting: Emergency Medicine

## 2016-10-31 DIAGNOSIS — E118 Type 2 diabetes mellitus with unspecified complications: Secondary | ICD-10-CM

## 2016-10-31 DIAGNOSIS — I480 Paroxysmal atrial fibrillation: Secondary | ICD-10-CM

## 2016-10-31 DIAGNOSIS — J189 Pneumonia, unspecified organism: Secondary | ICD-10-CM | POA: Diagnosis not present

## 2016-10-31 DIAGNOSIS — E1165 Type 2 diabetes mellitus with hyperglycemia: Secondary | ICD-10-CM | POA: Diagnosis not present

## 2016-10-31 DIAGNOSIS — Z7982 Long term (current) use of aspirin: Secondary | ICD-10-CM | POA: Diagnosis not present

## 2016-10-31 DIAGNOSIS — Y95 Nosocomial condition: Secondary | ICD-10-CM | POA: Diagnosis present

## 2016-10-31 DIAGNOSIS — E784 Other hyperlipidemia: Secondary | ICD-10-CM | POA: Diagnosis not present

## 2016-10-31 DIAGNOSIS — R339 Retention of urine, unspecified: Secondary | ICD-10-CM | POA: Diagnosis present

## 2016-10-31 DIAGNOSIS — L309 Dermatitis, unspecified: Secondary | ICD-10-CM | POA: Diagnosis present

## 2016-10-31 DIAGNOSIS — Z87891 Personal history of nicotine dependence: Secondary | ICD-10-CM | POA: Diagnosis not present

## 2016-10-31 DIAGNOSIS — R918 Other nonspecific abnormal finding of lung field: Secondary | ICD-10-CM | POA: Diagnosis not present

## 2016-10-31 DIAGNOSIS — E785 Hyperlipidemia, unspecified: Secondary | ICD-10-CM | POA: Diagnosis present

## 2016-10-31 DIAGNOSIS — Z888 Allergy status to other drugs, medicaments and biological substances status: Secondary | ICD-10-CM | POA: Diagnosis not present

## 2016-10-31 DIAGNOSIS — K219 Gastro-esophageal reflux disease without esophagitis: Secondary | ICD-10-CM | POA: Diagnosis not present

## 2016-10-31 DIAGNOSIS — E11319 Type 2 diabetes mellitus with unspecified diabetic retinopathy without macular edema: Secondary | ICD-10-CM | POA: Diagnosis present

## 2016-10-31 DIAGNOSIS — E039 Hypothyroidism, unspecified: Secondary | ICD-10-CM | POA: Diagnosis not present

## 2016-10-31 DIAGNOSIS — Z8619 Personal history of other infectious and parasitic diseases: Secondary | ICD-10-CM | POA: Diagnosis not present

## 2016-10-31 DIAGNOSIS — Z833 Family history of diabetes mellitus: Secondary | ICD-10-CM | POA: Diagnosis not present

## 2016-10-31 DIAGNOSIS — Z886 Allergy status to analgesic agent status: Secondary | ICD-10-CM | POA: Diagnosis not present

## 2016-10-31 DIAGNOSIS — Z87440 Personal history of urinary (tract) infections: Secondary | ICD-10-CM | POA: Diagnosis not present

## 2016-10-31 DIAGNOSIS — I10 Essential (primary) hypertension: Secondary | ICD-10-CM | POA: Diagnosis present

## 2016-10-31 DIAGNOSIS — Z7984 Long term (current) use of oral hypoglycemic drugs: Secondary | ICD-10-CM | POA: Diagnosis not present

## 2016-10-31 DIAGNOSIS — J181 Lobar pneumonia, unspecified organism: Secondary | ICD-10-CM | POA: Diagnosis not present

## 2016-10-31 DIAGNOSIS — Z79899 Other long term (current) drug therapy: Secondary | ICD-10-CM | POA: Diagnosis not present

## 2016-10-31 DIAGNOSIS — Z8673 Personal history of transient ischemic attack (TIA), and cerebral infarction without residual deficits: Secondary | ICD-10-CM | POA: Diagnosis not present

## 2016-10-31 DIAGNOSIS — Z66 Do not resuscitate: Secondary | ICD-10-CM | POA: Diagnosis present

## 2016-10-31 DIAGNOSIS — Z91013 Allergy to seafood: Secondary | ICD-10-CM | POA: Diagnosis not present

## 2016-10-31 DIAGNOSIS — H9190 Unspecified hearing loss, unspecified ear: Secondary | ICD-10-CM | POA: Diagnosis present

## 2016-10-31 LAB — CBC WITH DIFFERENTIAL/PLATELET
BASOS PCT: 1 %
Basophils Absolute: 0.1 10*3/uL (ref 0.0–0.1)
Eosinophils Absolute: 1.4 10*3/uL — ABNORMAL HIGH (ref 0.0–0.7)
Eosinophils Relative: 13 %
HEMATOCRIT: 33.4 % — AB (ref 39.0–52.0)
HEMOGLOBIN: 11.3 g/dL — AB (ref 13.0–17.0)
LYMPHS ABS: 2 10*3/uL (ref 0.7–4.0)
LYMPHS PCT: 20 %
MCH: 30.5 pg (ref 26.0–34.0)
MCHC: 33.8 g/dL (ref 30.0–36.0)
MCV: 90 fL (ref 78.0–100.0)
MONO ABS: 1.3 10*3/uL — AB (ref 0.1–1.0)
Monocytes Relative: 13 %
NEUTROS ABS: 5.5 10*3/uL (ref 1.7–7.7)
Neutrophils Relative %: 53 %
Platelets: 261 10*3/uL (ref 150–400)
RBC: 3.71 MIL/uL — ABNORMAL LOW (ref 4.22–5.81)
RDW: 15.4 % (ref 11.5–15.5)
WBC: 10.2 10*3/uL (ref 4.0–10.5)

## 2016-10-31 LAB — URINALYSIS, ROUTINE W REFLEX MICROSCOPIC
Bilirubin Urine: NEGATIVE
GLUCOSE, UA: NEGATIVE mg/dL
Hgb urine dipstick: NEGATIVE
Ketones, ur: NEGATIVE mg/dL
NITRITE: NEGATIVE
PH: 6.5 (ref 5.0–8.0)
Protein, ur: NEGATIVE mg/dL
SPECIFIC GRAVITY, URINE: 1.013 (ref 1.005–1.030)

## 2016-10-31 LAB — BASIC METABOLIC PANEL
Anion gap: 9 (ref 5–15)
BUN: 17 mg/dL (ref 6–20)
CHLORIDE: 101 mmol/L (ref 101–111)
CO2: 23 mmol/L (ref 22–32)
Calcium: 9.3 mg/dL (ref 8.9–10.3)
Creatinine, Ser: 1.01 mg/dL (ref 0.61–1.24)
GFR calc Af Amer: >60 mL/min (ref >60)
GFR calc non Af Amer: >60 mL/min (ref >60)
GLUCOSE: 155 mg/dL — AB (ref 65–99)
Potassium: 4.2 mmol/L (ref 3.5–5.1)
Sodium: 133 mmol/L — ABNORMAL LOW (ref 135–145)

## 2016-10-31 LAB — GLUCOSE, CAPILLARY
Glucose-Capillary: 146 mg/dL — ABNORMAL HIGH (ref 65–99)
Glucose-Capillary: 135 mg/dL — ABNORMAL HIGH (ref 65–99)

## 2016-10-31 LAB — URINALYSIS, MICROSCOPIC (REFLEX): RBC / HPF: NONE SEEN RBC/hpf (ref 0–5)

## 2016-10-31 LAB — STREP PNEUMONIAE URINARY ANTIGEN: Strep Pneumo Urinary Antigen: NEGATIVE

## 2016-10-31 MED ORDER — DOCUSATE SODIUM 100 MG PO CAPS: 100.0000 mg | ORAL_CAPSULE | ORAL | Status: DC

## 2016-10-31 MED ORDER — ACETAMINOPHEN 650 MG RE SUPP: 650.0000 mg | Freq: Four times a day (QID) | RECTAL | Status: DC | PRN

## 2016-10-31 MED ORDER — BISACODYL 10 MG RE SUPP: 10.0000 mg | Freq: Every day | RECTAL | Status: DC | PRN

## 2016-10-31 MED ORDER — DOCUSATE SODIUM 100 MG PO CAPS
100.0000 mg | ORAL_CAPSULE | Freq: Two times a day (BID) | ORAL | Status: DC
  Administered 2016-10-31 – 2016-11-03 (×6): 100 mg via ORAL
  Filled 2016-10-31 (×7): qty 1

## 2016-10-31 MED ORDER — HYDROCODONE-ACETAMINOPHEN 5-325 MG PO TABS: 1.0000 | ORAL_TABLET | ORAL | Status: DC | PRN

## 2016-10-31 MED ORDER — PIPERACILLIN-TAZOBACTAM 3.375 G IVPB 30 MIN
3.3750 g | Freq: Once | INTRAVENOUS | Status: AC
  Administered 2016-10-31: 3.375 g via INTRAVENOUS
  Filled 2016-10-31 (×2): qty 50

## 2016-10-31 MED ORDER — LEVOTHYROXINE SODIUM 50 MCG PO TABS
50.0000 ug | ORAL_TABLET | Freq: Every day | ORAL | Status: DC
  Administered 2016-10-31 – 2016-11-03 (×4): 50 ug via ORAL
  Filled 2016-10-31 (×4): qty 1

## 2016-10-31 MED ORDER — ENOXAPARIN SODIUM 40 MG/0.4ML ~~LOC~~ SOLN
40.0000 mg | Freq: Every day | SUBCUTANEOUS | Status: DC
  Administered 2016-10-31 – 2016-11-03 (×4): 40 mg via SUBCUTANEOUS
  Filled 2016-10-31 (×4): qty 0.4

## 2016-10-31 MED ORDER — INSULIN ASPART 100 UNIT/ML ~~LOC~~ SOLN
0.0000 [IU] | Freq: Three times a day (TID) | SUBCUTANEOUS | Status: DC
  Administered 2016-10-31: 1 [IU] via SUBCUTANEOUS
  Administered 2016-10-31 – 2016-11-02 (×6): 2 [IU] via SUBCUTANEOUS
  Administered 2016-11-02: 1 [IU] via SUBCUTANEOUS
  Administered 2016-11-03 (×2): 2 [IU] via SUBCUTANEOUS

## 2016-10-31 MED ORDER — LISINOPRIL 5 MG PO TABS
2.5000 mg | ORAL_TABLET | Freq: Every day | ORAL | Status: DC
  Administered 2016-10-31 – 2016-11-01 (×2): 2.5 mg via ORAL
  Filled 2016-10-31 (×2): qty 1

## 2016-10-31 MED ORDER — ALBUTEROL SULFATE (2.5 MG/3ML) 0.083% IN NEBU: 2.5000 mg | INHALATION_SOLUTION | RESPIRATORY_TRACT | Status: DC | PRN

## 2016-10-31 MED ORDER — ALBUTEROL SULFATE (2.5 MG/3ML) 0.083% IN NEBU
2.5000 mg | INHALATION_SOLUTION | Freq: Four times a day (QID) | RESPIRATORY_TRACT | Status: DC
  Administered 2016-10-31: 2.5 mg via RESPIRATORY_TRACT
  Filled 2016-10-31: qty 3

## 2016-10-31 MED ORDER — SODIUM CHLORIDE 0.9 % IV SOLN
INTRAVENOUS | Status: AC
  Administered 2016-10-31: 13:00:00 via INTRAVENOUS

## 2016-10-31 MED ORDER — ACETAMINOPHEN 325 MG PO TABS: 650.0000 mg | ORAL_TABLET | Freq: Four times a day (QID) | ORAL | Status: DC | PRN

## 2016-10-31 MED ORDER — DEXTROSE 5 % IV SOLN
1.0000 g | INTRAVENOUS | Status: DC
  Administered 2016-10-31 – 2016-11-02 (×3): 1 g via INTRAVENOUS
  Filled 2016-10-31 (×4): qty 1

## 2016-10-31 MED ORDER — VANCOMYCIN HCL 500 MG IV SOLR
500.0000 mg | Freq: Two times a day (BID) | INTRAVENOUS | Status: DC
  Administered 2016-10-31 – 2016-11-02 (×6): 500 mg via INTRAVENOUS
  Filled 2016-10-31 (×7): qty 500

## 2016-10-31 MED ORDER — ONDANSETRON HCL 4 MG/2ML IJ SOLN: 4.0000 mg | Freq: Four times a day (QID) | INTRAMUSCULAR | Status: DC | PRN

## 2016-10-31 MED ORDER — ASPIRIN EC 81 MG PO TBEC
81.0000 mg | DELAYED_RELEASE_TABLET | Freq: Every day | ORAL | Status: DC
  Administered 2016-10-31 – 2016-11-03 (×4): 81 mg via ORAL
  Filled 2016-10-31 (×4): qty 1

## 2016-10-31 MED ORDER — TRAZODONE HCL 50 MG PO TABS: 25.0000 mg | ORAL_TABLET | Freq: Every evening | ORAL | Status: DC | PRN

## 2016-10-31 MED ORDER — ONDANSETRON HCL 4 MG PO TABS: 4.0000 mg | ORAL_TABLET | Freq: Four times a day (QID) | ORAL | Status: DC | PRN

## 2016-10-31 MED ORDER — PRAVASTATIN SODIUM 20 MG PO TABS
10.0000 mg | ORAL_TABLET | Freq: Every day | ORAL | Status: DC
  Administered 2016-10-31 – 2016-11-02 (×3): 10 mg via ORAL
  Filled 2016-10-31 (×3): qty 1

## 2016-10-31 MED ORDER — INSULIN ASPART 100 UNIT/ML ~~LOC~~ SOLN: 0.0000 [IU] | Freq: Every day | SUBCUTANEOUS | Status: DC

## 2016-10-31 MED ORDER — VANCOMYCIN HCL IN DEXTROSE 1-5 GM/200ML-% IV SOLN
1000.0000 mg | Freq: Once | INTRAVENOUS | Status: AC
  Administered 2016-10-31: 1000 mg via INTRAVENOUS
  Filled 2016-10-31: qty 200

## 2016-10-31 NOTE — Progress Notes (Signed)
This is a no charge note   Transfer from [facility] per dr. Randal Buba  83 year old male with a past medical history of a hypertension, diabetes mellitus, hypothyroidism, stroke, atrial fibrillation not on anticoagulants, who presents with fever, generalized weakness. Patient was recently hospitalized from 2/1-2/4 due to sepsis secondary to UTI. Patient was discharged on ciprofloxacin. Chest x-ray showed possible left base linear Infiltration for HCAP. Pt is started with vanco and zoysn in ED. Pt is accepted to tele as inpt.  Ivor Costa, MD  [facility] Pager [phone]  If 7PM-7AM, please contact night-coverage [email or web address] Password [email or web address] 10/31/2016, 1:39 AM

## 2016-10-31 NOTE — Care Management Note (Addendum)
Case Management Note  Patient Details  Name: Gerald Holt MRN: [id number] Date of Birth: 01/07/1924  Subjective/Objective:                 Patient is a direct admit from [facility], concern for PNA. Patient from home, has had increased in weakness.   Action/Plan:  CM will continue to follow for DC needs. Patient active with [facility], will need resumption order if DC to home, PT eval pending.  Expected Discharge Date:                  Expected Discharge Plan:  Home/Self Care  In-House Referral:     Discharge planning Services  CM Consult  Post Acute Care Choice:    Choice offered to:     DME Arranged:    DME Agency:     HH Arranged:    HH Agency:     Status of Service:  In process, will continue to follow  If discussed at Long Length of Stay Meetings, dates discussed:    Additional Comments:  Carles Collet, RN 10/31/2016, 4:52 PM

## 2016-10-31 NOTE — Telephone Encounter (Signed)
Deidre from [place] called to let you know that patient was admitted to [facility] yesterday and will miss PT today.   Thank you.

## 2016-10-31 NOTE — ED Notes (Signed)
[facility] here to transport pt to [facility]

## 2016-10-31 NOTE — Progress Notes (Signed)
Pharmacy Antibiotic Note  Gerald Holt is a 83 y.o. male admitted on 10/30/2016 with pneumonia.  Pharmacy has been consulted for vancomycin dosing. Pt received 1 gram dose of IV vanc in the ED. SCr looks to be around baseline. Considering age will plan for a vancomycin trough at steady state.   Plan: Vancomycin 500 IV every 12 hours.  Goal trough 15-20 mcg/mL. Monitor clinical progress, c/s, renal function, abx plan/LOT VT@SS  as indicated   Height: 5\' 6"  (167.6 cm) Weight: 153 lb (69.4 kg) IBW/kg (Calculated) : 63.8  Temp (24hrs), Avg:98.8 F (37.1 C), Min:97.6 F (36.4 C), Max:100.4 F (38 C)   Recent Labs Lab 10/24/16 1708 10/24/16 1723 10/24/16 2019 10/25/16 0109 10/25/16 0356 10/27/16 0442 10/31/16 0037  WBC 12.1*  --   --  9.3  --  6.9 10.2  CREATININE 1.19  --   --  1.07  --  1.17 1.01  LATICACIDVEN  --  4.38* 2.39* 1.2 1.0  --   --     Estimated Creatinine Clearance: 43.9 mL/min (by C-G formula based on SCr of 1.01 mg/dL).    Allergies  Allergen Reactions  . Flomax [Tamsulosin Hcl] Other (See Comments)    Hypotension  . Shellfish Allergy Anaphylaxis  . Aspirin-Dipyridamole Er Other (See Comments)    Headaches   . Other Other (See Comments)    Blood Thinner given in Rehab gave H/As - Not Coumadin/Headaches      Antimicrobials this admission: 2/8 vanc >>  2/8 Zosyn x 1  Dose adjustments this admission:  Microbiology results: 2/8 BCx: pending 2/8 Sputum: sent    Thank you for allowing [name] to participate in this patients care.  Jens Som, PharmD Clinical phone for 10/31/2016 from 7a-3:30p: [phone] If after 3:30p, please call main pharmacy at: [phone] 10/31/2016 11:26 AM

## 2016-10-31 NOTE — ED Notes (Signed)
ED Provider at bedside. 

## 2016-10-31 NOTE — ED Provider Notes (Signed)
[place] DEPT [facility] Provider Note   CSN: [id number] Arrival date & time: 10/30/16  2323     History   Chief Complaint Chief Complaint  Patient presents with  . Fever    HPI Gerald Holt is a 83 y.o. male.  The history is provided by the patient and the spouse.  Fever   This is a recurrent problem. The current episode started 1 to 2 hours ago. The problem occurs constantly. The problem has been resolved. The maximum temperature noted was 100 to 100.9 F (100). The temperature was taken using an oral thermometer. Pertinent negatives include no chest pain, no fussiness, no sleepiness, no diarrhea, no vomiting, no congestion, no headaches, no sore throat, no tugging at ear, no muscle aches and no cough. He has tried nothing for the symptoms. The treatment provided significant relief.    Past Medical History:  Diagnosis Date  . A-fib ([place])   . Cataracts, bilateral   . Chronic dermatitis   . Diabetes type 2, controlled ([place]) 2000  . History of chicken pox   . Mumps    Adult  . Retinopathy   . Shingles   . Stroke [facility]) Nov 2011  . Undescended testicle, unilateral    Right  . Whooping cough     Patient Active Problem List   Diagnosis Date Noted  . Sepsis secondary to UTI ([place]) 10/25/2016  . Renal insufficiency 10/25/2016  . Sepsis ([place]) 10/24/2016  . Encounter for therapeutic drug monitoring 10/20/2016  . Hx: UTI (urinary tract infection) 10/20/2016  . Hypothyroidism 09/15/2016  . Gastroesophageal reflux disease without esophagitis 07/09/2015  . Falls 06/22/2015  . Diabetes mellitus type II, uncontrolled ([place]) 05/29/2015  . Paroxysmal atrial fibrillation ([place]) 05/29/2015  . Hyperlipidemia 05/29/2015  . Hx of completed stroke 05/29/2015    Past Surgical History:  Procedure Laterality Date  . PRE-MALIGNANT / BENIGN SKIN LESION EXCISION    . TESTICLE SURGERY     Right, undescended  . TOOTH EXTRACTION         Home Medications    Prior to Admission  medications   Medication Sig Start Date End Date Taking? Authorizing Provider  aspirin 81 MG tablet Take 1 tablet (81 mg total) by mouth daily. 06/22/15   Jerline Pain, MD  Calcium Carbonate Antacid (ALKA-SELTZER ANTACID PO) Take 2 tablets by mouth as needed (for indegestion).     Historical Provider, MD  ciprofloxacin (CIPRO) 500 MG tablet Take 1 tablet (500 mg total) by mouth 2 (two) times daily. 10/27/16 10/31/16  Velvet Bathe, MD  cyanocobalamin (,VITAMIN B-12,) 1000 MCG/ML injection Inject 1000 mcg intramuscular daily for 7 days, then once a week for 4 weeks, then once a month for a year Patient taking differently: Inject 1,000 mcg into the muscle every 30 (thirty) days.  04/02/16   Pieter Partridge, DO  docusate sodium (COLACE) 100 MG capsule Take 100-200 mg by mouth See admin instructions. Take 100 mg by mouth daily for 2 days, then on every third day take 200 mg by mouth daily. Alternate 100 mg and 200 mg    Historical Provider, MD  Eyelid Cleansers (STERILID EX) Apply 1 application topically See admin instructions. Eye Wash: Once Daily     Historical Provider, MD  folic acid (FOLVITE) 1 MG tablet Take 1 mg by mouth at bedtime.     Historical Provider, MD  glipiZIDE (GLUCOTROL XL) 2.5 MG 24 hr tablet Take 1 tablet (2.5 mg total) by mouth daily with breakfast. 10/17/16  [name], PA-C  glucose blood test strip Use one strip daily to check blood sugars for diabetes 10/17/16   [name], PA-C  levothyroxine (SYNTHROID, LEVOTHROID) 50 MCG tablet Take 1 tablet (50 mcg total) by mouth daily. 04/09/16   [name], PA-C  lisinopril (PRINIVIL,ZESTRIL) 2.5 MG tablet Take 1 tablet (2.5 mg total) by mouth daily. 04/09/16   [name], PA-C  lovastatin (MEVACOR) 10 MG tablet Take 5 mg by mouth at bedtime.  08/21/16   Historical Provider, MD  metFORMIN (GLUCOPHAGE-XR) 500 MG 24 hr tablet Take 2 tablets with breakfast and at bedtime Patient taking differently: Take 1,000 mg by mouth 2 (two)  times daily.  08/21/16   [name], PA-C  metoprolol tartrate (LOPRESSOR) 25 MG tablet Take 0.5 tablets (12.5 mg total) by mouth 2 (two) times daily. 04/09/16   [name], PA-C  Miconazole Nitrate (TRIPLE PASTE AF) 2 % OINT Apply 1 application topically once a week.     Historical Provider, MD  NEEDLE, DISP, 25 G 25G X 1-1/2" MISC 1 each by Does not apply route as directed. Use to draw and inject B12 04/02/16   Pieter Partridge, DO  Omega-3 Fatty Acids (SB OMEGA-3 FISH OIL) 1000 MG CAPS Take 1,000 mg by mouth 2 (two) times daily. Reported on 10/06/2015    Historical Provider, MD  OVER THE COUNTER MEDICATION Take 1 capsule by mouth 2 (two) times daily. OTC Focus Select    Historical Provider, MD  Syringe, Disposable, 3 ML MISC 1 each by Does not apply route as directed. 04/02/16   Pieter Partridge, DO  triamcinolone cream (KENALOG) 0.1 % Apply 1 application topically as needed (for itching).  02/01/16   Historical Provider, MD  valACYclovir (VALTREX) 1000 MG tablet Take 1,000 mg by mouth 2 (two) times daily. For 10 days 10/16/16   Historical Provider, MD    Family History Family History  Problem Relation Age of Onset  . Pneumonia Mother 41    Deceased  . GI Bleed Father 28    Deceased - Ulcers  . Diabetes Cousin   . Diabetes Brother   . Cancer Paternal Aunt     Social History Social History  Substance Use Topics  . Smoking status: Former Research scientist (life sciences)  . Smokeless tobacco: Never Used     Comment: Hasn't smoked since age 53  . Alcohol use No     Allergies   Flomax [tamsulosin hcl]; Shellfish allergy; Aspirin-dipyridamole er; and Other   Review of Systems Review of Systems  Constitutional: Positive for fever. Negative for unexpected weight change.  HENT: Negative for congestion, ear discharge, facial swelling and sore throat.   Respiratory: Negative for cough.   Cardiovascular: Negative for chest pain.  Gastrointestinal: Negative for diarrhea and vomiting.  Neurological: Negative  for headaches.  All other systems reviewed and are negative.    Physical Exam Updated Vital Signs BP 150/58 (BP Location: Right Arm)   Pulse 72   Temp 98 F (36.7 C) (Oral)   Resp 20   Ht 5\' 6"  (1.676 m)   Wt 153 lb (69.4 kg)   SpO2 99%   BMI 24.69 kg/m   Physical Exam  Constitutional: He is oriented to person, place, and time. He appears well-developed and well-nourished. No distress.  HENT:  Head: Normocephalic and atraumatic.  Nose: Nose normal.  Mouth/Throat: No oropharyngeal exudate.  Eyes: Conjunctivae and EOM are normal. Pupils are equal, round, and reactive to light.  Neck: Normal range of motion. Neck supple.  Cardiovascular: Normal rate, regular rhythm and intact distal pulses.   Pulmonary/Chest: Effort normal and breath sounds normal. No respiratory distress. He has no wheezes. He has no rales.  Abdominal: Soft. Bowel sounds are normal. He exhibits no mass. There is no tenderness. There is no rebound and no guarding.  Musculoskeletal: Normal range of motion. He exhibits no edema or deformity.  Lymphadenopathy:    He has no cervical adenopathy.  Neurological: He is alert and oriented to person, place, and time.  Skin: Skin is warm and dry. Capillary refill takes less than 2 seconds. No rash noted.  Psychiatric: He has a normal mood and affect.     ED Treatments / Results   Vitals:   10/30/16 2330  BP: 150/58  Pulse: 72  Resp: 20  Temp: 98 F (36.7 C)   Results for orders placed or performed during the hospital encounter of 10/30/16  Urinalysis, Routine w reflex microscopic  Result Value Ref Range   Color, Urine YELLOW YELLOW   APPearance CLEAR CLEAR   Specific Gravity, Urine 1.013 1.005 - 1.030   pH 6.5 5.0 - 8.0   Glucose, UA NEGATIVE NEGATIVE mg/dL   Hgb urine dipstick NEGATIVE NEGATIVE   Bilirubin Urine NEGATIVE NEGATIVE   Ketones, ur NEGATIVE NEGATIVE mg/dL   Protein, ur NEGATIVE NEGATIVE mg/dL   Nitrite NEGATIVE NEGATIVE   Leukocytes, UA  TRACE (A) NEGATIVE  CBC with Differential/Platelet  Result Value Ref Range   WBC 10.2 4.0 - 10.5 K/uL   RBC 3.71 (L) 4.22 - 5.81 MIL/uL   Hemoglobin 11.3 (L) 13.0 - 17.0 g/dL   HCT 33.4 (L) 39.0 - 52.0 %   MCV 90.0 78.0 - 100.0 fL   MCH 30.5 26.0 - 34.0 pg   MCHC 33.8 30.0 - 36.0 g/dL   RDW 15.4 11.5 - 15.5 %   Platelets 261 150 - 400 K/uL   Neutrophils Relative % 53 %   Neutro Abs 5.5 1.7 - 7.7 K/uL   Lymphocytes Relative 20 %   Lymphs Abs 2.0 0.7 - 4.0 K/uL   Monocytes Relative 13 %   Monocytes Absolute 1.3 (H) 0.1 - 1.0 K/uL   Eosinophils Relative 13 %   Eosinophils Absolute 1.4 (H) 0.0 - 0.7 K/uL   Basophils Relative 1 %   Basophils Absolute 0.1 0.0 - 0.1 K/uL  Basic metabolic panel  Result Value Ref Range   Sodium 133 (L) 135 - 145 mmol/L   Potassium 4.2 3.5 - 5.1 mmol/L   Chloride 101 101 - 111 mmol/L   CO2 23 22 - 32 mmol/L   Glucose, Bld 155 (H) 65 - 99 mg/dL   BUN 17 6 - 20 mg/dL   Creatinine, Ser 1.01 0.61 - 1.24 mg/dL   Calcium 9.3 8.9 - 10.3 mg/dL   GFR calc non Af Amer >60 >60 mL/min   GFR calc Af Amer >60 >60 mL/min   Anion gap 9 5 - 15  Urinalysis, Microscopic (reflex)  Result Value Ref Range   RBC / HPF NONE SEEN 0 - 5 RBC/hpf   WBC, UA 0-5 0 - 5 WBC/hpf   Bacteria, UA RARE (A) NONE SEEN   Squamous Epithelial / LPF 0-5 (A) NONE SEEN   Dg Chest 2 View  Result Date: 10/31/2016 CLINICAL DATA:  Fever last night. Recent released from hospital for pneumonia 5 days ago. Former smoker. EXAM: CHEST  2 VIEW COMPARISON:  10/24/2016 FINDINGS: Increasing linear opacities  in the left lung base may represent developing pneumonia. Emphysematous changes in the lungs with scattered fibrosis. No blunting of costophrenic angles. No pneumothorax. Calcified and tortuous aorta. Normal heart size and pulmonary vascularity. Degenerative changes in the spine. IMPRESSION: Increasing linear infiltration in the left lung base may indicate developing pneumonia. Electronically Signed    By: Lucienne Capers M.D.   On: 10/31/2016 01:00   Dg Chest Port 1 View  Result Date: 10/24/2016 CLINICAL DATA:  Sepsis EXAM: PORTABLE CHEST 1 VIEW COMPARISON:  05/30/2016 FINDINGS: Diffuse interstitial prominence, new. This could represent an infectious pneumonitis, but noninfectious inflammatory or fibrotic processes are also possible. Interstitial edema appears less likely, given the lack of accompanying vascular changes. No airspace consolidation. No effusions. Hilar, mediastinal and cardiac contours are unremarkable. IMPRESSION: Diffuse interstitial prominence, possibly infectious or inflammatory. Electronically Signed   By: Andreas Newport M.D.   On: 10/24/2016 18:40    Procedures Procedures (including critical care time)  Medications Ordered in ED  Medications  vancomycin (VANCOCIN) IVPB 1000 mg/200 mL premix (not administered)  piperacillin-tazobactam (ZOSYN) IVPB 3.375 g (not administered)       Final Clinical Impressions(s) / ED Diagnoses  [facility]AP will readmit to medicine  New Prescriptions New Prescriptions   No medications on file     Deovion Batrez, MD 10/31/16 0151

## 2016-10-31 NOTE — H&P (Signed)
History and Physical    [name] DOB: 1924/04/29 DOA: 10/30/2016  PCP: Gerald Rio, PA-C Patient coming from: home  Chief Complaint: generalized weakness/fever  HPI: Gerald Holt is a delightful 83 y.o. male with medical history significant for diabetes, CVA, frequent UTIs secondary to self cath, A. Fib presents to 5 W. room 16 from [facility] chief complaint generalized weakness and fever. Initial evaluation concerning for pneumonia  Information is obtained from the patient and his caregiver who is at the bedside. He was discharged 4 days ago after a hospitalization for sepsis secondary to urinary tract infection. Caregiver said he's been doing well since that time until yesterday when he complained of being "more tired than usual". Associated symptoms include decreased appetite decreased oral intake is decreased as well. Yesterday evening he developed a dry cough. He denies headache dizziness syncope or near-syncope. He denies chest pain palpitation shortness of breath nausea vomiting. He does endorse loose stools but attributes that to the antibiotic he's been taking for his urinary tract infection. He denies lower extremity edema or orthopnea. Of note he had a chest x-ray during his last hospitalization feeling inflammatory vs infectious changes.    ED Course: In the emergency department he has a rectal temp of 99 he is hemodynamically stable and not hypoxic.  Review of Systems: As per HPI otherwise 10 point review of systems negative.   Ambulatory Status: He ambulates independently with a fairly steady gait no recent falls  Past Medical History:  Diagnosis Date  . A-fib ([place])   . Cataracts, bilateral   . Chronic dermatitis   . Diabetes type 2, controlled ([place]) 2000  . History of chicken pox   . Mumps    Adult  . Retinopathy   . Shingles   . Stroke [facility]) Nov 2011  . Undescended testicle, unilateral    Right  . Whooping cough     Past  Surgical History:  Procedure Laterality Date  . PRE-MALIGNANT / BENIGN SKIN LESION EXCISION    . TESTICLE SURGERY     Right, undescended  . TOOTH EXTRACTION      Social History   Social History  . Marital status: Married    Spouse name: N/A  . Number of children: N/A  . Years of education: N/A   Occupational History  . Not on file.   Social History Main Topics  . Smoking status: Former Research scientist (life sciences)  . Smokeless tobacco: Never Used     Comment: Hasn't smoked since age 47  . Alcohol use No  . Drug use: No  . Sexual activity: Not on file   Other Topics Concern  . Not on file   Social History Narrative   Lives with wife in a one story home.  Has 1 child.  Retired Armed forces technical officer.  Education: Oceanographer.      Allergies  Allergen Reactions  . Flomax [Tamsulosin Hcl] Other (See Comments)    Hypotension  . Shellfish Allergy Anaphylaxis  . Aspirin-Dipyridamole Er Other (See Comments)    Headaches   . Other Other (See Comments)    Blood Thinner given in Rehab gave H/As - Not Coumadin/Headaches      Family History  Problem Relation Age of Onset  . Pneumonia Mother 49    Deceased  . GI Bleed Father 108    Deceased - Ulcers  . Diabetes Cousin   . Diabetes Brother   . Cancer Paternal Aunt     Prior to Admission  medications   Medication Sig Start Date End Date Taking? Authorizing Provider  aspirin 81 MG tablet Take 1 tablet (81 mg total) by mouth daily. 06/22/15  Yes Jerline Pain, MD  ciprofloxacin (CIPRO) 500 MG tablet Take 1 tablet (500 mg total) by mouth 2 (two) times daily. 10/27/16 10/31/16 Yes Velvet Bathe, MD  cyanocobalamin (,VITAMIN B-12,) 1000 MCG/ML injection Inject 1000 mcg intramuscular daily for 7 days, then once a week for 4 weeks, then once a month for a year Patient taking differently: Inject 1,000 mcg into the muscle every 30 (thirty) days.  04/02/16  Yes Adam Telford Nab, DO  docusate sodium (COLACE) 100 MG capsule Take 100-200 mg by mouth See admin instructions. Take  100 mg by mouth daily for 2 days, then on every third day take 200 mg by mouth daily. Alternate 100 mg and 200 mg   Yes Historical Provider, MD  Eyelid Cleansers (STERILID EX) Apply 1 application topically See admin instructions. Eye Wash: Once Daily    Yes Historical Provider, MD  glipiZIDE (GLUCOTROL XL) 2.5 MG 24 hr tablet Take 1 tablet (2.5 mg total) by mouth daily with breakfast. 10/17/16  Yes [name], PA-C  levothyroxine (SYNTHROID, LEVOTHROID) 50 MCG tablet Take 1 tablet (50 mcg total) by mouth daily. 04/09/16  Yes [name], PA-C  lisinopril (PRINIVIL,ZESTRIL) 2.5 MG tablet Take 1 tablet (2.5 mg total) by mouth daily. 04/09/16  Yes [name], PA-C  lovastatin (MEVACOR) 10 MG tablet Take 5 mg by mouth at bedtime.  08/21/16  Yes Historical Provider, MD  metFORMIN (GLUCOPHAGE-XR) 500 MG 24 hr tablet Take 2 tablets with breakfast and at bedtime Patient taking differently: Take 1,000 mg by mouth 2 (two) times daily.  08/21/16  Yes [name], PA-C  metoprolol tartrate (LOPRESSOR) 25 MG tablet Take 0.5 tablets (12.5 mg total) by mouth 2 (two) times daily. 04/09/16  Yes [name], PA-C  Miconazole Nitrate (TRIPLE PASTE AF) 2 % OINT Apply 1 application topically once a week.    Yes Historical Provider, MD  Omega-3 Fatty Acids (SB OMEGA-3 FISH OIL) 1000 MG CAPS Take 1,000 mg by mouth 2 (two) times daily. Reported on 10/06/2015   Yes Historical Provider, MD  OVER THE COUNTER MEDICATION Take 1 capsule by mouth 2 (two) times daily. OTC Focus Select   Yes Historical Provider, MD  triamcinolone cream (KENALOG) 0.1 % Apply 1 application topically as needed (for itching).  02/01/16  Yes Historical Provider, MD  Calcium Carbonate Antacid (ALKA-SELTZER ANTACID PO) Take 2 tablets by mouth as needed (for indegestion).     Historical Provider, MD    Physical Exam: Vitals:   10/31/16 0054 10/31/16 0547 10/31/16 0835 10/31/16 0953  BP:  134/76 103/78 131/71  Pulse:  78 71 83    Resp:  20 18 16   Temp: 100.4 F (38 C) 99 F (37.2 C) 98.9 F (37.2 C) 97.6 F (36.4 C)  TempSrc: Rectal Rectal Oral Oral  SpO2:  94% 94% 98%  Weight:      Height:         General:  Appears calm and comfortable And smiling Eyes:  PERRL, EOMI, normal lids, iris ENT:  grossly normal hearing, lips & tongue, mucous membranes of his mouth are pink only slightly dry Neck:  no LAD, masses or thyromegaly Cardiovascular:  RRR, no m/r/g. No LE edema. Pedal pulses present and palpable Respiratory:   Normal respiratory effort. Respirations slightly shallow breath sounds somewhat diminished hear  no crackles no wheezes Abdomen:  soft, ntnd, positive bowel sounds no guarding or rebounding, catheter intact draining clear yellow urine Skin:  no rash or induration seen on limited exam Musculoskeletal:  grossly normal tone BUE/BLE, good ROM, no bony abnormality Psychiatric:  grossly normal mood and affect, speech fluent and appropriate, AOx3 Neurologic:  CN 2-12 grossly intact, moves all extremities in coordinated fashion, sensation intact  Labs on Admission: I have personally reviewed following labs and imaging studies  CBC:  Recent Labs Lab 10/24/16 1708 10/25/16 0109 10/27/16 0442 10/31/16 0037  WBC 12.1* 9.3 6.9 10.2  NEUTROABS 8.8*  --   --  5.5  HGB 12.2* 10.5* 10.9* 11.3*  HCT 35.7* 31.1* 32.1* 33.4*  MCV 89.9 88.9 87.7 90.0  PLT 202 159 199 0000000   Basic Metabolic Panel:  Recent Labs Lab 10/24/16 1708 10/25/16 0109 10/27/16 0442 10/31/16 0037  NA 132* 137 134* 133*  K 5.1 4.4 4.2 4.2  CL 98* 106 103 101  CO2 23 22 23 23   GLUCOSE 202* 151* 165* 155*  BUN 25* 18 13 17   CREATININE 1.19 1.07 1.17 1.01  CALCIUM 9.4 8.6* 9.1 9.3   GFR: Estimated Creatinine Clearance: 43.9 mL/min (by C-G formula based on SCr of 1.01 mg/dL). Liver Function Tests:  Recent Labs Lab 10/24/16 1708  AST 56*  ALT 78*  ALKPHOS 59  BILITOT 0.6  PROT 7.2  ALBUMIN 3.7   No results for  input(s): LIPASE, AMYLASE in the last 168 hours. No results for input(s): AMMONIA in the last 168 hours. Coagulation Profile: No results for input(s): INR, PROTIME in the last 168 hours. Cardiac Enzymes: No results for input(s): CKTOTAL, CKMB, CKMBINDEX, TROPONINI in the last 168 hours. BNP (last 3 results) No results for input(s): PROBNP in the last 8760 hours. HbA1C: No results for input(s): HGBA1C in the last 72 hours. CBG:  Recent Labs Lab 10/26/16 1145 10/26/16 1654 10/26/16 2108 10/27/16 0806 10/27/16 1159  GLUCAP 168* 144* 129* 164* 166*   Lipid Profile: No results for input(s): CHOL, HDL, LDLCALC, TRIG, CHOLHDL, LDLDIRECT in the last 72 hours. Thyroid Function Tests: No results for input(s): TSH, T4TOTAL, FREET4, T3FREE, THYROIDAB in the last 72 hours. Anemia Panel: No results for input(s): VITAMINB12, FOLATE, FERRITIN, TIBC, IRON, RETICCTPCT in the last 72 hours. Urine analysis:    Component Value Date/Time   COLORURINE YELLOW 10/31/2016 0037   APPEARANCEUR CLEAR 10/31/2016 0037   LABSPEC 1.013 10/31/2016 0037   PHURINE 6.5 10/31/2016 0037   GLUCOSEU NEGATIVE 10/31/2016 0037   GLUCOSEU NEGATIVE 10/17/2016 1533   HGBUR NEGATIVE 10/31/2016 0037   BILIRUBINUR NEGATIVE 10/31/2016 0037   KETONESUR NEGATIVE 10/31/2016 0037   PROTEINUR NEGATIVE 10/31/2016 0037   UROBILINOGEN 0.2 10/17/2016 1533   NITRITE NEGATIVE 10/31/2016 0037   LEUKOCYTESUR TRACE (A) 10/31/2016 0037    Creatinine Clearance: Estimated Creatinine Clearance: 43.9 mL/min (by C-G formula based on SCr of 1.01 mg/dL).  Sepsis Labs: @LABRCNTIP (procalcitonin:4,lacticidven:4) ) Recent Results (from the past 240 hour(s))  Blood Culture (routine x 2)     Status: None   Collection Time: 10/24/16  5:00 PM  Result Value Ref Range Status   Specimen Description BLOOD LEFT ANTECUBITAL  Final   Special Requests   Final    BOTTLES DRAWN AEROBIC AND ANAEROBIC 5CC BOTH BOTTLES   Culture   Final    NO GROWTH  5 DAYS Performed at [facility], [address] [place], [place] [address]    Report Status 10/29/2016  FINAL  Final  Blood Culture (routine x 2)     Status: None   Collection Time: 10/24/16  5:10 PM  Result Value Ref Range Status   Specimen Description BLOOD LEFT FOREARM  Final   Special Requests   Final    BOTTLES DRAWN AEROBIC AND ANAEROBIC 5CC BOTH BOTTLES   Culture   Final    NO GROWTH 5 DAYS Performed at [facility], [place] [address], [place], [place] [address]    Report Status 10/29/2016 FINAL  Final  Urine culture     Status: Abnormal   Collection Time: 10/24/16  5:47 PM  Result Value Ref Range Status   Specimen Description URINE, CLEAN CATCH  Final   Special Requests NONE  Final   Culture >=100,000 COLONIES/mL ESCHERICHIA COLI (A)  Final   Report Status 10/27/2016 FINAL  Final   Organism ID, Bacteria ESCHERICHIA COLI (A)  Final      Susceptibility   Escherichia coli - MIC*    AMPICILLIN 8 SENSITIVE Sensitive     CEFAZOLIN <=4 SENSITIVE Sensitive     CEFTRIAXONE <=1 SENSITIVE Sensitive     CIPROFLOXACIN <=0.25 SENSITIVE Sensitive     GENTAMICIN <=1 SENSITIVE Sensitive     IMIPENEM <=0.25 SENSITIVE Sensitive     NITROFURANTOIN <=16 SENSITIVE Sensitive     TRIMETH/SULFA <=20 SENSITIVE Sensitive     AMPICILLIN/SULBACTAM 4 SENSITIVE Sensitive     PIP/TAZO <=4 SENSITIVE Sensitive     Extended ESBL NEGATIVE Sensitive     * >=100,000 COLONIES/mL ESCHERICHIA COLI  MRSA PCR Screening     Status: None   Collection Time: 10/24/16 11:09 PM  Result Value Ref Range Status   MRSA by PCR NEGATIVE NEGATIVE Final    Comment:        The GeneXpert MRSA Assay (FDA approved for NASAL specimens only), is one component of a comprehensive MRSA colonization surveillance program. It is not intended to diagnose MRSA infection nor to guide or monitor treatment for MRSA infections.   Respiratory Panel by PCR     Status: None   Collection Time: 10/25/16 12:44 AM  Result  Value Ref Range Status   Adenovirus NOT DETECTED NOT DETECTED Final   Coronavirus 229E NOT DETECTED NOT DETECTED Final   Coronavirus HKU1 NOT DETECTED NOT DETECTED Final   Coronavirus NL63 NOT DETECTED NOT DETECTED Final   Coronavirus OC43 NOT DETECTED NOT DETECTED Final   Metapneumovirus NOT DETECTED NOT DETECTED Final   Rhinovirus / Enterovirus NOT DETECTED NOT DETECTED Final   Influenza A NOT DETECTED NOT DETECTED Final   Influenza B NOT DETECTED NOT DETECTED Final   Parainfluenza Virus 1 NOT DETECTED NOT DETECTED Final   Parainfluenza Virus 2 NOT DETECTED NOT DETECTED Final   Parainfluenza Virus 3 NOT DETECTED NOT DETECTED Final   Parainfluenza Virus 4 NOT DETECTED NOT DETECTED Final   Respiratory Syncytial Virus NOT DETECTED NOT DETECTED Final   Bordetella pertussis NOT DETECTED NOT DETECTED Final   Chlamydophila pneumoniae NOT DETECTED NOT DETECTED Final   Mycoplasma pneumoniae NOT DETECTED NOT DETECTED Final  Blood culture (routine x 2)     Status: None (Preliminary result)   Collection Time: 10/31/16  2:10 AM  Result Value Ref Range Status   Specimen Description BLOOD LEFT FOREARM  Final   Special Requests BOTTLES DRAWN AEROBIC AND ANAEROBIC 5CC EACH  Final   Culture PENDING  Incomplete   Report Status PENDING  Incomplete     Radiological Exams on Admission: Dg  Chest 2 View  Result Date: 10/31/2016 CLINICAL DATA:  Fever last night. Recent released from hospital for pneumonia 5 days ago. Former smoker. EXAM: CHEST  2 VIEW COMPARISON:  10/24/2016 FINDINGS: Increasing linear opacities in the left lung base may represent developing pneumonia. Emphysematous changes in the lungs with scattered fibrosis. No blunting of costophrenic angles. No pneumothorax. Calcified and tortuous aorta. Normal heart size and pulmonary vascularity. Degenerative changes in the spine. IMPRESSION: Increasing linear infiltration in the left lung base may indicate developing pneumonia. Electronically Signed    By: Lucienne Capers M.D.   On: 10/31/2016 01:00    EKG:   Assessment/Plan Principal Problem:   HCAP (healthcare-associated pneumonia) Active Problems:   Diabetes mellitus type II, uncontrolled (HCC)   Paroxysmal atrial fibrillation (HCC)   Hyperlipidemia   Gastroesophageal reflux disease without esophagitis   Hypothyroidism   Urinary retention   1. Healthcare associated pneumonia. Chest x-ray with increasing linear infiltration in the left lung base may indicate developing pneumonia. Max temp 99 rectal. He is hemodynamically stable and nontoxic appearing. He is not hypoxic. Antibiotics initiated at [place] to telemetry -Follow blood cultures -Obtain sputum culture if able -Strep pneumo urine antigen -Antibiotics per protocol -When necessary antitussives -Monitor closely  #2. Diabetes. Serum glucose 155. Home medications include oral agents. -We'll hold oral agents for now -Riding scale for optimal control  #3. A. fib. Rate controlled. Not on anticoagulation. chadvasc score 4. Home medications include Lopressor -We'll hold this for now -Monitor closely and resume as indicated  3. Hypothyroidism -continue home meds  4. Urinary retention. Chronic. performs intermittent caths. Chart review indicates recent office visit to urology. Progress note indicates history moderately obstructive appearing prostate with trilobar hyperplasia/intravesical. recent urinary tract infection discharged on Cipro. Urinalysis with trace leukocytes otherwise unremarkable. Foley catheter inserted at [facility] rationale being this would decrease his risk of urinary tract infection versus intermittent in and out -Continue Foley -Monitor her and output   DVT prophylaxis: scd  Code Status: dnr  Family Communication: caregiver at bedside  Disposition Plan: home  Consults called: none  Admission status: inpatient    Radene Gunning MD [facility]  If 7PM-7AM,  please contact night-coverage [email or web address] Password [facility]  10/31/2016, 11:57 AM

## 2016-10-31 NOTE — Telephone Encounter (Signed)
Noted. We are following his progress.

## 2016-10-31 NOTE — ED Notes (Signed)
Gerald Holt [id number]( home) [phone]( cell) please leave a message with room information

## 2016-10-31 NOTE — Progress Notes (Signed)
Pt was extremely agitated with nebulizer. Order was discontined.

## 2016-11-01 ENCOUNTER — Ambulatory Visit: Payer: Medicare Other | Admitting: Physician Assistant

## 2016-11-01 DIAGNOSIS — E1165 Type 2 diabetes mellitus with hyperglycemia: Secondary | ICD-10-CM

## 2016-11-01 DIAGNOSIS — E039 Hypothyroidism, unspecified: Secondary | ICD-10-CM

## 2016-11-01 DIAGNOSIS — K219 Gastro-esophageal reflux disease without esophagitis: Secondary | ICD-10-CM

## 2016-11-01 LAB — CBC
HEMATOCRIT: 33.8 % — AB (ref 39.0–52.0)
HEMOGLOBIN: 11.2 g/dL — AB (ref 13.0–17.0)
MCH: 29.3 pg (ref 26.0–34.0)
MCHC: 33.1 g/dL (ref 30.0–36.0)
MCV: 88.5 fL (ref 78.0–100.0)
Platelets: 243 10*3/uL (ref 150–400)
RBC: 3.82 MIL/uL — ABNORMAL LOW (ref 4.22–5.81)
RDW: 15.6 % — AB (ref 11.5–15.5)
WBC: 8.5 10*3/uL (ref 4.0–10.5)

## 2016-11-01 LAB — BASIC METABOLIC PANEL
ANION GAP: 11 (ref 5–15)
BUN: 15 mg/dL (ref 6–20)
CHLORIDE: 100 mmol/L — AB (ref 101–111)
CO2: 22 mmol/L (ref 22–32)
Calcium: 9.2 mg/dL (ref 8.9–10.3)
Creatinine, Ser: 1.15 mg/dL (ref 0.61–1.24)
GFR calc Af Amer: >60 mL/min (ref >60)
GFR, EST NON AFRICAN AMERICAN: 54 mL/min — AB (ref >60)
GLUCOSE: 169 mg/dL — AB (ref 65–99)
POTASSIUM: 4.2 mmol/L (ref 3.5–5.1)
Sodium: 133 mmol/L — ABNORMAL LOW (ref 135–145)

## 2016-11-01 LAB — GLUCOSE, CAPILLARY
GLUCOSE-CAPILLARY: 182 mg/dL — AB (ref 65–99)
GLUCOSE-CAPILLARY: 181 mg/dL — AB (ref 65–99)
Glucose-Capillary: 155 mg/dL — ABNORMAL HIGH (ref 65–99)
Glucose-Capillary: 144 mg/dL — ABNORMAL HIGH (ref 65–99)

## 2016-11-01 NOTE — Progress Notes (Signed)
Physical Therapy Evaluation Patient Details Name: Gerald Holt MRN: [id number] DOB: December 18, 1923 Today's Date: 11/01/2016   History of Present Illness  83 y.o. male admitted with possible PNA.  PMH medical history significant of DM type 2, CVA, UTIs, PAF, self in&out catherization; who presented with complaints of subjective fever and with cold chills shaking; Sepsis secondary to UTI  Clinical Impression  Patient presents with decreased endurance compared to baseline. He reports at baseline he was not using his walker. His wife reports over the past month his mobility has been decreasing progressively. He is very motivated to particiapte. He ambulated 150' but was fatigued and required frequent cuing for safety with the walker. He would benefit from further skilled therapy.     Follow Up Recommendations Home health PT;Supervision/Assistance - 24 hour    Equipment Recommendations  Rolling walker with 5" wheels;3in1 (PT) (may already have)    Recommendations for Other Services       Precautions / Restrictions Restrictions Weight Bearing Restrictions: No      Mobility  Bed Mobility Overal bed mobility: Needs Assistance Bed Mobility: Supine to Sit     Supine to sit: Supervision     General bed mobility comments: needed hands to xscoot his hips to the edge of the bed.   Transfers Overall transfer level: Needs assistance Equipment used: Rolling walker (2 wheeled) Transfers: Sit to/from Stand Sit to Stand: Min guard         General transfer comment: Guuard 2nd to decreased initial balance. When sitting he required mod cuing to stay in the walker.   Ambulation/Gait Ambulation/Gait assistance: Min guard Ambulation Distance (Feet): 150 Feet Assistive device: Rolling walker (2 wheeled) Gait Pattern/deviations: Step-through pattern;Decreased stride length;Trunk flexed     General Gait Details: Cues to stay in the walker. He lets the walker drift far in front of him.    Stairs            Wheelchair Mobility    Modified Rankin (Stroke Patients Only)       Balance                                             Pertinent Vitals/Pain Pain Assessment: No/denies pain    Home Living Family/patient expects to be discharged to:: Private residence Living Arrangements: Spouse/significant other (and sister in Sports coach) Available Help at Discharge: Family;Personal care attendant (Assist in home M-F most of day, if not all) Type of Home: House Home Access: Stairs to enter   [employer] of Steps: 1 Home Layout: One level Home Equipment: Environmental consultant - 2 wheels      Prior Function Level of Independence: Needs assistance   Gait / Transfers Assistance Needed: Walks without assistive device; tells me HHPT had somewhat recently helped him wean off of cane  ADL's / Homemaking Assistance Needed: [facility]        Hand Dominance        Extremity/Trunk Assessment   Upper Extremity Assessment Upper Extremity Assessment: Generalized weakness    Lower Extremity Assessment Lower Extremity Assessment: Generalized weakness       Communication   Communication: HOH  Cognition Arousal/Alertness: Awake/alert Behavior During Therapy: WFL for tasks assessed/performed Overall Cognitive Status: Within Functional Limits for tasks assessed (for simple mobility tasks; noted h/o mild dementia)  General Comments      Exercises     Assessment/Plan    PT Assessment Patient needs continued PT services  PT Problem List Decreased strength;Decreased range of motion;Decreased activity tolerance;Decreased balance;Decreased mobility;Decreased coordination;Decreased cognition;Decreased knowledge of use of DME;Decreased safety awareness          PT Treatment Interventions DME instruction;Gait training;Stair training;Functional mobility training;Therapeutic activities;Therapeutic  exercise;Balance training;Patient/family education    PT Goals (Current goals can be found in the Care Plan section)  Acute Rehab PT Goals Patient Stated Goal: get stronger PT Goal Formulation: With patient Time For Goal Achievement: 11/10/16 Potential to Achieve Goals: Good    Frequency Min 3X/week   Barriers to discharge        Co-evaluation               End of Session Equipment Utilized During Treatment: Gait belt Activity Tolerance: Patient tolerated treatment well Patient left: in chair;with call bell/phone within reach;with family/visitor present;with chair alarm set Nurse Communication: Mobility status         Time: [id number] PT Time Calculation (min) (ACUTE ONLY): 23 min   Charges:   PT Evaluation $PT Eval Moderate Complexity: 1 Procedure     PT G Codes:        Carney Living PT DPT  11/01/2016, 3:40 PM

## 2016-11-01 NOTE — Progress Notes (Addendum)
[facility]                                                                              Patient Demographics  Gerald Holt, is a 83 y.o. male, DOB - [birth year], [id number]  Admit date - 10/30/2016   Admitting Physician Ivor Costa, MD  Outpatient Primary MD for the patient is Hassell Done Sandria Manly, PA-C  Outpatient specialists:   LOS - 1  days    Chief Complaint  Patient presents with  . Fever       Brief summary  Patient is a 83 year old male with diabetes, CVA, frequent UTIs secondary to self cath, atrial fibrillation presented with generalized weakness and fever. Patient was apparently discharged 4 days ago after hospitalization for sepsis secondary to UTI. Patient's caregiver reported that he was doing well until the day before the admission when he complained of being more tired than usual. Then he developed a dry cough, loose stool. In ED had a rectal temp of 99 but no hypoxia. Chest x-ray showed increasing linear infiltration in the left lung base indicating developing pneumonia.   Assessment & Plan   Principal problem Healthcare associated pneumonia.  - Chest x-ray showed increasing linear infiltration in the left lung base may indicate developing pneumonia. -  Follow blood cultures, sputum cultures, urine strep antigen - Continue IV vancomycin, cefepime - PT, OT evaluation, feels very weak  Active problems Diabetes mellitus type 2 on oral hypoglycemics. - hold oral agents for now -Follow sliding scale insulin   Paroxysmal atrial fibrillation - Currently Rate controlled.  - Not on anticoagulation. chadvasc score 4. Home medications include Lopressor -BP currently soft, holding antihypertensives, resume Lopressor when able to  Hypothyroidism -continue  Synthroid  Chronic Urinary retention. -  performs intermittent caths. Chart review indicates recent office visit to urology. Progress note indicates history moderately obstructive  appearing prostate with trilobar hyperplasia/intravesical. recent urinary tract infection discharged on Cipro. Urinalysis with trace leukocytes otherwise unremarkable. Foley catheter inserted at [facility] rationale being this would decrease his risk of urinary tract infection versus intermittent in and out -Continue Foley  Code Status: DNR  DVT Prophylaxis:  Lovenox  Family Communication: Discussed in detail with the patient, all imaging results, lab results explained to the patient   Disposition Plan: Monitor next 24-48 hours on IV antibiotics, PT evaluation  Time Spent in minutes   25 minutes  Procedures:    Consultants:   NONE   Antimicrobials:   IV vancomycin 2/8  IV cefepime 2/8   Medications  Scheduled Meds: . aspirin EC  81 mg Oral Daily  . ceFEPime (MAXIPIME) IV  1 g Intravenous Q24H  . docusate sodium  100 mg Oral BID  . enoxaparin (LOVENOX) injection  40 mg Subcutaneous Daily  . insulin aspart  0-5 Units Subcutaneous QHS  . insulin aspart  0-9 Units Subcutaneous TID WC  . levothyroxine  50 mcg Oral QAC breakfast  . lisinopril  2.5 mg Oral Daily  . pravastatin  10 mg Oral q1800  . vancomycin  500 mg Intravenous Q12H   Continuous Infusions: PRN Meds:.acetaminophen **OR** acetaminophen, albuterol, bisacodyl, HYDROcodone-acetaminophen,  ondansetron **OR** ondansetron (ZOFRAN) IV, traZODone   Antibiotics   Anti-infectives    Start     Dose/Rate Route Frequency Ordered Stop   10/31/16 1200  ceFEPIme (MAXIPIME) 1 g in dextrose 5 % 50 mL IVPB     1 g 100 mL/hr over 30 Minutes Intravenous Every 24 hours 10/31/16 1100 11/08/16 1159   10/31/16 1200  vancomycin (VANCOCIN) 500 mg in sodium chloride 0.9 % 100 mL IVPB     500 mg 100 mL/hr over 60 Minutes Intravenous Every 12 hours 10/31/16 1124     10/31/16 0130  vancomycin (VANCOCIN) IVPB 1000 mg/200 mL premix     1,000 mg 200 mL/hr over 60 Minutes Intravenous  Once 10/31/16 0116 10/31/16 0631   10/31/16  0130  piperacillin-tazobactam (ZOSYN) IVPB 3.375 g     3.375 g 100 mL/hr over 30 Minutes Intravenous  Once 10/31/16 0116 10/31/16 0303        Subjective:   Gerald Holt was seen and examined today.  Feels slightly better from yesterday although still weak, has not gotten up from the bed yet. No fevers but occasional coughing. Hard of hearing. Patient denies dizziness, chest pain, shortness of breath, abdominal pain, N/V/D/C, new weakness, numbess, tingling. No acute events overnight.    Objective:   Vitals:   10/31/16 0953 10/31/16 1351 10/31/16 2221 11/01/16 0526  BP: 131/71  122/77 125/62  Pulse: 83  91 88  Resp: 16  17 16   Temp: 97.6 F (36.4 C)  98.4 F (36.9 C) 98.2 F (36.8 C)  TempSrc: Oral   Oral  SpO2: 98% 96% 96% 97%  Weight:      Height:        Intake/Output Summary (Last 24 hours) at 11/01/16 0923 Last data filed at 11/01/16 0850  Gross per 24 hour  Intake           563.33 ml  Output             3280 ml  Net         -2716.67 ml     Wt Readings from Last 3 Encounters:  10/30/16 69.4 kg (153 lb)  10/26/16 69.8 kg (153 lb 12.8 oz)  10/18/16 72.1 kg (159 lb)     Exam  General: Alert and oriented x 3, NAD, Frail, hard of hearing  HEENT:  PERRLA, EOMI, Anicteric Sclera, mucous membranes moist.   Neck: Supple, no JVD  Cardiovascular: S1 S2 auscultated, no rubs, murmurs or gallops. Regular rate and rhythm.  Respiratory: Decreased breath sounds at the bases  Gastrointestinal: Soft, nontender, nondistended, + bowel sounds  Ext: no cyanosis clubbing or edema  Neuro: AAOx3, Cr N's II- XII. Strength 5/5 upper and lower extremities bilaterally  Skin: No rashes  Psych: Normal affect and demeanor, alert and oriented x3    Data Reviewed:  I have personally reviewed following labs and imaging studies  Micro Results Recent Results (from the past 240 hour(s))  Blood Culture (routine x 2)     Status: None   Collection Time: 10/24/16  5:00 PM  Result  Value Ref Range Status   Specimen Description BLOOD LEFT ANTECUBITAL  Final   Special Requests   Final    BOTTLES DRAWN AEROBIC AND ANAEROBIC 5CC BOTH BOTTLES   Culture   Final    NO GROWTH 5 DAYS Performed at [facility], [address], [place], [place] [address]    Report Status 10/29/2016 FINAL  Final  Blood Culture (routine  x 2)     Status: None   Collection Time: 10/24/16  5:10 PM  Result Value Ref Range Status   Specimen Description BLOOD LEFT FOREARM  Final   Special Requests   Final    BOTTLES DRAWN AEROBIC AND ANAEROBIC 5CC BOTH BOTTLES   Culture   Final    NO GROWTH 5 DAYS Performed at [facility], [place] [address] [place], [place] [address]    Report Status 10/29/2016 FINAL  Final  Urine culture     Status: Abnormal   Collection Time: 10/24/16  5:47 PM  Result Value Ref Range Status   Specimen Description URINE, CLEAN CATCH  Final   Special Requests NONE  Final   Culture >=100,000 COLONIES/mL ESCHERICHIA COLI (A)  Final   Report Status 10/27/2016 FINAL  Final   Organism ID, Bacteria ESCHERICHIA COLI (A)  Final      Susceptibility   Escherichia coli - MIC*    AMPICILLIN 8 SENSITIVE Sensitive     CEFAZOLIN <=4 SENSITIVE Sensitive     CEFTRIAXONE <=1 SENSITIVE Sensitive     CIPROFLOXACIN <=0.25 SENSITIVE Sensitive     GENTAMICIN <=1 SENSITIVE Sensitive     IMIPENEM <=0.25 SENSITIVE Sensitive     NITROFURANTOIN <=16 SENSITIVE Sensitive     TRIMETH/SULFA <=20 SENSITIVE Sensitive     AMPICILLIN/SULBACTAM 4 SENSITIVE Sensitive     PIP/TAZO <=4 SENSITIVE Sensitive     Extended ESBL NEGATIVE Sensitive     * >=100,000 COLONIES/mL ESCHERICHIA COLI  MRSA PCR Screening     Status: None   Collection Time: 10/24/16 11:09 PM  Result Value Ref Range Status   MRSA by PCR NEGATIVE NEGATIVE Final    Comment:        The GeneXpert MRSA Assay (FDA approved for NASAL specimens only), is one component of a comprehensive MRSA colonization surveillance program. It is  not intended to diagnose MRSA infection nor to guide or monitor treatment for MRSA infections.   Respiratory Panel by PCR     Status: None   Collection Time: 10/25/16 12:44 AM  Result Value Ref Range Status   Adenovirus NOT DETECTED NOT DETECTED Final   Coronavirus 229E NOT DETECTED NOT DETECTED Final   Coronavirus HKU1 NOT DETECTED NOT DETECTED Final   Coronavirus NL63 NOT DETECTED NOT DETECTED Final   Coronavirus OC43 NOT DETECTED NOT DETECTED Final   Metapneumovirus NOT DETECTED NOT DETECTED Final   Rhinovirus / Enterovirus NOT DETECTED NOT DETECTED Final   Influenza A NOT DETECTED NOT DETECTED Final   Influenza B NOT DETECTED NOT DETECTED Final   Parainfluenza Virus 1 NOT DETECTED NOT DETECTED Final   Parainfluenza Virus 2 NOT DETECTED NOT DETECTED Final   Parainfluenza Virus 3 NOT DETECTED NOT DETECTED Final   Parainfluenza Virus 4 NOT DETECTED NOT DETECTED Final   Respiratory Syncytial Virus NOT DETECTED NOT DETECTED Final   Bordetella pertussis NOT DETECTED NOT DETECTED Final   Chlamydophila pneumoniae NOT DETECTED NOT DETECTED Final   Mycoplasma pneumoniae NOT DETECTED NOT DETECTED Final  Blood culture (routine x 2)     Status: None (Preliminary result)   Collection Time: 10/31/16  2:10 AM  Result Value Ref Range Status   Specimen Description BLOOD LEFT FOREARM  Final   Special Requests BOTTLES DRAWN AEROBIC AND ANAEROBIC 5CC EACH  Final   Culture PENDING  Incomplete   Report Status PENDING  Incomplete    Radiology Reports Dg Chest 2 View  Result Date: 10/31/2016 CLINICAL DATA:  Fever last night. Recent released from hospital for pneumonia 5 days ago. Former smoker. EXAM: CHEST  2 VIEW COMPARISON:  10/24/2016 FINDINGS: Increasing linear opacities in the left lung base may represent developing pneumonia. Emphysematous changes in the lungs with scattered fibrosis. No blunting of costophrenic angles. No pneumothorax. Calcified and tortuous aorta. Normal heart size and  pulmonary vascularity. Degenerative changes in the spine. IMPRESSION: Increasing linear infiltration in the left lung base may indicate developing pneumonia. Electronically Signed   By: Lucienne Capers M.D.   On: 10/31/2016 01:00   Dg Chest Port 1 View  Result Date: 10/24/2016 CLINICAL DATA:  Sepsis EXAM: PORTABLE CHEST 1 VIEW COMPARISON:  05/30/2016 FINDINGS: Diffuse interstitial prominence, new. This could represent an infectious pneumonitis, but noninfectious inflammatory or fibrotic processes are also possible. Interstitial edema appears less likely, given the lack of accompanying vascular changes. No airspace consolidation. No effusions. Hilar, mediastinal and cardiac contours are unremarkable. IMPRESSION: Diffuse interstitial prominence, possibly infectious or inflammatory. Electronically Signed   By: Andreas Newport M.D.   On: 10/24/2016 18:40    Lab Data:  CBC:  Recent Labs Lab 10/27/16 0442 10/31/16 0037  WBC 6.9 10.2  NEUTROABS  --  5.5  HGB 10.9* 11.3*  HCT 32.1* 33.4*  MCV 87.7 90.0  PLT 199 0000000   Basic Metabolic Panel:  Recent Labs Lab 10/27/16 0442 10/31/16 0037 11/01/16 0523  NA 134* 133* 133*  K 4.2 4.2 4.2  CL 103 101 100*  CO2 23 23 22   GLUCOSE 165* 155* 169*  BUN 13 17 15   CREATININE 1.17 1.01 1.15  CALCIUM 9.1 9.3 9.2   GFR: Estimated Creatinine Clearance: 38.5 mL/min (by C-G formula based on SCr of 1.15 mg/dL). Liver Function Tests: No results for input(s): AST, ALT, ALKPHOS, BILITOT, PROT, ALBUMIN in the last 168 hours. No results for input(s): LIPASE, AMYLASE in the last 168 hours. No results for input(s): AMMONIA in the last 168 hours. Coagulation Profile: No results for input(s): INR, PROTIME in the last 168 hours. Cardiac Enzymes: No results for input(s): CKTOTAL, CKMB, CKMBINDEX, TROPONINI in the last 168 hours. BNP (last 3 results) No results for input(s): PROBNP in the last 8760 hours. HbA1C: No results for input(s): HGBA1C in the last  72 hours. CBG:  Recent Labs Lab 10/27/16 0806 10/27/16 1159 10/31/16 1648 10/31/16 2217 11/01/16 0809  GLUCAP 164* 166* 146* 135* 155*   Lipid Profile: No results for input(s): CHOL, HDL, LDLCALC, TRIG, CHOLHDL, LDLDIRECT in the last 72 hours. Thyroid Function Tests: No results for input(s): TSH, T4TOTAL, FREET4, T3FREE, THYROIDAB in the last 72 hours. Anemia Panel: No results for input(s): VITAMINB12, FOLATE, FERRITIN, TIBC, IRON, RETICCTPCT in the last 72 hours. Urine analysis:    Component Value Date/Time   COLORURINE YELLOW 10/31/2016 0037   APPEARANCEUR CLEAR 10/31/2016 0037   LABSPEC 1.013 10/31/2016 0037   PHURINE 6.5 10/31/2016 0037   GLUCOSEU NEGATIVE 10/31/2016 0037   GLUCOSEU NEGATIVE 10/17/2016 1533   HGBUR NEGATIVE 10/31/2016 0037   BILIRUBINUR NEGATIVE 10/31/2016 0037   KETONESUR NEGATIVE 10/31/2016 0037   PROTEINUR NEGATIVE 10/31/2016 0037   UROBILINOGEN 0.2 10/17/2016 1533   NITRITE NEGATIVE 10/31/2016 0037   LEUKOCYTESUR TRACE (A) 10/31/2016 0037     Miasha Emmons M.D. [facility] 11/01/2016, 9:23 AM  Pager: [phone] Between 7am to 7pm - call Pager - [phone]  After 7pm go to [email or web address] - password [email or web address]  Call night coverage person covering after 7pm

## 2016-11-02 LAB — GLUCOSE, CAPILLARY
Glucose-Capillary: 170 mg/dL — ABNORMAL HIGH (ref 65–99)
Glucose-Capillary: 181 mg/dL — ABNORMAL HIGH (ref 65–99)
Glucose-Capillary: 139 mg/dL — ABNORMAL HIGH (ref 65–99)
Glucose-Capillary: 184 mg/dL — ABNORMAL HIGH (ref 65–99)

## 2016-11-02 NOTE — Progress Notes (Signed)
[facility]                                                                              Patient Demographics  Gerald Holt, is a 83 y.o. male, DOB - July 04, 1924, [id number]  Admit date - 10/30/2016   Admitting Physician Ivor Costa, MD  Outpatient Primary MD for the patient is Hassell Done Sandria Manly, PA-C  Outpatient specialists:   LOS - 2  days    Chief Complaint  Patient presents with  . Fever       Brief summary  Patient is a 83 year old male with diabetes, CVA, frequent UTIs secondary to self cath, atrial fibrillation presented with generalized weakness and fever. Patient was apparently discharged 4 days ago after hospitalization for sepsis secondary to UTI. Patient's caregiver reported that he was doing well until the day before the admission when he complained of being more tired than usual. Then he developed a dry cough, loose stool. In ED had a rectal temp of 99 but no hypoxia. Chest x-ray showed increasing linear infiltration in the left lung base indicating developing pneumonia.   Assessment & Plan   Principal problem Healthcare associated pneumonia.  - Chest x-ray showed increasing linear infiltration in the left lung base may indicate developing pneumonia. -  Blood cultures negative so far. Urine strep antigen negative, urine cultures negative - Continue IV vancomycin, cefepime-> transition to oral antibiotics in next 24-48 hours - PT, OT evaluation recommended home health PT OT  Active problems Diabetes mellitus type 2 on oral hypoglycemics. - hold oral agents for now -Follow sliding scale insulin   Paroxysmal atrial fibrillation - Currently Rate controlled.  - Not on anticoagulation. chadvasc score 4. Home medications include Lopressor -BP currently soft, holding antihypertensives, resume Lopressor when able to  Hypothyroidism -continue Synthroid  Chronic Urinary retention. -  performs intermittent caths. Chart review indicates  recent office visit to urology. Progress note indicates moderately obstructive appearing prostate with trilobar hyperplasia/intravesical. recent urinary tract infection, discharged on Cipro. Urinalysis with trace leukocytes otherwise unremarkable. Foley catheter inserted at [facility] rationale being this would decrease his risk of urinary tract infection versus intermittent in and out -Continue Foley  Code Status: DNR  DVT Prophylaxis:  Lovenox  Family Communication: Discussed in detail with the patient, all imaging results, lab results explained to the patient   Disposition Plan:  likely DC home in next 24-48 hours  Time Spent in minutes   25 minutes  Procedures:    Consultants:   NONE   Antimicrobials:   IV vancomycin 2/8  IV cefepime 2/8   Medications  Scheduled Meds: . aspirin EC  81 mg Oral Daily  . ceFEPime (MAXIPIME) IV  1 g Intravenous Q24H  . docusate sodium  100 mg Oral BID  . enoxaparin (LOVENOX) injection  40 mg Subcutaneous Daily  . insulin aspart  0-5 Units Subcutaneous QHS  . insulin aspart  0-9 Units Subcutaneous TID WC  . levothyroxine  50 mcg Oral QAC breakfast  . pravastatin  10 mg Oral q1800  . vancomycin  500 mg Intravenous Q12H   Continuous Infusions: PRN Meds:.acetaminophen **OR** acetaminophen,  albuterol, bisacodyl, HYDROcodone-acetaminophen, ondansetron **OR** ondansetron (ZOFRAN) IV, traZODone   Antibiotics   Anti-infectives    Start     Dose/Rate Route Frequency Ordered Stop   10/31/16 1200  ceFEPIme (MAXIPIME) 1 g in dextrose 5 % 50 mL IVPB     1 g 100 mL/hr over 30 Minutes Intravenous Every 24 hours 10/31/16 1100 11/08/16 1159   10/31/16 1200  vancomycin (VANCOCIN) 500 mg in sodium chloride 0.9 % 100 mL IVPB     500 mg 100 mL/hr over 60 Minutes Intravenous Every 12 hours 10/31/16 1124     10/31/16 0130  vancomycin (VANCOCIN) IVPB 1000 mg/200 mL premix     1,000 mg 200 mL/hr over 60 Minutes Intravenous  Once 10/31/16 0116  10/31/16 0631   10/31/16 0130  piperacillin-tazobactam (ZOSYN) IVPB 3.375 g     3.375 g 100 mL/hr over 30 Minutes Intravenous  Once 10/31/16 0116 10/31/16 0303        Subjective:   [name] was seen and examined today. Hard of hearing, feels better this morning, afebrile, less coughing. Weakness is improving. Patient denies dizziness, chest pain, shortness of breath, abdominal pain, N/V/D/C, new weakness, numbess, tingling. No acute events overnight.    Objective:   Vitals:   11/01/16 0925 11/01/16 1530 11/01/16 2118 11/02/16 0506  BP: 110/60  (!) 107/55 132/60  Pulse:  (!) 105 91 68  Resp:  16 18 18   Temp: 98.1 F (36.7 C)  98.4 F (36.9 C) 98.1 F (36.7 C)  TempSrc: Oral     SpO2: 96% 100% 93% 95%  Weight:      Height:        Intake/Output Summary (Last 24 hours) at 11/02/16 1220 Last data filed at 11/02/16 [id number]  Gross per 24 hour  Intake                0 ml  Output             3975 ml  Net            -3975 ml     Wt Readings from Last 3 Encounters:  10/30/16 69.4 kg (153 lb)  10/26/16 69.8 kg (153 lb 12.8 oz)  10/18/16 72.1 kg (159 lb)     Exam  General: Alert and oriented x 3, NAD, Frail, hard of hearing  HEENT:    Neck: Supple, no JVD  Cardiovascular: S1 S2 clear, RRR  Respiratory: Decreased breath sounds at the bases  Gastrointestinal: Soft, nontender, nondistended, + bowel sounds  Ext: no cyanosis clubbing or edema  Neuro: no neuro deficits  Skin: No rashes  Psych: Normal affect and demeanor, alert and oriented x3    Data Reviewed:  I have personally reviewed following labs and imaging studies  Micro Results Recent Results (from the past 240 hour(s))  Blood Culture (routine x 2)     Status: None   Collection Time: 10/24/16  5:00 PM  Result Value Ref Range Status   Specimen Description BLOOD LEFT ANTECUBITAL  Final   Special Requests   Final    BOTTLES DRAWN AEROBIC AND ANAEROBIC 5CC BOTH BOTTLES   Culture   Final    NO  GROWTH 5 DAYS Performed at [facility], [address] [place], [place] [address]    Report Status 10/29/2016 FINAL  Final  Blood Culture (routine x 2)     Status: None   Collection Time: 10/24/16  5:10 PM  Result Value Ref Range Status  Specimen Description BLOOD LEFT FOREARM  Final   Special Requests   Final    BOTTLES DRAWN AEROBIC AND ANAEROBIC 5CC BOTH BOTTLES   Culture   Final    NO GROWTH 5 DAYS Performed at [facility], [address], [place], [place] [address]    Report Status 10/29/2016 FINAL  Final  Urine culture     Status: Abnormal   Collection Time: 10/24/16  5:47 PM  Result Value Ref Range Status   Specimen Description URINE, CLEAN CATCH  Final   Special Requests NONE  Final   Culture >=100,000 COLONIES/mL ESCHERICHIA COLI (A)  Final   Report Status 10/27/2016 FINAL  Final   Organism ID, Bacteria ESCHERICHIA COLI (A)  Final      Susceptibility   Escherichia coli - MIC*    AMPICILLIN 8 SENSITIVE Sensitive     CEFAZOLIN <=4 SENSITIVE Sensitive     CEFTRIAXONE <=1 SENSITIVE Sensitive     CIPROFLOXACIN <=0.25 SENSITIVE Sensitive     GENTAMICIN <=1 SENSITIVE Sensitive     IMIPENEM <=0.25 SENSITIVE Sensitive     NITROFURANTOIN <=16 SENSITIVE Sensitive     TRIMETH/SULFA <=20 SENSITIVE Sensitive     AMPICILLIN/SULBACTAM 4 SENSITIVE Sensitive     PIP/TAZO <=4 SENSITIVE Sensitive     Extended ESBL NEGATIVE Sensitive     * >=100,000 COLONIES/mL ESCHERICHIA COLI  MRSA PCR Screening     Status: None   Collection Time: 10/24/16 11:09 PM  Result Value Ref Range Status   MRSA by PCR NEGATIVE NEGATIVE Final    Comment:        The GeneXpert MRSA Assay (FDA approved for NASAL specimens only), is one component of a comprehensive MRSA colonization surveillance program. It is not intended to diagnose MRSA infection nor to guide or monitor treatment for MRSA infections.   Respiratory Panel by PCR     Status: None   Collection Time: 10/25/16 12:44 AM    Result Value Ref Range Status   Adenovirus NOT DETECTED NOT DETECTED Final   Coronavirus 229E NOT DETECTED NOT DETECTED Final   Coronavirus HKU1 NOT DETECTED NOT DETECTED Final   Coronavirus NL63 NOT DETECTED NOT DETECTED Final   Coronavirus OC43 NOT DETECTED NOT DETECTED Final   Metapneumovirus NOT DETECTED NOT DETECTED Final   Rhinovirus / Enterovirus NOT DETECTED NOT DETECTED Final   Influenza A NOT DETECTED NOT DETECTED Final   Influenza B NOT DETECTED NOT DETECTED Final   Parainfluenza Virus 1 NOT DETECTED NOT DETECTED Final   Parainfluenza Virus 2 NOT DETECTED NOT DETECTED Final   Parainfluenza Virus 3 NOT DETECTED NOT DETECTED Final   Parainfluenza Virus 4 NOT DETECTED NOT DETECTED Final   Respiratory Syncytial Virus NOT DETECTED NOT DETECTED Final   Bordetella pertussis NOT DETECTED NOT DETECTED Final   Chlamydophila pneumoniae NOT DETECTED NOT DETECTED Final   Mycoplasma pneumoniae NOT DETECTED NOT DETECTED Final  Blood culture (routine x 2)     Status: None (Preliminary result)   Collection Time: 10/31/16  2:05 AM  Result Value Ref Range Status   Specimen Description BLOOD RIGHT FOREARM  Final   Special Requests BOTTLES DRAWN AEROBIC AND ANAEROBIC 5CC EACH  Final   Culture   Final    NO GROWTH 2 DAYS Performed at [facility], [address] [place], [place] [address]    Report Status PENDING  Incomplete  Blood culture (routine x 2)     Status: None (Preliminary result)   Collection Time:  10/31/16  2:10 AM  Result Value Ref Range Status   Specimen Description BLOOD LEFT FOREARM  Final   Special Requests BOTTLES DRAWN AEROBIC AND ANAEROBIC 5CC EACH  Final   Culture   Final    NO GROWTH 2 DAYS Performed at [facility], [place] [address] [place], [place] [address]    Report Status PENDING  Incomplete    Radiology Reports Dg Chest 2 View  Result Date: 10/31/2016 CLINICAL DATA:  Fever last night. Recent released from hospital for pneumonia 5 days ago.  Former smoker. EXAM: CHEST  2 VIEW COMPARISON:  10/24/2016 FINDINGS: Increasing linear opacities in the left lung base may represent developing pneumonia. Emphysematous changes in the lungs with scattered fibrosis. No blunting of costophrenic angles. No pneumothorax. Calcified and tortuous aorta. Normal heart size and pulmonary vascularity. Degenerative changes in the spine. IMPRESSION: Increasing linear infiltration in the left lung base may indicate developing pneumonia. Electronically Signed   By: Lucienne Capers M.D.   On: 10/31/2016 01:00   Dg Chest Port 1 View  Result Date: 10/24/2016 CLINICAL DATA:  Sepsis EXAM: PORTABLE CHEST 1 VIEW COMPARISON:  05/30/2016 FINDINGS: Diffuse interstitial prominence, new. This could represent an infectious pneumonitis, but noninfectious inflammatory or fibrotic processes are also possible. Interstitial edema appears less likely, given the lack of accompanying vascular changes. No airspace consolidation. No effusions. Hilar, mediastinal and cardiac contours are unremarkable. IMPRESSION: Diffuse interstitial prominence, possibly infectious or inflammatory. Electronically Signed   By: Andreas Newport M.D.   On: 10/24/2016 18:40    Lab Data:  CBC:  Recent Labs Lab 10/27/16 0442 10/31/16 0037 11/01/16 0523  WBC 6.9 10.2 8.5  NEUTROABS  --  5.5  --   HGB 10.9* 11.3* 11.2*  HCT 32.1* 33.4* 33.8*  MCV 87.7 90.0 88.5  PLT 199 261 0000000   Basic Metabolic Panel:  Recent Labs Lab 10/27/16 0442 10/31/16 0037 11/01/16 0523  NA 134* 133* 133*  K 4.2 4.2 4.2  CL 103 101 100*  CO2 23 23 22   GLUCOSE 165* 155* 169*  BUN 13 17 15   CREATININE 1.17 1.01 1.15  CALCIUM 9.1 9.3 9.2   GFR: Estimated Creatinine Clearance: 38.5 mL/min (by C-G formula based on SCr of 1.15 mg/dL). Liver Function Tests: No results for input(s): AST, ALT, ALKPHOS, BILITOT, PROT, ALBUMIN in the last 168 hours. No results for input(s): LIPASE, AMYLASE in the last 168 hours. No  results for input(s): AMMONIA in the last 168 hours. Coagulation Profile: No results for input(s): INR, PROTIME in the last 168 hours. Cardiac Enzymes: No results for input(s): CKTOTAL, CKMB, CKMBINDEX, TROPONINI in the last 168 hours. BNP (last 3 results) No results for input(s): PROBNP in the last 8760 hours. HbA1C: No results for input(s): HGBA1C in the last 72 hours. CBG:  Recent Labs Lab 11/01/16 0809 11/01/16 1239 11/01/16 1722 11/01/16 2116 11/02/16 0809  GLUCAP 155* 182* 181* 144* 170*   Lipid Profile: No results for input(s): CHOL, HDL, LDLCALC, TRIG, CHOLHDL, LDLDIRECT in the last 72 hours. Thyroid Function Tests: No results for input(s): TSH, T4TOTAL, FREET4, T3FREE, THYROIDAB in the last 72 hours. Anemia Panel: No results for input(s): VITAMINB12, FOLATE, FERRITIN, TIBC, IRON, RETICCTPCT in the last 72 hours. Urine analysis:    Component Value Date/Time   COLORURINE YELLOW 10/31/2016 0037   APPEARANCEUR CLEAR 10/31/2016 0037   LABSPEC 1.013 10/31/2016 0037   PHURINE 6.5 10/31/2016 0037   GLUCOSEU NEGATIVE 10/31/2016 0037   GLUCOSEU NEGATIVE 10/17/2016 1533  HGBUR NEGATIVE 10/31/2016 0037   BILIRUBINUR NEGATIVE 10/31/2016 0037   KETONESUR NEGATIVE 10/31/2016 0037   PROTEINUR NEGATIVE 10/31/2016 0037   UROBILINOGEN 0.2 10/17/2016 1533   NITRITE NEGATIVE 10/31/2016 0037   LEUKOCYTESUR TRACE (A) 10/31/2016 0037     RAI,RIPUDEEP M.D. [facility] 11/02/2016, 12:20 PM  Pager: [phone] Between 7am to 7pm - call Pager - [phone]  After 7pm go to [email or web address] - password [email or web address]  Call night coverage person covering after 7pm

## 2016-11-03 DIAGNOSIS — J189 Pneumonia, unspecified organism: Secondary | ICD-10-CM

## 2016-11-03 LAB — GLUCOSE, CAPILLARY
GLUCOSE-CAPILLARY: 170 mg/dL — AB (ref 65–99)
Glucose-Capillary: 192 mg/dL — ABNORMAL HIGH (ref 65–99)

## 2016-11-03 MED ORDER — LEVOFLOXACIN 750 MG PO TABS
750.0000 mg | ORAL_TABLET | ORAL | Status: DC
  Administered 2016-11-03: 750 mg via ORAL
  Filled 2016-11-03: qty 1

## 2016-11-03 MED ORDER — LEVOFLOXACIN 750 MG PO TABS: 750.0000 mg | ORAL_TABLET | ORAL | 0 refills | Status: AC

## 2016-11-03 NOTE — Progress Notes (Signed)
Pharmacy Antibiotic Note  Gerald Holt is a 83 y.o. male admitted on 10/30/2016 with pneumonia.  Pharmacy has been consulted to change vancomycin/cefepime to po Levaquin for HCAP.  Plan: Levaquin 750mg  po q48h Will continue to f/u SCr and clinical condition  Height: 5\' 6"  (167.6 cm) Weight: 153 lb (69.4 kg) IBW/kg (Calculated) : 63.8  Temp (24hrs), Avg:98.4 F (36.9 C), Min:97.9 F (36.6 C), Max:99.3 F (37.4 C)   Recent Labs Lab 10/31/16 0037 11/01/16 0523  WBC 10.2 8.5  CREATININE 1.01 1.15    Estimated Creatinine Clearance: 37.8 mL/min (by C-G formula based on SCr of 1.15 mg/dL).    Allergies  Allergen Reactions  . Flomax [Tamsulosin Hcl] Other (See Comments)    Hypotension  . Shellfish Allergy Anaphylaxis  . Aspirin-Dipyridamole Er Other (See Comments)    Headaches   . Other Other (See Comments)    Blood Thinner given in Rehab gave H/As - Not Coumadin/Headaches      Antimicrobials this admission: 2/11 Levaquin >> 2/8 vanc >> 2/11 2/8 Zosyn x 1  Dose adjustments this admission:  Microbiology results: 2/8 BCx x2:   Thank you for allowing [name] to participate in this patients care.  Sherlon Handing, PharmD, BCPS Clinical pharmacist, pager [phone] 11/03/2016 7:51 AM

## 2016-11-03 NOTE — Care Management Note (Signed)
Case Management Note  Patient Details  Name: Gerald Holt MRN: [id number] Date of Birth: Jun 20, 1924  Subjective/Objective: 83 y.o. M admitted 10/30/2016 with PNE. He and spouse live with her sister who is Therapist, sports. Active with [facility] pta and I have LM with answering service to resume. [phone]. May need to fax orders and F2F. Have ordered RW via [facility] to be delivered prior to discharge yet pt denies need for 3n1 at home. Attempted to call wife on both home and cell number without success as pt is EXTREMELY HOH.                     Action/Plan: No additional CM needs at present.    Expected Discharge Date:  11/03/16               Expected Discharge Plan:  [place]  In-House Referral:  NA  Discharge planning Services  CM Consult  Post Acute Care Choice:  Durable Medical Equipment Choice offered to:  Patient  DME Arranged:  [facility] rolling (Refused 3n1) DME Agency:  [place]:  RN [facility] Agency:   ([place] [phone])  Status of Service:  In process, will continue to follow  If discussed at Long Length of Stay Meetings, dates discussed:    Additional Comments:  Delrae Sawyers, RN 11/03/2016, 10:57 AM

## 2016-11-03 NOTE — Care Management Note (Signed)
Case Management Note  Patient Details  Name: Demontrez Ardis MRN: [id number] Date of Birth: 12-Sep-1924  Subjective/Objective: MD spoke to family and confirmed that they already have 3n1. CM has spoken with Melissa with [facility] and confirmed services pta and resumption of care. Have faxed F2F, resumption of HHPT orders and facesheet to [phone] with confirmation as requested.                   Action/Plan: CM will sign off for now but will be available should additional discharge needs arise or disposition change.    Expected Discharge Date:  11/03/16               Expected Discharge Plan:  [place]  In-House Referral:  NA  Discharge planning Services  CM Consult  Post Acute Care Choice:  Durable Medical Equipment Choice offered to:  Patient  DME Arranged:  [facility] rolling (Refused 3n1) DME Agency:  [place]:  RN [facility] Agency:   ([place] [phone])  Status of Service:  In process, will continue to follow  If discussed at Long Length of Stay Meetings, dates discussed:    Additional Comments:  Delrae Sawyers, RN 11/03/2016, 12:34 PM

## 2016-11-04 ENCOUNTER — Telehealth: Payer: Self-pay | Admitting: Physician Assistant

## 2016-11-04 NOTE — Discharge Summary (Signed)
[facility] Discharge Summary   [name] [id number]   PCP: Leeanne Rio, PA-C DOB: 1924-05-26   Date of admission: 10/30/2016   Date of discharge: 11/03/2016   Discharge Diagnoses:  Principal Problem:   HCAP (healthcare-associated pneumonia) Active Problems:   Diabetes mellitus type II, uncontrolled ([place])   Paroxysmal atrial fibrillation ([place])   Hyperlipidemia   Gastroesophageal reflux disease without esophagitis   Hypothyroidism   Urinary retention   Diabetes mellitus with complication ([place])   Lobar pneumonia ([place])   Admitted From: home Disposition:  Home with home heath  Recommendations for Outpatient Follow-up:  1. Follow-up with PCP in one week   Follow-up Information    Leeanne Rio, PA-C. Schedule an appointment as soon as possible for a visit in 1 week(s).   Specialty:  Family Medicine Contact information: [name] [place] [address] [phone]        [place] Follow up.   Specialty:  Home Health Services Why:  Home Health will be resumed as before. You should receive a telephone call from arepresentative within 24-48 hours of discharge. I have contacted the agency to Inform them of discharge.  Contact information: [address] [place] [address] [phone]        [facility] Follow up.   Why:  RW will be provided to your room prior to discharge. Olease call above number if you have any issues after discharge.  Contact information: [address] [address] [phone]          Diet recommendation: Cardiac diet  Activity: The patient is advised to gradually reintroduce usual activities.  Discharge Condition: good  Code Status: DNR/DNI  History of present illness: As per the H and P dictated on admission, "Gerald Holt is a delightful 83 y.o. male with medical history significant for diabetes, CVA, frequent UTIs  secondary to self cath, A. Fib presents to 5 W. room 16 from [facility] chief complaint generalized weakness and fever. Initial evaluation concerning for pneumonia  Information is obtained from the patient and his caregiver who is at the bedside. He was discharged 4 days ago after a hospitalization for sepsis secondary to urinary tract infection. Caregiver said he's been doing well since that time until yesterday when he complained of being "more tired than usual". Associated symptoms include decreased appetite decreased oral intake is decreased as well. Yesterday evening he developed a dry cough. He denies headache dizziness syncope or near-syncope. He denies chest pain palpitation shortness of breath nausea vomiting. He does endorse loose stools but attributes that to the antibiotic he's been taking for his urinary tract infection. He denies lower extremity edema or orthopnea. Of note he had a chest x-ray during his last hospitalization feeling inflammatory vs infectious changes. "  Hospital Course:   Summary of his active problems in the hospital is as following. Healthcare associated pneumonia.  - Chest x-ray showed increasing linear infiltration in the left lung base may indicate developing pneumonia. -  Blood cultures negative so far. Urine strep antigen negative, urine cultures negative - Treated with IV vancomycin, cefepime-> transition to oral Levaquin - PT, OT evaluation recommended home health PT OT  Active problems Diabetes mellitus type 2 on oral hypoglycemics. Continue home Regimen on discharge  Paroxysmal atrial fibrillation - Currently Rate controlled.  - Not on anticoagulation. chadvasc score 4. Home medications include Lopressor Resuming home Lopressor.  Essential hypertension. Discontinuing the lisinopril at the time of the discharge due to soft blood pressure.  Hypothyroidism -continueSynthroid  Chronic Urinary retention. -  performsintermittent  caths. Patient and family wants to continue intermittent catheterization at home. Patient had a Foley catheter placed at the other facility prior to admission, this was removed at the time of discharge.  All other chronic medical condition were stable during the hospitalization.  Patient was seen by physical therapy, who recommended home health, which was arranged by Education officer, museum and case Freight forwarder. On the day of the discharge the patient's vitals were stable, and no other acute medical condition were reported by patient. the patient was felt safe to be discharge at home with home health.  Procedures and Results:  none   Consultations:  none  DISCHARGE MEDICATION: Discharge Medication List as of 11/03/2016  2:09 PM    START taking these medications   Details  levofloxacin (LEVAQUIN) 750 MG tablet Take 1 tablet (750 mg total) by mouth every other day., Starting Sun 11/03/2016, Until Thu 11/07/2016, Normal      CONTINUE these medications which have NOT CHANGED   Details  aspirin 81 MG tablet Take 1 tablet (81 mg total) by mouth daily., Starting Thu 06/22/2015, OTC    Calcium Carbonate Antacid (ALKA-SELTZER ANTACID PO) Take 2 tablets by mouth as needed (for indegestion). , Historical Med    cyanocobalamin (,VITAMIN B-12,) 1000 MCG/ML injection Inject 1000 mcg intramuscular daily for 7 days, then once a week for 4 weeks, then once a month for a year, Normal    docusate sodium (COLACE) 100 MG capsule Take 100-200 mg by mouth See admin instructions. Take 100 mg by mouth daily for 2 days, then on every third day take 200 mg by mouth daily. Alternate 100 mg and 200 mg, Historical Med    Eyelid Cleansers (STERILID EX) Apply 1 application topically See admin instructions. Eye Wash: Once Daily , Historical Med    glipiZIDE (GLUCOTROL XL) 2.5 MG 24 hr tablet Take 1 tablet (2.5 mg total) by mouth daily with breakfast., Starting Thu 10/17/2016, Normal    levothyroxine (SYNTHROID, LEVOTHROID) 50 MCG  tablet Take 1 tablet (50 mcg total) by mouth daily., Starting Tue 04/09/2016, Normal    lovastatin (MEVACOR) 10 MG tablet Take 5 mg by mouth at bedtime. , Starting Wed 08/21/2016, Historical Med    metFORMIN (GLUCOPHAGE-XR) 500 MG 24 hr tablet Take 2 tablets with breakfast and at bedtime, Normal    metoprolol tartrate (LOPRESSOR) 25 MG tablet Take 0.5 tablets (12.5 mg total) by mouth 2 (two) times daily., Starting Tue 04/09/2016, Normal    Miconazole Nitrate (TRIPLE PASTE AF) 2 % OINT Apply 1 application topically once a week. , Historical Med    Omega-3 Fatty Acids (SB OMEGA-3 FISH OIL) 1000 MG CAPS Take 1,000 mg by mouth 2 (two) times daily. Reported on 10/06/2015, Historical Med    OVER THE COUNTER MEDICATION Take 1 capsule by mouth 2 (two) times daily. OTC Focus Select, Historical Med    triamcinolone cream (KENALOG) 0.1 % Apply 1 application topically as needed (for itching). , Starting Thu 02/01/2016, Historical Med      STOP taking these medications     ciprofloxacin (CIPRO) 500 MG tablet      lisinopril (PRINIVIL,ZESTRIL) 2.5 MG tablet        Allergies  Allergen Reactions  . Flomax [Tamsulosin Hcl] Other (See Comments)    Hypotension  . Shellfish Allergy Anaphylaxis  . Aspirin-Dipyridamole Er Other (See  Comments)    Headaches   . Other Other (See Comments)    Blood Thinner given in Rehab gave H/As - Not Coumadin/Headaches     Discharge Instructions    Diet - low sodium heart healthy    Complete by:  As directed    Increase activity slowly    Complete by:  As directed      Discharge Exam: Filed Weights   10/30/16 2331  Weight: 69.4 kg (153 lb)   Vitals:   11/02/16 2122 11/03/16 0610  BP: (!) 116/57 118/73  Pulse: 87 78  Resp: 18 18  Temp: 99.3 F (37.4 C) 98 F (36.7 C)   General: Appear in no distress, no Rash; Oral Mucosa moist Cardiovascular: S1 and S2 Present, aortic systolic Murmur, no JVD Respiratory: Bilateral Air entry present and basal  Crackles, no wheezes Abdomen: Bowel Sound present, Soft and no tenderness Extremities: no Pedal edema, no calf tenderness Neurology: Grossly no focal neuro deficit.  The results of significant diagnostics from this hospitalization (including imaging, microbiology, ancillary and laboratory) are listed below for reference.    Significant Diagnostic Studies: Dg Chest 2 View  Result Date: 10/31/2016 CLINICAL DATA:  Fever last night. Recent released from hospital for pneumonia 5 days ago. Former smoker. EXAM: CHEST  2 VIEW COMPARISON:  10/24/2016 FINDINGS: Increasing linear opacities in the left lung base may represent developing pneumonia. Emphysematous changes in the lungs with scattered fibrosis. No blunting of costophrenic angles. No pneumothorax. Calcified and tortuous aorta. Normal heart size and pulmonary vascularity. Degenerative changes in the spine. IMPRESSION: Increasing linear infiltration in the left lung base may indicate developing pneumonia. Electronically Signed   By: Lucienne Capers M.D.   On: 10/31/2016 01:00   Dg Chest Port 1 View  Result Date: 10/24/2016 CLINICAL DATA:  Sepsis EXAM: PORTABLE CHEST 1 VIEW COMPARISON:  05/30/2016 FINDINGS: Diffuse interstitial prominence, new. This could represent an infectious pneumonitis, but noninfectious inflammatory or fibrotic processes are also possible. Interstitial edema appears less likely, given the lack of accompanying vascular changes. No airspace consolidation. No effusions. Hilar, mediastinal and cardiac contours are unremarkable. IMPRESSION: Diffuse interstitial prominence, possibly infectious or inflammatory. Electronically Signed   By: Andreas Newport M.D.   On: 10/24/2016 18:40    Microbiology: Recent Results (from the past 240 hour(s))  Blood culture (routine x 2)     Status: None (Preliminary result)   Collection Time: 10/31/16  2:05 AM  Result Value Ref Range Status   Specimen Description BLOOD RIGHT FOREARM  Final    Special Requests BOTTLES DRAWN AEROBIC AND ANAEROBIC 5CC EACH  Final   Culture   Final    NO GROWTH 4 DAYS Performed at [facility], [address], [place], [place] [address]    Report Status PENDING  Incomplete  Blood culture (routine x 2)     Status: None (Preliminary result)   Collection Time: 10/31/16  2:10 AM  Result Value Ref Range Status   Specimen Description BLOOD LEFT FOREARM  Final   Special Requests BOTTLES DRAWN AEROBIC AND ANAEROBIC 5CC EACH  Final   Culture   Final    NO GROWTH 4 DAYS Performed at [facility], [place] [address], [place], [place] [address]    Report Status PENDING  Incomplete     Labs: CBC:  Recent Labs Lab 10/31/16 0037 11/01/16 0523  WBC 10.2 8.5  NEUTROABS 5.5  --   HGB 11.3* 11.2*  HCT 33.4* 33.8*  MCV 90.0  88.5  PLT 261 0000000   Basic Metabolic Panel:  Recent Labs Lab 10/31/16 0037 11/01/16 0523  NA 133* 133*  K 4.2 4.2  CL 101 100*  CO2 23 22  GLUCOSE 155* 169*  BUN 17 15  CREATININE 1.01 1.15  CALCIUM 9.3 9.2   CBG:  Recent Labs Lab 11/02/16 1243 11/02/16 1713 11/02/16 2120 11/03/16 0742 11/03/16 1231  GLUCAP 181* 139* 184* 192* 170*   Time spent: 40 minutes  Signed:  Cheryl Chay  [facility] 11/03/2016 , 3:42 PM

## 2016-11-04 NOTE — Telephone Encounter (Signed)
Ok to give verbal order for this.  Patient also needs to be scheduled for TCM follow-up.

## 2016-11-04 NOTE — Telephone Encounter (Signed)
Patient released from hospital on 02/11. He will need a new order to have physical therapy and nursing activated again.

## 2016-11-04 NOTE — Telephone Encounter (Signed)
LM requesting call back to complete TCM and schedule hospital follow up.  [place], orders already received.

## 2016-11-05 LAB — CULTURE, BLOOD (ROUTINE X 2)
CULTURE: NO GROWTH
CULTURE: NO GROWTH

## 2016-11-05 NOTE — Telephone Encounter (Signed)
LM requesting call back to complete TCM and schedule hospital f/u.  

## 2016-11-06 DIAGNOSIS — F0390 Unspecified dementia without behavioral disturbance: Secondary | ICD-10-CM | POA: Diagnosis not present

## 2016-11-06 DIAGNOSIS — I69398 Other sequelae of cerebral infarction: Secondary | ICD-10-CM | POA: Diagnosis not present

## 2016-11-06 DIAGNOSIS — I69354 Hemiplegia and hemiparesis following cerebral infarction affecting left non-dominant side: Secondary | ICD-10-CM | POA: Diagnosis not present

## 2016-11-06 DIAGNOSIS — Z9181 History of falling: Secondary | ICD-10-CM | POA: Diagnosis not present

## 2016-11-06 DIAGNOSIS — E1165 Type 2 diabetes mellitus with hyperglycemia: Secondary | ICD-10-CM | POA: Diagnosis not present

## 2016-11-06 DIAGNOSIS — N390 Urinary tract infection, site not specified: Secondary | ICD-10-CM | POA: Diagnosis not present

## 2016-11-06 DIAGNOSIS — R2689 Other abnormalities of gait and mobility: Secondary | ICD-10-CM | POA: Diagnosis not present

## 2016-11-06 NOTE — Telephone Encounter (Signed)
Spoke with wife.   Transition Care Management Follow-up Telephone Call   Date discharged? 11/03/16   How have you been since you were released from the hospital? "weak, but better than the last time he was discharged"   Do you understand why you were in the hospital? yes   Do you understand the discharge instructions? yes   Where were you discharged to? Home.    Items Reviewed:  Medications reviewed: no, wife states only change is addition of one antibiotic, 1 dose left of treatment.   Allergies reviewed: yes  Dietary changes reviewed: yes  Referrals reviewed: yes   Functional Questionnaire:   Activities of Daily Living (ADLs):   He states they are independent in the following: none. States they require assistance with the following: ambulation, bathing and hygiene, feeding, continence, grooming, toileting and dressing. Wife states patient requires assistance from home health aide for ADL's.    Any transportation issues/concerns?: no   Any patient concerns? no   Confirmed importance and date/time of follow-up visits scheduled yes  Provider Appointment booked with PCP on Monday, 11/11/16 @ 11:30. Wife and patient requested to schedule appointment for next week due to weakness.   Confirmed with patient if condition begins to worsen call PCP or go to the ER.  Patient was given the office number and encouraged to call back with question or concerns.  : yes

## 2016-11-08 ENCOUNTER — Telehealth: Payer: Self-pay | Admitting: Physician Assistant

## 2016-11-08 NOTE — Telephone Encounter (Signed)
Called and verbal ok given per PCP 

## 2016-11-08 NOTE — Telephone Encounter (Signed)
Gerald Holt is calling to get verbals for PT She did a resumption 02/14 after the patient was discharged from the hospital. She wants 2 week 4 beginning next week for strengthening, balance and gait training.

## 2016-11-11 ENCOUNTER — Encounter: Payer: Self-pay | Admitting: Physician Assistant

## 2016-11-11 ENCOUNTER — Ambulatory Visit (INDEPENDENT_AMBULATORY_CARE_PROVIDER_SITE_OTHER): Payer: Medicare Other | Admitting: Physician Assistant

## 2016-11-11 VITALS — BP 112/60 | HR 77 | Temp 97.6°F | Resp 14 | Ht 66.0 in | Wt 153.0 lb

## 2016-11-11 DIAGNOSIS — J189 Pneumonia, unspecified organism: Secondary | ICD-10-CM | POA: Diagnosis not present

## 2016-11-11 DIAGNOSIS — R339 Retention of urine, unspecified: Secondary | ICD-10-CM | POA: Diagnosis not present

## 2016-11-11 DIAGNOSIS — N289 Disorder of kidney and ureter, unspecified: Secondary | ICD-10-CM

## 2016-11-11 DIAGNOSIS — E118 Type 2 diabetes mellitus with unspecified complications: Secondary | ICD-10-CM | POA: Diagnosis not present

## 2016-11-11 LAB — BASIC METABOLIC PANEL
BUN: 25 mg/dL — ABNORMAL HIGH (ref 6–23)
CALCIUM: 10 mg/dL (ref 8.4–10.5)
CO2: 25 mEq/L (ref 19–32)
Chloride: 98 mEq/L (ref 96–112)
Creatinine, Ser: 1.15 mg/dL (ref 0.40–1.50)
GFR: 63.35 mL/min (ref >60.00)
Glucose, Bld: 122 mg/dL — ABNORMAL HIGH (ref 70–99)
POTASSIUM: 5.2 meq/L — AB (ref 3.5–5.1)
SODIUM: 136 meq/L (ref 135–145)

## 2016-11-11 LAB — CBC
HEMATOCRIT: 38.5 % — AB (ref 39.0–52.0)
Hemoglobin: 12.7 g/dL — ABNORMAL LOW (ref 13.0–17.0)
MCHC: 32.9 g/dL (ref 30.0–36.0)
MCV: 89.9 fl (ref 78.0–100.0)
Platelets: 323 10*3/uL (ref 150.0–400.0)
RBC: 4.28 Mil/uL (ref 4.22–5.81)
RDW: 16.1 % — AB (ref 11.5–15.5)
WBC: 11.4 10*3/uL — AB (ref 4.0–10.5)

## 2016-11-11 NOTE — Progress Notes (Signed)
Patient presents to clinic today for TCM/Hospital Follow-up visit. Patient presented to [place] on 10/30/16 with complaints of generalized weakness and fever. Was recently hospitalized for UTI and sepsis. Had been doing well up until the day before returning to ER. Patient was noting significant fatigue with anorexia and decreased fluid intake. Also with dry cough and fever. CXR obtained in ER was concerning for pneumonia. ABX started and patient admitted for further assessment and management. Patient was admitted to Telemetry. Blood cultures, urine cultures and strep pneumo urine antigen obtained. All negative. Home diabetes medications stopped and SSI used during hospitalization. Patient transitioned form IV Vancomycin and Cefepime to oral Levaquin on discharge. Home PT/OT re-set up. Lopressor and 81 mg ASA restarted. Lisinopril was discontinued at discharge.  Patient to follow-up with Urology and PCP within 1 week.  Since discharge, patient and family endorse patient doing well overall. Note he has been slowly getting his energy back. Is hydrating well. Denies recurrence of fever, cough or chest congestion. Has an incentive spirometer that he has been using. Has little appetite but is eating. Wife is giving Glucerna shakes 1-2 x day. Is taking all medications as directed. Has fasting sugar log to review.  Past Medical History:  Diagnosis Date  . A-fib ([place])   . Cataracts, bilateral   . Chronic dermatitis   . Diabetes type 2, controlled ([place]) 2000  . History of chicken pox   . Mumps    Adult  . Retinopathy   . Shingles   . Stroke [facility]) Nov 2011  . Undescended testicle, unilateral    Right  . Whooping cough     Current Outpatient Prescriptions on File Prior to Visit  Medication Sig Dispense Refill  . aspirin 81 MG tablet Take 1 tablet (81 mg total) by mouth daily.    . Calcium Carbonate Antacid (ALKA-SELTZER ANTACID PO) Take 2 tablets by mouth as needed (for indegestion).     .  cyanocobalamin (,VITAMIN B-12,) 1000 MCG/ML injection Inject 1000 mcg intramuscular daily for 7 days, then once a week for 4 weeks, then once a month for a year (Patient taking differently: Inject 1,000 mcg into the muscle every 30 (thirty) days. ) 25 mL 0  . docusate sodium (COLACE) 100 MG capsule Take 100-200 mg by mouth See admin instructions. Take 100 mg by mouth daily for 2 days, then on every third day take 200 mg by mouth daily. Alternate 100 mg and 200 mg    . Eyelid Cleansers (STERILID EX) Apply 1 application topically See admin instructions. Eye Wash: Once Daily     . glipiZIDE (GLUCOTROL XL) 2.5 MG 24 hr tablet Take 1 tablet (2.5 mg total) by mouth daily with breakfast. 30 tablet 5  . levothyroxine (SYNTHROID, LEVOTHROID) 50 MCG tablet Take 1 tablet (50 mcg total) by mouth daily. 90 tablet 3  . metFORMIN (GLUCOPHAGE-XR) 500 MG 24 hr tablet Take 2 tablets with breakfast and at bedtime (Patient taking differently: Take 1,000 mg by mouth 2 (two) times daily. ) 360 tablet 1  . metoprolol tartrate (LOPRESSOR) 25 MG tablet Take 0.5 tablets (12.5 mg total) by mouth 2 (two) times daily. 180 tablet 1  . Miconazole Nitrate (TRIPLE PASTE AF) 2 % OINT Apply 1 application topically once a week.     . Omega-3 Fatty Acids (SB OMEGA-3 FISH OIL) 1000 MG CAPS Take 1,000 mg by mouth 2 (two) times daily. Reported on 10/06/2015    . OVER THE COUNTER MEDICATION Take 1 capsule  by mouth 2 (two) times daily. OTC Focus Select    . triamcinolone cream (KENALOG) 0.1 % Apply 1 application topically as needed (for itching).     Marland Kitchen lovastatin (MEVACOR) 10 MG tablet Take 5 mg by mouth at bedtime.      No current facility-administered medications on file prior to visit.     Allergies  Allergen Reactions  . Flomax [Tamsulosin Hcl] Other (See Comments)    Hypotension  . Shellfish Allergy Anaphylaxis  . Aspirin-Dipyridamole Er Other (See Comments)    Headaches   . Other Other (See Comments)    Blood Thinner given in  Rehab gave H/As - Not Coumadin/Headaches      Family History  Problem Relation Age of Onset  . Pneumonia Mother 45    Deceased  . GI Bleed Father 59    Deceased - Ulcers  . Diabetes Cousin   . Diabetes Brother   . Cancer Paternal Aunt     Social History   Social History  . Marital status: Married    Spouse name: N/A  . Number of children: N/A  . Years of education: N/A   Social History Main Topics  . Smoking status: Former Research scientist (life sciences)  . Smokeless tobacco: Never Used     Comment: Hasn't smoked since age 88  . Alcohol use No  . Drug use: No  . Sexual activity: Not Asked   Other Topics Concern  . None   Social History Narrative   Lives with wife in a one story home.  Has 1 child.  Retired Armed forces technical officer.  Education: Oceanographer.     Review of Systems - See HPI.  All other ROS are negative.  BP 112/60   Pulse 77   Temp 97.6 F (36.4 C) (Oral)   Resp 14   Ht '5\' 6"'  (1.676 m)   Wt 153 lb (69.4 kg)   SpO2 98%   BMI 24.69 kg/m   Physical Exam  Constitutional: He is oriented to person, place, and time and well-developed, well-nourished, and in no distress.  HENT:  Head: Normocephalic and atraumatic.  Eyes: Conjunctivae are normal.  Neck: Neck supple.  Cardiovascular: Normal rate, regular rhythm, normal heart sounds and intact distal pulses.   Pulmonary/Chest: Effort normal and breath sounds normal. No respiratory distress. He has no wheezes. He has no rales. He exhibits no tenderness.  Neurological: He is alert and oriented to person, place, and time.  Skin: Skin is warm and dry. No rash noted.  Psychiatric: Affect normal.  Vitals reviewed.   Recent Results (from the past 2160 hour(s))  TSH     Status: None   Collection Time: 08/23/16  1:22 PM  Result Value Ref Range   TSH 2.92 0.35 - 4.50 uIU/mL  Lipid panel     Status: Abnormal   Collection Time: 09/13/16 12:21 PM  Result Value Ref Range   Cholesterol 91 0 - 200 mg/dL    Comment: ATP III Classification        Desirable:  < 200 mg/dL               Borderline High:  200 - 239 mg/dL          High:  > = 240 mg/dL   Triglycerides 88.0 0.0 - 149.0 mg/dL    Comment: Normal:  <150 mg/dLBorderline High:  150 - 199 mg/dL   HDL 31.30 (L) >39.00 mg/dL   VLDL 17.6 0.0 - 40.0 mg/dL   LDL Cholesterol 42 0 -  99 mg/dL   Total CHOL/HDL Ratio 3     Comment:                Men          Women1/2 Average Risk     3.4          3.3Average Risk          5.0          4.42X Average Risk          9.6          7.13X Average Risk          15.0          11.0                       NonHDL 59.92     Comment: NOTE:  Non-HDL goal should be 30 mg/dL higher than patient's LDL goal (i.e. LDL goal of < 70 mg/dL, would have non-HDL goal of < 100 mg/dL)  Comp Met (CMET)     Status: Abnormal   Collection Time: 09/13/16 12:21 PM  Result Value Ref Range   Sodium 136 135 - 145 mEq/L   Potassium 5.4 (H) 3.5 - 5.1 mEq/L   Chloride 100 96 - 112 mEq/L   CO2 28 19 - 32 mEq/L   Glucose, Bld 133 (H) 70 - 99 mg/dL   BUN 20 6 - 23 mg/dL   Creatinine, Ser 1.13 0.40 - 1.50 mg/dL   Total Bilirubin 0.3 0.2 - 1.2 mg/dL   Alkaline Phosphatase 54 39 - 117 U/L   AST 30 0 - 37 U/L   ALT 39 0 - 53 U/L   Total Protein 6.7 6.0 - 8.3 g/dL   Albumin 4.0 3.5 - 5.2 g/dL   Calcium 9.6 8.4 - 10.5 mg/dL   GFR 64.67 >60.00 mL/min  CBC with Differential     Status: Abnormal   Collection Time: 10/01/16 10:20 PM  Result Value Ref Range   WBC 13.3 (H) 4.0 - 10.5 K/uL   RBC 3.77 (L) 4.22 - 5.81 MIL/uL   Hemoglobin 11.4 (L) 13.0 - 17.0 g/dL   HCT 34.2 (L) 39.0 - 52.0 %   MCV 90.7 78.0 - 100.0 fL   MCH 30.2 26.0 - 34.0 pg   MCHC 33.3 30.0 - 36.0 g/dL   RDW 14.4 11.5 - 15.5 %   Platelets 205 150 - 400 K/uL   Neutrophils Relative % 72 %   Neutro Abs 9.4 (H) 1.7 - 7.7 K/uL   Lymphocytes Relative 15 %   Lymphs Abs 2.0 0.7 - 4.0 K/uL   Monocytes Relative 7 %   Monocytes Absolute 1.0 0.1 - 1.0 K/uL   Eosinophils Relative 6 %   Eosinophils Absolute 0.8 (H) 0.0 -  0.7 K/uL   Basophils Relative 0 %   Basophils Absolute 0.1 0.0 - 0.1 K/uL  Basic metabolic panel     Status: Abnormal   Collection Time: 10/01/16 10:20 PM  Result Value Ref Range   Sodium 134 (L) 135 - 145 mmol/L   Potassium 4.7 3.5 - 5.1 mmol/L   Chloride 98 (L) 101 - 111 mmol/L   CO2 26 22 - 32 mmol/L   Glucose, Bld 144 (H) 65 - 99 mg/dL   BUN 29 (H) 6 - 20 mg/dL   Creatinine, Ser 1.03 0.61 - 1.24 mg/dL   Calcium 9.5 8.9 - 10.3 mg/dL   GFR calc non Af  Amer >60 >60 mL/min   GFR calc Af Amer >60 >60 mL/min    Comment: (NOTE) The eGFR has been calculated using the CKD EPI equation. This calculation has not been validated in all clinical situations. eGFR's persistently <60 mL/min signify possible Chronic Kidney Disease.    Anion gap 10 5 - 15  Urinalysis, Routine w reflex microscopic     Status: Abnormal   Collection Time: 10/01/16 10:45 PM  Result Value Ref Range   Color, Urine RED (A) YELLOW    Comment: BIOCHEMICALS MAY BE AFFECTED BY COLOR   APPearance TURBID (A) CLEAR   Specific Gravity, Urine 1.020 1.005 - 1.030   pH 7.0 5.0 - 8.0   Glucose, UA NEGATIVE NEGATIVE mg/dL   Hgb urine dipstick LARGE (A) NEGATIVE   Bilirubin Urine SMALL (A) NEGATIVE   Ketones, ur 15 (A) NEGATIVE mg/dL   Protein, ur >300 (A) NEGATIVE mg/dL   Nitrite POSITIVE (A) NEGATIVE   Leukocytes, UA LARGE (A) NEGATIVE  Urinalysis, Microscopic (reflex)     Status: Abnormal   Collection Time: 10/01/16 10:45 PM  Result Value Ref Range   RBC / HPF TOO NUMEROUS TO COUNT 0 - 5 RBC/hpf   WBC, UA TOO NUMEROUS TO COUNT 0 - 5 WBC/hpf   Bacteria, UA FEW (A) NONE SEEN   Squamous Epithelial / LPF 0-5 (A) NONE SEEN  Urine culture     Status: Abnormal   Collection Time: 10/01/16 10:45 PM  Result Value Ref Range   Specimen Description URINE, CATHETERIZED    Special Requests NONE    Culture >=100,000 COLONIES/mL ENTEROCOCCUS FAECALIS (A)    Report Status 10/04/2016 FINAL    Organism ID, Bacteria ENTEROCOCCUS  FAECALIS (A)       Susceptibility   Enterococcus faecalis - MIC*    AMPICILLIN <=2 SENSITIVE Sensitive     LEVOFLOXACIN 1 SENSITIVE Sensitive     NITROFURANTOIN <=16 SENSITIVE Sensitive     VANCOMYCIN 1 SENSITIVE Sensitive     * >=100,000 COLONIES/mL ENTEROCOCCUS FAECALIS  POCT Glucose (CBG)     Status: Abnormal   Collection Time: 10/14/16 10:45 AM  Result Value Ref Range   POC Glucose 161 (A) 70 - 99 mg/dl  Hemoglobin A1c     Status: None   Collection Time: 10/14/16 10:51 AM  Result Value Ref Range   Hgb A1c MFr Bld 6.4 4.6 - 6.5 %    Comment: Glycemic Control Guidelines for People with Diabetes:Non Diabetic:  <6%Goal of Therapy: <7%Additional Action Suggested:  >3%   Basic metabolic panel     Status: Abnormal   Collection Time: 10/14/16 10:51 AM  Result Value Ref Range   Sodium 133 (L) 135 - 145 mEq/L   Potassium 5.1 3.5 - 5.1 mEq/L   Chloride 98 96 - 112 mEq/L   CO2 29 19 - 32 mEq/L   Glucose, Bld 175 (H) 70 - 99 mg/dL   BUN 18 6 - 23 mg/dL   Creatinine, Ser 1.02 0.40 - 1.50 mg/dL   Calcium 9.7 8.4 - 10.5 mg/dL   GFR 72.77 >60.00 mL/min  Urine Culture     Status: None   Collection Time: 10/14/16 10:51 AM  Result Value Ref Range   Organism ID, Bacteria NO GROWTH   Urinalysis, Routine w reflex microscopic     Status: Abnormal   Collection Time: 10/17/16  3:33 PM  Result Value Ref Range   Color, Urine YELLOW Yellow;Lt. Yellow   APPearance CLEAR Clear   Specific Gravity, Urine <=  1.005 (A) 1.000 - 1.030   pH 7.0 5.0 - 8.0   Total Protein, Urine NEGATIVE Negative   Urine Glucose NEGATIVE Negative   Ketones, ur NEGATIVE Negative   Bilirubin Urine NEGATIVE Negative   Hgb urine dipstick NEGATIVE Negative   Urobilinogen, UA 0.2 0.0 - 1.0   Leukocytes, UA SMALL (A) Negative   Nitrite NEGATIVE Negative   WBC, UA 3-6/hpf (A) 0-2/hpf   RBC / HPF 0-2/hpf 0-2/hpf   Squamous Epithelial / LPF Rare(0-4/hpf) Rare(0-4/hpf)  Urine Culture     Status: None   Collection Time: 10/17/16   3:33 PM  Result Value Ref Range   Organism ID, Bacteria NO GROWTH   Lipid panel     Status: Abnormal   Collection Time: 10/18/16 11:49 AM  Result Value Ref Range   Cholesterol 139 0 - 200 mg/dL    Comment: ATP III Classification       Desirable:  < 200 mg/dL               Borderline High:  200 - 239 mg/dL          High:  > = 240 mg/dL   Triglycerides 161.0 (H) 0.0 - 149.0 mg/dL    Comment: Normal:  <150 mg/dLBorderline High:  150 - 199 mg/dL   HDL 32.90 (L) >39.00 mg/dL   VLDL 32.2 0.0 - 40.0 mg/dL   LDL Cholesterol 73 0 - 99 mg/dL   Total CHOL/HDL Ratio 4     Comment:                Men          Women1/2 Average Risk     3.4          3.3Average Risk          5.0          4.42X Average Risk          9.6          7.13X Average Risk          15.0          11.0                       NonHDL 105.65     Comment: NOTE:  Non-HDL goal should be 30 mg/dL higher than patient's LDL goal (i.e. LDL goal of < 70 mg/dL, would have non-HDL goal of < 100 mg/dL)  Hepatic function panel     Status: Abnormal   Collection Time: 10/18/16 11:49 AM  Result Value Ref Range   Total Bilirubin 0.5 0.2 - 1.2 mg/dL   Bilirubin, Direct 0.1 0.0 - 0.3 mg/dL   Alkaline Phosphatase 58 39 - 117 U/L   AST 56 (H) 0 - 37 U/L   ALT 77 (H) 0 - 53 U/L   Total Protein 6.9 6.0 - 8.3 g/dL   Albumin 4.0 3.5 - 5.2 g/dL  CBC w/Diff     Status: Abnormal   Collection Time: 10/18/16 11:49 AM  Result Value Ref Range   WBC 8.5 4.0 - 10.5 K/uL   RBC 3.94 (L) 4.22 - 5.81 Mil/uL   Hemoglobin 11.9 (L) 13.0 - 17.0 g/dL   HCT 35.4 (L) 39.0 - 52.0 %   MCV 89.8 78.0 - 100.0 fl   MCHC 33.7 30.0 - 36.0 g/dL   RDW 15.9 (H) 11.5 - 15.5 %   Platelets 211.0 150.0 - 400.0 K/uL   Neutrophils Relative %  59.2 43.0 - 77.0 %   Lymphocytes Relative 17.9 12.0 - 46.0 %   Monocytes Relative 9.1 3.0 - 12.0 %   Eosinophils Relative 13.1 (H) 0.0 - 5.0 %   Basophils Relative 0.7 0.0 - 3.0 %   Neutro Abs 5.1 1.4 - 7.7 K/uL   Lymphs Abs 1.5 0.7 - 4.0 K/uL    Monocytes Absolute 0.8 0.1 - 1.0 K/uL   Eosinophils Absolute 1.1 (H) 0.0 - 0.7 K/uL   Basophils Absolute 0.1 0.0 - 0.1 K/uL  C-reactive protein     Status: None   Collection Time: 10/18/16 11:49 AM  Result Value Ref Range   CRP 0.6 0.5 - 20.0 mg/dL  Blood Culture (routine x 2)     Status: None   Collection Time: 10/24/16  5:00 PM  Result Value Ref Range   Specimen Description BLOOD LEFT ANTECUBITAL    Special Requests      BOTTLES DRAWN AEROBIC AND ANAEROBIC 5CC BOTH BOTTLES   Culture      NO GROWTH 5 DAYS Performed at [facility], [address], [place], [place] [address]    Report Status 10/29/2016 FINAL   CBC with Differential     Status: Abnormal   Collection Time: 10/24/16  5:08 PM  Result Value Ref Range   WBC 12.1 (H) 4.0 - 10.5 K/uL   RBC 3.97 (L) 4.22 - 5.81 MIL/uL   Hemoglobin 12.2 (L) 13.0 - 17.0 g/dL   HCT 35.7 (L) 39.0 - 52.0 %   MCV 89.9 78.0 - 100.0 fL   MCH 30.7 26.0 - 34.0 pg   MCHC 34.2 30.0 - 36.0 g/dL   RDW 15.1 11.5 - 15.5 %   Platelets 202 150 - 400 K/uL   Neutrophils Relative % 72 %   Neutro Abs 8.8 (H) 1.7 - 7.7 K/uL   Lymphocytes Relative 11 %   Lymphs Abs 1.3 0.7 - 4.0 K/uL   Monocytes Relative 12 %   Monocytes Absolute 1.4 (H) 0.1 - 1.0 K/uL   Eosinophils Relative 4 %   Eosinophils Absolute 0.5 0.0 - 0.7 K/uL   Basophils Relative 1 %   Basophils Absolute 0.1 0.0 - 0.1 K/uL  Basic metabolic panel     Status: Abnormal   Collection Time: 10/24/16  5:08 PM  Result Value Ref Range   Sodium 132 (L) 135 - 145 mmol/L   Potassium 5.1 3.5 - 5.1 mmol/L   Chloride 98 (L) 101 - 111 mmol/L   CO2 23 22 - 32 mmol/L   Glucose, Bld 202 (H) 65 - 99 mg/dL   BUN 25 (H) 6 - 20 mg/dL   Creatinine, Ser 1.19 0.61 - 1.24 mg/dL   Calcium 9.4 8.9 - 10.3 mg/dL   GFR calc non Af Amer 52 (L) >60 mL/min   GFR calc Af Amer >60 >60 mL/min    Comment: (NOTE) The eGFR has been calculated using the CKD EPI equation. This calculation has not been validated in  all clinical situations. eGFR's persistently <60 mL/min signify possible Chronic Kidney Disease.    Anion gap 11 5 - 15  Hepatic function panel     Status: Abnormal   Collection Time: 10/24/16  5:08 PM  Result Value Ref Range   Total Protein 7.2 6.5 - 8.1 g/dL   Albumin 3.7 3.5 - 5.0 g/dL   AST 56 (H) 15 - 41 U/L   ALT 78 (H) 17 - 63 U/L   Alkaline Phosphatase 59 38 -  126 U/L   Total Bilirubin 0.6 0.3 - 1.2 mg/dL   Bilirubin, Direct 0.2 0.1 - 0.5 mg/dL   Indirect Bilirubin 0.4 0.3 - 0.9 mg/dL  Blood Culture (routine x 2)     Status: None   Collection Time: 10/24/16  5:10 PM  Result Value Ref Range   Specimen Description BLOOD LEFT FOREARM    Special Requests      BOTTLES DRAWN AEROBIC AND ANAEROBIC 5CC BOTH BOTTLES   Culture      NO GROWTH 5 DAYS Performed at [facility], [address] [place], [place] [address]    Report Status 10/29/2016 FINAL   I-Stat CG4 Lactic Acid, ED     Status: Abnormal   Collection Time: 10/24/16  5:23 PM  Result Value Ref Range   Lactic Acid, Venous 4.38 (HH) 0.5 - 1.9 mmol/L   Comment NOTIFIED PHYSICIAN   Urinalysis, Routine w reflex microscopic     Status: Abnormal   Collection Time: 10/24/16  5:47 PM  Result Value Ref Range   Color, Urine YELLOW YELLOW   APPearance CLOUDY (A) CLEAR   Specific Gravity, Urine 1.013 1.005 - 1.030   pH 6.5 5.0 - 8.0   Glucose, UA NEGATIVE NEGATIVE mg/dL   Hgb urine dipstick SMALL (A) NEGATIVE   Bilirubin Urine NEGATIVE NEGATIVE   Ketones, ur NEGATIVE NEGATIVE mg/dL   Protein, ur 30 (A) NEGATIVE mg/dL   Nitrite NEGATIVE NEGATIVE   Leukocytes, UA LARGE (A) NEGATIVE  Urine culture     Status: Abnormal   Collection Time: 10/24/16  5:47 PM  Result Value Ref Range   Specimen Description URINE, CLEAN CATCH    Special Requests NONE    Culture >=100,000 COLONIES/mL ESCHERICHIA COLI (A)    Report Status 10/27/2016 FINAL    Organism ID, Bacteria ESCHERICHIA COLI (A)       Susceptibility   Escherichia coli  - MIC*    AMPICILLIN 8 SENSITIVE Sensitive     CEFAZOLIN <=4 SENSITIVE Sensitive     CEFTRIAXONE <=1 SENSITIVE Sensitive     CIPROFLOXACIN <=0.25 SENSITIVE Sensitive     GENTAMICIN <=1 SENSITIVE Sensitive     IMIPENEM <=0.25 SENSITIVE Sensitive     NITROFURANTOIN <=16 SENSITIVE Sensitive     TRIMETH/SULFA <=20 SENSITIVE Sensitive     AMPICILLIN/SULBACTAM 4 SENSITIVE Sensitive     PIP/TAZO <=4 SENSITIVE Sensitive     Extended ESBL NEGATIVE Sensitive     * >=100,000 COLONIES/mL ESCHERICHIA COLI  Urinalysis, Microscopic (reflex)     Status: Abnormal   Collection Time: 10/24/16  5:47 PM  Result Value Ref Range   RBC / HPF 0-5 0 - 5 RBC/hpf   WBC, UA TOO NUMEROUS TO COUNT 0 - 5 WBC/hpf   Bacteria, UA MANY (A) NONE SEEN   Squamous Epithelial / LPF 0-5 (A) NONE SEEN  I-Stat CG4 Lactic Acid, ED     Status: Abnormal   Collection Time: 10/24/16  8:19 PM  Result Value Ref Range   Lactic Acid, Venous 2.39 (HH) 0.5 - 1.9 mmol/L   Comment NOTIFIED PHYSICIAN   MRSA PCR Screening     Status: None   Collection Time: 10/24/16 11:09 PM  Result Value Ref Range   MRSA by PCR NEGATIVE NEGATIVE    Comment:        The GeneXpert MRSA Assay (FDA approved for NASAL specimens only), is one component of a comprehensive MRSA colonization surveillance program. It is not intended to diagnose MRSA infection nor to  guide or monitor treatment for MRSA infections.   Respiratory Panel by PCR     Status: None   Collection Time: 10/25/16 12:44 AM  Result Value Ref Range   Adenovirus NOT DETECTED NOT DETECTED   Coronavirus 229E NOT DETECTED NOT DETECTED   Coronavirus HKU1 NOT DETECTED NOT DETECTED   Coronavirus NL63 NOT DETECTED NOT DETECTED   Coronavirus OC43 NOT DETECTED NOT DETECTED   Metapneumovirus NOT DETECTED NOT DETECTED   Rhinovirus / Enterovirus NOT DETECTED NOT DETECTED   Influenza A NOT DETECTED NOT DETECTED   Influenza B NOT DETECTED NOT DETECTED   Parainfluenza Virus 1 NOT DETECTED NOT  DETECTED   Parainfluenza Virus 2 NOT DETECTED NOT DETECTED   Parainfluenza Virus 3 NOT DETECTED NOT DETECTED   Parainfluenza Virus 4 NOT DETECTED NOT DETECTED   Respiratory Syncytial Virus NOT DETECTED NOT DETECTED   Bordetella pertussis NOT DETECTED NOT DETECTED   Chlamydophila pneumoniae NOT DETECTED NOT DETECTED   Mycoplasma pneumoniae NOT DETECTED NOT DETECTED  Lactic acid, plasma     Status: None   Collection Time: 10/25/16  1:09 AM  Result Value Ref Range   Lactic Acid, Venous 1.2 0.5 - 1.9 mmol/L  CBC     Status: Abnormal   Collection Time: 10/25/16  1:09 AM  Result Value Ref Range   WBC 9.3 4.0 - 10.5 K/uL   RBC 3.50 (L) 4.22 - 5.81 MIL/uL   Hemoglobin 10.5 (L) 13.0 - 17.0 g/dL   HCT 31.1 (L) 39.0 - 52.0 %   MCV 88.9 78.0 - 100.0 fL   MCH 30.0 26.0 - 34.0 pg   MCHC 33.8 30.0 - 36.0 g/dL   RDW 15.4 11.5 - 15.5 %   Platelets 159 150 - 400 K/uL  Basic metabolic panel     Status: Abnormal   Collection Time: 10/25/16  1:09 AM  Result Value Ref Range   Sodium 137 135 - 145 mmol/L   Potassium 4.4 3.5 - 5.1 mmol/L   Chloride 106 101 - 111 mmol/L   CO2 22 22 - 32 mmol/L   Glucose, Bld 151 (H) 65 - 99 mg/dL   BUN 18 6 - 20 mg/dL   Creatinine, Ser 1.07 0.61 - 1.24 mg/dL   Calcium 8.6 (L) 8.9 - 10.3 mg/dL   GFR calc non Af Amer 59 (L) >60 mL/min   GFR calc Af Amer >60 >60 mL/min    Comment: (NOTE) The eGFR has been calculated using the CKD EPI equation. This calculation has not been validated in all clinical situations. eGFR's persistently <60 mL/min signify possible Chronic Kidney Disease.    Anion gap 9 5 - 15  Procalcitonin     Status: None   Collection Time: 10/25/16  1:09 AM  Result Value Ref Range   Procalcitonin 0.17 ng/mL    Comment:        Interpretation: PCT (Procalcitonin) <= 0.5 ng/mL: Systemic infection (sepsis) is not likely. Local bacterial infection is possible. (NOTE)         ICU PCT Algorithm               Non ICU PCT Algorithm     ----------------------------     ------------------------------         PCT < 0.25 ng/mL                 PCT < 0.1 ng/mL     Stopping of antibiotics            Stopping  of antibiotics       strongly encouraged.               strongly encouraged.    ----------------------------     ------------------------------       PCT level decrease by               PCT < 0.25 ng/mL       >= 80% from peak PCT       OR PCT 0.25 - 0.5 ng/mL          Stopping of antibiotics                                             encouraged.     Stopping of antibiotics           encouraged.    ----------------------------     ------------------------------       PCT level decrease by              PCT >= 0.25 ng/mL       < 80% from peak PCT        AND PCT >= 0.5 ng/mL            Continuin g antibiotics                                              encouraged.       Continuing antibiotics            encouraged.    ----------------------------     ------------------------------     PCT level increase compared          PCT > 0.5 ng/mL         with peak PCT AND          PCT >= 0.5 ng/mL             Escalation of antibiotics                                          strongly encouraged.      Escalation of antibiotics        strongly encouraged.   Lactic acid, plasma     Status: None   Collection Time: 10/25/16  3:56 AM  Result Value Ref Range   Lactic Acid, Venous 1.0 0.5 - 1.9 mmol/L  Glucose, capillary     Status: Abnormal   Collection Time: 10/25/16  1:48 PM  Result Value Ref Range   Glucose-Capillary 129 (H) 65 - 99 mg/dL  Glucose, capillary     Status: Abnormal   Collection Time: 10/25/16  4:52 PM  Result Value Ref Range   Glucose-Capillary 162 (H) 65 - 99 mg/dL  Glucose, capillary     Status: Abnormal   Collection Time: 10/25/16  8:51 PM  Result Value Ref Range   Glucose-Capillary 187 (H) 65 - 99 mg/dL  Glucose, capillary     Status: Abnormal   Collection Time: 10/26/16  7:47 AM  Result Value Ref Range     Glucose-Capillary 165 (H) 65 - 99 mg/dL  Glucose, capillary     Status: Abnormal  Collection Time: 10/26/16 11:45 AM  Result Value Ref Range   Glucose-Capillary 168 (H) 65 - 99 mg/dL  Glucose, capillary     Status: Abnormal   Collection Time: 10/26/16  4:54 PM  Result Value Ref Range   Glucose-Capillary 144 (H) 65 - 99 mg/dL  Glucose, capillary     Status: Abnormal   Collection Time: 10/26/16  9:08 PM  Result Value Ref Range   Glucose-Capillary 129 (H) 65 - 99 mg/dL  CBC     Status: Abnormal   Collection Time: 10/27/16  4:42 AM  Result Value Ref Range   WBC 6.9 4.0 - 10.5 K/uL   RBC 3.66 (L) 4.22 - 5.81 MIL/uL   Hemoglobin 10.9 (L) 13.0 - 17.0 g/dL   HCT 32.1 (L) 39.0 - 52.0 %   MCV 87.7 78.0 - 100.0 fL   MCH 29.8 26.0 - 34.0 pg   MCHC 34.0 30.0 - 36.0 g/dL   RDW 15.5 11.5 - 15.5 %   Platelets 199 150 - 400 K/uL  Basic metabolic panel     Status: Abnormal   Collection Time: 10/27/16  4:42 AM  Result Value Ref Range   Sodium 134 (L) 135 - 145 mmol/L   Potassium 4.2 3.5 - 5.1 mmol/L   Chloride 103 101 - 111 mmol/L   CO2 23 22 - 32 mmol/L   Glucose, Bld 165 (H) 65 - 99 mg/dL   BUN 13 6 - 20 mg/dL   Creatinine, Ser 1.17 0.61 - 1.24 mg/dL   Calcium 9.1 8.9 - 10.3 mg/dL   GFR calc non Af Amer 53 (L) >60 mL/min   GFR calc Af Amer >60 >60 mL/min    Comment: (NOTE) The eGFR has been calculated using the CKD EPI equation. This calculation has not been validated in all clinical situations. eGFR's persistently <60 mL/min signify possible Chronic Kidney Disease.    Anion gap 8 5 - 15  Glucose, capillary     Status: Abnormal   Collection Time: 10/27/16  8:06 AM  Result Value Ref Range   Glucose-Capillary 164 (H) 65 - 99 mg/dL  Glucose, capillary     Status: Abnormal   Collection Time: 10/27/16 11:59 AM  Result Value Ref Range   Glucose-Capillary 166 (H) 65 - 99 mg/dL  Urinalysis, Routine w reflex microscopic     Status: Abnormal   Collection Time: 10/31/16 12:37 AM   Result Value Ref Range   Color, Urine YELLOW YELLOW   APPearance CLEAR CLEAR   Specific Gravity, Urine 1.013 1.005 - 1.030   pH 6.5 5.0 - 8.0   Glucose, UA NEGATIVE NEGATIVE mg/dL   Hgb urine dipstick NEGATIVE NEGATIVE   Bilirubin Urine NEGATIVE NEGATIVE   Ketones, ur NEGATIVE NEGATIVE mg/dL   Protein, ur NEGATIVE NEGATIVE mg/dL   Nitrite NEGATIVE NEGATIVE   Leukocytes, UA TRACE (A) NEGATIVE  CBC with Differential/Platelet     Status: Abnormal   Collection Time: 10/31/16 12:37 AM  Result Value Ref Range   WBC 10.2 4.0 - 10.5 K/uL   RBC 3.71 (L) 4.22 - 5.81 MIL/uL   Hemoglobin 11.3 (L) 13.0 - 17.0 g/dL   HCT 33.4 (L) 39.0 - 52.0 %   MCV 90.0 78.0 - 100.0 fL   MCH 30.5 26.0 - 34.0 pg   MCHC 33.8 30.0 - 36.0 g/dL   RDW 15.4 11.5 - 15.5 %   Platelets 261 150 - 400 K/uL   Neutrophils Relative % 53 %   Neutro Abs 5.5 1.7 -  7.7 K/uL   Lymphocytes Relative 20 %   Lymphs Abs 2.0 0.7 - 4.0 K/uL   Monocytes Relative 13 %   Monocytes Absolute 1.3 (H) 0.1 - 1.0 K/uL   Eosinophils Relative 13 %   Eosinophils Absolute 1.4 (H) 0.0 - 0.7 K/uL   Basophils Relative 1 %   Basophils Absolute 0.1 0.0 - 0.1 K/uL  Basic metabolic panel     Status: Abnormal   Collection Time: 10/31/16 12:37 AM  Result Value Ref Range   Sodium 133 (L) 135 - 145 mmol/L   Potassium 4.2 3.5 - 5.1 mmol/L   Chloride 101 101 - 111 mmol/L   CO2 23 22 - 32 mmol/L   Glucose, Bld 155 (H) 65 - 99 mg/dL   BUN 17 6 - 20 mg/dL   Creatinine, Ser 1.01 0.61 - 1.24 mg/dL   Calcium 9.3 8.9 - 10.3 mg/dL   GFR calc non Af Amer >60 >60 mL/min   GFR calc Af Amer >60 >60 mL/min    Comment: (NOTE) The eGFR has been calculated using the CKD EPI equation. This calculation has not been validated in all clinical situations. eGFR's persistently <60 mL/min signify possible Chronic Kidney Disease.    Anion gap 9 5 - 15  Urinalysis, Microscopic (reflex)     Status: Abnormal   Collection Time: 10/31/16 12:37 AM  Result Value Ref Range    RBC / HPF NONE SEEN 0 - 5 RBC/hpf   WBC, UA 0-5 0 - 5 WBC/hpf   Bacteria, UA RARE (A) NONE SEEN   Squamous Epithelial / LPF 0-5 (A) NONE SEEN  Blood culture (routine x 2)     Status: None   Collection Time: 10/31/16  2:05 AM  Result Value Ref Range   Specimen Description BLOOD RIGHT FOREARM    Special Requests BOTTLES DRAWN AEROBIC AND ANAEROBIC 5CC EACH    Culture      NO GROWTH 5 DAYS Performed at [facility], [place] [address] [place], [place] [address]    Report Status 11/05/2016 FINAL   Blood culture (routine x 2)     Status: None   Collection Time: 10/31/16  2:10 AM  Result Value Ref Range   Specimen Description BLOOD LEFT FOREARM    Special Requests BOTTLES DRAWN AEROBIC AND ANAEROBIC 5CC EACH    Culture      NO GROWTH 5 DAYS Performed at [facility], [place] [address], [place], [place] [address]    Report Status 11/05/2016 FINAL   Glucose, capillary     Status: Abnormal   Collection Time: 10/31/16  4:48 PM  Result Value Ref Range   Glucose-Capillary 146 (H) 65 - 99 mg/dL  Strep pneumoniae urinary antigen     Status: None   Collection Time: 10/31/16  6:30 PM  Result Value Ref Range   Strep Pneumo Urinary Antigen NEGATIVE NEGATIVE    Comment:        Infection due to S. pneumoniae cannot be absolutely ruled out since the antigen present may be below the detection limit of the test.   Glucose, capillary     Status: Abnormal   Collection Time: 10/31/16 10:17 PM  Result Value Ref Range   Glucose-Capillary 135 (H) 65 - 99 mg/dL  Basic metabolic panel     Status: Abnormal   Collection Time: 11/01/16  5:23 AM  Result Value Ref Range   Sodium 133 (L) 135 - 145 mmol/L   Potassium 4.2 3.5 - 5.1  mmol/L   Chloride 100 (L) 101 - 111 mmol/L   CO2 22 22 - 32 mmol/L   Glucose, Bld 169 (H) 65 - 99 mg/dL   BUN 15 6 - 20 mg/dL   Creatinine, Ser 1.15 0.61 - 1.24 mg/dL   Calcium 9.2 8.9 - 10.3 mg/dL   GFR calc non Af Amer 54 (L) >60 mL/min   GFR calc Af Amer >60  >60 mL/min    Comment: (NOTE) The eGFR has been calculated using the CKD EPI equation. This calculation has not been validated in all clinical situations. eGFR's persistently <60 mL/min signify possible Chronic Kidney Disease.    Anion gap 11 5 - 15  CBC     Status: Abnormal   Collection Time: 11/01/16  5:23 AM  Result Value Ref Range   WBC 8.5 4.0 - 10.5 K/uL   RBC 3.82 (L) 4.22 - 5.81 MIL/uL   Hemoglobin 11.2 (L) 13.0 - 17.0 g/dL   HCT 33.8 (L) 39.0 - 52.0 %   MCV 88.5 78.0 - 100.0 fL   MCH 29.3 26.0 - 34.0 pg   MCHC 33.1 30.0 - 36.0 g/dL   RDW 15.6 (H) 11.5 - 15.5 %   Platelets 243 150 - 400 K/uL  Glucose, capillary     Status: Abnormal   Collection Time: 11/01/16  8:09 AM  Result Value Ref Range   Glucose-Capillary 155 (H) 65 - 99 mg/dL  Glucose, capillary     Status: Abnormal   Collection Time: 11/01/16 12:39 PM  Result Value Ref Range   Glucose-Capillary 182 (H) 65 - 99 mg/dL  Glucose, capillary     Status: Abnormal   Collection Time: 11/01/16  5:22 PM  Result Value Ref Range   Glucose-Capillary 181 (H) 65 - 99 mg/dL  Glucose, capillary     Status: Abnormal   Collection Time: 11/01/16  9:16 PM  Result Value Ref Range   Glucose-Capillary 144 (H) 65 - 99 mg/dL  Glucose, capillary     Status: Abnormal   Collection Time: 11/02/16  8:09 AM  Result Value Ref Range   Glucose-Capillary 170 (H) 65 - 99 mg/dL  Glucose, capillary     Status: Abnormal   Collection Time: 11/02/16 12:43 PM  Result Value Ref Range   Glucose-Capillary 181 (H) 65 - 99 mg/dL  Glucose, capillary     Status: Abnormal   Collection Time: 11/02/16  5:13 PM  Result Value Ref Range   Glucose-Capillary 139 (H) 65 - 99 mg/dL  Glucose, capillary     Status: Abnormal   Collection Time: 11/02/16  9:20 PM  Result Value Ref Range   Glucose-Capillary 184 (H) 65 - 99 mg/dL  Glucose, capillary     Status: Abnormal   Collection Time: 11/03/16  7:42 AM  Result Value Ref Range   Glucose-Capillary 192 (H) 65 -  99 mg/dL  Glucose, capillary     Status: Abnormal   Collection Time: 11/03/16 12:31 PM  Result Value Ref Range   Glucose-Capillary 170 (H) 65 - 99 mg/dL  CBC     Status: Abnormal   Collection Time: 11/11/16 12:30 PM  Result Value Ref Range   WBC 11.4 (H) 4.0 - 10.5 K/uL   RBC 4.28 4.22 - 5.81 Mil/uL   Platelets 323.0 150.0 - 400.0 K/uL   Hemoglobin 12.7 (L) 13.0 - 17.0 g/dL   HCT 38.5 (L) 39.0 - 52.0 %   MCV 89.9 78.0 - 100.0 fl   MCHC 32.9 30.0 - 36.0  g/dL   RDW 16.1 (H) 11.5 - 59.0 %  Basic metabolic panel     Status: Abnormal   Collection Time: 11/11/16 12:30 PM  Result Value Ref Range   Sodium 136 135 - 145 mEq/L   Potassium 5.2 (H) 3.5 - 5.1 mEq/L   Chloride 98 96 - 112 mEq/L   CO2 25 19 - 32 mEq/L   Glucose, Bld 122 (H) 70 - 99 mg/dL   BUN 25 (H) 6 - 23 mg/dL   Creatinine, Ser 1.15 0.40 - 1.50 mg/dL   Calcium 10.0 8.4 - 10.5 mg/dL   GFR 63.35 >60.00 mL/min   Assessment/Plan: 1. HCAP (healthcare-associated pneumonia) Repeat CBC today. Clinically resolved. Lungs CTAB. O2 levels great today. Continue incentive spirometry at home. Alarm signs/symptoms discussed with patient that would prompt ER assessment.  - CBC  2. Diabetes mellitus with complication (HCC) Stop Glucotrol for now. Continue Metformin. Close monitoring of fasting sugar levels. FU 2 weeks.  3. Urinary retention Continue cath. Repeat urine culture. - CULTURE, URINE COMPREHENSIVE; Future - CULTURE, URINE COMPREHENSIVE  4. Renal insufficiency Repeat BMP today. - Basic metabolic panel   Leeanne Rio, PA-C

## 2016-11-11 NOTE — Patient Instructions (Signed)
Please go to the lab for blood work. I will call you with your results.  I want you to continue chronic medications with the following exceptions:  -- Stop the Glucotrol XL for now.  -- Stop the Lisinopril.  Please continue good hydration. Encourage eating.  Keep checking fasting glucose ever day and record. If you note fasting sugars remaining < 120, please call me.   Continue monitoring energy levels.  This should gradually improve. Use the incentive spirometer 4-5 times each hour that he is sitting over the next week. If you note any fever, chills, respiratory symptoms or change in mental status, go to the ER.    Follow-up in 2 weeks.

## 2016-11-11 NOTE — Progress Notes (Signed)
Pre visit review using our clinic review tool, if applicable. No additional management support is needed unless otherwise documented below in the visit note. 

## 2016-11-13 ENCOUNTER — Encounter: Payer: Self-pay | Admitting: Physician Assistant

## 2016-11-13 DIAGNOSIS — J189 Pneumonia, unspecified organism: Secondary | ICD-10-CM

## 2016-11-13 DIAGNOSIS — I69354 Hemiplegia and hemiparesis following cerebral infarction affecting left non-dominant side: Secondary | ICD-10-CM | POA: Diagnosis not present

## 2016-11-13 DIAGNOSIS — F0390 Unspecified dementia without behavioral disturbance: Secondary | ICD-10-CM | POA: Diagnosis not present

## 2016-11-13 DIAGNOSIS — R2689 Other abnormalities of gait and mobility: Secondary | ICD-10-CM | POA: Diagnosis not present

## 2016-11-13 DIAGNOSIS — E1165 Type 2 diabetes mellitus with hyperglycemia: Secondary | ICD-10-CM | POA: Diagnosis not present

## 2016-11-13 DIAGNOSIS — N390 Urinary tract infection, site not specified: Secondary | ICD-10-CM | POA: Diagnosis not present

## 2016-11-13 DIAGNOSIS — I69398 Other sequelae of cerebral infarction: Secondary | ICD-10-CM | POA: Diagnosis not present

## 2016-11-13 LAB — CULTURE, URINE COMPREHENSIVE: ORGANISM ID, BACTERIA: NO GROWTH

## 2016-11-13 MED ORDER — GLUCOSE BLOOD VI STRP: ORAL_STRIP | 12 refills | Status: DC

## 2016-11-14 ENCOUNTER — Encounter: Payer: Self-pay | Admitting: Physician Assistant

## 2016-11-15 DIAGNOSIS — R2689 Other abnormalities of gait and mobility: Secondary | ICD-10-CM | POA: Diagnosis not present

## 2016-11-15 DIAGNOSIS — E1165 Type 2 diabetes mellitus with hyperglycemia: Secondary | ICD-10-CM | POA: Diagnosis not present

## 2016-11-15 DIAGNOSIS — N390 Urinary tract infection, site not specified: Secondary | ICD-10-CM | POA: Diagnosis not present

## 2016-11-15 DIAGNOSIS — I69354 Hemiplegia and hemiparesis following cerebral infarction affecting left non-dominant side: Secondary | ICD-10-CM | POA: Diagnosis not present

## 2016-11-15 DIAGNOSIS — F0390 Unspecified dementia without behavioral disturbance: Secondary | ICD-10-CM | POA: Diagnosis not present

## 2016-11-15 DIAGNOSIS — I69398 Other sequelae of cerebral infarction: Secondary | ICD-10-CM | POA: Diagnosis not present

## 2016-11-17 ENCOUNTER — Encounter: Payer: Self-pay | Admitting: Physician Assistant

## 2016-11-18 ENCOUNTER — Ambulatory Visit (INDEPENDENT_AMBULATORY_CARE_PROVIDER_SITE_OTHER): Payer: Medicare Other | Admitting: Physician Assistant

## 2016-11-18 ENCOUNTER — Encounter: Payer: Self-pay | Admitting: Physician Assistant

## 2016-11-18 VITALS — BP 118/62 | HR 62 | Temp 97.5°F | Resp 14 | Ht 66.0 in | Wt 156.0 lb

## 2016-11-18 DIAGNOSIS — E1165 Type 2 diabetes mellitus with hyperglycemia: Secondary | ICD-10-CM | POA: Diagnosis not present

## 2016-11-18 DIAGNOSIS — R399 Unspecified symptoms and signs involving the genitourinary system: Secondary | ICD-10-CM | POA: Diagnosis not present

## 2016-11-18 DIAGNOSIS — IMO0001 Reserved for inherently not codable concepts without codable children: Secondary | ICD-10-CM

## 2016-11-18 LAB — BASIC METABOLIC PANEL
BUN: 14 mg/dL (ref 6–23)
CHLORIDE: 99 meq/L (ref 96–112)
CO2: 27 meq/L (ref 19–32)
Calcium: 9.6 mg/dL (ref 8.4–10.5)
Creatinine, Ser: 0.99 mg/dL (ref 0.40–1.50)
GFR: 75.31 mL/min (ref >60.00)
GLUCOSE: 143 mg/dL — AB (ref 70–99)
POTASSIUM: 5.4 meq/L — AB (ref 3.5–5.1)
SODIUM: 137 meq/L (ref 135–145)

## 2016-11-18 LAB — POCT URINALYSIS DIPSTICK
Bilirubin, UA: NEGATIVE
Blood, UA: NEGATIVE
GLUCOSE UA: NEGATIVE
Ketones, UA: NEGATIVE
NITRITE UA: NEGATIVE
PROTEIN UA: NEGATIVE
Spec Grav, UA: 1.015
UROBILINOGEN UA: 0.2
pH, UA: 6

## 2016-11-18 LAB — CBC
HCT: 34.5 % — ABNORMAL LOW (ref 39.0–52.0)
HEMOGLOBIN: 11.7 g/dL — AB (ref 13.0–17.0)
MCHC: 33.9 g/dL (ref 30.0–36.0)
MCV: 89 fl (ref 78.0–100.0)
Platelets: 215 10*3/uL (ref 150.0–400.0)
RBC: 3.88 Mil/uL — AB (ref 4.22–5.81)
RDW: 15.9 % — ABNORMAL HIGH (ref 11.5–15.5)
WBC: 9.4 10*3/uL (ref 4.0–10.5)

## 2016-11-18 LAB — MICROALBUMIN / CREATININE URINE RATIO
CREATININE, U: 35.1 mg/dL
Microalb Creat Ratio: 11.1 mg/g (ref 0.0–30.0)
Microalb, Ur: 3.9 mg/dL — ABNORMAL HIGH (ref 0.0–1.9)

## 2016-11-18 MED ORDER — CEPHALEXIN 500 MG PO CAPS: 500.0000 mg | ORAL_CAPSULE | Freq: Two times a day (BID) | ORAL | 0 refills | Status: DC

## 2016-11-18 MED ORDER — RELION PRIME MONITOR DEVI: 0 refills | Status: DC

## 2016-11-18 MED ORDER — GLUCOSE BLOOD VI STRP: ORAL_STRIP | 12 refills | Status: DC

## 2016-11-18 NOTE — Patient Instructions (Signed)
Your symptoms are consistent with a bladder infection, also called acute cystitis. Please take your antibiotic (Keflex) as directed until all pills are gone.  Stay very well hydrated.  Consider a daily probiotic (Align, Culturelle, or Activia) to help prevent stomach upset caused by the antibiotic.  Taking a probiotic daily may also help prevent recurrent UTIs.  Also consider taking AZO (Phenazopyridine) tablets to help decrease pain with urination.  I will call you with your urine testing results.  We will change antibiotics if indicated.  Call or return to clinic if symptoms are not resolved by completion of antibiotic.   Urinary Tract Infection A urinary tract infection (UTI) can occur any place along the urinary tract. The tract includes the kidneys, ureters, bladder, and urethra. A type of germ called bacteria often causes a UTI. UTIs are often helped with antibiotic medicine.  HOME CARE   If given, take antibiotics as told by your doctor. Finish them even if you start to feel better.  Drink enough fluids to keep your pee (urine) clear or pale yellow.  Avoid tea, drinks with caffeine, and bubbly (carbonated) drinks.  Pee often. Avoid holding your pee in for a long time.  Pee before and after having sex (intercourse).  Wipe from front to back after you poop (bowel movement) if you are a woman. Use each tissue only once. GET HELP RIGHT AWAY IF:   You have back pain.  You have lower belly (abdominal) pain.  You have chills.  You feel sick to your stomach (nauseous).  You throw up (vomit).  Your burning or discomfort with peeing does not go away.  You have a fever.  Your symptoms are not better in 3 days. MAKE SURE YOU:   Understand these instructions.  Will watch your condition.  Will get help right away if you are not doing well or get worse. Document Released: 02/26/2008 Document Revised: 06/03/2012 Document Reviewed: 04/09/2012 [employer] Patient Information 2015  [employer]. This information is not intended to replace advice given to you by your health care provider. Make sure you discuss any questions you have with your health care provider.   

## 2016-11-18 NOTE — Progress Notes (Signed)
Pre visit review using our clinic review tool, if applicable. No additional management support is needed unless otherwise documented below in the visit note. 

## 2016-11-19 ENCOUNTER — Encounter: Payer: Self-pay | Admitting: Physician Assistant

## 2016-11-19 DIAGNOSIS — I69354 Hemiplegia and hemiparesis following cerebral infarction affecting left non-dominant side: Secondary | ICD-10-CM | POA: Diagnosis not present

## 2016-11-19 DIAGNOSIS — E1165 Type 2 diabetes mellitus with hyperglycemia: Secondary | ICD-10-CM | POA: Diagnosis not present

## 2016-11-19 DIAGNOSIS — N390 Urinary tract infection, site not specified: Secondary | ICD-10-CM | POA: Diagnosis not present

## 2016-11-19 DIAGNOSIS — F0390 Unspecified dementia without behavioral disturbance: Secondary | ICD-10-CM | POA: Diagnosis not present

## 2016-11-19 DIAGNOSIS — R2689 Other abnormalities of gait and mobility: Secondary | ICD-10-CM | POA: Diagnosis not present

## 2016-11-19 DIAGNOSIS — I69398 Other sequelae of cerebral infarction: Secondary | ICD-10-CM | POA: Diagnosis not present

## 2016-11-19 NOTE — Progress Notes (Signed)
Patient presents to clinic today for urine assessment to rule in/out a UTI. Patient with history of urinary retention, having home catheterizations performed by his wife. Is followed by Urology Ree Edman) at [facility]. Has had recent issue with urinary tract infections. Wife has noted some cloudiness of urine over the past day. No complaints of hematuria, mental status changes. No urgency noted. Denies back pain, suprapubic pain, flank pain, fever or chills. Wife has just obtained cathed urine sample for assessment.    Past Medical History:  Diagnosis Date  . A-fib ([place])   . Cataracts, bilateral   . Chronic dermatitis   . Diabetes type 2, controlled ([place]) 2000  . History of chicken pox   . Mumps    Adult  . Retinopathy   . Shingles   . Stroke [facility]) Nov 2011  . Undescended testicle, unilateral    Right  . Whooping cough     Current Outpatient Prescriptions on File Prior to Visit  Medication Sig Dispense Refill  . aspirin 81 MG tablet Take 1 tablet (81 mg total) by mouth daily.    . Blood Glucose Monitoring Suppl (RELION PRIME MONITOR) DEVI Use to check fasting sugars daily. E 11.9 1 Device 0  . Calcium Carbonate Antacid (ALKA-SELTZER ANTACID PO) Take 2 tablets by mouth as needed (for indegestion).     . cyanocobalamin (,VITAMIN B-12,) 1000 MCG/ML injection Inject 1000 mcg intramuscular daily for 7 days, then once a week for 4 weeks, then once a month for a year (Patient taking differently: Inject 1,000 mcg into the muscle every 30 (thirty) days. ) 25 mL 0  . docusate sodium (COLACE) 100 MG capsule Take 100-200 mg by mouth See admin instructions. Take 100 mg by mouth daily for 2 days, then on every third day take 200 mg by mouth daily. Alternate 100 mg and 200 mg    . Eyelid Cleansers (STERILID EX) Apply 1 application topically See admin instructions. Eye Wash: Once Daily     . glucose blood (RELION PRIME TEST) test strip Use as instructed 100 each 12  . levothyroxine (SYNTHROID,  LEVOTHROID) 50 MCG tablet Take 1 tablet (50 mcg total) by mouth daily. 90 tablet 3  . lovastatin (MEVACOR) 10 MG tablet Take 5 mg by mouth at bedtime.     . metFORMIN (GLUCOPHAGE-XR) 500 MG 24 hr tablet Take 2 tablets with breakfast and at bedtime (Patient taking differently: Take 1,000 mg by mouth 2 (two) times daily. ) 360 tablet 1  . metoprolol tartrate (LOPRESSOR) 25 MG tablet Take 0.5 tablets (12.5 mg total) by mouth 2 (two) times daily. 180 tablet 1  . Miconazole Nitrate (TRIPLE PASTE AF) 2 % OINT Apply 1 application topically once a week.     . Omega-3 Fatty Acids (SB OMEGA-3 FISH OIL) 1000 MG CAPS Take 1,000 mg by mouth 2 (two) times daily. Reported on 10/06/2015    . OVER THE COUNTER MEDICATION Take 1 capsule by mouth 2 (two) times daily. OTC Focus Select    . Probiotic Product (PROBIOTIC-10 PO) Take 1 capsule by mouth daily.    Marland Kitchen triamcinolone cream (KENALOG) 0.1 % Apply 1 application topically as needed (for itching).     Marland Kitchen glipiZIDE (GLUCOTROL XL) 2.5 MG 24 hr tablet Take 1 tablet (2.5 mg total) by mouth daily with breakfast. (Patient not taking: Reported on 11/18/2016) 30 tablet 5   No current facility-administered medications on file prior to visit.     Allergies  Allergen Reactions  .  Flomax [Tamsulosin Hcl] Other (See Comments)    Hypotension  . Shellfish Allergy Anaphylaxis  . Aspirin-Dipyridamole Er Other (See Comments)    Headaches   . Other Other (See Comments)    Blood Thinner given in Rehab gave H/As - Not Coumadin/Headaches      Family History  Problem Relation Age of Onset  . Pneumonia Mother 14    Deceased  . GI Bleed Father 74    Deceased - Ulcers  . Diabetes Cousin   . Diabetes Brother   . Cancer Paternal Aunt     Social History   Social History  . Marital status: Married    Spouse name: N/A  . Number of children: N/A  . Years of education: N/A   Social History Main Topics  . Smoking status: Former Research scientist (life sciences)  . Smokeless tobacco: Never Used      Comment: Hasn't smoked since age 89  . Alcohol use No  . Drug use: No  . Sexual activity: Not Asked   Other Topics Concern  . None   Social History Narrative   Lives with wife in a one story home.  Has 1 child.  Retired Armed forces technical officer.  Education: Oceanographer.      Review of Systems - See HPI.  All other ROS are negative.  BP 118/62   Pulse 62   Temp 97.5 F (36.4 C) (Oral)   Resp 14   Ht _0  (1.676 m)   Wt 156 lb (70.8 kg)   SpO2 97%   BMI 25.18 kg/m   Physical Exam  Constitutional: He is oriented to person, place, and time and well-developed, well-nourished, and in no distress.  HENT:  Head: Normocephalic and atraumatic.  Eyes: Conjunctivae are normal.  Neck: Neck supple.  Cardiovascular: Normal rate, regular rhythm, normal heart sounds and intact distal pulses.   Pulmonary/Chest: Effort normal and breath sounds normal. No respiratory distress. He has no wheezes. He has no rales. He exhibits no tenderness.  Abdominal: Soft. Bowel sounds are normal. He exhibits no distension. There is no tenderness.  Neurological: He is alert and oriented to person, place, and time.  Skin: Skin is warm and dry. No rash noted.  Psychiatric: Affect normal.  Vitals reviewed.   Recent Results (from the past 2160 hour(s))  TSH     Status: None   Collection Time: 08/23/16  1:22 PM  Result Value Ref Range   TSH 2.92 0.35 - 4.50 uIU/mL  Lipid panel     Status: Abnormal   Collection Time: 09/13/16 12:21 PM  Result Value Ref Range   Cholesterol 91 0 - 200 mg/dL    Comment: ATP III Classification       Desirable:  < 200 mg/dL               Borderline High:  200 - 239 mg/dL          High:  > = 240 mg/dL   Triglycerides 88.0 0.0 - 149.0 mg/dL    Comment: Normal:  <150 mg/dLBorderline High:  150 - 199 mg/dL   HDL 31.30 (L) >39.00 mg/dL   VLDL 17.6 0.0 - 40.0 mg/dL   LDL Cholesterol 42 0 - 99 mg/dL   Total CHOL/HDL Ratio 3     Comment:                Men          Women1/2 Average Risk     3.4  3.3Average Risk          5.0          4.42X Average Risk          9.6          7.13X Average Risk          15.0          11.0                       NonHDL 59.92     Comment: NOTE:  Non-HDL goal should be 30 mg/dL higher than patient's LDL goal (i.e. LDL goal of < 70 mg/dL, would have non-HDL goal of < 100 mg/dL)  Comp Met (CMET)     Status: Abnormal   Collection Time: 09/13/16 12:21 PM  Result Value Ref Range   Sodium 136 135 - 145 mEq/L   Potassium 5.4 (H) 3.5 - 5.1 mEq/L   Chloride 100 96 - 112 mEq/L   CO2 28 19 - 32 mEq/L   Glucose, Bld 133 (H) 70 - 99 mg/dL   BUN 20 6 - 23 mg/dL   Creatinine, Ser 1.13 0.40 - 1.50 mg/dL   Total Bilirubin 0.3 0.2 - 1.2 mg/dL   Alkaline Phosphatase 54 39 - 117 U/L   AST 30 0 - 37 U/L   ALT 39 0 - 53 U/L   Total Protein 6.7 6.0 - 8.3 g/dL   Albumin 4.0 3.5 - 5.2 g/dL   Calcium 9.6 8.4 - 10.5 mg/dL   GFR 64.67 >60.00 mL/min  CBC with Differential     Status: Abnormal   Collection Time: 10/01/16 10:20 PM  Result Value Ref Range   WBC 13.3 (H) 4.0 - 10.5 K/uL   RBC 3.77 (L) 4.22 - 5.81 MIL/uL   Hemoglobin 11.4 (L) 13.0 - 17.0 g/dL   HCT 34.2 (L) 39.0 - 52.0 %   MCV 90.7 78.0 - 100.0 fL   MCH 30.2 26.0 - 34.0 pg   MCHC 33.3 30.0 - 36.0 g/dL   RDW 14.4 11.5 - 15.5 %   Platelets 205 150 - 400 K/uL   Neutrophils Relative % 72 %   Neutro Abs 9.4 (H) 1.7 - 7.7 K/uL   Lymphocytes Relative 15 %   Lymphs Abs 2.0 0.7 - 4.0 K/uL   Monocytes Relative 7 %   Monocytes Absolute 1.0 0.1 - 1.0 K/uL   Eosinophils Relative 6 %   Eosinophils Absolute 0.8 (H) 0.0 - 0.7 K/uL   Basophils Relative 0 %   Basophils Absolute 0.1 0.0 - 0.1 K/uL  Basic metabolic panel     Status: Abnormal   Collection Time: 10/01/16 10:20 PM  Result Value Ref Range   Sodium 134 (L) 135 - 145 mmol/L   Potassium 4.7 3.5 - 5.1 mmol/L   Chloride 98 (L) 101 - 111 mmol/L   CO2 26 22 - 32 mmol/L   Glucose, Bld 144 (H) 65 - 99 mg/dL   BUN 29 (H) 6 - 20 mg/dL   Creatinine, Ser  1.03 0.61 - 1.24 mg/dL   Calcium 9.5 8.9 - 10.3 mg/dL   GFR calc non Af Amer >60 >60 mL/min   GFR calc Af Amer >60 >60 mL/min    Comment: (NOTE) The eGFR has been calculated using the CKD EPI equation. This calculation has not been validated in all clinical situations. eGFR's persistently <60 mL/min signify possible Chronic Kidney Disease.    Anion gap 10 5 -  15  Urinalysis, Routine w reflex microscopic     Status: Abnormal   Collection Time: 10/01/16 10:45 PM  Result Value Ref Range   Color, Urine RED (A) YELLOW    Comment: BIOCHEMICALS MAY BE AFFECTED BY COLOR   APPearance TURBID (A) CLEAR   Specific Gravity, Urine 1.020 1.005 - 1.030   pH 7.0 5.0 - 8.0   Glucose, UA NEGATIVE NEGATIVE mg/dL   Hgb urine dipstick LARGE (A) NEGATIVE   Bilirubin Urine SMALL (A) NEGATIVE   Ketones, ur 15 (A) NEGATIVE mg/dL   Protein, ur >300 (A) NEGATIVE mg/dL   Nitrite POSITIVE (A) NEGATIVE   Leukocytes, UA LARGE (A) NEGATIVE  Urinalysis, Microscopic (reflex)     Status: Abnormal   Collection Time: 10/01/16 10:45 PM  Result Value Ref Range   RBC / HPF TOO NUMEROUS TO COUNT 0 - 5 RBC/hpf   WBC, UA TOO NUMEROUS TO COUNT 0 - 5 WBC/hpf   Bacteria, UA FEW (A) NONE SEEN   Squamous Epithelial / LPF 0-5 (A) NONE SEEN  Urine culture     Status: Abnormal   Collection Time: 10/01/16 10:45 PM  Result Value Ref Range   Specimen Description URINE, CATHETERIZED    Special Requests NONE    Culture >=100,000 COLONIES/mL ENTEROCOCCUS FAECALIS (A)    Report Status 10/04/2016 FINAL    Organism ID, Bacteria ENTEROCOCCUS FAECALIS (A)       Susceptibility   Enterococcus faecalis - MIC*    AMPICILLIN <=2 SENSITIVE Sensitive     LEVOFLOXACIN 1 SENSITIVE Sensitive     NITROFURANTOIN <=16 SENSITIVE Sensitive     VANCOMYCIN 1 SENSITIVE Sensitive     * >=100,000 COLONIES/mL ENTEROCOCCUS FAECALIS  POCT Glucose (CBG)     Status: Abnormal   Collection Time: 10/14/16 10:45 AM  Result Value Ref Range   POC Glucose 161  (A) 70 - 99 mg/dl  Hemoglobin A1c     Status: None   Collection Time: 10/14/16 10:51 AM  Result Value Ref Range   Hgb A1c MFr Bld 6.4 4.6 - 6.5 %    Comment: Glycemic Control Guidelines for People with Diabetes:Non Diabetic:  <6%Goal of Therapy: <7%Additional Action Suggested:  >6%   Basic metabolic panel     Status: Abnormal   Collection Time: 10/14/16 10:51 AM  Result Value Ref Range   Sodium 133 (L) 135 - 145 mEq/L   Potassium 5.1 3.5 - 5.1 mEq/L   Chloride 98 96 - 112 mEq/L   CO2 29 19 - 32 mEq/L   Glucose, Bld 175 (H) 70 - 99 mg/dL   BUN 18 6 - 23 mg/dL   Creatinine, Ser 1.02 0.40 - 1.50 mg/dL   Calcium 9.7 8.4 - 10.5 mg/dL   GFR 72.77 >60.00 mL/min  Urine Culture     Status: None   Collection Time: 10/14/16 10:51 AM  Result Value Ref Range   Organism ID, Bacteria NO GROWTH   Urinalysis, Routine w reflex microscopic     Status: Abnormal   Collection Time: 10/17/16  3:33 PM  Result Value Ref Range   Color, Urine YELLOW Yellow;Lt. Yellow   APPearance CLEAR Clear   Specific Gravity, Urine <=1.005 (A) 1.000 - 1.030   pH 7.0 5.0 - 8.0   Total Protein, Urine NEGATIVE Negative   Urine Glucose NEGATIVE Negative   Ketones, ur NEGATIVE Negative   Bilirubin Urine NEGATIVE Negative   Hgb urine dipstick NEGATIVE Negative   Urobilinogen, UA 0.2 0.0 - 1.0   Leukocytes,  UA SMALL (A) Negative   Nitrite NEGATIVE Negative   WBC, UA 3-6/hpf (A) 0-2/hpf   RBC / HPF 0-2/hpf 0-2/hpf   Squamous Epithelial / LPF Rare(0-4/hpf) Rare(0-4/hpf)  Urine Culture     Status: None   Collection Time: 10/17/16  3:33 PM  Result Value Ref Range   Organism ID, Bacteria NO GROWTH   Lipid panel     Status: Abnormal   Collection Time: 10/18/16 11:49 AM  Result Value Ref Range   Cholesterol 139 0 - 200 mg/dL    Comment: ATP III Classification       Desirable:  < 200 mg/dL               Borderline High:  200 - 239 mg/dL          High:  > = 240 mg/dL   Triglycerides 161.0 (H) 0.0 - 149.0 mg/dL    Comment:  Normal:  <150 mg/dLBorderline High:  150 - 199 mg/dL   HDL 32.90 (L) >39.00 mg/dL   VLDL 32.2 0.0 - 40.0 mg/dL   LDL Cholesterol 73 0 - 99 mg/dL   Total CHOL/HDL Ratio 4     Comment:                Men          Women1/2 Average Risk     3.4          3.3Average Risk          5.0          4.42X Average Risk          9.6          7.13X Average Risk          15.0          11.0                       NonHDL 105.65     Comment: NOTE:  Non-HDL goal should be 30 mg/dL higher than patient's LDL goal (i.e. LDL goal of < 70 mg/dL, would have non-HDL goal of < 100 mg/dL)  Hepatic function panel     Status: Abnormal   Collection Time: 10/18/16 11:49 AM  Result Value Ref Range   Total Bilirubin 0.5 0.2 - 1.2 mg/dL   Bilirubin, Direct 0.1 0.0 - 0.3 mg/dL   Alkaline Phosphatase 58 39 - 117 U/L   AST 56 (H) 0 - 37 U/L   ALT 77 (H) 0 - 53 U/L   Total Protein 6.9 6.0 - 8.3 g/dL   Albumin 4.0 3.5 - 5.2 g/dL  CBC w/Diff     Status: Abnormal   Collection Time: 10/18/16 11:49 AM  Result Value Ref Range   WBC 8.5 4.0 - 10.5 K/uL   RBC 3.94 (L) 4.22 - 5.81 Mil/uL   Hemoglobin 11.9 (L) 13.0 - 17.0 g/dL   HCT 35.4 (L) 39.0 - 52.0 %   MCV 89.8 78.0 - 100.0 fl   MCHC 33.7 30.0 - 36.0 g/dL   RDW 15.9 (H) 11.5 - 15.5 %   Platelets 211.0 150.0 - 400.0 K/uL   Neutrophils Relative % 59.2 43.0 - 77.0 %   Lymphocytes Relative 17.9 12.0 - 46.0 %   Monocytes Relative 9.1 3.0 - 12.0 %   Eosinophils Relative 13.1 (H) 0.0 - 5.0 %   Basophils Relative 0.7 0.0 - 3.0 %   Neutro Abs 5.1 1.4 - 7.7 K/uL   Lymphs  Abs 1.5 0.7 - 4.0 K/uL   Monocytes Absolute 0.8 0.1 - 1.0 K/uL   Eosinophils Absolute 1.1 (H) 0.0 - 0.7 K/uL   Basophils Absolute 0.1 0.0 - 0.1 K/uL  C-reactive protein     Status: None   Collection Time: 10/18/16 11:49 AM  Result Value Ref Range   CRP 0.6 0.5 - 20.0 mg/dL  Blood Culture (routine x 2)     Status: None   Collection Time: 10/24/16  5:00 PM  Result Value Ref Range   Specimen Description BLOOD  LEFT ANTECUBITAL    Special Requests      BOTTLES DRAWN AEROBIC AND ANAEROBIC 5CC BOTH BOTTLES   Culture      NO GROWTH 5 DAYS Performed at [facility], [place] [address], [place], [place] [address]    Report Status 10/29/2016 FINAL   CBC with Differential     Status: Abnormal   Collection Time: 10/24/16  5:08 PM  Result Value Ref Range   WBC 12.1 (H) 4.0 - 10.5 K/uL   RBC 3.97 (L) 4.22 - 5.81 MIL/uL   Hemoglobin 12.2 (L) 13.0 - 17.0 g/dL   HCT 35.7 (L) 39.0 - 52.0 %   MCV 89.9 78.0 - 100.0 fL   MCH 30.7 26.0 - 34.0 pg   MCHC 34.2 30.0 - 36.0 g/dL   RDW 15.1 11.5 - 15.5 %   Platelets 202 150 - 400 K/uL   Neutrophils Relative % 72 %   Neutro Abs 8.8 (H) 1.7 - 7.7 K/uL   Lymphocytes Relative 11 %   Lymphs Abs 1.3 0.7 - 4.0 K/uL   Monocytes Relative 12 %   Monocytes Absolute 1.4 (H) 0.1 - 1.0 K/uL   Eosinophils Relative 4 %   Eosinophils Absolute 0.5 0.0 - 0.7 K/uL   Basophils Relative 1 %   Basophils Absolute 0.1 0.0 - 0.1 K/uL  Basic metabolic panel     Status: Abnormal   Collection Time: 10/24/16  5:08 PM  Result Value Ref Range   Sodium 132 (L) 135 - 145 mmol/L   Potassium 5.1 3.5 - 5.1 mmol/L   Chloride 98 (L) 101 - 111 mmol/L   CO2 23 22 - 32 mmol/L   Glucose, Bld 202 (H) 65 - 99 mg/dL   BUN 25 (H) 6 - 20 mg/dL   Creatinine, Ser 1.19 0.61 - 1.24 mg/dL   Calcium 9.4 8.9 - 10.3 mg/dL   GFR calc non Af Amer 52 (L) >60 mL/min   GFR calc Af Amer >60 >60 mL/min    Comment: (NOTE) The eGFR has been calculated using the CKD EPI equation. This calculation has not been validated in all clinical situations. eGFR's persistently <60 mL/min signify possible Chronic Kidney Disease.    Anion gap 11 5 - 15  Hepatic function panel     Status: Abnormal   Collection Time: 10/24/16  5:08 PM  Result Value Ref Range   Total Protein 7.2 6.5 - 8.1 g/dL   Albumin 3.7 3.5 - 5.0 g/dL   AST 56 (H) 15 - 41 U/L   ALT 78 (H) 17 - 63 U/L   Alkaline Phosphatase 59 38 - 126 U/L   Total  Bilirubin 0.6 0.3 - 1.2 mg/dL   Bilirubin, Direct 0.2 0.1 - 0.5 mg/dL   Indirect Bilirubin 0.4 0.3 - 0.9 mg/dL  Blood Culture (routine x 2)     Status: None   Collection Time: 10/24/16  5:10 PM  Result Value Ref Range  Specimen Description BLOOD LEFT FOREARM    Special Requests      BOTTLES DRAWN AEROBIC AND ANAEROBIC 5CC BOTH BOTTLES   Culture      NO GROWTH 5 DAYS Performed at [facility], [address] [place], [place] [address]    Report Status 10/29/2016 FINAL   I-Stat CG4 Lactic Acid, ED     Status: Abnormal   Collection Time: 10/24/16  5:23 PM  Result Value Ref Range   Lactic Acid, Venous 4.38 (HH) 0.5 - 1.9 mmol/L   Comment NOTIFIED PHYSICIAN   Urinalysis, Routine w reflex microscopic     Status: Abnormal   Collection Time: 10/24/16  5:47 PM  Result Value Ref Range   Color, Urine YELLOW YELLOW   APPearance CLOUDY (A) CLEAR   Specific Gravity, Urine 1.013 1.005 - 1.030   pH 6.5 5.0 - 8.0   Glucose, UA NEGATIVE NEGATIVE mg/dL   Hgb urine dipstick SMALL (A) NEGATIVE   Bilirubin Urine NEGATIVE NEGATIVE   Ketones, ur NEGATIVE NEGATIVE mg/dL   Protein, ur 30 (A) NEGATIVE mg/dL   Nitrite NEGATIVE NEGATIVE   Leukocytes, UA LARGE (A) NEGATIVE  Urine culture     Status: Abnormal   Collection Time: 10/24/16  5:47 PM  Result Value Ref Range   Specimen Description URINE, CLEAN CATCH    Special Requests NONE    Culture >=100,000 COLONIES/mL ESCHERICHIA COLI (A)    Report Status 10/27/2016 FINAL    Organism ID, Bacteria ESCHERICHIA COLI (A)       Susceptibility   Escherichia coli - MIC*    AMPICILLIN 8 SENSITIVE Sensitive     CEFAZOLIN <=4 SENSITIVE Sensitive     CEFTRIAXONE <=1 SENSITIVE Sensitive     CIPROFLOXACIN <=0.25 SENSITIVE Sensitive     GENTAMICIN <=1 SENSITIVE Sensitive     IMIPENEM <=0.25 SENSITIVE Sensitive     NITROFURANTOIN <=16 SENSITIVE Sensitive     TRIMETH/SULFA <=20 SENSITIVE Sensitive     AMPICILLIN/SULBACTAM 4 SENSITIVE Sensitive      PIP/TAZO <=4 SENSITIVE Sensitive     Extended ESBL NEGATIVE Sensitive     * >=100,000 COLONIES/mL ESCHERICHIA COLI  Urinalysis, Microscopic (reflex)     Status: Abnormal   Collection Time: 10/24/16  5:47 PM  Result Value Ref Range   RBC / HPF 0-5 0 - 5 RBC/hpf   WBC, UA TOO NUMEROUS TO COUNT 0 - 5 WBC/hpf   Bacteria, UA MANY (A) NONE SEEN   Squamous Epithelial / LPF 0-5 (A) NONE SEEN  I-Stat CG4 Lactic Acid, ED     Status: Abnormal   Collection Time: 10/24/16  8:19 PM  Result Value Ref Range   Lactic Acid, Venous 2.39 (HH) 0.5 - 1.9 mmol/L   Comment NOTIFIED PHYSICIAN   MRSA PCR Screening     Status: None   Collection Time: 10/24/16 11:09 PM  Result Value Ref Range   MRSA by PCR NEGATIVE NEGATIVE    Comment:        The GeneXpert MRSA Assay (FDA approved for NASAL specimens only), is one component of a comprehensive MRSA colonization surveillance program. It is not intended to diagnose MRSA infection nor to guide or monitor treatment for MRSA infections.   Respiratory Panel by PCR     Status: None   Collection Time: 10/25/16 12:44 AM  Result Value Ref Range   Adenovirus NOT DETECTED NOT DETECTED   Coronavirus 229E NOT DETECTED NOT DETECTED   Coronavirus HKU1 NOT DETECTED NOT DETECTED  Coronavirus NL63 NOT DETECTED NOT DETECTED   Coronavirus OC43 NOT DETECTED NOT DETECTED   Metapneumovirus NOT DETECTED NOT DETECTED   Rhinovirus / Enterovirus NOT DETECTED NOT DETECTED   Influenza A NOT DETECTED NOT DETECTED   Influenza B NOT DETECTED NOT DETECTED   Parainfluenza Virus 1 NOT DETECTED NOT DETECTED   Parainfluenza Virus 2 NOT DETECTED NOT DETECTED   Parainfluenza Virus 3 NOT DETECTED NOT DETECTED   Parainfluenza Virus 4 NOT DETECTED NOT DETECTED   Respiratory Syncytial Virus NOT DETECTED NOT DETECTED   Bordetella pertussis NOT DETECTED NOT DETECTED   Chlamydophila pneumoniae NOT DETECTED NOT DETECTED   Mycoplasma pneumoniae NOT DETECTED NOT DETECTED  Lactic acid, plasma      Status: None   Collection Time: 10/25/16  1:09 AM  Result Value Ref Range   Lactic Acid, Venous 1.2 0.5 - 1.9 mmol/L  CBC     Status: Abnormal   Collection Time: 10/25/16  1:09 AM  Result Value Ref Range   WBC 9.3 4.0 - 10.5 K/uL   RBC 3.50 (L) 4.22 - 5.81 MIL/uL   Hemoglobin 10.5 (L) 13.0 - 17.0 g/dL   HCT 31.1 (L) 39.0 - 52.0 %   MCV 88.9 78.0 - 100.0 fL   MCH 30.0 26.0 - 34.0 pg   MCHC 33.8 30.0 - 36.0 g/dL   RDW 15.4 11.5 - 15.5 %   Platelets 159 150 - 400 K/uL  Basic metabolic panel     Status: Abnormal   Collection Time: 10/25/16  1:09 AM  Result Value Ref Range   Sodium 137 135 - 145 mmol/L   Potassium 4.4 3.5 - 5.1 mmol/L   Chloride 106 101 - 111 mmol/L   CO2 22 22 - 32 mmol/L   Glucose, Bld 151 (H) 65 - 99 mg/dL   BUN 18 6 - 20 mg/dL   Creatinine, Ser 1.07 0.61 - 1.24 mg/dL   Calcium 8.6 (L) 8.9 - 10.3 mg/dL   GFR calc non Af Amer 59 (L) >60 mL/min   GFR calc Af Amer >60 >60 mL/min    Comment: (NOTE) The eGFR has been calculated using the CKD EPI equation. This calculation has not been validated in all clinical situations. eGFR's persistently <60 mL/min signify possible Chronic Kidney Disease.    Anion gap 9 5 - 15  Procalcitonin     Status: None   Collection Time: 10/25/16  1:09 AM  Result Value Ref Range   Procalcitonin 0.17 ng/mL    Comment:        Interpretation: PCT (Procalcitonin) <= 0.5 ng/mL: Systemic infection (sepsis) is not likely. Local bacterial infection is possible. (NOTE)         ICU PCT Algorithm               Non ICU PCT Algorithm    ----------------------------     ------------------------------         PCT < 0.25 ng/mL                 PCT < 0.1 ng/mL     Stopping of antibiotics            Stopping of antibiotics       strongly encouraged.               strongly encouraged.    ----------------------------     ------------------------------       PCT level decrease by  PCT < 0.25 ng/mL       >= 80% from peak PCT       OR  PCT 0.25 - 0.5 ng/mL          Stopping of antibiotics                                             encouraged.     Stopping of antibiotics           encouraged.    ----------------------------     ------------------------------       PCT level decrease by              PCT >= 0.25 ng/mL       < 80% from peak PCT        AND PCT >= 0.5 ng/mL            Continuin g antibiotics                                              encouraged.       Continuing antibiotics            encouraged.    ----------------------------     ------------------------------     PCT level increase compared          PCT > 0.5 ng/mL         with peak PCT AND          PCT >= 0.5 ng/mL             Escalation of antibiotics                                          strongly encouraged.      Escalation of antibiotics        strongly encouraged.   Lactic acid, plasma     Status: None   Collection Time: 10/25/16  3:56 AM  Result Value Ref Range   Lactic Acid, Venous 1.0 0.5 - 1.9 mmol/L  Glucose, capillary     Status: Abnormal   Collection Time: 10/25/16  1:48 PM  Result Value Ref Range   Glucose-Capillary 129 (H) 65 - 99 mg/dL  Glucose, capillary     Status: Abnormal   Collection Time: 10/25/16  4:52 PM  Result Value Ref Range   Glucose-Capillary 162 (H) 65 - 99 mg/dL  Glucose, capillary     Status: Abnormal   Collection Time: 10/25/16  8:51 PM  Result Value Ref Range   Glucose-Capillary 187 (H) 65 - 99 mg/dL  Glucose, capillary     Status: Abnormal   Collection Time: 10/26/16  7:47 AM  Result Value Ref Range   Glucose-Capillary 165 (H) 65 - 99 mg/dL  Glucose, capillary     Status: Abnormal   Collection Time: 10/26/16 11:45 AM  Result Value Ref Range   Glucose-Capillary 168 (H) 65 - 99 mg/dL  Glucose, capillary     Status: Abnormal   Collection Time: 10/26/16  4:54 PM  Result Value Ref Range   Glucose-Capillary 144 (H) 65 - 99 mg/dL  Glucose, capillary     Status: Abnormal  Collection Time: 10/26/16  9:08  PM  Result Value Ref Range   Glucose-Capillary 129 (H) 65 - 99 mg/dL  CBC     Status: Abnormal   Collection Time: 10/27/16  4:42 AM  Result Value Ref Range   WBC 6.9 4.0 - 10.5 K/uL   RBC 3.66 (L) 4.22 - 5.81 MIL/uL   Hemoglobin 10.9 (L) 13.0 - 17.0 g/dL   HCT 32.1 (L) 39.0 - 52.0 %   MCV 87.7 78.0 - 100.0 fL   MCH 29.8 26.0 - 34.0 pg   MCHC 34.0 30.0 - 36.0 g/dL   RDW 15.5 11.5 - 15.5 %   Platelets 199 150 - 400 K/uL  Basic metabolic panel     Status: Abnormal   Collection Time: 10/27/16  4:42 AM  Result Value Ref Range   Sodium 134 (L) 135 - 145 mmol/L   Potassium 4.2 3.5 - 5.1 mmol/L   Chloride 103 101 - 111 mmol/L   CO2 23 22 - 32 mmol/L   Glucose, Bld 165 (H) 65 - 99 mg/dL   BUN 13 6 - 20 mg/dL   Creatinine, Ser 1.17 0.61 - 1.24 mg/dL   Calcium 9.1 8.9 - 10.3 mg/dL   GFR calc non Af Amer 53 (L) >60 mL/min   GFR calc Af Amer >60 >60 mL/min    Comment: (NOTE) The eGFR has been calculated using the CKD EPI equation. This calculation has not been validated in all clinical situations. eGFR's persistently <60 mL/min signify possible Chronic Kidney Disease.    Anion gap 8 5 - 15  Glucose, capillary     Status: Abnormal   Collection Time: 10/27/16  8:06 AM  Result Value Ref Range   Glucose-Capillary 164 (H) 65 - 99 mg/dL  Glucose, capillary     Status: Abnormal   Collection Time: 10/27/16 11:59 AM  Result Value Ref Range   Glucose-Capillary 166 (H) 65 - 99 mg/dL  Urinalysis, Routine w reflex microscopic     Status: Abnormal   Collection Time: 10/31/16 12:37 AM  Result Value Ref Range   Color, Urine YELLOW YELLOW   APPearance CLEAR CLEAR   Specific Gravity, Urine 1.013 1.005 - 1.030   pH 6.5 5.0 - 8.0   Glucose, UA NEGATIVE NEGATIVE mg/dL   Hgb urine dipstick NEGATIVE NEGATIVE   Bilirubin Urine NEGATIVE NEGATIVE   Ketones, ur NEGATIVE NEGATIVE mg/dL   Protein, ur NEGATIVE NEGATIVE mg/dL   Nitrite NEGATIVE NEGATIVE   Leukocytes, UA TRACE (A) NEGATIVE  CBC with  Differential/Platelet     Status: Abnormal   Collection Time: 10/31/16 12:37 AM  Result Value Ref Range   WBC 10.2 4.0 - 10.5 K/uL   RBC 3.71 (L) 4.22 - 5.81 MIL/uL   Hemoglobin 11.3 (L) 13.0 - 17.0 g/dL   HCT 33.4 (L) 39.0 - 52.0 %   MCV 90.0 78.0 - 100.0 fL   MCH 30.5 26.0 - 34.0 pg   MCHC 33.8 30.0 - 36.0 g/dL   RDW 15.4 11.5 - 15.5 %   Platelets 261 150 - 400 K/uL   Neutrophils Relative % 53 %   Neutro Abs 5.5 1.7 - 7.7 K/uL   Lymphocytes Relative 20 %   Lymphs Abs 2.0 0.7 - 4.0 K/uL   Monocytes Relative 13 %   Monocytes Absolute 1.3 (H) 0.1 - 1.0 K/uL   Eosinophils Relative 13 %   Eosinophils Absolute 1.4 (H) 0.0 - 0.7 K/uL   Basophils Relative 1 %   Basophils Absolute  0.1 0.0 - 0.1 K/uL  Basic metabolic panel     Status: Abnormal   Collection Time: 10/31/16 12:37 AM  Result Value Ref Range   Sodium 133 (L) 135 - 145 mmol/L   Potassium 4.2 3.5 - 5.1 mmol/L   Chloride 101 101 - 111 mmol/L   CO2 23 22 - 32 mmol/L   Glucose, Bld 155 (H) 65 - 99 mg/dL   BUN 17 6 - 20 mg/dL   Creatinine, Ser 1.01 0.61 - 1.24 mg/dL   Calcium 9.3 8.9 - 10.3 mg/dL   GFR calc non Af Amer >60 >60 mL/min   GFR calc Af Amer >60 >60 mL/min    Comment: (NOTE) The eGFR has been calculated using the CKD EPI equation. This calculation has not been validated in all clinical situations. eGFR's persistently <60 mL/min signify possible Chronic Kidney Disease.    Anion gap 9 5 - 15  Urinalysis, Microscopic (reflex)     Status: Abnormal   Collection Time: 10/31/16 12:37 AM  Result Value Ref Range   RBC / HPF NONE SEEN 0 - 5 RBC/hpf   WBC, UA 0-5 0 - 5 WBC/hpf   Bacteria, UA RARE (A) NONE SEEN   Squamous Epithelial / LPF 0-5 (A) NONE SEEN  Blood culture (routine x 2)     Status: None   Collection Time: 10/31/16  2:05 AM  Result Value Ref Range   Specimen Description BLOOD RIGHT FOREARM    Special Requests BOTTLES DRAWN AEROBIC AND ANAEROBIC 5CC EACH    Culture      NO GROWTH 5 DAYS Performed at  [facility], [place] [address] [place], [place] [address]    Report Status 11/05/2016 FINAL   Blood culture (routine x 2)     Status: None   Collection Time: 10/31/16  2:10 AM  Result Value Ref Range   Specimen Description BLOOD LEFT FOREARM    Special Requests BOTTLES DRAWN AEROBIC AND ANAEROBIC 5CC EACH    Culture      NO GROWTH 5 DAYS Performed at [facility], [place] [address], [place], [place] [address]    Report Status 11/05/2016 FINAL   Glucose, capillary     Status: Abnormal   Collection Time: 10/31/16  4:48 PM  Result Value Ref Range   Glucose-Capillary 146 (H) 65 - 99 mg/dL  Strep pneumoniae urinary antigen     Status: None   Collection Time: 10/31/16  6:30 PM  Result Value Ref Range   Strep Pneumo Urinary Antigen NEGATIVE NEGATIVE    Comment:        Infection due to S. pneumoniae cannot be absolutely ruled out since the antigen present may be below the detection limit of the test.   Glucose, capillary     Status: Abnormal   Collection Time: 10/31/16 10:17 PM  Result Value Ref Range   Glucose-Capillary 135 (H) 65 - 99 mg/dL  Basic metabolic panel     Status: Abnormal   Collection Time: 11/01/16  5:23 AM  Result Value Ref Range   Sodium 133 (L) 135 - 145 mmol/L   Potassium 4.2 3.5 - 5.1 mmol/L   Chloride 100 (L) 101 - 111 mmol/L   CO2 22 22 - 32 mmol/L   Glucose, Bld 169 (H) 65 - 99 mg/dL   BUN 15 6 - 20 mg/dL   Creatinine, Ser 1.15 0.61 - 1.24 mg/dL   Calcium 9.2 8.9 - 10.3 mg/dL   GFR calc non Af  Amer 54 (L) >60 mL/min   GFR calc Af Amer >60 >60 mL/min    Comment: (NOTE) The eGFR has been calculated using the CKD EPI equation. This calculation has not been validated in all clinical situations. eGFR's persistently <60 mL/min signify possible Chronic Kidney Disease.    Anion gap 11 5 - 15  CBC     Status: Abnormal   Collection Time: 11/01/16  5:23 AM  Result Value Ref Range   WBC 8.5 4.0 - 10.5 K/uL   RBC 3.82 (L) 4.22 - 5.81 MIL/uL    Hemoglobin 11.2 (L) 13.0 - 17.0 g/dL   HCT 33.8 (L) 39.0 - 52.0 %   MCV 88.5 78.0 - 100.0 fL   MCH 29.3 26.0 - 34.0 pg   MCHC 33.1 30.0 - 36.0 g/dL   RDW 15.6 (H) 11.5 - 15.5 %   Platelets 243 150 - 400 K/uL  Glucose, capillary     Status: Abnormal   Collection Time: 11/01/16  8:09 AM  Result Value Ref Range   Glucose-Capillary 155 (H) 65 - 99 mg/dL  Glucose, capillary     Status: Abnormal   Collection Time: 11/01/16 12:39 PM  Result Value Ref Range   Glucose-Capillary 182 (H) 65 - 99 mg/dL  Glucose, capillary     Status: Abnormal   Collection Time: 11/01/16  5:22 PM  Result Value Ref Range   Glucose-Capillary 181 (H) 65 - 99 mg/dL  Glucose, capillary     Status: Abnormal   Collection Time: 11/01/16  9:16 PM  Result Value Ref Range   Glucose-Capillary 144 (H) 65 - 99 mg/dL  Glucose, capillary     Status: Abnormal   Collection Time: 11/02/16  8:09 AM  Result Value Ref Range   Glucose-Capillary 170 (H) 65 - 99 mg/dL  Glucose, capillary     Status: Abnormal   Collection Time: 11/02/16 12:43 PM  Result Value Ref Range   Glucose-Capillary 181 (H) 65 - 99 mg/dL  Glucose, capillary     Status: Abnormal   Collection Time: 11/02/16  5:13 PM  Result Value Ref Range   Glucose-Capillary 139 (H) 65 - 99 mg/dL  Glucose, capillary     Status: Abnormal   Collection Time: 11/02/16  9:20 PM  Result Value Ref Range   Glucose-Capillary 184 (H) 65 - 99 mg/dL  Glucose, capillary     Status: Abnormal   Collection Time: 11/03/16  7:42 AM  Result Value Ref Range   Glucose-Capillary 192 (H) 65 - 99 mg/dL  Glucose, capillary     Status: Abnormal   Collection Time: 11/03/16 12:31 PM  Result Value Ref Range   Glucose-Capillary 170 (H) 65 - 99 mg/dL  CULTURE, URINE COMPREHENSIVE     Status: None   Collection Time: 11/11/16 12:26 PM  Result Value Ref Range   Organism ID, Bacteria NO GROWTH   CBC     Status: Abnormal   Collection Time: 11/11/16 12:30 PM  Result Value Ref Range   WBC 11.4 (H)  4.0 - 10.5 K/uL   RBC 4.28 4.22 - 5.81 Mil/uL   Platelets 323.0 150.0 - 400.0 K/uL   Hemoglobin 12.7 (L) 13.0 - 17.0 g/dL   HCT 38.5 (L) 39.0 - 52.0 %   MCV 89.9 78.0 - 100.0 fl   MCHC 32.9 30.0 - 36.0 g/dL   RDW 16.1 (H) 11.5 - 88.2 %  Basic metabolic panel     Status: Abnormal   Collection Time: 11/11/16 12:30 PM  Result  Value Ref Range   Sodium 136 135 - 145 mEq/L   Potassium 5.2 (H) 3.5 - 5.1 mEq/L   Chloride 98 96 - 112 mEq/L   CO2 25 19 - 32 mEq/L   Glucose, Bld 122 (H) 70 - 99 mg/dL   BUN 25 (H) 6 - 23 mg/dL   Creatinine, Ser 1.15 0.40 - 1.50 mg/dL   Calcium 10.0 8.4 - 10.5 mg/dL   GFR 63.35 >60.00 mL/min  POCT Urinalysis Dipstick     Status: Abnormal   Collection Time: 11/18/16 11:40 AM  Result Value Ref Range   Color, UA yellow    Clarity, UA cloudy    Glucose, UA negative    Bilirubin, UA negative    Ketones, UA negative    Spec Grav, UA 1.015    Blood, UA negative    pH, UA 6.0    Protein, UA negative    Urobilinogen, UA 0.2    Nitrite, UA negative    Leukocytes, UA large (3+) (A) Negative  Basic metabolic panel     Status: Abnormal   Collection Time: 11/18/16 12:01 PM  Result Value Ref Range   Sodium 137 135 - 145 mEq/L   Potassium 5.4 (H) 3.5 - 5.1 mEq/L   Chloride 99 96 - 112 mEq/L   CO2 27 19 - 32 mEq/L   Glucose, Bld 143 (H) 70 - 99 mg/dL   BUN 14 6 - 23 mg/dL   Creatinine, Ser 0.99 0.40 - 1.50 mg/dL   Calcium 9.6 8.4 - 10.5 mg/dL   GFR 75.31 >60.00 mL/min  CBC     Status: Abnormal   Collection Time: 11/18/16 12:01 PM  Result Value Ref Range   WBC 9.4 4.0 - 10.5 K/uL   RBC 3.88 (L) 4.22 - 5.81 Mil/uL   Platelets 215.0 150.0 - 400.0 K/uL   Hemoglobin 11.7 (L) 13.0 - 17.0 g/dL   HCT 34.5 (L) 39.0 - 52.0 %   MCV 89.0 78.0 - 100.0 fl   MCHC 33.9 30.0 - 36.0 g/dL   RDW 15.9 (H) 11.5 - 15.5 %  Urine Microalbumin w/creat. ratio     Status: Abnormal   Collection Time: 11/18/16 12:34 PM  Result Value Ref Range   Microalb, Ur 3.9 (H) 0.0 - 1.9 mg/dL     Creatinine,U 35.1 mg/dL   Microalb Creat Ratio 11.1 0.0 - 30.0 mg/g    Assessment/Plan: 1. UTI symptoms Urine dip with large LE and cloudy urine. Giving history and risk of sepsis, AMS with untreated UTI, will start Keflex while awaiting culture results. Strict precautions given to patient and wife that would prompt need for ER assessment. - POCT Urinalysis Dipstick - Urine Culture  2. Uncontrolled type 2 diabetes mellitus without complication, without long-term current use of insulin (HCC) Repeat CBC and BMP today. - Basic metabolic panel - CBC - Urine Microalbumin w/creat. ratio   Leeanne Rio, [place]

## 2016-11-20 ENCOUNTER — Encounter: Payer: Self-pay | Admitting: Physician Assistant

## 2016-11-20 ENCOUNTER — Other Ambulatory Visit: Payer: Self-pay | Admitting: Emergency Medicine

## 2016-11-20 ENCOUNTER — Other Ambulatory Visit: Payer: Self-pay | Admitting: General Practice

## 2016-11-20 DIAGNOSIS — E875 Hyperkalemia: Secondary | ICD-10-CM

## 2016-11-20 LAB — URINE CULTURE: Organism ID, Bacteria: NO GROWTH

## 2016-11-20 MED ORDER — GLUCOSE BLOOD VI STRP: ORAL_STRIP | 12 refills | Status: DC

## 2016-11-21 ENCOUNTER — Encounter: Payer: Self-pay | Admitting: Physician Assistant

## 2016-11-21 ENCOUNTER — Other Ambulatory Visit (INDEPENDENT_AMBULATORY_CARE_PROVIDER_SITE_OTHER): Payer: Medicare Other

## 2016-11-21 DIAGNOSIS — I69398 Other sequelae of cerebral infarction: Secondary | ICD-10-CM | POA: Diagnosis not present

## 2016-11-21 DIAGNOSIS — E875 Hyperkalemia: Secondary | ICD-10-CM

## 2016-11-21 DIAGNOSIS — R2689 Other abnormalities of gait and mobility: Secondary | ICD-10-CM | POA: Diagnosis not present

## 2016-11-21 DIAGNOSIS — Z5181 Encounter for therapeutic drug level monitoring: Secondary | ICD-10-CM | POA: Diagnosis not present

## 2016-11-21 DIAGNOSIS — N390 Urinary tract infection, site not specified: Secondary | ICD-10-CM | POA: Diagnosis not present

## 2016-11-21 DIAGNOSIS — F0390 Unspecified dementia without behavioral disturbance: Secondary | ICD-10-CM | POA: Diagnosis not present

## 2016-11-21 DIAGNOSIS — I69354 Hemiplegia and hemiparesis following cerebral infarction affecting left non-dominant side: Secondary | ICD-10-CM | POA: Diagnosis not present

## 2016-11-21 DIAGNOSIS — E1165 Type 2 diabetes mellitus with hyperglycemia: Secondary | ICD-10-CM | POA: Diagnosis not present

## 2016-11-22 ENCOUNTER — Encounter: Payer: Self-pay | Admitting: Physician Assistant

## 2016-11-22 DIAGNOSIS — Z978 Presence of other specified devices: Secondary | ICD-10-CM | POA: Insufficient documentation

## 2016-11-22 DIAGNOSIS — Z960 Presence of urogenital implants: Secondary | ICD-10-CM

## 2016-11-22 DIAGNOSIS — N400 Enlarged prostate without lower urinary tract symptoms: Secondary | ICD-10-CM | POA: Insufficient documentation

## 2016-11-22 DIAGNOSIS — R2689 Other abnormalities of gait and mobility: Secondary | ICD-10-CM | POA: Diagnosis not present

## 2016-11-22 DIAGNOSIS — Z87440 Personal history of urinary (tract) infections: Secondary | ICD-10-CM | POA: Diagnosis not present

## 2016-11-22 DIAGNOSIS — E1165 Type 2 diabetes mellitus with hyperglycemia: Secondary | ICD-10-CM | POA: Diagnosis not present

## 2016-11-22 DIAGNOSIS — R339 Retention of urine, unspecified: Secondary | ICD-10-CM | POA: Diagnosis not present

## 2016-11-22 DIAGNOSIS — N189 Chronic kidney disease, unspecified: Secondary | ICD-10-CM | POA: Diagnosis not present

## 2016-11-22 DIAGNOSIS — Z789 Other specified health status: Secondary | ICD-10-CM | POA: Diagnosis not present

## 2016-11-22 DIAGNOSIS — I69398 Other sequelae of cerebral infarction: Secondary | ICD-10-CM | POA: Diagnosis not present

## 2016-11-22 LAB — CBC WITH DIFFERENTIAL/PLATELET
Basophils Absolute: 0.1 10*3/uL (ref 0.0–0.1)
Basophils Relative: 1 % (ref 0.0–3.0)
EOS PCT: 9.8 % — AB (ref 0.0–5.0)
Eosinophils Absolute: 0.9 10*3/uL — ABNORMAL HIGH (ref 0.0–0.7)
HCT: 35.2 % — ABNORMAL LOW (ref 39.0–52.0)
HEMOGLOBIN: 11.9 g/dL — AB (ref 13.0–17.0)
Lymphocytes Relative: 30.2 % (ref 12.0–46.0)
Lymphs Abs: 2.8 10*3/uL (ref 0.7–4.0)
MCHC: 33.9 g/dL (ref 30.0–36.0)
MCV: 87.7 fl (ref 78.0–100.0)
MONO ABS: 0.9 10*3/uL (ref 0.1–1.0)
MONOS PCT: 9.2 % (ref 3.0–12.0)
Neutro Abs: 4.7 10*3/uL (ref 1.4–7.7)
Neutrophils Relative %: 49.8 % (ref 43.0–77.0)
Platelets: 196 10*3/uL (ref 150.0–400.0)
RBC: 4.01 Mil/uL — AB (ref 4.22–5.81)
RDW: 16 % — ABNORMAL HIGH (ref 11.5–15.5)
WBC: 9.4 10*3/uL (ref 4.0–10.5)

## 2016-11-22 LAB — HEPATIC FUNCTION PANEL
ALT: 13 U/L (ref 0–53)
AST: 18 U/L (ref 0–37)
Albumin: 3.7 g/dL (ref 3.5–5.2)
Alkaline Phosphatase: 41 U/L (ref 39–117)
Bilirubin, Direct: 0.1 mg/dL (ref 0.0–0.3)
Total Bilirubin: 0.3 mg/dL (ref 0.2–1.2)
Total Protein: 6.9 g/dL (ref 6.0–8.3)

## 2016-11-22 LAB — BASIC METABOLIC PANEL
BUN: 20 mg/dL (ref 6–23)
CALCIUM: 9.7 mg/dL (ref 8.4–10.5)
CO2: 28 mEq/L (ref 19–32)
Chloride: 96 mEq/L (ref 96–112)
Creatinine, Ser: 1.05 mg/dL (ref 0.40–1.50)
GFR: 70.36 mL/min (ref >60.00)
GLUCOSE: 90 mg/dL (ref 70–99)
Potassium: 5.1 mEq/L (ref 3.5–5.1)
Sodium: 133 mEq/L — ABNORMAL LOW (ref 135–145)

## 2016-11-22 LAB — C-REACTIVE PROTEIN: CRP: 0.1 mg/dL — ABNORMAL LOW (ref 0.5–20.0)

## 2016-11-26 DIAGNOSIS — I69354 Hemiplegia and hemiparesis following cerebral infarction affecting left non-dominant side: Secondary | ICD-10-CM | POA: Diagnosis not present

## 2016-11-26 DIAGNOSIS — F0390 Unspecified dementia without behavioral disturbance: Secondary | ICD-10-CM | POA: Diagnosis not present

## 2016-11-26 DIAGNOSIS — I69398 Other sequelae of cerebral infarction: Secondary | ICD-10-CM | POA: Diagnosis not present

## 2016-11-26 DIAGNOSIS — N390 Urinary tract infection, site not specified: Secondary | ICD-10-CM | POA: Diagnosis not present

## 2016-11-26 DIAGNOSIS — R2689 Other abnormalities of gait and mobility: Secondary | ICD-10-CM | POA: Diagnosis not present

## 2016-11-26 DIAGNOSIS — E1165 Type 2 diabetes mellitus with hyperglycemia: Secondary | ICD-10-CM | POA: Diagnosis not present

## 2016-11-27 ENCOUNTER — Ambulatory Visit (INDEPENDENT_AMBULATORY_CARE_PROVIDER_SITE_OTHER): Payer: Medicare Other | Admitting: Physician Assistant

## 2016-11-27 ENCOUNTER — Encounter: Payer: Self-pay | Admitting: Physician Assistant

## 2016-11-27 VITALS — BP 116/60 | HR 62 | Temp 97.5°F | Resp 14 | Ht 66.0 in | Wt 158.0 lb

## 2016-11-27 DIAGNOSIS — Z0000 Encounter for general adult medical examination without abnormal findings: Secondary | ICD-10-CM

## 2016-11-27 NOTE — Progress Notes (Signed)
Subjective:   Gerald Holt is a 83 y.o. male who presents for an Initial Medicare Annual Wellness Visit.  Review of Systems  No ROS.  Medicare Wellness Visit.  Cardiac Risk Factors include: advanced age (>3men, >73 women);diabetes mellitus;dyslipidemia;family history of premature cardiovascular disease;male gender   Sleep patterns: Sleeps about 9 hours, uninterrupted.  Home Safety/Smoke Alarms:  Smoke detectors and security in place.  Living environment; residence and Firearm Safety: Lives with wife and sister in law in 1 story home. One step going into home.  Seat Belt Safety/Bike Helmet: Wears seat belt.   Counseling:   Eye Exam-last exam 09/2016, every 6 months. [facility]-Last 08/2016, every 6 months. Dr Mee Hives.    Male:   CCS- colonoscopy > 10 years per patient.     PSA-N/A. Followed by Urology.     Objective:    Today's Vitals   11/27/16 1115  BP: 116/60  Pulse: 62  Resp: 14  Temp: 97.5 F (36.4 C)  TempSrc: Oral  SpO2: 95%  Weight: 158 lb (71.7 kg)  Height: 5\' 6"  (1.676 m)   Body mass index is 25.5 kg/m.  Current Medications (verified) Outpatient Encounter Prescriptions as of 11/27/2016  Medication Sig  . aspirin 81 MG tablet Take 1 tablet (81 mg total) by mouth daily.  . Blood Glucose Monitoring Suppl (RELION PRIME MONITOR) DEVI Use to check fasting sugars daily. E 11.9  . Calcium Carbonate Antacid (ALKA-SELTZER ANTACID PO) Take 2 tablets by mouth as needed (for indegestion).   . cyanocobalamin (,VITAMIN B-12,) 1000 MCG/ML injection Inject 1000 mcg intramuscular daily for 7 days, then once a week for 4 weeks, then once a month for a year (Patient taking differently: Inject 1,000 mcg into the muscle every 30 (thirty) days. )  . docusate sodium (COLACE) 100 MG capsule Take 100-200 mg by mouth See admin instructions. Take 100 mg by mouth daily for 2 days, then on every third day take 200 mg by mouth daily. Alternate 100 mg and 200 mg  . ELDERBERRY PO Take 1  tablet by mouth daily.  . Eyelid Cleansers (STERILID EX) Apply 1 application topically See admin instructions. Eye Wash: Once Daily   . glipiZIDE (GLUCOTROL XL) 2.5 MG 24 hr tablet Take 1 tablet (2.5 mg total) by mouth daily with breakfast.  . glucose blood test strip One Touch Verio strips Use as instructed to check fasting sugars once daily .Dx:E11.9  . levothyroxine (SYNTHROID, LEVOTHROID) 50 MCG tablet Take 1 tablet (50 mcg total) by mouth daily.  Marland Kitchen lovastatin (MEVACOR) 10 MG tablet Take 5 mg by mouth at bedtime.   . metFORMIN (GLUCOPHAGE-XR) 500 MG 24 hr tablet Take 2 tablets with breakfast and at bedtime (Patient taking differently: Take 1,000 mg by mouth 2 (two) times daily. )  . metoprolol tartrate (LOPRESSOR) 25 MG tablet Take 0.5 tablets (12.5 mg total) by mouth 2 (two) times daily.  . Miconazole Nitrate (TRIPLE PASTE AF) 2 % OINT Apply 1 application topically once a week.   . Omega-3 Fatty Acids (SB OMEGA-3 FISH OIL) 1000 MG CAPS Take 1,000 mg by mouth 2 (two) times daily. Reported on 10/06/2015  . OVER THE COUNTER MEDICATION Take 1 capsule by mouth 2 (two) times daily. OTC Focus Select  . Probiotic Product (PROBIOTIC-10 PO) Take 1 capsule by mouth daily.  Marland Kitchen triamcinolone cream (KENALOG) 0.1 % Apply 1 application topically as needed (for itching).   . [DISCONTINUED] cephALEXin (KEFLEX) 500 MG capsule Take 1 capsule (500 mg total) by  mouth 2 (two) times daily.   No facility-administered encounter medications on file as of 11/27/2016.     Allergies (verified) Flomax [tamsulosin hcl]; Shellfish allergy; Aspirin-dipyridamole er; and Other   History: Past Medical History:  Diagnosis Date  . A-fib ([place])   . Cataracts, bilateral   . Chronic dermatitis   . Diabetes type 2, controlled ([place]) 2000  . History of chicken pox   . Mumps    Adult  . Retinopathy   . Shingles   . Stroke [facility]) Nov 2011  . Undescended testicle, unilateral    Right  . Whooping cough    Past Surgical History:   Procedure Laterality Date  . CARDIAC VALVE REPLACEMENT    . PRE-MALIGNANT / BENIGN SKIN LESION EXCISION    . TESTICLE SURGERY     Right, undescended  . TOOTH EXTRACTION     Family History  Problem Relation Age of Onset  . Pneumonia Mother 67    Deceased  . GI Bleed Father 88    Deceased - Ulcers  . Diabetes Cousin   . Diabetes Brother   . Cancer Paternal Aunt    Social History   Occupational History  . Not on file.   Social History Main Topics  . Smoking status: Former Research scientist (life sciences)  . Smokeless tobacco: Never Used     Comment: Hasn't smoked since age 71  . Alcohol use No  . Drug use: No  . Sexual activity: Not on file   Tobacco Counseling Counseling given: Not Answered   Activities of Daily Living In your present state of health, do you have any difficulty performing the following activities: 11/27/2016 10/31/2016  Hearing? N -  Vision? N -  Difficulty concentrating or making decisions? Y -  Walking or climbing stairs? Y -  Dressing or bathing? N -  Doing errands, shopping? Tempie Donning  Preparing Food and eating ? N -  Using the Toilet? N -  In the past six months, have you accidently leaked urine? N -  Do you have problems with loss of bowel control? N -  Managing your Medications? Y -  Managing your Finances? Y -  Housekeeping or managing your Housekeeping? N -  Some recent data might be hidden    Immunizations and Health Maintenance Immunization History  Administered Date(s) Administered  . Influenza,inj,Quad PF,36+ Mos 06/21/2016   Health Maintenance Due  Topic Date Due  . FOOT EXAM  10/05/2016    Patient Care Team: [name], PA-C as PCP - General (Physician Assistant) Pieter Partridge, DO as Consulting Physician (Neurology) Jarome Matin, MD as Referring Physician (Urology) Ranee Gosselin, MD as Referring Physician (Infectious Diseases) Trula Slade, DPM as Consulting Physician (Podiatry) Sheryn Bison, MD as Referring Physician  (Dermatology) Calvert Cantor, MD as Consulting Physician (Ophthalmology)  Indicate any recent Medical Services you may have received from other than [facility] providers in the past year (date may be approximate).    Assessment:   This is a routine wellness examination for [place]. Physical assessment deferred to PCP.   Hearing/Vision screen Hearing Screening Comments: Hearing aids bilaterally  Vision Screening Comments: Wears glasses to watch tv.   Dietary issues and exercise activities discussed: Current Exercise Habits: Home exercise routine (Caregiver assist with PT exercises and walking), Type of exercise: strength training/weights;walking, Time (Minutes): 30, Frequency (Times/Week): 7, Weekly Exercise (Minutes/Week): 210, Exercise limited by: neurologic condition(s)   Diet (meal preparation, eat out, water intake, caffeinated beverages, dairy products, fruits  and vegetables): Drinks water.   Breakfast: cake, ham, black coffee Lunch: soup, peanut butter crackers Dinner: fruit smoothie, occasional vegetable. Glucerna shakes 2 x day.     Encouraged to continue eating at least 3 meals/day and water intake.   Goals      Patient Stated   . patient  (pt-stated)          "Play the piano". 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       Depression Screen PHQ 2/9 Scores 11/27/2016 10/14/2016 10/06/2015  PHQ - 2 Score 0 0 0    Fall Risk Fall Risk  11/27/2016 10/14/2016 09/25/2016 03/25/2016 10/06/2015  Falls in the past year? Yes No Yes Yes Yes  Number falls in past yr: 2 or more - 2 or more 2 or more 2 or more  Injury with Fall? Yes - Yes Yes Yes  Risk Factor Category  - - High Fall Risk High Fall Risk -  Risk for fall due to : Impaired balance/gait Impaired balance/gait - History of fall(s);Impaired balance/gait;Impaired mobility -  Follow up Falls prevention discussed - - Falls evaluation completed;Education provided;Falls prevention discussed -    Cognitive Function: MMSE - Mini Mental  State Exam 11/27/2016 09/25/2016 03/25/2016  Orientation to time 4 4 2   Orientation to Place 5 3 2   Registration 3 3 3   Attention/ Calculation 5 3 0  Recall 2 1 3   Language- name 2 objects 2 2 2   Language- repeat 1 1 1   Language- follow 3 step command 3 3 3   Language- read & follow direction 1 1 1   Write a sentence 1 1 0  Copy design 1 0 0  Total score 28 22 17         Screening Tests Health Maintenance  Topic Date Due  . FOOT EXAM  10/05/2016  . DTaP/Tdap/Td (1 - Tdap) 06/23/2017 (Originally 11/03/1942)  . TETANUS/TDAP  06/23/2017 (Originally 11/03/1942)  . OPHTHALMOLOGY EXAM  02/21/2017  . HEMOGLOBIN A1C  04/13/2017  . URINE MICROALBUMIN  11/18/2017  . INFLUENZA VACCINE  Completed  . PNA vac Low Risk Adult  Addressed   Appointment with Podiatry scheduled next week.      Plan:     Continue doing brain stimulating activities (puzzles, reading, adult coloring books, staying active) to keep memory sharp.    During the course of the visit [place] was educated and counseled about the following appropriate screening and preventive services:   Vaccines to include Pneumoccal, Influenza, Hepatitis B, Td, Zostavax, HCV  Electrocardiogram  Colorectal cancer screening  Cardiovascular disease screening  Diabetes screening  Glaucoma screening  Nutrition counseling  Prostate cancer screening   Patient Instructions (the written plan) were given to the patient.   Gerilyn Nestle, RN   11/27/2016

## 2016-11-27 NOTE — Patient Instructions (Addendum)
Continue doing brain stimulating activities (puzzles, reading, adult coloring books, staying active) to keep memory sharp.     Fall Prevention in the Home Falls can cause injuries. They can happen to people of all ages. There are many things you can do to make your home safe and to help prevent falls. What can I do on the outside of my home?  Regularly fix the edges of walkways and driveways and fix any cracks.  Remove anything that might make you trip as you walk through a door, such as a raised step or threshold.  Trim any bushes or trees on the path to your home.  Use bright outdoor lighting.  Clear any walking paths of anything that might make someone trip, such as rocks or tools.  Regularly check to see if handrails are loose or broken. Make sure that both sides of any steps have handrails.  Any raised decks and porches should have guardrails on the edges.  Have any leaves, snow, or ice cleared regularly.  Use sand or salt on walking paths during winter.  Clean up any spills in your garage right away. This includes oil or grease spills. What can I do in the bathroom?  Use night lights.  Install grab bars by the toilet and in the tub and shower. Do not use towel bars as grab bars.  Use non-skid mats or decals in the tub or shower.  If you need to sit down in the shower, use a plastic, non-slip stool.  Keep the floor dry. Clean up any water that spills on the floor as soon as it happens.  Remove soap buildup in the tub or shower regularly.  Attach bath mats securely with double-sided non-slip rug tape.  Do not have throw rugs and other things on the floor that can make you trip. What can I do in the bedroom?  Use night lights.  Make sure that you have a light by your bed that is easy to reach.  Do not use any sheets or blankets that are too big for your bed. They should not hang down onto the floor.  Have a firm chair that has side arms. You can use this for  support while you get dressed.  Do not have throw rugs and other things on the floor that can make you trip. What can I do in the kitchen?  Clean up any spills right away.  Avoid walking on wet floors.  Keep items that you use a lot in easy-to-reach places.  If you need to reach something above you, use a strong step stool that has a grab bar.  Keep electrical cords out of the way.  Do not use floor polish or wax that makes floors slippery. If you must use wax, use non-skid floor wax.  Do not have throw rugs and other things on the floor that can make you trip. What can I do with my stairs?  Do not leave any items on the stairs.  Make sure that there are handrails on both sides of the stairs and use them. Fix handrails that are broken or loose. Make sure that handrails are as long as the stairways.  Check any carpeting to make sure that it is firmly attached to the stairs. Fix any carpet that is loose or worn.  Avoid having throw rugs at the top or bottom of the stairs. If you do have throw rugs, attach them to the floor with carpet tape.  Make sure that  you have a light switch at the top of the stairs and the bottom of the stairs. If you do not have them, ask someone to add them for you. What else can I do to help prevent falls?  Wear shoes that:  Do not have high heels.  Have rubber bottoms.  Are comfortable and fit you well.  Are closed at the toe. Do not wear sandals.  If you use a stepladder:  Make sure that it is fully opened. Do not climb a closed stepladder.  Make sure that both sides of the stepladder are locked into place.  Ask someone to hold it for you, if possible.  Clearly mark and make sure that you can see:  Any grab bars or handrails.  First and last steps.  Where the edge of each step is.  Use tools that help you move around (mobility aids) if they are needed. These include:  Canes.  Walkers.  Scooters.  Crutches.  Turn on the  lights when you go into a dark area. Replace any light bulbs as soon as they burn out.  Set up your furniture so you have a clear path. Avoid moving your furniture around.  If any of your floors are uneven, fix them.  If there are any pets around you, be aware of where they are.  Review your medicines with your doctor. Some medicines can make you feel dizzy. This can increase your chance of falling. Ask your doctor what other things that you can do to help prevent falls. This information is not intended to replace advice given to you by your health care provider. Make sure you discuss any questions you have with your health care provider. Document Released: 07/06/2009 Document Revised: 02/15/2016 Document Reviewed: 10/14/2014 Elsevier Interactive Patient Education  2017 [employer], Male A healthy lifestyle and preventive care is important for your health and wellness. Ask your health care provider about what schedule of regular examinations is right for you. What should I know about weight and diet?  Eat a Healthy Diet  Eat plenty of vegetables, fruits, whole grains, low-fat dairy products, and lean protein.  Do not eat a lot of foods high in solid fats, added sugars, or salt. Maintain a Healthy Weight  Regular exercise can help you achieve or maintain a healthy weight. You should:  Do at least 150 minutes of exercise each week. The exercise should increase your heart rate and make you sweat (moderate-intensity exercise).  Do strength-training exercises at least twice a week. Watch Your Levels of Cholesterol and Blood Lipids  Have your blood tested for lipids and cholesterol every 5 years starting at 81 years of age. If you are at high risk for heart disease, you should start having your blood tested when you are 81 years old. You may need to have your cholesterol levels checked more often if:  Your lipid or cholesterol levels are high.  You are older than  81 years of age.  You are at high risk for heart disease. What should I know about cancer screening? Many types of cancers can be detected early and may often be prevented. Lung Cancer  You should be screened every year for lung cancer if:  You are a current smoker who has smoked for at least 30 years.  You are a former smoker who has quit within the past 15 years.  Talk to your health care provider about your screening options, when you should start screening,  and how often you should be screened. Colorectal Cancer  Routine colorectal cancer screening usually begins at 81 years of age and should be repeated every 5-10 years until you are 81 years old. You may need to be screened more often if early forms of precancerous polyps or small growths are found. Your health care provider may recommend screening at an earlier age if you have risk factors for colon cancer.  Your health care provider may recommend using home test kits to check for hidden blood in the stool.  A small camera at the end of a tube can be used to examine your colon (sigmoidoscopy or colonoscopy). This checks for the earliest forms of colorectal cancer. Prostate and Testicular Cancer  Depending on your age and overall health, your health care provider may do certain tests to screen for prostate and testicular cancer.  Talk to your health care provider about any symptoms or concerns you have about testicular or prostate cancer. Skin Cancer  Check your skin from head to toe regularly.  Tell your health care provider about any new moles or changes in moles, especially if:  There is a change in a mole's size, shape, or color.  You have a mole that is larger than a pencil eraser.  Always use sunscreen. Apply sunscreen liberally and repeat throughout the day.  Protect yourself by wearing long sleeves, pants, a wide-brimmed hat, and sunglasses when outside. What should I know about heart disease, diabetes, and high  blood pressure?  If you are 18-69 years of age, have your blood pressure checked every 3-5 years. If you are 51 years of age or older, have your blood pressure checked every year. You should have your blood pressure measured twice-once when you are at a hospital or clinic, and once when you are not at a hospital or clinic. Record the average of the two measurements. To check your blood pressure when you are not at a hospital or clinic, you can use:  An automated blood pressure machine at a pharmacy.  A home blood pressure monitor.  Talk to your health care provider about your target blood pressure.  If you are between 54-63 years old, ask your health care provider if you should take aspirin to prevent heart disease.  Have regular diabetes screenings by checking your fasting blood sugar level.  If you are at a normal weight and have a low risk for diabetes, have this test once every three years after the age of 38.  If you are overweight and have a high risk for diabetes, consider being tested at a younger age or more often.  A one-time screening for abdominal aortic aneurysm (AAA) by ultrasound is recommended for men aged 71-75 years who are current or former smokers. What should I know about preventing infection? Hepatitis B  If you have a higher risk for hepatitis B, you should be screened for this virus. Talk with your health care provider to find out if you are at risk for hepatitis B infection. Hepatitis C  Blood testing is recommended for:  Everyone born from [birth year] through 1963.  Anyone with known risk factors for hepatitis C. Sexually Transmitted Diseases (STDs)  You should be screened each year for STDs including gonorrhea and chlamydia if:  You are sexually active and are younger than 81 years of age.  You are older than 81 years of age and your health care provider tells you that you are at risk for this type of infection.  Your sexual activity has changed since you were  last screened and you are at an increased risk for chlamydia or gonorrhea. Ask your health care provider if you are at risk.  Talk with your health care provider about whether you are at high risk of being infected with HIV. Your health care provider may recommend a prescription medicine to help prevent HIV infection. What else can I do?  Schedule regular health, dental, and eye exams.  Stay current with your vaccines (immunizations).  Do not use any tobacco products, such as cigarettes, chewing tobacco, and e-cigarettes. If you need help quitting, ask your health care provider.  Limit alcohol intake to no more than 2 drinks per day. One drink equals 12 ounces of beer, 5 ounces of wine, or 1 ounces of hard liquor.  Do not use street drugs.  Do not share needles.  Ask your health care provider for help if you need support or information about quitting drugs.  Tell your health care provider if you often feel depressed.  Tell your health care provider if you have ever been abused or do not feel safe at home. This information is not intended to replace advice given to you by your health care provider. Make sure you discuss any questions you have with your health care provider. Document Released: 03/07/2008 Document Revised: 05/08/2016 Document Reviewed: 06/13/2015 Elsevier Interactive Patient Education  2017 [employer].

## 2016-11-27 NOTE — Progress Notes (Signed)
Pre visit review using our clinic review tool, if applicable. No additional management support is needed unless otherwise documented below in the visit note. 

## 2016-11-28 DIAGNOSIS — I69398 Other sequelae of cerebral infarction: Secondary | ICD-10-CM | POA: Diagnosis not present

## 2016-11-28 DIAGNOSIS — R2689 Other abnormalities of gait and mobility: Secondary | ICD-10-CM | POA: Diagnosis not present

## 2016-11-28 DIAGNOSIS — I69354 Hemiplegia and hemiparesis following cerebral infarction affecting left non-dominant side: Secondary | ICD-10-CM | POA: Diagnosis not present

## 2016-11-28 DIAGNOSIS — E1165 Type 2 diabetes mellitus with hyperglycemia: Secondary | ICD-10-CM | POA: Diagnosis not present

## 2016-11-28 DIAGNOSIS — N390 Urinary tract infection, site not specified: Secondary | ICD-10-CM | POA: Diagnosis not present

## 2016-11-28 DIAGNOSIS — F0390 Unspecified dementia without behavioral disturbance: Secondary | ICD-10-CM | POA: Diagnosis not present

## 2016-11-28 NOTE — Progress Notes (Signed)
RN [place]e note reviewed. Agree with plan. Patient scheduled for FU with me regarding Diabetes. Will perform foot examination at that time.   Leeanne Rio, PA-C

## 2016-12-01 ENCOUNTER — Encounter: Payer: Self-pay | Admitting: Physician Assistant

## 2016-12-03 ENCOUNTER — Ambulatory Visit (INDEPENDENT_AMBULATORY_CARE_PROVIDER_SITE_OTHER): Payer: Medicare Other | Admitting: Podiatry

## 2016-12-03 ENCOUNTER — Encounter: Payer: Self-pay | Admitting: Podiatry

## 2016-12-03 DIAGNOSIS — I739 Peripheral vascular disease, unspecified: Secondary | ICD-10-CM | POA: Diagnosis not present

## 2016-12-03 DIAGNOSIS — B351 Tinea unguium: Secondary | ICD-10-CM

## 2016-12-03 DIAGNOSIS — M79674 Pain in right toe(s): Secondary | ICD-10-CM | POA: Diagnosis not present

## 2016-12-03 DIAGNOSIS — M79675 Pain in left toe(s): Secondary | ICD-10-CM

## 2016-12-03 NOTE — Progress Notes (Signed)
Subjective: 83 y.o. returns the office today for painful, elongated, thickened toenails which he cannot trim himself. Denies any redness or drainage around the nails. Denies any acute changes since last appointment and no new complaints today. Denies any systemic complaints such as fevers, chills, nausea, vomiting.   Objective: AAO 3, NAD DP/PT pulses palpable 1/4, CRT less than 3 seconds Nails hypertrophic, dystrophic, elongated, brittle, discolored 10. There is tenderness overlying the nails 1-5 bilaterally. There is no surrounding erythema or drainage along the nail sites. No open lesions or pre-ulcerative lesions are identified. No other areas of tenderness bilateral lower extremities. No overlying edema, erythema, increased warmth. No pain with calf compression, swelling, warmth, erythema.  Assessment: Patient presents with symptomatic onychomycosis  Plan: -Treatment options including alternatives, risks, complications were discussed -Nails sharply debrided 10 without complication/bleeding. -Discussed daily foot inspection. If there are any changes, to call the office immediately.  -Follow-up in 4 months or sooner if any problems are to arise. In the meantime, encouraged to call the office with any questions, concerns, changes symptoms.  Celesta Gentile, DPM

## 2016-12-04 ENCOUNTER — Encounter: Payer: Self-pay | Admitting: Physician Assistant

## 2016-12-04 DIAGNOSIS — I69398 Other sequelae of cerebral infarction: Secondary | ICD-10-CM | POA: Diagnosis not present

## 2016-12-04 DIAGNOSIS — I69354 Hemiplegia and hemiparesis following cerebral infarction affecting left non-dominant side: Secondary | ICD-10-CM | POA: Diagnosis not present

## 2016-12-04 DIAGNOSIS — R2681 Unsteadiness on feet: Secondary | ICD-10-CM

## 2016-12-04 DIAGNOSIS — E1165 Type 2 diabetes mellitus with hyperglycemia: Secondary | ICD-10-CM | POA: Diagnosis not present

## 2016-12-04 DIAGNOSIS — R2689 Other abnormalities of gait and mobility: Secondary | ICD-10-CM | POA: Diagnosis not present

## 2016-12-04 DIAGNOSIS — N390 Urinary tract infection, site not specified: Secondary | ICD-10-CM | POA: Diagnosis not present

## 2016-12-04 DIAGNOSIS — F0390 Unspecified dementia without behavioral disturbance: Secondary | ICD-10-CM | POA: Diagnosis not present

## 2016-12-06 DIAGNOSIS — R2689 Other abnormalities of gait and mobility: Secondary | ICD-10-CM | POA: Diagnosis not present

## 2016-12-06 DIAGNOSIS — E1165 Type 2 diabetes mellitus with hyperglycemia: Secondary | ICD-10-CM | POA: Diagnosis not present

## 2016-12-06 DIAGNOSIS — N390 Urinary tract infection, site not specified: Secondary | ICD-10-CM | POA: Diagnosis not present

## 2016-12-06 DIAGNOSIS — F0390 Unspecified dementia without behavioral disturbance: Secondary | ICD-10-CM | POA: Diagnosis not present

## 2016-12-06 DIAGNOSIS — I69398 Other sequelae of cerebral infarction: Secondary | ICD-10-CM | POA: Diagnosis not present

## 2016-12-06 DIAGNOSIS — L570 Actinic keratosis: Secondary | ICD-10-CM | POA: Diagnosis not present

## 2016-12-06 DIAGNOSIS — L2084 Intrinsic (allergic) eczema: Secondary | ICD-10-CM | POA: Diagnosis not present

## 2016-12-06 DIAGNOSIS — I69354 Hemiplegia and hemiparesis following cerebral infarction affecting left non-dominant side: Secondary | ICD-10-CM | POA: Diagnosis not present

## 2016-12-06 DIAGNOSIS — I8312 Varicose veins of left lower extremity with inflammation: Secondary | ICD-10-CM | POA: Diagnosis not present

## 2016-12-06 DIAGNOSIS — I8311 Varicose veins of right lower extremity with inflammation: Secondary | ICD-10-CM | POA: Diagnosis not present

## 2016-12-10 ENCOUNTER — Telehealth: Payer: Self-pay | Admitting: Physician Assistant

## 2016-12-10 DIAGNOSIS — I69354 Hemiplegia and hemiparesis following cerebral infarction affecting left non-dominant side: Secondary | ICD-10-CM | POA: Diagnosis not present

## 2016-12-10 DIAGNOSIS — I69398 Other sequelae of cerebral infarction: Secondary | ICD-10-CM | POA: Diagnosis not present

## 2016-12-10 DIAGNOSIS — N390 Urinary tract infection, site not specified: Secondary | ICD-10-CM | POA: Diagnosis not present

## 2016-12-10 DIAGNOSIS — E1165 Type 2 diabetes mellitus with hyperglycemia: Secondary | ICD-10-CM | POA: Diagnosis not present

## 2016-12-10 DIAGNOSIS — F0390 Unspecified dementia without behavioral disturbance: Secondary | ICD-10-CM | POA: Diagnosis not present

## 2016-12-10 DIAGNOSIS — R2689 Other abnormalities of gait and mobility: Secondary | ICD-10-CM | POA: Diagnosis not present

## 2016-12-10 NOTE — Telephone Encounter (Signed)
Requesting verbal order for discharge.  Please call Deidre Garnet Koyanagi back at [phone] with orders.

## 2016-12-10 NOTE — Telephone Encounter (Signed)
Spoke with Einar Pheasant and advised the approval for the discharge for patient from physical therapy. He will start going to [place]. Deidre was advised with verbal order for discharge from physical therapy.

## 2016-12-12 DIAGNOSIS — E1165 Type 2 diabetes mellitus with hyperglycemia: Secondary | ICD-10-CM | POA: Diagnosis not present

## 2016-12-12 DIAGNOSIS — I69398 Other sequelae of cerebral infarction: Secondary | ICD-10-CM | POA: Diagnosis not present

## 2016-12-12 DIAGNOSIS — R2689 Other abnormalities of gait and mobility: Secondary | ICD-10-CM | POA: Diagnosis not present

## 2016-12-12 DIAGNOSIS — N390 Urinary tract infection, site not specified: Secondary | ICD-10-CM | POA: Diagnosis not present

## 2016-12-12 DIAGNOSIS — F0390 Unspecified dementia without behavioral disturbance: Secondary | ICD-10-CM | POA: Diagnosis not present

## 2016-12-12 DIAGNOSIS — I69354 Hemiplegia and hemiparesis following cerebral infarction affecting left non-dominant side: Secondary | ICD-10-CM | POA: Diagnosis not present

## 2016-12-16 ENCOUNTER — Ambulatory Visit: Payer: Medicare Other | Admitting: Physical Therapy

## 2016-12-17 ENCOUNTER — Telehealth: Payer: Self-pay | Admitting: Physician Assistant

## 2016-12-17 ENCOUNTER — Encounter: Payer: Self-pay | Admitting: Physician Assistant

## 2016-12-17 ENCOUNTER — Ambulatory Visit (INDEPENDENT_AMBULATORY_CARE_PROVIDER_SITE_OTHER): Payer: Medicare Other | Admitting: Physician Assistant

## 2016-12-17 VITALS — BP 118/64 | HR 68 | Temp 97.5°F | Resp 14 | Ht 66.0 in | Wt 161.0 lb

## 2016-12-17 DIAGNOSIS — R399 Unspecified symptoms and signs involving the genitourinary system: Secondary | ICD-10-CM | POA: Diagnosis not present

## 2016-12-17 LAB — POC URINALSYSI DIPSTICK (AUTOMATED)
Bilirubin, UA: NEGATIVE
Blood, UA: 10
GLUCOSE UA: NEGATIVE
KETONES UA: NEGATIVE
Nitrite, UA: NEGATIVE
PH UA: 6 (ref 5.0–8.0)
Protein, UA: 0.15
Spec Grav, UA: 1.015 (ref 1.030–1.035)
Urobilinogen, UA: NEGATIVE (ref ?–2.0)

## 2016-12-17 MED ORDER — AMOXICILLIN-POT CLAVULANATE 500-125 MG PO TABS: 1.0000 | ORAL_TABLET | Freq: Two times a day (BID) | ORAL | 0 refills | Status: DC

## 2016-12-17 NOTE — Progress Notes (Signed)
Patient presents to clinic today with his wife and a secondary caregiver c/o cloudy urine noted on catheterization x 2 days. Patient without fever, chills, nausea or vomiting. Had an episode of loose stool Sunday evening without recurrence. Denies urinary urgency, frequency, dysuria. Is being catheterized 4-5 times a day by wife per Urology. Good urinary output. Note good appetite and hydration. Deny AMS. Denies abdominal pain. Has history of urosepsis.   Past Medical History:  Diagnosis Date  . A-fib ([place])   . Cataracts, bilateral   . Chronic dermatitis   . Diabetes type 2, controlled ([place]) 2000  . History of chicken pox   . Mumps    Adult  . Retinopathy   . Shingles   . Stroke [facility]) Nov 2011  . Undescended testicle, unilateral    Right  . Whooping cough     Current Outpatient Prescriptions on File Prior to Visit  Medication Sig Dispense Refill  . aspirin 81 MG tablet Take 1 tablet (81 mg total) by mouth daily.    . Blood Glucose Monitoring Suppl (RELION PRIME MONITOR) DEVI Use to check fasting sugars daily. E 11.9 1 Device 0  . Calcium Carbonate Antacid (ALKA-SELTZER ANTACID PO) Take 2 tablets by mouth as needed (for indegestion).     . cyanocobalamin (,VITAMIN B-12,) 1000 MCG/ML injection Inject 1000 mcg intramuscular daily for 7 days, then once a week for 4 weeks, then once a month for a year (Patient taking differently: Inject 1,000 mcg into the muscle every 30 (thirty) days. ) 25 mL 0  . docusate sodium (COLACE) 100 MG capsule Take 100-200 mg by mouth See admin instructions. Take 100 mg by mouth daily for 2 days, then on every third day take 200 mg by mouth daily. Alternate 100 mg and 200 mg    . ELDERBERRY PO Take 1 tablet by mouth daily.    . Eyelid Cleansers (STERILID EX) Apply 1 application topically See admin instructions. Eye Wash: Once Daily     . glipiZIDE (GLUCOTROL XL) 2.5 MG 24 hr tablet Take 1 tablet (2.5 mg total) by mouth daily with breakfast. 30 tablet 5  .  glucose blood test strip One Touch Verio strips Use as instructed to check fasting sugars once daily .Dx:E11.9 100 each 12  . levothyroxine (SYNTHROID, LEVOTHROID) 50 MCG tablet Take 1 tablet (50 mcg total) by mouth daily. 90 tablet 3  . lovastatin (MEVACOR) 10 MG tablet Take 5 mg by mouth at bedtime.     . metFORMIN (GLUCOPHAGE-XR) 500 MG 24 hr tablet Take 2 tablets with breakfast and at bedtime (Patient taking differently: Take 1,000 mg by mouth 2 (two) times daily. ) 360 tablet 1  . metoprolol tartrate (LOPRESSOR) 25 MG tablet Take 0.5 tablets (12.5 mg total) by mouth 2 (two) times daily. 180 tablet 1  . Miconazole Nitrate (TRIPLE PASTE AF) 2 % OINT Apply 1 application topically once a week.     . Omega-3 Fatty Acids (SB OMEGA-3 FISH OIL) 1000 MG CAPS Take 1,000 mg by mouth 2 (two) times daily. Reported on 10/06/2015    . OVER THE COUNTER MEDICATION Take 1 capsule by mouth 2 (two) times daily. OTC Focus Select    . Probiotic Product (PROBIOTIC-10 PO) Take 1 capsule by mouth daily.    Marland Kitchen triamcinolone cream (KENALOG) 0.1 % Apply 1 application topically as needed (for itching).      No current facility-administered medications on file prior to visit.     Allergies  Allergen Reactions  .  Flomax [Tamsulosin Hcl] Other (See Comments)    Hypotension  . Shellfish Allergy Anaphylaxis  . Aspirin-Dipyridamole Er Other (See Comments)    Headaches   . Other Other (See Comments)    Blood Thinner given in Rehab gave H/As - Not Coumadin/Headaches      Family History  Problem Relation Age of Onset  . Pneumonia Mother 60    Deceased  . GI Bleed Father 45    Deceased - Ulcers  . Diabetes Cousin   . Diabetes Brother   . Cancer Paternal Aunt     Social History   Social History  . Marital status: Married    Spouse name: N/A  . Number of children: N/A  . Years of education: N/A   Social History Main Topics  . Smoking status: Former Research scientist (life sciences)  . Smokeless tobacco: Never Used     Comment:  Hasn't smoked since age 46  . Alcohol use No  . Drug use: No  . Sexual activity: Not Asked   Other Topics Concern  . None   Social History Narrative   Lives with wife in a one story home.  Has 1 child.  Retired Armed forces technical officer.  Education: Oceanographer.     Review of Systems - See HPI.  All other ROS are negative.  BP 118/64   Pulse 68   Temp 97.5 F (36.4 C) (Oral)   Resp 14   Ht '5\' 6"'  (1.676 m)   Wt 161 lb (73 kg)   SpO2 97%   BMI 25.99 kg/m   Physical Exam  Constitutional: He is oriented to person, place, and time and well-developed, well-nourished, and in no distress.  HENT:  Head: Normocephalic and atraumatic.  Eyes: Conjunctivae are normal.  Cardiovascular: Normal rate, regular rhythm, normal heart sounds and intact distal pulses.   Pulmonary/Chest: Effort normal and breath sounds normal. No respiratory distress. He has no wheezes. He has no rales. He exhibits no tenderness.  Abdominal: Soft. Bowel sounds are normal. He exhibits no distension. There is no tenderness.  Negative CVA tenderness  Neurological: He is alert and oriented to person, place, and time.  Skin: Skin is warm and dry.  Psychiatric: Affect normal.  Vitals reviewed.   Recent Results (from the past 2160 hour(s))  CBC with Differential     Status: Abnormal   Collection Time: 10/01/16 10:20 PM  Result Value Ref Range   WBC 13.3 (H) 4.0 - 10.5 K/uL   RBC 3.77 (L) 4.22 - 5.81 MIL/uL   Hemoglobin 11.4 (L) 13.0 - 17.0 g/dL   HCT 34.2 (L) 39.0 - 52.0 %   MCV 90.7 78.0 - 100.0 fL   MCH 30.2 26.0 - 34.0 pg   MCHC 33.3 30.0 - 36.0 g/dL   RDW 14.4 11.5 - 15.5 %   Platelets 205 150 - 400 K/uL   Neutrophils Relative % 72 %   Neutro Abs 9.4 (H) 1.7 - 7.7 K/uL   Lymphocytes Relative 15 %   Lymphs Abs 2.0 0.7 - 4.0 K/uL   Monocytes Relative 7 %   Monocytes Absolute 1.0 0.1 - 1.0 K/uL   Eosinophils Relative 6 %   Eosinophils Absolute 0.8 (H) 0.0 - 0.7 K/uL   Basophils Relative 0 %   Basophils Absolute 0.1  0.0 - 0.1 K/uL  Basic metabolic panel     Status: Abnormal   Collection Time: 10/01/16 10:20 PM  Result Value Ref Range   Sodium 134 (L) 135 - 145 mmol/L   Potassium  4.7 3.5 - 5.1 mmol/L   Chloride 98 (L) 101 - 111 mmol/L   CO2 26 22 - 32 mmol/L   Glucose, Bld 144 (H) 65 - 99 mg/dL   BUN 29 (H) 6 - 20 mg/dL   Creatinine, Ser 1.03 0.61 - 1.24 mg/dL   Calcium 9.5 8.9 - 10.3 mg/dL   GFR calc non Af Amer >60 >60 mL/min   GFR calc Af Amer >60 >60 mL/min    Comment: (NOTE) The eGFR has been calculated using the CKD EPI equation. This calculation has not been validated in all clinical situations. eGFR's persistently <60 mL/min signify possible Chronic Kidney Disease.    Anion gap 10 5 - 15  Urinalysis, Routine w reflex microscopic     Status: Abnormal   Collection Time: 10/01/16 10:45 PM  Result Value Ref Range   Color, Urine RED (A) YELLOW    Comment: BIOCHEMICALS MAY BE AFFECTED BY COLOR   APPearance TURBID (A) CLEAR   Specific Gravity, Urine 1.020 1.005 - 1.030   pH 7.0 5.0 - 8.0   Glucose, UA NEGATIVE NEGATIVE mg/dL   Hgb urine dipstick LARGE (A) NEGATIVE   Bilirubin Urine SMALL (A) NEGATIVE   Ketones, ur 15 (A) NEGATIVE mg/dL   Protein, ur >300 (A) NEGATIVE mg/dL   Nitrite POSITIVE (A) NEGATIVE   Leukocytes, UA LARGE (A) NEGATIVE  Urinalysis, Microscopic (reflex)     Status: Abnormal   Collection Time: 10/01/16 10:45 PM  Result Value Ref Range   RBC / HPF TOO NUMEROUS TO COUNT 0 - 5 RBC/hpf   WBC, UA TOO NUMEROUS TO COUNT 0 - 5 WBC/hpf   Bacteria, UA FEW (A) NONE SEEN   Squamous Epithelial / LPF 0-5 (A) NONE SEEN  Urine culture     Status: Abnormal   Collection Time: 10/01/16 10:45 PM  Result Value Ref Range   Specimen Description URINE, CATHETERIZED    Special Requests NONE    Culture >=100,000 COLONIES/mL ENTEROCOCCUS FAECALIS (A)    Report Status 10/04/2016 FINAL    Organism ID, Bacteria ENTEROCOCCUS FAECALIS (A)       Susceptibility   Enterococcus faecalis - MIC*     AMPICILLIN <=2 SENSITIVE Sensitive     LEVOFLOXACIN 1 SENSITIVE Sensitive     NITROFURANTOIN <=16 SENSITIVE Sensitive     VANCOMYCIN 1 SENSITIVE Sensitive     * >=100,000 COLONIES/mL ENTEROCOCCUS FAECALIS  POCT Glucose (CBG)     Status: Abnormal   Collection Time: 10/14/16 10:45 AM  Result Value Ref Range   POC Glucose 161 (A) 70 - 99 mg/dl  Hemoglobin A1c     Status: None   Collection Time: 10/14/16 10:51 AM  Result Value Ref Range   Hgb A1c MFr Bld 6.4 4.6 - 6.5 %    Comment: Glycemic Control Guidelines for People with Diabetes:Non Diabetic:  <6%Goal of Therapy: <7%Additional Action Suggested:  >1%   Basic metabolic panel     Status: Abnormal   Collection Time: 10/14/16 10:51 AM  Result Value Ref Range   Sodium 133 (L) 135 - 145 mEq/L   Potassium 5.1 3.5 - 5.1 mEq/L   Chloride 98 96 - 112 mEq/L   CO2 29 19 - 32 mEq/L   Glucose, Bld 175 (H) 70 - 99 mg/dL   BUN 18 6 - 23 mg/dL   Creatinine, Ser 1.02 0.40 - 1.50 mg/dL   Calcium 9.7 8.4 - 10.5 mg/dL   GFR 72.77 >60.00 mL/min  Urine Culture  Status: None   Collection Time: 10/14/16 10:51 AM  Result Value Ref Range   Organism ID, Bacteria NO GROWTH   Urinalysis, Routine w reflex microscopic     Status: Abnormal   Collection Time: 10/17/16  3:33 PM  Result Value Ref Range   Color, Urine YELLOW Yellow;Lt. Yellow   APPearance CLEAR Clear   Specific Gravity, Urine <=1.005 (A) 1.000 - 1.030   pH 7.0 5.0 - 8.0   Total Protein, Urine NEGATIVE Negative   Urine Glucose NEGATIVE Negative   Ketones, ur NEGATIVE Negative   Bilirubin Urine NEGATIVE Negative   Hgb urine dipstick NEGATIVE Negative   Urobilinogen, UA 0.2 0.0 - 1.0   Leukocytes, UA SMALL (A) Negative   Nitrite NEGATIVE Negative   WBC, UA 3-6/hpf (A) 0-2/hpf   RBC / HPF 0-2/hpf 0-2/hpf   Squamous Epithelial / LPF Rare(0-4/hpf) Rare(0-4/hpf)  Urine Culture     Status: None   Collection Time: 10/17/16  3:33 PM  Result Value Ref Range   Organism ID, Bacteria NO  GROWTH   Lipid panel     Status: Abnormal   Collection Time: 10/18/16 11:49 AM  Result Value Ref Range   Cholesterol 139 0 - 200 mg/dL    Comment: ATP III Classification       Desirable:  < 200 mg/dL               Borderline High:  200 - 239 mg/dL          High:  > = 240 mg/dL   Triglycerides 161.0 (H) 0.0 - 149.0 mg/dL    Comment: Normal:  <150 mg/dLBorderline High:  150 - 199 mg/dL   HDL 32.90 (L) >39.00 mg/dL   VLDL 32.2 0.0 - 40.0 mg/dL   LDL Cholesterol 73 0 - 99 mg/dL   Total CHOL/HDL Ratio 4     Comment:                Men          Women1/2 Average Risk     3.4          3.3Average Risk          5.0          4.42X Average Risk          9.6          7.13X Average Risk          15.0          11.0                       NonHDL 105.65     Comment: NOTE:  Non-HDL goal should be 30 mg/dL higher than patient's LDL goal (i.e. LDL goal of < 70 mg/dL, would have non-HDL goal of < 100 mg/dL)  Hepatic function panel     Status: Abnormal   Collection Time: 10/18/16 11:49 AM  Result Value Ref Range   Total Bilirubin 0.5 0.2 - 1.2 mg/dL   Bilirubin, Direct 0.1 0.0 - 0.3 mg/dL   Alkaline Phosphatase 58 39 - 117 U/L   AST 56 (H) 0 - 37 U/L   ALT 77 (H) 0 - 53 U/L   Total Protein 6.9 6.0 - 8.3 g/dL   Albumin 4.0 3.5 - 5.2 g/dL  CBC w/Diff     Status: Abnormal   Collection Time: 10/18/16 11:49 AM  Result Value Ref Range   WBC 8.5 4.0 - 10.5 K/uL  RBC 3.94 (L) 4.22 - 5.81 Mil/uL   Hemoglobin 11.9 (L) 13.0 - 17.0 g/dL   HCT 35.4 (L) 39.0 - 52.0 %   MCV 89.8 78.0 - 100.0 fl   MCHC 33.7 30.0 - 36.0 g/dL   RDW 15.9 (H) 11.5 - 15.5 %   Platelets 211.0 150.0 - 400.0 K/uL   Neutrophils Relative % 59.2 43.0 - 77.0 %   Lymphocytes Relative 17.9 12.0 - 46.0 %   Monocytes Relative 9.1 3.0 - 12.0 %   Eosinophils Relative 13.1 (H) 0.0 - 5.0 %   Basophils Relative 0.7 0.0 - 3.0 %   Neutro Abs 5.1 1.4 - 7.7 K/uL   Lymphs Abs 1.5 0.7 - 4.0 K/uL   Monocytes Absolute 0.8 0.1 - 1.0 K/uL   Eosinophils  Absolute 1.1 (H) 0.0 - 0.7 K/uL   Basophils Absolute 0.1 0.0 - 0.1 K/uL  C-reactive protein     Status: None   Collection Time: 10/18/16 11:49 AM  Result Value Ref Range   CRP 0.6 0.5 - 20.0 mg/dL  Blood Culture (routine x 2)     Status: None   Collection Time: 10/24/16  5:00 PM  Result Value Ref Range   Specimen Description BLOOD LEFT ANTECUBITAL    Special Requests      BOTTLES DRAWN AEROBIC AND ANAEROBIC 5CC BOTH BOTTLES   Culture      NO GROWTH 5 DAYS Performed at [facility], [address] [place], [place] [address]    Report Status 10/29/2016 FINAL   CBC with Differential     Status: Abnormal   Collection Time: 10/24/16  5:08 PM  Result Value Ref Range   WBC 12.1 (H) 4.0 - 10.5 K/uL   RBC 3.97 (L) 4.22 - 5.81 MIL/uL   Hemoglobin 12.2 (L) 13.0 - 17.0 g/dL   HCT 35.7 (L) 39.0 - 52.0 %   MCV 89.9 78.0 - 100.0 fL   MCH 30.7 26.0 - 34.0 pg   MCHC 34.2 30.0 - 36.0 g/dL   RDW 15.1 11.5 - 15.5 %   Platelets 202 150 - 400 K/uL   Neutrophils Relative % 72 %   Neutro Abs 8.8 (H) 1.7 - 7.7 K/uL   Lymphocytes Relative 11 %   Lymphs Abs 1.3 0.7 - 4.0 K/uL   Monocytes Relative 12 %   Monocytes Absolute 1.4 (H) 0.1 - 1.0 K/uL   Eosinophils Relative 4 %   Eosinophils Absolute 0.5 0.0 - 0.7 K/uL   Basophils Relative 1 %   Basophils Absolute 0.1 0.0 - 0.1 K/uL  Basic metabolic panel     Status: Abnormal   Collection Time: 10/24/16  5:08 PM  Result Value Ref Range   Sodium 132 (L) 135 - 145 mmol/L   Potassium 5.1 3.5 - 5.1 mmol/L   Chloride 98 (L) 101 - 111 mmol/L   CO2 23 22 - 32 mmol/L   Glucose, Bld 202 (H) 65 - 99 mg/dL   BUN 25 (H) 6 - 20 mg/dL   Creatinine, Ser 1.19 0.61 - 1.24 mg/dL   Calcium 9.4 8.9 - 10.3 mg/dL   GFR calc non Af Amer 52 (L) >60 mL/min   GFR calc Af Amer >60 >60 mL/min    Comment: (NOTE) The eGFR has been calculated using the CKD EPI equation. This calculation has not been validated in all clinical situations. eGFR's persistently <60 mL/min  signify possible Chronic Kidney Disease.    Anion gap 11 5 - 15  Hepatic  function panel     Status: Abnormal   Collection Time: 10/24/16  5:08 PM  Result Value Ref Range   Total Protein 7.2 6.5 - 8.1 g/dL   Albumin 3.7 3.5 - 5.0 g/dL   AST 56 (H) 15 - 41 U/L   ALT 78 (H) 17 - 63 U/L   Alkaline Phosphatase 59 38 - 126 U/L   Total Bilirubin 0.6 0.3 - 1.2 mg/dL   Bilirubin, Direct 0.2 0.1 - 0.5 mg/dL   Indirect Bilirubin 0.4 0.3 - 0.9 mg/dL  Blood Culture (routine x 2)     Status: None   Collection Time: 10/24/16  5:10 PM  Result Value Ref Range   Specimen Description BLOOD LEFT FOREARM    Special Requests      BOTTLES DRAWN AEROBIC AND ANAEROBIC 5CC BOTH BOTTLES   Culture      NO GROWTH 5 DAYS Performed at [facility], [place] [address] [place], [place] [address]    Report Status 10/29/2016 FINAL   I-Stat CG4 Lactic Acid, ED     Status: Abnormal   Collection Time: 10/24/16  5:23 PM  Result Value Ref Range   Lactic Acid, Venous 4.38 (HH) 0.5 - 1.9 mmol/L   Comment NOTIFIED PHYSICIAN   Urinalysis, Routine w reflex microscopic     Status: Abnormal   Collection Time: 10/24/16  5:47 PM  Result Value Ref Range   Color, Urine YELLOW YELLOW   APPearance CLOUDY (A) CLEAR   Specific Gravity, Urine 1.013 1.005 - 1.030   pH 6.5 5.0 - 8.0   Glucose, UA NEGATIVE NEGATIVE mg/dL   Hgb urine dipstick SMALL (A) NEGATIVE   Bilirubin Urine NEGATIVE NEGATIVE   Ketones, ur NEGATIVE NEGATIVE mg/dL   Protein, ur 30 (A) NEGATIVE mg/dL   Nitrite NEGATIVE NEGATIVE   Leukocytes, UA LARGE (A) NEGATIVE  Urine culture     Status: Abnormal   Collection Time: 10/24/16  5:47 PM  Result Value Ref Range   Specimen Description URINE, CLEAN CATCH    Special Requests NONE    Culture >=100,000 COLONIES/mL ESCHERICHIA COLI (A)    Report Status 10/27/2016 FINAL    Organism ID, Bacteria ESCHERICHIA COLI (A)       Susceptibility   Escherichia coli - MIC*    AMPICILLIN 8 SENSITIVE Sensitive      CEFAZOLIN <=4 SENSITIVE Sensitive     CEFTRIAXONE <=1 SENSITIVE Sensitive     CIPROFLOXACIN <=0.25 SENSITIVE Sensitive     GENTAMICIN <=1 SENSITIVE Sensitive     IMIPENEM <=0.25 SENSITIVE Sensitive     NITROFURANTOIN <=16 SENSITIVE Sensitive     TRIMETH/SULFA <=20 SENSITIVE Sensitive     AMPICILLIN/SULBACTAM 4 SENSITIVE Sensitive     PIP/TAZO <=4 SENSITIVE Sensitive     Extended ESBL NEGATIVE Sensitive     * >=100,000 COLONIES/mL ESCHERICHIA COLI  Urinalysis, Microscopic (reflex)     Status: Abnormal   Collection Time: 10/24/16  5:47 PM  Result Value Ref Range   RBC / HPF 0-5 0 - 5 RBC/hpf   WBC, UA TOO NUMEROUS TO COUNT 0 - 5 WBC/hpf   Bacteria, UA MANY (A) NONE SEEN   Squamous Epithelial / LPF 0-5 (A) NONE SEEN  I-Stat CG4 Lactic Acid, ED     Status: Abnormal   Collection Time: 10/24/16  8:19 PM  Result Value Ref Range   Lactic Acid, Venous 2.39 (HH) 0.5 - 1.9 mmol/L   Comment NOTIFIED PHYSICIAN   MRSA PCR Screening  Status: None   Collection Time: 10/24/16 11:09 PM  Result Value Ref Range   MRSA by PCR NEGATIVE NEGATIVE    Comment:        The GeneXpert MRSA Assay (FDA approved for NASAL specimens only), is one component of a comprehensive MRSA colonization surveillance program. It is not intended to diagnose MRSA infection nor to guide or monitor treatment for MRSA infections.   Respiratory Panel by PCR     Status: None   Collection Time: 10/25/16 12:44 AM  Result Value Ref Range   Adenovirus NOT DETECTED NOT DETECTED   Coronavirus 229E NOT DETECTED NOT DETECTED   Coronavirus HKU1 NOT DETECTED NOT DETECTED   Coronavirus NL63 NOT DETECTED NOT DETECTED   Coronavirus OC43 NOT DETECTED NOT DETECTED   Metapneumovirus NOT DETECTED NOT DETECTED   Rhinovirus / Enterovirus NOT DETECTED NOT DETECTED   Influenza A NOT DETECTED NOT DETECTED   Influenza B NOT DETECTED NOT DETECTED   Parainfluenza Virus 1 NOT DETECTED NOT DETECTED   Parainfluenza Virus 2 NOT DETECTED NOT  DETECTED   Parainfluenza Virus 3 NOT DETECTED NOT DETECTED   Parainfluenza Virus 4 NOT DETECTED NOT DETECTED   Respiratory Syncytial Virus NOT DETECTED NOT DETECTED   Bordetella pertussis NOT DETECTED NOT DETECTED   Chlamydophila pneumoniae NOT DETECTED NOT DETECTED   Mycoplasma pneumoniae NOT DETECTED NOT DETECTED  Lactic acid, plasma     Status: None   Collection Time: 10/25/16  1:09 AM  Result Value Ref Range   Lactic Acid, Venous 1.2 0.5 - 1.9 mmol/L  CBC     Status: Abnormal   Collection Time: 10/25/16  1:09 AM  Result Value Ref Range   WBC 9.3 4.0 - 10.5 K/uL   RBC 3.50 (L) 4.22 - 5.81 MIL/uL   Hemoglobin 10.5 (L) 13.0 - 17.0 g/dL   HCT 31.1 (L) 39.0 - 52.0 %   MCV 88.9 78.0 - 100.0 fL   MCH 30.0 26.0 - 34.0 pg   MCHC 33.8 30.0 - 36.0 g/dL   RDW 15.4 11.5 - 15.5 %   Platelets 159 150 - 400 K/uL  Basic metabolic panel     Status: Abnormal   Collection Time: 10/25/16  1:09 AM  Result Value Ref Range   Sodium 137 135 - 145 mmol/L   Potassium 4.4 3.5 - 5.1 mmol/L   Chloride 106 101 - 111 mmol/L   CO2 22 22 - 32 mmol/L   Glucose, Bld 151 (H) 65 - 99 mg/dL   BUN 18 6 - 20 mg/dL   Creatinine, Ser 1.07 0.61 - 1.24 mg/dL   Calcium 8.6 (L) 8.9 - 10.3 mg/dL   GFR calc non Af Amer 59 (L) >60 mL/min   GFR calc Af Amer >60 >60 mL/min    Comment: (NOTE) The eGFR has been calculated using the CKD EPI equation. This calculation has not been validated in all clinical situations. eGFR's persistently <60 mL/min signify possible Chronic Kidney Disease.    Anion gap 9 5 - 15  Procalcitonin     Status: None   Collection Time: 10/25/16  1:09 AM  Result Value Ref Range   Procalcitonin 0.17 ng/mL    Comment:        Interpretation: PCT (Procalcitonin) <= 0.5 ng/mL: Systemic infection (sepsis) is not likely. Local bacterial infection is possible. (NOTE)         ICU PCT Algorithm               Non ICU PCT  Algorithm    ----------------------------     ------------------------------          PCT < 0.25 ng/mL                 PCT < 0.1 ng/mL     Stopping of antibiotics            Stopping of antibiotics       strongly encouraged.               strongly encouraged.    ----------------------------     ------------------------------       PCT level decrease by               PCT < 0.25 ng/mL       >= 80% from peak PCT       OR PCT 0.25 - 0.5 ng/mL          Stopping of antibiotics                                             encouraged.     Stopping of antibiotics           encouraged.    ----------------------------     ------------------------------       PCT level decrease by              PCT >= 0.25 ng/mL       < 80% from peak PCT        AND PCT >= 0.5 ng/mL            Continuin g antibiotics                                              encouraged.       Continuing antibiotics            encouraged.    ----------------------------     ------------------------------     PCT level increase compared          PCT > 0.5 ng/mL         with peak PCT AND          PCT >= 0.5 ng/mL             Escalation of antibiotics                                          strongly encouraged.      Escalation of antibiotics        strongly encouraged.   Lactic acid, plasma     Status: None   Collection Time: 10/25/16  3:56 AM  Result Value Ref Range   Lactic Acid, Venous 1.0 0.5 - 1.9 mmol/L  Glucose, capillary     Status: Abnormal   Collection Time: 10/25/16  1:48 PM  Result Value Ref Range   Glucose-Capillary 129 (H) 65 - 99 mg/dL  Glucose, capillary     Status: Abnormal   Collection Time: 10/25/16  4:52 PM  Result Value Ref Range   Glucose-Capillary 162 (H) 65 - 99 mg/dL  Glucose, capillary     Status: Abnormal   Collection  Time: 10/25/16  8:51 PM  Result Value Ref Range   Glucose-Capillary 187 (H) 65 - 99 mg/dL  Glucose, capillary     Status: Abnormal   Collection Time: 10/26/16  7:47 AM  Result Value Ref Range   Glucose-Capillary 165 (H) 65 - 99 mg/dL  Glucose, capillary      Status: Abnormal   Collection Time: 10/26/16 11:45 AM  Result Value Ref Range   Glucose-Capillary 168 (H) 65 - 99 mg/dL  Glucose, capillary     Status: Abnormal   Collection Time: 10/26/16  4:54 PM  Result Value Ref Range   Glucose-Capillary 144 (H) 65 - 99 mg/dL  Glucose, capillary     Status: Abnormal   Collection Time: 10/26/16  9:08 PM  Result Value Ref Range   Glucose-Capillary 129 (H) 65 - 99 mg/dL  CBC     Status: Abnormal   Collection Time: 10/27/16  4:42 AM  Result Value Ref Range   WBC 6.9 4.0 - 10.5 K/uL   RBC 3.66 (L) 4.22 - 5.81 MIL/uL   Hemoglobin 10.9 (L) 13.0 - 17.0 g/dL   HCT 32.1 (L) 39.0 - 52.0 %   MCV 87.7 78.0 - 100.0 fL   MCH 29.8 26.0 - 34.0 pg   MCHC 34.0 30.0 - 36.0 g/dL   RDW 15.5 11.5 - 15.5 %   Platelets 199 150 - 400 K/uL  Basic metabolic panel     Status: Abnormal   Collection Time: 10/27/16  4:42 AM  Result Value Ref Range   Sodium 134 (L) 135 - 145 mmol/L   Potassium 4.2 3.5 - 5.1 mmol/L   Chloride 103 101 - 111 mmol/L   CO2 23 22 - 32 mmol/L   Glucose, Bld 165 (H) 65 - 99 mg/dL   BUN 13 6 - 20 mg/dL   Creatinine, Ser 1.17 0.61 - 1.24 mg/dL   Calcium 9.1 8.9 - 10.3 mg/dL   GFR calc non Af Amer 53 (L) >60 mL/min   GFR calc Af Amer >60 >60 mL/min    Comment: (NOTE) The eGFR has been calculated using the CKD EPI equation. This calculation has not been validated in all clinical situations. eGFR's persistently <60 mL/min signify possible Chronic Kidney Disease.    Anion gap 8 5 - 15  Glucose, capillary     Status: Abnormal   Collection Time: 10/27/16  8:06 AM  Result Value Ref Range   Glucose-Capillary 164 (H) 65 - 99 mg/dL  Glucose, capillary     Status: Abnormal   Collection Time: 10/27/16 11:59 AM  Result Value Ref Range   Glucose-Capillary 166 (H) 65 - 99 mg/dL  Urinalysis, Routine w reflex microscopic     Status: Abnormal   Collection Time: 10/31/16 12:37 AM  Result Value Ref Range   Color, Urine YELLOW YELLOW   APPearance CLEAR  CLEAR   Specific Gravity, Urine 1.013 1.005 - 1.030   pH 6.5 5.0 - 8.0   Glucose, UA NEGATIVE NEGATIVE mg/dL   Hgb urine dipstick NEGATIVE NEGATIVE   Bilirubin Urine NEGATIVE NEGATIVE   Ketones, ur NEGATIVE NEGATIVE mg/dL   Protein, ur NEGATIVE NEGATIVE mg/dL   Nitrite NEGATIVE NEGATIVE   Leukocytes, UA TRACE (A) NEGATIVE  CBC with Differential/Platelet     Status: Abnormal   Collection Time: 10/31/16 12:37 AM  Result Value Ref Range   WBC 10.2 4.0 - 10.5 K/uL   RBC 3.71 (L) 4.22 - 5.81 MIL/uL   Hemoglobin 11.3 (L) 13.0 - 17.0 g/dL  HCT 33.4 (L) 39.0 - 52.0 %   MCV 90.0 78.0 - 100.0 fL   MCH 30.5 26.0 - 34.0 pg   MCHC 33.8 30.0 - 36.0 g/dL   RDW 15.4 11.5 - 15.5 %   Platelets 261 150 - 400 K/uL   Neutrophils Relative % 53 %   Neutro Abs 5.5 1.7 - 7.7 K/uL   Lymphocytes Relative 20 %   Lymphs Abs 2.0 0.7 - 4.0 K/uL   Monocytes Relative 13 %   Monocytes Absolute 1.3 (H) 0.1 - 1.0 K/uL   Eosinophils Relative 13 %   Eosinophils Absolute 1.4 (H) 0.0 - 0.7 K/uL   Basophils Relative 1 %   Basophils Absolute 0.1 0.0 - 0.1 K/uL  Basic metabolic panel     Status: Abnormal   Collection Time: 10/31/16 12:37 AM  Result Value Ref Range   Sodium 133 (L) 135 - 145 mmol/L   Potassium 4.2 3.5 - 5.1 mmol/L   Chloride 101 101 - 111 mmol/L   CO2 23 22 - 32 mmol/L   Glucose, Bld 155 (H) 65 - 99 mg/dL   BUN 17 6 - 20 mg/dL   Creatinine, Ser 1.01 0.61 - 1.24 mg/dL   Calcium 9.3 8.9 - 10.3 mg/dL   GFR calc non Af Amer >60 >60 mL/min   GFR calc Af Amer >60 >60 mL/min    Comment: (NOTE) The eGFR has been calculated using the CKD EPI equation. This calculation has not been validated in all clinical situations. eGFR's persistently <60 mL/min signify possible Chronic Kidney Disease.    Anion gap 9 5 - 15  Urinalysis, Microscopic (reflex)     Status: Abnormal   Collection Time: 10/31/16 12:37 AM  Result Value Ref Range   RBC / HPF NONE SEEN 0 - 5 RBC/hpf   WBC, UA 0-5 0 - 5 WBC/hpf    Bacteria, UA RARE (A) NONE SEEN   Squamous Epithelial / LPF 0-5 (A) NONE SEEN  Blood culture (routine x 2)     Status: None   Collection Time: 10/31/16  2:05 AM  Result Value Ref Range   Specimen Description BLOOD RIGHT FOREARM    Special Requests BOTTLES DRAWN AEROBIC AND ANAEROBIC 5CC EACH    Culture      NO GROWTH 5 DAYS Performed at [facility], [place] [address] [place], [place] [address]    Report Status 11/05/2016 FINAL   Blood culture (routine x 2)     Status: None   Collection Time: 10/31/16  2:10 AM  Result Value Ref Range   Specimen Description BLOOD LEFT FOREARM    Special Requests BOTTLES DRAWN AEROBIC AND ANAEROBIC 5CC EACH    Culture      NO GROWTH 5 DAYS Performed at [facility], [place] [address], [place], [place] [address]    Report Status 11/05/2016 FINAL   Glucose, capillary     Status: Abnormal   Collection Time: 10/31/16  4:48 PM  Result Value Ref Range   Glucose-Capillary 146 (H) 65 - 99 mg/dL  Strep pneumoniae urinary antigen     Status: None   Collection Time: 10/31/16  6:30 PM  Result Value Ref Range   Strep Pneumo Urinary Antigen NEGATIVE NEGATIVE    Comment:        Infection due to S. pneumoniae cannot be absolutely ruled out since the antigen present may be below the detection limit of the test.   Glucose, capillary     Status:  Abnormal   Collection Time: 10/31/16 10:17 PM  Result Value Ref Range   Glucose-Capillary 135 (H) 65 - 99 mg/dL  Basic metabolic panel     Status: Abnormal   Collection Time: 11/01/16  5:23 AM  Result Value Ref Range   Sodium 133 (L) 135 - 145 mmol/L   Potassium 4.2 3.5 - 5.1 mmol/L   Chloride 100 (L) 101 - 111 mmol/L   CO2 22 22 - 32 mmol/L   Glucose, Bld 169 (H) 65 - 99 mg/dL   BUN 15 6 - 20 mg/dL   Creatinine, Ser 1.15 0.61 - 1.24 mg/dL   Calcium 9.2 8.9 - 10.3 mg/dL   GFR calc non Af Amer 54 (L) >60 mL/min   GFR calc Af Amer >60 >60 mL/min    Comment: (NOTE) The eGFR has been calculated using the  CKD EPI equation. This calculation has not been validated in all clinical situations. eGFR's persistently <60 mL/min signify possible Chronic Kidney Disease.    Anion gap 11 5 - 15  CBC     Status: Abnormal   Collection Time: 11/01/16  5:23 AM  Result Value Ref Range   WBC 8.5 4.0 - 10.5 K/uL   RBC 3.82 (L) 4.22 - 5.81 MIL/uL   Hemoglobin 11.2 (L) 13.0 - 17.0 g/dL   HCT 33.8 (L) 39.0 - 52.0 %   MCV 88.5 78.0 - 100.0 fL   MCH 29.3 26.0 - 34.0 pg   MCHC 33.1 30.0 - 36.0 g/dL   RDW 15.6 (H) 11.5 - 15.5 %   Platelets 243 150 - 400 K/uL  Glucose, capillary     Status: Abnormal   Collection Time: 11/01/16  8:09 AM  Result Value Ref Range   Glucose-Capillary 155 (H) 65 - 99 mg/dL  Glucose, capillary     Status: Abnormal   Collection Time: 11/01/16 12:39 PM  Result Value Ref Range   Glucose-Capillary 182 (H) 65 - 99 mg/dL  Glucose, capillary     Status: Abnormal   Collection Time: 11/01/16  5:22 PM  Result Value Ref Range   Glucose-Capillary 181 (H) 65 - 99 mg/dL  Glucose, capillary     Status: Abnormal   Collection Time: 11/01/16  9:16 PM  Result Value Ref Range   Glucose-Capillary 144 (H) 65 - 99 mg/dL  Glucose, capillary     Status: Abnormal   Collection Time: 11/02/16  8:09 AM  Result Value Ref Range   Glucose-Capillary 170 (H) 65 - 99 mg/dL  Glucose, capillary     Status: Abnormal   Collection Time: 11/02/16 12:43 PM  Result Value Ref Range   Glucose-Capillary 181 (H) 65 - 99 mg/dL  Glucose, capillary     Status: Abnormal   Collection Time: 11/02/16  5:13 PM  Result Value Ref Range   Glucose-Capillary 139 (H) 65 - 99 mg/dL  Glucose, capillary     Status: Abnormal   Collection Time: 11/02/16  9:20 PM  Result Value Ref Range   Glucose-Capillary 184 (H) 65 - 99 mg/dL  Glucose, capillary     Status: Abnormal   Collection Time: 11/03/16  7:42 AM  Result Value Ref Range   Glucose-Capillary 192 (H) 65 - 99 mg/dL  Glucose, capillary     Status: Abnormal   Collection Time:  11/03/16 12:31 PM  Result Value Ref Range   Glucose-Capillary 170 (H) 65 - 99 mg/dL  CULTURE, URINE COMPREHENSIVE     Status: None   Collection Time: 11/11/16 12:26 PM  Result Value Ref Range   Organism ID, Bacteria NO GROWTH   CBC     Status: Abnormal   Collection Time: 11/11/16 12:30 PM  Result Value Ref Range   WBC 11.4 (H) 4.0 - 10.5 K/uL   RBC 4.28 4.22 - 5.81 Mil/uL   Platelets 323.0 150.0 - 400.0 K/uL   Hemoglobin 12.7 (L) 13.0 - 17.0 g/dL   HCT 38.5 (L) 39.0 - 52.0 %   MCV 89.9 78.0 - 100.0 fl   MCHC 32.9 30.0 - 36.0 g/dL   RDW 16.1 (H) 11.5 - 54.6 %  Basic metabolic panel     Status: Abnormal   Collection Time: 11/11/16 12:30 PM  Result Value Ref Range   Sodium 136 135 - 145 mEq/L   Potassium 5.2 (H) 3.5 - 5.1 mEq/L   Chloride 98 96 - 112 mEq/L   CO2 25 19 - 32 mEq/L   Glucose, Bld 122 (H) 70 - 99 mg/dL   BUN 25 (H) 6 - 23 mg/dL   Creatinine, Ser 1.15 0.40 - 1.50 mg/dL   Calcium 10.0 8.4 - 10.5 mg/dL   GFR 63.35 >60.00 mL/min  POCT Urinalysis Dipstick     Status: Abnormal   Collection Time: 11/18/16 11:40 AM  Result Value Ref Range   Color, UA yellow    Clarity, UA cloudy    Glucose, UA negative    Bilirubin, UA negative    Ketones, UA negative    Spec Grav, UA 1.015    Blood, UA negative    pH, UA 6.0    Protein, UA negative    Urobilinogen, UA 0.2    Nitrite, UA negative    Leukocytes, UA large (3+) (A) Negative  Basic metabolic panel     Status: Abnormal   Collection Time: 11/18/16 12:01 PM  Result Value Ref Range   Sodium 137 135 - 145 mEq/L   Potassium 5.4 (H) 3.5 - 5.1 mEq/L   Chloride 99 96 - 112 mEq/L   CO2 27 19 - 32 mEq/L   Glucose, Bld 143 (H) 70 - 99 mg/dL   BUN 14 6 - 23 mg/dL   Creatinine, Ser 0.99 0.40 - 1.50 mg/dL   Calcium 9.6 8.4 - 10.5 mg/dL   GFR 75.31 >60.00 mL/min  CBC     Status: Abnormal   Collection Time: 11/18/16 12:01 PM  Result Value Ref Range   WBC 9.4 4.0 - 10.5 K/uL   RBC 3.88 (L) 4.22 - 5.81 Mil/uL   Platelets  215.0 150.0 - 400.0 K/uL   Hemoglobin 11.7 (L) 13.0 - 17.0 g/dL   HCT 34.5 (L) 39.0 - 52.0 %   MCV 89.0 78.0 - 100.0 fl   MCHC 33.9 30.0 - 36.0 g/dL   RDW 15.9 (H) 11.5 - 15.5 %  Urine Culture     Status: None   Collection Time: 11/18/16 12:34 PM  Result Value Ref Range   Organism ID, Bacteria NO GROWTH   Urine Microalbumin w/creat. ratio     Status: Abnormal   Collection Time: 11/18/16 12:34 PM  Result Value Ref Range   Microalb, Ur 3.9 (H) 0.0 - 1.9 mg/dL   Creatinine,U 35.1 mg/dL   Microalb Creat Ratio 11.1 0.0 - 30.0 mg/g  Basic metabolic panel     Status: Abnormal   Collection Time: 11/21/16  4:31 PM  Result Value Ref Range   Sodium 133 (L) 135 - 145 mEq/L   Potassium 5.1 3.5 - 5.1 mEq/L   Chloride 96  96 - 112 mEq/L   CO2 28 19 - 32 mEq/L   Glucose, Bld 90 70 - 99 mg/dL   BUN 20 6 - 23 mg/dL   Creatinine, Ser 1.05 0.40 - 1.50 mg/dL   Calcium 9.7 8.4 - 10.5 mg/dL   GFR 70.36 >60.00 mL/min  Hepatic function panel     Status: None   Collection Time: 11/21/16  4:31 PM  Result Value Ref Range   Total Bilirubin 0.3 0.2 - 1.2 mg/dL   Bilirubin, Direct 0.1 0.0 - 0.3 mg/dL   Alkaline Phosphatase 41 39 - 117 U/L   AST 18 0 - 37 U/L   ALT 13 0 - 53 U/L   Total Protein 6.9 6.0 - 8.3 g/dL   Albumin 3.7 3.5 - 5.2 g/dL  CBC w/Diff     Status: Abnormal   Collection Time: 11/21/16  4:31 PM  Result Value Ref Range   WBC 9.4 4.0 - 10.5 K/uL   RBC 4.01 (L) 4.22 - 5.81 Mil/uL   Hemoglobin 11.9 (L) 13.0 - 17.0 g/dL   HCT 35.2 (L) 39.0 - 52.0 %   MCV 87.7 78.0 - 100.0 fl   MCHC 33.9 30.0 - 36.0 g/dL   RDW 16.0 (H) 11.5 - 15.5 %   Platelets 196.0 150.0 - 400.0 K/uL   Neutrophils Relative % 49.8 43.0 - 77.0 %   Lymphocytes Relative 30.2 12.0 - 46.0 %   Monocytes Relative 9.2 3.0 - 12.0 %   Eosinophils Relative 9.8 (H) 0.0 - 5.0 %   Basophils Relative 1.0 0.0 - 3.0 %   Neutro Abs 4.7 1.4 - 7.7 K/uL   Lymphs Abs 2.8 0.7 - 4.0 K/uL   Monocytes Absolute 0.9 0.1 - 1.0 K/uL    Eosinophils Absolute 0.9 (H) 0.0 - 0.7 K/uL   Basophils Absolute 0.1 0.0 - 0.1 K/uL  C-reactive protein     Status: Abnormal   Collection Time: 11/21/16  4:31 PM  Result Value Ref Range   CRP 0.1 (L) 0.5 - 20.0 mg/dL    Assessment/Plan: 1. UTI symptoms Cloudy urine. Patient with history of urosepsis. Vitals stable. No other symptoms. Urine dip with LE. Will send for culture. Giving advanced age and history of sepsis 2/2 UTI, will start low dose Augmentin while culture pending. Continue daily probiotic. Alarm signs/symptoms discussed that would prompt ER assessment.   - POCT Urinalysis Dipstick (Automated) - amoxicillin-clavulanate (AUGMENTIN) 500-125 MG tablet; Take 1 tablet (500 mg total) by mouth 2 (two) times daily.  Dispense: 6 tablet; Refill: 0   Leeanne Rio, [place]

## 2016-12-17 NOTE — Patient Instructions (Signed)
Your symptoms are consistent with a bladder infection, also called acute cystitis. Please take your antibiotic (augmentin) as directed. I have sent in 3-day supply to take while waiting on the culture results Stay very well hydrated.  Consider a daily probiotic (Align, Culturelle, or Activia) to help prevent stomach upset caused by the antibiotic.  Taking a probiotic daily may also help prevent recurrent UTIs.  I will call you with your urine testing results.  We will change antibiotics if indicated.  Call or return to clinic if symptoms are not resolved by completion of antibiotic.   Urinary Tract Infection A urinary tract infection (UTI) can occur any place along the urinary tract. The tract includes the kidneys, ureters, bladder, and urethra. A type of germ called bacteria often causes a UTI. UTIs are often helped with antibiotic medicine.  HOME CARE   If given, take antibiotics as told by your doctor. Finish them even if you start to feel better.  Drink enough fluids to keep your pee (urine) clear or pale yellow.  Avoid tea, drinks with caffeine, and bubbly (carbonated) drinks.  Pee often. Avoid holding your pee in for a long time.  Pee before and after having sex (intercourse).  Wipe from front to back after you poop (bowel movement) if you are a woman. Use each tissue only once. GET HELP RIGHT AWAY IF:   You have back pain.  You have lower belly (abdominal) pain.  You have chills.  You feel sick to your stomach (nauseous).  You throw up (vomit).  Your burning or discomfort with peeing does not go away.  You have a fever.  Your symptoms are not better in 3 days. MAKE SURE YOU:   Understand these instructions.  Will watch your condition.  Will get help right away if you are not doing well or get worse. Document Released: 02/26/2008 Document Revised: 06/03/2012 Document Reviewed: 04/09/2012 [facility] Patient Information 2015 [place], [place]. This information is not  intended to replace advice given to you by your health care provider. Make sure you discuss any questions you have with your health care provider.

## 2016-12-17 NOTE — Telephone Encounter (Signed)
Noted  

## 2016-12-17 NOTE — Telephone Encounter (Signed)
Patient's wife calling back to report to disregard previous message.  She has rearranged her schedule so that she can come in with patient.  Patient has been scheduled for 1pm today.

## 2016-12-17 NOTE — Telephone Encounter (Signed)
Patient's wife calling to report patient's urine is cloudy and she thinks he has another UTI.  She wants to know if patient can have something sent to pharmacy without having to come in for an appt.   If not, he will need to be cathed in the office in order to get a urine sample as she is not able to come in with him due to her being in [place].  Please return call to patient's wife at [phone] advising what to do.

## 2016-12-17 NOTE — Addendum Note (Signed)
Addended by: Leonidas Romberg on: 12/17/2016 01:55 PM   Modules accepted: Orders

## 2016-12-17 NOTE — Progress Notes (Signed)
Pre visit review using our clinic review tool, if applicable. No additional management support is needed unless otherwise documented below in the visit note. 

## 2016-12-19 ENCOUNTER — Encounter: Payer: Self-pay | Admitting: Physician Assistant

## 2016-12-19 LAB — URINE CULTURE

## 2016-12-20 ENCOUNTER — Encounter: Payer: Self-pay | Admitting: Physician Assistant

## 2016-12-20 ENCOUNTER — Other Ambulatory Visit: Payer: Self-pay | Admitting: Physician Assistant

## 2016-12-20 DIAGNOSIS — R399 Unspecified symptoms and signs involving the genitourinary system: Secondary | ICD-10-CM

## 2016-12-20 MED ORDER — AMOXICILLIN-POT CLAVULANATE 500-125 MG PO TABS: 1.0000 | ORAL_TABLET | Freq: Two times a day (BID) | ORAL | 0 refills | Status: DC

## 2016-12-23 ENCOUNTER — Ambulatory Visit: Payer: Medicare Other | Attending: Physician Assistant | Admitting: Physical Therapy

## 2016-12-23 DIAGNOSIS — R2689 Other abnormalities of gait and mobility: Secondary | ICD-10-CM

## 2016-12-23 DIAGNOSIS — M6281 Muscle weakness (generalized): Secondary | ICD-10-CM

## 2016-12-23 DIAGNOSIS — R262 Difficulty in walking, not elsewhere classified: Secondary | ICD-10-CM

## 2016-12-23 DIAGNOSIS — R2681 Unsteadiness on feet: Secondary | ICD-10-CM | POA: Diagnosis not present

## 2016-12-23 NOTE — Therapy (Addendum)
[place] [address]  [place], [place], [address] Phone: [phone]   Fax:  [phone]  Physical Therapy Evaluation  Patient Details  Name: Gerald Holt MRN: [id number] Date of Birth: 1924-05-17 Referring Provider: [name], PA-C  Encounter Date: 12/23/2016      PT End of Session - 12/23/16 1449    Visit Number 1   Number of Visits 16   Date for PT Re-Evaluation 02/21/17   Authorization Type Medicare / AARP   PT Start Time 1410   PT Stop Time 1449   PT Time Calculation (min) 39 min   Activity Tolerance Patient tolerated treatment well   Behavior During Therapy [facility] for tasks assessed/performed      Past Medical History:  Diagnosis Date  . A-fib ([place])   . Cataracts, bilateral   . Chronic dermatitis   . Diabetes type 2, controlled ([place]) 2000  . History of chicken pox   . Mumps    Adult  . Retinopathy   . Shingles   . Stroke [facility]) Nov 2011  . Undescended testicle, unilateral    Right  . Whooping cough     Past Surgical History:  Procedure Laterality Date  . CARDIAC VALVE REPLACEMENT    . PRE-MALIGNANT / BENIGN SKIN LESION EXCISION    . TESTICLE SURGERY     Right, undescended  . TOOTH EXTRACTION      There were no vitals filed for this visit.       Subjective Assessment - 12/23/16 1416    Subjective Pt reports multiple hospitalizations over the past year (UTI, PNA, sepsis) with HH PT until ~2 weeks ago. Recommended by [facility] PT to continue with OP PT for balance and improving gait pattern.   Patient is accompained by: Family member  Wife - Elle & paid caregiver - Arts development officer   Pertinent History Mutiple hospitalizations overpast year for urinary sepsis & PNA; recurrent UTI's secondary renal insufficieny requiring catheterization by wife; DM; A-fib: h/o CVA 2011   Limitations Walking   How long can you walk comfortably? 100 ft outdoors   Patient Stated Goals "To improve my balance &  make my hip hurt less"   Currently in Pain? Yes   Pain Score 1   up to 6-7/10   Pain Location Hip   Pain Orientation Right   Pain Descriptors / Indicators Aching   Pain Type Acute pain   Pain Radiating Towards R buttock & lateral thigh   Pain Onset More than a month ago  Oct/Nov 2017   Pain Frequency Intermittent   Aggravating Factors  walking (esp with short strides)   Pain Relieving Factors rest ("get off it")   Effect of Pain on Daily Activities limits walking tolerance            [facility] PT Assessment - 12/23/16 1410      Assessment   Medical Diagnosis Gait instability   Referring Provider [name], PA-C   Onset Date/Surgical Date --  chronic   Next MD Visit TBD   Prior Therapy 01/15/16-02/22/16 OP PT for physical deconditioning; Mutiple episodes of HHPT in the past year     Precautions   Precautions Fall     Balance Screen   Has the patient fallen in the past 6 months No  last fall in July 2017   Has the patient had a decrease in activity level because of a fear of falling?  No   Is  the patient reluctant to leave their home because of a fear of falling?  No     Home Ecologist residence   Living Arrangements Spouse/significant other;Other relatives   Available Help at Discharge Family;Personal care attendant  9 hrs/day x 5days/wk - Sharyn Lull   Type of [place] to enter   Entrance Stairs-Number of Steps 1   Home Layout One level   [place] - 2 wheels;Cane - single point;Grab bars - toilet;Grab bars - tub/shower;Hand held shower head;Tub bench;Toilet riser     Prior Function   Level of Independence Independent with gait;Independent with transfers;Needs assistance with ADLs  supervision for safety   Vocation Retired   Leisure HH PT HEP ~3x/wk; walking around house daily     Observation/Other Assessments   Focus on Therapeutic Outcomes (FOTO)  Neuromuscular disorder - 52% (48% limitation);  predicted 59% (41% limitation)     ROM / Strength   AROM / PROM / Strength Strength     Strength   Strength Assessment Site Hip;Knee;Ankle   Right/Left Hip Right;Left   Right Hip Flexion 3+/5   Right Hip Extension 3+/5   Right Hip External Rotation  3+/5   Right Hip Internal Rotation 4-/5   Right Hip ABduction 4-/5   Right Hip ADduction 4-/5   Left Hip Flexion 4-/5   Left Hip Extension 3+/5   Left Hip External Rotation 4-/5   Left Hip Internal Rotation 4-/5   Left Hip ABduction 4-/5   Left Hip ADduction 4-/5   Right/Left Knee Right;Left   Right Knee Flexion 4-/5   Right Knee Extension 4/5   Left Knee Flexion 4-/5   Left Knee Extension 4/5   Right/Left Ankle Right;Left   Right Ankle Dorsiflexion 4-/5   Left Ankle Dorsiflexion 4-/5     Ambulation/Gait   Ambulation/Gait Yes   Ambulation/Gait Assistance 5: Supervision   Ambulation Distance (Feet) 80 Feet   Assistive device None   Gait Pattern Decreased step length - right;Decreased stance time - right;Decreased hip/knee flexion - right;Decreased hip/knee flexion - left;Poor foot clearance - left;Poor foot clearance - right   Ambulation Surface Level;Indoor   Gait velocity 2.16 ft/sec  limited community ambulator     Standardized Balance Assessment   Standardized Balance Assessment Timed Up and Go Test;10 meter walk test   10 Meter Walk 15.16     Timed Up and Go Test   TUG Normal TUG;Manual TUG;Cognitive TUG   Normal TUG (seconds) 13.29   Manual TUG (seconds) 15.25   Cognitive TUG (seconds) 16.86   TUG Comments High fall risk indicated by manual TUG > 14.5 sec & cognitive TUG >15 sec                            PT Education - 12/23/16 1837    Education provided Yes   Education Details PT evals findings, need for further balance testing, & anticipated POC   Person(s) Educated Patient;Spouse;Caregiver(s)   Methods Explanation   Comprehension Verbalized understanding          PT Short Term  Goals - 12/23/16 1449      PT SHORT TERM GOAL #1   Title Complete balance testing via Merrilee Jansky +/- DGI/FGA as indicated by 01/06/17   Status New     PT SHORT TERM GOAL #2   Title Pt & caregiver will be independent with initial HEP by 01/13/17  Status New           PT Long Term Goals - 12/23/16 1449      PT LONG TERM GOAL #1   Title Pt & caregiver will be independent with advanced HEP as indicated by 02/21/17   Status New     PT LONG TERM GOAL #2   Title Pt will improve bilateral LE strength to 4/5 or greater by 02/21/17   Status New     PT LONG TERM GOAL #3   Title Pt will improve gait speed to 3.0 ft/sec or greater for improved safety with community ambulation by 02/21/17   Status New     PT LONG TERM GOAL #4   Title Pt will demonstrate improvement on the Berg Balance Scale to (TBD) or greater to reduce risk for falls by 02/21/17   Status New     PT LONG TERM GOAL #5   Title Pt will complete the normal, manual & cogitive TUGs to within 2 sec difference in time to reduce risk for falls by 02/21/17   Status New               Plan - 12/23/16 1449    Clinical Impression Statement Gerald Holt is a 83 y/o male who presents for a moderately complex eval for gait instability following multiple hospitalizations within the past year. He has received HH PT in Oct/Nov 2017 and again more recently completed a HH episode ~2 weeks ago following his most recent hospitalization in Feb 2018 for recurrent UTI and PNA. Pt presents to OP PT w/o AD but has intermittently used a cane for ambulation with wife reporting pt tends to use furniture for ambulation currently at home. Assessment revealed mild to moderate bilateral LE weakness, proximal > distal & R>L; with probable limited flexibility in proximal musculature based on posture, although assessment limited by time constraints. Gait speed was decreased to 2.16 ft/sec indicating a limited community ambulatory functional level and manual & cognitive TUG  times indicating a high risk for falls. Further balance testing via the [facility] Scale +/- DGI/FGA indicated on next visit but unable to be completed today due pt's late arrival. Due to the LE weakness, reported lack of balance and gait instability, pt and wife report pt has not been able to participate in the activities he normally enjoys such as walking in the neighborhood up to 1 mile per day. Pt demonstrates good potential to benefit from skilled PT for LE strengthening, balance and coordination training, and dynamic gait training to improve safety with gait and mobility and reduce risk for falls.   Rehab Potential Good   Clinical Impairments Affecting Rehab Potential Mutiple hospitalizations overpast year for urinary sepsis & PNA; recurrent UTI's secondary renal insufficieny requiring catheterization by wife; DM; A-fib: h/o CVA 2011   PT Frequency 2x / week   PT Duration 8 weeks   PT Treatment/Interventions Patient/family education;ADLs/Self Care Home Management;Therapeutic exercise;Therapeutic activities;Functional mobility training;Gait training;Neuromuscular re-education;Balance training;Manual techniques   PT Next Visit Plan Complete assessment - LE flexibility & balance testing via Merrilee Jansky +/- DGI/FGA   Consulted and Agree with Plan of Care Patient;Family member/caregiver   Family Member Consulted Wife - Elle & caregiver - Sharyn Lull      Patient will benefit from skilled therapeutic intervention in order to improve the following deficits and impairments:  Decreased balance, Abnormal gait, Difficulty walking, Pain, Decreased strength, Impaired flexibility, Decreased endurance, Decreased activity tolerance, Decreased knowledge of use of DME  Visit Diagnosis: Unsteadiness  on feet - Plan: PT plan of care cert/re-cert  Other abnormalities of gait and mobility - Plan: PT plan of care cert/re-cert  Difficulty in walking, not elsewhere classified - Plan: PT plan of care cert/re-cert  Muscle  weakness (generalized) - Plan: PT plan of care cert/re-cert      G-Codes - 76/73/41 1908    Functional Assessment Tool Used (Outpatient Only) Clinical judgment based on gait speed & TUG times   Functional Limitation Mobility: Walking and moving around   Mobility: Walking and Moving Around Current Status ([id number]) At least 40 percent but less than 60 percent impaired, limited or restricted   Mobility: Walking and Moving Around Goal Status [phone]) At least 20 percent but less than 40 percent impaired, limited or restricted       Problem List Patient Active Problem List   Diagnosis Date Noted  . Intermittent self-catheterization of bladder 11/22/2016  . Enlarged prostate 11/22/2016  . PNA (pneumonia) 10/31/2016  . HCAP (healthcare-associated pneumonia) 10/31/2016  . Urinary retention 10/31/2016  . Diabetes mellitus with complication ([place])   . Lobar pneumonia ([place])   . Renal insufficiency 10/25/2016  . Encounter for therapeutic drug monitoring 10/20/2016  . Hx: UTI (urinary tract infection) 10/20/2016  . Hypothyroidism 09/15/2016  . Gastroesophageal reflux disease without esophagitis 07/09/2015  . Falls 06/22/2015  . Diabetes mellitus type II, uncontrolled ([place]) 05/29/2015  . Paroxysmal atrial fibrillation ([place]) 05/29/2015  . Hyperlipidemia 05/29/2015  . Hx of completed stroke 05/29/2015    Percival Spanish, PT, MPT 12/23/2016, 7:55 PM  [facility] [address]  [place], [place], [address] Phone: [phone]   Fax:  [phone]  Name: Gerald Holt MRN: [id number] Date of Birth: Jan 19, 1924

## 2016-12-24 ENCOUNTER — Other Ambulatory Visit: Payer: Self-pay | Admitting: Physician Assistant

## 2016-12-24 MED ORDER — OSELTAMIVIR PHOSPHATE 30 MG PO CAPS: 30.0000 mg | ORAL_CAPSULE | Freq: Every day | ORAL | 0 refills | Status: DC

## 2016-12-25 ENCOUNTER — Ambulatory Visit: Payer: Medicare Other | Admitting: Physical Therapy

## 2016-12-27 ENCOUNTER — Telehealth: Payer: Self-pay | Admitting: *Deleted

## 2016-12-27 ENCOUNTER — Emergency Department (HOSPITAL_BASED_OUTPATIENT_CLINIC_OR_DEPARTMENT_OTHER)
Admission: EM | Admit: 2016-12-27 | Discharge: 2016-12-27 | Disposition: A | Discharge status: Home / Self Care | Payer: Medicare Other | Attending: Physician Assistant | Admitting: Physician Assistant

## 2016-12-27 ENCOUNTER — Encounter: Payer: Self-pay | Admitting: Physician Assistant

## 2016-12-27 ENCOUNTER — Encounter (HOSPITAL_BASED_OUTPATIENT_CLINIC_OR_DEPARTMENT_OTHER): Payer: Self-pay | Admitting: *Deleted

## 2016-12-27 ENCOUNTER — Emergency Department (HOSPITAL_BASED_OUTPATIENT_CLINIC_OR_DEPARTMENT_OTHER): Payer: Medicare Other

## 2016-12-27 DIAGNOSIS — Z7984 Long term (current) use of oral hypoglycemic drugs: Secondary | ICD-10-CM | POA: Insufficient documentation

## 2016-12-27 DIAGNOSIS — Z87891 Personal history of nicotine dependence: Secondary | ICD-10-CM | POA: Insufficient documentation

## 2016-12-27 DIAGNOSIS — E119 Type 2 diabetes mellitus without complications: Secondary | ICD-10-CM | POA: Insufficient documentation

## 2016-12-27 DIAGNOSIS — R5383 Other fatigue: Secondary | ICD-10-CM | POA: Diagnosis not present

## 2016-12-27 DIAGNOSIS — Z7982 Long term (current) use of aspirin: Secondary | ICD-10-CM | POA: Diagnosis not present

## 2016-12-27 DIAGNOSIS — E039 Hypothyroidism, unspecified: Secondary | ICD-10-CM | POA: Insufficient documentation

## 2016-12-27 DIAGNOSIS — B349 Viral infection, unspecified: Secondary | ICD-10-CM | POA: Diagnosis not present

## 2016-12-27 DIAGNOSIS — R531 Weakness: Secondary | ICD-10-CM | POA: Diagnosis not present

## 2016-12-27 DIAGNOSIS — R509 Fever, unspecified: Secondary | ICD-10-CM | POA: Diagnosis present

## 2016-12-27 LAB — URINALYSIS, ROUTINE W REFLEX MICROSCOPIC
BILIRUBIN URINE: NEGATIVE
Glucose, UA: NEGATIVE mg/dL
Hgb urine dipstick: NEGATIVE
KETONES UR: NEGATIVE mg/dL
Leukocytes, UA: NEGATIVE
NITRITE: NEGATIVE
PH: 7 (ref 5.0–8.0)
Protein, ur: NEGATIVE mg/dL
Specific Gravity, Urine: 1.009 (ref 1.005–1.030)

## 2016-12-27 LAB — COMPREHENSIVE METABOLIC PANEL
ALBUMIN: 3.8 g/dL (ref 3.5–5.0)
ALT: 19 U/L (ref 17–63)
ANION GAP: 11 (ref 5–15)
AST: 25 U/L (ref 15–41)
Alkaline Phosphatase: 45 U/L (ref 38–126)
BUN: 14 mg/dL (ref 6–20)
CHLORIDE: 96 mmol/L — AB (ref 101–111)
CO2: 26 mmol/L (ref 22–32)
Calcium: 9.6 mg/dL (ref 8.9–10.3)
Creatinine, Ser: 1.07 mg/dL (ref 0.61–1.24)
GFR calc Af Amer: >60 mL/min (ref >60)
GFR calc non Af Amer: 59 mL/min — ABNORMAL LOW (ref >60)
GLUCOSE: 192 mg/dL — AB (ref 65–99)
POTASSIUM: 4.2 mmol/L (ref 3.5–5.1)
SODIUM: 133 mmol/L — AB (ref 135–145)
TOTAL PROTEIN: 7.3 g/dL (ref 6.5–8.1)
Total Bilirubin: 0.7 mg/dL (ref 0.3–1.2)

## 2016-12-27 LAB — CBC WITH DIFFERENTIAL/PLATELET
BASOS ABS: 0.1 10*3/uL (ref 0.0–0.1)
Basophils Relative: 1 %
Eosinophils Absolute: 0.6 10*3/uL (ref 0.0–0.7)
Eosinophils Relative: 9 %
HEMATOCRIT: 36.9 % — AB (ref 39.0–52.0)
Hemoglobin: 12.7 g/dL — ABNORMAL LOW (ref 13.0–17.0)
LYMPHS PCT: 10 %
Lymphs Abs: 0.8 10*3/uL (ref 0.7–4.0)
MCH: 29.5 pg (ref 26.0–34.0)
MCHC: 34.4 g/dL (ref 30.0–36.0)
MCV: 85.6 fL (ref 78.0–100.0)
MONO ABS: 1 10*3/uL (ref 0.1–1.0)
Monocytes Relative: 13 %
NEUTROS ABS: 4.9 10*3/uL (ref 1.7–7.7)
Neutrophils Relative %: 67 %
PLATELETS: 191 10*3/uL (ref 150–400)
RBC: 4.31 MIL/uL (ref 4.22–5.81)
RDW: 14.2 % (ref 11.5–15.5)
WBC: 7.3 10*3/uL (ref 4.0–10.5)

## 2016-12-27 MED ORDER — OSELTAMIVIR PHOSPHATE 75 MG PO CAPS: 75.0000 mg | ORAL_CAPSULE | Freq: Two times a day (BID) | ORAL | 0 refills | Status: DC

## 2016-12-27 NOTE — ED Provider Notes (Signed)
[place] DEPT [facility] Provider Note   CSN: [id number] Arrival date & time: 12/27/16  0263     History   Chief Complaint Chief Complaint  Patient presents with  . Fever    HPI Gerald Holt is a 83 y.o. male.  HPI   Patient is a 83 year old male with past medical history significant for neurogenic bladder, stroke, diabetes, proximal A. fib. Patient is presenting after his wife called the family practice physician and was told to bring him to the emergency department. Wife and sister both have the flu currently. Patient was placed prophylactically on Tamiflu 2 days ago. Patient's wife said he ran a fever of 99.1 today. She called the primary care and was told to bring him here to the emergency department. Patient's had no symptoms except fatigue.  Patient does have history of UTIs, sepsis. Patient is finishing treatment with Augmentin for a UTI. According to wife this antibiotic choice made by infectious disease specialist.  Past Medical History:  Diagnosis Date  . A-fib ([place])   . Cataracts, bilateral   . Chronic dermatitis   . Diabetes type 2, controlled ([place]) 2000  . History of chicken pox   . Mumps    Adult  . Retinopathy   . Shingles   . Stroke [facility]) Nov 2011  . Undescended testicle, unilateral    Right  . Whooping cough     Patient Active Problem List   Diagnosis Date Noted  . Intermittent self-catheterization of bladder 11/22/2016  . Enlarged prostate 11/22/2016  . PNA (pneumonia) 10/31/2016  . HCAP (healthcare-associated pneumonia) 10/31/2016  . Urinary retention 10/31/2016  . Diabetes mellitus with complication ([place])   . Lobar pneumonia ([place])   . Renal insufficiency 10/25/2016  . Encounter for therapeutic drug monitoring 10/20/2016  . Hx: UTI (urinary tract infection) 10/20/2016  . Hypothyroidism 09/15/2016  . Gastroesophageal reflux disease without esophagitis 07/09/2015  . Falls 06/22/2015  . Diabetes mellitus type II, uncontrolled ([place]) 05/29/2015    . Paroxysmal atrial fibrillation ([place]) 05/29/2015  . Hyperlipidemia 05/29/2015  . Hx of completed stroke 05/29/2015    Past Surgical History:  Procedure Laterality Date  . CARDIAC VALVE REPLACEMENT    . PRE-MALIGNANT / BENIGN SKIN LESION EXCISION    . TESTICLE SURGERY     Right, undescended  . TOOTH EXTRACTION         Home Medications    Prior to Admission medications   Medication Sig Start Date End Date Taking? Authorizing Provider  amoxicillin-clavulanate (AUGMENTIN) 500-125 MG tablet Take 1 tablet (500 mg total) by mouth 2 (two) times daily. 12/20/16   [name], PA-C  aspirin 81 MG tablet Take 1 tablet (81 mg total) by mouth daily. 06/22/15   Jerline Pain, MD  Blood Glucose Monitoring Suppl (RELION PRIME MONITOR) DEVI Use to check fasting sugars daily. E 11.9 11/18/16   [name], PA-C  Calcium Carbonate Antacid (ALKA-SELTZER ANTACID PO) Take 2 tablets by mouth as needed (for indegestion).     Historical Provider, MD  cyanocobalamin (,VITAMIN B-12,) 1000 MCG/ML injection Inject 1000 mcg intramuscular daily for 7 days, then once a week for 4 weeks, then once a month for a year Patient taking differently: Inject 1,000 mcg into the muscle every 30 (thirty) days.  04/02/16   Pieter Partridge, DO  docusate sodium (COLACE) 100 MG capsule Take 100-200 mg by mouth See admin instructions. Take 100 mg by mouth daily for 2 days, then on every third day take 200  mg by mouth daily. Alternate 100 mg and 200 mg    Historical Provider, MD  ELDERBERRY PO Take 1 tablet by mouth daily.    Historical Provider, MD  Eyelid Cleansers (STERILID EX) Apply 1 application topically See admin instructions. Eye Wash: Once Daily     Historical Provider, MD  glipiZIDE (GLUCOTROL XL) 2.5 MG 24 hr tablet Take 1 tablet (2.5 mg total) by mouth daily with breakfast. 10/17/16   [name], PA-C  glucose blood test strip One Touch Verio strips Use as instructed to check fasting sugars once daily .Dx:E11.9  11/20/16   [name], PA-C  levothyroxine (SYNTHROID, LEVOTHROID) 50 MCG tablet Take 1 tablet (50 mcg total) by mouth daily. 04/09/16   [name], PA-C  lovastatin (MEVACOR) 10 MG tablet Take 5 mg by mouth at bedtime.  08/21/16   Historical Provider, MD  metFORMIN (GLUCOPHAGE-XR) 500 MG 24 hr tablet Take 2 tablets with breakfast and at bedtime Patient taking differently: Take 1,000 mg by mouth 2 (two) times daily.  08/21/16   [name], PA-C  metoprolol tartrate (LOPRESSOR) 25 MG tablet Take 0.5 tablets (12.5 mg total) by mouth 2 (two) times daily. 04/09/16   [name], PA-C  Miconazole Nitrate (TRIPLE PASTE AF) 2 % OINT Apply 1 application topically once a week.     Historical Provider, MD  Omega-3 Fatty Acids (SB OMEGA-3 FISH OIL) 1000 MG CAPS Take 1,000 mg by mouth 2 (two) times daily. Reported on 10/06/2015    Historical Provider, MD  oseltamivir (TAMIFLU) 30 MG capsule Take 1 capsule (30 mg total) by mouth daily. 12/24/16   [name], PA-C  OVER THE COUNTER MEDICATION Take 1 capsule by mouth 2 (two) times daily. OTC Focus Select    Historical Provider, MD  Probiotic Product (PROBIOTIC-10 PO) Take 1 capsule by mouth daily.    Historical Provider, MD  triamcinolone cream (KENALOG) 0.1 % Apply 1 application topically as needed (for itching).  02/01/16   Historical Provider, MD    Family History Family History  Problem Relation Age of Onset  . Pneumonia Mother 61    Deceased  . GI Bleed Father 87    Deceased - Ulcers  . Diabetes Cousin   . Diabetes Brother   . Cancer Paternal Aunt     Social History Social History  Substance Use Topics  . Smoking status: Former Research scientist (life sciences)  . Smokeless tobacco: Never Used     Comment: Hasn't smoked since age 60  . Alcohol use No     Allergies   Flomax [tamsulosin hcl]; Shellfish allergy; Aspirin-dipyridamole er; and Other   Review of Systems Review of Systems  Constitutional: Positive for fatigue. Negative for  activity change and fever.  HENT: Negative for congestion.   Respiratory: Negative for cough and shortness of breath.   Cardiovascular: Negative for chest pain.  Gastrointestinal: Negative for abdominal pain.  Genitourinary: Negative for dysuria.  Musculoskeletal: Negative for arthralgias and myalgias.  Neurological: Negative for light-headedness.  Psychiatric/Behavioral: Negative for agitation.  All other systems reviewed and are negative.    Physical Exam Updated Vital Signs BP 128/74 (BP Location: Right Arm)   Pulse 96   Temp 98.7 F (37.1 C) (Oral)   Resp 18   Ht 5\' 6"  (1.676 m)   Wt 161 lb (73 kg)   SpO2 98%   BMI 25.99 kg/m   Physical Exam  Constitutional: He is oriented to person, place, and time. He appears well-nourished.  HENT:  Head: Normocephalic and atraumatic.  Right Ear: External ear normal.  Left Ear: External ear normal.  Nose: Nose normal.  Mouth/Throat: No oropharyngeal exudate.  Eyes: Conjunctivae and EOM are normal. Right eye exhibits no discharge. Left eye exhibits no discharge.  Cardiovascular: Normal rate and regular rhythm.   Murmur heard. Pulmonary/Chest: Effort normal and breath sounds normal. No respiratory distress. He has no wheezes.  Abdominal: Soft. He exhibits no distension. There is no tenderness.  Neurological: He is oriented to person, place, and time.  Skin: Skin is warm and dry. He is not diaphoretic.  Psychiatric: He has a normal mood and affect. His behavior is normal.     ED Treatments / Results  Labs (all labs ordered are listed, but only abnormal results are displayed) Labs Reviewed  URINE CULTURE  URINALYSIS, ROUTINE W REFLEX MICROSCOPIC  COMPREHENSIVE METABOLIC PANEL  CBC WITH DIFFERENTIAL/PLATELET    EKG  EKG Interpretation  Date/Time:  Friday December 27 2016 09:55:37 EDT Ventricular Rate:  94 PR Interval:    QRS Duration: 139 QT Interval:  393 QTC Calculation: 492 R Axis:   70 Text Interpretation:  Sinus  rhythm Right bundle branch block Baseline wander in lead(s) V3 Right bundle branch block Confirmed by Thomasene Lot, COURTNEY ([id number]) on 12/27/2016 10:01:30 AM       Radiology No results found.  Procedures Procedures (including critical care time)  Medications Ordered in ED Medications - No data to display   Initial Impression / Assessment and Plan / ED Course  I have reviewed the triage vital signs and the nursing notes.  Pertinent labs & imaging results that were available during my care of the patient were reviewed by me and considered in my medical decision making (see chart for details).     She is a very well-appearing 83 year old male presenting with Tmaxof 99.1. Patient's already on Tamiflu for prophylaxis for flu. Additionally patient's already taking Augmentin for urinary tract infection. We will get chest x-ray, labs and send the urine for culture. However given the patient is not febrile and has normal vital signs do not suspect sepsis.  12:39 PM X-ray negative urine negative no white count. Patient does have a 99.9 temperature. By wife is very concerned. I went ahead and touch base with Dr. Ledell Peoples her PCP. We will give them the correct dose of Tamiflu in case he needs it. However we will tell her to call the office and discussed the symptoms with PCP before starting it.  Patient continues to appear well, comfortable, not Not tachycardic not hypoxic.     Final Clinical Impressions(s) / ED Diagnoses   Final diagnoses:  None    New Prescriptions New Prescriptions   No medications on file     Roselynne Lortz Julio Alm, MD 12/27/16 1240

## 2016-12-27 NOTE — ED Triage Notes (Signed)
Pt wife reports pt with lethargy, "just not feeling himself". Pt reports she and her sister are being treated for the flu, and although he was not showing any symptoms at the time pt was started on tamiflu on Tuesday as prophylaxis. Pt is also catheterized 5 times daily by his wife, and she wonders if he is getting a uti, states his temp today was 99.

## 2016-12-27 NOTE — ED Notes (Signed)
Patient transported to X-ray 

## 2016-12-27 NOTE — Telephone Encounter (Signed)
Spoke with sister-in-law and wife regarding current symptoms.   Pt states that he is somewhat weak, he is running a fever and congested.   After conversation with PCP,   I advised them to take him to the ER for assessment due to age and medical history.   At the end of the conversation, wife stated that they would take him to the hospital.

## 2016-12-27 NOTE — Discharge Instructions (Signed)
Gerald Holt has no signs of infection. He has no white count suggestive of infection. He has no fever here. Continue to monitor his behavior at home. His urine appears clean today. This checks x-ray shows no infection. He could certainly develop flu, or any other infection in the coming days. So please monitor.  We have given you the prescription for Tamiflu if he does develop flu. You might think he develops the flu if he has high fevers or body aches.

## 2016-12-28 LAB — URINE CULTURE: Culture: NO GROWTH

## 2016-12-30 ENCOUNTER — Ambulatory Visit: Payer: Medicare Other | Admitting: Physical Therapy

## 2016-12-30 ENCOUNTER — Encounter: Payer: Self-pay | Admitting: Physician Assistant

## 2017-01-02 ENCOUNTER — Ambulatory Visit: Payer: Medicare Other

## 2017-01-02 ENCOUNTER — Encounter: Payer: Self-pay | Admitting: Physician Assistant

## 2017-01-04 ENCOUNTER — Encounter: Payer: Self-pay | Admitting: Physician Assistant

## 2017-01-04 DIAGNOSIS — N390 Urinary tract infection, site not specified: Secondary | ICD-10-CM | POA: Diagnosis not present

## 2017-01-06 ENCOUNTER — Ambulatory Visit: Payer: Medicare Other | Admitting: Physical Therapy

## 2017-01-09 ENCOUNTER — Ambulatory Visit: Payer: Medicare Other | Admitting: Physical Therapy

## 2017-01-10 ENCOUNTER — Encounter: Payer: Self-pay | Admitting: Physical Therapy

## 2017-01-13 ENCOUNTER — Ambulatory Visit: Payer: Medicare Other | Admitting: Physical Therapy

## 2017-01-16 ENCOUNTER — Ambulatory Visit: Payer: Medicare Other

## 2017-01-20 ENCOUNTER — Ambulatory Visit: Payer: Medicare Other | Admitting: Physical Therapy

## 2017-01-20 DIAGNOSIS — R2681 Unsteadiness on feet: Secondary | ICD-10-CM

## 2017-01-20 DIAGNOSIS — R2689 Other abnormalities of gait and mobility: Secondary | ICD-10-CM

## 2017-01-20 DIAGNOSIS — R262 Difficulty in walking, not elsewhere classified: Secondary | ICD-10-CM

## 2017-01-20 DIAGNOSIS — M6281 Muscle weakness (generalized): Secondary | ICD-10-CM

## 2017-01-20 NOTE — Therapy (Signed)
[facility] [address]  [place], [place], [address] Phone: [phone]   Fax:  [phone]  Physical Therapy Treatment  Patient Details  Name: Gerald Holt MRN: [id number] Date of Birth: 1923/11/13 Referring Provider: [name], PA-C  Encounter Date: 01/20/2017      PT End of Session - 01/20/17 1310    Visit Number 2   Number of Visits 16   Date for PT Re-Evaluation 03/21/17   Authorization Type Medicare / AARP   PT Start Time 1310   PT Stop Time 1356   PT Time Calculation (min) 46 min   Activity Tolerance Patient tolerated treatment well   Behavior During Therapy [facility] for tasks assessed/performed      Past Medical History:  Diagnosis Date  . A-fib ([place])   . Cataracts, bilateral   . Chronic dermatitis   . Diabetes type 2, controlled ([place]) 2000  . History of chicken pox   . Mumps    Adult  . Retinopathy   . Shingles   . Stroke [facility]) Nov 2011  . Undescended testicle, unilateral    Right  . Whooping cough     Past Surgical History:  Procedure Laterality Date  . CARDIAC VALVE REPLACEMENT    . PRE-MALIGNANT / BENIGN SKIN LESION EXCISION    . TESTICLE SURGERY     Right, undescended  . TOOTH EXTRACTION      There were no vitals filed for this visit.      Subjective Assessment - 01/20/17 1312    Subjective Pt returnig to PT after once month absence due to illness/flu. Reports hip pain is currently "off and on", but not hurting today.   Pertinent History Mutiple hospitalizations overpast year for urinary sepsis & PNA; recurrent UTI's secondary renal insufficieny requiring catheterization by wife; DM; A-fib: h/o CVA 2011   Patient Stated Goals "To improve my balance & make my hip hurt less"   Currently in Pain? No/denies            [facility] PT Assessment - 01/20/17 1310      Assessment   Medical Diagnosis Gait instability   Referring Provider [name], PA-C   Onset  Date/Surgical Date --  chronic   Next MD Visit TBD     Strength   Overall Strength Comments tested in sitting   Right Hip Flexion 3+/5   Right Hip Extension 3/5   Right Hip External Rotation  3+/5   Right Hip Internal Rotation 4-/5   Right Hip ABduction 4-/5   Right Hip ADduction 4-/5   Left Hip Flexion 3+/5   Left Hip Extension 3/5   Left Hip External Rotation 4-/5   Left Hip Internal Rotation 4-/5   Left Hip ABduction 4-/5   Left Hip ADduction 4-/5   Right Knee Flexion 4-/5   Right Knee Extension 4/5   Left Knee Flexion 4-/5   Left Knee Extension 4/5   Right Ankle Dorsiflexion 4-/5   Left Ankle Dorsiflexion 4-/5     Ambulation/Gait   Gait Pattern Decreased step length - right;Decreased step length - left;Decreased hip/knee flexion - right;Decreased hip/knee flexion - left;Poor foot clearance - left;Poor foot clearance - right   Ambulation Surface Level;Indoor   Gait velocity 2.20 ft/sec     Standardized Balance Assessment   Standardized Balance Assessment Berg Balance Test;Timed Up and Go Test;10 meter walk test;Dynamic Gait Index   10 Meter Walk 14.90     Merrilee Jansky  Balance Test   Sit to Stand Able to stand  independently using hands   Standing Unsupported Able to stand 2 minutes with supervision   Sitting with Back Unsupported but Feet Supported on Floor or Stool Able to sit safely and securely 2 minutes   Stand to Sit Controls descent by using hands   Transfers Able to transfer safely, minor use of hands   Standing Unsupported with Eyes Closed Able to stand 10 seconds with supervision   Standing Ubsupported with Feet Together Able to place feet together independently and stand for 1 minute with supervision   From Standing, Reach Forward with Outstretched Arm Can reach forward >5 cm safely (2")   From Standing Position, Pick up Object from Floor Able to pick up shoe, needs supervision   From Standing Position, Turn to Look Behind Over each Shoulder Turn sideways only but  maintains balance   Turn 360 Degrees Needs close supervision or verbal cueing   Standing Unsupported, Alternately Place Feet on Step/Stool Able to complete 4 steps without aid or supervision   Standing Unsupported, One Foot in Front Needs help to step but can hold 15 seconds   Standing on One Leg Tries to lift leg/unable to hold 3 seconds but remains standing independently   Total Score 35     Dynamic Gait Index   Level Surface Mild Impairment   Change in Gait Speed Moderate Impairment   Gait with Horizontal Head Turns Moderate Impairment   Gait with Vertical Head Turns Mild Impairment   Gait and Pivot Turn Moderate Impairment   Step Over Obstacle Moderate Impairment   Step Around Obstacles Mild Impairment   Steps Severe Impairment   Total Score 10     Timed Up and Go Test   Normal TUG (seconds) 18.88   Manual TUG (seconds) 19.37   Cognitive TUG (seconds) 21.5                               PT Short Term Goals - 01/20/17 1608      PT SHORT TERM GOAL #1   Title Complete balance testing via Merrilee Jansky +/- DGI/FGA as indicated by 01/06/17   Status Achieved     PT SHORT TERM GOAL #2   Title Pt & caregiver will be independent with initial HEP by 02/07/17   Status Revised           PT Long Term Goals - 01/20/17 1356      PT LONG TERM GOAL #1   Title Pt & caregiver will be independent with advanced HEP as indicated by 03/21/17   Status Revised     PT LONG TERM GOAL #2   Title Pt will improve bilateral LE strength to 4/5 or greater by 03/21/17   Status Revised     PT LONG TERM GOAL #3   Title Pt will improve gait speed to 3.0 ft/sec or greater for improved safety with community ambulation by 03/21/17   Status Revised     PT LONG TERM GOAL #4   Title Pt will demonstrate improvement on the Berg Balance Scale to 44/56 or greater to reduce risk for falls by 03/21/17   Status Revised     PT LONG TERM GOAL #5   Title Pt will complete the normal, manual &  cogitive TUGs to within 2 sec difference in time to reduce risk for falls by 03/21/17   Status Revised  PT LONG TERM GOAL #6   Title Pt will demonstrate improvement on the DGI to 15/24 or greater to reduce risk for falls during gait by 03/21/17   Status New               Plan - 02/11/17 1359    Clinical Impression Statement Pt returning to PT nearly 4 weeks after initial eval, with extended absence due to illness including influenza & UTI. Given extended absence and need for further balance assessment recommended at initial eval, re-eval completed today. Pt's gait speed essentially unchanged but increased evidence of instablity noted. Pt demonstrating slightly increased weakness relative to intial eval findings and slower times on all version of TUG. This along with balance testing performed with Merrilee Jansky & DGI reveal a high risk for falls. Given extended absence due to illness, POC extended by another 4 weeks.   Rehab Potential Good   Clinical Impairments Affecting Rehab Potential Mutiple hospitalizations overpast year for urinary sepsis & PNA; recurrent UTI's secondary renal insufficieny requiring catheterization by wife; DM; A-fib: h/o CVA 2011   PT Treatment/Interventions Patient/family education;ADLs/Self Care Home Management;Therapeutic exercise;Therapeutic activities;Functional mobility training;Gait training;Neuromuscular re-education;Balance training;Manual techniques   PT Next Visit Plan Complete assessment - LE flexibility assessment; [place] Fall Prevention Program   Consulted and Agree with Plan of Care Patient;Family member/caregiver   Family Member Consulted caregiver - Sharyn Lull      Patient will benefit from skilled therapeutic intervention in order to improve the following deficits and impairments:  Decreased balance, Abnormal gait, Difficulty walking, Pain, Decreased strength, Impaired flexibility, Decreased endurance, Decreased activity tolerance, Decreased knowledge of use  of DME  Visit Diagnosis: Unsteadiness on feet  Other abnormalities of gait and mobility  Difficulty in walking, not elsewhere classified  Muscle weakness (generalized)       G-Codes - 02-11-2017 1625    Functional Assessment Tool Used (Outpatient Only) Clinical judgment based on gait speed & TUG times   Functional Limitation Mobility: Walking and moving around   Mobility: Walking and Moving Around Current Status ([id number]) At least 60 percent but less than 80 percent impaired, limited or restricted   Mobility: Walking and Moving Around Goal Status [phone]) At least 20 percent but less than 40 percent impaired, limited or restricted      Problem List Patient Active Problem List   Diagnosis Date Noted  . Intermittent self-catheterization of bladder 11/22/2016  . Enlarged prostate 11/22/2016  . PNA (pneumonia) 10/31/2016  . HCAP (healthcare-associated pneumonia) 10/31/2016  . Urinary retention 10/31/2016  . Diabetes mellitus with complication ([place])   . Lobar pneumonia ([place])   . Renal insufficiency 10/25/2016  . Encounter for therapeutic drug monitoring 10/20/2016  . Hx: UTI (urinary tract infection) 10/20/2016  . Hypothyroidism 09/15/2016  . Gastroesophageal reflux disease without esophagitis 07/09/2015  . Falls 06/22/2015  . Diabetes mellitus type II, uncontrolled ([place]) 05/29/2015  . Paroxysmal atrial fibrillation ([place]) 05/29/2015  . Hyperlipidemia 05/29/2015  . Hx of completed stroke 05/29/2015    Percival Spanish, PT, MPT Feb 11, 2017, 4:29 PM  [facility] [address]  [place], [place], [address] Phone: [phone]   Fax:  [phone]  Name: Cloud Graham MRN: [id number] Date of Birth: 05/25/1924

## 2017-01-22 ENCOUNTER — Ambulatory Visit (INDEPENDENT_AMBULATORY_CARE_PROVIDER_SITE_OTHER): Payer: Medicare Other | Admitting: Physician Assistant

## 2017-01-22 ENCOUNTER — Encounter: Payer: Self-pay | Admitting: Physician Assistant

## 2017-01-22 VITALS — BP 112/64 | HR 60 | Temp 97.8°F | Resp 14 | Ht 66.0 in | Wt 160.0 lb

## 2017-01-22 DIAGNOSIS — R8290 Unspecified abnormal findings in urine: Secondary | ICD-10-CM | POA: Diagnosis not present

## 2017-01-22 DIAGNOSIS — R399 Unspecified symptoms and signs involving the genitourinary system: Secondary | ICD-10-CM | POA: Diagnosis not present

## 2017-01-22 LAB — POCT URINALYSIS DIPSTICK
Bilirubin, UA: NEGATIVE
Glucose, UA: NEGATIVE
Ketones, UA: NEGATIVE
NITRITE UA: NEGATIVE
Protein, UA: NEGATIVE
SPEC GRAV UA: 1.01 (ref 1.010–1.025)
UROBILINOGEN UA: 0.2 U/dL
pH, UA: 6.5 (ref 5.0–8.0)

## 2017-01-22 MED ORDER — AMOXICILLIN-POT CLAVULANATE 500-125 MG PO TABS: 1.0000 | ORAL_TABLET | Freq: Two times a day (BID) | ORAL | 0 refills | Status: DC

## 2017-01-22 NOTE — Progress Notes (Signed)
Pre visit review using our clinic review tool, if applicable. No additional management support is needed unless otherwise documented below in the visit note. 

## 2017-01-22 NOTE — Patient Instructions (Signed)
Your symptoms are consistent with a bladder infection, also called acute cystitis. Please take your antibiotic (Augmentin) as directed until all pills are gone.  Stay very well hydrated.  Consider a daily probiotic (Align, Culturelle, or Activia) to help prevent stomach upset caused by the antibiotic.  Taking a probiotic daily may also help prevent recurrent UTIs.  Also consider taking AZO (Phenazopyridine) tablets to help decrease pain with urination.  I will call you with your urine testing results.  We will change antibiotics if indicated.  Call or return to clinic if symptoms are not resolved by completion of antibiotic.   Follow-up with Dr. Ree Edman Friday as scheduled.  Urinary Tract Infection A urinary tract infection (UTI) can occur any place along the urinary tract. The tract includes the kidneys, ureters, bladder, and urethra. A type of germ called bacteria often causes a UTI. UTIs are often helped with antibiotic medicine.  HOME CARE   If given, take antibiotics as told by your doctor. Finish them even if you start to feel better.  Drink enough fluids to keep your pee (urine) clear or pale yellow.  Avoid tea, drinks with caffeine, and bubbly (carbonated) drinks.  Pee often. Avoid holding your pee in for a long time.  Pee before and after having sex (intercourse).  Wipe from front to back after you poop (bowel movement) if you are a woman. Use each tissue only once. GET HELP RIGHT AWAY IF:   You have back pain.  You have lower belly (abdominal) pain.  You have chills.  You feel sick to your stomach (nauseous).  You throw up (vomit).  Your burning or discomfort with peeing does not go away.  You have a fever.  Your symptoms are not better in 3 days. MAKE SURE YOU:   Understand these instructions.  Will watch your condition.  Will get help right away if you are not doing well or get worse. Document Released: 02/26/2008 Document Revised: 06/03/2012 Document Reviewed:  04/09/2012 [facility] Patient Information 2015 [place], [place]. This information is not intended to replace advice given to you by your health care provider. Make sure you discuss any questions you have with your health care provider.

## 2017-01-22 NOTE — Progress Notes (Signed)
Patient presents to clinic today c/o 2 days of cloudy urine noted during home catheterizations. Has history of UTI without typical presenting symptoms. Has been instructed by Urology to be seen if there are greater than 2 continual cloudy catches.  Denies fever, chills, change in appetite or mentation. Denies nausea, vomiting, abdominal pain, low back pain or flank pain.  Has follow-up scheduled with Urology on Friday.  Past Medical History:  Diagnosis Date  . A-fib ([place])   . Cataracts, bilateral   . Chronic dermatitis   . Diabetes type 2, controlled ([place]) 2000  . History of chicken pox   . Mumps    Adult  . Retinopathy   . Shingles   . Stroke [facility]) Nov 2011  . Undescended testicle, unilateral    Right  . Whooping cough     Current Outpatient Prescriptions on File Prior to Visit  Medication Sig Dispense Refill  . aspirin 81 MG tablet Take 1 tablet (81 mg total) by mouth daily.    . Blood Glucose Monitoring Suppl (RELION PRIME MONITOR) DEVI Use to check fasting sugars daily. E 11.9 1 Device 0  . Calcium Carbonate Antacid (ALKA-SELTZER ANTACID PO) Take 2 tablets by mouth as needed (for indegestion).     . cyanocobalamin (,VITAMIN B-12,) 1000 MCG/ML injection Inject 1000 mcg intramuscular daily for 7 days, then once a week for 4 weeks, then once a month for a year (Patient taking differently: Inject 1,000 mcg into the muscle every 30 (thirty) days. ) 25 mL 0  . docusate sodium (COLACE) 100 MG capsule Take 100-200 mg by mouth See admin instructions. Take 100 mg by mouth daily for 2 days, then on every third day take 200 mg by mouth daily. Alternate 100 mg and 200 mg    . ELDERBERRY PO Take 1 tablet by mouth daily.    . Eyelid Cleansers (STERILID EX) Apply 1 application topically See admin instructions. Eye Wash: Once Daily     . glucose blood test strip One Touch Verio strips Use as instructed to check fasting sugars once daily .Dx:E11.9 100 each 12  . levothyroxine (SYNTHROID,  LEVOTHROID) 50 MCG tablet Take 1 tablet (50 mcg total) by mouth daily. 90 tablet 3  . lovastatin (MEVACOR) 10 MG tablet Take 5 mg by mouth at bedtime.     . metFORMIN (GLUCOPHAGE-XR) 500 MG 24 hr tablet Take 2 tablets with breakfast and at bedtime (Patient taking differently: Take 1,000 mg by mouth 2 (two) times daily. ) 360 tablet 1  . metoprolol tartrate (LOPRESSOR) 25 MG tablet Take 0.5 tablets (12.5 mg total) by mouth 2 (two) times daily. 180 tablet 1  . Miconazole Nitrate (TRIPLE PASTE AF) 2 % OINT Apply 1 application topically once a week.     . Omega-3 Fatty Acids (SB OMEGA-3 FISH OIL) 1000 MG CAPS Take 1,000 mg by mouth 2 (two) times daily. Reported on 10/06/2015    . OVER THE COUNTER MEDICATION Take 1 capsule by mouth 2 (two) times daily. OTC Focus Select    . Probiotic Product (PROBIOTIC-10 PO) Take 1 capsule by mouth daily.    Marland Kitchen triamcinolone cream (KENALOG) 0.1 % Apply 1 application topically as needed (for itching).     Marland Kitchen glipiZIDE (GLUCOTROL XL) 2.5 MG 24 hr tablet Take 1 tablet (2.5 mg total) by mouth daily with breakfast. (Patient not taking: Reported on 01/22/2017) 30 tablet 5   No current facility-administered medications on file prior to visit.     Allergies  Allergen Reactions  . Flomax [Tamsulosin Hcl] Other (See Comments)    Hypotension  . Shellfish Allergy Anaphylaxis  . Aspirin-Dipyridamole Er Other (See Comments)    Headaches   . Other Other (See Comments)    Blood Thinner given in Rehab gave H/As - Not Coumadin/Headaches      Family History  Problem Relation Age of Onset  . Pneumonia Mother 20    Deceased  . GI Bleed Father 53    Deceased - Ulcers  . Diabetes Cousin   . Diabetes Brother   . Cancer Paternal Aunt     Social History   Social History  . Marital status: Married    Spouse name: N/A  . Number of children: N/A  . Years of education: N/A   Social History Main Topics  . Smoking status: Former Research scientist (life sciences)  . Smokeless tobacco: Never Used      Comment: Hasn't smoked since age 8  . Alcohol use No  . Drug use: No  . Sexual activity: Not Asked   Other Topics Concern  . None   Social History Narrative   Lives with wife in a one story home.  Has 1 child.  Retired Armed forces technical officer.  Education: Oceanographer.      Review of Systems - See HPI.  All other ROS are negative.  BP 112/64   Pulse 60   Temp 97.8 F (36.6 C) (Oral)   Resp 14   Ht '5\' 6"'  (1.676 m)   Wt 160 lb (72.6 kg)   SpO2 98%   BMI 25.82 kg/m   Physical Exam  Constitutional: He is oriented to person, place, and time and well-developed, well-nourished, and in no distress.  HENT:  Head: Normocephalic and atraumatic.  Cardiovascular: Normal rate, regular rhythm, normal heart sounds and intact distal pulses.   Pulmonary/Chest: Effort normal.  Abdominal: Soft. Bowel sounds are normal. He exhibits no distension. There is no tenderness.  Negative CVA tenderness.  Neurological: He is alert and oriented to person, place, and time.  Skin: Skin is warm and dry. No rash noted.  Psychiatric: Affect normal.  Vitals reviewed.   Recent Results (from the past 2160 hour(s))  MRSA PCR Screening     Status: None   Collection Time: 10/24/16 11:09 PM  Result Value Ref Range   MRSA by PCR NEGATIVE NEGATIVE    Comment:        The GeneXpert MRSA Assay (FDA approved for NASAL specimens only), is one component of a comprehensive MRSA colonization surveillance program. It is not intended to diagnose MRSA infection nor to guide or monitor treatment for MRSA infections.   Respiratory Panel by PCR     Status: None   Collection Time: 10/25/16 12:44 AM  Result Value Ref Range   Adenovirus NOT DETECTED NOT DETECTED   Coronavirus 229E NOT DETECTED NOT DETECTED   Coronavirus HKU1 NOT DETECTED NOT DETECTED   Coronavirus NL63 NOT DETECTED NOT DETECTED   Coronavirus OC43 NOT DETECTED NOT DETECTED   Metapneumovirus NOT DETECTED NOT DETECTED   Rhinovirus / Enterovirus NOT DETECTED NOT  DETECTED   Influenza A NOT DETECTED NOT DETECTED   Influenza B NOT DETECTED NOT DETECTED   Parainfluenza Virus 1 NOT DETECTED NOT DETECTED   Parainfluenza Virus 2 NOT DETECTED NOT DETECTED   Parainfluenza Virus 3 NOT DETECTED NOT DETECTED   Parainfluenza Virus 4 NOT DETECTED NOT DETECTED   Respiratory Syncytial Virus NOT DETECTED NOT DETECTED   Bordetella pertussis NOT DETECTED NOT DETECTED  Chlamydophila pneumoniae NOT DETECTED NOT DETECTED   Mycoplasma pneumoniae NOT DETECTED NOT DETECTED  Lactic acid, plasma     Status: None   Collection Time: 10/25/16  1:09 AM  Result Value Ref Range   Lactic Acid, Venous 1.2 0.5 - 1.9 mmol/L  CBC     Status: Abnormal   Collection Time: 10/25/16  1:09 AM  Result Value Ref Range   WBC 9.3 4.0 - 10.5 K/uL   RBC 3.50 (L) 4.22 - 5.81 MIL/uL   Hemoglobin 10.5 (L) 13.0 - 17.0 g/dL   HCT 31.1 (L) 39.0 - 52.0 %   MCV 88.9 78.0 - 100.0 fL   MCH 30.0 26.0 - 34.0 pg   MCHC 33.8 30.0 - 36.0 g/dL   RDW 15.4 11.5 - 15.5 %   Platelets 159 150 - 400 K/uL  Basic metabolic panel     Status: Abnormal   Collection Time: 10/25/16  1:09 AM  Result Value Ref Range   Sodium 137 135 - 145 mmol/L   Potassium 4.4 3.5 - 5.1 mmol/L   Chloride 106 101 - 111 mmol/L   CO2 22 22 - 32 mmol/L   Glucose, Bld 151 (H) 65 - 99 mg/dL   BUN 18 6 - 20 mg/dL   Creatinine, Ser 1.07 0.61 - 1.24 mg/dL   Calcium 8.6 (L) 8.9 - 10.3 mg/dL   GFR calc non Af Amer 59 (L) >60 mL/min   GFR calc Af Amer >60 >60 mL/min    Comment: (NOTE) The eGFR has been calculated using the CKD EPI equation. This calculation has not been validated in all clinical situations. eGFR's persistently <60 mL/min signify possible Chronic Kidney Disease.    Anion gap 9 5 - 15  Procalcitonin     Status: None   Collection Time: 10/25/16  1:09 AM  Result Value Ref Range   Procalcitonin 0.17 ng/mL    Comment:        Interpretation: PCT (Procalcitonin) <= 0.5 ng/mL: Systemic infection (sepsis) is not  likely. Local bacterial infection is possible. (NOTE)         ICU PCT Algorithm               Non ICU PCT Algorithm    ----------------------------     ------------------------------         PCT < 0.25 ng/mL                 PCT < 0.1 ng/mL     Stopping of antibiotics            Stopping of antibiotics       strongly encouraged.               strongly encouraged.    ----------------------------     ------------------------------       PCT level decrease by               PCT < 0.25 ng/mL       >= 80% from peak PCT       OR PCT 0.25 - 0.5 ng/mL          Stopping of antibiotics                                             encouraged.     Stopping of antibiotics  encouraged.    ----------------------------     ------------------------------       PCT level decrease by              PCT >= 0.25 ng/mL       < 80% from peak PCT        AND PCT >= 0.5 ng/mL            Continuin g antibiotics                                              encouraged.       Continuing antibiotics            encouraged.    ----------------------------     ------------------------------     PCT level increase compared          PCT > 0.5 ng/mL         with peak PCT AND          PCT >= 0.5 ng/mL             Escalation of antibiotics                                          strongly encouraged.      Escalation of antibiotics        strongly encouraged.   Lactic acid, plasma     Status: None   Collection Time: 10/25/16  3:56 AM  Result Value Ref Range   Lactic Acid, Venous 1.0 0.5 - 1.9 mmol/L  Glucose, capillary     Status: Abnormal   Collection Time: 10/25/16  1:48 PM  Result Value Ref Range   Glucose-Capillary 129 (H) 65 - 99 mg/dL  Glucose, capillary     Status: Abnormal   Collection Time: 10/25/16  4:52 PM  Result Value Ref Range   Glucose-Capillary 162 (H) 65 - 99 mg/dL  Glucose, capillary     Status: Abnormal   Collection Time: 10/25/16  8:51 PM  Result Value Ref Range   Glucose-Capillary 187  (H) 65 - 99 mg/dL  Glucose, capillary     Status: Abnormal   Collection Time: 10/26/16  7:47 AM  Result Value Ref Range   Glucose-Capillary 165 (H) 65 - 99 mg/dL  Glucose, capillary     Status: Abnormal   Collection Time: 10/26/16 11:45 AM  Result Value Ref Range   Glucose-Capillary 168 (H) 65 - 99 mg/dL  Glucose, capillary     Status: Abnormal   Collection Time: 10/26/16  4:54 PM  Result Value Ref Range   Glucose-Capillary 144 (H) 65 - 99 mg/dL  Glucose, capillary     Status: Abnormal   Collection Time: 10/26/16  9:08 PM  Result Value Ref Range   Glucose-Capillary 129 (H) 65 - 99 mg/dL  CBC     Status: Abnormal   Collection Time: 10/27/16  4:42 AM  Result Value Ref Range   WBC 6.9 4.0 - 10.5 K/uL   RBC 3.66 (L) 4.22 - 5.81 MIL/uL   Hemoglobin 10.9 (L) 13.0 - 17.0 g/dL   HCT 32.1 (L) 39.0 - 52.0 %   MCV 87.7 78.0 - 100.0 fL   MCH 29.8 26.0 - 34.0 pg   MCHC 34.0 30.0 - 36.0  g/dL   RDW 15.5 11.5 - 15.5 %   Platelets 199 150 - 400 K/uL  Basic metabolic panel     Status: Abnormal   Collection Time: 10/27/16  4:42 AM  Result Value Ref Range   Sodium 134 (L) 135 - 145 mmol/L   Potassium 4.2 3.5 - 5.1 mmol/L   Chloride 103 101 - 111 mmol/L   CO2 23 22 - 32 mmol/L   Glucose, Bld 165 (H) 65 - 99 mg/dL   BUN 13 6 - 20 mg/dL   Creatinine, Ser 1.17 0.61 - 1.24 mg/dL   Calcium 9.1 8.9 - 10.3 mg/dL   GFR calc non Af Amer 53 (L) >60 mL/min   GFR calc Af Amer >60 >60 mL/min    Comment: (NOTE) The eGFR has been calculated using the CKD EPI equation. This calculation has not been validated in all clinical situations. eGFR's persistently <60 mL/min signify possible Chronic Kidney Disease.    Anion gap 8 5 - 15  Glucose, capillary     Status: Abnormal   Collection Time: 10/27/16  8:06 AM  Result Value Ref Range   Glucose-Capillary 164 (H) 65 - 99 mg/dL  Glucose, capillary     Status: Abnormal   Collection Time: 10/27/16 11:59 AM  Result Value Ref Range   Glucose-Capillary 166 (H)  65 - 99 mg/dL  Urinalysis, Routine w reflex microscopic     Status: Abnormal   Collection Time: 10/31/16 12:37 AM  Result Value Ref Range   Color, Urine YELLOW YELLOW   APPearance CLEAR CLEAR   Specific Gravity, Urine 1.013 1.005 - 1.030   pH 6.5 5.0 - 8.0   Glucose, UA NEGATIVE NEGATIVE mg/dL   Hgb urine dipstick NEGATIVE NEGATIVE   Bilirubin Urine NEGATIVE NEGATIVE   Ketones, ur NEGATIVE NEGATIVE mg/dL   Protein, ur NEGATIVE NEGATIVE mg/dL   Nitrite NEGATIVE NEGATIVE   Leukocytes, UA TRACE (A) NEGATIVE  CBC with Differential/Platelet     Status: Abnormal   Collection Time: 10/31/16 12:37 AM  Result Value Ref Range   WBC 10.2 4.0 - 10.5 K/uL   RBC 3.71 (L) 4.22 - 5.81 MIL/uL   Hemoglobin 11.3 (L) 13.0 - 17.0 g/dL   HCT 33.4 (L) 39.0 - 52.0 %   MCV 90.0 78.0 - 100.0 fL   MCH 30.5 26.0 - 34.0 pg   MCHC 33.8 30.0 - 36.0 g/dL   RDW 15.4 11.5 - 15.5 %   Platelets 261 150 - 400 K/uL   Neutrophils Relative % 53 %   Neutro Abs 5.5 1.7 - 7.7 K/uL   Lymphocytes Relative 20 %   Lymphs Abs 2.0 0.7 - 4.0 K/uL   Monocytes Relative 13 %   Monocytes Absolute 1.3 (H) 0.1 - 1.0 K/uL   Eosinophils Relative 13 %   Eosinophils Absolute 1.4 (H) 0.0 - 0.7 K/uL   Basophils Relative 1 %   Basophils Absolute 0.1 0.0 - 0.1 K/uL  Basic metabolic panel     Status: Abnormal   Collection Time: 10/31/16 12:37 AM  Result Value Ref Range   Sodium 133 (L) 135 - 145 mmol/L   Potassium 4.2 3.5 - 5.1 mmol/L   Chloride 101 101 - 111 mmol/L   CO2 23 22 - 32 mmol/L   Glucose, Bld 155 (H) 65 - 99 mg/dL   BUN 17 6 - 20 mg/dL   Creatinine, Ser 1.01 0.61 - 1.24 mg/dL   Calcium 9.3 8.9 - 10.3 mg/dL   GFR calc non  Af Amer >60 >60 mL/min   GFR calc Af Amer >60 >60 mL/min    Comment: (NOTE) The eGFR has been calculated using the CKD EPI equation. This calculation has not been validated in all clinical situations. eGFR's persistently <60 mL/min signify possible Chronic Kidney Disease.    Anion gap 9 5 - 15   Urinalysis, Microscopic (reflex)     Status: Abnormal   Collection Time: 10/31/16 12:37 AM  Result Value Ref Range   RBC / HPF NONE SEEN 0 - 5 RBC/hpf   WBC, UA 0-5 0 - 5 WBC/hpf   Bacteria, UA RARE (A) NONE SEEN   Squamous Epithelial / LPF 0-5 (A) NONE SEEN  Blood culture (routine x 2)     Status: None   Collection Time: 10/31/16  2:05 AM  Result Value Ref Range   Specimen Description BLOOD RIGHT FOREARM    Special Requests BOTTLES DRAWN AEROBIC AND ANAEROBIC 5CC EACH    Culture      NO GROWTH 5 DAYS Performed at [facility], [place] [address], [place], [place] [address]    Report Status 11/05/2016 FINAL   Blood culture (routine x 2)     Status: None   Collection Time: 10/31/16  2:10 AM  Result Value Ref Range   Specimen Description BLOOD LEFT FOREARM    Special Requests BOTTLES DRAWN AEROBIC AND ANAEROBIC 5CC EACH    Culture      NO GROWTH 5 DAYS Performed at [facility], [place] [address] [place], [place] [address]    Report Status 11/05/2016 FINAL   Glucose, capillary     Status: Abnormal   Collection Time: 10/31/16  4:48 PM  Result Value Ref Range   Glucose-Capillary 146 (H) 65 - 99 mg/dL  Strep pneumoniae urinary antigen     Status: None   Collection Time: 10/31/16  6:30 PM  Result Value Ref Range   Strep Pneumo Urinary Antigen NEGATIVE NEGATIVE    Comment:        Infection due to S. pneumoniae cannot be absolutely ruled out since the antigen present may be below the detection limit of the test.   Glucose, capillary     Status: Abnormal   Collection Time: 10/31/16 10:17 PM  Result Value Ref Range   Glucose-Capillary 135 (H) 65 - 99 mg/dL  Basic metabolic panel     Status: Abnormal   Collection Time: 11/01/16  5:23 AM  Result Value Ref Range   Sodium 133 (L) 135 - 145 mmol/L   Potassium 4.2 3.5 - 5.1 mmol/L   Chloride 100 (L) 101 - 111 mmol/L   CO2 22 22 - 32 mmol/L   Glucose, Bld 169 (H) 65 - 99 mg/dL   BUN 15 6 - 20 mg/dL   Creatinine, Ser 1.15  0.61 - 1.24 mg/dL   Calcium 9.2 8.9 - 10.3 mg/dL   GFR calc non Af Amer 54 (L) >60 mL/min   GFR calc Af Amer >60 >60 mL/min    Comment: (NOTE) The eGFR has been calculated using the CKD EPI equation. This calculation has not been validated in all clinical situations. eGFR's persistently <60 mL/min signify possible Chronic Kidney Disease.    Anion gap 11 5 - 15  CBC     Status: Abnormal   Collection Time: 11/01/16  5:23 AM  Result Value Ref Range   WBC 8.5 4.0 - 10.5 K/uL   RBC 3.82 (L) 4.22 - 5.81 MIL/uL   Hemoglobin 11.2 (  L) 13.0 - 17.0 g/dL   HCT 33.8 (L) 39.0 - 52.0 %   MCV 88.5 78.0 - 100.0 fL   MCH 29.3 26.0 - 34.0 pg   MCHC 33.1 30.0 - 36.0 g/dL   RDW 15.6 (H) 11.5 - 15.5 %   Platelets 243 150 - 400 K/uL  Glucose, capillary     Status: Abnormal   Collection Time: 11/01/16  8:09 AM  Result Value Ref Range   Glucose-Capillary 155 (H) 65 - 99 mg/dL  Glucose, capillary     Status: Abnormal   Collection Time: 11/01/16 12:39 PM  Result Value Ref Range   Glucose-Capillary 182 (H) 65 - 99 mg/dL  Glucose, capillary     Status: Abnormal   Collection Time: 11/01/16  5:22 PM  Result Value Ref Range   Glucose-Capillary 181 (H) 65 - 99 mg/dL  Glucose, capillary     Status: Abnormal   Collection Time: 11/01/16  9:16 PM  Result Value Ref Range   Glucose-Capillary 144 (H) 65 - 99 mg/dL  Glucose, capillary     Status: Abnormal   Collection Time: 11/02/16  8:09 AM  Result Value Ref Range   Glucose-Capillary 170 (H) 65 - 99 mg/dL  Glucose, capillary     Status: Abnormal   Collection Time: 11/02/16 12:43 PM  Result Value Ref Range   Glucose-Capillary 181 (H) 65 - 99 mg/dL  Glucose, capillary     Status: Abnormal   Collection Time: 11/02/16  5:13 PM  Result Value Ref Range   Glucose-Capillary 139 (H) 65 - 99 mg/dL  Glucose, capillary     Status: Abnormal   Collection Time: 11/02/16  9:20 PM  Result Value Ref Range   Glucose-Capillary 184 (H) 65 - 99 mg/dL  Glucose, capillary      Status: Abnormal   Collection Time: 11/03/16  7:42 AM  Result Value Ref Range   Glucose-Capillary 192 (H) 65 - 99 mg/dL  Glucose, capillary     Status: Abnormal   Collection Time: 11/03/16 12:31 PM  Result Value Ref Range   Glucose-Capillary 170 (H) 65 - 99 mg/dL  CULTURE, URINE COMPREHENSIVE     Status: None   Collection Time: 11/11/16 12:26 PM  Result Value Ref Range   Organism ID, Bacteria NO GROWTH   CBC     Status: Abnormal   Collection Time: 11/11/16 12:30 PM  Result Value Ref Range   WBC 11.4 (H) 4.0 - 10.5 K/uL   RBC 4.28 4.22 - 5.81 Mil/uL   Platelets 323.0 150.0 - 400.0 K/uL   Hemoglobin 12.7 (L) 13.0 - 17.0 g/dL   HCT 38.5 (L) 39.0 - 52.0 %   MCV 89.9 78.0 - 100.0 fl   MCHC 32.9 30.0 - 36.0 g/dL   RDW 16.1 (H) 11.5 - 42.6 %  Basic metabolic panel     Status: Abnormal   Collection Time: 11/11/16 12:30 PM  Result Value Ref Range   Sodium 136 135 - 145 mEq/L   Potassium 5.2 (H) 3.5 - 5.1 mEq/L   Chloride 98 96 - 112 mEq/L   CO2 25 19 - 32 mEq/L   Glucose, Bld 122 (H) 70 - 99 mg/dL   BUN 25 (H) 6 - 23 mg/dL   Creatinine, Ser 1.15 0.40 - 1.50 mg/dL   Calcium 10.0 8.4 - 10.5 mg/dL   GFR 63.35 >60.00 mL/min  POCT Urinalysis Dipstick     Status: Abnormal   Collection Time: 11/18/16 11:40 AM  Result Value Ref Range  Color, UA yellow    Clarity, UA cloudy    Glucose, UA negative    Bilirubin, UA negative    Ketones, UA negative    Spec Grav, UA 1.015    Blood, UA negative    pH, UA 6.0    Protein, UA negative    Urobilinogen, UA 0.2    Nitrite, UA negative    Leukocytes, UA large (3+) (A) Negative  Basic metabolic panel     Status: Abnormal   Collection Time: 11/18/16 12:01 PM  Result Value Ref Range   Sodium 137 135 - 145 mEq/L   Potassium 5.4 (H) 3.5 - 5.1 mEq/L   Chloride 99 96 - 112 mEq/L   CO2 27 19 - 32 mEq/L   Glucose, Bld 143 (H) 70 - 99 mg/dL   BUN 14 6 - 23 mg/dL   Creatinine, Ser 0.99 0.40 - 1.50 mg/dL   Calcium 9.6 8.4 - 10.5 mg/dL   GFR  75.31 >60.00 mL/min  CBC     Status: Abnormal   Collection Time: 11/18/16 12:01 PM  Result Value Ref Range   WBC 9.4 4.0 - 10.5 K/uL   RBC 3.88 (L) 4.22 - 5.81 Mil/uL   Platelets 215.0 150.0 - 400.0 K/uL   Hemoglobin 11.7 (L) 13.0 - 17.0 g/dL   HCT 34.5 (L) 39.0 - 52.0 %   MCV 89.0 78.0 - 100.0 fl   MCHC 33.9 30.0 - 36.0 g/dL   RDW 15.9 (H) 11.5 - 15.5 %  Urine Culture     Status: None   Collection Time: 11/18/16 12:34 PM  Result Value Ref Range   Organism ID, Bacteria NO GROWTH   Urine Microalbumin w/creat. ratio     Status: Abnormal   Collection Time: 11/18/16 12:34 PM  Result Value Ref Range   Microalb, Ur 3.9 (H) 0.0 - 1.9 mg/dL   Creatinine,U 35.1 mg/dL   Microalb Creat Ratio 11.1 0.0 - 30.0 mg/g  Basic metabolic panel     Status: Abnormal   Collection Time: 11/21/16  4:31 PM  Result Value Ref Range   Sodium 133 (L) 135 - 145 mEq/L   Potassium 5.1 3.5 - 5.1 mEq/L   Chloride 96 96 - 112 mEq/L   CO2 28 19 - 32 mEq/L   Glucose, Bld 90 70 - 99 mg/dL   BUN 20 6 - 23 mg/dL   Creatinine, Ser 1.05 0.40 - 1.50 mg/dL   Calcium 9.7 8.4 - 10.5 mg/dL   GFR 70.36 >60.00 mL/min  Hepatic function panel     Status: None   Collection Time: 11/21/16  4:31 PM  Result Value Ref Range   Total Bilirubin 0.3 0.2 - 1.2 mg/dL   Bilirubin, Direct 0.1 0.0 - 0.3 mg/dL   Alkaline Phosphatase 41 39 - 117 U/L   AST 18 0 - 37 U/L   ALT 13 0 - 53 U/L   Total Protein 6.9 6.0 - 8.3 g/dL   Albumin 3.7 3.5 - 5.2 g/dL  CBC w/Diff     Status: Abnormal   Collection Time: 11/21/16  4:31 PM  Result Value Ref Range   WBC 9.4 4.0 - 10.5 K/uL   RBC 4.01 (L) 4.22 - 5.81 Mil/uL   Hemoglobin 11.9 (L) 13.0 - 17.0 g/dL   HCT 35.2 (L) 39.0 - 52.0 %   MCV 87.7 78.0 - 100.0 fl   MCHC 33.9 30.0 - 36.0 g/dL   RDW 16.0 (H) 11.5 - 15.5 %   Platelets 196.0  150.0 - 400.0 K/uL   Neutrophils Relative % 49.8 43.0 - 77.0 %   Lymphocytes Relative 30.2 12.0 - 46.0 %   Monocytes Relative 9.2 3.0 - 12.0 %   Eosinophils  Relative 9.8 (H) 0.0 - 5.0 %   Basophils Relative 1.0 0.0 - 3.0 %   Neutro Abs 4.7 1.4 - 7.7 K/uL   Lymphs Abs 2.8 0.7 - 4.0 K/uL   Monocytes Absolute 0.9 0.1 - 1.0 K/uL   Eosinophils Absolute 0.9 (H) 0.0 - 0.7 K/uL   Basophils Absolute 0.1 0.0 - 0.1 K/uL  C-reactive protein     Status: Abnormal   Collection Time: 11/21/16  4:31 PM  Result Value Ref Range   CRP 0.1 (L) 0.5 - 20.0 mg/dL  POCT Urinalysis Dipstick (Automated)     Status: Abnormal   Collection Time: 12/17/16  1:31 PM  Result Value Ref Range   Color, UA yellow    Clarity, UA clear    Glucose, UA negative    Bilirubin, UA negative    Ketones, UA negative    Spec Grav, UA 1.015 1.030 - 1.035   Blood, UA 10    pH, UA 6.0 5.0 - 8.0   Protein, UA 0.15    Urobilinogen, UA negative Negative - 2.0   Nitrite, UA negative    Leukocytes, UA large (3+) (A) Negative  Urine Culture     Status: None   Collection Time: 12/17/16  1:56 PM  Result Value Ref Range   Culture ESCHERICHIA COLI    Colony Count Greater than 100,000 CFU/mL    Organism ID, Bacteria ESCHERICHIA COLI       Susceptibility   Escherichia coli -  (no method available)    AMPICILLIN <=2 Sensitive     AMOX/CLAVULANIC <=2 Sensitive     AMPICILLIN/SULBACTAM <=2 Sensitive     PIP/TAZO <=4 Sensitive     IMIPENEM <=0.25 Sensitive     CEFAZOLIN <=4 Not Reportable     CEFTRIAXONE <=1 Sensitive     CEFTAZIDIME <=1 Sensitive     CEFEPIME <=1 Sensitive     GENTAMICIN <=1 Sensitive     TOBRAMYCIN <=1 Sensitive     CIPROFLOXACIN <=0.25 Sensitive     LEVOFLOXACIN <=0.12 Sensitive     NITROFURANTOIN <=16 Sensitive     TRIMETH/SULFA* <=20 Sensitive      * NR=NOT REPORTABLE,SEE COMMENTORAL therapy:A cefazolin MIC of <32 predicts susceptibility to the oral agents cefaclor,cefdinir,cefpodoxime,cefprozil,cefuroxime,cephalexin,and loracarbef when used for therapy of uncomplicated UTIs due to E.coli,K.pneumomiae,and P.mirabilis. PARENTERAL therapy: A cefazolinMIC of >8  indicates resistance to parenteralcefazolin. An alternate test method must beperformed to confirm susceptibility to parenteralcefazolin.  Urinalysis, Routine w reflex microscopic     Status: None   Collection Time: 12/27/16  9:59 AM  Result Value Ref Range   Color, Urine YELLOW YELLOW   APPearance CLEAR CLEAR   Specific Gravity, Urine 1.009 1.005 - 1.030   pH 7.0 5.0 - 8.0   Glucose, UA NEGATIVE NEGATIVE mg/dL   Hgb urine dipstick NEGATIVE NEGATIVE   Bilirubin Urine NEGATIVE NEGATIVE   Ketones, ur NEGATIVE NEGATIVE mg/dL   Protein, ur NEGATIVE NEGATIVE mg/dL   Nitrite NEGATIVE NEGATIVE   Leukocytes, UA NEGATIVE NEGATIVE    Comment: Microscopic not done on urines with negative protein, blood, leukocytes, nitrite, or glucose < 500 mg/dL.  Urine culture     Status: None   Collection Time: 12/27/16  9:59 AM  Result Value Ref Range   Specimen Description URINE, CLEAN CATCH  Special Requests NONE    Culture      NO GROWTH Performed at [facility], [place] [address], [place], [place] [address]    Report Status 12/28/2016 FINAL   Comprehensive metabolic panel     Status: Abnormal   Collection Time: 12/27/16 10:27 AM  Result Value Ref Range   Sodium 133 (L) 135 - 145 mmol/L   Potassium 4.2 3.5 - 5.1 mmol/L   Chloride 96 (L) 101 - 111 mmol/L   CO2 26 22 - 32 mmol/L   Glucose, Bld 192 (H) 65 - 99 mg/dL   BUN 14 6 - 20 mg/dL   Creatinine, Ser 1.07 0.61 - 1.24 mg/dL   Calcium 9.6 8.9 - 10.3 mg/dL   Total Protein 7.3 6.5 - 8.1 g/dL   Albumin 3.8 3.5 - 5.0 g/dL   AST 25 15 - 41 U/L   ALT 19 17 - 63 U/L   Alkaline Phosphatase 45 38 - 126 U/L   Total Bilirubin 0.7 0.3 - 1.2 mg/dL   GFR calc non Af Amer 59 (L) >60 mL/min   GFR calc Af Amer >60 >60 mL/min    Comment: (NOTE) The eGFR has been calculated using the CKD EPI equation. This calculation has not been validated in all clinical situations. eGFR's persistently <60 mL/min signify possible Chronic Kidney Disease.    Anion  gap 11 5 - 15  CBC with Differential/Platelet     Status: Abnormal   Collection Time: 12/27/16 10:27 AM  Result Value Ref Range   WBC 7.3 4.0 - 10.5 K/uL   RBC 4.31 4.22 - 5.81 MIL/uL   Hemoglobin 12.7 (L) 13.0 - 17.0 g/dL   HCT 36.9 (L) 39.0 - 52.0 %   MCV 85.6 78.0 - 100.0 fL   MCH 29.5 26.0 - 34.0 pg   MCHC 34.4 30.0 - 36.0 g/dL   RDW 14.2 11.5 - 15.5 %   Platelets 191 150 - 400 K/uL   Neutrophils Relative % 67 %   Neutro Abs 4.9 1.7 - 7.7 K/uL   Lymphocytes Relative 10 %   Lymphs Abs 0.8 0.7 - 4.0 K/uL   Monocytes Relative 13 %   Monocytes Absolute 1.0 0.1 - 1.0 K/uL   Eosinophils Relative 9 %   Eosinophils Absolute 0.6 0.0 - 0.7 K/uL   Basophils Relative 1 %   Basophils Absolute 0.1 0.0 - 0.1 K/uL  POCT Urinalysis Dipstick     Status: Abnormal   Collection Time: 01/22/17 11:47 AM  Result Value Ref Range   Color, UA yellow    Clarity, UA cloudy    Glucose, UA negative    Bilirubin, UA negative    Ketones, UA negative    Spec Grav, UA 1.010 1.010 - 1.025   Blood, UA +-    pH, UA 6.5 5.0 - 8.0   Protein, UA negative    Urobilinogen, UA 0.2 0.2 or 1.0 E.U./dL   Nitrite, UA negative    Leukocytes, UA Large (3+) (A) Negative    Assessment/Plan: 1. Cloudy urine 2. UTI symptoms Urine dip with large about of LE. Will send for culture. Giving history and classic presentation for him, will start low-dose empiric Augmenting. Follow-up with Urology as scheduled to discuss preventative ABX. Supportive measures reviewed with patient and family. Alarm signs/symptoms that would prompt need for ER assessment reviewed. Patient and family voice understanding and agreement with plan.   - amoxicillin-clavulanate (AUGMENTIN) 500-125 MG tablet; Take 1 tablet (500 mg total) by  mouth 2 (two) times daily.  Dispense: 14 tablet; Refill: 0 - Urine culture   Leeanne Rio, PA-C

## 2017-01-23 ENCOUNTER — Ambulatory Visit: Payer: Medicare Other | Attending: Physician Assistant | Admitting: Physical Therapy

## 2017-01-23 DIAGNOSIS — M6281 Muscle weakness (generalized): Secondary | ICD-10-CM | POA: Diagnosis not present

## 2017-01-23 DIAGNOSIS — R2681 Unsteadiness on feet: Secondary | ICD-10-CM | POA: Insufficient documentation

## 2017-01-23 DIAGNOSIS — R262 Difficulty in walking, not elsewhere classified: Secondary | ICD-10-CM | POA: Insufficient documentation

## 2017-01-23 DIAGNOSIS — R2689 Other abnormalities of gait and mobility: Secondary | ICD-10-CM | POA: Diagnosis not present

## 2017-01-23 NOTE — Therapy (Signed)
[facility] [address]  [place], [place], [address] Phone: [phone]   Fax:  [phone]  Physical Therapy Treatment  Patient Details  Name: Gerald Holt MRN: [id number] Date of Birth: [birth year] Referring Provider: [name], PA-C  Encounter Date: 01/23/2017      PT End of Session - 01/23/17 1319    Visit Number 3   Number of Visits 16   Date for PT Re-Evaluation 03/21/17   Authorization Type Medicare / AARP   PT Start Time 1319   PT Stop Time 1400   PT Time Calculation (min) 41 min   Activity Tolerance Patient tolerated treatment well   Behavior During Therapy [facility] for tasks assessed/performed      Past Medical History:  Diagnosis Date  . A-fib ([facility])   . Cataracts, bilateral   . Chronic dermatitis   . Diabetes type 2, controlled ([place]) 2000  . History of chicken pox   . Mumps    Adult  . Retinopathy   . Shingles   . Stroke [facility]) Nov 2011  . Undescended testicle, unilateral    Right  . Whooping cough     Past Surgical History:  Procedure Laterality Date  . CARDIAC VALVE REPLACEMENT    . PRE-MALIGNANT / BENIGN SKIN LESION EXCISION    . TESTICLE SURGERY     Right, undescended  . TOOTH EXTRACTION      There were no vitals filed for this visit.      Subjective Assessment - 01/23/17 1322    Pertinent History Mutiple hospitalizations overpast year for urinary sepsis & PNA; recurrent UTI's secondary renal insufficieny requiring catheterization by wife; DM; A-fib: h/o CVA 2011   Patient Stated Goals "To improve my balance & make my hip hurt less"   Currently in Pain? Yes   Pain Score 2    Pain Location Hip   Pain Orientation Right   Pain Descriptors / Indicators Aching   Pain Type Acute pain;Chronic pain                         [facility] Adult PT Treatment/Exercise - 01/23/17 1319      Knee/Hip Exercises: Stretches   Passive Hamstring Stretch Both;30 seconds;1 rep    Passive Hamstring Stretch Limitations manual   Other Knee/Hip Stretches B SKTC stretch x30"     Knee/Hip Exercises: Aerobic   Nustep lvl 4 x 6'             Balance Exercises - 01/23/17 1319      OTAGO PROGRAM   Head Movements Standing;5 reps   Neck Movements Standing;5 reps   Back Extension Standing;5 reps  back of hips on counter   Trunk Movements Standing;5 reps   Ankle Movements Sitting;10 reps   Knee Extensor 10 reps   Knee Flexor 5 reps   Hip ABductor 5 reps   Ankle Plantorflexors --  10 reps, support   Ankle Dorsiflexors --  10 reps, support   Knee Bends 10 reps, support             PT Short Term Goals - 01/23/17 1325      PT SHORT TERM GOAL #1   Title Complete balance testing via Merrilee Jansky +/- DGI/FGA as indicated by 01/06/17   Status Achieved     PT SHORT TERM GOAL #2   Title Pt & caregiver will be independent with initial HEP by 02/07/17   Status  On-going           PT Long Term Goals - 01/23/17 1325      PT LONG TERM GOAL #1   Title Pt & caregiver will be independent with advanced HEP as indicated by 03/21/17   Status On-going     PT LONG TERM GOAL #2   Title Pt will improve bilateral LE strength to 4/5 or greater by 03/21/17   Status On-going     PT LONG TERM GOAL #3   Title Pt will improve gait speed to 3.0 ft/sec or greater for improved safety with community ambulation by 03/21/17   Status On-going     PT LONG TERM GOAL #4   Title Pt will demonstrate improvement on the Berg Balance Scale to 44/56 or greater to reduce risk for falls by 03/21/17   Status On-going     PT LONG TERM GOAL #5   Title Pt will complete the normal, manual & cogitive TUGs to within 2 sec difference in time to reduce risk for falls by 03/21/17   Status On-going     PT LONG TERM GOAL #6   Title Pt will demonstrate improvement on the DGI to 15/24 or greater to reduce risk for falls during gait by 03/21/17   Status On-going               Plan - 01/23/17 1325     Clinical Impression Statement Pt demonstrating limited proximal flexibility but has poor tolerance for stretching, therefore deferred at this time. Introduced flexibility/AROM and strengthening portions of [place] fall prevention program with pt and caregiver with instructions to attempt every other day initially, gradually increasing reps to 10, then 20 reps.    Rehab Potential Good   Clinical Impairments Affecting Rehab Potential Mutiple hospitalizations overpast year for urinary sepsis & PNA; recurrent UTI's secondary renal insufficieny requiring catheterization by wife; DM; A-fib: h/o CVA 2011   PT Treatment/Interventions Patient/family education;ADLs/Self Care Home Management;Therapeutic exercise;Therapeutic activities;Functional mobility training;Gait training;Neuromuscular re-education;Balance training;Manual techniques   PT Next Visit Plan review flexibility & strengthening portions of [place] Fall Prevention Program & add balance portion   Consulted and Agree with Plan of Care Patient;Family member/caregiver   Family Member Consulted caregiver - Sharyn Lull      Patient will benefit from skilled therapeutic intervention in order to improve the following deficits and impairments:  Decreased balance, Abnormal gait, Difficulty walking, Pain, Decreased strength, Impaired flexibility, Decreased endurance, Decreased activity tolerance, Decreased knowledge of use of DME  Visit Diagnosis: Unsteadiness on feet  Other abnormalities of gait and mobility  Difficulty in walking, not elsewhere classified  Muscle weakness (generalized)     Problem List Patient Active Problem List   Diagnosis Date Noted  . Intermittent self-catheterization of bladder 11/22/2016  . Enlarged prostate 11/22/2016  . PNA (pneumonia) 10/31/2016  . HCAP (healthcare-associated pneumonia) 10/31/2016  . Urinary retention 10/31/2016  . Diabetes mellitus with complication ([place])   . Lobar pneumonia ([place])   . Renal  insufficiency 10/25/2016  . Encounter for therapeutic drug monitoring 10/20/2016  . Hx: UTI (urinary tract infection) 10/20/2016  . Hypothyroidism 09/15/2016  . Gastroesophageal reflux disease without esophagitis 07/09/2015  . Falls 06/22/2015  . Diabetes mellitus type II, uncontrolled ([place]) 05/29/2015  . Paroxysmal atrial fibrillation ([place]) 05/29/2015  . Hyperlipidemia 05/29/2015  . Hx of completed stroke 05/29/2015    Percival Spanish, PT, MPT 01/23/2017, 4:09 PM  [facility] [place]  [place], [place], [address] Phone: [phone]   Fax:  [phone]  Name: Gerald Holt MRN: [id number] Date of Birth: 02/21/1924

## 2017-01-24 DIAGNOSIS — N400 Enlarged prostate without lower urinary tract symptoms: Secondary | ICD-10-CM | POA: Diagnosis not present

## 2017-01-24 DIAGNOSIS — R339 Retention of urine, unspecified: Secondary | ICD-10-CM | POA: Diagnosis not present

## 2017-01-24 LAB — URINE CULTURE: Organism ID, Bacteria: NO GROWTH

## 2017-01-27 ENCOUNTER — Ambulatory Visit: Payer: Medicare Other | Admitting: Physical Therapy

## 2017-01-27 DIAGNOSIS — M6281 Muscle weakness (generalized): Secondary | ICD-10-CM

## 2017-01-27 DIAGNOSIS — R2689 Other abnormalities of gait and mobility: Secondary | ICD-10-CM | POA: Diagnosis not present

## 2017-01-27 DIAGNOSIS — R262 Difficulty in walking, not elsewhere classified: Secondary | ICD-10-CM

## 2017-01-27 DIAGNOSIS — R2681 Unsteadiness on feet: Secondary | ICD-10-CM

## 2017-01-27 NOTE — Therapy (Signed)
[facility] [address]  [place], [place], [address] Phone: [phone]   Fax:  [phone]  Physical Therapy Treatment  Patient Details  Name: Gerald Holt MRN: [id number] Date of Birth: 08-25-1924 Referring Provider: [name], PA-C  Encounter Date: 01/27/2017      PT End of Session - 01/27/17 1400    Visit Number 4   Number of Visits 16   Date for PT Re-Evaluation 03/21/17   Authorization Type Medicare / AARP   PT Start Time 1400   PT Stop Time 1455   PT Time Calculation (min) 55 min   Activity Tolerance Patient tolerated treatment well   Behavior During Therapy [facility] for tasks assessed/performed      Past Medical History:  Diagnosis Date  . A-fib ([place])   . Cataracts, bilateral   . Chronic dermatitis   . Diabetes type 2, controlled ([place]) 2000  . History of chicken pox   . Mumps    Adult  . Retinopathy   . Shingles   . Stroke [facility]) Nov 2011  . Undescended testicle, unilateral    Right  . Whooping cough     Past Surgical History:  Procedure Laterality Date  . CARDIAC VALVE REPLACEMENT    . PRE-MALIGNANT / BENIGN SKIN LESION EXCISION    . TESTICLE SURGERY     Right, undescended  . TOOTH EXTRACTION      There were no vitals filed for this visit.      Subjective Assessment - 01/27/17 1417    Subjective Pt & caregive report completing initial portion of [place] 1x since last visit and deny any concerns with exercises/activities.   Pertinent History Mutiple hospitalizations overpast year for urinary sepsis & PNA; recurrent UTI's secondary renal insufficieny requiring catheterization by wife; DM; A-fib: h/o CVA 2011   Patient Stated Goals "To improve my balance & make my hip hurt less"   Currently in Pain? No/denies                         [facility] Adult PT Treatment/Exercise - 01/27/17 1400      Knee/Hip Exercises: Stretches   Passive Hamstring Stretch Both;30 seconds;2  reps   Passive Hamstring Stretch Limitations manual passive stretch by caregiver   Piriformis Stretch Both;30 seconds;1 rep   Piriformis Stretch Limitations KTOS   Other Knee/Hip Stretches B SKTC stretch x30"     Knee/Hip Exercises: Aerobic   Nustep lvl 4 x 6' (LE only)             Balance Exercises - 01/27/17 1400      OTAGO PROGRAM   Backwards Walking Support   Walking and Turning Around No assistive device   Sideways Walking Assistive device   Tandem Stance 10 seconds, support   Tandem Walk Support   One Leg Stand 10 seconds, support   Heel Walking Support   Toe Walk Support   Heel Toe Walking Backward --  Level C = Support   Sit to Stand 10 reps, no support   Stair Walking --  defer           PT Education - 01/27/17 1455    Education Details Completed training in balance portion of [place] program + HEP stretches   Person(s) Educated Patient;Caregiver(s)   Methods Explanation;Demonstration;Handout;Verbal cues   Comprehension Verbalized understanding;Returned demonstration;Verbal cues required;Need further instruction          PT Short Term  Goals - 01/23/17 1325      PT SHORT TERM GOAL #1   Title Complete balance testing via Merrilee Jansky +/- DGI/FGA as indicated by 01/06/17   Status Achieved     PT SHORT TERM GOAL #2   Title Pt & caregiver will be independent with initial HEP by 02/07/17   Status On-going           PT Long Term Goals - 01/23/17 1325      PT LONG TERM GOAL #1   Title Pt & caregiver will be independent with advanced HEP as indicated by 03/21/17   Status On-going     PT LONG TERM GOAL #2   Title Pt will improve bilateral LE strength to 4/5 or greater by 03/21/17   Status On-going     PT LONG TERM GOAL #3   Title Pt will improve gait speed to 3.0 ft/sec or greater for improved safety with community ambulation by 03/21/17   Status On-going     PT LONG TERM GOAL #4   Title Pt will demonstrate improvement on the Berg Balance Scale to 44/56  or greater to reduce risk for falls by 03/21/17   Status On-going     PT LONG TERM GOAL #5   Title Pt will complete the normal, manual & cogitive TUGs to within 2 sec difference in time to reduce risk for falls by 03/21/17   Status On-going     PT LONG TERM GOAL #6   Title Pt will demonstrate improvement on the DGI to 15/24 or greater to reduce risk for falls during gait by 03/21/17   Status On-going               Plan - 01/27/17 1500    Clinical Impression Statement Completed instruction in balance portion of [place] Fall Prevention Program with pt able to complete all activities at supported level, except deferred stairs. Pt & caregiver deny any concerns or need for review of initial portiion of [place] program. Pt and caregiver instructed to continue to perform 2 portions of [place] program on alternate days, skipping therapy days. Readdressed LE stretches with pt demonstrating better tolerance today after cueing to excessive pressure and remain w/in pain-free ROM. Pt unable perform HS stretch with good static control using strap, therefore caregiver, Selinda Eon, instructed in passive HS stretch for HEP.   Rehab Potential Good   Clinical Impairments Affecting Rehab Potential Mutiple hospitalizations overpast year for urinary sepsis & PNA; recurrent UTI's secondary renal insufficieny requiring catheterization by wife; DM; A-fib: h/o CVA 2011   PT Treatment/Interventions Patient/family education;ADLs/Self Care Home Management;Therapeutic exercise;Therapeutic activities;Functional mobility training;Gait training;Neuromuscular re-education;Balance training;Manual techniques   PT Next Visit Plan review flexibility & strengthening portions of [place] Fall Prevention Program & add balance portion   Consulted and Agree with Plan of Care Patient;Family member/caregiver   Family Member Consulted caregiver - Sharyn Lull      Patient will benefit from skilled therapeutic intervention in order to improve the  following deficits and impairments:  Decreased balance, Abnormal gait, Difficulty walking, Pain, Decreased strength, Impaired flexibility, Decreased endurance, Decreased activity tolerance, Decreased knowledge of use of DME  Visit Diagnosis: Unsteadiness on feet  Other abnormalities of gait and mobility  Difficulty in walking, not elsewhere classified  Muscle weakness (generalized)     Problem List Patient Active Problem List   Diagnosis Date Noted  . Intermittent self-catheterization of bladder 11/22/2016  . Enlarged prostate 11/22/2016  . PNA (pneumonia) 10/31/2016  . HCAP (healthcare-associated pneumonia) 10/31/2016  .  Urinary retention 10/31/2016  . Diabetes mellitus with complication ([place])   . Lobar pneumonia ([place])   . Renal insufficiency 10/25/2016  . Encounter for therapeutic drug monitoring 10/20/2016  . Hx: UTI (urinary tract infection) 10/20/2016  . Hypothyroidism 09/15/2016  . Gastroesophageal reflux disease without esophagitis 07/09/2015  . Falls 06/22/2015  . Diabetes mellitus type II, uncontrolled ([place]) 05/29/2015  . Paroxysmal atrial fibrillation ([place]) 05/29/2015  . Hyperlipidemia 05/29/2015  . Hx of completed stroke 05/29/2015    Percival Spanish, PT, MPT 01/27/2017, 3:09 PM  [facility] [address]  [place], [place], [address] Phone: [phone]   Fax:  [phone]  Name: Gerald Holt MRN: [id number] Date of Birth: [birth year]

## 2017-01-29 DIAGNOSIS — N390 Urinary tract infection, site not specified: Secondary | ICD-10-CM | POA: Diagnosis not present

## 2017-01-30 ENCOUNTER — Ambulatory Visit: Payer: Medicare Other | Admitting: Physical Therapy

## 2017-01-30 DIAGNOSIS — R2689 Other abnormalities of gait and mobility: Secondary | ICD-10-CM

## 2017-01-30 DIAGNOSIS — R262 Difficulty in walking, not elsewhere classified: Secondary | ICD-10-CM

## 2017-01-30 DIAGNOSIS — M6281 Muscle weakness (generalized): Secondary | ICD-10-CM | POA: Diagnosis not present

## 2017-01-30 DIAGNOSIS — R2681 Unsteadiness on feet: Secondary | ICD-10-CM

## 2017-01-30 NOTE — Therapy (Signed)
[facility] [address]  [place], [place], [address] Phone: [phone]   Fax:  [phone]  Physical Therapy Treatment  Patient Details  Name: Gerald Holt MRN: [id number] Date of Birth: 11-29-1923 Referring Provider: [name], PA-C  Encounter Date: 01/30/2017      PT End of Session - 01/30/17 1015    Visit Number 5   Number of Visits 16   Date for PT Re-Evaluation 03/21/17   Authorization Type Medicare / AARP   PT Start Time 6962   PT Stop Time 1100   PT Time Calculation (min) 45 min   Activity Tolerance Patient tolerated treatment well   Behavior During Therapy [facility] for tasks assessed/performed      Past Medical History:  Diagnosis Date  . A-fib ([place])   . Cataracts, bilateral   . Chronic dermatitis   . Diabetes type 2, controlled ([place]) 2000  . History of chicken pox   . Mumps    Adult  . Retinopathy   . Shingles   . Stroke [facility]) Nov 2011  . Undescended testicle, unilateral    Right  . Whooping cough     Past Surgical History:  Procedure Laterality Date  . CARDIAC VALVE REPLACEMENT    . PRE-MALIGNANT / BENIGN SKIN LESION EXCISION    . TESTICLE SURGERY     Right, undescended  . TOOTH EXTRACTION      There were no vitals filed for this visit.      Subjective Assessment - 01/30/17 1019    Subjective Pt's caregiver, Selinda Eon, requesting clarification of HEP stretches. Reports doing 1/2 of [place] program per day, but states pt did not feel up to doing anything yesterday.   Patient is accompained by: --  caregiver - Selinda Eon   Pertinent History Mutiple hospitalizations overpast year for urinary sepsis & PNA; recurrent UTI's secondary renal insufficieny requiring catheterization by wife; DM; A-fib: h/o CVA 2011   Patient Stated Goals "To improve my balance & make my hip hurt less"   Currently in Pain? No/denies                         [facility] Adult PT Treatment/Exercise  - 01/30/17 1015      Knee/Hip Exercises: Stretches   Piriformis Stretch Both;30 seconds;1 rep   Piriformis Stretch Limitations KTOS     Knee/Hip Exercises: Aerobic   Nustep lvl 5 x 6' (LE only)     Knee/Hip Exercises: Standing   Forward Lunges Both;10 reps;2 seconds   Forward Lunges Limitations hand on back of chair for support + CGA/SBA of PT - pt buckled on last rep requiring PT assistance to right himself     Knee/Hip Exercises: Seated   Hamstring Curl Both;10 reps   Hamstring Limitations green TB     Knee/Hip Exercises: Supine   Bridges with Clamshell Both;10 reps  green TB - alt LE hip ABD/ER   Straight Leg Raises Both;10 reps     Knee/Hip Exercises: Sidelying   Clams B clam with green TB 10x3"             Balance Exercises - 01/30/17 1015      Balance Exercises: Standing   Standing, One Foot on a Step Foam/compliant surface;8 inch  10 reps - alt foot tap to 8" step   Step Ups Forward;2 inch;Intermittent UE support  Airex foam step-over     OTAGO PROGRAM   Trunk Movements  Standing;5 reps             PT Short Term Goals - 01/30/17 1021      PT SHORT TERM GOAL #1   Title Complete balance testing via Merrilee Jansky +/- DGI/FGA as indicated by 01/06/17   Status Achieved     PT SHORT TERM GOAL #2   Title Pt & caregiver will be independent with initial HEP by 02/07/17   Status On-going           PT Long Term Goals - 01/23/17 1325      PT LONG TERM GOAL #1   Title Pt & caregiver will be independent with advanced HEP as indicated by 03/21/17   Status On-going     PT LONG TERM GOAL #2   Title Pt will improve bilateral LE strength to 4/5 or greater by 03/21/17   Status On-going     PT LONG TERM GOAL #3   Title Pt will improve gait speed to 3.0 ft/sec or greater for improved safety with community ambulation by 03/21/17   Status On-going     PT LONG TERM GOAL #4   Title Pt will demonstrate improvement on the Berg Balance Scale to 44/56 or greater to reduce  risk for falls by 03/21/17   Status On-going     PT LONG TERM GOAL #5   Title Pt will complete the normal, manual & cogitive TUGs to within 2 sec difference in time to reduce risk for falls by 03/21/17   Status On-going     PT LONG TERM GOAL #6   Title Pt will demonstrate improvement on the DGI to 15/24 or greater to reduce risk for falls during gait by 03/21/17   Status On-going               Plan - 01/30/17 1021    Clinical Impression Statement Pt's caregiver requesting clarification of KTOS piriformis stretch and ideas for cueing to promote improved trunk rotation with [place] exercise, therefore reviewed these exercises with pt with pt able to perform them appropriately. Treatment focus today mixed core and functiona;l strengthening along with some balance activities. Pt requiring close guarding during standing exercises and balance activities, with PT assist on 3 occasions due to buckling or significant LOB.   Rehab Potential Good   Clinical Impairments Affecting Rehab Potential Mutiple hospitalizations overpast year for urinary sepsis & PNA; recurrent UTI's secondary renal insufficieny requiring catheterization by wife; DM; A-fib: h/o CVA 2011   PT Treatment/Interventions Patient/family education;ADLs/Self Care Home Management;Therapeutic exercise;Therapeutic activities;Functional mobility training;Gait training;Neuromuscular re-education;Balance training;Manual techniques   PT Next Visit Plan review & progress [facility] as indicated; functional strengthening, balance and dynamic gait activities   Consulted and Agree with Plan of Care Patient;Family member/caregiver   Family Member Consulted caregiver - Sharyn Lull      Patient will benefit from skilled therapeutic intervention in order to improve the following deficits and impairments:  Decreased balance, Abnormal gait, Difficulty walking, Pain, Decreased strength, Impaired flexibility, Decreased endurance, Decreased  activity tolerance, Decreased knowledge of use of DME  Visit Diagnosis: Unsteadiness on feet  Other abnormalities of gait and mobility  Difficulty in walking, not elsewhere classified  Muscle weakness (generalized)     Problem List Patient Active Problem List   Diagnosis Date Noted  . Intermittent self-catheterization of bladder 11/22/2016  . Enlarged prostate 11/22/2016  . PNA (pneumonia) 10/31/2016  . HCAP (healthcare-associated pneumonia) 10/31/2016  . Urinary retention 10/31/2016  . Diabetes mellitus with complication (  [place])   . Lobar pneumonia ([place])   . Renal insufficiency 10/25/2016  . Encounter for therapeutic drug monitoring 10/20/2016  . Hx: UTI (urinary tract infection) 10/20/2016  . Hypothyroidism 09/15/2016  . Gastroesophageal reflux disease without esophagitis 07/09/2015  . Falls 06/22/2015  . Diabetes mellitus type II, uncontrolled ([place]) 05/29/2015  . Paroxysmal atrial fibrillation ([place]) 05/29/2015  . Hyperlipidemia 05/29/2015  . Hx of completed stroke 05/29/2015    Percival Spanish, PT, MPT 01/30/2017, 11:32 AM  [facility] [address]  [place], [place], [address] Phone: [phone]   Fax:  [phone]  Name: Gerald Holt MRN: [id number] Date of Birth: October 31, 1923

## 2017-02-03 ENCOUNTER — Encounter: Payer: Self-pay | Admitting: Physician Assistant

## 2017-02-03 ENCOUNTER — Telehealth: Payer: Self-pay | Admitting: Physician Assistant

## 2017-02-03 NOTE — Telephone Encounter (Signed)
Wife states that she believes that pt had a uti, she states that last week at infectious disease he did not have one, however urine has became cloudy and can see things in it. Wife if not sure if pt needs to come in or if a sample could be brought in and would like a call back

## 2017-02-04 ENCOUNTER — Ambulatory Visit: Payer: Medicare Other | Admitting: Physical Therapy

## 2017-02-04 DIAGNOSIS — R2681 Unsteadiness on feet: Secondary | ICD-10-CM | POA: Diagnosis not present

## 2017-02-04 DIAGNOSIS — M6281 Muscle weakness (generalized): Secondary | ICD-10-CM | POA: Diagnosis not present

## 2017-02-04 DIAGNOSIS — R262 Difficulty in walking, not elsewhere classified: Secondary | ICD-10-CM | POA: Diagnosis not present

## 2017-02-04 DIAGNOSIS — R2689 Other abnormalities of gait and mobility: Secondary | ICD-10-CM

## 2017-02-04 NOTE — Telephone Encounter (Signed)
Spoke with patient and husband via MyChart as she sent multiple messages. See messaged for further commentary.

## 2017-02-04 NOTE — Therapy (Signed)
[facility] [address]  [place], [place], [address] Phone: [phone]   Fax:  [phone]  Physical Therapy Treatment  Patient Details  Name: Gerald Holt MRN: [id number] Date of Birth: 14-Feb-1924 Referring Provider: [name], PA-C  Encounter Date: 02/04/2017      PT End of Session - 02/04/17 1052    Visit Number 6   Number of Visits 16   Date for PT Re-Evaluation 03/21/17   Authorization Type Medicare / AARP   PT Start Time 4174   PT Stop Time 1136   PT Time Calculation (min) 44 min   Activity Tolerance Patient tolerated treatment well   Behavior During Therapy [facility] for tasks assessed/performed      Past Medical History:  Diagnosis Date  . A-fib ([place])   . Cataracts, bilateral   . Chronic dermatitis   . Diabetes type 2, controlled ([place]) 2000  . History of chicken pox   . Mumps    Adult  . Retinopathy   . Shingles   . Stroke [facility]) Nov 2011  . Undescended testicle, unilateral    Right  . Whooping cough     Past Surgical History:  Procedure Laterality Date  . CARDIAC VALVE REPLACEMENT    . PRE-MALIGNANT / BENIGN SKIN LESION EXCISION    . TESTICLE SURGERY     Right, undescended  . TOOTH EXTRACTION      There were no vitals filed for this visit.      Subjective Assessment - 02/04/17 1058    Subjective Pt w/o new c/o or concerns.   Patient is accompained by: --  caregiver - Gerald Holt   Pertinent History Mutiple hospitalizations overpast year for urinary sepsis & PNA; recurrent UTI's secondary renal insufficieny requiring catheterization by wife; DM; A-fib: h/o CVA 2011   Patient Stated Goals "To improve my balance & make my hip hurt less"   Currently in Pain? No/denies                         [facility] Adult PT Treatment/Exercise - 02/04/17 1052      Self-Care   Self-Care Other Self-Care Comments   Other Self-Care Comments  Ecducated pt & caregiver on carryover of  PWR! Moves into daily functional tasks     Knee/Hip Exercises: Aerobic   Nustep lvl 5 x 6' (LE only)           PWR [facility]) - 02/04/17 1052    PWR! exercises Moves in sitting;Moves in standing;Functional moves   PWR! Up x10   PWR! Rock x10   PWR! Twist x10   PWR Step x10   Comments modified at back of chair   PWR! Sit to Stand x10   PWR! Up x10   PWR! Rock x10   PWR! Twist x10   PWR! Step x10             PT Education - 02/04/17 1136    Education provided Yes   Education Details seated & standing PWR! Moves   Person(s) Educated Patient;Caregiver(s)   Methods Explanation;Demonstration;Handout   Comprehension Verbalized understanding;Returned demonstration;Need further instruction          PT Short Term Goals - 02/04/17 1059      PT SHORT TERM GOAL #1   Title Complete balance testing via Merrilee Jansky +/- DGI/FGA as indicated by 01/06/17   Status Achieved     PT SHORT TERM GOAL #2  Title Pt & caregiver will be independent with initial HEP by 02/07/17   Status Achieved           PT Long Term Goals - 01/23/17 1325      PT LONG TERM GOAL #1   Title Pt & caregiver will be independent with advanced HEP as indicated by 03/21/17   Status On-going     PT LONG TERM GOAL #2   Title Pt will improve bilateral LE strength to 4/5 or greater by 03/21/17   Status On-going     PT LONG TERM GOAL #3   Title Pt will improve gait speed to 3.0 ft/sec or greater for improved safety with community ambulation by 03/21/17   Status On-going     PT LONG TERM GOAL #4   Title Pt will demonstrate improvement on the Berg Balance Scale to 44/56 or greater to reduce risk for falls by 03/21/17   Status On-going     PT LONG TERM GOAL #5   Title Pt will complete the normal, manual & cogitive TUGs to within 2 sec difference in time to reduce risk for falls by 03/21/17   Status On-going     PT LONG TERM GOAL #6   Title Pt will demonstrate improvement on the DGI to 15/24 or greater to reduce risk  for falls during gait by 03/21/17   Status On-going               Plan - 02/04/17 1059    Clinical Impression Statement Introduced PWR! Moves in sitting and standing with pt and caregiver to promote bigger amplitude movement patterns to facilitate improved posture, weight shift, trunk rotation and stepping patterns. Provided education in carryover to functional tasks including sit to stand transitions (PWR! Up) & car transfers (PWR! step). Will plan to target PWR! walk & step patterns for improved gait pattern at next visit.   Rehab Potential Good   Clinical Impairments Affecting Rehab Potential Mutiple hospitalizations overpast year for urinary sepsis & PNA; recurrent UTI's secondary renal insufficieny requiring catheterization by wife; DM; A-fib: h/o CVA 2011   PT Treatment/Interventions Patient/family education;ADLs/Self Care Home Management;Therapeutic exercise;Therapeutic activities;Functional mobility training;Gait training;Neuromuscular re-education;Balance training;Manual techniques   PT Next Visit Plan review seated & standing PWR! Moves & introduce PWR! Walk; review & progress [employer] as indicated; functional strengthening, balance and dynamic gait activities   Consulted and Agree with Plan of Care Patient;Family member/caregiver   Family Member Consulted caregiver - Gerald Holt      Patient will benefit from skilled therapeutic intervention in order to improve the following deficits and impairments:  Decreased balance, Abnormal gait, Difficulty walking, Pain, Decreased strength, Impaired flexibility, Decreased endurance, Decreased activity tolerance, Decreased knowledge of use of DME  Visit Diagnosis: Unsteadiness on feet  Other abnormalities of gait and mobility  Difficulty in walking, not elsewhere classified  Muscle weakness (generalized)     Problem List Patient Active Problem List   Diagnosis Date Noted  . Intermittent self-catheterization of  bladder 11/22/2016  . Enlarged prostate 11/22/2016  . PNA (pneumonia) 10/31/2016  . HCAP (healthcare-associated pneumonia) 10/31/2016  . Urinary retention 10/31/2016  . Diabetes mellitus with complication ([place])   . Lobar pneumonia ([place])   . Renal insufficiency 10/25/2016  . Encounter for therapeutic drug monitoring 10/20/2016  . Hx: UTI (urinary tract infection) 10/20/2016  . Hypothyroidism 09/15/2016  . Gastroesophageal reflux disease without esophagitis 07/09/2015  . Falls 06/22/2015  . Diabetes mellitus type II, uncontrolled ([place]) 05/29/2015  .  Paroxysmal atrial fibrillation ([place]) 05/29/2015  . Hyperlipidemia 05/29/2015  . Hx of completed stroke 05/29/2015    Percival Spanish, PT, MPT 02/04/2017, 12:12 PM  [facility] [address]  [place], [place], [address] Phone: [phone]   Fax:  [phone]  Name: Gerald Holt MRN: [id number] Date of Birth: [birth year]

## 2017-02-06 ENCOUNTER — Ambulatory Visit: Payer: Medicare Other | Admitting: Physical Therapy

## 2017-02-06 DIAGNOSIS — M6281 Muscle weakness (generalized): Secondary | ICD-10-CM

## 2017-02-06 DIAGNOSIS — R2689 Other abnormalities of gait and mobility: Secondary | ICD-10-CM | POA: Diagnosis not present

## 2017-02-06 DIAGNOSIS — R262 Difficulty in walking, not elsewhere classified: Secondary | ICD-10-CM

## 2017-02-06 DIAGNOSIS — R2681 Unsteadiness on feet: Secondary | ICD-10-CM | POA: Diagnosis not present

## 2017-02-06 NOTE — Therapy (Signed)
[facility] [address]  [place], [place], [address] Phone: [phone]   Fax:  [phone]  Physical Therapy Treatment  Patient Details  Name: Gerald [place] MRN: [id number] Date of Birth: 10-Jan-1924 Referring Provider: [name], PA-C  Encounter Date: 02/06/2017      PT End of Session - 02/06/17 1004    Visit Number 7   Number of Visits 16   Date for PT Re-Evaluation 03/21/17   Authorization Type Medicare / AARP   PT Start Time 1004   PT Stop Time 1051   PT Time Calculation (min) 47 min   Activity Tolerance Patient tolerated treatment well   Behavior During Therapy [facility] for tasks assessed/performed      Past Medical History:  Diagnosis Date  . A-fib ([place])   . Cataracts, bilateral   . Chronic dermatitis   . Diabetes type 2, controlled ([place]) 2000  . History of chicken pox   . Mumps    Adult  . Retinopathy   . Shingles   . Stroke [facility]) Nov 2011  . Undescended testicle, unilateral    Right  . Whooping cough     Past Surgical History:  Procedure Laterality Date  . CARDIAC VALVE REPLACEMENT    . PRE-MALIGNANT / BENIGN SKIN LESION EXCISION    . TESTICLE SURGERY     Right, undescended  . TOOTH EXTRACTION      There were no vitals filed for this visit.      Subjective Assessment - 02/06/17 1006    Subjective Pt and caregiver report that pt is now able to complete the full [place] program on a single day, rather than having to complete half on alternate days.   Patient is accompained by: --  caregiver - Selinda Eon   Pertinent History Mutiple hospitalizations overpast year for urinary sepsis & PNA; recurrent UTI's secondary renal insufficieny requiring catheterization by wife; DM; A-fib: h/o CVA 2011   Patient Stated Goals "To improve my balance & make my hip hurt less"   Currently in Pain? No/denies                         [facility] Adult PT Treatment/Exercise - 02/06/17 1004      Ambulation/Gait   Ambulation/Gait Assistance 4: Min guard;4: Min assist   Ambulation/Gait Assistance Details cues for increased foot clearance with good heel strike, longer step/stride length and increased reciprocal arm swing, incorporating PWR! step/walk to promote normialized gait pattern.   Ambulation Distance (Feet) 500 Feet  200' on tile + 300' on carpet   Assistive device None   Gait Pattern Step-through pattern;Decreased hip/knee flexion - right;Decreased hip/knee flexion - left;Decreased trunk rotation;Poor foot clearance - left;Poor foot clearance - right;Decreased arm swing - right;Decreased arm swing - left   Ambulation Surface Level;Indoor   Gait Comments pt with LOB x 2 on carpeted surface requiring min PT assistance to prevemt fall     Knee/Hip Exercises: Aerobic   Nustep lvl 6 x 6' (LE only)           PWR [facility]) - 02/06/17 1004    PWR! exercises Moves in sitting;Moves in standing;Functional moves   PWR! Up x10   PWR! Rock [employer]! Twist x20   PWR Step x20   Comments modified at back of chair   PWR! Step Through Forward/Back x10 each fwd & back   single UE support on back of chair  PWR! Up x10   PWR! Rock [employer]! Twist x20  using boom-whackers   PWR! Step x20               PT Short Term Goals - 02/04/17 1059      PT SHORT TERM GOAL #1   Title Complete balance testing via Merrilee Jansky +/- DGI/FGA as indicated by 01/06/17   Status Achieved     PT SHORT TERM GOAL #2   Title Pt & caregiver will be independent with initial HEP by 02/07/17   Status Achieved           PT Long Term Goals - 01/23/17 1325      PT LONG TERM GOAL #1   Title Pt & caregiver will be independent with advanced HEP as indicated by 03/21/17   Status On-going     PT LONG TERM GOAL #2   Title Pt will improve bilateral LE strength to 4/5 or greater by 03/21/17   Status On-going     PT LONG TERM GOAL #3   Title Pt will improve gait speed to 3.0 ft/sec or greater for improved  safety with community ambulation by 03/21/17   Status On-going     PT LONG TERM GOAL #4   Title Pt will demonstrate improvement on the Berg Balance Scale to 44/56 or greater to reduce risk for falls by 03/21/17   Status On-going     PT LONG TERM GOAL #5   Title Pt will complete the normal, manual & cogitive TUGs to within 2 sec difference in time to reduce risk for falls by 03/21/17   Status On-going     PT LONG TERM GOAL #6   Title Pt will demonstrate improvement on the DGI to 15/24 or greater to reduce risk for falls during gait by 03/21/17   Status On-going               Plan - 02/06/17 1009    Clinical Impression Statement Review PWR! Moves in sitting and standing with pt continuing to struggle to some degree with coordination of movement patterns but able to better follow PT direction. Introduced [employer]! step & walk patterns and promoted carryover into gait to normalize gait pattern for improved stability. Pt again demonstrating some difficulty coordinating movement patterns resulting in 2 mild LOB requiring min assist of PT to correct.   Rehab Potential Good   Clinical Impairments Affecting Rehab Potential Mutiple hospitalizations overpast year for urinary sepsis & PNA; recurrent UTI's secondary renal insufficieny requiring catheterization by wife; DM; A-fib: h/o CVA 2011   PT Treatment/Interventions Patient/family education;ADLs/Self Care Home Management;Therapeutic exercise;Therapeutic activities;Functional mobility training;Gait training;Neuromuscular re-education;Balance training;Manual techniques   PT Next Visit Plan review & porgress seated & standing PWR! Moves & PWR! Walk; review & progress [employer] as indicated; functional strengthening, balance and dynamic gait activities   Consulted and Agree with Plan of Care Patient;Family member/caregiver   Family Member Consulted caregiver - Sharyn Lull      Patient will benefit from skilled therapeutic intervention  in order to improve the following deficits and impairments:  Decreased balance, Abnormal gait, Difficulty walking, Pain, Decreased strength, Impaired flexibility, Decreased endurance, Decreased activity tolerance, Decreased knowledge of use of DME  Visit Diagnosis: Unsteadiness on feet  Other abnormalities of gait and mobility  Difficulty in walking, not elsewhere classified  Muscle weakness (generalized)     Problem List Patient Active Problem List   Diagnosis Date Noted  . Intermittent self-catheterization of  bladder 11/22/2016  . Enlarged prostate 11/22/2016  . PNA (pneumonia) 10/31/2016  . HCAP (healthcare-associated pneumonia) 10/31/2016  . Urinary retention 10/31/2016  . Diabetes mellitus with complication ([place])   . Lobar pneumonia ([place])   . Renal insufficiency 10/25/2016  . Encounter for therapeutic drug monitoring 10/20/2016  . Hx: UTI (urinary tract infection) 10/20/2016  . Hypothyroidism 09/15/2016  . Gastroesophageal reflux disease without esophagitis 07/09/2015  . Falls 06/22/2015  . Diabetes mellitus type II, uncontrolled ([place]) 05/29/2015  . Paroxysmal atrial fibrillation ([place]) 05/29/2015  . Hyperlipidemia 05/29/2015  . Hx of completed stroke 05/29/2015    Percival Spanish, PT, MPT 02/06/2017, 12:08 PM  [facility] [address]  [place], [place], [address] Phone: [phone]   Fax:  [phone]  Name: Gerald [facility] MRN: [id number] Date of Birth: 04-13-1924

## 2017-02-11 ENCOUNTER — Ambulatory Visit: Payer: Medicare Other | Admitting: Physical Therapy

## 2017-02-11 DIAGNOSIS — R262 Difficulty in walking, not elsewhere classified: Secondary | ICD-10-CM

## 2017-02-11 DIAGNOSIS — M6281 Muscle weakness (generalized): Secondary | ICD-10-CM

## 2017-02-11 DIAGNOSIS — R2689 Other abnormalities of gait and mobility: Secondary | ICD-10-CM | POA: Diagnosis not present

## 2017-02-11 DIAGNOSIS — R2681 Unsteadiness on feet: Secondary | ICD-10-CM | POA: Diagnosis not present

## 2017-02-11 NOTE — Therapy (Signed)
[facility] [address]  [place], [place], [address] Phone: [phone]   Fax:  [phone]  Physical Therapy Treatment  Patient Details  Name: Gerald [facility] MRN: [id number] Date of Birth: 05/31/1924 Referring Provider: [name], PA-C  Encounter Date: 02/11/2017      PT End of Session - 02/11/17 1017    Visit Number 8   Number of Visits 16   Date for PT Re-Evaluation 03/21/17   Authorization Type Medicare / AARP   PT Start Time 1017   PT Stop Time 1102   PT Time Calculation (min) 45 min   Activity Tolerance Patient tolerated treatment well   Behavior During Therapy [facility] for tasks assessed/performed      Past Medical History:  Diagnosis Date  . A-fib ([place])   . Cataracts, bilateral   . Chronic dermatitis   . Diabetes type 2, controlled ([place]) 2000  . History of chicken pox   . Mumps    Adult  . Retinopathy   . Shingles   . Stroke [facility]) Nov 2011  . Undescended testicle, unilateral    Right  . Whooping cough     Past Surgical History:  Procedure Laterality Date  . CARDIAC VALVE REPLACEMENT    . PRE-MALIGNANT / BENIGN SKIN LESION EXCISION    . TESTICLE SURGERY     Right, undescended  . TOOTH EXTRACTION      There were no vitals filed for this visit.      Subjective Assessment - 02/11/17 1022    Subjective Pt's wife concerned about pt's ability to keep up with the pace of the PWR! Moves.   Patient is accompained by: --  caregiver - Selinda Eon   Pertinent History Mutiple hospitalizations overpast year for urinary sepsis & PNA; recurrent UTI's secondary renal insufficieny requiring catheterization by wife; DM; A-fib: h/o CVA 2011   Patient Stated Goals "To improve my balance & make my hip hurt less"   Currently in Pain? No/denies                         [facility] Adult PT Treatment/Exercise - 02/11/17 1017      Ambulation/Gait   Ambulation/Gait Assistance 4: Min guard    Ambulation/Gait Assistance Details promoted carryover of stepping patterns gait to facilitate  increased foot clearance with good heel strike and longer step/stride length    Ambulation Distance (Feet) 270 Feet   Assistive device None   Gait Pattern Step-through pattern;Decreased hip/knee flexion - right;Decreased hip/knee flexion - left;Decreased trunk rotation;Poor foot clearance - left;Poor foot clearance - right;Decreased arm swing - right;Decreased arm swing - left   Ambulation Surface Level;Indoor     Knee/Hip Exercises: Aerobic   Nustep lvl 6 x 6' (LE only)     Knee/Hip Exercises: Standing   Other Standing Knee Exercises Alt toe tap from [employer] foam to 8" step x20 - 2 LOB requiring PT assistance to correct     Knee/Hip Exercises: Seated   Sit to Sand 5 reps;2 sets;without UE support  one foot on foam [employer]irex pad           PWR [facility]) - 02/11/17 1233    PWR! exercises Moves in sitting;Functional moves   PWR! Sit to Stand x10   PWR! Up x20   PWR! Rock [employer]! Twist 2x20   PWR! Step x20   Comments extra time spent on PWR! Twist as pt still  having difficulty coordinating motion          Balance Exercises - 02/11/17 1017      Balance Exercises: Standing   Step Over Hurdles / [place] Alt step over balance buttons to promote improved foot clearance & increased step length x6 passes             PT Short Term Goals - 02/04/17 1059      PT SHORT TERM GOAL #1   Title Complete balance testing via Merrilee Jansky +/- DGI/FGA as indicated by 01/06/17   Status Achieved     PT SHORT TERM GOAL #2   Title Pt & caregiver will be independent with initial HEP by 02/07/17   Status Achieved           PT Long Term Goals - 01/23/17 1325      PT LONG TERM GOAL #1   Title Pt & caregiver will be independent with advanced HEP as indicated by 03/21/17   Status On-going     PT LONG TERM GOAL #2   Title Pt will improve bilateral LE strength to 4/5 or greater by 03/21/17   Status On-going      PT LONG TERM GOAL #3   Title Pt will improve gait speed to 3.0 ft/sec or greater for improved safety with community ambulation by 03/21/17   Status On-going     PT LONG TERM GOAL #4   Title Pt will demonstrate improvement on the Berg Balance Scale to 44/56 or greater to reduce risk for falls by 03/21/17   Status On-going     PT LONG TERM GOAL #5   Title Pt will complete the normal, manual & cogitive TUGs to within 2 sec difference in time to reduce risk for falls by 03/21/17   Status On-going     PT LONG TERM GOAL #6   Title Pt will demonstrate improvement on the DGI to 15/24 or greater to reduce risk for falls during gait by 03/21/17   Status On-going               Plan - 02/11/17 1026    Clinical Impression Statement Pt's wife, Elle, called clinic yesterday expressing concern regarding pt feeling unsuccessful with PWR! Moves and pt concerned that PWR! Moves indicating that he has diagnosis of Parkinson's. Reassured wife on the phone and pt & cargeiver in person today, that PWR! Moves being utilized to promote larger, more fluid movement patterns to improve functional stability, and that by no means was diagnosis of Parkinson's intended. Also reinforced the idea that therapy activities are designed to challenge pt and the expectation is that pt should not necessarily be able to perform activity perfectly on initial attempts. Pt, caregiver & wife verbalized understanding of today's discussion.   Rehab Potential Good   Clinical Impairments Affecting Rehab Potential Mutiple hospitalizations overpast year for urinary sepsis & PNA; recurrent UTI's secondary renal insufficieny requiring catheterization by wife; DM; A-fib: h/o CVA 2011   PT Treatment/Interventions Patient/family education;ADLs/Self Care Home Management;Therapeutic exercise;Therapeutic activities;Functional mobility training;Gait training;Neuromuscular re-education;Balance training;Manual techniques   PT Next Visit Plan  functional strengthening, balance and dynamic gait activities; review & progress [employer] as indicated   Consulted and Agree with Plan of Care Patient;Family member/caregiver   Family Member Consulted caregiver - Sharyn Lull (in person) & wife - Evert Kohl (by phone)      Patient will benefit from skilled therapeutic intervention in order to improve the following deficits and impairments:  Decreased balance, Abnormal  gait, Difficulty walking, Pain, Decreased strength, Impaired flexibility, Decreased endurance, Decreased activity tolerance, Decreased knowledge of use of DME  Visit Diagnosis: Unsteadiness on feet  Other abnormalities of gait and mobility  Difficulty in walking, not elsewhere classified  Muscle weakness (generalized)     Problem List Patient Active Problem List   Diagnosis Date Noted  . Intermittent self-catheterization of bladder 11/22/2016  . Enlarged prostate 11/22/2016  . PNA (pneumonia) 10/31/2016  . HCAP (healthcare-associated pneumonia) 10/31/2016  . Urinary retention 10/31/2016  . Diabetes mellitus with complication ([place])   . Lobar pneumonia ([place])   . Renal insufficiency 10/25/2016  . Encounter for therapeutic drug monitoring 10/20/2016  . Hx: UTI (urinary tract infection) 10/20/2016  . Hypothyroidism 09/15/2016  . Gastroesophageal reflux disease without esophagitis 07/09/2015  . Falls 06/22/2015  . Diabetes mellitus type II, uncontrolled ([place]) 05/29/2015  . Paroxysmal atrial fibrillation ([place]) 05/29/2015  . Hyperlipidemia 05/29/2015  . Hx of completed stroke 05/29/2015    Percival Spanish, PT, MPT 02/11/2017, 12:56 PM  [facility] [address]  [place], [place], [address] Phone: [phone]   Fax:  [phone]  Name: Coley Kulikowski MRN: [id number] Date of Birth: December 17, 1923

## 2017-02-13 ENCOUNTER — Ambulatory Visit: Payer: Medicare Other

## 2017-02-13 ENCOUNTER — Encounter: Payer: Self-pay | Admitting: Physician Assistant

## 2017-02-13 DIAGNOSIS — R262 Difficulty in walking, not elsewhere classified: Secondary | ICD-10-CM | POA: Diagnosis not present

## 2017-02-13 DIAGNOSIS — R2681 Unsteadiness on feet: Secondary | ICD-10-CM

## 2017-02-13 DIAGNOSIS — R2689 Other abnormalities of gait and mobility: Secondary | ICD-10-CM | POA: Diagnosis not present

## 2017-02-13 DIAGNOSIS — M6281 Muscle weakness (generalized): Secondary | ICD-10-CM | POA: Diagnosis not present

## 2017-02-13 NOTE — Therapy (Addendum)
[facility] [address]  [place], [place], [address] Phone: [phone]   Fax:  [phone]  Physical Therapy Treatment  Patient Details  Name: Gerald Holt MRN: [id number] Date of Birth: Jun 05, 1924 Referring Provider: [name], PA-C  Encounter Date: 02/13/2017      PT End of Session - 02/13/17 1021    Visit Number 9   Number of Visits 16   Date for PT Re-Evaluation 03/21/17   Authorization Type Medicare / AARP   PT Start Time 1017   PT Stop Time 1058   PT Time Calculation (min) 41 min   Activity Tolerance Patient tolerated treatment well   Behavior During Therapy [facility] for tasks assessed/performed      Past Medical History:  Diagnosis Date  . A-fib ([place])   . Cataracts, bilateral   . Chronic dermatitis   . Diabetes type 2, controlled ([place]) 2000  . History of chicken pox   . Mumps    Adult  . Retinopathy   . Shingles   . Stroke [facility]) Nov 2011  . Undescended testicle, unilateral    Right  . Whooping cough     Past Surgical History:  Procedure Laterality Date  . CARDIAC VALVE REPLACEMENT    . PRE-MALIGNANT / BENIGN SKIN LESION EXCISION    . TESTICLE SURGERY     Right, undescended  . TOOTH EXTRACTION      There were no vitals filed for this visit.      Subjective Assessment - 02/13/17 1020    Subjective Pt. noting R groin muscle feels, "tired" and "just darn hurts sometimes" however not bothering him to start treatment.     Patient Stated Goals "To improve my balance & make my hip hurt less"   Currently in Pain? No/denies   Pain Score 0-No pain   Multiple Pain Sites No                         [facility] Adult PT Treatment/Exercise - 02/13/17 1044      Neuro Re-ed    Neuro Re-ed Details  at counter: UE support; tandem walk x 2 laps, B SLS 6 x 10 sec each, Side stepping x 2 laps      Knee/Hip Exercises: Aerobic   Nustep lvl 6 x 6' (LE only)     Knee/Hip Exercises:  Seated   Ball Squeeze 10 x 5" hold    Clamshell with TheraBand Red  5" hold 2 x 10 reps    Marching Limitations seated march with red TB around knees 2 x 10 reps each    Sit to Sand 2 sets;without UE support;10 reps  no UE support 1st set; pushoff from knees 2nd set            [facility]) - 02/13/17 1101    PWR! exercises Moves in sitting   PWR! Up 20   PWR! Twist 20               PT Short Term Goals - 02/04/17 1059      PT SHORT TERM GOAL #1   Title Complete balance testing via Merrilee Jansky +/- DGI/FGA as indicated by 01/06/17   Status Achieved     PT SHORT TERM GOAL #2   Title Pt & caregiver will be independent with initial HEP by 02/07/17   Status Achieved           PT Long Term  Goals - 01/23/17 1325      PT LONG TERM GOAL #1   Title Pt & caregiver will be independent with advanced HEP as indicated by 03/21/17   Status On-going     PT LONG TERM GOAL #2   Title Pt will improve bilateral LE strength to 4/5 or greater by 03/21/17   Status On-going     PT LONG TERM GOAL #3   Title Pt will improve gait speed to 3.0 ft/sec or greater for improved safety with community ambulation by 03/21/17   Status On-going     PT LONG TERM GOAL #4   Title Pt will demonstrate improvement on the Berg Balance Scale to 44/56 or greater to reduce risk for falls by 03/21/17   Status On-going     PT LONG TERM GOAL #5   Title Pt will complete the normal, manual & cogitive TUGs to within 2 sec difference in time to reduce risk for falls by 03/21/17   Status On-going     PT LONG TERM GOAL #6   Title Pt will demonstrate improvement on the DGI to 15/24 or greater to reduce risk for falls during gait by 03/21/17   Status On-going               Plan - 02/13/17 1058    Clinical Impression Statement Pt. doing well today.  Some complaint of muscular soreness in R groin however pain gone in treatment.  Strengthening activity focused on improving pt. ability to stand from low surfaces.  Some  review of [place] balance activities today with pt. and caregiver with cueing required for pacing and upright posture.  Pt. tolerated all activities well in treatment however with LE fatigue to end therex.     PT Treatment/Interventions Patient/family education;ADLs/Self Care Home Management;Therapeutic exercise;Therapeutic activities;Functional mobility training;Gait training;Neuromuscular re-education;Balance training;Manual techniques   PT Next Visit Plan 10th visit G-code; functional strengthening, balance and dynamic gait activities; review & progress [employer] as indicated      Patient will benefit from skilled therapeutic intervention in order to improve the following deficits and impairments:  Decreased balance, Abnormal gait, Difficulty walking, Pain, Decreased strength, Impaired flexibility, Decreased endurance, Decreased activity tolerance, Decreased knowledge of use of DME  Visit Diagnosis: Unsteadiness on feet  Other abnormalities of gait and mobility  Difficulty in walking, not elsewhere classified  Muscle weakness (generalized)     Problem List Patient Active Problem List   Diagnosis Date Noted  . Intermittent self-catheterization of bladder 11/22/2016  . Enlarged prostate 11/22/2016  . PNA (pneumonia) 10/31/2016  . HCAP (healthcare-associated pneumonia) 10/31/2016  . Urinary retention 10/31/2016  . Diabetes mellitus with complication ([place])   . Lobar pneumonia ([place])   . Renal insufficiency 10/25/2016  . Encounter for therapeutic drug monitoring 10/20/2016  . Hx: UTI (urinary tract infection) 10/20/2016  . Hypothyroidism 09/15/2016  . Gastroesophageal reflux disease without esophagitis 07/09/2015  . Falls 06/22/2015  . Diabetes mellitus type II, uncontrolled ([place]) 05/29/2015  . Paroxysmal atrial fibrillation ([place]) 05/29/2015  . Hyperlipidemia 05/29/2015  . Hx of completed stroke 05/29/2015    Bess Harvest, PTA 02/13/17 12:59 PM  [facility] [address]  [place], [place], [address] Phone: [phone]   Fax:  [phone]  Name: Gerald Holt MRN: [id number] Date of Birth: 08-08-1924   PHYSICAL THERAPY DISCHARGE SUMMARY  Visits from Start of Care: 9   Current functional level related to goals / functional  outcomes:   Unable to assess as pt hospitalized on 02/17/17 for UTI. Pt discharged from hospital on 02/20/17, but will be receiving [facility] services upon return home, therefore will proceed with discharge from PT for this episode.   Remaining deficits:   As above.   Education / Equipment:   HEP   G-Codes - 01-22-2017 1908    Functional Assessment Tool Used (Outpatient Only) Clinical judgment    Functional Limitation Mobility: Walking and moving around   Mobility: Walking and Moving Around Goal Status [phone]) At least 20 percent but less than 40 percent impaired, limited or restricted   Mobility: Walking and Moving Around Discharge Status [phone]) At least 40 percent but less than 60 percent impaired, limited or restricted     Plan: Patient agrees to discharge.  Patient goals were partially met. Patient is being discharged due to a change in medical status.  ?????     Percival Spanish, PT, MPT 02/20/17, 4:49 PM  [facility] [address]  [place], [place], [address] Phone: [phone]   Fax:  [phone]

## 2017-02-17 ENCOUNTER — Encounter (HOSPITAL_COMMUNITY): Payer: Self-pay | Admitting: Emergency Medicine

## 2017-02-17 ENCOUNTER — Inpatient Hospital Stay (HOSPITAL_COMMUNITY)
Admission: EM | Admit: 2017-02-17 | Discharge: 2017-02-20 | DRG: 690 | Disposition: A | Discharge status: Home Health | Payer: Medicare Other | Attending: Internal Medicine | Admitting: Internal Medicine

## 2017-02-17 DIAGNOSIS — IMO0002 Reserved for concepts with insufficient information to code with codable children: Secondary | ICD-10-CM | POA: Diagnosis present

## 2017-02-17 DIAGNOSIS — I480 Paroxysmal atrial fibrillation: Secondary | ICD-10-CM | POA: Diagnosis not present

## 2017-02-17 DIAGNOSIS — Z8673 Personal history of transient ischemic attack (TIA), and cerebral infarction without residual deficits: Secondary | ICD-10-CM

## 2017-02-17 DIAGNOSIS — E538 Deficiency of other specified B group vitamins: Secondary | ICD-10-CM | POA: Diagnosis present

## 2017-02-17 DIAGNOSIS — Z87891 Personal history of nicotine dependence: Secondary | ICD-10-CM

## 2017-02-17 DIAGNOSIS — D649 Anemia, unspecified: Secondary | ICD-10-CM | POA: Diagnosis not present

## 2017-02-17 DIAGNOSIS — E039 Hypothyroidism, unspecified: Secondary | ICD-10-CM | POA: Diagnosis present

## 2017-02-17 DIAGNOSIS — K5900 Constipation, unspecified: Secondary | ICD-10-CM | POA: Diagnosis present

## 2017-02-17 DIAGNOSIS — Z952 Presence of prosthetic heart valve: Secondary | ICD-10-CM

## 2017-02-17 DIAGNOSIS — K219 Gastro-esophageal reflux disease without esophagitis: Secondary | ICD-10-CM | POA: Diagnosis not present

## 2017-02-17 DIAGNOSIS — E11319 Type 2 diabetes mellitus with unspecified diabetic retinopathy without macular edema: Secondary | ICD-10-CM | POA: Diagnosis present

## 2017-02-17 DIAGNOSIS — E860 Dehydration: Secondary | ICD-10-CM | POA: Diagnosis present

## 2017-02-17 DIAGNOSIS — E871 Hypo-osmolality and hyponatremia: Secondary | ICD-10-CM | POA: Diagnosis not present

## 2017-02-17 DIAGNOSIS — R531 Weakness: Secondary | ICD-10-CM

## 2017-02-17 DIAGNOSIS — N401 Enlarged prostate with lower urinary tract symptoms: Secondary | ICD-10-CM | POA: Diagnosis present

## 2017-02-17 DIAGNOSIS — R404 Transient alteration of awareness: Secondary | ICD-10-CM | POA: Diagnosis not present

## 2017-02-17 DIAGNOSIS — R338 Other retention of urine: Secondary | ICD-10-CM | POA: Diagnosis present

## 2017-02-17 DIAGNOSIS — Z960 Presence of urogenital implants: Secondary | ICD-10-CM

## 2017-02-17 DIAGNOSIS — R112 Nausea with vomiting, unspecified: Secondary | ICD-10-CM | POA: Diagnosis present

## 2017-02-17 DIAGNOSIS — N390 Urinary tract infection, site not specified: Secondary | ICD-10-CM | POA: Diagnosis present

## 2017-02-17 DIAGNOSIS — Z833 Family history of diabetes mellitus: Secondary | ICD-10-CM

## 2017-02-17 DIAGNOSIS — Z7984 Long term (current) use of oral hypoglycemic drugs: Secondary | ICD-10-CM

## 2017-02-17 DIAGNOSIS — E785 Hyperlipidemia, unspecified: Secondary | ICD-10-CM | POA: Diagnosis present

## 2017-02-17 DIAGNOSIS — Z886 Allergy status to analgesic agent status: Secondary | ICD-10-CM

## 2017-02-17 DIAGNOSIS — Z87440 Personal history of urinary (tract) infections: Secondary | ICD-10-CM

## 2017-02-17 DIAGNOSIS — Z8619 Personal history of other infectious and parasitic diseases: Secondary | ICD-10-CM

## 2017-02-17 DIAGNOSIS — E1165 Type 2 diabetes mellitus with hyperglycemia: Secondary | ICD-10-CM | POA: Diagnosis not present

## 2017-02-17 DIAGNOSIS — Z7982 Long term (current) use of aspirin: Secondary | ICD-10-CM

## 2017-02-17 DIAGNOSIS — Z888 Allergy status to other drugs, medicaments and biological substances status: Secondary | ICD-10-CM

## 2017-02-17 DIAGNOSIS — Z66 Do not resuscitate: Secondary | ICD-10-CM | POA: Diagnosis present

## 2017-02-17 DIAGNOSIS — Z91013 Allergy to seafood: Secondary | ICD-10-CM

## 2017-02-17 DIAGNOSIS — Z978 Presence of other specified devices: Secondary | ICD-10-CM | POA: Diagnosis present

## 2017-02-17 DIAGNOSIS — H269 Unspecified cataract: Secondary | ICD-10-CM | POA: Diagnosis present

## 2017-02-17 DIAGNOSIS — Z79899 Other long term (current) drug therapy: Secondary | ICD-10-CM

## 2017-02-17 MED ORDER — ONDANSETRON HCL 4 MG/2ML IJ SOLN: 4.0000 mg | Freq: Once | INTRAMUSCULAR | Status: DC

## 2017-02-17 MED ORDER — SODIUM CHLORIDE 0.9 % IV BOLUS (SEPSIS)
1000.0000 mL | Freq: Once | INTRAVENOUS | Status: AC
  Administered 2017-02-18: 1000 mL via INTRAVENOUS

## 2017-02-17 NOTE — ED Provider Notes (Signed)
[facility] DEPT Provider Note   CSN: [id number] Arrival date & time: 02/17/17  2306   By signing my name below, I, Eunice Blase, attest that this documentation has been prepared under the direction and in the presence of Delora Fuel, MD. Electronically signed, Eunice Blase, ED Scribe. 02/17/17. 11:30 PM.   History   Chief Complaint Chief Complaint  Patient presents with  . Weakness   The history is provided by the patient and medical records. No language interpreter was used.    Gerald Holt is a 83 y.o. male with h/o UTI's, stroke, DM and A-fib, BIB EMS to the Emergency Department with concern for weakness onset earlier today. Pt expresses concern for episodic vomiting ~4:30 PM today. No nausea currently. No pain, fever, chills, diaphoresis, diarrhea or any other complaints noted at this time.   Past Medical History:  Diagnosis Date  . A-fib ([place])   . Cataracts, bilateral   . Chronic dermatitis   . Diabetes type 2, controlled ([place]) 2000  . History of chicken pox   . Mumps    Adult  . Retinopathy   . Shingles   . Stroke [facility]) Nov 2011  . Undescended testicle, unilateral    Right  . Whooping cough     Patient Active Problem List   Diagnosis Date Noted  . Intermittent self-catheterization of bladder 11/22/2016  . Enlarged prostate 11/22/2016  . PNA (pneumonia) 10/31/2016  . HCAP (healthcare-associated pneumonia) 10/31/2016  . Urinary retention 10/31/2016  . Diabetes mellitus with complication ([place])   . Lobar pneumonia ([place])   . Renal insufficiency 10/25/2016  . Encounter for therapeutic drug monitoring 10/20/2016  . Hx: UTI (urinary tract infection) 10/20/2016  . Hypothyroidism 09/15/2016  . Gastroesophageal reflux disease without esophagitis 07/09/2015  . Falls 06/22/2015  . Diabetes mellitus type II, uncontrolled ([place]) 05/29/2015  . Paroxysmal atrial fibrillation ([place]) 05/29/2015  . Hyperlipidemia 05/29/2015  . Hx of completed stroke 05/29/2015     Past Surgical History:  Procedure Laterality Date  . CARDIAC VALVE REPLACEMENT    . PRE-MALIGNANT / BENIGN SKIN LESION EXCISION    . TESTICLE SURGERY     Right, undescended  . TOOTH EXTRACTION         Home Medications    Prior to Admission medications   Medication Sig Start Date End Date Taking? Authorizing Provider  amoxicillin-clavulanate (AUGMENTIN) 500-125 MG tablet Take 1 tablet (500 mg total) by mouth 2 (two) times daily. 01/22/17   [name], PA-C  aspirin 81 MG tablet Take 1 tablet (81 mg total) by mouth daily. 06/22/15   Jerline Pain, MD  Blood Glucose Monitoring Suppl (RELION PRIME MONITOR) DEVI Use to check fasting sugars daily. E 11.9 11/18/16   [name], PA-C  Calcium Carbonate Antacid (ALKA-SELTZER ANTACID PO) Take 2 tablets by mouth as needed (for indegestion).     [provider]  cyanocobalamin (,VITAMIN B-12,) 1000 MCG/ML injection Inject 1000 mcg intramuscular daily for 7 days, then once a week for 4 weeks, then once a month for a year Patient taking differently: Inject 1,000 mcg into the muscle every 30 (thirty) days.  04/02/16   Pieter Partridge, DO  docusate sodium (COLACE) 100 MG capsule Take 100-200 mg by mouth See admin instructions. Take 100 mg by mouth daily for 2 days, then on every third day take 200 mg by mouth daily. Alternate 100 mg and 200 mg    [provider]  ELDERBERRY PO Take 1 tablet by mouth  daily.    [provider]  Eyelid Cleansers (STERILID EX) Apply 1 application topically See admin instructions. Eye Wash: Once Daily     [provider]  glipiZIDE (GLUCOTROL XL) 2.5 MG 24 hr tablet Take 1 tablet (2.5 mg total) by mouth daily with breakfast. Patient not taking: Reported on 01/22/2017 10/17/16   [name], PA-C  glucose blood test strip One Touch Verio strips Use as instructed to check fasting sugars once daily .Dx:E11.9 11/20/16   [name], PA-C  levothyroxine (SYNTHROID,  LEVOTHROID) 50 MCG tablet Take 1 tablet (50 mcg total) by mouth daily. 04/09/16   [name], PA-C  lisinopril (PRINIVIL,ZESTRIL) 2.5 MG tablet Take 1 tablet by mouth daily. 12/02/16   [provider]  lovastatin (MEVACOR) 10 MG tablet Take 5 mg by mouth at bedtime.  08/21/16   [provider]  metFORMIN (GLUCOPHAGE-XR) 500 MG 24 hr tablet Take 2 tablets with breakfast and at bedtime Patient taking differently: Take 1,000 mg by mouth 2 (two) times daily.  08/21/16   [name], PA-C  metoprolol tartrate (LOPRESSOR) 25 MG tablet Take 0.5 tablets (12.5 mg total) by mouth 2 (two) times daily. 04/09/16   [name], PA-C  Miconazole Nitrate (TRIPLE PASTE AF) 2 % OINT Apply 1 application topically once a week.     [provider]  Omega-3 Fatty Acids (SB OMEGA-3 FISH OIL) 1000 MG CAPS Take 1,000 mg by mouth 2 (two) times daily. Reported on 10/06/2015    [provider]  OVER THE COUNTER MEDICATION Take 1 capsule by mouth 2 (two) times daily. OTC Focus Select    [provider]  Probiotic Product (PROBIOTIC-10 PO) Take 1 capsule by mouth daily.    [provider]  triamcinolone cream (KENALOG) 0.1 % Apply 1 application topically as needed (for itching).  02/01/16   [provider]    Family History Family History  Problem Relation Age of Onset  . Pneumonia Mother 73       Deceased  . GI Bleed Father 64       Deceased - Ulcers  . Diabetes Cousin   . Diabetes Brother   . Cancer Paternal Aunt     Social History Social History  Substance Use Topics  . Smoking status: Former Research scientist (life sciences)  . Smokeless tobacco: Never Used     Comment: Hasn't smoked since age 43  . Alcohol use No     Allergies   Flomax [tamsulosin hcl]; Shellfish allergy; Aspirin-dipyridamole er; and Other   Review of Systems Review of Systems  Constitutional: Negative for chills, diaphoresis and fever.  Cardiovascular: Negative for chest pain.   Gastrointestinal: Positive for vomiting. Negative for abdominal pain, diarrhea and nausea.  Musculoskeletal: Negative for arthralgias, back pain and myalgias.  Neurological: Positive for weakness.  All other systems reviewed and are negative.    Physical Exam Updated Vital Signs Temp 99.7 F (37.6 C) (Oral)   Resp (!) 22   SpO2 93%   Physical Exam  Constitutional: He is oriented to person, place, and time. He appears well-developed and well-nourished.  Frail appearing  HENT:  Head: Normocephalic and atraumatic.  Eyes: EOM are normal. Pupils are equal, round, and reactive to light.  Neck: Normal range of motion. Neck supple. No JVD present.  Cardiovascular: Normal rate and regular rhythm.   Murmur heard. 2/6 holosystolic murmur along the L sternal border  Pulmonary/Chest: Effort normal and breath sounds normal. He has no wheezes.  He has no rales. He exhibits no tenderness.  Abdominal: Soft. He exhibits no distension and no mass. Bowel sounds are decreased. There is no tenderness.  Musculoskeletal: Normal range of motion. He exhibits no edema.  Lymphadenopathy:    He has no cervical adenopathy.  Neurological: He is alert and oriented to person, place, and time. No cranial nerve deficit. He exhibits normal muscle tone. Coordination normal.  Skin: Skin is warm and dry. No rash noted.  Psychiatric: He has a normal mood and affect. His behavior is normal. Judgment and thought content normal.  Nursing note and vitals reviewed.    ED Treatments / Results  DIAGNOSTIC STUDIES: Oxygen Saturation is 93% on RA, low by my interpretation.    COORDINATION OF CARE: 11:24 PM-Discussed next steps with pt. Pt verbalized understanding and is agreeable with the plan. Will order medications and labs.   Labs (all labs ordered are listed, but only abnormal results are displayed) Labs Reviewed  COMPREHENSIVE METABOLIC PANEL - Abnormal; Notable for the following:       Result Value   Sodium  133 (*)    Chloride 97 (*)    Glucose, Bld 196 (*)    All other components within normal limits  CBC WITH DIFFERENTIAL/PLATELET - Abnormal; Notable for the following:    WBC 19.8 (*)    Hemoglobin 12.6 (*)    HCT 37.5 (*)    Neutro Abs 16.6 (*)    Monocytes Absolute 1.4 (*)    All other components within normal limits  URINALYSIS, ROUTINE W REFLEX MICROSCOPIC - Abnormal; Notable for the following:    Ketones, ur 5 (*)    Leukocytes, UA MODERATE (*)    All other components within normal limits  CK - Abnormal; Notable for the following:    Total CK 11 (*)    All other components within normal limits  CBC - Abnormal; Notable for the following:    WBC 16.9 (*)    RBC 4.13 (*)    Hemoglobin 11.7 (*)    HCT 34.9 (*)    All other components within normal limits  GLUCOSE, CAPILLARY - Abnormal; Notable for the following:    Glucose-Capillary 186 (*)    All other components within normal limits  LIPASE, BLOOD  TROPONIN I  TSH  TROPONIN I  CREATININE, SERUM    EKG  EKG Interpretation  Date/Time:  Monday Feb 17 2017 23:44:11 EDT Ventricular Rate:  93 PR Interval:    QRS Duration: 133 QT Interval:  399 QTC Calculation: 497 R Axis:   74 Text Interpretation:  Sinus rhythm Right bundle branch block When compared with ECG of 12/27/2016, No significant change was found Confirmed by Delora Fuel ([id number]) on 02/18/2017 12:03:58 AM       Radiology Dg Chest Port 1 View  Result Date: 02/18/2017 CLINICAL DATA:  83 year old male with weakness. History of diabetes. EXAM: PORTABLE CHEST 1 VIEW COMPARISON:  Chest radiograph dated 12/27/2016 FINDINGS: There is shallow inspiration with bibasilar atelectatic changes. No focal consolidation, pleural effusion, or pneumothorax. The cardiac silhouette appears stable in similar to prior study. There is atherosclerotic calcification of the aortic arch. No acute osseous pathology. IMPRESSION: No active disease. Electronically Signed   By: Anner Crete  M.D.   On: 02/18/2017 00:15    Procedures Procedures (including critical care time)  Medications Ordered in ED Medications  sodium chloride 0.9 % bolus 1,000 mL (1,000 mLs Intravenous New Bag/Given 02/18/17 0101)  ondansetron (ZOFRAN) injection 4 mg (4  mg Intravenous Refused 02/18/17 0103)  cefTRIAXone (ROCEPHIN) 1 g in dextrose 5 % 50 mL IVPB (not administered)     Initial Impression / Assessment and Plan / ED Course  I have reviewed the triage vital signs and the nursing notes.  Pertinent labs & imaging results that were available during my care of the patient were reviewed by me and considered in my medical decision making (see chart for details).  Patient with generalized weakness but then no obvious reason on exam. Review of old records shows that he has to self catheterize and is prone to frequent UTIs. Workup is initiated including a chest x-ray and urinalysis. He was found to have a mild anemia which is unchanged from baseline, and significantly elevated WBC. Chest x-ray is unremarkable. Urinalysis shows too numerous to count WBCs. He is started on ceftriaxone. Case is discussed with Dr. Hal Hope of triad hospitalists who agrees to admit the patient.  Final Clinical Impressions(s) / ED Diagnoses   Final diagnoses:  Urinary tract infection without hematuria, site unspecified  Weakness  Hyponatremia  Normochromic normocytic anemia    New Prescriptions New Prescriptions   No medications on file   I personally performed the services described in this documentation, which was scribed in my presence. The recorded information has been reviewed and is accurate.       Delora Fuel, MD 81/85/63 [phone]

## 2017-02-17 NOTE — ED Triage Notes (Signed)
Per EMS, this afternoon around 4:30pm pt vomited x1, has had increased weakness and balance issues from normal. Hx of dementia. Pt baseline disoriented to month and day. Pt follows commands and moves all extremities. Pt has hx of UTI's with sepsis. Pt's wife caths him 4-5x/day and wife noticed urine has been cloudy on and off past 2 weeks. Hx of diabetes. EMS CBG 182. EMS VS 136/80, HR 110, 94% on 2L, RR18.

## 2017-02-18 ENCOUNTER — Telehealth: Payer: Self-pay | Admitting: Physician Assistant

## 2017-02-18 ENCOUNTER — Other Ambulatory Visit: Payer: Self-pay | Admitting: Physician Assistant

## 2017-02-18 ENCOUNTER — Emergency Department (HOSPITAL_COMMUNITY): Payer: Medicare Other

## 2017-02-18 ENCOUNTER — Encounter (HOSPITAL_COMMUNITY): Payer: Self-pay | Admitting: Internal Medicine

## 2017-02-18 ENCOUNTER — Encounter: Payer: Self-pay | Admitting: Physician Assistant

## 2017-02-18 ENCOUNTER — Ambulatory Visit: Payer: Medicare Other

## 2017-02-18 DIAGNOSIS — E860 Dehydration: Secondary | ICD-10-CM | POA: Diagnosis present

## 2017-02-18 DIAGNOSIS — Z8619 Personal history of other infectious and parasitic diseases: Secondary | ICD-10-CM | POA: Diagnosis not present

## 2017-02-18 DIAGNOSIS — R338 Other retention of urine: Secondary | ICD-10-CM | POA: Diagnosis present

## 2017-02-18 DIAGNOSIS — Z66 Do not resuscitate: Secondary | ICD-10-CM | POA: Diagnosis present

## 2017-02-18 DIAGNOSIS — K5900 Constipation, unspecified: Secondary | ICD-10-CM | POA: Diagnosis present

## 2017-02-18 DIAGNOSIS — Z8673 Personal history of transient ischemic attack (TIA), and cerebral infarction without residual deficits: Secondary | ICD-10-CM | POA: Diagnosis not present

## 2017-02-18 DIAGNOSIS — Z789 Other specified health status: Secondary | ICD-10-CM | POA: Diagnosis not present

## 2017-02-18 DIAGNOSIS — I480 Paroxysmal atrial fibrillation: Secondary | ICD-10-CM | POA: Diagnosis present

## 2017-02-18 DIAGNOSIS — Z886 Allergy status to analgesic agent status: Secondary | ICD-10-CM | POA: Diagnosis not present

## 2017-02-18 DIAGNOSIS — E538 Deficiency of other specified B group vitamins: Secondary | ICD-10-CM | POA: Diagnosis present

## 2017-02-18 DIAGNOSIS — Z952 Presence of prosthetic heart valve: Secondary | ICD-10-CM | POA: Diagnosis not present

## 2017-02-18 DIAGNOSIS — N390 Urinary tract infection, site not specified: Secondary | ICD-10-CM | POA: Diagnosis present

## 2017-02-18 DIAGNOSIS — R531 Weakness: Secondary | ICD-10-CM

## 2017-02-18 DIAGNOSIS — K219 Gastro-esophageal reflux disease without esophagitis: Secondary | ICD-10-CM | POA: Diagnosis present

## 2017-02-18 DIAGNOSIS — N401 Enlarged prostate with lower urinary tract symptoms: Secondary | ICD-10-CM | POA: Diagnosis present

## 2017-02-18 DIAGNOSIS — D649 Anemia, unspecified: Secondary | ICD-10-CM | POA: Diagnosis present

## 2017-02-18 DIAGNOSIS — H269 Unspecified cataract: Secondary | ICD-10-CM | POA: Diagnosis present

## 2017-02-18 DIAGNOSIS — Z87891 Personal history of nicotine dependence: Secondary | ICD-10-CM | POA: Diagnosis not present

## 2017-02-18 DIAGNOSIS — Z833 Family history of diabetes mellitus: Secondary | ICD-10-CM | POA: Diagnosis not present

## 2017-02-18 DIAGNOSIS — E11319 Type 2 diabetes mellitus with unspecified diabetic retinopathy without macular edema: Secondary | ICD-10-CM | POA: Diagnosis present

## 2017-02-18 DIAGNOSIS — E039 Hypothyroidism, unspecified: Secondary | ICD-10-CM | POA: Diagnosis present

## 2017-02-18 DIAGNOSIS — R112 Nausea with vomiting, unspecified: Secondary | ICD-10-CM | POA: Diagnosis present

## 2017-02-18 DIAGNOSIS — E1165 Type 2 diabetes mellitus with hyperglycemia: Secondary | ICD-10-CM | POA: Diagnosis not present

## 2017-02-18 DIAGNOSIS — Z87440 Personal history of urinary (tract) infections: Secondary | ICD-10-CM | POA: Diagnosis not present

## 2017-02-18 DIAGNOSIS — E871 Hypo-osmolality and hyponatremia: Secondary | ICD-10-CM | POA: Diagnosis present

## 2017-02-18 DIAGNOSIS — E785 Hyperlipidemia, unspecified: Secondary | ICD-10-CM | POA: Diagnosis present

## 2017-02-18 DIAGNOSIS — Z91013 Allergy to seafood: Secondary | ICD-10-CM | POA: Diagnosis not present

## 2017-02-18 LAB — CBC
HCT: 34.9 % — ABNORMAL LOW (ref 39.0–52.0)
Hemoglobin: 11.7 g/dL — ABNORMAL LOW (ref 13.0–17.0)
MCH: 28.3 pg (ref 26.0–34.0)
MCHC: 33.5 g/dL (ref 30.0–36.0)
MCV: 84.5 fL (ref 78.0–100.0)
PLATELETS: 212 10*3/uL (ref 150–400)
RBC: 4.13 MIL/uL — AB (ref 4.22–5.81)
RDW: 14.2 % (ref 11.5–15.5)
WBC: 16.9 10*3/uL — AB (ref 4.0–10.5)

## 2017-02-18 LAB — CBC WITH DIFFERENTIAL/PLATELET
Basophils Absolute: 0 10*3/uL (ref 0.0–0.1)
Basophils Relative: 0 %
EOS PCT: 4 %
Eosinophils Absolute: 0.7 10*3/uL (ref 0.0–0.7)
HCT: 37.5 % — ABNORMAL LOW (ref 39.0–52.0)
HEMOGLOBIN: 12.6 g/dL — AB (ref 13.0–17.0)
LYMPHS ABS: 1.1 10*3/uL (ref 0.7–4.0)
LYMPHS PCT: 5 %
MCH: 28.2 pg (ref 26.0–34.0)
MCHC: 33.6 g/dL (ref 30.0–36.0)
MCV: 83.9 fL (ref 78.0–100.0)
MONOS PCT: 7 %
Monocytes Absolute: 1.4 10*3/uL — ABNORMAL HIGH (ref 0.1–1.0)
NEUTROS PCT: 84 %
Neutro Abs: 16.6 10*3/uL — ABNORMAL HIGH (ref 1.7–7.7)
Platelets: 227 10*3/uL (ref 150–400)
RBC: 4.47 MIL/uL (ref 4.22–5.81)
RDW: 14.2 % (ref 11.5–15.5)
WBC: 19.8 10*3/uL — AB (ref 4.0–10.5)

## 2017-02-18 LAB — URINALYSIS, ROUTINE W REFLEX MICROSCOPIC
Bacteria, UA: NONE SEEN
Bilirubin Urine: NEGATIVE
Glucose, UA: NEGATIVE mg/dL
Hgb urine dipstick: NEGATIVE
Ketones, ur: 5 mg/dL — AB
Nitrite: NEGATIVE
PROTEIN: NEGATIVE mg/dL
Specific Gravity, Urine: 1.01 (ref 1.005–1.030)
Squamous Epithelial / HPF: NONE SEEN
pH: 6 (ref 5.0–8.0)

## 2017-02-18 LAB — COMPREHENSIVE METABOLIC PANEL
ALBUMIN: 3.5 g/dL (ref 3.5–5.0)
ALK PHOS: 45 U/L (ref 38–126)
ALT: 17 U/L (ref 17–63)
ANION GAP: 14 (ref 5–15)
AST: 23 U/L (ref 15–41)
BILIRUBIN TOTAL: 0.7 mg/dL (ref 0.3–1.2)
BUN: 19 mg/dL (ref 6–20)
CALCIUM: 9.5 mg/dL (ref 8.9–10.3)
CO2: 22 mmol/L (ref 22–32)
Chloride: 97 mmol/L — ABNORMAL LOW (ref 101–111)
Creatinine, Ser: 1.03 mg/dL (ref 0.61–1.24)
GFR calc Af Amer: >60 mL/min (ref >60)
GFR calc non Af Amer: >60 mL/min (ref >60)
GLUCOSE: 196 mg/dL — AB (ref 65–99)
Potassium: 4.6 mmol/L (ref 3.5–5.1)
SODIUM: 133 mmol/L — AB (ref 135–145)
TOTAL PROTEIN: 6.6 g/dL (ref 6.5–8.1)

## 2017-02-18 LAB — CREATININE, SERUM
CREATININE: 1.04 mg/dL (ref 0.61–1.24)
GFR calc non Af Amer: >60 mL/min (ref >60)

## 2017-02-18 LAB — GLUCOSE, CAPILLARY
GLUCOSE-CAPILLARY: 186 mg/dL — AB (ref 65–99)
GLUCOSE-CAPILLARY: 226 mg/dL — AB (ref 65–99)
GLUCOSE-CAPILLARY: 158 mg/dL — AB (ref 65–99)
GLUCOSE-CAPILLARY: 198 mg/dL — AB (ref 65–99)
Glucose-Capillary: 217 mg/dL — ABNORMAL HIGH (ref 65–99)

## 2017-02-18 LAB — TROPONIN I
Troponin I: <0.03 ng/mL (ref <0.03)
Troponin I: <0.03 ng/mL (ref <0.03)

## 2017-02-18 LAB — LIPASE, BLOOD: Lipase: 30 U/L (ref 11–51)

## 2017-02-18 LAB — CK: CK TOTAL: 11 U/L — AB (ref 49–397)

## 2017-02-18 LAB — TSH: TSH: 1.197 u[IU]/mL (ref 0.350–4.500)

## 2017-02-18 MED ORDER — ONDANSETRON HCL 4 MG PO TABS: 4.0000 mg | ORAL_TABLET | Freq: Four times a day (QID) | ORAL | Status: DC | PRN

## 2017-02-18 MED ORDER — ACETAMINOPHEN 325 MG PO TABS: 650.0000 mg | ORAL_TABLET | Freq: Four times a day (QID) | ORAL | Status: DC | PRN

## 2017-02-18 MED ORDER — ENOXAPARIN SODIUM 40 MG/0.4ML ~~LOC~~ SOLN
40.0000 mg | SUBCUTANEOUS | Status: DC
  Administered 2017-02-18 – 2017-02-20 (×3): 40 mg via SUBCUTANEOUS
  Filled 2017-02-18 (×3): qty 0.4

## 2017-02-18 MED ORDER — ONDANSETRON HCL 4 MG/2ML IJ SOLN: 4.0000 mg | Freq: Four times a day (QID) | INTRAMUSCULAR | Status: DC | PRN

## 2017-02-18 MED ORDER — INSULIN ASPART 100 UNIT/ML ~~LOC~~ SOLN
0.0000 [IU] | Freq: Three times a day (TID) | SUBCUTANEOUS | Status: DC
  Administered 2017-02-18: 3 [IU] via SUBCUTANEOUS
  Administered 2017-02-18: 2 [IU] via SUBCUTANEOUS
  Administered 2017-02-18: 3 [IU] via SUBCUTANEOUS
  Administered 2017-02-19 – 2017-02-20 (×4): 2 [IU] via SUBCUTANEOUS

## 2017-02-18 MED ORDER — PRAVASTATIN SODIUM 10 MG PO TABS
10.0000 mg | ORAL_TABLET | Freq: Every day | ORAL | Status: DC
  Administered 2017-02-18 – 2017-02-19 (×2): 10 mg via ORAL
  Filled 2017-02-18 (×3): qty 1

## 2017-02-18 MED ORDER — DEXTROSE 5 % IV SOLN
1.0000 g | Freq: Once | INTRAVENOUS | Status: AC
  Administered 2017-02-18: 1 g via INTRAVENOUS
  Filled 2017-02-18: qty 10

## 2017-02-18 MED ORDER — METOPROLOL TARTRATE 25 MG PO TABS
12.5000 mg | ORAL_TABLET | Freq: Two times a day (BID) | ORAL | Status: DC
  Administered 2017-02-18 – 2017-02-20 (×5): 12.5 mg via ORAL
  Filled 2017-02-18 (×5): qty 1

## 2017-02-18 MED ORDER — DEXTROSE 5 % IV SOLN
1.0000 g | Freq: Every day | INTRAVENOUS | Status: DC
  Administered 2017-02-18 – 2017-02-19 (×2): 1 g via INTRAVENOUS
  Filled 2017-02-18 (×2): qty 10

## 2017-02-18 MED ORDER — LISINOPRIL 2.5 MG PO TABS
2.5000 mg | ORAL_TABLET | Freq: Every day | ORAL | Status: DC
  Administered 2017-02-18 – 2017-02-20 (×3): 2.5 mg via ORAL
  Filled 2017-02-18 (×3): qty 1

## 2017-02-18 MED ORDER — LIDOCAINE HCL 2 % EX GEL
1.0000 "application " | Freq: Once | CUTANEOUS | Status: AC
  Administered 2017-02-18: 1 via URETHRAL
  Filled 2017-02-18: qty 5

## 2017-02-18 MED ORDER — ACETAMINOPHEN 650 MG RE SUPP: 650.0000 mg | Freq: Four times a day (QID) | RECTAL | Status: DC | PRN

## 2017-02-18 MED ORDER — ASPIRIN EC 81 MG PO TBEC
81.0000 mg | DELAYED_RELEASE_TABLET | Freq: Every day | ORAL | Status: DC
  Administered 2017-02-18 – 2017-02-20 (×3): 81 mg via ORAL
  Filled 2017-02-18 (×3): qty 1

## 2017-02-18 MED ORDER — LEVOTHYROXINE SODIUM 50 MCG PO TABS
50.0000 ug | ORAL_TABLET | Freq: Every day | ORAL | Status: DC
  Administered 2017-02-18 – 2017-02-20 (×3): 50 ug via ORAL
  Filled 2017-02-18 (×3): qty 1

## 2017-02-18 MED ORDER — SODIUM CHLORIDE 0.9 % IV SOLN
INTRAVENOUS | Status: AC
  Administered 2017-02-18: 04:00:00 via INTRAVENOUS

## 2017-02-18 MED ORDER — DOCUSATE SODIUM 100 MG PO CAPS
200.0000 mg | ORAL_CAPSULE | Freq: Every day | ORAL | Status: DC | PRN
  Filled 2017-02-18: qty 2

## 2017-02-18 MED ORDER — SB OMEGA-3 FISH OIL 1000 MG PO CAPS: 1000.0000 mg | ORAL_CAPSULE | Freq: Two times a day (BID) | ORAL | Status: DC

## 2017-02-18 NOTE — Telephone Encounter (Signed)
Gave the verbal order to [facility] with [place] for Catheter Coude Tip french 14 and Lubricant. Due to Medicare does not cover the [employer]. Rx for catheters will be faxed today.

## 2017-02-18 NOTE — ED Notes (Signed)
Pt requires in and out cath 4-5x/day per wife. Wife uses coude catheter.

## 2017-02-18 NOTE — Progress Notes (Signed)
New Admission Note:  Arrival Method: Stretcher from ED with RN Mental Orientation: A&O x3 Telemetry: Box 25, CCMD notified Assessment: Completed Skin: Assessed with Dorthula Nettles, RN, check flowsheets IV: L FA, NS @75  Pain: 0/10 Tubes: N/A Safety Measures: Safety Fall Prevention Plan was given, discussed and signed by patient Admission: Completed [address] Orientation: Patient has been orientated to the room, unit and the staff. Family: None at bedside  Orders have been reviewed and implemented. Will continue to monitor the patient. Call light has been placed within reach and bed alarm has been activated.   Nena Polio BSN, RN  Phone Number: [phone]

## 2017-02-18 NOTE — Evaluation (Signed)
Physical Therapy Evaluation Patient Details Name: Gerald Holt MRN: [id number] DOB: 1924/06/01 Today's Date: 02/18/2017   History of Present Illness  Gerald Holt is a 83 y.o. male with history of paroxysmal atrial fibrillation, diabetes mellitus, hyperlipidemia, hypothyroidism and uses self cath for urinary retention presents to the ER after patient had one episode of nausea vomiting following which patient was feeling weak and tired. UA consistent with UTI  Clinical Impression   Pt admitted with above diagnosis. Pt currently with functional limitations due to the deficits listed below (see PT Problem List). Prior to admission, mostly waling without assistive device, cane, prn; currently reaching out for bil UE support with amb; will plan to use next session to help discern cane versus RW for optimal assistive device;  Pt will benefit from skilled PT to increase their independence and safety with mobility to allow discharge to the venue listed below.       Follow Up Recommendations Home health PT;Supervision/Assistance - 24 hour    Equipment Recommendations  3in1 (PT)    Recommendations for Other Services OT consult     Precautions / Restrictions Precautions Precautions: Fall      Mobility  Bed Mobility Overal bed mobility: Needs Assistance Bed Mobility: Supine to Sit     Supine to sit: Min guard     General bed mobility comments: [place] for safety  Transfers Overall transfer level: Needs assistance Equipment used: 1 person hand held assist Transfers: Sit to/from Stand Sit to Stand: Min assist         General transfer comment: Min assist to steady  Ambulation/Gait Ambulation/Gait assistance: Min assist Ambulation Distance (Feet): 60 Feet Assistive device: 1 person hand held assist (and occasionally reaching out for hallway rail) Gait Pattern/deviations: Decreased step length - right;Decreased step length - left;Decreased stride length;Trunk flexed      General Gait Details: Cues to self-monitor for activity tolerance; noted tends to reach out for UE support  Stairs            Wheelchair Mobility    Modified Rankin (Stroke Patients Only)       Balance Overall balance assessment: Needs assistance           Standing balance-Leahy Scale: Poor                               Pertinent Vitals/Pain Pain Assessment: No/denies pain (but does tell me his bladder feels full)    Home Living Family/patient expects to be discharged to:: Private residence Living Arrangements: Spouse/significant other Available Help at Discharge: Family;Personal care attendant (Assist in home M-F most of day, if not all) Type of Home: House Home Access: Stairs to enter   [employer] of Steps: 1 Home Layout: One level Home Equipment: Walker - 2 wheels;Cane - single point      Prior Function Level of Independence: Needs assistance   Gait / Transfers Assistance Needed: Typically walks without assistive device; uses cane on rough days  ADL's / Homemaking Assistance Needed: Vis[facility] [place] home service; Wife and Aide help with catheterizing        Hand Dominance        Extremity/Trunk Assessment   Upper Extremity Assessment Upper Extremity Assessment: Generalized weakness    Lower Extremity Assessment Lower Extremity Assessment: Generalized weakness       Communication   Communication: HOH  Cognition Arousal/Alertness: Awake/alert Behavior During Therapy: WFL for tasks assessed/performed Overall Cognitive Status: Within Functional  Limits for tasks assessed                                        General Comments      Exercises     Assessment/Plan    PT Assessment Patient needs continued PT services  PT Problem List Decreased strength;Decreased range of motion;Decreased activity tolerance;Decreased balance;Decreased mobility;Decreased coordination;Decreased knowledge of use of  DME       PT Treatment Interventions DME instruction;Gait training;Stair training;Functional mobility training;Therapeutic activities;Therapeutic exercise;Balance training;Patient/family education    PT Goals (Current goals can be found in the Care Plan section)  Acute Rehab PT Goals Patient Stated Goal: get better PT Goal Formulation: With patient Time For Goal Achievement: 03/04/17 Potential to Achieve Goals: Good    Frequency Min 3X/week   Barriers to discharge        Co-evaluation               AM-PAC PT "6 Clicks" Daily Activity  Outcome Measure Difficulty turning over in bed (including adjusting bedclothes, sheets and blankets)?: A Little Difficulty moving from lying on back to sitting on the side of the bed? : A Little Difficulty sitting down on and standing up from a chair with arms (e.g., wheelchair, bedside commode, etc,.)?: A Little Help needed moving to and from a bed to chair (including a wheelchair)?: A Little Help needed walking in hospital room?: A Little Help needed climbing 3-5 steps with a railing? : A Lot 6 Click Score: 17    End of Session Equipment Utilized During Treatment: Gait belt Activity Tolerance: Patient tolerated treatment well Patient left: in chair;with call bell/phone within reach;with chair alarm set Nurse Communication: Mobility status PT Visit Diagnosis: Muscle weakness (generalized) (M62.81);Other abnormalities of gait and mobility (R26.89)    Time: 8251-8984 PT Time Calculation (min) (ACUTE ONLY): 35 min   Charges:   PT Evaluation $PT Eval Low Complexity: 1 Procedure PT Treatments $Gait Training: 8-22 mins   PT G Codes:        [name], PT  Acute Rehabilitation Services Pager [phone] Office [phone]   [name] 02/18/2017, 11:11 AM

## 2017-02-18 NOTE — H&P (Signed)
History and Physical    Gerald Holt IRC:[id number] DOB: 02-05-1924 DOA: 02/17/2017  PCP: [name], PA-C  Patient coming from: Home.  Chief Complaint: Weakness nausea vomiting.  HPI: Gerald Holt is a 83 y.o. male with history of paroxysmal atrial fibrillation, diabetes mellitus, hyperlipidemia, hypothyroidism and uses self cath for urinary retention presents to the ER after patient had one episode of nausea vomiting following which patient was feeling weak and tired. Denies any fever chills chest pain or shortness of breath.   ED Course: In the ER patient was afebrile. Patient was given fluid bolus and UA shows features consistent with UTI. Patient was started on antibiotics and admitted for further observation.  Review of Systems: As per HPI, rest all negative.   Past Medical History:  Diagnosis Date  . A-fib ([place])   . Cataracts, bilateral   . Chronic dermatitis   . Diabetes type 2, controlled ([place]) 2000  . History of chicken pox   . Mumps    Adult  . Retinopathy   . Shingles   . Stroke [facility]) Nov 2011  . Undescended testicle, unilateral    Right  . Whooping cough     Past Surgical History:  Procedure Laterality Date  . CARDIAC VALVE REPLACEMENT    . PRE-MALIGNANT / BENIGN SKIN LESION EXCISION    . TESTICLE SURGERY     Right, undescended  . TOOTH EXTRACTION       reports that he has quit smoking. He has never used smokeless tobacco. He reports that he does not drink alcohol or use drugs.  Allergies  Allergen Reactions  . Flomax [Tamsulosin Hcl] Other (See Comments)    Hypotension  . Shellfish Allergy Anaphylaxis  . Aspirin-Dipyridamole Er Other (See Comments)    Headaches   . Other Other (See Comments)    Blood Thinner given in Rehab gave H/As - Not Coumadin/Headaches      Family History  Problem Relation Age of Onset  . Pneumonia Mother 21       Deceased  . GI Bleed Father 69       Deceased - Ulcers  . Diabetes Cousin   . Diabetes  Brother   . Cancer Paternal Aunt     Prior to Admission medications   Medication Sig Start Date End Date Taking? Authorizing Provider  aspirin 81 MG tablet Take 1 tablet (81 mg total) by mouth daily. 06/22/15  Yes Jerline Pain, MD  Calcium Carbonate Antacid (ALKA-SELTZER ANTACID PO) Take 2 tablets by mouth daily as needed (for indegestion).    Yes [provider]  cyanocobalamin (,VITAMIN B-12,) 1000 MCG/ML injection Inject 1000 mcg intramuscular daily for 7 days, then once a week for 4 weeks, then once a month for a year Patient taking differently: Inject 1,000 mcg into the muscle every 30 (thirty) days.  04/02/16  Yes Jaffe, Adam R, DO  docusate sodium (COLACE) 100 MG capsule Take 200 mg by mouth daily as needed for mild constipation.    Yes [provider]  Eyelid Cleansers (STERILID EX) Apply 1 application topically See admin instructions. Eye Wash: Once Daily    Yes [provider]  glucose blood test strip One Touch Verio strips Use as instructed to check fasting sugars once daily .Dx:E11.9 11/20/16  Yes [name], PA-C  levothyroxine (SYNTHROID, LEVOTHROID) 50 MCG tablet Take 1 tablet (50 mcg total) by mouth daily. 04/09/16  Yes [name], PA-C  lisinopril (PRINIVIL,ZESTRIL) 2.5 MG tablet Take 1  tablet by mouth daily. 12/02/16  Yes [provider]  lovastatin (MEVACOR) 10 MG tablet Take 5 mg by mouth at bedtime.  08/21/16  Yes [provider]  metFORMIN (GLUCOPHAGE-XR) 500 MG 24 hr tablet Take 2 tablets with breakfast and at bedtime Patient taking differently: Take 1,000 mg by mouth 2 (two) times daily.  08/21/16  Yes [name], PA-C  metoprolol tartrate (LOPRESSOR) 25 MG tablet Take 0.5 tablets (12.5 mg total) by mouth 2 (two) times daily. 04/09/16  Yes [name], PA-C  Miconazole Nitrate (TRIPLE PASTE AF) 2 % OINT Apply 1 application topically daily as needed (mouth care).    Yes [provider]  Omega-3  Fatty Acids (SB OMEGA-3 FISH OIL) 1000 MG CAPS Take 1,000 mg by mouth 2 (two) times daily. Reported on 10/06/2015   Yes [provider]  OVER THE COUNTER MEDICATION Take 1 capsule by mouth 2 (two) times daily. OTC Focus Select   Yes [provider]  triamcinolone cream (KENALOG) 0.1 % Apply 1 application topically daily as needed (for itching).  02/01/16  Yes [provider]    Physical Exam: Vitals:   02/18/17 0030 02/18/17 0100 02/18/17 0130 02/18/17 0200  BP: (!) 141/63 125/67 132/65 122/66  Pulse: (!) 105 100 91 (!) 105  Resp: (!) 24 (!) 24 15 19   Temp:      TempSrc:      SpO2: 96% 97% 100% 98%  Weight:      Height:          Constitutional: Moderately built and nourished. Vitals:   02/18/17 0030 02/18/17 0100 02/18/17 0130 02/18/17 0200  BP: (!) 141/63 125/67 132/65 122/66  Pulse: (!) 105 100 91 (!) 105  Resp: (!) 24 (!) 24 15 19   Temp:      TempSrc:      SpO2: 96% 97% 100% 98%  Weight:      Height:       Eyes: Anicteric. No pallor. ENMT: No discharge from the ears eyes nose and mouth. Neck: No mass felt. No neck rigidity. Respiratory: No rhonchi or crepitations. Cardiovascular: S1 and S2 heard no murmurs appreciated. Abdomen: Soft nontender bowel sounds present. Musculoskeletal: No edema. Joint effusion. Skin: No rash. Skin appears warm. Neurologic: Alert awake oriented to time place and person. Moves all extremities. Psychiatric: Appears normal. Normal affect.   Labs on Admission: I have personally reviewed following labs and imaging studies  CBC:  Recent Labs Lab 02/17/17 2343  WBC 19.8*  NEUTROABS 16.6*  HGB 12.6*  HCT 37.5*  MCV 83.9  PLT 220   Basic Metabolic Panel:  Recent Labs Lab 02/17/17 2343  NA 133*  K 4.6  CL 97*  CO2 22  GLUCOSE 196*  BUN 19  CREATININE 1.03  CALCIUM 9.5   GFR: Estimated Creatinine Clearance: 42.2 mL/min (by C-G formula based on SCr of 1.03 mg/dL). Liver Function Tests:  Recent  Labs Lab 02/17/17 2343  AST 23  ALT 17  ALKPHOS 45  BILITOT 0.7  PROT 6.6  ALBUMIN 3.5    Recent Labs Lab 02/17/17 2343  LIPASE 30   No results for input(s): AMMONIA in the last 168 hours. Coagulation Profile: No results for input(s): INR, PROTIME in the last 168 hours. Cardiac Enzymes:  Recent Labs Lab 02/17/17 2343  TROPONINI <0.03   BNP (last 3 results) No results for input(s): PROBNP in the last 8760 hours. HbA1C: No results for input(s): HGBA1C in the last 72 hours. CBG:  No results for input(s): GLUCAP in the last 168 hours. Lipid Profile: No results for input(s): CHOL, HDL, LDLCALC, TRIG, CHOLHDL, LDLDIRECT in the last 72 hours. Thyroid Function Tests: No results for input(s): TSH, T4TOTAL, FREET4, T3FREE, THYROIDAB in the last 72 hours. Anemia Panel: No results for input(s): VITAMINB12, FOLATE, FERRITIN, TIBC, IRON, RETICCTPCT in the last 72 hours. Urine analysis:    Component Value Date/Time   COLORURINE YELLOW 02/18/2017 0000   APPEARANCEUR CLEAR 02/18/2017 0000   LABSPEC 1.010 02/18/2017 0000   PHURINE 6.0 02/18/2017 0000   GLUCOSEU NEGATIVE 02/18/2017 0000   GLUCOSEU NEGATIVE 10/17/2016 1533   HGBUR NEGATIVE 02/18/2017 0000   BILIRUBINUR NEGATIVE 02/18/2017 0000   BILIRUBINUR negative 01/22/2017 1147   KETONESUR 5 (A) 02/18/2017 0000   PROTEINUR NEGATIVE 02/18/2017 0000   UROBILINOGEN 0.2 01/22/2017 1147   UROBILINOGEN 0.2 10/17/2016 1533   NITRITE NEGATIVE 02/18/2017 0000   LEUKOCYTESUR MODERATE (A) 02/18/2017 0000   Sepsis Labs: @LABRCNTIP (procalcitonin:4,lacticidven:4) )No results found for this or any previous visit (from the past 240 hour(s)).   Radiological Exams on Admission: Dg Chest Port 1 View  Result Date: 02/18/2017 CLINICAL DATA:  83 year old male with weakness. History of diabetes. EXAM: PORTABLE CHEST 1 VIEW COMPARISON:  Chest radiograph dated 12/27/2016 FINDINGS: There is shallow inspiration with bibasilar atelectatic changes.  No focal consolidation, pleural effusion, or pneumothorax. The cardiac silhouette appears stable in similar to prior study. There is atherosclerotic calcification of the aortic arch. No acute osseous pathology. IMPRESSION: No active disease. Electronically Signed   By: Anner Crete M.D.   On: 02/18/2017 00:15    EKG: Independently reviewed. Normal sinus rhythm with RBBB.  Assessment/Plan Principal Problem:   Weakness generalized Active Problems:   Diabetes mellitus type II, uncontrolled (HCC)   Paroxysmal atrial fibrillation (HCC)   Hypothyroidism   Intermittent self-catheterization of bladder   Acute lower UTI   Nausea & vomiting   Weakness    1. Generalized weakness - probably secondary to dehydration and UTI. Will gently hydrate and keep patient on antibiotics. Get physical therapy consult. Check TSH CK and troponin. 2. Recurrent UTI with history of self cath - patient on ceftriaxone. Follow urine cultures. 3. Diabetes mellitus type 2 - hold metformin while inpatient and keep patient on sliding scale coverage. 4. Hypothyroidism on Synthroid. Check TSH. 5. Paroxysmal atrial fibrillation - on aspirin and metoprolol. Chads 2 vasc score is 6. Patient not on anticoagulation at patient's choice.   DVT prophylaxis: Lovenox. Code Status: DO NOT RESUSCITATE.  Family Communication: Discussed with patient.  Disposition Plan: Home.  Consults called: None.  Admission status: Observation.    Rise Patience MD [facility] Pager [phone].  If 7PM-7AM, please contact night-coverage [email or web address] Password [facility]  02/18/2017, 2:33 AM

## 2017-02-18 NOTE — Progress Notes (Signed)
Pt admitted by Dr Hal Hope earlier this am, please his note for detailed H&P.   Gerald Holt is a 83 y.o. male with history of paroxysmal atrial fibrillation, diabetes mellitus, hyperlipidemia, hypothyroidism and uses self cath for urinary retention presents to the ER after patient had one episode of nausea vomiting and weakness.  He was found to have abnormal UA and admitted for recurrent UTI'S secondary to urinary retention. A foley was placed today.   Plan:  Continue with IV fluids, IV antibiotics.  PT eval.  Pt's wife, requesting ID consult to see if he needs to be on maintenance antibiotics in view of his recurren uti's. He sees a urology and ID with [facility] who have recommended against it, but wife wants a second opinion.    [name], MD [phone]

## 2017-02-18 NOTE — Telephone Encounter (Signed)
Gerald Holt with [facility] calling to request verbal orders for urological supplies.

## 2017-02-18 NOTE — Telephone Encounter (Signed)
Catheter -- Coude Tip Fr 14. (pre-lubricated if possible). Use one catheter as directed up to 5 x day as directed. Quantity -- 150 with 5 refills.   Lubricant -- individual sterile packets (only if pre-lubricated cath is not giving) Use as directed with cathing.  Quantity -- 150 with 5 refills  Tipton Castile Soap Towelette Use as directed up to 5 times daily.  Quantity -- 150 with 5 refills.   Dx: Urinary retention, Enlarged Prostate  These scripts are 30-day supplies with refill. If they require 90-day at a time, ok to change to 90 day supply with 1 refill.

## 2017-02-19 DIAGNOSIS — E1165 Type 2 diabetes mellitus with hyperglycemia: Secondary | ICD-10-CM

## 2017-02-19 LAB — GLUCOSE, CAPILLARY
GLUCOSE-CAPILLARY: 171 mg/dL — AB (ref 65–99)
GLUCOSE-CAPILLARY: 151 mg/dL — AB (ref 65–99)
GLUCOSE-CAPILLARY: 157 mg/dL — AB (ref 65–99)
Glucose-Capillary: 187 mg/dL — ABNORMAL HIGH (ref 65–99)
Glucose-Capillary: 164 mg/dL — ABNORMAL HIGH (ref 65–99)

## 2017-02-19 MED ORDER — POLYETHYLENE GLYCOL 3350 17 G PO PACK
17.0000 g | PACK | Freq: Every day | ORAL | Status: DC
  Administered 2017-02-19 – 2017-02-20 (×2): 17 g via ORAL
  Filled 2017-02-19 (×2): qty 1

## 2017-02-19 NOTE — Progress Notes (Signed)
PROGRESS NOTE    Gerald Holt  WUJ:[id number] DOB: Feb 15, 1924 DOA: 02/17/2017 PCP: [name], PA-C  Brief Narrative:Gerald Williamsis a 83 y.o.malewith history of paroxysmal atrial fibrillation, diabetes mellitus, hyperlipidemia, hypothyroidism and uses self cath for urinary retention presented to the ER after patient had one episode of nausea vomiting and weakness.  He was found to have abnormal UA and admitted for recurrent UTI'S secondary to urinary retention. A foley was placed   Assessment & Plan:   1. Suspected UTI -improving on ceftriaxone -h/o BPH requiring self catheterizations -discussed with patient that prophylactic Abx of limited role here and risks likely outweigh benefits -FU Urine Cx -CBC in am -TSH, CK -ok  2. BPH -self cath BID per spouse -FU with Urology -now with foley  3. Diabetes mellitus type II, uncontrolled ([place]) -holding metformin, SSI  4. Paroxysmal atrial fibrillation (HCC) -on aspirin and metoprolol. Chads 2 vasc score is 6.  -declined anticoagulation per chart review  5. Hypothyroidism -continue synthroid, TSH ok  DVT prophylaxis:lovenox Code Status: DNR Family Communication: aide at bedside Disposition Plan: home with family in 1-2days  Consultants:   ID Dr.Snider  Antimicrobials:  Ceftriaxone  Subjective: Feels much better, back to baseline per aide  Objective: Vitals:   02/18/17 1300 02/18/17 2056 02/19/17 0530 02/19/17 0937  BP:  (!) 107/59 (!) 122/53 121/83  Pulse:  73 69   Resp:  18 18 18   Temp: 98 F (36.7 C) 98.9 F (37.2 C) 97.8 F (36.6 C) 97.6 F (36.4 C)  TempSrc:  Oral Oral Oral  SpO2:  96% 95% 93%  Weight:  70.1 kg (154 lb 9.6 oz)    Height:        Intake/Output Summary (Last 24 hours) at 02/19/17 1413 Last data filed at 02/19/17 0933  Gross per 24 hour  Intake             1910 ml  Output             2325 ml  Net             -415 ml   Filed Weights   02/17/17 2322 02/18/17 2056  Weight:  72.6 kg (160 lb) 70.1 kg (154 lb 9.6 oz)    Examination:  General exam: Appears calm and comfortable, elderly male, no distress Respiratory system: Clear to auscultation. Respiratory effort normal. Cardiovascular system: S1 & S2 heard, RRR. No JVD, murmurs Gastrointestinal system: Abdomen is nondistended, soft and nontender.Normal bowel sounds heard. Central nervous system: Alert and oriented. No focal neurological deficits. Extremities: Symmetric 5 x 5 power. Skin: No rashes, lesions or ulcers Psychiatry: Judgement and insight appear normal. Mood & affect appropriate.     Data Reviewed:   CBC:  Recent Labs Lab 02/17/17 2343 02/18/17 0447  WBC 19.8* 16.9*  NEUTROABS 16.6*  --   HGB 12.6* 11.7*  HCT 37.5* 34.9*  MCV 83.9 84.5  PLT 227 956   Basic Metabolic Panel:  Recent Labs Lab 02/17/17 2343 02/18/17 0447  NA 133*  --   K 4.6  --   CL 97*  --   CO2 22  --   GLUCOSE 196*  --   BUN 19  --   CREATININE 1.03 1.04  CALCIUM 9.5  --    GFR: Estimated Creatinine Clearance: 41.7 mL/min (by C-G formula based on SCr of 1.04 mg/dL). Liver Function Tests:  Recent Labs Lab 02/17/17 2343  AST 23  ALT 17  ALKPHOS 45  BILITOT 0.7  PROT 6.6  ALBUMIN 3.5    Recent Labs Lab 02/17/17 2343  LIPASE 30   No results for input(s): AMMONIA in the last 168 hours. Coagulation Profile: No results for input(s): INR, PROTIME in the last 168 hours. Cardiac Enzymes:  Recent Labs Lab 02/17/17 2343 02/18/17 0447  CKTOTAL  --  11*  TROPONINI <0.03 <0.03   BNP (last 3 results) No results for input(s): PROBNP in the last 8760 hours. HbA1C: No results for input(s): HGBA1C in the last 72 hours. CBG:  Recent Labs Lab 02/18/17 1141 02/18/17 1719 02/18/17 2059 02/19/17 0747 02/19/17 1236  GLUCAP 226* 158* 198* 171* 187*   Lipid Profile: No results for input(s): CHOL, HDL, LDLCALC, TRIG, CHOLHDL, LDLDIRECT in the last 72 hours. Thyroid Function Tests:  Recent  Labs  02/18/17 0447  TSH 1.197   Anemia Panel: No results for input(s): VITAMINB12, FOLATE, FERRITIN, TIBC, IRON, RETICCTPCT in the last 72 hours. Urine analysis:    Component Value Date/Time   COLORURINE YELLOW 02/18/2017 0000   APPEARANCEUR CLEAR 02/18/2017 0000   LABSPEC 1.010 02/18/2017 0000   PHURINE 6.0 02/18/2017 0000   GLUCOSEU NEGATIVE 02/18/2017 0000   GLUCOSEU NEGATIVE 10/17/2016 1533   HGBUR NEGATIVE 02/18/2017 0000   BILIRUBINUR NEGATIVE 02/18/2017 0000   BILIRUBINUR negative 01/22/2017 1147   KETONESUR 5 (A) 02/18/2017 0000   PROTEINUR NEGATIVE 02/18/2017 0000   UROBILINOGEN 0.2 01/22/2017 1147   UROBILINOGEN 0.2 10/17/2016 1533   NITRITE NEGATIVE 02/18/2017 0000   LEUKOCYTESUR MODERATE (A) 02/18/2017 0000   Sepsis Labs: @LABRCNTIP (procalcitonin:4,lacticidven:4)  )No results found for this or any previous visit (from the past 240 hour(s)).       Radiology Studies: Dg Chest Port 1 View  Result Date: 02/18/2017 CLINICAL DATA:  83 year old male with weakness. History of diabetes. EXAM: PORTABLE CHEST 1 VIEW COMPARISON:  Chest radiograph dated 12/27/2016 FINDINGS: There is shallow inspiration with bibasilar atelectatic changes. No focal consolidation, pleural effusion, or pneumothorax. The cardiac silhouette appears stable in similar to prior study. There is atherosclerotic calcification of the aortic arch. No acute osseous pathology. IMPRESSION: No active disease. Electronically Signed   By: Anner Crete M.D.   On: 02/18/2017 00:15        Scheduled Meds: . aspirin EC  81 mg Oral Daily  . enoxaparin (LOVENOX) injection  40 mg Subcutaneous Q24H  . insulin aspart  0-9 Units Subcutaneous TID WC  . levothyroxine  50 mcg Oral QAC breakfast  . lisinopril  2.5 mg Oral Daily  . metoprolol tartrate  12.5 mg Oral BID  . pravastatin  10 mg Oral q1800   Continuous Infusions: . cefTRIAXone (ROCEPHIN) IVPB 1 gram/50 mL D5W Stopped (02/18/17 2135)     LOS: 1  day    Time spent: 20min    Domenic Polite, MD [facility] Pager [phone]  If 7PM-7AM, please contact night-coverage [email or web address] Password [email or web address] 02/19/2017, 2:13 PM

## 2017-02-19 NOTE — Consult Note (Signed)
[facility]  Total days of antibiotics 3 of ceftriaxone               Reason for Consult: need for chronic suppression for recurrent uti    Referring Physician: Broadus John  Principal Problem:   Weakness generalized Active Problems:   Diabetes mellitus type II, uncontrolled (HCC)   Paroxysmal atrial fibrillation (HCC)   Hypothyroidism   Intermittent self-catheterization of bladder   Acute lower UTI   Nausea & vomiting   Weakness    HPI: Gerald Holt is a 83 y.o. male with pAfib, DM, HLD, Hypothyroidism and BPH requiring self catherization, hx of recurrent uti/but also asymptomatic bacturia is admitted for feeling poorly and episode of vomiting which brought him into the ED for evaluation and found to have leukocytosis of 19k. He had evidence of pyruia but his ur cx showing only 60,000 CFU of enterococcus. He was initially started on ceftriaoxne for history of pan sensitive ecoli uti in the past. He appers that he is getting better. Family asking if he would be candidate for chronic suppression.  Past Medical History:  Diagnosis Date  . A-fib ([place])   . Cataracts, bilateral   . Chronic dermatitis   . Diabetes type 2, controlled ([facility]) 2000  . History of chicken pox   . Mumps    Adult  . Retinopathy   . Shingles   . Stroke [facility]) Nov 2011  . Undescended testicle, unilateral    Right  . Whooping cough     Allergies:  Allergies  Allergen Reactions  . Flomax [Tamsulosin Hcl] Other (See Comments)    Hypotension  . Shellfish Allergy Anaphylaxis  . Aspirin-Dipyridamole Er Other (See Comments)    Headaches   . Other Other (See Comments)    Blood Thinner given in Rehab gave H/As - Not Coumadin/Headaches      Current antibiotics:   MEDICATIONS: . aspirin EC  81 mg Oral Daily  . enoxaparin (LOVENOX) injection  40 mg Subcutaneous Q24H  . insulin aspart  0-9 Units Subcutaneous TID WC  . levothyroxine  50 mcg Oral QAC breakfast  . lisinopril  2.5  mg Oral Daily  . metoprolol tartrate  12.5 mg Oral BID  . polyethylene glycol  17 g Oral Daily  . pravastatin  10 mg Oral q1800    Social History  Substance Use Topics  . Smoking status: Former Research scientist (life sciences)  . Smokeless tobacco: Never Used     Comment: Hasn't smoked since age 89  . Alcohol use No    Family History  Problem Relation Age of Onset  . Pneumonia Mother 76       Deceased  . GI Bleed Father 85       Deceased - Ulcers  . Diabetes Cousin   . Diabetes Brother   . Cancer Paternal Aunt     Review of Systems -   Constitutional: Negative for fever, chills, diaphoresis, activity change, appetite change, fatigue and unexpected weight change.  HENT: Negative for congestion, sore throat, rhinorrhea, sneezing, trouble swallowing and sinus pressure.  Eyes: Negative for photophobia and visual disturbance.  Respiratory: Negative for cough, chest tightness, shortness of breath, wheezing and stridor.  Cardiovascular: Negative for chest pain, palpitations and leg swelling.  Gastrointestinal: Negative for nausea, vomiting, abdominal pain, diarrhea, constipation, blood in stool, abdominal distention and anal bleeding.  Genitourinary: Negative for dysuria, hematuria, flank pain and difficulty urinating.  Musculoskeletal: Negative for myalgias, back pain, joint swelling, arthralgias and  gait problem.  Skin: Negative for color change, pallor, rash and wound.  Neurological: Negative for dizziness, tremors, weakness and light-headedness.  Hematological: Negative for adenopathy. Does not bruise/bleed easily.  Psychiatric/Behavioral: Negative for behavioral problems, confusion, sleep disturbance, dysphoric mood, decreased concentration and agitation.      OBJECTIVE: Temp:  [97.6 F (36.4 C)-98.9 F (37.2 C)] 97.6 F (36.4 C) (05/30 0937) Pulse Rate:  [69-73] 69 (05/30 0530) Resp:  [18] 18 (05/30 0937) BP: (107-122)/(53-83) 121/83 (05/30 0937) SpO2:  [93 %-96 %] 93 % (05/30 0937) Weight:   [154 lb 9.6 oz (70.1 kg)] 154 lb 9.6 oz (70.1 kg) (05/29 2056) Physical Exam  Constitutional: He is oriented to person, place, and time. He appears well-developed and well-nourished. No distress.  HENT:  Mouth/Throat: Oropharynx is clear and moist. No oropharyngeal exudate.  Cardiovascular: irreg, irreg with +SEM Pulmonary/Chest: Effort normal and breath sounds normal. No respiratory distress. He has no wheezes.  Abdominal: Soft. Bowel sounds are normal. He exhibits no distension. There is no tenderness.  Lymphadenopathy:  He has no cervical adenopathy.  Neurological: He is alert and oriented to person, place, and time.  Skin: Skin is warm and dry. No rash noted. No erythema.  Psychiatric: He has a normal mood and affect. His behavior is normal.     LABS: Results for orders placed or performed during the hospital encounter of 02/17/17 (from the past 48 hour(s))  Comprehensive metabolic panel     Status: Abnormal   Collection Time: 02/17/17 11:43 PM  Result Value Ref Range   Sodium 133 (L) 135 - 145 mmol/L   Potassium 4.6 3.5 - 5.1 mmol/L   Chloride 97 (L) 101 - 111 mmol/L   CO2 22 22 - 32 mmol/L   Glucose, Bld 196 (H) 65 - 99 mg/dL   BUN 19 6 - 20 mg/dL   Creatinine, Ser 1.03 0.61 - 1.24 mg/dL   Calcium 9.5 8.9 - 10.3 mg/dL   Total Protein 6.6 6.5 - 8.1 g/dL   Albumin 3.5 3.5 - 5.0 g/dL   AST 23 15 - 41 U/L   ALT 17 17 - 63 U/L   Alkaline Phosphatase 45 38 - 126 U/L   Total Bilirubin 0.7 0.3 - 1.2 mg/dL   GFR calc non Af Amer >60 >60 mL/min   GFR calc Af Amer >60 >60 mL/min    Comment: (NOTE) The eGFR has been calculated using the CKD EPI equation. This calculation has not been validated in all clinical situations. eGFR's persistently <60 mL/min signify possible Chronic Kidney Disease.    Anion gap 14 5 - 15  Lipase, blood     Status: None   Collection Time: 02/17/17 11:43 PM  Result Value Ref Range   Lipase 30 11 - 51 U/L  Troponin I     Status: None   Collection  Time: 02/17/17 11:43 PM  Result Value Ref Range   Troponin I <0.03 <0.03 ng/mL  CBC with Differential     Status: Abnormal   Collection Time: 02/17/17 11:43 PM  Result Value Ref Range   WBC 19.8 (H) 4.0 - 10.5 K/uL   RBC 4.47 4.22 - 5.81 MIL/uL   Hemoglobin 12.6 (L) 13.0 - 17.0 g/dL   HCT 37.5 (L) 39.0 - 52.0 %   MCV 83.9 78.0 - 100.0 fL   MCH 28.2 26.0 - 34.0 pg   MCHC 33.6 30.0 - 36.0 g/dL   RDW 14.2 11.5 - 15.5 %   Platelets 227 150 -  400 K/uL   Neutrophils Relative % 84 %   Neutro Abs 16.6 (H) 1.7 - 7.7 K/uL   Lymphocytes Relative 5 %   Lymphs Abs 1.1 0.7 - 4.0 K/uL   Monocytes Relative 7 %   Monocytes Absolute 1.4 (H) 0.1 - 1.0 K/uL   Eosinophils Relative 4 %   Eosinophils Absolute 0.7 0.0 - 0.7 K/uL   Basophils Relative 0 %   Basophils Absolute 0.0 0.0 - 0.1 K/uL  Urinalysis, Routine w reflex microscopic     Status: Abnormal   Collection Time: 02/18/17 12:00 AM  Result Value Ref Range   Color, Urine YELLOW YELLOW   APPearance CLEAR CLEAR   Specific Gravity, Urine 1.010 1.005 - 1.030   pH 6.0 5.0 - 8.0   Glucose, UA NEGATIVE NEGATIVE mg/dL   Hgb urine dipstick NEGATIVE NEGATIVE   Bilirubin Urine NEGATIVE NEGATIVE   Ketones, ur 5 (A) NEGATIVE mg/dL   Protein, ur NEGATIVE NEGATIVE mg/dL   Nitrite NEGATIVE NEGATIVE   Leukocytes, UA MODERATE (A) NEGATIVE   RBC / HPF 0-5 0 - 5 RBC/hpf   WBC, UA TOO NUMEROUS TO COUNT 0 - 5 WBC/hpf   Bacteria, UA NONE SEEN NONE SEEN   Squamous Epithelial / LPF NONE SEEN NONE SEEN  Culture, Urine     Status: Abnormal (Preliminary result)   Collection Time: 02/18/17 12:00 AM  Result Value Ref Range   Specimen Description URINE, RANDOM    Special Requests NONE    Culture (A)     60,000 COLONIES/mL ENTEROCOCCUS FAECALIS SUSCEPTIBILITIES TO FOLLOW    Report Status PENDING   Glucose, capillary     Status: Abnormal   Collection Time: 02/18/17  4:18 AM  Result Value Ref Range   Glucose-Capillary 186 (H) 65 - 99 mg/dL  CK     Status:  Abnormal   Collection Time: 02/18/17  4:47 AM  Result Value Ref Range   Total CK 11 (L) 49 - 397 U/L  TSH     Status: None   Collection Time: 02/18/17  4:47 AM  Result Value Ref Range   TSH 1.197 0.350 - 4.500 uIU/mL    Comment: Performed by a 3rd Generation assay with a functional sensitivity of <=0.01 uIU/mL.  Troponin I     Status: None   Collection Time: 02/18/17  4:47 AM  Result Value Ref Range   Troponin I <0.03 <0.03 ng/mL  CBC     Status: Abnormal   Collection Time: 02/18/17  4:47 AM  Result Value Ref Range   WBC 16.9 (H) 4.0 - 10.5 K/uL   RBC 4.13 (L) 4.22 - 5.81 MIL/uL   Hemoglobin 11.7 (L) 13.0 - 17.0 g/dL   HCT 34.9 (L) 39.0 - 52.0 %   MCV 84.5 78.0 - 100.0 fL   MCH 28.3 26.0 - 34.0 pg   MCHC 33.5 30.0 - 36.0 g/dL   RDW 14.2 11.5 - 15.5 %   Platelets 212 150 - 400 K/uL  Creatinine, serum     Status: None   Collection Time: 02/18/17  4:47 AM  Result Value Ref Range   Creatinine, Ser 1.04 0.61 - 1.24 mg/dL   GFR calc non Af Amer >60 >60 mL/min   GFR calc Af Amer >60 >60 mL/min    Comment: (NOTE) The eGFR has been calculated using the CKD EPI equation. This calculation has not been validated in all clinical situations. eGFR's persistently <60 mL/min signify possible Chronic Kidney Disease.   Glucose, capillary  Status: Abnormal   Collection Time: 02/18/17  8:02 AM  Result Value Ref Range   Glucose-Capillary 217 (H) 65 - 99 mg/dL  Glucose, capillary     Status: Abnormal   Collection Time: 02/18/17 11:41 AM  Result Value Ref Range   Glucose-Capillary 226 (H) 65 - 99 mg/dL  Glucose, capillary     Status: Abnormal   Collection Time: 02/18/17  5:19 PM  Result Value Ref Range   Glucose-Capillary 158 (H) 65 - 99 mg/dL  Glucose, capillary     Status: Abnormal   Collection Time: 02/18/17  8:59 PM  Result Value Ref Range   Glucose-Capillary 198 (H) 65 - 99 mg/dL  Glucose, capillary     Status: Abnormal   Collection Time: 02/19/17  7:47 AM  Result Value Ref  Range   Glucose-Capillary 171 (H) 65 - 99 mg/dL  Glucose, capillary     Status: Abnormal   Collection Time: 02/19/17 12:36 PM  Result Value Ref Range   Glucose-Capillary 187 (H) 65 - 99 mg/dL  Glucose, capillary     Status: Abnormal   Collection Time: 02/19/17  5:25 PM  Result Value Ref Range   Glucose-Capillary 151 (H) 65 - 99 mg/dL    MICRO:  IMAGING: Dg Chest Port 1 View  Result Date: 02/18/2017 CLINICAL DATA:  83 year old male with weakness. History of diabetes. EXAM: PORTABLE CHEST 1 VIEW COMPARISON:  Chest radiograph dated 12/27/2016 FINDINGS: There is shallow inspiration with bibasilar atelectatic changes. No focal consolidation, pleural effusion, or pneumothorax. The cardiac silhouette appears stable in similar to prior study. There is atherosclerotic calcification of the aortic arch. No acute osseous pathology. IMPRESSION: No active disease. Electronically Signed   By: Anner Crete M.D.   On: 02/18/2017 00:15    HISTORICAL MICRO/IMAGING  Assessment/Plan:    - would not be a candidate for chronic suppression with abtx, too risky for promoting resistance as well as risk of c.difficile - he has 60K CFU of enterococcus but appears getting better. Consider stopping abtx. - would only send urine cx at home for symptomatic reasons not just when urine is cloudy. Might be more a reflection of urinary retention/dehydration Constipation = consider miralax

## 2017-02-19 NOTE — Progress Notes (Signed)
Physical Therapy Treatment Patient Details Name: Gerald Holt MRN: [id number] DOB: 13-Nov-1923 Today's Date: 02/19/2017    History of Present Illness Gerald Holt is a 83 y.o. male with history of paroxysmal atrial fibrillation, diabetes mellitus, hyperlipidemia, hypothyroidism and uses self cath for urinary retention presents to the ER after patient had one episode of nausea vomiting following which patient was feeling weak and tired. UA consistent with UTI    PT Comments    Continuing work on functional mobility and activity tolerance;  Walked with RW for more stability; Tending to keep RW too far in front; at least mod cues for posture; Able to extend trunk for fully upright posture, however sinks back into trunk flexed posture within 30 seconds, indicative of postural muscle fatigue   Follow Up Recommendations  Home health PT;Supervision/Assistance - 24 hour     Equipment Recommendations  3in1 (PT)    Recommendations for Other Services       Precautions / Restrictions Precautions Precautions: Fall    Mobility  Bed Mobility Overal bed mobility: Needs Assistance Bed Mobility: Supine to Sit     Supine to sit: Min guard     General bed mobility comments: [place]inguard for safety  Transfers Overall transfer level: Needs assistance Equipment used: Rolling walker (2 wheeled) Transfers: Sit to/from Stand Sit to Stand: Min guard         General transfer comment: Cues for hand placement  Ambulation/Gait Ambulation/Gait assistance: Min guard;Min assist Ambulation Distance (Feet): 120 Feet Assistive device: Rolling walker (2 wheeled) Gait Pattern/deviations: Decreased step length - right;Decreased step length - left;Decreased stride length;Trunk flexed     General Gait Details: Cues to self-monitor for activity tolerance; Cues and min assist for RW proximity and posture   Stairs            Wheelchair Mobility    Modified Rankin (Stroke Patients Only)       Balance             Standing balance-Leahy Scale: Poor                              Cognition Arousal/Alertness: Awake/alert Behavior During Therapy: WFL for tasks assessed/performed Overall Cognitive Status: Within Functional Limits for tasks assessed                                        Exercises      General Comments        Pertinent Vitals/Pain Pain Assessment: No/denies pain    Home Living                      Prior Function            PT Goals (current goals can now be found in the care plan section) Acute Rehab PT Goals Patient Stated Goal: get better PT Goal Formulation: With patient Time For Goal Achievement: 03/04/17 Potential to Achieve Goals: Good Progress towards PT goals: Progressing toward goals    Frequency    Min 3X/week      PT Plan Current plan remains appropriate    Co-evaluation              AM-PAC PT "6 Clicks" Daily Activity  Outcome Measure  Difficulty turning over in bed (including adjusting bedclothes, sheets and blankets)?: A Little Difficulty moving from lying  on back to sitting on the side of the bed? : A Little Difficulty sitting down on and standing up from a chair with arms (e.g., wheelchair, bedside commode, etc,.)?: A Little Help needed moving to and from a bed to chair (including a wheelchair)?: A Little Help needed walking in hospital room?: A Little Help needed climbing 3-5 steps with a railing? : A Little 6 Click Score: 18    End of Session Equipment Utilized During Treatment: Gait belt Activity Tolerance: Patient tolerated treatment well Patient left: in chair;with call bell/phone within reach;with chair alarm set Nurse Communication: Mobility status PT Visit Diagnosis: Muscle weakness (generalized) (M62.81);Other abnormalities of gait and mobility (R26.89)     Time: 9977-4142 PT Time Calculation (min) (ACUTE ONLY): 27 min  Charges:  $Gait Training: 23-37  mins                    G Codes:       [name], PT  Acute Rehabilitation Services Pager [phone] Office [place] 02/19/2017, 3:17 PM

## 2017-02-20 ENCOUNTER — Ambulatory Visit: Payer: Medicare Other | Admitting: Physical Therapy

## 2017-02-20 ENCOUNTER — Encounter: Payer: Self-pay | Admitting: Physician Assistant

## 2017-02-20 DIAGNOSIS — Z789 Other specified health status: Secondary | ICD-10-CM

## 2017-02-20 LAB — BASIC METABOLIC PANEL
Anion gap: 9 (ref 5–15)
BUN: 16 mg/dL (ref 6–20)
CALCIUM: 9.2 mg/dL (ref 8.9–10.3)
CO2: 25 mmol/L (ref 22–32)
CREATININE: 0.99 mg/dL (ref 0.61–1.24)
Chloride: 102 mmol/L (ref 101–111)
GFR calc Af Amer: >60 mL/min (ref >60)
GFR calc non Af Amer: >60 mL/min (ref >60)
GLUCOSE: 181 mg/dL — AB (ref 65–99)
Potassium: 4 mmol/L (ref 3.5–5.1)
Sodium: 136 mmol/L (ref 135–145)

## 2017-02-20 LAB — CBC
HEMATOCRIT: 33.5 % — AB (ref 39.0–52.0)
Hemoglobin: 11.2 g/dL — ABNORMAL LOW (ref 13.0–17.0)
MCH: 27.9 pg (ref 26.0–34.0)
MCHC: 33.4 g/dL (ref 30.0–36.0)
MCV: 83.3 fL (ref 78.0–100.0)
Platelets: 195 10*3/uL (ref 150–400)
RBC: 4.02 MIL/uL — ABNORMAL LOW (ref 4.22–5.81)
RDW: 14.2 % (ref 11.5–15.5)
WBC: 9 10*3/uL (ref 4.0–10.5)

## 2017-02-20 LAB — URINE CULTURE: Culture: 60000 — AB

## 2017-02-20 LAB — GLUCOSE, CAPILLARY
Glucose-Capillary: 175 mg/dL — ABNORMAL HIGH (ref 65–99)
Glucose-Capillary: 182 mg/dL — ABNORMAL HIGH (ref 65–99)
Glucose-Capillary: 273 mg/dL — ABNORMAL HIGH (ref 65–99)

## 2017-02-20 NOTE — Progress Notes (Signed)
Physical Therapy Treatment Patient Details Name: Gerald Holt MRN: [id number] DOB: 1924-07-23 Today's Date: 02/20/2017    History of Present Illness Gerald Holt is a 83 y.o. male with history of paroxysmal atrial fibrillation, diabetes mellitus, hyperlipidemia, hypothyroidism and uses self cath for urinary retention presents to the ER after patient had one episode of nausea vomiting following which patient was feeling weak and tired. UA consistent with UTI    PT Comments    Pt presents with improved tolerance for bed mobs and transfers this session with decreased assistance required. Pt is able to increased gait distance and has improved proximity to RW. Pt will have 24 hr caregivers once he returns home.     Follow Up Recommendations  Home health PT;Supervision/Assistance - 24 hour     Equipment Recommendations  3in1 (PT)    Recommendations for Other Services OT consult     Precautions / Restrictions Precautions Precautions: Fall Restrictions Weight Bearing Restrictions: No    Mobility  Bed Mobility Overal bed mobility: Needs Assistance Bed Mobility: Supine to Sit     Supine to sit: Min guard     General bed mobility comments: min guard to EOB  Transfers Overall transfer level: Needs assistance Equipment used: None Transfers: Sit to/from Stand Sit to Stand: Supervision            Ambulation/Gait Ambulation/Gait assistance: Min guard Ambulation Distance (Feet): 100 Feet Assistive device: Rolling walker (2 wheeled) Gait Pattern/deviations: Decreased step length - right;Decreased step length - left;Decreased stride length;Trunk flexed Gait velocity: decreased Gait velocity interpretation: Below normal speed for age/gender General Gait Details: Cues to self-monitor for activity tolerance; Cues and min assist for RW proximity and posture   Stairs            Wheelchair Mobility    Modified Rankin (Stroke Patients Only)       Balance Overall  balance assessment: Needs assistance Sitting-balance support: No upper extremity supported;Feet supported Sitting balance-Leahy Scale: Good     Standing balance support: No upper extremity supported Standing balance-Leahy Scale: Fair Standing balance comment: able to stand briefly without AD                            Cognition Arousal/Alertness: Awake/alert Behavior During Therapy: WFL for tasks assessed/performed Overall Cognitive Status: Within Functional Limits for tasks assessed                                        Exercises      General Comments        Pertinent Vitals/Pain Pain Assessment: No/denies pain    Home Living Family/patient expects to be discharged to:: Private residence Living Arrangements: Spouse/significant other Available Help at Discharge: Family;Personal care attendant (PCA 6 days a week (9:00 am to 6:00pm)) Type of Home: House Home Access: Stairs to enter   Home Layout: One level Home Equipment: Environmental consultant - 2 wheels;Cane - single point;Shower seat;Hand held shower head;Grab bars - tub/shower      Prior Function Level of Independence: Needs assistance  Gait / Transfers Assistance Needed: Typically walks without assistive device; uses cane on rough days ADL's / Homemaking Assistance Needed: [facility]; Wife and Aide help with catheterizing     PT Goals (current goals can now be found in the care plan section) Acute Rehab PT Goals Patient Stated Goal: home  today Progress towards PT goals: Progressing toward goals    Frequency    Min 3X/week      PT Plan Current plan remains appropriate    Co-evaluation              AM-PAC PT "6 Clicks" Daily Activity  Outcome Measure  Difficulty turning over in bed (including adjusting bedclothes, sheets and blankets)?: None Difficulty moving from lying on back to sitting on the side of the bed? : None Difficulty sitting down on and standing up from  a chair with arms (e.g., wheelchair, bedside commode, etc,.)?: A Little Help needed moving to and from a bed to chair (including a wheelchair)?: A Little Help needed walking in hospital room?: A Little Help needed climbing 3-5 steps with a railing? : A Little 6 Click Score: 20    End of Session Equipment Utilized During Treatment: Gait belt Activity Tolerance: Patient tolerated treatment well Patient left: in chair;with call bell/phone within reach;with chair alarm set Nurse Communication: Mobility status PT Visit Diagnosis: Muscle weakness (generalized) (M62.81);Other abnormalities of gait and mobility (R26.89)     Time: 3744-5146 PT Time Calculation (min) (ACUTE ONLY): 19 min  Charges:  $Gait Training: 8-22 mins                    G Codes:       Scheryl Marten PT, DPT  [phone]    Jacqulyn Liner Sloan Leiter 02/20/2017, 12:30 PM

## 2017-02-20 NOTE — Discharge Summary (Signed)
Physician Discharge Summary  Gerald Holt VOH:[id number] DOB: 16-Nov-1923 DOA: 02/17/2017  PCP: [name], PA-C  Admit date: 02/17/2017 Discharge date: 02/20/2017  Time spent: 35 minutes  Recommendations for Outpatient Follow-up:  1. PCP in 1 week 2. FU with Urology at [facility] in 66month   Discharge Diagnoses:  Principal Problem:   Weakness generalized   Suspected UTI   BPH   Diabetes mellitus type II, uncontrolled ([place])   Paroxysmal atrial fibrillation ([place])   Hypothyroidism   Intermittent self-catheterization of bladder   Nausea & vomiting   Weakness   Discharge Condition: stable  Diet recommendation: diabetic  Filed Weights   02/17/17 2322 02/18/17 2056  Weight: 72.6 kg (160 lb) 70.1 kg (154 lb 9.6 oz)    History of present illness:  Gerald Holt a 83 y.o.malewith history of paroxysmal atrial fibrillation, diabetes mellitus, hyperlipidemia, hypothyroidism and uses self cath for urinary retention presented to the ER after patient had one episode of nausea vomiting and weakness.  He was found to have abnormal UA and admitted for recurrent UTI'S secondary to urinary retention  Hospital Course:  1. Suspected UTI -improved with IVF and IV ceftriaxone back to baseline -h/o BPH requiring self catheterizations -discussed with patient and wife that prophylactic Abx of limited role here and risks-Cdiff and resistant infections will outweigh benefits -Urine Cx grew 60K colonies of enterobacter -Dr.Snider recommended stopping Abx -stopped abx and discharged home in a stable condition  2. BPH -self cath QID per spouse -FU with [facility]rology at [facility]  3. Diabetes mellitus type II, controlled ([place]) -resumed metformin at discharge  4. Paroxysmal atrial fibrillation ([facility]) -on aspirin and metoprolol. Chads 2 vasc score is 6.  -pt has declined anticoagulation per chart review  5. Hypothyroidism -continue synthroid, TSH ok  Consultations:  ID  Dr.Snider  Discharge Exam: Vitals:   02/20/17 0522 02/20/17 0957  BP: (!) 167/70 (!) 130/58  Pulse: 79 63  Resp: 17 17  Temp: 97.6 F (36.4 C) 97.5 F (36.4 C)    General: AAOx3 Cardiovascular: S1S2/RRR Respiratory: CTAB  Discharge Instructions   Discharge Instructions    Diet - low sodium heart healthy    Complete by:  As directed    Increase activity slowly    Complete by:  As directed      Current Discharge Medication List    CONTINUE these medications which have NOT CHANGED   Details  aspirin 81 MG tablet Take 1 tablet (81 mg total) by mouth daily.    Calcium Carbonate Antacid (ALKA-SELTZER ANTACID PO) Take 2 tablets by mouth daily as needed (for indegestion).     cyanocobalamin (,VITAMIN B-12,) 1000 MCG/ML injection Inject 1000 mcg intramuscular daily for 7 days, then once a week for 4 weeks, then once a month for a year Qty: 25 mL, Refills: 0   Associated Diagnoses: B12 deficiency    docusate sodium (COLACE) 100 MG capsule Take 200 mg by mouth daily as needed for mild constipation.     Eyelid Cleansers (STERILID EX) Apply 1 application topically See admin instructions. Eye Wash: Once Daily     glucose blood test strip One Touch Verio strips Use as instructed to check fasting sugars once daily .Dx:E11.9 Qty: 100 each, Refills: 12    levothyroxine (SYNTHROID, LEVOTHROID) 50 MCG tablet Take 1 tablet (50 mcg total) by mouth daily. Qty: 90 tablet, Refills: 3    lisinopril (PRINIVIL,ZESTRIL) 2.5 MG tablet Take 1 tablet by mouth daily.    lovastatin (MEVACOR) 10 MG tablet  Take 5 mg by mouth at bedtime.     metFORMIN (GLUCOPHAGE-XR) 500 MG 24 hr tablet Take 2 tablets with breakfast and at bedtime Qty: 360 tablet, Refills: 1    metoprolol tartrate (LOPRESSOR) 25 MG tablet Take 0.5 tablets (12.5 mg total) by mouth 2 (two) times daily. Qty: 180 tablet, Refills: 1    Miconazole Nitrate (TRIPLE PASTE AF) 2 % OINT Apply 1 application topically daily as needed (mouth  care).     Omega-3 Fatty Acids (SB OMEGA-3 FISH OIL) 1000 MG CAPS Take 1,000 mg by mouth 2 (two) times daily. Reported on 10/06/2015    OVER THE COUNTER MEDICATION Take 1 capsule by mouth 2 (two) times daily. OTC Focus Select    triamcinolone cream (KENALOG) 0.1 % Apply 1 application topically daily as needed (for itching).        Allergies  Allergen Reactions  . Flomax [Tamsulosin Hcl] Other (See Comments)    Hypotension  . Shellfish Allergy Anaphylaxis  . Aspirin-Dipyridamole Er Other (See Comments)    Headaches   . Other Other (See Comments)    Blood Thinner given in Rehab gave H/As - Not Coumadin/Headaches     Follow-up Information    [name], PA-C. Schedule an appointment as soon as possible for a visit in 1 week(s).   Specialty:  Family Medicine Contact information: [name] [place] [address] [phone]            The results of significant diagnostics from this hospitalization (including imaging, microbiology, ancillary and laboratory) are listed below for reference.    Significant Diagnostic Studies: Dg Chest Port 1 View  Result Date: 02/18/2017 CLINICAL DATA:  83 year old male with weakness. History of diabetes. EXAM: PORTABLE CHEST 1 VIEW COMPARISON:  Chest radiograph dated 12/27/2016 FINDINGS: There is shallow inspiration with bibasilar atelectatic changes. No focal consolidation, pleural effusion, or pneumothorax. The cardiac silhouette appears stable in similar to prior study. There is atherosclerotic calcification of the aortic arch. No acute osseous pathology. IMPRESSION: No active disease. Electronically Signed   By: Anner Crete M.D.   On: 02/18/2017 00:15    Microbiology: Recent Results (from the past 240 hour(s))  Culture, Urine     Status: Abnormal   Collection Time: 02/18/17 12:00 AM  Result Value Ref Range Status   Specimen Description URINE, RANDOM  Final   Special Requests NONE  Final   Culture 60,000  COLONIES/mL ENTEROCOCCUS FAECALIS (A)  Final   Report Status 02/20/2017 FINAL  Final   Organism ID, Bacteria ENTEROCOCCUS FAECALIS (A)  Final      Susceptibility   Enterococcus faecalis - MIC*    AMPICILLIN <=2 SENSITIVE Sensitive     LEVOFLOXACIN 1 SENSITIVE Sensitive     NITROFURANTOIN <=16 SENSITIVE Sensitive     VANCOMYCIN 1 SENSITIVE Sensitive     * 60,000 COLONIES/mL ENTEROCOCCUS FAECALIS     Labs: Basic Metabolic Panel:  Recent Labs Lab 02/17/17 2343 02/18/17 0447 02/20/17 0537  NA 133*  --  136  K 4.6  --  4.0  CL 97*  --  102  CO2 22  --  25  GLUCOSE 196*  --  181*  BUN 19  --  16  CREATININE 1.03 1.04 0.99  CALCIUM 9.5  --  9.2   Liver Function Tests:  Recent Labs Lab 02/17/17 2343  AST 23  ALT 17  ALKPHOS 45  BILITOT 0.7  PROT 6.6  ALBUMIN 3.5    Recent Labs  Lab 02/17/17 2343  LIPASE 30   No results for input(s): AMMONIA in the last 168 hours. CBC:  Recent Labs Lab 02/17/17 2343 02/18/17 0447 02/20/17 0537  WBC 19.8* 16.9* 9.0  NEUTROABS 16.6*  --   --   HGB 12.6* 11.7* 11.2*  HCT 37.5* 34.9* 33.5*  MCV 83.9 84.5 83.3  PLT 227 212 195   Cardiac Enzymes:  Recent Labs Lab 02/17/17 2343 02/18/17 0447  CKTOTAL  --  11*  TROPONINI <0.03 <0.03   BNP: BNP (last 3 results) No results for input(s): BNP in the last 8760 hours.  ProBNP (last 3 results) No results for input(s): PROBNP in the last 8760 hours.  CBG:  Recent Labs Lab 02/19/17 1725 02/19/17 2009 02/19/17 2231 02/20/17 0526 02/20/17 0755  GLUCAP 151* 157* 164* 175* 182*       SignedDomenic Polite MD.  [facility] 02/20/2017, 11:23 AM

## 2017-02-20 NOTE — Progress Notes (Signed)
Pt discharge instructions given, pt verbalized understanding.  VSS. Denies pain.  Pt left floor via wheelchair accompanied by staff and family. 

## 2017-02-20 NOTE — Evaluation (Signed)
Occupational Therapy Evaluation and Discharge Patient Details Name: Gerald Holt MRN: [id number] DOB: 1923-12-08 Today's Date: 02/20/2017    History of Present Illness Gerald Holt is a 83 y.o. male with history of paroxysmal atrial fibrillation, diabetes mellitus, hyperlipidemia, hypothyroidism and uses self cath for urinary retention presents to the ER after patient had one episode of nausea vomiting following which patient was feeling weak and tired. UA consistent with UTI   Clinical Impression   This 83 yo male admitted with above presents to acute OT at an overall S level and has this at home from wife, sister-in-law, and PCA. No further OT needs we will sign off.    Follow Up Recommendations  No OT follow up;Supervision/Assistance - 24 hour    Equipment Recommendations  None recommended by OT       Precautions / Restrictions Precautions Precautions: Fall Restrictions Weight Bearing Restrictions: No      Mobility Bed Mobility               General bed mobility comments: pt up in recliner upon my arrival  Transfers Overall transfer level: Needs assistance Equipment used: None Transfers: Sit to/from Stand Sit to Stand: Supervision                  ADL either performed or assessed with clinical judgement   ADL                                         General ADL Comments: Overall at A S level dressing with A only pull his t-shirt down in the back. S for all transfers     Vision Patient Visual Report: No change from baseline              Pertinent Vitals/Pain Pain Assessment: No/denies pain     Hand Dominance Right   Extremity/Trunk Assessment Upper Extremity Assessment Upper Extremity Assessment: Overall WFL for tasks assessed           Communication Communication Communication: HOH (has Bil Hearing aids)   Cognition Arousal/Alertness: Awake/alert Behavior During Therapy: WFL for tasks assessed/performed Overall  Cognitive Status: Within Functional Limits for tasks assessed                                                Home Living Family/patient expects to be discharged to:: Private residence Living Arrangements: Spouse/significant other Available Help at Discharge: Family;Personal care attendant (PCA 6 days a week (9:00 am to 6:00pm)) Type of Home: House Home Access: Stairs to enter [employer] of Steps: 1   Home Layout: One level     Bathroom Shower/Tub: Occupational psychologist: Standard     Home Equipment: Environmental consultant - 2 wheels;Cane - single point;Shower seat;Hand held shower head;Grab bars - tub/shower          Prior Functioning/Environment Level of Independence: Needs assistance  Gait / Transfers Assistance Needed: Typically walks without assistive device; uses cane on rough days ADL's / Homemaking Assistance Needed: Visiting [place] home service; Wife and Aide help with catheterizing            OT Problem List: Impaired balance (sitting and/or standing)         OT Goals(Current goals can be found in the  care plan section) Acute Rehab OT Goals Patient Stated Goal: home today  OT Frequency:                AM-PAC PT "6 Clicks" Daily Activity     Outcome Measure Help from another person eating meals?: None Help from another person taking care of personal grooming?: None Help from another person toileting, which includes using toliet, bedpan, or urinal?: None Help from another person bathing (including washing, rinsing, drying)?: None Help from another person to put on and taking off regular upper body clothing?: A Little Help from another person to put on and taking off regular lower body clothing?: None 6 Click Score: 23   End of Session    Activity Tolerance: Patient tolerated treatment well Patient left: in chair;with family/visitor present;with nursing/sitter in room  OT Visit Diagnosis: Unsteadiness on feet (R26.81)                 Time: 4818-5631 OT Time Calculation (min): 25 min Charges:  OT General Charges $OT Visit: 1 Procedure OT Evaluation $OT Eval Moderate Complexity: 1 Procedure OT Treatments $Self Care/Home Management : 8-22 mins [place], OTR/L [phone] 02/20/2017

## 2017-02-21 ENCOUNTER — Telehealth: Payer: Self-pay

## 2017-02-21 NOTE — Telephone Encounter (Signed)
Spoke with patients wife, Gerald Holt.    Transition Care Management Follow-up Telephone Call   Date discharged? 02/20/2017   How have you been since you were released from the hospital? "he's much better"   Do you understand why you were in the hospital? yes, "UTI".    Do you understand the discharge instructions? yes   Where were you discharged to? Home. Lives with wife.    Items Reviewed:  Medications reviewed: no, "no changes"  Allergies reviewed: No changes  Dietary changes reviewed: yes  Referrals reviewed: yes, Home Health referral.    Functional Questionnaire:   Activities of Daily Living (ADLs):   He states they are independent in the following: feeding States they require assistance with the following. Someone is with patient at all times for ADLs.    Any transportation issues/concerns?: no   Any patient concerns? no   Confirmed importance and date/time of follow-up visits scheduled yes  Provider Appointment booked with PCP on Wednesday, 03/05/2017 @ 11am.   Confirmed with patient if condition begins to worsen call PCP or go to the ER.  Patient was given the office number and encouraged to call back with question or concerns.  : yes

## 2017-02-27 ENCOUNTER — Other Ambulatory Visit: Payer: Self-pay | Admitting: Physician Assistant

## 2017-03-04 ENCOUNTER — Ambulatory Visit (INDEPENDENT_AMBULATORY_CARE_PROVIDER_SITE_OTHER): Payer: Medicare Other | Admitting: Podiatry

## 2017-03-04 ENCOUNTER — Encounter: Payer: Self-pay | Admitting: Podiatry

## 2017-03-04 DIAGNOSIS — M79674 Pain in right toe(s): Secondary | ICD-10-CM | POA: Diagnosis not present

## 2017-03-04 DIAGNOSIS — I739 Peripheral vascular disease, unspecified: Secondary | ICD-10-CM

## 2017-03-04 DIAGNOSIS — M79675 Pain in left toe(s): Secondary | ICD-10-CM | POA: Diagnosis not present

## 2017-03-04 DIAGNOSIS — B351 Tinea unguium: Secondary | ICD-10-CM

## 2017-03-04 NOTE — Progress Notes (Signed)
Subjective: 83 y.o. returns the office today for painful, elongated, thickened toenails which he cannot trim himself. Denies any redness or drainage around the nails. Denies any acute changes since last appointment and no new complaints today. Denies any systemic complaints such as fevers, chills, nausea, vomiting.   Objective: AAO 3, NAD; presents with caregier DP/PT pulses palpable 1/4, CRT less than 3 seconds Nails hypertrophic, dystrophic, elongated, brittle, discolored 10. There is incurvation along the medial and lateral hallux toenails. There is tenderness overlying the nails 1-5 bilaterally. There is no surrounding erythema or drainage along the nail sites. No open lesions or pre-ulcerative lesions are identified. No other areas of tenderness bilateral lower extremities. No overlying edema, erythema, increased warmth. No pain with calf compression, swelling, warmth, erythema.  Assessment: Patient presents with symptomatic onychomycosis  Plan: -Treatment options including alternatives, risks, complications were discussed -Nails sharply debrided 10 without complication/bleeding. -Discussed daily foot inspection. If there are any changes, to call the office immediately.  -Follow-up in 3 months or sooner if any problems are to arise. In the meantime, encouraged to call the office with any questions, concerns, changes symptoms.  Celesta Gentile, DPM

## 2017-03-05 ENCOUNTER — Ambulatory Visit (INDEPENDENT_AMBULATORY_CARE_PROVIDER_SITE_OTHER): Payer: Medicare Other | Admitting: Physician Assistant

## 2017-03-05 ENCOUNTER — Encounter: Payer: Self-pay | Admitting: Physician Assistant

## 2017-03-05 VITALS — BP 110/62 | HR 74 | Temp 97.6°F | Resp 14 | Ht 66.0 in | Wt 157.0 lb

## 2017-03-05 DIAGNOSIS — E1165 Type 2 diabetes mellitus with hyperglycemia: Principal | ICD-10-CM

## 2017-03-05 DIAGNOSIS — IMO0001 Reserved for inherently not codable concepts without codable children: Secondary | ICD-10-CM

## 2017-03-05 DIAGNOSIS — R269 Unspecified abnormalities of gait and mobility: Secondary | ICD-10-CM

## 2017-03-05 DIAGNOSIS — M6281 Muscle weakness (generalized): Secondary | ICD-10-CM

## 2017-03-05 DIAGNOSIS — N3000 Acute cystitis without hematuria: Secondary | ICD-10-CM

## 2017-03-05 DIAGNOSIS — E119 Type 2 diabetes mellitus without complications: Secondary | ICD-10-CM | POA: Diagnosis not present

## 2017-03-05 LAB — HEMOGLOBIN A1C: Hgb A1c MFr Bld: 7.2 % — ABNORMAL HIGH (ref 4.6–6.5)

## 2017-03-05 NOTE — Progress Notes (Signed)
Pre visit review using our clinic review tool, if applicable. No additional management support is needed unless otherwise documented below in the visit note. 

## 2017-03-05 NOTE — Progress Notes (Signed)
Patient presents to clinic today with his wife for hospital follow-up. Patient presented to ER on 02/17/17 with c/o weakness, nausea and vomiting. Was subsequently admitted to the hospital. Patient diagnosed with generalized weakness and suspected UTI. Was started on empiric antibiotics. ID consulted and decision to stop antibiotics was made. Patient stable during admission. Patient was subsequently discharged home in the care of his wife.  Since discharge, patient and wife note patient is doing very well. Is hydrated and eating well. Is getting plenty of rest. Deny any difficulty with catheter use. Denies cloudy, foul-smelling urine. No suprapubic, abdominal or back pain. Wife wants to discuss restarting OP physical therapy.  Past Medical History:  Diagnosis Date  . A-fib ([place])   . Cataracts, bilateral   . Chronic dermatitis   . Diabetes type 2, controlled ([place]) 2000  . History of chicken pox   . Mumps    Adult  . Retinopathy   . Shingles   . Stroke [facility]) Nov 2011  . Undescended testicle, unilateral    Right  . Whooping cough     Current Outpatient Prescriptions on File Prior to Visit  Medication Sig Dispense Refill  . aspirin 81 MG tablet Take 1 tablet (81 mg total) by mouth daily.    . Calcium Carbonate Antacid (ALKA-SELTZER ANTACID PO) Take 2 tablets by mouth daily as needed (for indegestion).     . cyanocobalamin (,VITAMIN B-12,) 1000 MCG/ML injection Inject 1000 mcg intramuscular daily for 7 days, then once a week for 4 weeks, then once a month for a year (Patient taking differently: Inject 1,000 mcg into the muscle every 30 (thirty) days. ) 25 mL 0  . docusate sodium (COLACE) 100 MG capsule Take 200 mg by mouth daily as needed for mild constipation.     . Eyelid Cleansers (STERILID EX) Apply 1 application topically See admin instructions. Eye Wash: Once Daily     . glucose blood test strip One Touch Verio strips Use as instructed to check fasting sugars once daily .Dx:E11.9 100  each 12  . levothyroxine (SYNTHROID, LEVOTHROID) 50 MCG tablet Take 1 tablet (50 mcg total) by mouth daily. 90 tablet 3  . lisinopril (PRINIVIL,ZESTRIL) 2.5 MG tablet Take 1 tablet by mouth daily.    Marland Kitchen lovastatin (MEVACOR) 10 MG tablet TAKE 1 TABLET AT BEDTIME 90 tablet 1  . metFORMIN (GLUCOPHAGE-XR) 500 MG 24 hr tablet TAKE 2 TABLETS WITH BREAKFAST AND AT BEDTIME 360 tablet 1  . metoprolol tartrate (LOPRESSOR) 25 MG tablet Take 0.5 tablets (12.5 mg total) by mouth 2 (two) times daily. 180 tablet 1  . Miconazole Nitrate (TRIPLE PASTE AF) 2 % OINT Apply 1 application topically daily as needed (mouth care).     . Omega-3 Fatty Acids (SB OMEGA-3 FISH OIL) 1000 MG CAPS Take 1,000 mg by mouth 2 (two) times daily. Reported on 10/06/2015    . OVER THE COUNTER MEDICATION Take 1 capsule by mouth 2 (two) times daily. OTC Focus Select    . triamcinolone cream (KENALOG) 0.1 % Apply 1 application topically daily as needed (for itching).      No current facility-administered medications on file prior to visit.     Allergies  Allergen Reactions  . Flomax [Tamsulosin Hcl] Other (See Comments)    Hypotension  . Shellfish Allergy Anaphylaxis  . Aspirin-Dipyridamole Er Other (See Comments)    Headaches   . Other Other (See Comments)    Blood Thinner given in Rehab gave H/As - Not Coumadin/Headaches  Family History  Problem Relation Age of Onset  . Pneumonia Mother 69       Deceased  . GI Bleed Father 32       Deceased - Ulcers  . Diabetes Cousin   . Diabetes Brother   . Cancer Paternal Aunt     Social History   Social History  . Marital status: Married    Spouse name: N/A  . Number of children: N/A  . Years of education: N/A   Social History Main Topics  . Smoking status: Former Research scientist (life sciences)  . Smokeless tobacco: Never Used     Comment: Hasn't smoked since age 43  . Alcohol use No  . Drug use: No  . Sexual activity: Not Asked   Other Topics Concern  . None   Social History  Narrative   Lives with wife in a one story home.  Has 1 child.  Retired Armed forces technical officer.  Education: Oceanographer.      Review of Systems - See HPI.  All other ROS are negative.  BP 110/62   Pulse 74   Temp 97.6 F (36.4 C) (Oral)   Resp 14   Ht '5\' 6"'  (1.676 m)   Wt 157 lb (71.2 kg)   SpO2 98%   BMI 25.34 kg/m   Physical Exam  Constitutional: He is oriented to person, place, and time and well-developed, well-nourished, and in no distress.  HENT:  Head: Normocephalic.  Eyes: Conjunctivae are normal.  Neck: Neck supple.  Cardiovascular: Normal rate, regular rhythm, normal heart sounds and intact distal pulses.   Pulmonary/Chest: Effort normal and breath sounds normal. No respiratory distress. He has no wheezes. He has no rales. He exhibits no tenderness.  Neurological: He is alert and oriented to person, place, and time.  Skin: Skin is warm and dry. No rash noted.  Psychiatric: Affect normal.  Vitals reviewed.  Recent Results (from the past 2160 hour(s))  POCT Urinalysis Dipstick (Automated)     Status: Abnormal   Collection Time: 12/17/16  1:31 PM  Result Value Ref Range   Color, UA yellow    Clarity, UA clear    Glucose, UA negative    Bilirubin, UA negative    Ketones, UA negative    Spec Grav, UA 1.015 1.030 - 1.035   Blood, UA 10    pH, UA 6.0 5.0 - 8.0   Protein, UA 0.15    Urobilinogen, UA negative Negative - 2.0   Nitrite, UA negative    Leukocytes, UA large (3+) (A) Negative  Urine Culture     Status: None   Collection Time: 12/17/16  1:56 PM  Result Value Ref Range   Culture ESCHERICHIA COLI    Colony Count Greater than 100,000 CFU/mL    Organism ID, Bacteria ESCHERICHIA COLI       Susceptibility   Escherichia coli -  (no method available)    AMPICILLIN <=2 Sensitive     AMOX/CLAVULANIC <=2 Sensitive     AMPICILLIN/SULBACTAM <=2 Sensitive     PIP/TAZO <=4 Sensitive     IMIPENEM <=0.25 Sensitive     CEFAZOLIN <=4 Not Reportable     CEFTRIAXONE <=1  Sensitive     CEFTAZIDIME <=1 Sensitive     CEFEPIME <=1 Sensitive     GENTAMICIN <=1 Sensitive     TOBRAMYCIN <=1 Sensitive     CIPROFLOXACIN <=0.25 Sensitive     LEVOFLOXACIN <=0.12 Sensitive     NITROFURANTOIN <=16 Sensitive     TRIMETH/SULFA* <=20  Sensitive      * NR=NOT REPORTABLE,SEE COMMENTORAL therapy:A cefazolin MIC of <32 predicts susceptibility to the oral agents cefaclor,cefdinir,cefpodoxime,cefprozil,cefuroxime,cephalexin,and loracarbef when used for therapy of uncomplicated UTIs due to E.coli,K.pneumomiae,and P.mirabilis. PARENTERAL therapy: A cefazolinMIC of >8 indicates resistance to parenteralcefazolin. An alternate test method must beperformed to confirm susceptibility to parenteralcefazolin.  Urinalysis, Routine w reflex microscopic     Status: None   Collection Time: 12/27/16  9:59 AM  Result Value Ref Range   Color, Urine YELLOW YELLOW   APPearance CLEAR CLEAR   Specific Gravity, Urine 1.009 1.005 - 1.030   pH 7.0 5.0 - 8.0   Glucose, UA NEGATIVE NEGATIVE mg/dL   Hgb urine dipstick NEGATIVE NEGATIVE   Bilirubin Urine NEGATIVE NEGATIVE   Ketones, ur NEGATIVE NEGATIVE mg/dL   Protein, ur NEGATIVE NEGATIVE mg/dL   Nitrite NEGATIVE NEGATIVE   Leukocytes, UA NEGATIVE NEGATIVE    Comment: Microscopic not done on urines with negative protein, blood, leukocytes, nitrite, or glucose < 500 mg/dL.  Urine culture     Status: None   Collection Time: 12/27/16  9:59 AM  Result Value Ref Range   Specimen Description URINE, CLEAN CATCH    Special Requests NONE    Culture      NO GROWTH Performed at [facility], [address] [place], [place] [address]    Report Status 12/28/2016 FINAL   Comprehensive metabolic panel     Status: Abnormal   Collection Time: 12/27/16 10:27 AM  Result Value Ref Range   Sodium 133 (L) 135 - 145 mmol/L   Potassium 4.2 3.5 - 5.1 mmol/L   Chloride 96 (L) 101 - 111 mmol/L   CO2 26 22 - 32 mmol/L   Glucose, Bld 192 (H) 65 - 99 mg/dL    BUN 14 6 - 20 mg/dL   Creatinine, Ser 1.07 0.61 - 1.24 mg/dL   Calcium 9.6 8.9 - 10.3 mg/dL   Total Protein 7.3 6.5 - 8.1 g/dL   Albumin 3.8 3.5 - 5.0 g/dL   AST 25 15 - 41 U/L   ALT 19 17 - 63 U/L   Alkaline Phosphatase 45 38 - 126 U/L   Total Bilirubin 0.7 0.3 - 1.2 mg/dL   GFR calc non Af Amer 59 (L) >60 mL/min   GFR calc Af Amer >60 >60 mL/min    Comment: (NOTE) The eGFR has been calculated using the CKD EPI equation. This calculation has not been validated in all clinical situations. eGFR's persistently <60 mL/min signify possible Chronic Kidney Disease.    Anion gap 11 5 - 15  CBC with Differential/Platelet     Status: Abnormal   Collection Time: 12/27/16 10:27 AM  Result Value Ref Range   WBC 7.3 4.0 - 10.5 K/uL   RBC 4.31 4.22 - 5.81 MIL/uL   Hemoglobin 12.7 (L) 13.0 - 17.0 g/dL   HCT 36.9 (L) 39.0 - 52.0 %   MCV 85.6 78.0 - 100.0 fL   MCH 29.5 26.0 - 34.0 pg   MCHC 34.4 30.0 - 36.0 g/dL   RDW 14.2 11.5 - 15.5 %   Platelets 191 150 - 400 K/uL   Neutrophils Relative % 67 %   Neutro Abs 4.9 1.7 - 7.7 K/uL   Lymphocytes Relative 10 %   Lymphs Abs 0.8 0.7 - 4.0 K/uL   Monocytes Relative 13 %   Monocytes Absolute 1.0 0.1 - 1.0 K/uL   Eosinophils Relative 9 %   Eosinophils Absolute 0.6 0.0 -  0.7 K/uL   Basophils Relative 1 %   Basophils Absolute 0.1 0.0 - 0.1 K/uL  POCT Urinalysis Dipstick     Status: Abnormal   Collection Time: 01/22/17 11:47 AM  Result Value Ref Range   Color, UA yellow    Clarity, UA cloudy    Glucose, UA negative    Bilirubin, UA negative    Ketones, UA negative    Spec Grav, UA 1.010 1.010 - 1.025   Blood, UA +-    pH, UA 6.5 5.0 - 8.0   Protein, UA negative    Urobilinogen, UA 0.2 0.2 or 1.0 E.U./dL   Nitrite, UA negative    Leukocytes, UA Large (3+) (A) Negative  Urine culture     Status: None   Collection Time: 01/22/17  1:49 PM  Result Value Ref Range   Organism ID, Bacteria NO GROWTH   Comprehensive metabolic panel     Status:  Abnormal   Collection Time: 02/17/17 11:43 PM  Result Value Ref Range   Sodium 133 (L) 135 - 145 mmol/L   Potassium 4.6 3.5 - 5.1 mmol/L   Chloride 97 (L) 101 - 111 mmol/L   CO2 22 22 - 32 mmol/L   Glucose, Bld 196 (H) 65 - 99 mg/dL   BUN 19 6 - 20 mg/dL   Creatinine, Ser 1.03 0.61 - 1.24 mg/dL   Calcium 9.5 8.9 - 10.3 mg/dL   Total Protein 6.6 6.5 - 8.1 g/dL   Albumin 3.5 3.5 - 5.0 g/dL   AST 23 15 - 41 U/L   ALT 17 17 - 63 U/L   Alkaline Phosphatase 45 38 - 126 U/L   Total Bilirubin 0.7 0.3 - 1.2 mg/dL   GFR calc non Af Amer >60 >60 mL/min   GFR calc Af Amer >60 >60 mL/min    Comment: (NOTE) The eGFR has been calculated using the CKD EPI equation. This calculation has not been validated in all clinical situations. eGFR's persistently <60 mL/min signify possible Chronic Kidney Disease.    Anion gap 14 5 - 15  Lipase, blood     Status: None   Collection Time: 02/17/17 11:43 PM  Result Value Ref Range   Lipase 30 11 - 51 U/L  Troponin I     Status: None   Collection Time: 02/17/17 11:43 PM  Result Value Ref Range   Troponin I <0.03 <0.03 ng/mL  CBC with Differential     Status: Abnormal   Collection Time: 02/17/17 11:43 PM  Result Value Ref Range   WBC 19.8 (H) 4.0 - 10.5 K/uL   RBC 4.47 4.22 - 5.81 MIL/uL   Hemoglobin 12.6 (L) 13.0 - 17.0 g/dL   HCT 37.5 (L) 39.0 - 52.0 %   MCV 83.9 78.0 - 100.0 fL   MCH 28.2 26.0 - 34.0 pg   MCHC 33.6 30.0 - 36.0 g/dL   RDW 14.2 11.5 - 15.5 %   Platelets 227 150 - 400 K/uL   Neutrophils Relative % 84 %   Neutro Abs 16.6 (H) 1.7 - 7.7 K/uL   Lymphocytes Relative 5 %   Lymphs Abs 1.1 0.7 - 4.0 K/uL   Monocytes Relative 7 %   Monocytes Absolute 1.4 (H) 0.1 - 1.0 K/uL   Eosinophils Relative 4 %   Eosinophils Absolute 0.7 0.0 - 0.7 K/uL   Basophils Relative 0 %   Basophils Absolute 0.0 0.0 - 0.1 K/uL  Urinalysis, Routine w reflex microscopic     Status: Abnormal  Collection Time: 02/18/17 12:00 AM  Result Value Ref Range    Color, Urine YELLOW YELLOW   APPearance CLEAR CLEAR   Specific Gravity, Urine 1.010 1.005 - 1.030   pH 6.0 5.0 - 8.0   Glucose, UA NEGATIVE NEGATIVE mg/dL   Hgb urine dipstick NEGATIVE NEGATIVE   Bilirubin Urine NEGATIVE NEGATIVE   Ketones, ur 5 (A) NEGATIVE mg/dL   Protein, ur NEGATIVE NEGATIVE mg/dL   Nitrite NEGATIVE NEGATIVE   Leukocytes, UA MODERATE (A) NEGATIVE   RBC / HPF 0-5 0 - 5 RBC/hpf   WBC, UA TOO NUMEROUS TO COUNT 0 - 5 WBC/hpf   Bacteria, UA NONE SEEN NONE SEEN   Squamous Epithelial / LPF NONE SEEN NONE SEEN  Culture, Urine     Status: Abnormal   Collection Time: 02/18/17 12:00 AM  Result Value Ref Range   Specimen Description URINE, RANDOM    Special Requests NONE    Culture 60,000 COLONIES/mL ENTEROCOCCUS FAECALIS (A)    Report Status 02/20/2017 FINAL    Organism ID, Bacteria ENTEROCOCCUS FAECALIS (A)       Susceptibility   Enterococcus faecalis - MIC*    AMPICILLIN <=2 SENSITIVE Sensitive     LEVOFLOXACIN 1 SENSITIVE Sensitive     NITROFURANTOIN <=16 SENSITIVE Sensitive     VANCOMYCIN 1 SENSITIVE Sensitive     * 60,000 COLONIES/mL ENTEROCOCCUS FAECALIS  Glucose, capillary     Status: Abnormal   Collection Time: 02/18/17  4:18 AM  Result Value Ref Range   Glucose-Capillary 186 (H) 65 - 99 mg/dL  CK     Status: Abnormal   Collection Time: 02/18/17  4:47 AM  Result Value Ref Range   Total CK 11 (L) 49 - 397 U/L  TSH     Status: None   Collection Time: 02/18/17  4:47 AM  Result Value Ref Range   TSH 1.197 0.350 - 4.500 uIU/mL    Comment: Performed by a 3rd Generation assay with a functional sensitivity of <=0.01 uIU/mL.  Troponin I     Status: None   Collection Time: 02/18/17  4:47 AM  Result Value Ref Range   Troponin I <0.03 <0.03 ng/mL  CBC     Status: Abnormal   Collection Time: 02/18/17  4:47 AM  Result Value Ref Range   WBC 16.9 (H) 4.0 - 10.5 K/uL   RBC 4.13 (L) 4.22 - 5.81 MIL/uL   Hemoglobin 11.7 (L) 13.0 - 17.0 g/dL   HCT 34.9 (L) 39.0 -  52.0 %   MCV 84.5 78.0 - 100.0 fL   MCH 28.3 26.0 - 34.0 pg   MCHC 33.5 30.0 - 36.0 g/dL   RDW 14.2 11.5 - 15.5 %   Platelets 212 150 - 400 K/uL  Creatinine, serum     Status: None   Collection Time: 02/18/17  4:47 AM  Result Value Ref Range   Creatinine, Ser 1.04 0.61 - 1.24 mg/dL   GFR calc non Af Amer >60 >60 mL/min   GFR calc Af Amer >60 >60 mL/min    Comment: (NOTE) The eGFR has been calculated using the CKD EPI equation. This calculation has not been validated in all clinical situations. eGFR's persistently <60 mL/min signify possible Chronic Kidney Disease.   Glucose, capillary     Status: Abnormal   Collection Time: 02/18/17  8:02 AM  Result Value Ref Range   Glucose-Capillary 217 (H) 65 - 99 mg/dL  Glucose, capillary     Status: Abnormal   Collection Time:  02/18/17 11:41 AM  Result Value Ref Range   Glucose-Capillary 226 (H) 65 - 99 mg/dL  Glucose, capillary     Status: Abnormal   Collection Time: 02/18/17  5:19 PM  Result Value Ref Range   Glucose-Capillary 158 (H) 65 - 99 mg/dL  Glucose, capillary     Status: Abnormal   Collection Time: 02/18/17  8:59 PM  Result Value Ref Range   Glucose-Capillary 198 (H) 65 - 99 mg/dL  Glucose, capillary     Status: Abnormal   Collection Time: 02/19/17  7:47 AM  Result Value Ref Range   Glucose-Capillary 171 (H) 65 - 99 mg/dL  Glucose, capillary     Status: Abnormal   Collection Time: 02/19/17 12:36 PM  Result Value Ref Range   Glucose-Capillary 187 (H) 65 - 99 mg/dL  Glucose, capillary     Status: Abnormal   Collection Time: 02/19/17  5:25 PM  Result Value Ref Range   Glucose-Capillary 151 (H) 65 - 99 mg/dL  Glucose, capillary     Status: Abnormal   Collection Time: 02/19/17  8:09 PM  Result Value Ref Range   Glucose-Capillary 157 (H) 65 - 99 mg/dL  Glucose, capillary     Status: Abnormal   Collection Time: 02/19/17 10:31 PM  Result Value Ref Range   Glucose-Capillary 164 (H) 65 - 99 mg/dL  Glucose, capillary      Status: Abnormal   Collection Time: 02/20/17  5:26 AM  Result Value Ref Range   Glucose-Capillary 175 (H) 65 - 99 mg/dL   Comment 1 Notify RN    Comment 2 Document in Chart   CBC     Status: Abnormal   Collection Time: 02/20/17  5:37 AM  Result Value Ref Range   WBC 9.0 4.0 - 10.5 K/uL   RBC 4.02 (L) 4.22 - 5.81 MIL/uL   Hemoglobin 11.2 (L) 13.0 - 17.0 g/dL   HCT 33.5 (L) 39.0 - 52.0 %   MCV 83.3 78.0 - 100.0 fL   MCH 27.9 26.0 - 34.0 pg   MCHC 33.4 30.0 - 36.0 g/dL   RDW 14.2 11.5 - 15.5 %   Platelets 195 150 - 400 K/uL  Basic metabolic panel     Status: Abnormal   Collection Time: 02/20/17  5:37 AM  Result Value Ref Range   Sodium 136 135 - 145 mmol/L   Potassium 4.0 3.5 - 5.1 mmol/L   Chloride 102 101 - 111 mmol/L   CO2 25 22 - 32 mmol/L   Glucose, Bld 181 (H) 65 - 99 mg/dL   BUN 16 6 - 20 mg/dL   Creatinine, Ser 0.99 0.61 - 1.24 mg/dL   Calcium 9.2 8.9 - 10.3 mg/dL   GFR calc non Af Amer >60 >60 mL/min   GFR calc Af Amer >60 >60 mL/min    Comment: (NOTE) The eGFR has been calculated using the CKD EPI equation. This calculation has not been validated in all clinical situations. eGFR's persistently <60 mL/min signify possible Chronic Kidney Disease.    Anion gap 9 5 - 15  Glucose, capillary     Status: Abnormal   Collection Time: 02/20/17  7:55 AM  Result Value Ref Range   Glucose-Capillary 182 (H) 65 - 99 mg/dL  Glucose, capillary     Status: Abnormal   Collection Time: 02/20/17 12:04 PM  Result Value Ref Range   Glucose-Capillary 273 (H) 65 - 99 mg/dL   Assessment/Plan: 1. Uncontrolled type 2 diabetes mellitus without complication, without  long-term current use of insulin (HCC) Due to A1C. Will obtain today. Up-to-date on health maintenance parameters. Continue current regimen.  - Hemoglobin A1c  2. Acute cystitis without hematuria Resolved. Asymptomatic. Has follow-up scheduled with Urology.  Plan to resume outpatient PT for generalized weakness and gait  instability.    Leeanne Rio, PA-C

## 2017-03-05 NOTE — Patient Instructions (Signed)
Please go to the lab for blood work. I will call you with your results.   Please continue medications as directed. I am setting you up for outpatient physical therapy.   Follow-up with Urology as scheduled. Follow-up with me in 1 month.

## 2017-03-07 ENCOUNTER — Telehealth: Payer: Self-pay | Admitting: Physician Assistant

## 2017-03-07 ENCOUNTER — Emergency Department (HOSPITAL_BASED_OUTPATIENT_CLINIC_OR_DEPARTMENT_OTHER): Payer: Medicare Other

## 2017-03-07 ENCOUNTER — Inpatient Hospital Stay (HOSPITAL_BASED_OUTPATIENT_CLINIC_OR_DEPARTMENT_OTHER)
Admission: EM | Admit: 2017-03-07 | Discharge: 2017-03-09 | DRG: 872 | Disposition: A | Discharge status: Home / Self Care | Payer: Medicare Other | Attending: Internal Medicine | Admitting: Internal Medicine

## 2017-03-07 DIAGNOSIS — Z888 Allergy status to other drugs, medicaments and biological substances status: Secondary | ICD-10-CM

## 2017-03-07 DIAGNOSIS — Z7982 Long term (current) use of aspirin: Secondary | ICD-10-CM

## 2017-03-07 DIAGNOSIS — Z87440 Personal history of urinary (tract) infections: Secondary | ICD-10-CM

## 2017-03-07 DIAGNOSIS — E039 Hypothyroidism, unspecified: Secondary | ICD-10-CM | POA: Diagnosis present

## 2017-03-07 DIAGNOSIS — Z7984 Long term (current) use of oral hypoglycemic drugs: Secondary | ICD-10-CM

## 2017-03-07 DIAGNOSIS — N390 Urinary tract infection, site not specified: Secondary | ICD-10-CM | POA: Diagnosis present

## 2017-03-07 DIAGNOSIS — N3000 Acute cystitis without hematuria: Secondary | ICD-10-CM

## 2017-03-07 DIAGNOSIS — Z79899 Other long term (current) drug therapy: Secondary | ICD-10-CM

## 2017-03-07 DIAGNOSIS — E118 Type 2 diabetes mellitus with unspecified complications: Secondary | ICD-10-CM | POA: Diagnosis not present

## 2017-03-07 DIAGNOSIS — I10 Essential (primary) hypertension: Secondary | ICD-10-CM | POA: Diagnosis not present

## 2017-03-07 DIAGNOSIS — R112 Nausea with vomiting, unspecified: Secondary | ICD-10-CM | POA: Diagnosis not present

## 2017-03-07 DIAGNOSIS — A419 Sepsis, unspecified organism: Principal | ICD-10-CM | POA: Diagnosis present

## 2017-03-07 DIAGNOSIS — K219 Gastro-esophageal reflux disease without esophagitis: Secondary | ICD-10-CM | POA: Diagnosis present

## 2017-03-07 DIAGNOSIS — Z833 Family history of diabetes mellitus: Secondary | ICD-10-CM

## 2017-03-07 DIAGNOSIS — Z91013 Allergy to seafood: Secondary | ICD-10-CM

## 2017-03-07 DIAGNOSIS — E785 Hyperlipidemia, unspecified: Secondary | ICD-10-CM | POA: Diagnosis present

## 2017-03-07 DIAGNOSIS — K5900 Constipation, unspecified: Secondary | ICD-10-CM | POA: Diagnosis not present

## 2017-03-07 DIAGNOSIS — E871 Hypo-osmolality and hyponatremia: Secondary | ICD-10-CM | POA: Diagnosis present

## 2017-03-07 DIAGNOSIS — Z66 Do not resuscitate: Secondary | ICD-10-CM | POA: Diagnosis present

## 2017-03-07 DIAGNOSIS — D72829 Elevated white blood cell count, unspecified: Secondary | ICD-10-CM | POA: Diagnosis not present

## 2017-03-07 DIAGNOSIS — E11319 Type 2 diabetes mellitus with unspecified diabetic retinopathy without macular edema: Secondary | ICD-10-CM | POA: Diagnosis present

## 2017-03-07 DIAGNOSIS — Z8673 Personal history of transient ischemic attack (TIA), and cerebral infarction without residual deficits: Secondary | ICD-10-CM

## 2017-03-07 DIAGNOSIS — D649 Anemia, unspecified: Secondary | ICD-10-CM | POA: Diagnosis present

## 2017-03-07 DIAGNOSIS — I480 Paroxysmal atrial fibrillation: Secondary | ICD-10-CM | POA: Diagnosis present

## 2017-03-07 DIAGNOSIS — Z952 Presence of prosthetic heart valve: Secondary | ICD-10-CM

## 2017-03-07 DIAGNOSIS — R1110 Vomiting, unspecified: Secondary | ICD-10-CM | POA: Diagnosis not present

## 2017-03-07 DIAGNOSIS — R509 Fever, unspecified: Secondary | ICD-10-CM | POA: Diagnosis not present

## 2017-03-07 DIAGNOSIS — Z886 Allergy status to analgesic agent status: Secondary | ICD-10-CM

## 2017-03-07 DIAGNOSIS — Z87891 Personal history of nicotine dependence: Secondary | ICD-10-CM

## 2017-03-07 LAB — COMPREHENSIVE METABOLIC PANEL
ALK PHOS: 43 U/L (ref 38–126)
ALT: 18 U/L (ref 17–63)
AST: 31 U/L (ref 15–41)
Albumin: 3.7 g/dL (ref 3.5–5.0)
Anion gap: 15 (ref 5–15)
BILIRUBIN TOTAL: 1 mg/dL (ref 0.3–1.2)
BUN: 20 mg/dL (ref 6–20)
CALCIUM: 9.6 mg/dL (ref 8.9–10.3)
CO2: 22 mmol/L (ref 22–32)
CREATININE: 1 mg/dL (ref 0.61–1.24)
Chloride: 96 mmol/L — ABNORMAL LOW (ref 101–111)
GFR calc Af Amer: >60 mL/min (ref >60)
GFR calc non Af Amer: >60 mL/min (ref >60)
Glucose, Bld: 132 mg/dL — ABNORMAL HIGH (ref 65–99)
POTASSIUM: 4.5 mmol/L (ref 3.5–5.1)
Sodium: 133 mmol/L — ABNORMAL LOW (ref 135–145)
TOTAL PROTEIN: 7.1 g/dL (ref 6.5–8.1)

## 2017-03-07 LAB — URINALYSIS, MICROSCOPIC (REFLEX)

## 2017-03-07 LAB — URINALYSIS, ROUTINE W REFLEX MICROSCOPIC
Bilirubin Urine: NEGATIVE
GLUCOSE, UA: NEGATIVE mg/dL
Hgb urine dipstick: NEGATIVE
KETONES UR: 15 mg/dL — AB
Nitrite: NEGATIVE
PH: 6.5 (ref 5.0–8.0)
Protein, ur: NEGATIVE mg/dL
SPECIFIC GRAVITY, URINE: 1.013 (ref 1.005–1.030)

## 2017-03-07 LAB — CBC WITH DIFFERENTIAL/PLATELET
BASOS ABS: 0.1 10*3/uL (ref 0.0–0.1)
BASOS PCT: 0 %
EOS ABS: 0.7 10*3/uL (ref 0.0–0.7)
EOS PCT: 4 %
HCT: 35.8 % — ABNORMAL LOW (ref 39.0–52.0)
Hemoglobin: 12.5 g/dL — ABNORMAL LOW (ref 13.0–17.0)
LYMPHS PCT: 8 %
Lymphs Abs: 1.5 10*3/uL (ref 0.7–4.0)
MCH: 28.9 pg (ref 26.0–34.0)
MCHC: 34.9 g/dL (ref 30.0–36.0)
MCV: 82.9 fL (ref 78.0–100.0)
MONO ABS: 0.7 10*3/uL (ref 0.1–1.0)
Monocytes Relative: 4 %
Neutro Abs: 16.2 10*3/uL — ABNORMAL HIGH (ref 1.7–7.7)
Neutrophils Relative %: 84 %
PLATELETS: 225 10*3/uL (ref 150–400)
RBC: 4.32 MIL/uL (ref 4.22–5.81)
RDW: 14.1 % (ref 11.5–15.5)
WBC: 19.3 10*3/uL — ABNORMAL HIGH (ref 4.0–10.5)

## 2017-03-07 LAB — LIPASE, BLOOD: Lipase: 25 U/L (ref 11–51)

## 2017-03-07 LAB — TROPONIN I

## 2017-03-07 MED ORDER — LISINOPRIL 2.5 MG PO TABS
2.5000 mg | ORAL_TABLET | Freq: Every day | ORAL | Status: DC
  Administered 2017-03-08 – 2017-03-09 (×2): 2.5 mg via ORAL
  Filled 2017-03-07 (×2): qty 1

## 2017-03-07 MED ORDER — SODIUM CHLORIDE 0.9% FLUSH
3.0000 mL | Freq: Two times a day (BID) | INTRAVENOUS | Status: DC
  Administered 2017-03-08 – 2017-03-09 (×2): 3 mL via INTRAVENOUS

## 2017-03-07 MED ORDER — CYANOCOBALAMIN 1000 MCG/ML IJ SOLN: 1000.0000 ug | INTRAMUSCULAR | Status: DC

## 2017-03-07 MED ORDER — ACETAMINOPHEN 650 MG RE SUPP: 650.0000 mg | Freq: Four times a day (QID) | RECTAL | Status: DC | PRN

## 2017-03-07 MED ORDER — IOPAMIDOL (ISOVUE-300) INJECTION 61%
INTRAVENOUS | Status: AC
  Filled 2017-03-07: qty 30

## 2017-03-07 MED ORDER — ASPIRIN 81 MG PO CHEW
81.0000 mg | CHEWABLE_TABLET | Freq: Every day | ORAL | Status: DC
  Administered 2017-03-08 – 2017-03-09 (×2): 81 mg via ORAL
  Filled 2017-03-07 (×2): qty 1

## 2017-03-07 MED ORDER — SODIUM CHLORIDE 0.9 % IV BOLUS (SEPSIS): 1000.0000 mL | Freq: Once | INTRAVENOUS | Status: DC

## 2017-03-07 MED ORDER — ONDANSETRON HCL 4 MG/2ML IJ SOLN
4.0000 mg | Freq: Once | INTRAMUSCULAR | Status: AC
Start: 2017-03-07 — End: 2017-03-07
  Administered 2017-03-07: 4 mg via INTRAVENOUS
  Filled 2017-03-07: qty 2

## 2017-03-07 MED ORDER — STERILID EX FOAM: CUTANEOUS | Status: DC

## 2017-03-07 MED ORDER — ZOLPIDEM TARTRATE 5 MG PO TABS: 5.0000 mg | ORAL_TABLET | Freq: Every evening | ORAL | Status: DC | PRN

## 2017-03-07 MED ORDER — CRANBERRY 1000 MG PO CAPS: 2.0000 | ORAL_CAPSULE | Freq: Every day | ORAL | Status: DC

## 2017-03-07 MED ORDER — DEXTROSE 5 % IV SOLN
1.0000 g | INTRAVENOUS | Status: DC
  Administered 2017-03-08: 1 g via INTRAVENOUS
  Filled 2017-03-07 (×2): qty 10

## 2017-03-07 MED ORDER — PRAVASTATIN SODIUM 20 MG PO TABS
10.0000 mg | ORAL_TABLET | Freq: Every day | ORAL | Status: DC
  Administered 2017-03-08: 10 mg via ORAL
  Filled 2017-03-07: qty 1

## 2017-03-07 MED ORDER — BLACK ELDERBERRY(BERRY-FLOWER) 575 MG PO CAPS: 1.0000 | ORAL_CAPSULE | Freq: Three times a day (TID) | ORAL | Status: DC

## 2017-03-07 MED ORDER — OMEGA-3-ACID ETHYL ESTERS 1 G PO CAPS
1000.0000 mg | ORAL_CAPSULE | Freq: Every day | ORAL | Status: DC
  Administered 2017-03-08 – 2017-03-09 (×2): 1000 mg via ORAL
  Filled 2017-03-07 (×2): qty 1

## 2017-03-07 MED ORDER — LEVOTHYROXINE SODIUM 50 MCG PO TABS
50.0000 ug | ORAL_TABLET | Freq: Every day | ORAL | Status: DC
  Administered 2017-03-08 – 2017-03-09 (×2): 50 ug via ORAL
  Filled 2017-03-07 (×2): qty 1

## 2017-03-07 MED ORDER — TRIAMCINOLONE ACETONIDE 0.1 % EX CREA: 1.0000 "application " | TOPICAL_CREAM | Freq: Every day | CUTANEOUS | Status: DC | PRN

## 2017-03-07 MED ORDER — ONDANSETRON HCL 4 MG/2ML IJ SOLN: 4.0000 mg | Freq: Three times a day (TID) | INTRAMUSCULAR | Status: DC | PRN

## 2017-03-07 MED ORDER — ACETAMINOPHEN 325 MG PO TABS: 650.0000 mg | ORAL_TABLET | Freq: Four times a day (QID) | ORAL | Status: DC | PRN

## 2017-03-07 MED ORDER — METOPROLOL TARTRATE 12.5 MG HALF TABLET
12.5000 mg | ORAL_TABLET | Freq: Two times a day (BID) | ORAL | Status: DC
  Administered 2017-03-08 – 2017-03-09 (×4): 12.5 mg via ORAL
  Filled 2017-03-07 (×4): qty 1

## 2017-03-07 MED ORDER — SODIUM CHLORIDE 0.9 % IV SOLN
INTRAVENOUS | Status: DC
  Administered 2017-03-08 (×2): via INTRAVENOUS

## 2017-03-07 MED ORDER — MICONAZOLE NITRATE 2 % EX OINT: 1.0000 "application " | TOPICAL_OINTMENT | Freq: Every day | CUTANEOUS | Status: DC | PRN

## 2017-03-07 MED ORDER — SODIUM CHLORIDE 0.9 % IV BOLUS (SEPSIS)
500.0000 mL | Freq: Once | INTRAVENOUS | Status: AC
  Administered 2017-03-07: 500 mL via INTRAVENOUS

## 2017-03-07 MED ORDER — INSULIN ASPART 100 UNIT/ML ~~LOC~~ SOLN: 0.0000 [IU] | Freq: Every day | SUBCUTANEOUS | Status: DC

## 2017-03-07 MED ORDER — DEXTROSE 5 % IV SOLN
1.0000 g | Freq: Once | INTRAVENOUS | Status: AC
  Administered 2017-03-07: 1 g via INTRAVENOUS
  Filled 2017-03-07: qty 10

## 2017-03-07 MED ORDER — DOCUSATE SODIUM 100 MG PO CAPS: 200.0000 mg | ORAL_CAPSULE | Freq: Every day | ORAL | Status: DC | PRN

## 2017-03-07 MED ORDER — INSULIN ASPART 100 UNIT/ML ~~LOC~~ SOLN
0.0000 [IU] | Freq: Three times a day (TID) | SUBCUTANEOUS | Status: DC
  Administered 2017-03-08: 2 [IU] via SUBCUTANEOUS
  Administered 2017-03-08: 1 [IU] via SUBCUTANEOUS
  Administered 2017-03-08: 3 [IU] via SUBCUTANEOUS
  Administered 2017-03-09 (×2): 2 [IU] via SUBCUTANEOUS

## 2017-03-07 NOTE — ED Triage Notes (Signed)
Vomiting this am. Fever this am. He had the same hx 2 weeks ago and he was admitted. Hx UTI.

## 2017-03-07 NOTE — Telephone Encounter (Signed)
Patient Name: DEVAL MROCZKA DOB: [birth year] Initial Comment callers husband has vomited twice today / no fever . this also happened two weeks ago an he was disoriented an spent 4 days in hosptital . his temp is 98.6 but his temp is usually 97. Nurse Assessment Nurse: Emilio Math, RN, [place] Date/Time (Eastern Time): 03/07/2017 1:55:02 PM Confirm and document reason for call. If symptomatic, describe symptoms. ---Caller states husband has vomited x2 today. Denies fever. Was admitted x2 weeks ago d/t vomiting and confusion. States he "feels weak". Does the patient have any new or worsening symptoms? ---Yes Will a triage be completed? ---Yes Related visit to physician within the last 2 weeks? ---Yes Does the PT have any chronic conditions? (i.e. diabetes, asthma, etc.) ---Yes List chronic conditions. ---Urinary retention, Diabetes, A-fib, stroke in 2010 Is this a behavioral health or substance abuse call? ---No Guidelines Guideline Title Affirmed Question Affirmed Notes Vomiting High-risk adult (e.g., diabetes mellitus, brain tumor, V-P shunt, hernia) Final Disposition User Go to ED Now (or PCP triage) Emilio Math, RN, [place] [facility] - ED Disagree/Comply: Comply

## 2017-03-07 NOTE — H&P (Signed)
History and Physical    Gerald Holt WER:[id number] DOB: 08/18/1924 DOA: 03/07/2017  Referring MD/NP/PA:   PCP: [name], PA-C   Patient coming from:  The patient is coming from home.  At baseline, pt is partially dependent for most of ADL.  Chief Complaint: Nausea, vomiting, constipation  HPI: Gerald Holt is a 83 y.o. male with medical history significant of hypertension, hyperlipidemia, diabetes mellitus, atrial fibrillation  Not on anticoagulants, intermittent self catheterization of the bladder, who presents with nausea, vomiting and constipation.  Patient states that she started having nausea and vomiting sicne yesterday morning, no diarrhea. She denies abdominal  But reports burning sensation in epigastric area. He states that he has been constipated for 1 week. Patient does not have dysuria or burning on urination, but reports difficulty urinating. He needs to do self catheterization of the bladder. Patient does not  unilateral weakness.  ED Course: pt was found to have WBC 19.3, lactic acid is 3.8, negative troponin, lipase 25, UA with trace amount of leukocytes and many bacteria, creatinine normal, temperature 98.8 oxygen saturation 91-94% on room. CT-abdomen/pelvis showed moderate stool in the colon with moderate feces retention in the rectum, no SBO. Pt is placed on tele bed for obs  Review of Systems:   General: no fevers, chills, no changes in body weight, has poor appetite, has fatigue HEENT: no blurry vision, hearing changes or sore throat Respiratory: no dyspnea, coughing, wheezing CV: no chest pain, no palpitations GI: has nausea, vomiting, no abdominal pain, diarrhea, has constipation GU: no dysuria, burning on urination, increased urinary frequency, hematuria. Has difficulty urinating.  Ext: no leg edema Neuro: no unilateral weakness, numbness, or tingling, no vision change or hearing loss Skin: no rash, no skin tear. MSK: No muscle spasm, no deformity,  no limitation of range of movement in spin Heme: No easy bruising.  Travel history: No recent long distant travel.  Allergy:  Allergies  Allergen Reactions  . Flomax [Tamsulosin Hcl] Other (See Comments)    Hypotension  . Shellfish Allergy Anaphylaxis  . Aspirin-Dipyridamole Er Other (See Comments)    Headaches   . Other Other (See Comments)    Blood Thinner given in Rehab gave H/As - Not Coumadin/Headaches      Past Medical History:  Diagnosis Date  . A-fib ([place])   . Cataracts, bilateral   . Chronic dermatitis   . Diabetes type 2, controlled ([place]) 2000  . History of chicken pox   . Mumps    Adult  . Retinopathy   . Shingles   . Stroke [facility]) Nov 2011  . Undescended testicle, unilateral    Right  . Whooping cough     Past Surgical History:  Procedure Laterality Date  . CARDIAC VALVE REPLACEMENT    . PRE-MALIGNANT / BENIGN SKIN LESION EXCISION    . TESTICLE SURGERY     Right, undescended  . TOOTH EXTRACTION      Social History:  reports that he has quit smoking. He has never used smokeless tobacco. He reports that he does not drink alcohol or use drugs.  Family History:  Family History  Problem Relation Age of Onset  . Pneumonia Mother 26       Deceased  . GI Bleed Father 108       Deceased - Ulcers  . Diabetes Cousin   . Diabetes Brother   . Cancer Paternal Aunt      Prior to Admission medications   Medication Sig Start Date End  Date Taking? Authorizing Provider  aspirin 81 MG tablet Take 1 tablet (81 mg total) by mouth daily. 06/22/15   Jerline Pain, MD  Black Elderberry,Berry-Flower, 575 MG CAPS Take 1 capsule by mouth 3 (three) times daily with meals.    [provider]  Calcium Carbonate Antacid (ALKA-SELTZER ANTACID PO) Take 2 tablets by mouth daily as needed (for indegestion).     [provider]  Cranberry 1000 MG CAPS Take 2 capsules by mouth daily.    [provider]  cyanocobalamin (,VITAMIN B-12,) 1000 MCG/ML  injection Inject 1000 mcg intramuscular daily for 7 days, then once a week for 4 weeks, then once a month for a year Patient taking differently: Inject 1,000 mcg into the muscle every 30 (thirty) days.  04/02/16   Pieter Partridge, DO  docusate sodium (COLACE) 100 MG capsule Take 200 mg by mouth daily as needed for mild constipation.     [provider]  Eyelid Cleansers (STERILID EX) Apply 1 application topically See admin instructions. Eye Wash: Once Daily     [provider]  glucose blood test strip One Touch Verio strips Use as instructed to check fasting sugars once daily .Dx:E11.9 11/20/16   [name], PA-C  levothyroxine (SYNTHROID, LEVOTHROID) 50 MCG tablet Take 1 tablet (50 mcg total) by mouth daily. 04/09/16   [name], PA-C  lisinopril (PRINIVIL,ZESTRIL) 2.5 MG tablet Take 1 tablet by mouth daily. 12/02/16   [provider]  lovastatin (MEVACOR) 10 MG tablet TAKE 1 TABLET AT BEDTIME 02/27/17   [name], PA-C  metFORMIN (GLUCOPHAGE-XR) 500 MG 24 hr tablet TAKE 2 TABLETS WITH BREAKFAST AND AT BEDTIME 02/27/17   [name], PA-C  metoprolol tartrate (LOPRESSOR) 25 MG tablet Take 0.5 tablets (12.5 mg total) by mouth 2 (two) times daily. 04/09/16   [name], PA-C  Miconazole Nitrate (TRIPLE PASTE AF) 2 % OINT Apply 1 application topically daily as needed (mouth care).     [provider]  Omega-3 Fatty Acids (SB OMEGA-3 FISH OIL) 1000 MG CAPS Take 1,000 mg by mouth 2 (two) times daily. Reported on 10/06/2015    [provider]  OVER THE COUNTER MEDICATION Take 1 capsule by mouth 2 (two) times daily. OTC Focus Select    [provider]  triamcinolone cream (KENALOG) 0.1 % Apply 1 application topically daily as needed (for itching).  02/01/16   [provider]    Physical Exam: Vitals:   03/07/17 2030 03/07/17 2154 03/08/17 0130 03/08/17 0428  BP: 122/65 (!) 113/52 126/61 (!) 125/54  Pulse: 77 82 77 63   Resp:  18  18  Temp:  98.8 F (37.1 C)  97.6 F (36.4 C)  TempSrc:  Oral  Oral  SpO2: 94% 94%  97%   General: Not in acute distress HEENT:       Eyes: PERRL, EOMI, no scleral icterus.       ENT: No discharge from the ears and nose, no pharynx injection, no tonsillar enlargement.        Neck: No JVD, no bruit, no mass felt. Heme: No neck lymph node enlargement. Cardiac: S1/S2, RRR, No murmurs, No gallops or rubs. Respiratory: No rales, wheezing, rhonchi or rubs. GI: midly distended, nontender, no rebound pain, no organomegaly, BS present. GU: No hematuria Ext: No pitting leg edema bilaterally. 2+DP/PT pulse bilaterally. Musculoskeletal: No joint deformities, No joint redness or warmth, no limitation of ROM in spin. Skin: No rashes.  Neuro: Alert, oriented X3, cranial nerves II-XII grossly intact, moves all extremities normally.  Psych: Patient is not psychotic, no suicidal or hemocidal ideation.  Labs on Admission: I have personally reviewed following labs and imaging studies  CBC:  Recent Labs Lab 03/07/17 1540 03/08/17 0300  WBC 19.3* 12.2*  NEUTROABS 16.2*  --   HGB 12.5* 10.6*  HCT 35.8* 31.8*  MCV 82.9 84.1  PLT 225 992   Basic Metabolic Panel:  Recent Labs Lab 03/07/17 1540 03/08/17 0300  NA 133* 130*  K 4.5 3.7  CL 96* 98*  CO2 22 26  GLUCOSE 132* 138*  BUN 20 15  CREATININE 1.00 0.95  CALCIUM 9.6 8.5*   GFR: Estimated Creatinine Clearance: 45.7 mL/min (by C-G formula based on SCr of 0.95 mg/dL). Liver Function Tests:  Recent Labs Lab 03/07/17 1540  AST 31  ALT 18  ALKPHOS 43  BILITOT 1.0  PROT 7.1  ALBUMIN 3.7    Recent Labs Lab 03/07/17 2256  LIPASE 25   No results for input(s): AMMONIA in the last 168 hours. Coagulation Profile:  Recent Labs Lab 03/08/17 0300  INR 1.11   Cardiac Enzymes:  Recent Labs Lab 03/07/17 1540  TROPONINI <0.03   BNP (last 3 results) No results for input(s): PROBNP in the last 8760  hours. HbA1C:  Recent Labs  03/05/17 1204  HGBA1C 7.2*   CBG:  Recent Labs Lab 03/08/17 0324  GLUCAP 137*   Lipid Profile: No results for input(s): CHOL, HDL, LDLCALC, TRIG, CHOLHDL, LDLDIRECT in the last 72 hours. Thyroid Function Tests: No results for input(s): TSH, T4TOTAL, FREET4, T3FREE, THYROIDAB in the last 72 hours. Anemia Panel: No results for input(s): VITAMINB12, FOLATE, FERRITIN, TIBC, IRON, RETICCTPCT in the last 72 hours. Urine analysis:    Component Value Date/Time   COLORURINE YELLOW 03/07/2017 [place] 03/07/2017 1555   LABSPEC 1.013 03/07/2017 1555   PHURINE 6.5 03/07/2017 1555   GLUCOSEU NEGATIVE 03/07/2017 [place] 10/17/2016 1533   HGBUR NEGATIVE 03/07/2017 1555   BILIRUBINUR NEGATIVE 03/07/2017 1555   BILIRUBINUR negative 01/22/2017 1147   KETONESUR 15 (A) 03/07/2017 1555   PROTEINUR NEGATIVE 03/07/2017 1555   UROBILINOGEN 0.2 01/22/2017 1147   UROBILINOGEN 0.2 10/17/2016 1533   NITRITE NEGATIVE 03/07/2017 1555   LEUKOCYTESUR TRACE (A) 03/07/2017 1555   Sepsis Labs: @LABRCNTIP (procalcitonin:4,lacticidven:4) )No results found for this or any previous visit (from the past 240 hour(s)).   Radiological Exams on Admission: Ct Abdomen Pelvis W Contrast  Result Date: 03/08/2017 CLINICAL DATA:  Nausea vomiting and constipation EXAM: CT ABDOMEN AND PELVIS WITH CONTRAST TECHNIQUE: Multidetector CT imaging of the abdomen and pelvis was performed using the standard protocol following bolus administration of intravenous contrast. CONTRAST:  149mL ISOVUE-300 IOPAMIDOL (ISOVUE-300) INJECTION 61% COMPARISON:  03/07/2017 radiograph FINDINGS: Lower chest: Lung bases demonstrate streaky dependent atelectasis. No pleural effusion. Coronary artery calcifications. No pericardial effusion. Mitral annular calcification. Hepatobiliary: Calcified gallstone. No biliary dilatation. No focal hepatic abnormality Pancreas: Unremarkable. No  pancreatic ductal dilatation or surrounding inflammatory changes. Spleen: Normal in size without focal abnormality. Adrenals/Urinary Tract: Adrenal glands are unremarkable. Kidneys are normal, without renal calculi, focal lesion, or hydronephrosis. Foley catheter in the bladder with small amount of air in the bladder. Stomach/Bowel: The stomach is nonenlarged. There is no evidence for a bowel obstruction. No significant colon wall thickening. Moderate stool in the colon. Normal appendix. Mild feces impaction in the rectum Vascular/Lymphatic: Aortic atherosclerosis. No enlarged abdominal or pelvic lymph  nodes. Reproductive: No masses Other: No free air or free fluid. Musculoskeletal: Degenerative changes of the spine. No acute or suspicious bone lesion. IMPRESSION: 1. Negative for bowel obstruction or bowel wall thickening. Moderate stool in the colon with moderate feces retention in the rectum. 2. Gallstones Electronically Signed   By: Donavan Foil M.D.   On: 03/08/2017 03:08   Dg Abd Acute W/chest  Result Date: 03/07/2017 CLINICAL DATA:  Vomiting and fever. EXAM: DG ABDOMEN ACUTE W/ 1V CHEST COMPARISON:  Chest x-ray on 02/18/2017 FINDINGS: There is no evidence of pulmonary edema, consolidation, pneumothorax, nodule or pleural fluid. The heart size is normal. Abdominal films show no evidence of acute bowel obstruction or free intraperitoneal air. There are a few air-fluid levels in small bowel which may be consistent with enteritis. Scattered stool throughout the colon without evidence of fecal impaction. Bony structures are unremarkable. The splenic artery is calcified. IMPRESSION: Scattered air-fluid levels in small bowel which may be consistent with enteritis. No evidence of small bowel obstruction or free intraperitoneal air. Electronically Signed   By: Aletta Edouard M.D.   On: 03/07/2017 16:40     EKG: Independently reviewed.  Sinus rhythm, QTC 473, right bundle blockage which is  old.   Assessment/Plan Principal Problem:   Nausea & vomiting Active Problems:   Paroxysmal atrial fibrillation (HCC)   HLD (hyperlipidemia)   Hypothyroidism   Sepsis ([place])   Diabetes mellitus with complication (HCC)   UTI (urinary tract infection)   Essential hypertension   Constipation   Nausea & vomiting: Most likely due to constipation as evidenced by the CT scan of abdomen. No SBO by CT scan. -will admit to tele bed for obs -Start MiraLAX and senokot -IVF: 2.5 L NS and then 100 cc/h  Possible UTI and sepsis: Urinalysis with trace amount of leukocyte and many bacteria's. Patient has a difficulty urinating, needed self catheterization. Patient has leukocytosis and elevated lactic acid is 3.8, meets criteria for sepsis. Currently hemodynamically stable. -Rocephin IV -F/u Bx and Ux -will get Procalcitonin and trend lactic acid levels per sepsis protocol. -IVF: 2.5L of NS bolus in ED, followed by 100 cc/h   Atrial Fibrillation: CHA2DS2-VASc Score is 6, needs oral anticoagulation, but patient is not on AC, unclear reason. May be due to old age and risk of fall. Heart rate is well controlled. -Continue metoprolol  HLD: - Lovastatin  Hypothyroidism: Last TSH was on 1.0197, on 02/18/17 -Continue home Synthroid  DM-II: Last A1c 7.2 on 03/05/17, fairly controled. Patient is taking metformin at home -SSI  HTN: -Lisinopril and metoprolol  Stroke: -ASA, and lovastatin    DVT ppx: lovenox Code Status: DNR (I discussed with patient and explained the meaning of [place]. Patient wants to be DNR). Family Communication: None at bed side.  Disposition Plan:  Anticipate discharge back to previous home environment Consults called:  none Admission status: Obs / tele    Date of Service 03/08/2017    Ivor Costa [facility] Pager [phone]  If 7PM-7AM, please contact night-coverage [email or web address] Password [facility] 03/08/2017, 5:34 AM

## 2017-03-07 NOTE — ED Provider Notes (Signed)
[facility] DEPT [facility] Provider Note   CSN: [id number] Arrival date & time: 03/07/17  1435     History   Chief Complaint Chief Complaint  Patient presents with  . Fever  . Emesis    HPI Gerald Holt is a 83 y.o. male.  Patient is a 83 year old male with past medical history of diabetes, prior CVA, urinary retention, and A. fib. He presents today for evaluation of vomiting. According to the patient's wife at bedside, he has vomited twice this morning which she reports is extremely unusual for him. She reports that he vomited yesterday's lunch even notes he ate breakfast this morning. The patient denies to me he is experiencing any chest pain or abdominal pain. He denies any fevers or chills.  He was recently hospitalized for a urinary tract infection.   The history is provided by the patient.  Emesis   This is a new problem. Episode onset: This morning. Episode frequency: Twice. The problem has been resolved. The emesis has an appearance of stomach contents. There has been no fever. Pertinent negatives include no abdominal pain, no chills, no cough, no diarrhea, no fever and no headaches.    Past Medical History:  Diagnosis Date  . A-fib ([place])   . Cataracts, bilateral   . Chronic dermatitis   . Diabetes type 2, controlled ([place]) 2000  . History of chicken pox   . Mumps    Adult  . Retinopathy   . Shingles   . Stroke [facility]) Nov 2011  . Undescended testicle, unilateral    Right  . Whooping cough     Patient Active Problem List   Diagnosis Date Noted  . Nausea & vomiting 02/18/2017  . Weakness generalized 02/18/2017  . Weakness 02/18/2017  . Intermittent self-catheterization of bladder 11/22/2016  . Enlarged prostate 11/22/2016  . HCAP (healthcare-associated pneumonia) 10/31/2016  . Urinary retention 10/31/2016  . Diabetes mellitus with complication ([place])   . Lobar pneumonia ([place])   . Renal insufficiency 10/25/2016  . Encounter for therapeutic drug monitoring  10/20/2016  . Hx: UTI (urinary tract infection) 10/20/2016  . Hypothyroidism 09/15/2016  . Gastroesophageal reflux disease without esophagitis 07/09/2015  . Falls 06/22/2015  . Diabetes mellitus type II, uncontrolled ([place]) 05/29/2015  . Paroxysmal atrial fibrillation ([place]) 05/29/2015  . Hyperlipidemia 05/29/2015  . Hx of completed stroke 05/29/2015    Past Surgical History:  Procedure Laterality Date  . CARDIAC VALVE REPLACEMENT    . PRE-MALIGNANT / BENIGN SKIN LESION EXCISION    . TESTICLE SURGERY     Right, undescended  . TOOTH EXTRACTION         Home Medications    Prior to Admission medications   Medication Sig Start Date End Date Taking? Authorizing Provider  aspirin 81 MG tablet Take 1 tablet (81 mg total) by mouth daily. 06/22/15   Jerline Pain, MD  Black Elderberry,Berry-Flower, 575 MG CAPS Take 1 capsule by mouth 3 (three) times daily with meals.    [provider]  Calcium Carbonate Antacid (ALKA-SELTZER ANTACID PO) Take 2 tablets by mouth daily as needed (for indegestion).     [provider]  Cranberry 1000 MG CAPS Take 2 capsules by mouth daily.    [provider]  cyanocobalamin (,VITAMIN B-12,) 1000 MCG/ML injection Inject 1000 mcg intramuscular daily for 7 days, then once a week for 4 weeks, then once a month for a year Patient taking differently: Inject 1,000 mcg into the muscle every 30 (thirty) days.  04/02/16  Metta Clines R, DO  docusate sodium (COLACE) 100 MG capsule Take 200 mg by mouth daily as needed for mild constipation.     [provider]  Eyelid Cleansers (STERILID EX) Apply 1 application topically See admin instructions. Eye Wash: Once Daily     [provider]  glucose blood test strip One Touch Verio strips Use as instructed to check fasting sugars once daily .Dx:E11.9 11/20/16   [name], PA-C  levothyroxine (SYNTHROID, LEVOTHROID) 50 MCG tablet Take 1 tablet (50 mcg total) by mouth daily.  04/09/16   [name], PA-C  lisinopril (PRINIVIL,ZESTRIL) 2.5 MG tablet Take 1 tablet by mouth daily. 12/02/16   [provider]  lovastatin (MEVACOR) 10 MG tablet TAKE 1 TABLET AT BEDTIME 02/27/17   [name], PA-C  metFORMIN (GLUCOPHAGE-XR) 500 MG 24 hr tablet TAKE 2 TABLETS WITH BREAKFAST AND AT BEDTIME 02/27/17   [name], PA-C  metoprolol tartrate (LOPRESSOR) 25 MG tablet Take 0.5 tablets (12.5 mg total) by mouth 2 (two) times daily. 04/09/16   [name], PA-C  Miconazole Nitrate (TRIPLE PASTE AF) 2 % OINT Apply 1 application topically daily as needed (mouth care).     [provider]  Omega-3 Fatty Acids (SB OMEGA-3 FISH OIL) 1000 MG CAPS Take 1,000 mg by mouth 2 (two) times daily. Reported on 10/06/2015    [provider]  OVER THE COUNTER MEDICATION Take 1 capsule by mouth 2 (two) times daily. OTC Focus Select    [provider]  triamcinolone cream (KENALOG) 0.1 % Apply 1 application topically daily as needed (for itching).  02/01/16   [provider]    Family History Family History  Problem Relation Age of Onset  . Pneumonia Mother 69       Deceased  . GI Bleed Father 35       Deceased - Ulcers  . Diabetes Cousin   . Diabetes Brother   . Cancer Paternal Aunt     Social History Social History  Substance Use Topics  . Smoking status: Former Research scientist (life sciences)  . Smokeless tobacco: Never Used     Comment: Hasn't smoked since age 83  . Alcohol use No     Allergies   Flomax [tamsulosin hcl]; Shellfish allergy; Aspirin-dipyridamole er; and Other   Review of Systems Review of Systems  Constitutional: Negative for chills and fever.  Respiratory: Negative for cough.   Gastrointestinal: Positive for vomiting. Negative for abdominal pain and diarrhea.  Neurological: Negative for headaches.  All other systems reviewed and are negative.    Physical Exam Updated Vital Signs BP 116/65 (BP Location: Left Arm)    Pulse 77   Temp 98.5 F (36.9 C) (Oral)   Resp 18   SpO2 94%   Physical Exam  Constitutional: He is oriented to person, place, and time. He appears well-developed and well-nourished. No distress.  HENT:  Head: Normocephalic and atraumatic.  Mouth/Throat: Oropharynx is clear and moist.  Eyes: EOM are normal. Pupils are equal, round, and reactive to light.  Neck: Normal range of motion. Neck supple.  Cardiovascular: Normal rate and regular rhythm.  Exam reveals no friction rub.   No murmur heard. Pulmonary/Chest: Effort normal and breath sounds normal. No respiratory distress. He has no wheezes. He has no rales.  Abdominal: Soft. Bowel sounds are normal. He exhibits no distension. There is no tenderness.  Musculoskeletal: Normal range of motion. He exhibits no edema.  Neurological: He is alert and oriented  to person, place, and time. No cranial nerve deficit. He exhibits normal muscle tone. Coordination normal.  Skin: Skin is warm and dry. He is not diaphoretic.  Nursing note and vitals reviewed.    ED Treatments / Results  Labs (all labs ordered are listed, but only abnormal results are displayed) Labs Reviewed  URINALYSIS, ROUTINE W REFLEX MICROSCOPIC  COMPREHENSIVE METABOLIC PANEL  CBC WITH DIFFERENTIAL/PLATELET  TROPONIN I    EKG  EKG Interpretation None       Radiology No results found.  Procedures Procedures (including critical care time)  Medications Ordered in ED Medications  sodium chloride 0.9 % bolus 500 mL (not administered)  ondansetron (ZOFRAN) injection 4 mg (not administered)     Initial Impression / Assessment and Plan / ED Course  I have reviewed the triage vital signs and the nursing notes.  Pertinent labs & imaging results that were available during my care of the patient were reviewed by me and considered in my medical decision making (see chart for details).  Patient is an elderly male with past medical history as outlined in the history  of present illness. He presents with vomiting that started this morning. I am uncertain as to the etiology of his vomiting, however he does have a urinalysis that is suggestive of a urinary tract infection along with evidence for enteritis on his abdominal x-rays. He is also found have a white count of 19,000. Given the patient's advanced age and unclear cause of his leukocytosis, I feel as though he should be admitted for observation, hydration, and further workup. I've discussed this case with Dr. Wynelle Cleveland who agrees to admit. He was given Rocephin here prior to transfer.  Final Clinical Impressions(s) / ED Diagnoses   Final diagnoses:  None    New Prescriptions New Prescriptions   No medications on file     Veryl Speak, MD 03/07/17 2139

## 2017-03-07 NOTE — ED Notes (Signed)
Pt on monitor 

## 2017-03-07 NOTE — Telephone Encounter (Signed)
Patient has agreed to ER assessment. Will keep a track on this.

## 2017-03-07 NOTE — Telephone Encounter (Signed)
Please advise 

## 2017-03-08 ENCOUNTER — Encounter (HOSPITAL_COMMUNITY): Payer: Self-pay

## 2017-03-08 ENCOUNTER — Observation Stay (HOSPITAL_COMMUNITY): Payer: Medicare Other

## 2017-03-08 DIAGNOSIS — Z87440 Personal history of urinary (tract) infections: Secondary | ICD-10-CM | POA: Diagnosis not present

## 2017-03-08 DIAGNOSIS — Z7984 Long term (current) use of oral hypoglycemic drugs: Secondary | ICD-10-CM | POA: Diagnosis not present

## 2017-03-08 DIAGNOSIS — Z87891 Personal history of nicotine dependence: Secondary | ICD-10-CM | POA: Diagnosis not present

## 2017-03-08 DIAGNOSIS — E785 Hyperlipidemia, unspecified: Secondary | ICD-10-CM | POA: Diagnosis present

## 2017-03-08 DIAGNOSIS — Z7982 Long term (current) use of aspirin: Secondary | ICD-10-CM | POA: Diagnosis not present

## 2017-03-08 DIAGNOSIS — Z886 Allergy status to analgesic agent status: Secondary | ICD-10-CM | POA: Diagnosis not present

## 2017-03-08 DIAGNOSIS — E871 Hypo-osmolality and hyponatremia: Secondary | ICD-10-CM | POA: Diagnosis present

## 2017-03-08 DIAGNOSIS — R112 Nausea with vomiting, unspecified: Secondary | ICD-10-CM | POA: Diagnosis not present

## 2017-03-08 DIAGNOSIS — I480 Paroxysmal atrial fibrillation: Secondary | ICD-10-CM | POA: Diagnosis present

## 2017-03-08 DIAGNOSIS — K5900 Constipation, unspecified: Secondary | ICD-10-CM | POA: Diagnosis present

## 2017-03-08 DIAGNOSIS — K219 Gastro-esophageal reflux disease without esophagitis: Secondary | ICD-10-CM | POA: Diagnosis present

## 2017-03-08 DIAGNOSIS — Z79899 Other long term (current) drug therapy: Secondary | ICD-10-CM | POA: Diagnosis not present

## 2017-03-08 DIAGNOSIS — R933 Abnormal findings on diagnostic imaging of other parts of digestive tract: Secondary | ICD-10-CM | POA: Diagnosis not present

## 2017-03-08 DIAGNOSIS — N3000 Acute cystitis without hematuria: Secondary | ICD-10-CM

## 2017-03-08 DIAGNOSIS — A419 Sepsis, unspecified organism: Secondary | ICD-10-CM | POA: Diagnosis present

## 2017-03-08 DIAGNOSIS — K8020 Calculus of gallbladder without cholecystitis without obstruction: Secondary | ICD-10-CM | POA: Diagnosis not present

## 2017-03-08 DIAGNOSIS — Z8673 Personal history of transient ischemic attack (TIA), and cerebral infarction without residual deficits: Secondary | ICD-10-CM | POA: Diagnosis not present

## 2017-03-08 DIAGNOSIS — E039 Hypothyroidism, unspecified: Secondary | ICD-10-CM | POA: Diagnosis present

## 2017-03-08 DIAGNOSIS — Z888 Allergy status to other drugs, medicaments and biological substances status: Secondary | ICD-10-CM | POA: Diagnosis not present

## 2017-03-08 DIAGNOSIS — Z833 Family history of diabetes mellitus: Secondary | ICD-10-CM | POA: Diagnosis not present

## 2017-03-08 DIAGNOSIS — I10 Essential (primary) hypertension: Secondary | ICD-10-CM | POA: Diagnosis present

## 2017-03-08 DIAGNOSIS — Z952 Presence of prosthetic heart valve: Secondary | ICD-10-CM | POA: Diagnosis not present

## 2017-03-08 DIAGNOSIS — D649 Anemia, unspecified: Secondary | ICD-10-CM | POA: Diagnosis present

## 2017-03-08 DIAGNOSIS — Z66 Do not resuscitate: Secondary | ICD-10-CM | POA: Diagnosis present

## 2017-03-08 DIAGNOSIS — E11319 Type 2 diabetes mellitus with unspecified diabetic retinopathy without macular edema: Secondary | ICD-10-CM | POA: Diagnosis present

## 2017-03-08 DIAGNOSIS — Z91013 Allergy to seafood: Secondary | ICD-10-CM | POA: Diagnosis not present

## 2017-03-08 LAB — BASIC METABOLIC PANEL
Anion gap: 6 (ref 5–15)
BUN: 15 mg/dL (ref 6–20)
CALCIUM: 8.5 mg/dL — AB (ref 8.9–10.3)
CHLORIDE: 98 mmol/L — AB (ref 101–111)
CO2: 26 mmol/L (ref 22–32)
CREATININE: 0.95 mg/dL (ref 0.61–1.24)
GFR calc non Af Amer: >60 mL/min (ref >60)
Glucose, Bld: 138 mg/dL — ABNORMAL HIGH (ref 65–99)
Potassium: 3.7 mmol/L (ref 3.5–5.1)
Sodium: 130 mmol/L — ABNORMAL LOW (ref 135–145)

## 2017-03-08 LAB — GLUCOSE, CAPILLARY
GLUCOSE-CAPILLARY: 137 mg/dL — AB (ref 65–99)
GLUCOSE-CAPILLARY: 169 mg/dL — AB (ref 65–99)
GLUCOSE-CAPILLARY: 202 mg/dL — AB (ref 65–99)
Glucose-Capillary: 140 mg/dL — ABNORMAL HIGH (ref 65–99)
Glucose-Capillary: 190 mg/dL — ABNORMAL HIGH (ref 65–99)

## 2017-03-08 LAB — CBC
HCT: 31.8 % — ABNORMAL LOW (ref 39.0–52.0)
Hemoglobin: 10.6 g/dL — ABNORMAL LOW (ref 13.0–17.0)
MCH: 28 pg (ref 26.0–34.0)
MCHC: 33.3 g/dL (ref 30.0–36.0)
MCV: 84.1 fL (ref 78.0–100.0)
PLATELETS: 191 10*3/uL (ref 150–400)
RBC: 3.78 MIL/uL — AB (ref 4.22–5.81)
RDW: 14.1 % (ref 11.5–15.5)
WBC: 12.2 10*3/uL — ABNORMAL HIGH (ref 4.0–10.5)

## 2017-03-08 LAB — LACTIC ACID, PLASMA
LACTIC ACID, VENOUS: 3.8 mmol/L — AB (ref 0.5–1.9)
LACTIC ACID, VENOUS: 1.9 mmol/L (ref 0.5–1.9)
Lactic Acid, Venous: 1.5 mmol/L (ref 0.5–1.9)

## 2017-03-08 LAB — PROCALCITONIN: Procalcitonin: 0.1 ng/mL

## 2017-03-08 LAB — PROTIME-INR
INR: 1.11
PROTHROMBIN TIME: 14.3 s (ref 11.4–15.2)

## 2017-03-08 MED ORDER — SODIUM CHLORIDE 0.9 % IV BOLUS (SEPSIS)
1000.0000 mL | Freq: Once | INTRAVENOUS | Status: AC
  Administered 2017-03-08: 1000 mL via INTRAVENOUS

## 2017-03-08 MED ORDER — PANTOPRAZOLE SODIUM 40 MG PO TBEC
40.0000 mg | DELAYED_RELEASE_TABLET | Freq: Every day | ORAL | Status: DC
  Administered 2017-03-08 – 2017-03-09 (×2): 40 mg via ORAL
  Filled 2017-03-08 (×2): qty 1

## 2017-03-08 MED ORDER — ENOXAPARIN SODIUM 30 MG/0.3ML ~~LOC~~ SOLN
30.0000 mg | SUBCUTANEOUS | Status: DC
  Administered 2017-03-08 – 2017-03-09 (×2): 30 mg via SUBCUTANEOUS
  Filled 2017-03-08 (×2): qty 0.3

## 2017-03-08 MED ORDER — SENNOSIDES 8.8 MG/5ML PO SYRP
10.0000 mL | ORAL_SOLUTION | Freq: Two times a day (BID) | ORAL | Status: DC
  Administered 2017-03-08 (×2): 10 mL via ORAL
  Filled 2017-03-08 (×3): qty 10

## 2017-03-08 MED ORDER — IOPAMIDOL (ISOVUE-300) INJECTION 61%
INTRAVENOUS | Status: AC
  Administered 2017-03-08: 100 mL
  Filled 2017-03-08: qty 100

## 2017-03-08 MED ORDER — POLYETHYLENE GLYCOL 3350 17 G PO PACK
17.0000 g | PACK | Freq: Every day | ORAL | Status: DC
  Administered 2017-03-08: 17 g via ORAL
  Filled 2017-03-08 (×2): qty 1

## 2017-03-08 NOTE — Care Management Obs Status (Signed)
[facility] NOTIFICATION   Patient Details  Name: Gerald Holt MRN: [id number] Date of Birth: Aug 29, 1924   Medicare Observation Status Notification Given:  Yes    Ninfa Meeker, RN 03/08/2017, 11:30 AM

## 2017-03-08 NOTE — Progress Notes (Signed)
Pt back from CT

## 2017-03-08 NOTE — Progress Notes (Signed)
On call notified of lactic acid 3.8.  Bolus order given. Patient currently going to CT scan for abd.

## 2017-03-08 NOTE — Consult Note (Signed)
Consult Note for Gerald Holt  Reason for Consult: Nausea and vomiting Referring Physician: [facility]  Gerald Holt HPI: This is a 83 year old male with multiple medical problems admitted for two episodes of nausea and vomiting.  He was hospitalized earlier in May for nausea and vomiting and at that time he was noted to have an UTI, however, no clear etiology was identified.  Per the patient's friend, the patient's wife states that he never has issues with nausea and vomiting.  These symptoms are uncharacteristic and concerning for her, which prompted the admission.  The patient has to undergo in and out catherizations four times per day.  He denies any chronic issues with GERD, but he thinks he may be having some problems with his bowel movements.  The ABM CT scan was significant for a moderate amount of retained stool in the colon and rectum.  No reports of any fever or change in his mental status.  Past Medical History:  Diagnosis Date  . A-fib ([place])   . Cataracts, bilateral   . Chronic dermatitis   . Diabetes type 2, controlled ([place]) 2000  . History of chicken pox   . Mumps    Adult  . Retinopathy   . Shingles   . Stroke [facility]) Nov 2011  . Undescended testicle, unilateral    Right  . Whooping cough     Past Surgical History:  Procedure Laterality Date  . CARDIAC VALVE REPLACEMENT    . PRE-MALIGNANT / BENIGN SKIN LESION EXCISION    . TESTICLE SURGERY     Right, undescended  . TOOTH EXTRACTION      Family History  Problem Relation Age of Onset  . Pneumonia Mother 52       Deceased  . Holt Bleed Father 11       Deceased - Ulcers  . Diabetes Cousin   . Diabetes Brother   . Cancer Paternal Aunt     Social History:  reports that he has quit smoking. He has never used smokeless tobacco. He reports that he does not drink alcohol or use drugs.  Allergies:  Allergies  Allergen Reactions  . Flomax [Tamsulosin Hcl] Other (See Comments)    Hypotension  . Shellfish  Allergy Anaphylaxis  . Aspirin-Dipyridamole Er Other (See Comments)    Headaches   . Other Other (See Comments)    Blood Thinner given in Rehab gave H/As - Not Coumadin/Headaches      Medications:  Scheduled: . aspirin  81 mg Oral Daily  . [START ON 03/22/2017] cyanocobalamin  1,000 mcg Intramuscular Q30 days  . enoxaparin (LOVENOX) injection  30 mg Subcutaneous Q24H  . insulin aspart  0-5 Units Subcutaneous QHS  . insulin aspart  0-9 Units Subcutaneous TID WC  . iopamidol      . levothyroxine  50 mcg Oral QAC breakfast  . lisinopril  2.5 mg Oral Daily  . metoprolol tartrate  12.5 mg Oral BID  . omega-3 acid ethyl esters  1,000 mg Oral Daily  . pantoprazole  40 mg Oral Daily  . polyethylene glycol  17 g Oral Daily  . pravastatin  10 mg Oral q1800  . sennosides  10 mL Oral BID  . sodium chloride flush  3 mL Intravenous Q12H   Continuous: . sodium chloride 100 mL/hr at 03/08/17 0036  . cefTRIAXone (ROCEPHIN)  IV      Results for orders placed or performed during the hospital encounter of 03/07/17 (from the past 24 hour(s))  Comprehensive metabolic panel     Status: Abnormal   Collection Time: 03/07/17  3:40 PM  Result Value Ref Range   Sodium 133 (L) 135 - 145 mmol/L   Potassium 4.5 3.5 - 5.1 mmol/L   Chloride 96 (L) 101 - 111 mmol/L   CO2 22 22 - 32 mmol/L   Glucose, Bld 132 (H) 65 - 99 mg/dL   BUN 20 6 - 20 mg/dL   Creatinine, Ser 1.00 0.61 - 1.24 mg/dL   Calcium 9.6 8.9 - 10.3 mg/dL   Total Protein 7.1 6.5 - 8.1 g/dL   Albumin 3.7 3.5 - 5.0 g/dL   AST 31 15 - 41 U/L   ALT 18 17 - 63 U/L   Alkaline Phosphatase 43 38 - 126 U/L   Total Bilirubin 1.0 0.3 - 1.2 mg/dL   GFR calc non Af Amer >60 >60 mL/min   GFR calc Af Amer >60 >60 mL/min   Anion gap 15 5 - 15  CBC with Differential     Status: Abnormal   Collection Time: 03/07/17  3:40 PM  Result Value Ref Range   WBC 19.3 (H) 4.0 - 10.5 K/uL   RBC 4.32 4.22 - 5.81 MIL/uL   Hemoglobin 12.5 (L) 13.0 - 17.0 g/dL    HCT 35.8 (L) 39.0 - 52.0 %   MCV 82.9 78.0 - 100.0 fL   MCH 28.9 26.0 - 34.0 pg   MCHC 34.9 30.0 - 36.0 g/dL   RDW 14.1 11.5 - 15.5 %   Platelets 225 150 - 400 K/uL   Neutrophils Relative % 84 %   Neutro Abs 16.2 (H) 1.7 - 7.7 K/uL   Lymphocytes Relative 8 %   Lymphs Abs 1.5 0.7 - 4.0 K/uL   Monocytes Relative 4 %   Monocytes Absolute 0.7 0.1 - 1.0 K/uL   Eosinophils Relative 4 %   Eosinophils Absolute 0.7 0.0 - 0.7 K/uL   Basophils Relative 0 %   Basophils Absolute 0.1 0.0 - 0.1 K/uL  Troponin I     Status: None   Collection Time: 03/07/17  3:40 PM  Result Value Ref Range   Troponin I <0.03 <0.03 ng/mL  Urinalysis, Routine w reflex microscopic     Status: Abnormal   Collection Time: 03/07/17  3:55 PM  Result Value Ref Range   Color, Urine YELLOW YELLOW   APPearance CLEAR CLEAR   Specific Gravity, Urine 1.013 1.005 - 1.030   pH 6.5 5.0 - 8.0   Glucose, UA NEGATIVE NEGATIVE mg/dL   Hgb urine dipstick NEGATIVE NEGATIVE   Bilirubin Urine NEGATIVE NEGATIVE   Ketones, ur 15 (A) NEGATIVE mg/dL   Protein, ur NEGATIVE NEGATIVE mg/dL   Nitrite NEGATIVE NEGATIVE   Leukocytes, UA TRACE (A) NEGATIVE  Urinalysis, Microscopic (reflex)     Status: Abnormal   Collection Time: 03/07/17  3:55 PM  Result Value Ref Range   RBC / HPF 0-5 0 - 5 RBC/hpf   WBC, UA 6-30 0 - 5 WBC/hpf   Bacteria, UA MANY (A) NONE SEEN   Squamous Epithelial / LPF 0-5 (A) NONE SEEN   Mucous PRESENT   Lipase, blood     Status: None   Collection Time: 03/07/17 10:56 PM  Result Value Ref Range   Lipase 25 11 - 51 U/L  Lactic acid, plasma     Status: Abnormal   Collection Time: 03/07/17 10:56 PM  Result Value Ref Range   Lactic Acid, Venous 3.8 (HH) 0.5 - 1.9 mmol/L  Basic metabolic panel     Status: Abnormal   Collection Time: 03/08/17  3:00 AM  Result Value Ref Range   Sodium 130 (L) 135 - 145 mmol/L   Potassium 3.7 3.5 - 5.1 mmol/L   Chloride 98 (L) 101 - 111 mmol/L   CO2 26 22 - 32 mmol/L   Glucose, Bld  138 (H) 65 - 99 mg/dL   BUN 15 6 - 20 mg/dL   Creatinine, Ser 0.95 0.61 - 1.24 mg/dL   Calcium 8.5 (L) 8.9 - 10.3 mg/dL   GFR calc non Af Amer >60 >60 mL/min   GFR calc Af Amer >60 >60 mL/min   Anion gap 6 5 - 15  CBC     Status: Abnormal   Collection Time: 03/08/17  3:00 AM  Result Value Ref Range   WBC 12.2 (H) 4.0 - 10.5 K/uL   RBC 3.78 (L) 4.22 - 5.81 MIL/uL   Hemoglobin 10.6 (L) 13.0 - 17.0 g/dL   HCT 31.8 (L) 39.0 - 52.0 %   MCV 84.1 78.0 - 100.0 fL   MCH 28.0 26.0 - 34.0 pg   MCHC 33.3 30.0 - 36.0 g/dL   RDW 14.1 11.5 - 15.5 %   Platelets 191 150 - 400 K/uL  Protime-INR     Status: None   Collection Time: 03/08/17  3:00 AM  Result Value Ref Range   Prothrombin Time 14.3 11.4 - 15.2 seconds   INR 1.11   Lactic acid, plasma     Status: None   Collection Time: 03/08/17  3:00 AM  Result Value Ref Range   Lactic Acid, Venous 1.9 0.5 - 1.9 mmol/L  Procalcitonin     Status: None   Collection Time: 03/08/17  3:00 AM  Result Value Ref Range   Procalcitonin 0.10 ng/mL  Glucose, capillary     Status: Abnormal   Collection Time: 03/08/17  3:24 AM  Result Value Ref Range   Glucose-Capillary 137 (H) 65 - 99 mg/dL  Lactic acid, plasma     Status: None   Collection Time: 03/08/17  6:09 AM  Result Value Ref Range   Lactic Acid, Venous 1.5 0.5 - 1.9 mmol/L  Glucose, capillary     Status: Abnormal   Collection Time: 03/08/17  6:51 AM  Result Value Ref Range   Glucose-Capillary 140 (H) 65 - 99 mg/dL     Ct Abdomen Pelvis W Contrast  Result Date: 03/08/2017 CLINICAL DATA:  Nausea vomiting and constipation EXAM: CT ABDOMEN AND PELVIS WITH CONTRAST TECHNIQUE: Multidetector CT imaging of the abdomen and pelvis was performed using the standard protocol following bolus administration of intravenous contrast. CONTRAST:  118mL ISOVUE-300 IOPAMIDOL (ISOVUE-300) INJECTION 61% COMPARISON:  03/07/2017 radiograph FINDINGS: Lower chest: Lung bases demonstrate streaky dependent atelectasis. No  pleural effusion. Coronary artery calcifications. No pericardial effusion. Mitral annular calcification. Hepatobiliary: Calcified gallstone. No biliary dilatation. No focal hepatic abnormality Pancreas: Unremarkable. No pancreatic ductal dilatation or surrounding inflammatory changes. Spleen: Normal in size without focal abnormality. Adrenals/Urinary Tract: Adrenal glands are unremarkable. Kidneys are normal, without renal calculi, focal lesion, or hydronephrosis. Foley catheter in the bladder with small amount of air in the bladder. Stomach/Bowel: The stomach is nonenlarged. There is no evidence for a bowel obstruction. No significant colon wall thickening. Moderate stool in the colon. Normal appendix. Mild feces impaction in the rectum Vascular/Lymphatic: Aortic atherosclerosis. No enlarged abdominal or pelvic lymph nodes. Reproductive: No masses Other: No free air or free fluid. Musculoskeletal: Degenerative changes of the  spine. No acute or suspicious bone lesion. IMPRESSION: 1. Negative for bowel obstruction or bowel wall thickening. Moderate stool in the colon with moderate feces retention in the rectum. 2. Gallstones Electronically Signed   By: Donavan Foil M.D.   On: 03/08/2017 03:08   Dg Abd Acute W/chest  Result Date: 03/07/2017 CLINICAL DATA:  Vomiting and fever. EXAM: DG ABDOMEN ACUTE W/ 1V CHEST COMPARISON:  Chest x-ray on 02/18/2017 FINDINGS: There is no evidence of pulmonary edema, consolidation, pneumothorax, nodule or pleural fluid. The heart size is normal. Abdominal films show no evidence of acute bowel obstruction or free intraperitoneal air. There are a few air-fluid levels in small bowel which may be consistent with enteritis. Scattered stool throughout the colon without evidence of fecal impaction. Bony structures are unremarkable. The splenic artery is calcified. IMPRESSION: Scattered air-fluid levels in small bowel which may be consistent with enteritis. No evidence of small bowel  obstruction or free intraperitoneal air. Electronically Signed   By: Aletta Edouard M.D.   On: 03/07/2017 16:40    ROS:  As stated above in the HPI otherwise negative.  Blood pressure (!) 125/54, pulse 63, temperature 97.6 F (36.4 C), temperature source Oral, resp. rate 18, SpO2 97 %.    PE: Gen: NAD, Alert and Oriented HEENT:  [place]/AT, EOMI Neck: Supple, no LAD Lungs: CTA Bilaterally CV: RRR without M/G/R ABM: Soft, NTND, +BS Ext: No C/C/E  Assessment/Plan: 1) Nausea and vomiting. 2) History of UTI. 3) ? Constipation.   The patient appears to be stable clinically.  I cannot identify a source for his two episodes of nausea and vomiting.  His UA during this admission does not suggest a UTI.  There may be some issues with constipation.  He was able to have a bowel movement last evening and he feels better.    Plan: 1) Trial of a regular diet. 2) Follow clinically at this point.  Bree Heinzelman D 03/08/2017, 10:47 AM

## 2017-03-08 NOTE — Progress Notes (Addendum)
Patient ID: Gerald Holt, male   DOB: 02-28-1924, 83 y.o.   MRN: [id number]                                                                PROGRESS NOTE                                                                                                                                                                                                             Patient Demographics:    Gerald Holt, is a 83 y.o. male, DOB - 03-09-1924, YDX:[id number]  Admit date - 03/07/2017   Admitting Physician Ivor Costa, MD  Outpatient Primary MD for the patient is [name], PA-C  LOS - 0  Outpatient Specialists:     Chief Complaint  Patient presents with  . Fever  . Emesis       Brief Narrative    83 y.o. male with medical history significant of hypertension, hyperlipidemia, diabetes mellitus, atrial fibrillation  Not on anticoagulants, intermittent self catheterization of the bladder, who presents with nausea, vomiting and constipation.  Patient states that she started having nausea and vomiting sicne yesterday morning, no diarrhea. She denies abdominal  But reports burning sensation in epigastric area. He states that he has been constipated for 1 week. Patient does not have dysuria or burning on urination, but reports difficulty urinating. He needs to do self catheterization of the bladder. Patient does not  unilateral weakness.  ED Course: pt was found to have WBC 19.3, lactic acid is 3.8, negative troponin, lipase 25, UA with trace amount of leukocytes and many bacteria, creatinine normal, temperature 98.8 oxygen saturation 91-94% on room. CT-abdomen/pelvis showed moderate stool in the colon with moderate feces retention in the rectum, no SBO. Pt is placed on tele bed for obs    Subjective:    [name] today has been feeling better.  Denies fever, chills, dysuria, hematuria.   No headache, No chest pain, No abdominal pain - No Nausea, No new weakness tingling or numbness, No Cough -  SOB.    Assessment  & Plan :    Principal Problem:   Nausea & vomiting Active Problems:   Paroxysmal atrial fibrillation ([facility])   HLD (hyperlipidemia)   Hypothyroidism   Sepsis ([place])   Diabetes mellitus with complication (  [place])   UTI (urinary tract infection)   Essential hypertension   Constipation   Nausea & vomiting:  resolved Most likely due to constipation as evidenced by the CT scan of abdomen. No SBO by CT scan. Doing well on MiraLAX and senokot Spoke with Wife and she mentioned similar episode about 2 weeks ago Start protonix 40mg  po qday  Will consult GI regarding n/v at her request UGI series vs EGD NPO for now til seen by GI Appreciate their input.   Possible UTI and sepsis:  Continue rocephin Awaiting urine culture  Atrial Fibrillation: CHA2DS2-VASc Score is 6, needs oral anticoagulation, but patient is not on AC, unclear reason. May be due to old age and risk of fall. Heart rate is well controlled. Continue metoprolol  HLD:  Lovastatin  Hypothyroidism: Last TSH was on 1.0197, on 02/18/17 Continue home Synthroid  DM-II: Last A1c 7.2 on 03/05/17, fairly controled. Patient is taking metformin at home SSI  HTN: Lisinopril and metoprolol  Stroke: ASA, and lovastatin    DVT ppx: lovenox Code Status: DNR (I discussed with patient and explained the meaning of [place]. Patient wants to be DNR). Family Communication: None at bed side.  Disposition Plan:  Anticipate discharge back to previous home environment, likely home tomorrow if able to tolerate diet Consults called:  none Admission status: Obs / tele      Lab Results  Component Value Date   PLT 191 03/08/2017    Antibiotics  :  Rocephin 6/15=>  Anti-infectives    Start     Dose/Rate Route Frequency Ordered Stop   03/08/17 1800  cefTRIAXone (ROCEPHIN) 1 g in dextrose 5 % 50 mL IVPB     1 g 100 mL/hr over 30 Minutes Intravenous Every 24 hours 03/07/17 2232     03/07/17 1800   cefTRIAXone (ROCEPHIN) 1 g in dextrose 5 % 50 mL IVPB     1 g 100 mL/hr over 30 Minutes Intravenous  Once 03/07/17 1745 03/07/17 1855        Objective:   Vitals:   03/07/17 2030 03/07/17 2154 03/08/17 0130 03/08/17 0428  BP: 122/65 (!) 113/52 126/61 (!) 125/54  Pulse: 77 82 77 63  Resp:  18  18  Temp:  98.8 F (37.1 C)  97.6 F (36.4 C)  TempSrc:  Oral  Oral  SpO2: 94% 94%  97%    Wt Readings from Last 3 Encounters:  03/05/17 71.2 kg (157 lb)  02/18/17 70.1 kg (154 lb 9.6 oz)  01/22/17 72.6 kg (160 lb)     Intake/Output Summary (Last 24 hours) at 03/08/17 0814 Last data filed at 03/08/17 0600  Gross per 24 hour  Intake             2900 ml  Output             2650 ml  Net              250 ml     Physical Exam  Awake Alert, Oriented X 3, No new F.N deficits, Normal affect [place].AT,PERRAL Supple Neck,No JVD, No cervical lymphadenopathy appriciated.  Symmetrical Chest wall movement, Good air movement bilaterally, CTAB RRR,No Gallops,Rubs or new Murmurs, No Parasternal Heave +ve B.Sounds, Abd Soft, No tenderness, No organomegaly appriciated, No rebound - guarding or rigidity. No Cyanosis, Clubbing or edema, No new Rash or bruise      Data Review:    CBC  Recent Labs Lab 03/07/17 1540 03/08/17 0300  WBC 19.3*  12.2*  HGB 12.5* 10.6*  HCT 35.8* 31.8*  PLT 225 191  MCV 82.9 84.1  MCH 28.9 28.0  MCHC 34.9 33.3  RDW 14.1 14.1  LYMPHSABS 1.5  --   MONOABS 0.7  --   EOSABS 0.7  --   BASOSABS 0.1  --     Chemistries   Recent Labs Lab 03/07/17 1540 03/08/17 0300  NA 133* 130*  K 4.5 3.7  CL 96* 98*  CO2 22 26  GLUCOSE 132* 138*  BUN 20 15  CREATININE 1.00 0.95  CALCIUM 9.6 8.5*  AST 31  --   ALT 18  --   ALKPHOS 43  --   BILITOT 1.0  --    ------------------------------------------------------------------------------------------------------------------ No results for input(s): CHOL, HDL, LDLCALC, TRIG, CHOLHDL, LDLDIRECT in the last 72  hours.  Lab Results  Component Value Date   HGBA1C 7.2 (H) 03/05/2017   ------------------------------------------------------------------------------------------------------------------ No results for input(s): TSH, T4TOTAL, T3FREE, THYROIDAB in the last 72 hours.  Invalid input(s): FREET3 ------------------------------------------------------------------------------------------------------------------ No results for input(s): VITAMINB12, FOLATE, FERRITIN, TIBC, IRON, RETICCTPCT in the last 72 hours.  Coagulation profile  Recent Labs Lab 03/08/17 0300  INR 1.11    No results for input(s): DDIMER in the last 72 hours.  Cardiac Enzymes  Recent Labs Lab 03/07/17 1540  TROPONINI <0.03   ------------------------------------------------------------------------------------------------------------------ No results found for: BNP  Inpatient Medications  Scheduled Meds: . aspirin  81 mg Oral Daily  . [START ON 03/22/2017] cyanocobalamin  1,000 mcg Intramuscular Q30 days  . enoxaparin (LOVENOX) injection  30 mg Subcutaneous Q24H  . insulin aspart  0-5 Units Subcutaneous QHS  . insulin aspart  0-9 Units Subcutaneous TID WC  . iopamidol      . levothyroxine  50 mcg Oral QAC breakfast  . lisinopril  2.5 mg Oral Daily  . metoprolol tartrate  12.5 mg Oral BID  . omega-3 acid ethyl esters  1,000 mg Oral Daily  . polyethylene glycol  17 g Oral Daily  . pravastatin  10 mg Oral q1800  . sennosides  10 mL Oral BID  . sodium chloride flush  3 mL Intravenous Q12H   Continuous Infusions: . sodium chloride 100 mL/hr at 03/08/17 0036  . cefTRIAXone (ROCEPHIN)  IV     PRN Meds:.acetaminophen **OR** acetaminophen, docusate sodium, ondansetron (ZOFRAN) IV, triamcinolone cream, zolpidem  Micro Results No results found for this or any previous visit (from the past 240 hour(s)).  Radiology Reports Ct Abdomen Pelvis W Contrast  Result Date: 03/08/2017 CLINICAL DATA:  Nausea vomiting and  constipation EXAM: CT ABDOMEN AND PELVIS WITH CONTRAST TECHNIQUE: Multidetector CT imaging of the abdomen and pelvis was performed using the standard protocol following bolus administration of intravenous contrast. CONTRAST:  123mL ISOVUE-300 IOPAMIDOL (ISOVUE-300) INJECTION 61% COMPARISON:  03/07/2017 radiograph FINDINGS: Lower chest: Lung bases demonstrate streaky dependent atelectasis. No pleural effusion. Coronary artery calcifications. No pericardial effusion. Mitral annular calcification. Hepatobiliary: Calcified gallstone. No biliary dilatation. No focal hepatic abnormality Pancreas: Unremarkable. No pancreatic ductal dilatation or surrounding inflammatory changes. Spleen: Normal in size without focal abnormality. Adrenals/Urinary Tract: Adrenal glands are unremarkable. Kidneys are normal, without renal calculi, focal lesion, or hydronephrosis. Foley catheter in the bladder with small amount of air in the bladder. Stomach/Bowel: The stomach is nonenlarged. There is no evidence for a bowel obstruction. No significant colon wall thickening. Moderate stool in the colon. Normal appendix. Mild feces impaction in the rectum Vascular/Lymphatic: Aortic atherosclerosis. No enlarged abdominal or pelvic lymph nodes. Reproductive: No masses  Other: No free air or free fluid. Musculoskeletal: Degenerative changes of the spine. No acute or suspicious bone lesion. IMPRESSION: 1. Negative for bowel obstruction or bowel wall thickening. Moderate stool in the colon with moderate feces retention in the rectum. 2. Gallstones Electronically Signed   By: Donavan Foil M.D.   On: 03/08/2017 03:08   Dg Chest Port 1 View  Result Date: 02/18/2017 CLINICAL DATA:  83 year old male with weakness. History of diabetes. EXAM: PORTABLE CHEST 1 VIEW COMPARISON:  Chest radiograph dated 12/27/2016 FINDINGS: There is shallow inspiration with bibasilar atelectatic changes. No focal consolidation, pleural effusion, or pneumothorax. The cardiac  silhouette appears stable in similar to prior study. There is atherosclerotic calcification of the aortic arch. No acute osseous pathology. IMPRESSION: No active disease. Electronically Signed   By: Anner Crete M.D.   On: 02/18/2017 00:15   Dg Abd Acute W/chest  Result Date: 03/07/2017 CLINICAL DATA:  Vomiting and fever. EXAM: DG ABDOMEN ACUTE W/ 1V CHEST COMPARISON:  Chest x-ray on 02/18/2017 FINDINGS: There is no evidence of pulmonary edema, consolidation, pneumothorax, nodule or pleural fluid. The heart size is normal. Abdominal films show no evidence of acute bowel obstruction or free intraperitoneal air. There are a few air-fluid levels in small bowel which may be consistent with enteritis. Scattered stool throughout the colon without evidence of fecal impaction. Bony structures are unremarkable. The splenic artery is calcified. IMPRESSION: Scattered air-fluid levels in small bowel which may be consistent with enteritis. No evidence of small bowel obstruction or free intraperitoneal air. Electronically Signed   By: Aletta Edouard M.D.   On: 03/07/2017 16:40    Time Spent in minutes  30   Jani Gravel M.D on 03/08/2017 at 8:14 AM  Between 7am to 7pm - Pager - [phone]  After 7pm go to [email or web address] - password [facility]  [facility] -  Office  [phone]

## 2017-03-09 LAB — OSMOLALITY: OSMOLALITY: 292 mosm/kg (ref 275–295)

## 2017-03-09 LAB — SODIUM, URINE, RANDOM: Sodium, Ur: 47 mmol/L

## 2017-03-09 LAB — COMPREHENSIVE METABOLIC PANEL
ALBUMIN: 2.9 g/dL — AB (ref 3.5–5.0)
ALT: 12 U/L — AB (ref 17–63)
AST: 17 U/L (ref 15–41)
Alkaline Phosphatase: 41 U/L (ref 38–126)
Anion gap: 7 (ref 5–15)
BUN: 13 mg/dL (ref 6–20)
CHLORIDE: 105 mmol/L (ref 101–111)
CO2: 24 mmol/L (ref 22–32)
CREATININE: 1.06 mg/dL (ref 0.61–1.24)
Calcium: 8.8 mg/dL — ABNORMAL LOW (ref 8.9–10.3)
GFR calc Af Amer: >60 mL/min (ref >60)
GFR, EST NON AFRICAN AMERICAN: 59 mL/min — AB (ref >60)
Glucose, Bld: 184 mg/dL — ABNORMAL HIGH (ref 65–99)
POTASSIUM: 3.8 mmol/L (ref 3.5–5.1)
SODIUM: 136 mmol/L (ref 135–145)
Total Bilirubin: 0.6 mg/dL (ref 0.3–1.2)
Total Protein: 5.9 g/dL — ABNORMAL LOW (ref 6.5–8.1)

## 2017-03-09 LAB — OSMOLALITY, URINE: Osmolality, Ur: 168 mOsm/kg — ABNORMAL LOW (ref 300–900)

## 2017-03-09 LAB — CORTISOL: CORTISOL PLASMA: 13.3 ug/dL

## 2017-03-09 LAB — URINE CULTURE

## 2017-03-09 LAB — CBC
HCT: 31.3 % — ABNORMAL LOW (ref 39.0–52.0)
Hemoglobin: 10.2 g/dL — ABNORMAL LOW (ref 13.0–17.0)
MCH: 27.6 pg (ref 26.0–34.0)
MCHC: 32.6 g/dL (ref 30.0–36.0)
MCV: 84.6 fL (ref 78.0–100.0)
PLATELETS: 176 10*3/uL (ref 150–400)
RBC: 3.7 MIL/uL — AB (ref 4.22–5.81)
RDW: 14.2 % (ref 11.5–15.5)
WBC: 6.8 10*3/uL (ref 4.0–10.5)

## 2017-03-09 LAB — TSH: TSH: 3.741 u[IU]/mL (ref 0.350–4.500)

## 2017-03-09 LAB — GLUCOSE, CAPILLARY
GLUCOSE-CAPILLARY: 181 mg/dL — AB (ref 65–99)
Glucose-Capillary: 171 mg/dL — ABNORMAL HIGH (ref 65–99)

## 2017-03-09 MED ORDER — CEPHALEXIN 500 MG PO CAPS: 500.0000 mg | ORAL_CAPSULE | Freq: Two times a day (BID) | ORAL | 0 refills | Status: DC

## 2017-03-09 MED ORDER — PANTOPRAZOLE SODIUM 40 MG PO TBEC: 40.0000 mg | DELAYED_RELEASE_TABLET | Freq: Every day | ORAL | 0 refills | Status: DC

## 2017-03-09 MED ORDER — POLYETHYLENE GLYCOL 3350 17 G PO PACK: 17.0000 g | PACK | Freq: Every day | ORAL | 0 refills | Status: DC | PRN

## 2017-03-09 NOTE — Discharge Summary (Signed)
Gerald Holt, is a 83 y.o. male  DOB December 05, 1923  MRN [id number].  Admission date:  03/07/2017  Admitting Physician  Ivor Costa, MD  Discharge Date:  03/09/2017   Primary MD  [name], PA-C  Recommendations for primary care physician for things to follow:   Hyponatremia resolved w iv hydration.  Serum osm not suggestive of SIADH Check cmp in a 1week   Nausea & vomiting:resolved Most likely due to constipation as evidenced by the CT scan of abdomen. No SBO by CT scan. Doing well on MiraLAX and senokot Spoke with Wife and she mentioned similar episode about 2 weeks ago Continue protonix 40mg  po qday  If continues to have nausea then may need to stop metformin  Possible UTI and sepsis:  Received Rocephin iv x2 days Keflex 500mg  po bid x 5 days  Atrial Fibrillation: CHA2DS2-VASc Scoreis 6, needs oral anticoagulation, but patient is not on AC, unclear reason. May be due to old age and risk of fall. Heart rate is well controlled. Continue metoprolol. Please f/u with pcp  HLD: Lovastatin  Hypothyroidism: Last TSH was on 1.0197, on 02/18/17 Continue home Synthroid  DM-II:Last A1c 7.2 on 03/05/17, fairly controled. Patient is taking metformin at home SSI  HTN: Lisinopril and metoprolol  Stroke: ASA, and lovastatin  Anemia Check cbc in 1 week    Admission Diagnosis  Acute cystitis without hematuria [N30.00] Leukocytosis, unspecified type [D72.829] Non-intractable vomiting with nausea, unspecified vomiting type [R11.2]   Discharge Diagnosis  Acute cystitis without hematuria [N30.00] Leukocytosis, unspecified type [D72.829] Non-intractable vomiting with nausea, unspecified vomiting type [R11.2]    Principal Problem:   Nausea & vomiting Active Problems:   Paroxysmal atrial fibrillation (HCC)   HLD (hyperlipidemia)   Hypothyroidism   Sepsis ([place])   Diabetes  mellitus with complication ([place])   UTI (urinary tract infection)   Essential hypertension   Constipation      Past Medical History:  Diagnosis Date  . A-fib ([place])   . Cataracts, bilateral   . Chronic dermatitis   . Diabetes type 2, controlled ([place]) 2000  . History of chicken pox   . Mumps    Adult  . Retinopathy   . Shingles   . Stroke [facility]) Nov 2011  . Undescended testicle, unilateral    Right  . Whooping cough     Past Surgical History:  Procedure Laterality Date  . CARDIAC VALVE REPLACEMENT    . PRE-MALIGNANT / BENIGN SKIN LESION EXCISION    . TESTICLE SURGERY     Right, undescended  . TOOTH EXTRACTION         HPI  from the history and physical done on the day of admission:     83 y.o.malewith medical history significant of hypertension, hyperlipidemia, diabetes mellitus, atrial fibrillation Not on anticoagulants, intermittent self catheterization of the bladder, who presents with nausea, vomiting and constipation.  Patient states that she started having nausea and vomiting sicne yesterday morning, no diarrhea. She denies abdominal But reports burning  sensation in epigastric area. He states that he has been constipated for 1 week. Patient does not have dysuria or burning on urination, but reports difficulty urinating. He needs to do self catheterization of the bladder. Patient does not unilateral weakness.  ED Course:pt was found to have WBC 19.3, lactic acid is 3.8, negative troponin, lipase 25, UA with trace amount of leukocytes and many bacteria,creatinine normal, temperature 98.8 oxygen saturation 91-94% on room. CT-abdomen/pelvis showed moderate stool in the colon with moderate feces retention in the rectum, no SBO. Pt [facility] Course:     Pt admitted for n/v,  hyponatremia, and constipation. Pt was treated for constipation and had a good BM.  Then hyponatremia resolved with iv hydration, and his nausea and  vomitting was self limited.  He was found to have UTI and started on rocephin and his leukocytosis resolved.  Pt wbc 6.8  On 6/17.  Pt noted to have mild anemia which is not new.  No brbpr, no black stool.  Pt has been tolerating food without difficulty and was seen by GI for nausea which was self limited and didn't recommend any EGD at this time.  Pt continues to tolerate protonix.  Denies dysuria, hematuria.   Pt stable and will be discharge to home.  Follow UP  Follow-up Information    [name], PA-C Follow up in 1 week(s).   Specialty:  Family Medicine Contact information: [name] [place] [address] [phone]            Consults obtained - GI  Discharge Condition: stable  Diet and Activity recommendation: See Discharge Instructions below  Discharge Instructions         Discharge Medications     Allergies as of 03/09/2017      Reactions   Flomax [tamsulosin Hcl] Other (See Comments)   Hypotension   Shellfish Allergy Anaphylaxis   Aspirin-dipyridamole Er Other (See Comments)   Headaches   Other Other (See Comments)   Blood Thinner given in Rehab gave H/As - Not Coumadin/Headaches      Medication List    TAKE these medications   ALKA-SELTZER ANTACID PO Take 2 tablets by mouth daily as needed (for indegestion).   aspirin 81 MG tablet Take 1 tablet (81 mg total) by mouth daily.   Black Elderberry(Berry-Flower) 575 MG Caps Take 1 capsule by mouth 3 (three) times daily with meals.   Cranberry 1000 MG Caps Take 2 capsules by mouth daily.   cyanocobalamin 1000 MCG/ML injection Commonly known as:  (VITAMIN B-12) Inject 1000 mcg intramuscular daily for 7 days, then once a week for 4 weeks, then once a month for a year What changed:  how much to take  how to take this  when to take this  additional instructions   docusate sodium 100 MG capsule Commonly known as:  COLACE Take 200 mg by mouth daily as needed for mild  constipation.   glucose blood test strip One Touch Verio strips Use as instructed to check fasting sugars once daily .Dx:E11.9   levothyroxine 50 MCG tablet Commonly known as:  SYNTHROID, LEVOTHROID Take 1 tablet (50 mcg total) by mouth daily.   lisinopril 2.5 MG tablet Commonly known as:  PRINIVIL,ZESTRIL Take 1 tablet by mouth daily.   lovastatin 10 MG tablet Commonly known as:  MEVACOR TAKE 1 TABLET AT BEDTIME   metFORMIN 500 MG 24 hr tablet Commonly known as:  GLUCOPHAGE-XR TAKE 2 TABLETS WITH BREAKFAST AND AT BEDTIME   metoprolol tartrate 25 MG tablet Commonly known as:  LOPRESSOR Take 0.5 tablets (12.5 mg total) by mouth 2 (two) times daily.   OVER THE COUNTER MEDICATION Take 1 capsule by mouth 2 (two) times daily. OTC Focus Select   pantoprazole 40 MG tablet Commonly known as:  PROTONIX Take 1 tablet (40 mg total) by mouth daily. Start taking on:  03/10/2017   polyethylene glycol packet Commonly known as:  MIRALAX / GLYCOLAX Take 17 g by mouth daily as needed for moderate constipation or severe constipation.   SB OMEGA-3 FISH OIL 1000 MG Caps Take 1,000 mg by mouth daily. Reported on 10/06/2015   STERILID EX Apply 1 application topically See admin instructions. Eye Wash: Once Daily   triamcinolone cream 0.1 % Commonly known as:  KENALOG Apply 1 application topically daily as needed (for itching).   TRIPLE PASTE AF 2 % Oint Generic drug:  Miconazole Nitrate Apply 1 application topically daily as needed (mouth care).       Major procedures and Radiology Reports - PLEASE review detailed and final reports for all details, in brief -      Ct Abdomen Pelvis W Contrast  Result Date: 03/08/2017 CLINICAL DATA:  Nausea vomiting and constipation EXAM: CT ABDOMEN AND PELVIS WITH CONTRAST TECHNIQUE: Multidetector CT imaging of the abdomen and pelvis was performed using the standard protocol following bolus administration of intravenous contrast. CONTRAST:  190mL  ISOVUE-300 IOPAMIDOL (ISOVUE-300) INJECTION 61% COMPARISON:  03/07/2017 radiograph FINDINGS: Lower chest: Lung bases demonstrate streaky dependent atelectasis. No pleural effusion. Coronary artery calcifications. No pericardial effusion. Mitral annular calcification. Hepatobiliary: Calcified gallstone. No biliary dilatation. No focal hepatic abnormality Pancreas: Unremarkable. No pancreatic ductal dilatation or surrounding inflammatory changes. Spleen: Normal in size without focal abnormality. Adrenals/Urinary Tract: Adrenal glands are unremarkable. Kidneys are normal, without renal calculi, focal lesion, or hydronephrosis. Foley catheter in the bladder with small amount of air in the bladder. Stomach/Bowel: The stomach is nonenlarged. There is no evidence for a bowel obstruction. No significant colon wall thickening. Moderate stool in the colon. Normal appendix. Mild feces impaction in the rectum Vascular/Lymphatic: Aortic atherosclerosis. No enlarged abdominal or pelvic lymph nodes. Reproductive: No masses Other: No free air or free fluid. Musculoskeletal: Degenerative changes of the spine. No acute or suspicious bone lesion. IMPRESSION: 1. Negative for bowel obstruction or bowel wall thickening. Moderate stool in the colon with moderate feces retention in the rectum. 2. Gallstones Electronically Signed   By: Donavan Foil M.D.   On: 03/08/2017 03:08   Dg Chest Port 1 View  Result Date: 02/18/2017 CLINICAL DATA:  83 year old male with weakness. History of diabetes. EXAM: PORTABLE CHEST 1 VIEW COMPARISON:  Chest radiograph dated 12/27/2016 FINDINGS: There is shallow inspiration with bibasilar atelectatic changes. No focal consolidation, pleural effusion, or pneumothorax. The cardiac silhouette appears stable in similar to prior study. There is atherosclerotic calcification of the aortic arch. No acute osseous pathology. IMPRESSION: No active disease. Electronically Signed   By: Anner Crete M.D.   On:  02/18/2017 00:15   Dg Abd Acute W/chest  Result Date: 03/07/2017 CLINICAL DATA:  Vomiting and fever. EXAM: DG ABDOMEN ACUTE W/ 1V CHEST COMPARISON:  Chest x-ray on 02/18/2017 FINDINGS: There is no evidence of pulmonary edema, consolidation, pneumothorax, nodule or pleural fluid. The heart size is normal. Abdominal films show no evidence of acute bowel obstruction or free intraperitoneal air. There are a few air-fluid levels in small bowel  which may be consistent with enteritis. Scattered stool throughout the colon without evidence of fecal impaction. Bony structures are unremarkable. The splenic artery is calcified. IMPRESSION: Scattered air-fluid levels in small bowel which may be consistent with enteritis. No evidence of small bowel obstruction or free intraperitoneal air. Electronically Signed   By: Aletta Edouard M.D.   On: 03/07/2017 16:40    Micro Results     Recent Results (from the past 240 hour(s))  Urine culture     Status: Abnormal   Collection Time: 03/07/17  9:30 PM  Result Value Ref Range Status   Specimen Description URINE, CATHETERIZED  Final   Special Requests NONE  Final   Culture (A)  Final    <10,000 COLONIES/mL INSIGNIFICANT GROWTH Performed at [facility], [address] [place], [place] [address]    Report Status 03/09/2017 FINAL  Final  Culture, blood (Routine X 2) w Reflex to ID Panel     Status: None (Preliminary result)   Collection Time: 03/07/17 10:55 PM  Result Value Ref Range Status   Specimen Description BLOOD RIGHT ARM  Final   Special Requests   Final    BOTTLES DRAWN AEROBIC AND ANAEROBIC Blood Culture adequate volume   Culture NO GROWTH 2 DAYS  Final   Report Status PENDING  Incomplete  Culture, blood (Routine X 2) w Reflex to ID Panel     Status: None (Preliminary result)   Collection Time: 03/07/17 11:00 PM  Result Value Ref Range Status   Specimen Description BLOOD RIGHT WRIST  Final   Special Requests   Final    BOTTLES DRAWN AEROBIC  ONLY Blood Culture adequate volume   Culture NO GROWTH 2 DAYS  Final   Report Status PENDING  Incomplete       Today   Subjective    Gerald Holt today has no n/v, abd pain, diarrhea, brbpr, black stool.   headache,no chest, ,no new weakness tingling or numbness, feels much better wants to go home today.    Objective   Blood pressure (!) 126/54, pulse 67, temperature 97.5 F (36.4 C), temperature source Oral, resp. rate 18, SpO2 97 %.   Intake/Output Summary (Last 24 hours) at 03/09/17 1143 Last data filed at 03/09/17 0817  Gross per 24 hour  Intake             1795 ml  Output             2200 ml  Net             -405 ml    Exam Awake Alert, Oriented x 3, No new F.N deficits, Normal affect [place].AT,PERRAL Supple Neck,No JVD, No cervical lymphadenopathy appriciated.  Symmetrical Chest wall movement, Good air movement bilaterally, CTAB RRR,No Gallops,Rubs or new Murmurs, No Parasternal Heave +ve B.Sounds, Abd Soft, Non tender, No organomegaly appriciated, No rebound -guarding or rigidity. No Cyanosis, Clubbing or edema, No new Rash or bruise   Data Review   CBC w Diff: Lab Results  Component Value Date   WBC 6.8 03/09/2017   HGB 10.2 (L) 03/09/2017   HCT 31.3 (L) 03/09/2017   PLT 176 03/09/2017   LYMPHOPCT 8 03/07/2017   MONOPCT 4 03/07/2017   EOSPCT 4 03/07/2017   BASOPCT 0 03/07/2017    CMP: Lab Results  Component Value Date   NA 136 03/09/2017   K 3.8 03/09/2017   CL 105 03/09/2017   CO2 24 03/09/2017   BUN 13 03/09/2017  CREATININE 1.06 03/09/2017   PROT 5.9 (L) 03/09/2017   ALBUMIN 2.9 (L) 03/09/2017   BILITOT 0.6 03/09/2017   ALKPHOS 41 03/09/2017   AST 17 03/09/2017   ALT 12 (L) 03/09/2017  .   Total Time in preparing paper work, data evaluation and todays exam - 20 minutes  Jani Gravel M.D on 03/09/2017 at 11:43 AM  [facility]   Office  [phone]

## 2017-03-09 NOTE — Progress Notes (Signed)
Patient ID: Gerald Holt, male   DOB: March 06, 1924, 83 y.o.   MRN: [id number]                                                                PROGRESS NOTE                                                                                                                                                                                                             Patient Demographics:    Gerald Holt, is a 83 y.o. male, DOB - [birth year], GYB:[id number]  Admit date - 03/07/2017   Admitting Physician Ivor Costa, MD  Outpatient Primary MD for the patient is [name], PA-C  LOS - 1  Outpatient Specialists:      Chief Complaint  Patient presents with  . Fever  . Emesis       Brief Narrative   83 y.o.malewith medical history significant of hypertension, hyperlipidemia, diabetes mellitus, atrial fibrillation Not on anticoagulants, intermittent self catheterization of the bladder, who presents with nausea, vomiting and constipation.  Patient states that she started having nausea and vomiting sicne yesterday morning, no diarrhea. She denies abdominal But reports burning sensation in epigastric area. He states that he has been constipated for 1 week. Patient does not have dysuria or burning on urination, but reports difficulty urinating. He needs to do self catheterization of the bladder. Patient does not unilateral weakness.  ED Course:pt was found to have WBC 19.3, lactic acid is 3.8, negative troponin, lipase 25, UA with trace amount of leukocytes and many bacteria,creatinine normal, temperature 98.8 oxygen saturation 91-94% on room. CT-abdomen/pelvis showed moderate stool in the colon with moderate feces retention in the rectum, no SBO. Pt is placed on tele bed for obs   Subjective:    Gerald Holt today has been feeling better.  Denies fever, chills, n/v, abd pain, diarrhea, brbpr dysuria, hematuria. .  Pt is tolerating regular diet.  , No headache, No chest pain, No new weakness  tingling or numbness, No Cough - SOB.   Assessment  & Plan :    Principal Problem:   Nausea & vomiting Active Problems:   Paroxysmal atrial fibrillation (HCC)   HLD (hyperlipidemia)   Hypothyroidism   Sepsis ([place])   Diabetes mellitus with complication ([place])  UTI (urinary tract infection)   Essential hypertension   Constipation  Hyponatremia Check cmp, serum osm, cortisol, tsh, urine sodium, urine osm,  Cont iv NS   Nausea & vomiting: resolved Most likely due to constipation as evidenced by the CT scan of abdomen. No SBO by CT scan. Doing well on MiraLAX and senokot Spoke with Wife and she mentioned similar episode about 2 weeks ago Start protonix 40mg  po qday  Appreciate GI input No EGD  Possible UTI and sepsis:  Continue rocephin Awaiting urine culture  Atrial Fibrillation: CHA2DS2-VASc Scoreis 6, needs oral anticoagulation, but patient is not on AC, unclear reason. May be due to old age and risk of fall. Heart rate is well controlled. Continue metoprolol  HLD:  Lovastatin  Hypothyroidism: Last TSH was on 1.0197, on 02/18/17 Continue home Synthroid  DM-II:Last A1c 7.2 on 03/05/17, fairly controled. Patient is taking metformin at home SSI  HTN: Lisinopril and metoprolol  Stroke: ASA, and lovastatin    DVT ppx: lovenox Code Status:DNR (I discussed with patient and explained the meaning of CODE STATUS. Patient wantsto be DNR). Family Communication: None at bed side.  Disposition Plan: Anticipate discharge either later today or tomorrow am depending upon sodium Consults called:none Admission status: Obs / tele   Lab Results  Component Value Date   PLT 191 03/08/2017    Antibiotics  :  rocephin  Anti-infectives    Start     Dose/Rate Route Frequency Ordered Stop   03/08/17 1800  cefTRIAXone (ROCEPHIN) 1 g in dextrose 5 % 50 mL IVPB     1 g 100 mL/hr over 30 Minutes Intravenous Every 24 hours 03/07/17 2232     03/07/17 1800   cefTRIAXone (ROCEPHIN) 1 g in dextrose 5 % 50 mL IVPB     1 g 100 mL/hr over 30 Minutes Intravenous  Once 03/07/17 1745 03/07/17 1855        Objective:   Vitals:   03/08/17 0428 03/08/17 1500 03/08/17 2033 03/09/17 0429  BP: (!) 125/54 (!) 105/50 101/69 (!) 126/54  Pulse: 63 65 63 67  Resp: 18 18 18 18   Temp: 97.6 F (36.4 C) 98 F (36.7 C) 97.8 F (36.6 C) 97.5 F (36.4 C)  TempSrc: Oral Oral Oral Oral  SpO2: 97% 96% 95% 97%    Wt Readings from Last 3 Encounters:  03/05/17 71.2 kg (157 lb)  02/18/17 70.1 kg (154 lb 9.6 oz)  01/22/17 72.6 kg (160 lb)     Intake/Output Summary (Last 24 hours) at 03/09/17 0720 Last data filed at 03/09/17 0413  Gross per 24 hour  Intake             1555 ml  Output             1450 ml  Net              105 ml     Physical Exam  Awake Alert, Oriented  No new F.N deficits, Normal affect [place].AT,PERRAL Supple Neck,No JVD, No cervical lymphadenopathy appriciated.  Symmetrical Chest wall movement, Good air movement bilaterally, CTAB RRR,No Gallops,Rubs or new Murmurs, No Parasternal Heave +ve B.Sounds, Abd Soft, No tenderness, No organomegaly appriciated, No rebound - guarding or rigidity. No Cyanosis, Clubbing or edema, No new Rash or bruise  Foley in place    Data Review:    CBC  Recent Labs Lab 03/07/17 1540 03/08/17 0300  WBC 19.3* 12.2*  HGB 12.5* 10.6*  HCT 35.8* 31.8*  PLT 225 191  MCV 82.9 84.1  MCH 28.9 28.0  MCHC 34.9 33.3  RDW 14.1 14.1  LYMPHSABS 1.5  --   MONOABS 0.7  --   EOSABS 0.7  --   BASOSABS 0.1  --     Chemistries   Recent Labs Lab 03/07/17 1540 03/08/17 0300  NA 133* 130*  K 4.5 3.7  CL 96* 98*  CO2 22 26  GLUCOSE 132* 138*  BUN 20 15  CREATININE 1.00 0.95  CALCIUM 9.6 8.5*  AST 31  --   ALT 18  --   ALKPHOS 43  --   BILITOT 1.0  --    ------------------------------------------------------------------------------------------------------------------ No results for input(s): CHOL,  HDL, LDLCALC, TRIG, CHOLHDL, LDLDIRECT in the last 72 hours.  Lab Results  Component Value Date   HGBA1C 7.2 (H) 03/05/2017   ------------------------------------------------------------------------------------------------------------------ No results for input(s): TSH, T4TOTAL, T3FREE, THYROIDAB in the last 72 hours.  Invalid input(s): FREET3 ------------------------------------------------------------------------------------------------------------------ No results for input(s): VITAMINB12, FOLATE, FERRITIN, TIBC, IRON, RETICCTPCT in the last 72 hours.  Coagulation profile  Recent Labs Lab 03/08/17 0300  INR 1.11    No results for input(s): DDIMER in the last 72 hours.  Cardiac Enzymes  Recent Labs Lab 03/07/17 1540  TROPONINI <0.03   ------------------------------------------------------------------------------------------------------------------ No results found for: BNP  Inpatient Medications  Scheduled Meds: . aspirin  81 mg Oral Daily  . [START ON 03/22/2017] cyanocobalamin  1,000 mcg Intramuscular Q30 days  . enoxaparin (LOVENOX) injection  30 mg Subcutaneous Q24H  . insulin aspart  0-5 Units Subcutaneous QHS  . insulin aspart  0-9 Units Subcutaneous TID WC  . levothyroxine  50 mcg Oral QAC breakfast  . lisinopril  2.5 mg Oral Daily  . metoprolol tartrate  12.5 mg Oral BID  . omega-3 acid ethyl esters  1,000 mg Oral Daily  . pantoprazole  40 mg Oral Daily  . polyethylene glycol  17 g Oral Daily  . pravastatin  10 mg Oral q1800  . sennosides  10 mL Oral BID  . sodium chloride flush  3 mL Intravenous Q12H   Continuous Infusions: . sodium chloride 100 mL/hr at 03/08/17 1803  . cefTRIAXone (ROCEPHIN)  IV Stopped (03/08/17 1833)   PRN Meds:.acetaminophen **OR** acetaminophen, docusate sodium, ondansetron (ZOFRAN) IV, triamcinolone cream, zolpidem  Micro Results Recent Results (from the past 240 hour(s))  Culture, blood (Routine X 2) w Reflex to ID Panel      Status: None (Preliminary result)   Collection Time: 03/07/17 10:55 PM  Result Value Ref Range Status   Specimen Description BLOOD RIGHT ARM  Final   Special Requests   Final    BOTTLES DRAWN AEROBIC AND ANAEROBIC Blood Culture adequate volume   Culture NO GROWTH < 12 HOURS  Final   Report Status PENDING  Incomplete  Culture, blood (Routine X 2) w Reflex to ID Panel     Status: None (Preliminary result)   Collection Time: 03/07/17 11:00 PM  Result Value Ref Range Status   Specimen Description BLOOD RIGHT WRIST  Final   Special Requests   Final    BOTTLES DRAWN AEROBIC ONLY Blood Culture adequate volume   Culture NO GROWTH < 12 HOURS  Final   Report Status PENDING  Incomplete    Radiology Reports Ct Abdomen Pelvis W Contrast  Result Date: 03/08/2017 CLINICAL DATA:  Nausea vomiting and constipation EXAM: CT ABDOMEN AND PELVIS WITH CONTRAST TECHNIQUE: Multidetector CT imaging of the abdomen and pelvis was performed using the standard protocol following bolus administration  of intravenous contrast. CONTRAST:  131mL ISOVUE-300 IOPAMIDOL (ISOVUE-300) INJECTION 61% COMPARISON:  03/07/2017 radiograph FINDINGS: Lower chest: Lung bases demonstrate streaky dependent atelectasis. No pleural effusion. Coronary artery calcifications. No pericardial effusion. Mitral annular calcification. Hepatobiliary: Calcified gallstone. No biliary dilatation. No focal hepatic abnormality Pancreas: Unremarkable. No pancreatic ductal dilatation or surrounding inflammatory changes. Spleen: Normal in size without focal abnormality. Adrenals/Urinary Tract: Adrenal glands are unremarkable. Kidneys are normal, without renal calculi, focal lesion, or hydronephrosis. Foley catheter in the bladder with small amount of air in the bladder. Stomach/Bowel: The stomach is nonenlarged. There is no evidence for a bowel obstruction. No significant colon wall thickening. Moderate stool in the colon. Normal appendix. Mild feces impaction in  the rectum Vascular/Lymphatic: Aortic atherosclerosis. No enlarged abdominal or pelvic lymph nodes. Reproductive: No masses Other: No free air or free fluid. Musculoskeletal: Degenerative changes of the spine. No acute or suspicious bone lesion. IMPRESSION: 1. Negative for bowel obstruction or bowel wall thickening. Moderate stool in the colon with moderate feces retention in the rectum. 2. Gallstones Electronically Signed   By: Donavan Foil M.D.   On: 03/08/2017 03:08   Dg Chest Port 1 View  Result Date: 02/18/2017 CLINICAL DATA:  83 year old male with weakness. History of diabetes. EXAM: PORTABLE CHEST 1 VIEW COMPARISON:  Chest radiograph dated 12/27/2016 FINDINGS: There is shallow inspiration with bibasilar atelectatic changes. No focal consolidation, pleural effusion, or pneumothorax. The cardiac silhouette appears stable in similar to prior study. There is atherosclerotic calcification of the aortic arch. No acute osseous pathology. IMPRESSION: No active disease. Electronically Signed   By: Anner Crete M.D.   On: 02/18/2017 00:15   Dg Abd Acute W/chest  Result Date: 03/07/2017 CLINICAL DATA:  Vomiting and fever. EXAM: DG ABDOMEN ACUTE W/ 1V CHEST COMPARISON:  Chest x-ray on 02/18/2017 FINDINGS: There is no evidence of pulmonary edema, consolidation, pneumothorax, nodule or pleural fluid. The heart size is normal. Abdominal films show no evidence of acute bowel obstruction or free intraperitoneal air. There are a few air-fluid levels in small bowel which may be consistent with enteritis. Scattered stool throughout the colon without evidence of fecal impaction. Bony structures are unremarkable. The splenic artery is calcified. IMPRESSION: Scattered air-fluid levels in small bowel which may be consistent with enteritis. No evidence of small bowel obstruction or free intraperitoneal air. Electronically Signed   By: Aletta Edouard M.D.   On: 03/07/2017 16:40    Time Spent in minutes  30   Jani Gravel M.D on 03/09/2017 at 7:20 AM  Between 7am to 7pm - Pager - [phone] After 7pm go to [email or web address] - password [facility]  [facility] -  Office  [phone]

## 2017-03-09 NOTE — Progress Notes (Signed)
Progress Note for [facility]  Subjective: No complaints of nausea, vomiting, or abdominal pain.  Two good bowel movements.  Objective: Vital signs in last 24 hours: Temp:  [97.5 F (36.4 C)-98 F (36.7 C)] 97.5 F (36.4 C) (06/17 0429) Pulse Rate:  [63-67] 67 (06/17 0429) Resp:  [18] 18 (06/17 0429) BP: (101-126)/(50-69) 126/54 (06/17 0429) SpO2:  [95 %-97 %] 97 % (06/17 0429) Last BM Date: 03/08/17  Intake/Output from previous day: 06/16 0701 - 06/17 0700 In: 5784 [P.O.:250; I.V.:1255; IV Piggyback:50] Out: 6962 [Urine:1450] Intake/Output this shift: No intake/output data recorded.  General appearance: alert and no distress GI: soft, non-tender; bowel sounds normal; no masses,  no organomegaly  Lab Results:  Recent Labs  03/07/17 1540 03/08/17 0300  WBC 19.3* 12.2*  HGB 12.5* 10.6*  HCT 35.8* 31.8*  PLT 225 191   BMET  Recent Labs  03/07/17 1540 03/08/17 0300  NA 133* 130*  K 4.5 3.7  CL 96* 98*  CO2 22 26  GLUCOSE 132* 138*  BUN 20 15  CREATININE 1.00 0.95  CALCIUM 9.6 8.5*   LFT  Recent Labs  03/07/17 1540  PROT 7.1  ALBUMIN 3.7  AST 31  ALT 18  ALKPHOS 43  BILITOT 1.0   PT/INR  Recent Labs  03/08/17 0300  LABPROT 14.3  INR 1.11   Hepatitis Panel No results for input(s): HEPBSAG, HCVAB, HEPAIGM, HEPBIGM in the last 72 hours. C-Diff No results for input(s): CDIFFTOX in the last 72 hours. Fecal Lactopherrin No results for input(s): FECLLACTOFRN in the last 72 hours.  Studies/Results: Ct Abdomen Pelvis W Contrast  Result Date: 03/08/2017 CLINICAL DATA:  Nausea vomiting and constipation EXAM: CT ABDOMEN AND PELVIS WITH CONTRAST TECHNIQUE: Multidetector CT imaging of the abdomen and pelvis was performed using the standard protocol following bolus administration of intravenous contrast. CONTRAST:  150mL ISOVUE-300 IOPAMIDOL (ISOVUE-300) INJECTION 61% COMPARISON:  03/07/2017 radiograph FINDINGS: Lower chest: Lung bases demonstrate streaky  dependent atelectasis. No pleural effusion. Coronary artery calcifications. No pericardial effusion. Mitral annular calcification. Hepatobiliary: Calcified gallstone. No biliary dilatation. No focal hepatic abnormality Pancreas: Unremarkable. No pancreatic ductal dilatation or surrounding inflammatory changes. Spleen: Normal in size without focal abnormality. Adrenals/Urinary Tract: Adrenal glands are unremarkable. Kidneys are normal, without renal calculi, focal lesion, or hydronephrosis. Foley catheter in the bladder with small amount of air in the bladder. Stomach/Bowel: The stomach is nonenlarged. There is no evidence for a bowel obstruction. No significant colon wall thickening. Moderate stool in the colon. Normal appendix. Mild feces impaction in the rectum Vascular/Lymphatic: Aortic atherosclerosis. No enlarged abdominal or pelvic lymph nodes. Reproductive: No masses Other: No free air or free fluid. Musculoskeletal: Degenerative changes of the spine. No acute or suspicious bone lesion. IMPRESSION: 1. Negative for bowel obstruction or bowel wall thickening. Moderate stool in the colon with moderate feces retention in the rectum. 2. Gallstones Electronically Signed   By: Donavan Foil M.D.   On: 03/08/2017 03:08   Dg Abd Acute W/chest  Result Date: 03/07/2017 CLINICAL DATA:  Vomiting and fever. EXAM: DG ABDOMEN ACUTE W/ 1V CHEST COMPARISON:  Chest x-ray on 02/18/2017 FINDINGS: There is no evidence of pulmonary edema, consolidation, pneumothorax, nodule or pleural fluid. The heart size is normal. Abdominal films show no evidence of acute bowel obstruction or free intraperitoneal air. There are a few air-fluid levels in small bowel which may be consistent with enteritis. Scattered stool throughout the colon without evidence of fecal impaction. Bony structures are unremarkable. The splenic artery  is calcified. IMPRESSION: Scattered air-fluid levels in small bowel which may be consistent with enteritis. No  evidence of small bowel obstruction or free intraperitoneal air. Electronically Signed   By: Aletta Edouard M.D.   On: 03/07/2017 16:40    Medications:  Scheduled: . aspirin  81 mg Oral Daily  . [START ON 03/22/2017] cyanocobalamin  1,000 mcg Intramuscular Q30 days  . enoxaparin (LOVENOX) injection  30 mg Subcutaneous Q24H  . insulin aspart  0-5 Units Subcutaneous QHS  . insulin aspart  0-9 Units Subcutaneous TID WC  . levothyroxine  50 mcg Oral QAC breakfast  . lisinopril  2.5 mg Oral Daily  . metoprolol tartrate  12.5 mg Oral BID  . omega-3 acid ethyl esters  1,000 mg Oral Daily  . pantoprazole  40 mg Oral Daily  . polyethylene glycol  17 g Oral Daily  . pravastatin  10 mg Oral q1800  . sennosides  10 mL Oral BID  . sodium chloride flush  3 mL Intravenous Q12H   Continuous: . sodium chloride 100 mL/hr at 03/08/17 1803  . cefTRIAXone (ROCEPHIN)  IV Stopped (03/08/17 1833)    Assessment/Plan: 1) Nausea/Vomiting - ? Etiology.  Resolved. 2) Constipation - Good bowel regimen.  Plan: 1) Continue with current care. 2) Signing off.  LOS: 1 day   Gerald Holt D 03/09/2017, 8:04 AM

## 2017-03-09 NOTE — Discharge Instructions (Signed)
Acute Urinary Retention, Male  Acute urinary retention is the temporary inability to urinate.  This is a common problem in older men. As men age their prostates become larger and block the flow of urine from the bladder. This is usually a problem that has come on gradually.  Follow these instructions at home:  If you are sent home with a Foley catheter and a drainage system, you will need to discuss the best course of action with your health care provider. While the catheter is in, maintain a good intake of fluids. Keep the drainage bag emptied and lower than your catheter. This is so that contaminated urine will not flow back into your bladder, which could lead to a urinary tract infection.  There are two main types of drainage bags. One is a large bag that usually is used at night. It has a good capacity that will allow you to sleep through the night without having to empty it. The second type is called a leg bag. It has a smaller capacity, so it needs to be emptied more frequently. However, the main advantage is that it can be attached by a leg strap and can go underneath your clothing, allowing you the freedom to move about or leave your home.  Only take over-the-counter or prescription medicines for pain, discomfort, or fever as directed by your health care provider.  Contact a health care provider if:   You develop a low-grade fever.   You experience spasms or leakage of urine with the spasms.  Get help right away if:   You develop chills or fever.   Your catheter stops draining urine.   Your catheter falls out.   You start to develop increased bleeding that does not respond to rest and increased fluid intake.  This information is not intended to replace advice given to you by your health care provider. Make sure you discuss any questions you have with your health care provider.  Document Released: 12/16/2000 Document Revised: 02/21/2016 Document Reviewed: 02/18/2013  Elsevier Interactive Patient  Education  2017 [employer]

## 2017-03-10 ENCOUNTER — Telehealth: Payer: Self-pay

## 2017-03-10 ENCOUNTER — Encounter: Payer: Self-pay | Admitting: Physician Assistant

## 2017-03-10 NOTE — Telephone Encounter (Signed)
Spoke with patients wife, Elease Etienne.   Transition Care Management Follow-up Telephone Call   Date discharged? 03/09/2017   How have you been since you were released from the hospital? "He's glad to be home". Reports patient has had several bowel movements since discharged home due to laxatives administered in hospital.   Do you understand why you were in the hospital? yes, "they think it was constipation"   Do you understand the discharge instructions? yes   Where were you discharged to? Home. Lives with wife.    Items Reviewed:  Medications reviewed: no changes except addition of Keflex and Protonix. Wife states they are using a digestive enzyme he's used in the past before trying the Protonix.   Allergies reviewed: yes  Dietary changes reviewed: yes, bland diet  Referrals reviewed: no   Functional Questionnaire:   Activities of Daily Living (ADLs):   He states they are independent in the following: None. States they require assistance with the following: ambulation, bathing and hygiene, feeding, continence, grooming, toileting and dressing. Patient is never alone for ADL's.    Any transportation issues/concerns?: no   Any patient concerns? no   Confirmed importance and date/time of follow-up visits scheduled yes  Provider Appointment booked with Elyn Aquas, PA-C on Friday, 03/14/2017 at 11:30.   Confirmed with patient if condition begins to worsen call PCP or go to the ER.  Patient was given the office number and encouraged to call back with question or concerns.  : yes

## 2017-03-12 LAB — CULTURE, BLOOD (ROUTINE X 2)
CULTURE: NO GROWTH
Culture: NO GROWTH
SPECIAL REQUESTS: ADEQUATE
Special Requests: ADEQUATE

## 2017-03-13 NOTE — Progress Notes (Signed)
Patient presents to clinic today for TCM visit after discharge from hospital. Patient presented to ER on 03/07/17 with his wife with complaints of fatigue, nausea and vomiting. ER workup included labs revealing leukocytosis, hyponatremia and elevated lactic acid. Also questionable UTI giving UA findings. CT abdomen negative for acute infectious process. Gallstone noted and thought to be non-contributory. Significant constipation noted. Patient subsequently admitted to the hospital for further assessment and management. IV Antibiotics given, transitioned to oral antibiotics for 5 days. Symptoms resolved quickly during hospitalization. Was discharged on 03/09/17 home with family.   Since discharge patient and wife note patient doing well overall. Endorse hydrating and eating well. Deny fever, chills, or other complaints. Is staying at his baseline activity level. Note good output with urinary catheters. No noted cloudy or foul-smelling urine. No change to mental status. They are wanting to restart outpatient physical therapy.  Past Medical History:  Diagnosis Date  . A-fib ([place])   . Cataracts, bilateral   . Chronic dermatitis   . Diabetes type 2, controlled ([place]) 2000  . History of chicken pox   . Mumps    Adult  . Retinopathy   . Shingles   . Stroke [facility]) Nov 2011  . Undescended testicle, unilateral    Right  . Whooping cough     Current Outpatient Prescriptions on File Prior to Visit  Medication Sig Dispense Refill  . aspirin 81 MG tablet Take 1 tablet (81 mg total) by mouth daily.    . Black Elderberry,Berry-Flower, 575 MG CAPS Take 1 capsule by mouth 3 (three) times daily with meals.    . Calcium Carbonate Antacid (ALKA-SELTZER ANTACID PO) Take 2 tablets by mouth daily as needed (for indegestion).     . Cranberry 1000 MG CAPS Take 2 capsules by mouth daily.    . cyanocobalamin (,VITAMIN B-12,) 1000 MCG/ML injection Inject 1000 mcg intramuscular daily for 7 days, then once a week for 4  weeks, then once a month for a year (Patient taking differently: Inject 1,000 mcg into the muscle every 30 (thirty) days. ) 25 mL 0  . docusate sodium (COLACE) 100 MG capsule Take 200 mg by mouth daily as needed for mild constipation.     . Eyelid Cleansers (STERILID EX) Apply 1 application topically See admin instructions. Eye Wash: Once Daily     . glucose blood test strip One Touch Verio strips Use as instructed to check fasting sugars once daily .Dx:E11.9 100 each 12  . levothyroxine (SYNTHROID, LEVOTHROID) 50 MCG tablet Take 1 tablet (50 mcg total) by mouth daily. 90 tablet 3  . lisinopril (PRINIVIL,ZESTRIL) 2.5 MG tablet Take 1 tablet by mouth daily.    Marland Kitchen lovastatin (MEVACOR) 10 MG tablet TAKE 1 TABLET AT BEDTIME 90 tablet 1  . metFORMIN (GLUCOPHAGE-XR) 500 MG 24 hr tablet TAKE 2 TABLETS WITH BREAKFAST AND AT BEDTIME 360 tablet 1  . metoprolol tartrate (LOPRESSOR) 25 MG tablet Take 0.5 tablets (12.5 mg total) by mouth 2 (two) times daily. 180 tablet 1  . Miconazole Nitrate (TRIPLE PASTE AF) 2 % OINT Apply 1 application topically daily as needed (mouth care).     . Omega-3 Fatty Acids (SB OMEGA-3 FISH OIL) 1000 MG CAPS Take 1,000 mg by mouth daily. Reported on 10/06/2015    . OVER THE COUNTER MEDICATION Take 1 capsule by mouth 2 (two) times daily. OTC Focus Select    . pantoprazole (PROTONIX) 40 MG tablet Take 1 tablet (40 mg total) by mouth daily. 30 tablet  0  . polyethylene glycol (MIRALAX / GLYCOLAX) packet Take 17 g by mouth daily as needed for moderate constipation or severe constipation. 14 each 0  . triamcinolone cream (KENALOG) 0.1 % Apply 1 application topically daily as needed (for itching).      No current facility-administered medications on file prior to visit.     Allergies  Allergen Reactions  . Flomax [Tamsulosin Hcl] Other (See Comments)    Hypotension  . Shellfish Allergy Anaphylaxis  . Aspirin-Dipyridamole Er Other (See Comments)    Headaches   . Other Other (See  Comments)    Blood Thinner given in Rehab gave H/As - Not Coumadin/Headaches      Family History  Problem Relation Age of Onset  . Pneumonia Mother 30       Deceased  . GI Bleed Father 85       Deceased - Ulcers  . Diabetes Cousin   . Diabetes Brother   . Cancer Paternal Aunt     Social History   Social History  . Marital status: Married    Spouse name: N/A  . Number of children: N/A  . Years of education: N/A   Social History Main Topics  . Smoking status: Former Research scientist (life sciences)  . Smokeless tobacco: Never Used     Comment: Hasn't smoked since age 49  . Alcohol use No  . Drug use: No  . Sexual activity: Not Asked   Other Topics Concern  . None   Social History Narrative   Lives with wife in a one story home.  Has 1 child.  Retired Armed forces technical officer.  Education: Oceanographer.      Review of Systems - See HPI.  All other ROS are negative.  BP 110/60   Pulse 62   Temp 98 F (36.7 C) (Oral)   Resp 14   Ht '5\' 6"'  (1.676 m)   Wt 160 lb (72.6 kg)   SpO2 96%   BMI 25.82 kg/m   Physical Exam  Constitutional: He is oriented to person, place, and time and well-developed, well-nourished, and in no distress.  HENT:  Head: Normocephalic and atraumatic.  Eyes: Conjunctivae are normal.  Neck: Neck supple.  Cardiovascular: Normal heart sounds and intact distal pulses.   Chronic irregularly irregular rhythm.  Pulmonary/Chest: Breath sounds normal. No respiratory distress. He has no wheezes. He has no rales. He exhibits no tenderness.  Abdominal: Soft. Bowel sounds are normal. He exhibits no distension. There is no tenderness.  Lymphadenopathy:    He has no cervical adenopathy.  Neurological: He is alert and oriented to person, place, and time. No cranial nerve deficit.  Skin: Skin is warm and dry. No rash noted.  Psychiatric: Affect normal.   Recent Results (from the past 2160 hour(s))  POCT Urinalysis Dipstick (Automated)     Status: Abnormal   Collection Time: 12/17/16  1:31 PM    Result Value Ref Range   Color, UA yellow    Clarity, UA clear    Glucose, UA negative    Bilirubin, UA negative    Ketones, UA negative    Spec Grav, UA 1.015 1.030 - 1.035   Blood, UA 10    pH, UA 6.0 5.0 - 8.0   Protein, UA 0.15    Urobilinogen, UA negative Negative - 2.0   Nitrite, UA negative    Leukocytes, UA large (3+) (A) Negative  Urine Culture     Status: None   Collection Time: 12/17/16  1:56 PM  Result  Value Ref Range   Culture ESCHERICHIA COLI    Colony Count Greater than 100,000 CFU/mL    Organism ID, Bacteria ESCHERICHIA COLI       Susceptibility   Escherichia coli -  (no method available)    AMPICILLIN <=2 Sensitive     AMOX/CLAVULANIC <=2 Sensitive     AMPICILLIN/SULBACTAM <=2 Sensitive     PIP/TAZO <=4 Sensitive     IMIPENEM <=0.25 Sensitive     CEFAZOLIN <=4 Not Reportable     CEFTRIAXONE <=1 Sensitive     CEFTAZIDIME <=1 Sensitive     CEFEPIME <=1 Sensitive     GENTAMICIN <=1 Sensitive     TOBRAMYCIN <=1 Sensitive     CIPROFLOXACIN <=0.25 Sensitive     LEVOFLOXACIN <=0.12 Sensitive     NITROFURANTOIN <=16 Sensitive     TRIMETH/SULFA* <=20 Sensitive      * NR=NOT REPORTABLE,SEE COMMENTORAL therapy:A cefazolin MIC of <32 predicts susceptibility to the oral agents cefaclor,cefdinir,cefpodoxime,cefprozil,cefuroxime,cephalexin,and loracarbef when used for therapy of uncomplicated UTIs due to E.coli,K.pneumomiae,and P.mirabilis. PARENTERAL therapy: A cefazolinMIC of >8 indicates resistance to parenteralcefazolin. An alternate test method must beperformed to confirm susceptibility to parenteralcefazolin.  Urinalysis, Routine w reflex microscopic     Status: None   Collection Time: 12/27/16  9:59 AM  Result Value Ref Range   Color, Urine YELLOW YELLOW   APPearance CLEAR CLEAR   Specific Gravity, Urine 1.009 1.005 - 1.030   pH 7.0 5.0 - 8.0   Glucose, UA NEGATIVE NEGATIVE mg/dL   Hgb urine dipstick NEGATIVE NEGATIVE   Bilirubin Urine NEGATIVE NEGATIVE    Ketones, ur NEGATIVE NEGATIVE mg/dL   Protein, ur NEGATIVE NEGATIVE mg/dL   Nitrite NEGATIVE NEGATIVE   Leukocytes, UA NEGATIVE NEGATIVE    Comment: Microscopic not done on urines with negative protein, blood, leukocytes, nitrite, or glucose < 500 mg/dL.  Urine culture     Status: None   Collection Time: 12/27/16  9:59 AM  Result Value Ref Range   Specimen Description URINE, CLEAN CATCH    Special Requests NONE    Culture      NO GROWTH Performed at [facility], [address], [place], [place] [address]    Report Status 12/28/2016 FINAL   Comprehensive metabolic panel     Status: Abnormal   Collection Time: 12/27/16 10:27 AM  Result Value Ref Range   Sodium 133 (L) 135 - 145 mmol/L   Potassium 4.2 3.5 - 5.1 mmol/L   Chloride 96 (L) 101 - 111 mmol/L   CO2 26 22 - 32 mmol/L   Glucose, Bld 192 (H) 65 - 99 mg/dL   BUN 14 6 - 20 mg/dL   Creatinine, Ser 1.07 0.61 - 1.24 mg/dL   Calcium 9.6 8.9 - 10.3 mg/dL   Total Protein 7.3 6.5 - 8.1 g/dL   Albumin 3.8 3.5 - 5.0 g/dL   AST 25 15 - 41 U/L   ALT 19 17 - 63 U/L   Alkaline Phosphatase 45 38 - 126 U/L   Total Bilirubin 0.7 0.3 - 1.2 mg/dL   GFR calc non Af Amer 59 (L) >60 mL/min   GFR calc Af Amer >60 >60 mL/min    Comment: (NOTE) The eGFR has been calculated using the CKD EPI equation. This calculation has not been validated in all clinical situations. eGFR's persistently <60 mL/min signify possible Chronic Kidney Disease.    Anion gap 11 5 - 15  CBC with Differential/Platelet     Status: Abnormal  Collection Time: 12/27/16 10:27 AM  Result Value Ref Range   WBC 7.3 4.0 - 10.5 K/uL   RBC 4.31 4.22 - 5.81 MIL/uL   Hemoglobin 12.7 (L) 13.0 - 17.0 g/dL   HCT 36.9 (L) 39.0 - 52.0 %   MCV 85.6 78.0 - 100.0 fL   MCH 29.5 26.0 - 34.0 pg   MCHC 34.4 30.0 - 36.0 g/dL   RDW 14.2 11.5 - 15.5 %   Platelets 191 150 - 400 K/uL   Neutrophils Relative % 67 %   Neutro Abs 4.9 1.7 - 7.7 K/uL   Lymphocytes Relative 10 %   Lymphs  Abs 0.8 0.7 - 4.0 K/uL   Monocytes Relative 13 %   Monocytes Absolute 1.0 0.1 - 1.0 K/uL   Eosinophils Relative 9 %   Eosinophils Absolute 0.6 0.0 - 0.7 K/uL   Basophils Relative 1 %   Basophils Absolute 0.1 0.0 - 0.1 K/uL  POCT Urinalysis Dipstick     Status: Abnormal   Collection Time: 01/22/17 11:47 AM  Result Value Ref Range   Color, UA yellow    Clarity, UA cloudy    Glucose, UA negative    Bilirubin, UA negative    Ketones, UA negative    Spec Grav, UA 1.010 1.010 - 1.025   Blood, UA +-    pH, UA 6.5 5.0 - 8.0   Protein, UA negative    Urobilinogen, UA 0.2 0.2 or 1.0 E.U./dL   Nitrite, UA negative    Leukocytes, UA Large (3+) (A) Negative  Urine culture     Status: None   Collection Time: 01/22/17  1:49 PM  Result Value Ref Range   Organism ID, Bacteria NO GROWTH   Comprehensive metabolic panel     Status: Abnormal   Collection Time: 02/17/17 11:43 PM  Result Value Ref Range   Sodium 133 (L) 135 - 145 mmol/L   Potassium 4.6 3.5 - 5.1 mmol/L   Chloride 97 (L) 101 - 111 mmol/L   CO2 22 22 - 32 mmol/L   Glucose, Bld 196 (H) 65 - 99 mg/dL   BUN 19 6 - 20 mg/dL   Creatinine, Ser 1.03 0.61 - 1.24 mg/dL   Calcium 9.5 8.9 - 10.3 mg/dL   Total Protein 6.6 6.5 - 8.1 g/dL   Albumin 3.5 3.5 - 5.0 g/dL   AST 23 15 - 41 U/L   ALT 17 17 - 63 U/L   Alkaline Phosphatase 45 38 - 126 U/L   Total Bilirubin 0.7 0.3 - 1.2 mg/dL   GFR calc non Af Amer >60 >60 mL/min   GFR calc Af Amer >60 >60 mL/min    Comment: (NOTE) The eGFR has been calculated using the CKD EPI equation. This calculation has not been validated in all clinical situations. eGFR's persistently <60 mL/min signify possible Chronic Kidney Disease.    Anion gap 14 5 - 15  Lipase, blood     Status: None   Collection Time: 02/17/17 11:43 PM  Result Value Ref Range   Lipase 30 11 - 51 U/L  Troponin I     Status: None   Collection Time: 02/17/17 11:43 PM  Result Value Ref Range   Troponin I <0.03 <0.03 ng/mL  CBC  with Differential     Status: Abnormal   Collection Time: 02/17/17 11:43 PM  Result Value Ref Range   WBC 19.8 (H) 4.0 - 10.5 K/uL   RBC 4.47 4.22 - 5.81 MIL/uL   Hemoglobin 12.6 (  L) 13.0 - 17.0 g/dL   HCT 37.5 (L) 39.0 - 52.0 %   MCV 83.9 78.0 - 100.0 fL   MCH 28.2 26.0 - 34.0 pg   MCHC 33.6 30.0 - 36.0 g/dL   RDW 14.2 11.5 - 15.5 %   Platelets 227 150 - 400 K/uL   Neutrophils Relative % 84 %   Neutro Abs 16.6 (H) 1.7 - 7.7 K/uL   Lymphocytes Relative 5 %   Lymphs Abs 1.1 0.7 - 4.0 K/uL   Monocytes Relative 7 %   Monocytes Absolute 1.4 (H) 0.1 - 1.0 K/uL   Eosinophils Relative 4 %   Eosinophils Absolute 0.7 0.0 - 0.7 K/uL   Basophils Relative 0 %   Basophils Absolute 0.0 0.0 - 0.1 K/uL  Urinalysis, Routine w reflex microscopic     Status: Abnormal   Collection Time: 02/18/17 12:00 AM  Result Value Ref Range   Color, Urine YELLOW YELLOW   APPearance CLEAR CLEAR   Specific Gravity, Urine 1.010 1.005 - 1.030   pH 6.0 5.0 - 8.0   Glucose, UA NEGATIVE NEGATIVE mg/dL   Hgb urine dipstick NEGATIVE NEGATIVE   Bilirubin Urine NEGATIVE NEGATIVE   Ketones, ur 5 (A) NEGATIVE mg/dL   Protein, ur NEGATIVE NEGATIVE mg/dL   Nitrite NEGATIVE NEGATIVE   Leukocytes, UA MODERATE (A) NEGATIVE   RBC / HPF 0-5 0 - 5 RBC/hpf   WBC, UA TOO NUMEROUS TO COUNT 0 - 5 WBC/hpf   Bacteria, UA NONE SEEN NONE SEEN   Squamous Epithelial / LPF NONE SEEN NONE SEEN  Culture, Urine     Status: Abnormal   Collection Time: 02/18/17 12:00 AM  Result Value Ref Range   Specimen Description URINE, RANDOM    Special Requests NONE    Culture 60,000 COLONIES/mL ENTEROCOCCUS FAECALIS (A)    Report Status 02/20/2017 FINAL    Organism ID, Bacteria ENTEROCOCCUS FAECALIS (A)       Susceptibility   Enterococcus faecalis - MIC*    AMPICILLIN <=2 SENSITIVE Sensitive     LEVOFLOXACIN 1 SENSITIVE Sensitive     NITROFURANTOIN <=16 SENSITIVE Sensitive     VANCOMYCIN 1 SENSITIVE Sensitive     * 60,000 COLONIES/mL  ENTEROCOCCUS FAECALIS  Glucose, capillary     Status: Abnormal   Collection Time: 02/18/17  4:18 AM  Result Value Ref Range   Glucose-Capillary 186 (H) 65 - 99 mg/dL  CK     Status: Abnormal   Collection Time: 02/18/17  4:47 AM  Result Value Ref Range   Total CK 11 (L) 49 - 397 U/L  TSH     Status: None   Collection Time: 02/18/17  4:47 AM  Result Value Ref Range   TSH 1.197 0.350 - 4.500 uIU/mL    Comment: Performed by a 3rd Generation assay with a functional sensitivity of <=0.01 uIU/mL.  Troponin I     Status: None   Collection Time: 02/18/17  4:47 AM  Result Value Ref Range   Troponin I <0.03 <0.03 ng/mL  CBC     Status: Abnormal   Collection Time: 02/18/17  4:47 AM  Result Value Ref Range   WBC 16.9 (H) 4.0 - 10.5 K/uL   RBC 4.13 (L) 4.22 - 5.81 MIL/uL   Hemoglobin 11.7 (L) 13.0 - 17.0 g/dL   HCT 34.9 (L) 39.0 - 52.0 %   MCV 84.5 78.0 - 100.0 fL   MCH 28.3 26.0 - 34.0 pg   MCHC 33.5 30.0 - 36.0 g/dL  RDW 14.2 11.5 - 15.5 %   Platelets 212 150 - 400 K/uL  Creatinine, serum     Status: None   Collection Time: 02/18/17  4:47 AM  Result Value Ref Range   Creatinine, Ser 1.04 0.61 - 1.24 mg/dL   GFR calc non Af Amer >60 >60 mL/min   GFR calc Af Amer >60 >60 mL/min    Comment: (NOTE) The eGFR has been calculated using the CKD EPI equation. This calculation has not been validated in all clinical situations. eGFR's persistently <60 mL/min signify possible Chronic Kidney Disease.   Glucose, capillary     Status: Abnormal   Collection Time: 02/18/17  8:02 AM  Result Value Ref Range   Glucose-Capillary 217 (H) 65 - 99 mg/dL  Glucose, capillary     Status: Abnormal   Collection Time: 02/18/17 11:41 AM  Result Value Ref Range   Glucose-Capillary 226 (H) 65 - 99 mg/dL  Glucose, capillary     Status: Abnormal   Collection Time: 02/18/17  5:19 PM  Result Value Ref Range   Glucose-Capillary 158 (H) 65 - 99 mg/dL  Glucose, capillary     Status: Abnormal   Collection Time:  02/18/17  8:59 PM  Result Value Ref Range   Glucose-Capillary 198 (H) 65 - 99 mg/dL  Glucose, capillary     Status: Abnormal   Collection Time: 02/19/17  7:47 AM  Result Value Ref Range   Glucose-Capillary 171 (H) 65 - 99 mg/dL  Glucose, capillary     Status: Abnormal   Collection Time: 02/19/17 12:36 PM  Result Value Ref Range   Glucose-Capillary 187 (H) 65 - 99 mg/dL  Glucose, capillary     Status: Abnormal   Collection Time: 02/19/17  5:25 PM  Result Value Ref Range   Glucose-Capillary 151 (H) 65 - 99 mg/dL  Glucose, capillary     Status: Abnormal   Collection Time: 02/19/17  8:09 PM  Result Value Ref Range   Glucose-Capillary 157 (H) 65 - 99 mg/dL  Glucose, capillary     Status: Abnormal   Collection Time: 02/19/17 10:31 PM  Result Value Ref Range   Glucose-Capillary 164 (H) 65 - 99 mg/dL  Glucose, capillary     Status: Abnormal   Collection Time: 02/20/17  5:26 AM  Result Value Ref Range   Glucose-Capillary 175 (H) 65 - 99 mg/dL   Comment 1 Notify RN    Comment 2 Document in Chart   CBC     Status: Abnormal   Collection Time: 02/20/17  5:37 AM  Result Value Ref Range   WBC 9.0 4.0 - 10.5 K/uL   RBC 4.02 (L) 4.22 - 5.81 MIL/uL   Hemoglobin 11.2 (L) 13.0 - 17.0 g/dL   HCT 33.5 (L) 39.0 - 52.0 %   MCV 83.3 78.0 - 100.0 fL   MCH 27.9 26.0 - 34.0 pg   MCHC 33.4 30.0 - 36.0 g/dL   RDW 14.2 11.5 - 15.5 %   Platelets 195 150 - 400 K/uL  Basic metabolic panel     Status: Abnormal   Collection Time: 02/20/17  5:37 AM  Result Value Ref Range   Sodium 136 135 - 145 mmol/L   Potassium 4.0 3.5 - 5.1 mmol/L   Chloride 102 101 - 111 mmol/L   CO2 25 22 - 32 mmol/L   Glucose, Bld 181 (H) 65 - 99 mg/dL   BUN 16 6 - 20 mg/dL   Creatinine, Ser 0.99 0.61 - 1.24 mg/dL  Calcium 9.2 8.9 - 10.3 mg/dL   GFR calc non Af Amer >60 >60 mL/min   GFR calc Af Amer >60 >60 mL/min    Comment: (NOTE) The eGFR has been calculated using the CKD EPI equation. This calculation has not been  validated in all clinical situations. eGFR's persistently <60 mL/min signify possible Chronic Kidney Disease.    Anion gap 9 5 - 15  Glucose, capillary     Status: Abnormal   Collection Time: 02/20/17  7:55 AM  Result Value Ref Range   Glucose-Capillary 182 (H) 65 - 99 mg/dL  Glucose, capillary     Status: Abnormal   Collection Time: 02/20/17 12:04 PM  Result Value Ref Range   Glucose-Capillary 273 (H) 65 - 99 mg/dL  Hemoglobin A1c     Status: Abnormal   Collection Time: 03/05/17 12:04 PM  Result Value Ref Range   Hgb A1c MFr Bld 7.2 (H) 4.6 - 6.5 %    Comment: Glycemic Control Guidelines for People with Diabetes:Non Diabetic:  <6%Goal of Therapy: <7%Additional Action Suggested:  >8%   Comprehensive metabolic panel     Status: Abnormal   Collection Time: 03/07/17  3:40 PM  Result Value Ref Range   Sodium 133 (L) 135 - 145 mmol/L   Potassium 4.5 3.5 - 5.1 mmol/L   Chloride 96 (L) 101 - 111 mmol/L   CO2 22 22 - 32 mmol/L   Glucose, Bld 132 (H) 65 - 99 mg/dL   BUN 20 6 - 20 mg/dL   Creatinine, Ser 1.00 0.61 - 1.24 mg/dL   Calcium 9.6 8.9 - 10.3 mg/dL   Total Protein 7.1 6.5 - 8.1 g/dL   Albumin 3.7 3.5 - 5.0 g/dL   AST 31 15 - 41 U/L   ALT 18 17 - 63 U/L   Alkaline Phosphatase 43 38 - 126 U/L   Total Bilirubin 1.0 0.3 - 1.2 mg/dL   GFR calc non Af Amer >60 >60 mL/min   GFR calc Af Amer >60 >60 mL/min    Comment: (NOTE) The eGFR has been calculated using the CKD EPI equation. This calculation has not been validated in all clinical situations. eGFR's persistently <60 mL/min signify possible Chronic Kidney Disease.    Anion gap 15 5 - 15  CBC with Differential     Status: Abnormal   Collection Time: 03/07/17  3:40 PM  Result Value Ref Range   WBC 19.3 (H) 4.0 - 10.5 K/uL   RBC 4.32 4.22 - 5.81 MIL/uL   Hemoglobin 12.5 (L) 13.0 - 17.0 g/dL   HCT 35.8 (L) 39.0 - 52.0 %   MCV 82.9 78.0 - 100.0 fL   MCH 28.9 26.0 - 34.0 pg   MCHC 34.9 30.0 - 36.0 g/dL   RDW 14.1 11.5 -  15.5 %   Platelets 225 150 - 400 K/uL   Neutrophils Relative % 84 %   Neutro Abs 16.2 (H) 1.7 - 7.7 K/uL   Lymphocytes Relative 8 %   Lymphs Abs 1.5 0.7 - 4.0 K/uL   Monocytes Relative 4 %   Monocytes Absolute 0.7 0.1 - 1.0 K/uL   Eosinophils Relative 4 %   Eosinophils Absolute 0.7 0.0 - 0.7 K/uL   Basophils Relative 0 %   Basophils Absolute 0.1 0.0 - 0.1 K/uL  Troponin I     Status: None   Collection Time: 03/07/17  3:40 PM  Result Value Ref Range   Troponin I <0.03 <0.03 ng/mL  Urinalysis, Routine w reflex  microscopic     Status: Abnormal   Collection Time: 03/07/17  3:55 PM  Result Value Ref Range   Color, Urine YELLOW YELLOW   APPearance CLEAR CLEAR   Specific Gravity, Urine 1.013 1.005 - 1.030   pH 6.5 5.0 - 8.0   Glucose, UA NEGATIVE NEGATIVE mg/dL   Hgb urine dipstick NEGATIVE NEGATIVE   Bilirubin Urine NEGATIVE NEGATIVE   Ketones, ur 15 (A) NEGATIVE mg/dL   Protein, ur NEGATIVE NEGATIVE mg/dL   Nitrite NEGATIVE NEGATIVE   Leukocytes, UA TRACE (A) NEGATIVE  Urinalysis, Microscopic (reflex)     Status: Abnormal   Collection Time: 03/07/17  3:55 PM  Result Value Ref Range   RBC / HPF 0-5 0 - 5 RBC/hpf   WBC, UA 6-30 0 - 5 WBC/hpf   Bacteria, UA MANY (A) NONE SEEN   Squamous Epithelial / LPF 0-5 (A) NONE SEEN   Mucous PRESENT   Urine culture     Status: Abnormal   Collection Time: 03/07/17  9:30 PM  Result Value Ref Range   Specimen Description URINE, CATHETERIZED    Special Requests NONE    Culture (A)     <10,000 COLONIES/mL INSIGNIFICANT GROWTH Performed at [facility], [address], [place], [place] [address]    Report Status 03/09/2017 FINAL   Culture, blood (Routine X 2) w Reflex to ID Panel     Status: None   Collection Time: 03/07/17 10:55 PM  Result Value Ref Range   Specimen Description BLOOD RIGHT ARM    Special Requests      BOTTLES DRAWN AEROBIC AND ANAEROBIC Blood Culture adequate volume   Culture NO GROWTH 5 DAYS    Report Status  03/12/2017 FINAL   Lipase, blood     Status: None   Collection Time: 03/07/17 10:56 PM  Result Value Ref Range   Lipase 25 11 - 51 U/L  Lactic acid, plasma     Status: Abnormal   Collection Time: 03/07/17 10:56 PM  Result Value Ref Range   Lactic Acid, Venous 3.8 (HH) 0.5 - 1.9 mmol/L    Comment: CRITICAL RESULT CALLED TO, READ BACK BY AND VERIFIED WITH: WHITEHORN,S RN 03/08/2017 0131 JORDANS   Culture, blood (Routine X 2) w Reflex to ID Panel     Status: None   Collection Time: 03/07/17 11:00 PM  Result Value Ref Range   Specimen Description BLOOD RIGHT WRIST    Special Requests      BOTTLES DRAWN AEROBIC ONLY Blood Culture adequate volume   Culture NO GROWTH 5 DAYS    Report Status 03/12/2017 FINAL   Basic metabolic panel     Status: Abnormal   Collection Time: 03/08/17  3:00 AM  Result Value Ref Range   Sodium 130 (L) 135 - 145 mmol/L   Potassium 3.7 3.5 - 5.1 mmol/L   Chloride 98 (L) 101 - 111 mmol/L   CO2 26 22 - 32 mmol/L   Glucose, Bld 138 (H) 65 - 99 mg/dL   BUN 15 6 - 20 mg/dL   Creatinine, Ser 0.95 0.61 - 1.24 mg/dL   Calcium 8.5 (L) 8.9 - 10.3 mg/dL   GFR calc non Af Amer >60 >60 mL/min   GFR calc Af Amer >60 >60 mL/min    Comment: (NOTE) The eGFR has been calculated using the CKD EPI equation. This calculation has not been validated in all clinical situations. eGFR's persistently <60 mL/min signify possible Chronic Kidney Disease.    Anion gap  6 5 - 15  CBC     Status: Abnormal   Collection Time: 03/08/17  3:00 AM  Result Value Ref Range   WBC 12.2 (H) 4.0 - 10.5 K/uL   RBC 3.78 (L) 4.22 - 5.81 MIL/uL   Hemoglobin 10.6 (L) 13.0 - 17.0 g/dL   HCT 31.8 (L) 39.0 - 52.0 %   MCV 84.1 78.0 - 100.0 fL   MCH 28.0 26.0 - 34.0 pg   MCHC 33.3 30.0 - 36.0 g/dL   RDW 14.1 11.5 - 15.5 %   Platelets 191 150 - 400 K/uL  Protime-INR     Status: None   Collection Time: 03/08/17  3:00 AM  Result Value Ref Range   Prothrombin Time 14.3 11.4 - 15.2 seconds   INR 1.11     Lactic acid, plasma     Status: None   Collection Time: 03/08/17  3:00 AM  Result Value Ref Range   Lactic Acid, Venous 1.9 0.5 - 1.9 mmol/L  Procalcitonin     Status: None   Collection Time: 03/08/17  3:00 AM  Result Value Ref Range   Procalcitonin 0.10 ng/mL    Comment:        Interpretation: PCT (Procalcitonin) <= 0.5 ng/mL: Systemic infection (sepsis) is not likely. Local bacterial infection is possible. (NOTE)         ICU PCT Algorithm               Non ICU PCT Algorithm    ----------------------------     ------------------------------         PCT < 0.25 ng/mL                 PCT < 0.1 ng/mL     Stopping of antibiotics            Stopping of antibiotics       strongly encouraged.               strongly encouraged.    ----------------------------     ------------------------------       PCT level decrease by               PCT < 0.25 ng/mL       >= 80% from peak PCT       OR PCT 0.25 - 0.5 ng/mL          Stopping of antibiotics                                             encouraged.     Stopping of antibiotics           encouraged.    ----------------------------     ------------------------------       PCT level decrease by              PCT >= 0.25 ng/mL       < 80% from peak PCT        AND PCT >= 0.5 ng/mL            Continuin g antibiotics                                              encouraged.  Continuing antibiotics            encouraged.    ----------------------------     ------------------------------     PCT level increase compared          PCT > 0.5 ng/mL         with peak PCT AND          PCT >= 0.5 ng/mL             Escalation of antibiotics                                          strongly encouraged.      Escalation of antibiotics        strongly encouraged.   Glucose, capillary     Status: Abnormal   Collection Time: 03/08/17  3:24 AM  Result Value Ref Range   Glucose-Capillary 137 (H) 65 - 99 mg/dL  Lactic acid, plasma     Status: None    Collection Time: 03/08/17  6:09 AM  Result Value Ref Range   Lactic Acid, Venous 1.5 0.5 - 1.9 mmol/L  Glucose, capillary     Status: Abnormal   Collection Time: 03/08/17  6:51 AM  Result Value Ref Range   Glucose-Capillary 140 (H) 65 - 99 mg/dL  Glucose, capillary     Status: Abnormal   Collection Time: 03/08/17 12:06 PM  Result Value Ref Range   Glucose-Capillary 169 (H) 65 - 99 mg/dL  Glucose, capillary     Status: Abnormal   Collection Time: 03/08/17  4:33 PM  Result Value Ref Range   Glucose-Capillary 202 (H) 65 - 99 mg/dL   Comment 1 Notify RN   Glucose, capillary     Status: Abnormal   Collection Time: 03/08/17 10:44 PM  Result Value Ref Range   Glucose-Capillary 190 (H) 65 - 99 mg/dL  Glucose, capillary     Status: Abnormal   Collection Time: 03/09/17  6:22 AM  Result Value Ref Range   Glucose-Capillary 171 (H) 65 - 99 mg/dL  CBC     Status: Abnormal   Collection Time: 03/09/17  7:58 AM  Result Value Ref Range   WBC 6.8 4.0 - 10.5 K/uL   RBC 3.70 (L) 4.22 - 5.81 MIL/uL   Hemoglobin 10.2 (L) 13.0 - 17.0 g/dL   HCT 31.3 (L) 39.0 - 52.0 %   MCV 84.6 78.0 - 100.0 fL   MCH 27.6 26.0 - 34.0 pg   MCHC 32.6 30.0 - 36.0 g/dL   RDW 14.2 11.5 - 15.5 %   Platelets 176 150 - 400 K/uL  Comprehensive metabolic panel     Status: Abnormal   Collection Time: 03/09/17  7:58 AM  Result Value Ref Range   Sodium 136 135 - 145 mmol/L   Potassium 3.8 3.5 - 5.1 mmol/L   Chloride 105 101 - 111 mmol/L   CO2 24 22 - 32 mmol/L   Glucose, Bld 184 (H) 65 - 99 mg/dL   BUN 13 6 - 20 mg/dL   Creatinine, Ser 1.06 0.61 - 1.24 mg/dL   Calcium 8.8 (L) 8.9 - 10.3 mg/dL   Total Protein 5.9 (L) 6.5 - 8.1 g/dL   Albumin 2.9 (L) 3.5 - 5.0 g/dL   AST 17 15 - 41 U/L   ALT 12 (L) 17 - 63 U/L   Alkaline Phosphatase 41 38 -  126 U/L   Total Bilirubin 0.6 0.3 - 1.2 mg/dL   GFR calc non Af Amer 59 (L) >60 mL/min   GFR calc Af Amer >60 >60 mL/min    Comment: (NOTE) The eGFR has been calculated using the  CKD EPI equation. This calculation has not been validated in all clinical situations. eGFR's persistently <60 mL/min signify possible Chronic Kidney Disease.    Anion gap 7 5 - 15  Osmolality     Status: None   Collection Time: 03/09/17  7:58 AM  Result Value Ref Range   Osmolality 292 275 - 295 mOsm/kg  Cortisol     Status: None   Collection Time: 03/09/17  7:58 AM  Result Value Ref Range   Cortisol, Plasma 13.3 ug/dL    Comment: (NOTE) AM    6.7 - 22.6 ug/dL PM   <10.0       ug/dL   TSH     Status: None   Collection Time: 03/09/17  7:58 AM  Result Value Ref Range   TSH 3.741 0.350 - 4.500 uIU/mL    Comment: Performed by a 3rd Generation assay with a functional sensitivity of <=0.01 uIU/mL.  Sodium, urine, random     Status: None   Collection Time: 03/09/17  9:53 AM  Result Value Ref Range   Sodium, Ur 47 mmol/L  Osmolality, urine     Status: Abnormal   Collection Time: 03/09/17  9:55 AM  Result Value Ref Range   Osmolality, Ur 168 (L) 300 - 900 mOsm/kg  Glucose, capillary     Status: Abnormal   Collection Time: 03/09/17 11:35 AM  Result Value Ref Range   Glucose-Capillary 181 (H) 65 - 99 mg/dL    Assessment/Plan: Anemia, unspecified type Repeat labs today to assess. Likely ACD. May need iron studies. - CBC w/Diff  Paroxysmal atrial fibrillation (HCC) BP stable. Asymptomatic. Patient followed by Cardiology (Dr. Marlou Porch). Had wanted to transfer to [facility] location due to distance from home. Will placed referral to [facility] to help schedule an appointment. No anticoagulation at present due to risk of falls and bleed. - Ambulatory referral to Cardiology  Nausea and vomiting, intractability of vomiting not specified, unspecified vomiting type Thought secondary to constipation noted on CT scan. Resolved in ER. No recurrence of symptoms. Appetite returned to normal. Will monitor.   Acute cystitis without hematuria Urine Culture during hospitalization with  insignificant growth. Was treated with Rocephin and Keflex. Culture will be checked today.  Sepsis, due to unspecified organism [facility]) Resolved clinically. Repeat labs today.    Leeanne Rio, PA-C

## 2017-03-14 ENCOUNTER — Ambulatory Visit (INDEPENDENT_AMBULATORY_CARE_PROVIDER_SITE_OTHER): Payer: Medicare Other | Admitting: Physician Assistant

## 2017-03-14 ENCOUNTER — Telehealth: Payer: Self-pay | Admitting: Cardiology

## 2017-03-14 ENCOUNTER — Encounter: Payer: Self-pay | Admitting: Physician Assistant

## 2017-03-14 VITALS — BP 110/60 | HR 62 | Temp 98.0°F | Resp 14 | Ht 66.0 in | Wt 160.0 lb

## 2017-03-14 DIAGNOSIS — N3000 Acute cystitis without hematuria: Secondary | ICD-10-CM

## 2017-03-14 DIAGNOSIS — R8290 Unspecified abnormal findings in urine: Secondary | ICD-10-CM | POA: Diagnosis not present

## 2017-03-14 DIAGNOSIS — I480 Paroxysmal atrial fibrillation: Secondary | ICD-10-CM | POA: Diagnosis not present

## 2017-03-14 DIAGNOSIS — Z87440 Personal history of urinary (tract) infections: Secondary | ICD-10-CM

## 2017-03-14 DIAGNOSIS — D649 Anemia, unspecified: Secondary | ICD-10-CM

## 2017-03-14 DIAGNOSIS — R112 Nausea with vomiting, unspecified: Secondary | ICD-10-CM

## 2017-03-14 DIAGNOSIS — A419 Sepsis, unspecified organism: Secondary | ICD-10-CM

## 2017-03-14 LAB — CBC WITH DIFFERENTIAL/PLATELET
BASOS ABS: 0 10*3/uL (ref 0.0–0.1)
Basophils Relative: 0.2 % (ref 0.0–3.0)
EOS PCT: 21.6 % — AB (ref 0.0–5.0)
Eosinophils Absolute: 3.2 10*3/uL — ABNORMAL HIGH (ref 0.0–0.7)
HCT: 37.4 % — ABNORMAL LOW (ref 39.0–52.0)
Hemoglobin: 12.4 g/dL — ABNORMAL LOW (ref 13.0–17.0)
LYMPHS ABS: 3.4 10*3/uL (ref 0.7–4.0)
Lymphocytes Relative: 22.7 % (ref 12.0–46.0)
MCHC: 33.2 g/dL (ref 30.0–36.0)
MCV: 85.7 fl (ref 78.0–100.0)
MONOS PCT: 7 % (ref 3.0–12.0)
Monocytes Absolute: 1 10*3/uL (ref 0.1–1.0)
NEUTROS ABS: 7.3 10*3/uL (ref 1.4–7.7)
NEUTROS PCT: 48.5 % (ref 43.0–77.0)
PLATELETS: 255 10*3/uL (ref 150.0–400.0)
RBC: 4.36 Mil/uL (ref 4.22–5.81)
RDW: 15.6 % — AB (ref 11.5–15.5)
WBC: 15 10*3/uL — ABNORMAL HIGH (ref 4.0–10.5)

## 2017-03-14 NOTE — Telephone Encounter (Signed)
Follow up has been scheduled with dr Stanford Breed

## 2017-03-14 NOTE — Telephone Encounter (Signed)
Ok  Gerald Holt  

## 2017-03-14 NOTE — Patient Instructions (Addendum)
Please keep well hydrated and get plenty of rest.  Continue chronic medications as directed. Cut back on the stool softener to one per day. If stools still loose, cut back to every other day use.   I will call you with your results.  Schedule a follow-up with Dr. Ree Edman. I am having Angela set you back up with Cardiology for follow-up.  Follow-up with me in 1 week.

## 2017-03-14 NOTE — Telephone Encounter (Signed)
°  New Prob   [facility] calling over to establish care with Dr. Stanford Breed in [place]. Currently pt is established with Dr. Marlou Porch. Wanting to switch providers due to diagnosis. Per telephone note from 01/05/16, Dr. Marlou Porch already [facility] transfer of care. Sending note today to get OK from Dr. Stanford Breed as [facility] called over today to schedule appointment.

## 2017-03-15 LAB — URINE CULTURE: Organism ID, Bacteria: NO GROWTH

## 2017-03-16 ENCOUNTER — Other Ambulatory Visit: Payer: Self-pay | Admitting: Physician Assistant

## 2017-03-16 DIAGNOSIS — D649 Anemia, unspecified: Secondary | ICD-10-CM

## 2017-03-16 DIAGNOSIS — D72829 Elevated white blood cell count, unspecified: Secondary | ICD-10-CM

## 2017-03-17 ENCOUNTER — Telehealth: Payer: Self-pay | Admitting: Emergency Medicine

## 2017-03-17 ENCOUNTER — Encounter: Payer: Self-pay | Admitting: Physician Assistant

## 2017-03-17 ENCOUNTER — Other Ambulatory Visit (INDEPENDENT_AMBULATORY_CARE_PROVIDER_SITE_OTHER): Payer: Medicare Other

## 2017-03-17 ENCOUNTER — Emergency Department (HOSPITAL_BASED_OUTPATIENT_CLINIC_OR_DEPARTMENT_OTHER)
Admission: EM | Admit: 2017-03-17 | Discharge: 2017-03-17 | Disposition: A | Discharge status: Home / Self Care | Payer: Medicare Other | Attending: Emergency Medicine | Admitting: Emergency Medicine

## 2017-03-17 ENCOUNTER — Encounter (HOSPITAL_BASED_OUTPATIENT_CLINIC_OR_DEPARTMENT_OTHER): Payer: Self-pay | Admitting: *Deleted

## 2017-03-17 DIAGNOSIS — Z7982 Long term (current) use of aspirin: Secondary | ICD-10-CM | POA: Diagnosis not present

## 2017-03-17 DIAGNOSIS — Z7984 Long term (current) use of oral hypoglycemic drugs: Secondary | ICD-10-CM | POA: Insufficient documentation

## 2017-03-17 DIAGNOSIS — E11319 Type 2 diabetes mellitus with unspecified diabetic retinopathy without macular edema: Secondary | ICD-10-CM | POA: Diagnosis not present

## 2017-03-17 DIAGNOSIS — R197 Diarrhea, unspecified: Secondary | ICD-10-CM | POA: Diagnosis not present

## 2017-03-17 DIAGNOSIS — D649 Anemia, unspecified: Secondary | ICD-10-CM | POA: Diagnosis not present

## 2017-03-17 DIAGNOSIS — I10 Essential (primary) hypertension: Secondary | ICD-10-CM | POA: Insufficient documentation

## 2017-03-17 DIAGNOSIS — D72829 Elevated white blood cell count, unspecified: Secondary | ICD-10-CM | POA: Insufficient documentation

## 2017-03-17 DIAGNOSIS — E039 Hypothyroidism, unspecified: Secondary | ICD-10-CM | POA: Diagnosis not present

## 2017-03-17 DIAGNOSIS — Z79899 Other long term (current) drug therapy: Secondary | ICD-10-CM | POA: Insufficient documentation

## 2017-03-17 DIAGNOSIS — Z954 Presence of other heart-valve replacement: Secondary | ICD-10-CM | POA: Diagnosis not present

## 2017-03-17 DIAGNOSIS — Z87891 Personal history of nicotine dependence: Secondary | ICD-10-CM | POA: Insufficient documentation

## 2017-03-17 LAB — CBC WITH DIFFERENTIAL/PLATELET
BASOS PCT: 0.4 % (ref 0.0–3.0)
Basophils Absolute: 0.1 10*3/uL (ref 0.0–0.1)
Eosinophils Absolute: 6.5 10*3/uL — ABNORMAL HIGH (ref 0.0–0.7)
Eosinophils Relative: 31 % — ABNORMAL HIGH (ref 0.0–5.0)
HEMATOCRIT: 36.9 % — AB (ref 39.0–52.0)
HEMOGLOBIN: 12.3 g/dL — AB (ref 13.0–17.0)
LYMPHS PCT: 20.5 % (ref 12.0–46.0)
Lymphs Abs: 4.3 10*3/uL — ABNORMAL HIGH (ref 0.7–4.0)
MCHC: 33.4 g/dL (ref 30.0–36.0)
MCV: 85.9 fl (ref 78.0–100.0)
Monocytes Absolute: 1.4 10*3/uL — ABNORMAL HIGH (ref 0.1–1.0)
Monocytes Relative: 6.6 % (ref 3.0–12.0)
NEUTROS ABS: 8.7 10*3/uL — AB (ref 1.4–7.7)
Neutrophils Relative %: 41.5 % — ABNORMAL LOW (ref 43.0–77.0)
PLATELETS: 235 10*3/uL (ref 150.0–400.0)
RBC: 4.3 Mil/uL (ref 4.22–5.81)
RDW: 15 % (ref 11.5–15.5)

## 2017-03-17 LAB — COMPREHENSIVE METABOLIC PANEL
ALBUMIN: 3.7 g/dL (ref 3.5–5.2)
ALK PHOS: 66 U/L (ref 39–117)
ALT: 12 U/L (ref 0–53)
AST: 13 U/L (ref 0–37)
BUN: 17 mg/dL (ref 6–23)
CALCIUM: 10.1 mg/dL (ref 8.4–10.5)
CHLORIDE: 99 meq/L (ref 96–112)
CO2: 26 meq/L (ref 19–32)
Creatinine, Ser: 1.13 mg/dL (ref 0.40–1.50)
GFR: 64.6 mL/min (ref >60.00)
Glucose, Bld: 189 mg/dL — ABNORMAL HIGH (ref 70–99)
Potassium: 4.7 mEq/L (ref 3.5–5.1)
Sodium: 134 mEq/L — ABNORMAL LOW (ref 135–145)
Total Bilirubin: 0.3 mg/dL (ref 0.2–1.2)
Total Protein: 6.6 g/dL (ref 6.0–8.3)

## 2017-03-17 LAB — IBC PANEL
Iron: 56 ug/dL (ref 42–165)
Saturation Ratios: 19.5 % — ABNORMAL LOW (ref 20.0–50.0)
Transferrin: 205 mg/dL — ABNORMAL LOW (ref 212.0–360.0)

## 2017-03-17 LAB — URINALYSIS, ROUTINE W REFLEX MICROSCOPIC
Bilirubin Urine: NEGATIVE
Glucose, UA: NEGATIVE mg/dL
HGB URINE DIPSTICK: NEGATIVE
Ketones, ur: NEGATIVE mg/dL
LEUKOCYTES UA: NEGATIVE
Nitrite: NEGATIVE
PROTEIN: NEGATIVE mg/dL
SPECIFIC GRAVITY, URINE: 1.007 (ref 1.005–1.030)
pH: 6.5 (ref 5.0–8.0)

## 2017-03-17 NOTE — ED Triage Notes (Signed)
Pt's wife reports being called by PCP today and instructed to come to ED for elevated WBC and anemia. Denies fever, n/v. Also reports diarrhea.

## 2017-03-17 NOTE — ED Provider Notes (Signed)
[facility] DEPT [facility] Provider Note   CSN: [id number] Arrival date & time: 03/17/17  1751  By signing my name below, I, [name], attest that this documentation has been prepared under the direction and in the presence of Gerald Dakin, MD. Electronically signed, [name], ED Scribe. 03/17/17. 7:15 PM.  History   Chief Complaint Chief Complaint  Patient presents with  . Diarrhea  . abnormal labs    abnormal labs   HPI HPI Comments: LEVEL 5 CAVEAT DUE TO DEMENTIA  Gerald Holt is a 83 y.o. male, with PMHx of Dementia, DM, recurrent UTI's, who presents to the Emergency Department, brought in by his wife with concerns regarding an elevated WBC count. Pt was seen by his PCP three days ago and wife reports that he had an elevated WBC count from blood work that he had done that day. Wife states that the pt's PCP wanted him to come back in for a follow up this week. Pt has had one episode of runny stool today. He did not have any loose stool two days prior. Last week, 6/18-6/22, wife states that the pt had one to two episodes of diarrhea every day. Patient looks normal to his wife. Pt's wife states that she usually cath's the pt 4 times a day at home because the pt is usually unable to void on his own. She has brought a urine sample of the pt's from 1730 today. Wife reports that the pt is able to ambulate on his own and has had no difficulty doing so today. He's had no vomiting. He is presently hungry. He denies pain anywhere Pt has had adequate oral intake today. No vomiting today. Pt does not complain of pain anywhere currently . No cough no abdominal pain no other associated symptoms  The history is provided by the spouse. No language interpreter was used.    Past Medical History:  Diagnosis Date  . A-fib ([place])   . Cataracts, bilateral   . Chronic dermatitis   . Diabetes type 2, controlled ([place]) 2000  . History of chicken pox   . Mumps    Adult  . Retinopathy   . Shingles     . Stroke [facility]) Nov 2011  . Undescended testicle, unilateral    Right  . Whooping cough     Patient Active Problem List   Diagnosis Date Noted  . UTI (urinary tract infection) 03/07/2017  . Essential hypertension 03/07/2017  . Nausea & vomiting 02/18/2017  . Weakness generalized 02/18/2017  . Weakness 02/18/2017  . Intermittent self-catheterization of bladder 11/22/2016  . Enlarged prostate 11/22/2016  . Urinary retention 10/31/2016  . Diabetes mellitus with complication ([place])   . Renal insufficiency 10/25/2016  . Sepsis ([place]) 10/24/2016  . Encounter for therapeutic drug monitoring 10/20/2016  . Hx: UTI (urinary tract infection) 10/20/2016  . Hypothyroidism 09/15/2016  . Gastroesophageal reflux disease without esophagitis 07/09/2015  . Falls 06/22/2015  . Diabetes mellitus type II, uncontrolled ([place]) 05/29/2015  . Paroxysmal atrial fibrillation ([place]) 05/29/2015  . HLD (hyperlipidemia) 05/29/2015  . Hx of completed stroke 05/29/2015    Past Surgical History:  Procedure Laterality Date  . CARDIAC VALVE REPLACEMENT    . PRE-MALIGNANT / BENIGN SKIN LESION EXCISION    . TESTICLE SURGERY     Right, undescended  . TOOTH EXTRACTION         Home Medications    Prior to Admission medications   Medication Sig Start Date End Date Taking? Authorizing Provider  aspirin  81 MG tablet Take 1 tablet (81 mg total) by mouth daily. 06/22/15  Yes Jerline Pain, MD  Black Elderberry,Berry-Flower, 575 MG CAPS Take 1 capsule by mouth 3 (three) times daily with meals.   Yes [provider]  Calcium Carbonate Antacid (ALKA-SELTZER ANTACID PO) Take 2 tablets by mouth daily as needed (for indegestion).    Yes [provider]  Cranberry 1000 MG CAPS Take 2 capsules by mouth daily.   Yes [provider]  cyanocobalamin (,VITAMIN B-12,) 1000 MCG/ML injection Inject 1000 mcg intramuscular daily for 7 days, then once a week for 4 weeks, then once a month for a  year Patient taking differently: Inject 1,000 mcg into the muscle every 30 (thirty) days.  04/02/16  Yes Jaffe, Adam R, DO  docusate sodium (COLACE) 100 MG capsule Take 200 mg by mouth daily as needed for mild constipation.    Yes [provider]  Eyelid Cleansers (STERILID EX) Apply 1 application topically See admin instructions. Eye Wash: Once Daily    Yes [provider]  glucose blood test strip One Touch Verio strips Use as instructed to check fasting sugars once daily .Dx:E11.9 11/20/16  Yes [name], PA-C  levothyroxine (SYNTHROID, LEVOTHROID) 50 MCG tablet Take 1 tablet (50 mcg total) by mouth daily. 04/09/16  Yes [name], PA-C  lisinopril (PRINIVIL,ZESTRIL) 2.5 MG tablet Take 1 tablet by mouth daily. 12/02/16  Yes [provider]  metFORMIN (GLUCOPHAGE-XR) 500 MG 24 hr tablet TAKE 2 TABLETS WITH BREAKFAST AND AT BEDTIME 02/27/17  Yes [name], PA-C  metoprolol tartrate (LOPRESSOR) 25 MG tablet Take 0.5 tablets (12.5 mg total) by mouth 2 (two) times daily. 04/09/16  Yes [name], PA-C  Miconazole Nitrate (TRIPLE PASTE AF) 2 % OINT Apply 1 application topically daily as needed (mouth care).    Yes [provider]  Omega-3 Fatty Acids (SB OMEGA-3 FISH OIL) 1000 MG CAPS Take 1,000 mg by mouth daily. Reported on 10/06/2015   Yes [provider]  OVER THE COUNTER MEDICATION Take 1 capsule by mouth 2 (two) times daily. OTC Focus Select   Yes [provider]  triamcinolone cream (KENALOG) 0.1 % Apply 1 application topically daily as needed (for itching).  02/01/16  Yes [provider]  lovastatin (MEVACOR) 10 MG tablet TAKE 1 TABLET AT BEDTIME 02/27/17   [name], PA-C  pantoprazole (PROTONIX) 40 MG tablet Take 1 tablet (40 mg total) by mouth daily. 03/10/17   Jani Gravel, MD  polyethylene glycol [facility] / Floria Raveling) packet Take 17 g by mouth daily as needed for moderate constipation or severe  constipation. 03/09/17   Jani Gravel, MD    Family History Family History  Problem Relation Age of Onset  . Pneumonia Mother 13       Deceased  . GI Bleed Father 78       Deceased - Ulcers  . Diabetes Cousin   . Diabetes Brother   . Cancer Paternal Aunt     Social History Social History  Substance Use Topics  . Smoking status: Former Research scientist (life sciences)  . Smokeless tobacco: Never Used     Comment: Hasn't smoked since age 61  . Alcohol use No    Allergies   Flomax [tamsulosin hcl]; Shellfish allergy; Aspirin-dipyridamole er; and Other   Review of Systems Review of Systems  Unable to perform ROS: Dementia  Genitourinary:       Requires catheterization to urinate  Physical Exam Updated Vital Signs BP 128/64 (BP Location: Right Arm)   Pulse 69   Temp 97.7 F (36.5 C) (Oral)   Resp 18   Ht 5\' 6"  (1.676 m)   Wt 160 lb (72.6 kg)   SpO2 94%   BMI 25.82 kg/m   Physical Exam  Constitutional: He appears well-developed and well-nourished. No distress.  HENT:  Head: Normocephalic and atraumatic.  Eyes: Conjunctivae are normal. Pupils are equal, round, and reactive to light.  Neck: Neck supple. No tracheal deviation present. No thyromegaly present.  Cardiovascular: Normal rate and regular rhythm.   No murmur heard. Pulmonary/Chest: Effort normal and breath sounds normal.  Abdominal: Soft. Bowel sounds are normal. He exhibits no distension. There is no tenderness.  Genitourinary: Penis normal.  Musculoskeletal: Normal range of motion. He exhibits no edema or tenderness.  Neurological: He is alert. Coordination normal.  Skin: Skin is warm and dry. No rash noted.  Psychiatric: He has a normal mood and affect.  Nursing note and vitals reviewed.    ED Treatments / Results  DIAGNOSTIC STUDIES: Oxygen Saturation is 94% on RA, normal by my interpretation.  COORDINATION OF CARE: 7:12 PM-Discussed treatment plan with pt at bedside and pt agreed to plan.   Labs (all labs  ordered are listed, but only abnormal results are displayed) Labs Reviewed - No data to display  EKG  EKG Interpretation None       Radiology No results found.  Procedures Procedures (including critical care time)  Medications Ordered in ED Medications - No data to display Results for orders placed or performed during the hospital encounter of 03/17/17  Urinalysis, Routine w reflex microscopic  Result Value Ref Range   Color, Urine YELLOW YELLOW   APPearance CLEAR CLEAR   Specific Gravity, Urine 1.007 1.005 - 1.030   pH 6.5 5.0 - 8.0   Glucose, UA NEGATIVE NEGATIVE mg/dL   Hgb urine dipstick NEGATIVE NEGATIVE   Bilirubin Urine NEGATIVE NEGATIVE   Ketones, ur NEGATIVE NEGATIVE mg/dL   Protein, ur NEGATIVE NEGATIVE mg/dL   Nitrite NEGATIVE NEGATIVE   Leukocytes, UA NEGATIVE NEGATIVE   Ct Abdomen Pelvis W Contrast  Result Date: 03/08/2017 CLINICAL DATA:  Nausea vomiting and constipation EXAM: CT ABDOMEN AND PELVIS WITH CONTRAST TECHNIQUE: Multidetector CT imaging of the abdomen and pelvis was performed using the standard protocol following bolus administration of intravenous contrast. CONTRAST:  180mL ISOVUE-300 IOPAMIDOL (ISOVUE-300) INJECTION 61% COMPARISON:  03/07/2017 radiograph FINDINGS: Lower chest: Lung bases demonstrate streaky dependent atelectasis. No pleural effusion. Coronary artery calcifications. No pericardial effusion. Mitral annular calcification. Hepatobiliary: Calcified gallstone. No biliary dilatation. No focal hepatic abnormality Pancreas: Unremarkable. No pancreatic ductal dilatation or surrounding inflammatory changes. Spleen: Normal in size without focal abnormality. Adrenals/Urinary Tract: Adrenal glands are unremarkable. Kidneys are normal, without renal calculi, focal lesion, or hydronephrosis. Foley catheter in the bladder with small amount of air in the bladder. Stomach/Bowel: The stomach is nonenlarged. There is no evidence for a bowel obstruction. No  significant colon wall thickening. Moderate stool in the colon. Normal appendix. Mild feces impaction in the rectum Vascular/Lymphatic: Aortic atherosclerosis. No enlarged abdominal or pelvic lymph nodes. Reproductive: No masses Other: No free air or free fluid. Musculoskeletal: Degenerative changes of the spine. No acute or suspicious bone lesion. IMPRESSION: 1. Negative for bowel obstruction or bowel wall thickening. Moderate stool in the colon with moderate feces retention in the rectum. 2. Gallstones Electronically Signed   By: Donavan Foil M.D.   On: 03/08/2017  03:08   Dg Chest Port 1 View  Result Date: 02/18/2017 CLINICAL DATA:  83 year old male with weakness. History of diabetes. EXAM: PORTABLE CHEST 1 VIEW COMPARISON:  Chest radiograph dated 12/27/2016 FINDINGS: There is shallow inspiration with bibasilar atelectatic changes. No focal consolidation, pleural effusion, or pneumothorax. The cardiac silhouette appears stable in similar to prior study. There is atherosclerotic calcification of the aortic arch. No acute osseous pathology. IMPRESSION: No active disease. Electronically Signed   By: Anner Crete M.D.   On: 02/18/2017 00:15   Dg Abd Acute W/chest  Result Date: 03/07/2017 CLINICAL DATA:  Vomiting and fever. EXAM: DG ABDOMEN ACUTE W/ 1V CHEST COMPARISON:  Chest x-ray on 02/18/2017 FINDINGS: There is no evidence of pulmonary edema, consolidation, pneumothorax, nodule or pleural fluid. The heart size is normal. Abdominal films show no evidence of acute bowel obstruction or free intraperitoneal air. There are a few air-fluid levels in small bowel which may be consistent with enteritis. Scattered stool throughout the colon without evidence of fecal impaction. Bony structures are unremarkable. The splenic artery is calcified. IMPRESSION: Scattered air-fluid levels in small bowel which may be consistent with enteritis. No evidence of small bowel obstruction or free intraperitoneal air.  Electronically Signed   By: Aletta Edouard M.D.   On: 03/07/2017 16:40    Initial Impression / Assessment and Plan / ED Course  I have reviewed the triage vital signs and the nursing notes.  Pertinent labs & imaging results that were available during my care of the patient were reviewed by me and considered in my medical decision making (see chart for details).     8:25 PM patient continues to look well. Offers no complaint. He ate while here. Plan blood cultures 2 pending. No further treatment needed today follow-up with Mr. Hassell Done, [place]  Final Clinical Impressions(s) / ED Diagnoses  Diagnosis leukocytosis Final diagnoses:  None    New Prescriptions New Prescriptions   No medications on file      Gerald Dakin, MD 03/17/17 2031

## 2017-03-17 NOTE — Discharge Instructions (Signed)
Blood cultures are pending. We will call you if they are abnormal. Call Gerald Holt tomorrow to arrange for a follow-up visit. Return if Gerald Holt condition worsens for any reason or if you are concerned

## 2017-03-17 NOTE — Telephone Encounter (Signed)
Per Dr Birdie Riddle and Einar Pheasant advised patient to go back to the ER. Informed patient wife, she understands.   I contacted patient wife Elane Fritz of status of patient. She states he has been feeling tired and fatigued today. He was more alert and more active. Today not as active, very drowsy, nodding off a lot. His stools have been mucus and loose.   Charline Bills Dr Birdie Riddle who informed [place]-PCP  [facility] lab calling with critical lab of WBC 21.0

## 2017-03-17 NOTE — ED Notes (Signed)
EDP ok'd use of cathed urine specimen from home. Specimen was obtained at 1720 per pt's wife and was refrigerated and kept in cooler.

## 2017-03-18 ENCOUNTER — Other Ambulatory Visit: Payer: Self-pay | Admitting: Physician Assistant

## 2017-03-18 DIAGNOSIS — D72829 Elevated white blood cell count, unspecified: Secondary | ICD-10-CM

## 2017-03-18 DIAGNOSIS — D721 Eosinophilia, unspecified: Secondary | ICD-10-CM

## 2017-03-19 ENCOUNTER — Telehealth: Payer: Self-pay | Admitting: *Deleted

## 2017-03-19 NOTE — Telephone Encounter (Signed)
Spoke with patient's wife. He is up and eating and hydrating. Has been resting more so this afternoon. No fever at present. Wife is closely monitoring. Discussed strict ER precautions and she voices understanding and agreement. Patient has office follow-up tomorrow.

## 2017-03-19 NOTE — Telephone Encounter (Signed)
Patient's wife called to report that patient had difficulty waking this morning. She states that he has just seemed really sleepy and not wanting to get going.  Fever was 99.5.     I spoke with PCP and he advised that they see how alert he is - if needed, EMS can be called to assess patient as well.   If he is not alert and doing well, patient needs to go to ER.  Patient's wife stated verbal understanding. She said that he just got out of bed so she would see how he does. She is aware of the urgency to go to ER if he is not alert/oriented - also aware that it might be good to call EMS.    She stated she will call us back if she needs anything further.   Routed to PCP just as Juluis Rainier

## 2017-03-20 ENCOUNTER — Other Ambulatory Visit: Payer: Medicare Other

## 2017-03-20 ENCOUNTER — Encounter: Payer: Self-pay | Admitting: Physician Assistant

## 2017-03-20 ENCOUNTER — Telehealth: Payer: Self-pay | Admitting: *Deleted

## 2017-03-20 ENCOUNTER — Ambulatory Visit (INDEPENDENT_AMBULATORY_CARE_PROVIDER_SITE_OTHER): Payer: Medicare Other | Admitting: Physician Assistant

## 2017-03-20 VITALS — BP 118/70 | HR 98 | Temp 97.6°F | Resp 14 | Ht 66.0 in | Wt 160.0 lb

## 2017-03-20 DIAGNOSIS — D72829 Elevated white blood cell count, unspecified: Secondary | ICD-10-CM

## 2017-03-20 LAB — CBC WITH DIFFERENTIAL/PLATELET
BASOS PCT: 0.4 % (ref 0.0–3.0)
Basophils Absolute: 0.1 10*3/uL (ref 0.0–0.1)
EOS ABS: 5.4 10*3/uL — AB (ref 0.0–0.7)
Eosinophils Relative: 24 % — ABNORMAL HIGH (ref 0.0–5.0)
HCT: 38.9 % — ABNORMAL LOW (ref 39.0–52.0)
Hemoglobin: 13.1 g/dL (ref 13.0–17.0)
LYMPHS ABS: 4.1 10*3/uL — AB (ref 0.7–4.0)
Lymphocytes Relative: 18.1 % (ref 12.0–46.0)
MCHC: 33.8 g/dL (ref 30.0–36.0)
MCV: 85.7 fl (ref 78.0–100.0)
MONO ABS: 1.1 10*3/uL — AB (ref 0.1–1.0)
Monocytes Relative: 4.7 % (ref 3.0–12.0)
NEUTROS ABS: 11.8 10*3/uL — AB (ref 1.4–7.7)
NEUTROS PCT: 52.8 % (ref 43.0–77.0)
PLATELETS: 220 10*3/uL (ref 150.0–400.0)
RBC: 4.54 Mil/uL (ref 4.22–5.81)
RDW: 15.5 % (ref 11.5–15.5)

## 2017-03-20 LAB — POCT URINALYSIS DIPSTICK
Bilirubin, UA: NEGATIVE
GLUCOSE UA: NEGATIVE
KETONES UA: NEGATIVE
Leukocytes, UA: NEGATIVE
Nitrite, UA: NEGATIVE
Protein, UA: NEGATIVE
RBC UA: NEGATIVE
SPEC GRAV UA: 1.015 (ref 1.010–1.025)
UROBILINOGEN UA: 0.2 U/dL
pH, UA: 6 (ref 5.0–8.0)

## 2017-03-20 LAB — COMPREHENSIVE METABOLIC PANEL
ALT: 11 U/L (ref 0–53)
AST: 12 U/L (ref 0–37)
Albumin: 3.9 g/dL (ref 3.5–5.2)
Alkaline Phosphatase: 68 U/L (ref 39–117)
BUN: 16 mg/dL (ref 6–23)
CHLORIDE: 100 meq/L (ref 96–112)
CO2: 25 meq/L (ref 19–32)
CREATININE: 1.05 mg/dL (ref 0.40–1.50)
Calcium: 10 mg/dL (ref 8.4–10.5)
GFR: 70.31 mL/min (ref >60.00)
Glucose, Bld: 191 mg/dL — ABNORMAL HIGH (ref 70–99)
POTASSIUM: 5.4 meq/L — AB (ref 3.5–5.1)
SODIUM: 137 meq/L (ref 135–145)
Total Bilirubin: 0.3 mg/dL (ref 0.2–1.2)
Total Protein: 6.6 g/dL (ref 6.0–8.3)

## 2017-03-20 NOTE — Progress Notes (Signed)
Pre visit review using our clinic review tool, if applicable. No additional management support is needed unless otherwise documented below in the visit note. 

## 2017-03-20 NOTE — Telephone Encounter (Signed)
Noted.  Pt was seen in office today and when he was sent to ER the other day w/ similar WBC, he was sent home.  Will not send back to ER but will discuss w/ his PCP tomorrow regarding next steps

## 2017-03-20 NOTE — Progress Notes (Signed)
Patient presents to clinic today c/o for follow-up of leukocytosis. At last visit CBC obtained revealing WBC count further increased from 15 to 21 with mild eosinophilia. Patient was sent to the ER due to worsening leukocytosis and recent history of urosepsis. ER workup included labs and unremarkable UA. Patient was sent home to follow-up with [name] without any further assessment of laboratory findings. Since discharge, patient and family note that patient has had some increased fatigue. has noted some fever the other day that has since resolved. Endorse patient is eating and hydrating well. There are having successful cath at home without cloudy urine. Wife does note patient seems to be having a few more bowel movements per day than his usual. Patient and caregivers deny melena, tenesmus or hematochezia. A referral has already been placed with Hematology regarding the eosinophilia with concern for malignancy. Appointment is pending.   Past Medical History:  Diagnosis Date  . A-fib ([place])   . Cataracts, bilateral   . Chronic dermatitis   . Diabetes type 2, controlled ([place]) 2000  . History of chicken pox   . Mumps    Adult  . Retinopathy   . Shingles   . Stroke [facility]) Nov 2011  . Undescended testicle, unilateral    Right  . Whooping cough     Current Outpatient Prescriptions on File Prior to Visit  Medication Sig Dispense Refill  . aspirin 81 MG tablet Take 1 tablet (81 mg total) by mouth daily.    . Black Elderberry,Berry-Flower, 575 MG CAPS Take 1 capsule by mouth 3 (three) times daily with meals.    . Calcium Carbonate Antacid (ALKA-SELTZER ANTACID PO) Take 2 tablets by mouth daily as needed (for indegestion).     . Cranberry 1000 MG CAPS Take 2 capsules by mouth daily.    . cyanocobalamin (,VITAMIN B-12,) 1000 MCG/ML injection Inject 1000 mcg intramuscular daily for 7 days, then once a week for 4 weeks, then once a month for a year (Patient taking differently: Inject 1,000 mcg into the  muscle every 30 (thirty) days. ) 25 mL 0  . docusate sodium (COLACE) 100 MG capsule Take 200 mg by mouth daily as needed for mild constipation.     . Eyelid Cleansers (STERILID EX) Apply 1 application topically See admin instructions. Eye Wash: Once Daily     . glucose blood test strip One Touch Verio strips Use as instructed to check fasting sugars once daily .Dx:E11.9 100 each 12  . levothyroxine (SYNTHROID, LEVOTHROID) 50 MCG tablet Take 1 tablet (50 mcg total) by mouth daily. 90 tablet 3  . lisinopril (PRINIVIL,ZESTRIL) 2.5 MG tablet Take 1 tablet by mouth daily.    Marland Kitchen lovastatin (MEVACOR) 10 MG tablet TAKE 1 TABLET AT BEDTIME 90 tablet 1  . metFORMIN (GLUCOPHAGE-XR) 500 MG 24 hr tablet TAKE 2 TABLETS WITH BREAKFAST AND AT BEDTIME 360 tablet 1  . metoprolol tartrate (LOPRESSOR) 25 MG tablet Take 0.5 tablets (12.5 mg total) by mouth 2 (two) times daily. 180 tablet 1  . Miconazole Nitrate (TRIPLE PASTE AF) 2 % OINT Apply 1 application topically daily as needed (mouth care).     . Omega-3 Fatty Acids (SB OMEGA-3 FISH OIL) 1000 MG CAPS Take 1,000 mg by mouth daily. Reported on 10/06/2015    . OVER THE COUNTER MEDICATION Take 1 capsule by mouth 2 (two) times daily. OTC Focus Select    . pantoprazole (PROTONIX) 40 MG tablet Take 1 tablet (40 mg total) by mouth daily. 30 tablet  0  . polyethylene glycol (MIRALAX / GLYCOLAX) packet Take 17 g by mouth daily as needed for moderate constipation or severe constipation. 14 each 0  . triamcinolone cream (KENALOG) 0.1 % Apply 1 application topically daily as needed (for itching).      No current facility-administered medications on file prior to visit.     Allergies  Allergen Reactions  . Flomax [Tamsulosin Hcl] Other (See Comments)    Hypotension  . Shellfish Allergy Anaphylaxis  . Aspirin-Dipyridamole Er Other (See Comments)    Headaches   . Other Other (See Comments)    Blood Thinner given in Rehab gave H/As - Not Coumadin/Headaches      Family  History  Problem Relation Age of Onset  . Pneumonia Mother 59       Deceased  . GI Bleed Father 37       Deceased - Ulcers  . Diabetes Cousin   . Diabetes Brother   . Cancer Paternal Aunt     Social History   Social History  . Marital status: Married    Spouse name: N/A  . Number of children: N/A  . Years of education: N/A   Social History Main Topics  . Smoking status: Former Research scientist (life sciences)  . Smokeless tobacco: Never Used     Comment: Hasn't smoked since age 49  . Alcohol use No  . Drug use: No  . Sexual activity: Not on file   Other Topics Concern  . Not on file   Social History Narrative   Lives with wife in a one story home.  Has 1 child.  Retired Armed forces technical officer.  Education: Oceanographer.      Review of Systems - See HPI.  All other ROS are negative.  There were no vitals taken for this visit.  Physical Exam  Constitutional: He is oriented to person, place, and time and well-developed, well-nourished, and in no distress.  HENT:  Head: Normocephalic and atraumatic.  Eyes: Conjunctivae are normal.  Neck: Neck supple.  Cardiovascular: Normal heart sounds and intact distal pulses.   Pulmonary/Chest: Effort normal and breath sounds normal. No respiratory distress. He has no wheezes. He has no rales. He exhibits no tenderness.  Abdominal: Soft. Bowel sounds are normal. He exhibits no distension and no mass. There is no tenderness. There is no rebound and no guarding.  Lymphadenopathy:    He has no cervical adenopathy.  Neurological: He is alert and oriented to person, place, and time. Gait normal.  Skin: Skin is warm and dry. No rash noted.  Psychiatric: Affect normal.    Recent Results (from the past 2160 hour(s))  Urinalysis, Routine w reflex microscopic     Status: None   Collection Time: 12/27/16  9:59 AM  Result Value Ref Range   Color, Urine YELLOW YELLOW   APPearance CLEAR CLEAR   Specific Gravity, Urine 1.009 1.005 - 1.030   pH 7.0 5.0 - 8.0   Glucose, UA  NEGATIVE NEGATIVE mg/dL   Hgb urine dipstick NEGATIVE NEGATIVE   Bilirubin Urine NEGATIVE NEGATIVE   Ketones, ur NEGATIVE NEGATIVE mg/dL   Protein, ur NEGATIVE NEGATIVE mg/dL   Nitrite NEGATIVE NEGATIVE   Leukocytes, UA NEGATIVE NEGATIVE    Comment: Microscopic not done on urines with negative protein, blood, leukocytes, nitrite, or glucose < 500 mg/dL.  Urine culture     Status: None   Collection Time: 12/27/16  9:59 AM  Result Value Ref Range   Specimen Description URINE, CLEAN CATCH  Special Requests NONE    Culture      NO GROWTH Performed at [facility], [place] [address] [place], [place] [address]    Report Status 12/28/2016 FINAL   Comprehensive metabolic panel     Status: Abnormal   Collection Time: 12/27/16 10:27 AM  Result Value Ref Range   Sodium 133 (L) 135 - 145 mmol/L   Potassium 4.2 3.5 - 5.1 mmol/L   Chloride 96 (L) 101 - 111 mmol/L   CO2 26 22 - 32 mmol/L   Glucose, Bld 192 (H) 65 - 99 mg/dL   BUN 14 6 - 20 mg/dL   Creatinine, Ser 1.07 0.61 - 1.24 mg/dL   Calcium 9.6 8.9 - 10.3 mg/dL   Total Protein 7.3 6.5 - 8.1 g/dL   Albumin 3.8 3.5 - 5.0 g/dL   AST 25 15 - 41 U/L   ALT 19 17 - 63 U/L   Alkaline Phosphatase 45 38 - 126 U/L   Total Bilirubin 0.7 0.3 - 1.2 mg/dL   GFR calc non Af Amer 59 (L) >60 mL/min   GFR calc Af Amer >60 >60 mL/min    Comment: (NOTE) The eGFR has been calculated using the CKD EPI equation. This calculation has not been validated in all clinical situations. eGFR's persistently <60 mL/min signify possible Chronic Kidney Disease.    Anion gap 11 5 - 15  CBC with Differential/Platelet     Status: Abnormal   Collection Time: 12/27/16 10:27 AM  Result Value Ref Range   WBC 7.3 4.0 - 10.5 K/uL   RBC 4.31 4.22 - 5.81 MIL/uL   Hemoglobin 12.7 (L) 13.0 - 17.0 g/dL   HCT 36.9 (L) 39.0 - 52.0 %   MCV 85.6 78.0 - 100.0 fL   MCH 29.5 26.0 - 34.0 pg   MCHC 34.4 30.0 - 36.0 g/dL   RDW 14.2 11.5 - 15.5 %   Platelets 191 150 - 400  K/uL   Neutrophils Relative % 67 %   Neutro Abs 4.9 1.7 - 7.7 K/uL   Lymphocytes Relative 10 %   Lymphs Abs 0.8 0.7 - 4.0 K/uL   Monocytes Relative 13 %   Monocytes Absolute 1.0 0.1 - 1.0 K/uL   Eosinophils Relative 9 %   Eosinophils Absolute 0.6 0.0 - 0.7 K/uL   Basophils Relative 1 %   Basophils Absolute 0.1 0.0 - 0.1 K/uL  POCT Urinalysis Dipstick     Status: Abnormal   Collection Time: 01/22/17 11:47 AM  Result Value Ref Range   Color, UA yellow    Clarity, UA cloudy    Glucose, UA negative    Bilirubin, UA negative    Ketones, UA negative    Spec Grav, UA 1.010 1.010 - 1.025   Blood, UA +-    pH, UA 6.5 5.0 - 8.0   Protein, UA negative    Urobilinogen, UA 0.2 0.2 or 1.0 E.U./dL   Nitrite, UA negative    Leukocytes, UA Large (3+) (A) Negative  Urine culture     Status: None   Collection Time: 01/22/17  1:49 PM  Result Value Ref Range   Organism ID, Bacteria NO GROWTH   Comprehensive metabolic panel     Status: Abnormal   Collection Time: 02/17/17 11:43 PM  Result Value Ref Range   Sodium 133 (L) 135 - 145 mmol/L   Potassium 4.6 3.5 - 5.1 mmol/L   Chloride 97 (L) 101 - 111 mmol/L   CO2 22 22 -  32 mmol/L   Glucose, Bld 196 (H) 65 - 99 mg/dL   BUN 19 6 - 20 mg/dL   Creatinine, Ser 1.03 0.61 - 1.24 mg/dL   Calcium 9.5 8.9 - 10.3 mg/dL   Total Protein 6.6 6.5 - 8.1 g/dL   Albumin 3.5 3.5 - 5.0 g/dL   AST 23 15 - 41 U/L   ALT 17 17 - 63 U/L   Alkaline Phosphatase 45 38 - 126 U/L   Total Bilirubin 0.7 0.3 - 1.2 mg/dL   GFR calc non Af Amer >60 >60 mL/min   GFR calc Af Amer >60 >60 mL/min    Comment: (NOTE) The eGFR has been calculated using the CKD EPI equation. This calculation has not been validated in all clinical situations. eGFR's persistently <60 mL/min signify possible Chronic Kidney Disease.    Anion gap 14 5 - 15  Lipase, blood     Status: None   Collection Time: 02/17/17 11:43 PM  Result Value Ref Range   Lipase 30 11 - 51 U/L  Troponin I     Status:  None   Collection Time: 02/17/17 11:43 PM  Result Value Ref Range   Troponin I <0.03 <0.03 ng/mL  CBC with Differential     Status: Abnormal   Collection Time: 02/17/17 11:43 PM  Result Value Ref Range   WBC 19.8 (H) 4.0 - 10.5 K/uL   RBC 4.47 4.22 - 5.81 MIL/uL   Hemoglobin 12.6 (L) 13.0 - 17.0 g/dL   HCT 37.5 (L) 39.0 - 52.0 %   MCV 83.9 78.0 - 100.0 fL   MCH 28.2 26.0 - 34.0 pg   MCHC 33.6 30.0 - 36.0 g/dL   RDW 14.2 11.5 - 15.5 %   Platelets 227 150 - 400 K/uL   Neutrophils Relative % 84 %   Neutro Abs 16.6 (H) 1.7 - 7.7 K/uL   Lymphocytes Relative 5 %   Lymphs Abs 1.1 0.7 - 4.0 K/uL   Monocytes Relative 7 %   Monocytes Absolute 1.4 (H) 0.1 - 1.0 K/uL   Eosinophils Relative 4 %   Eosinophils Absolute 0.7 0.0 - 0.7 K/uL   Basophils Relative 0 %   Basophils Absolute 0.0 0.0 - 0.1 K/uL  Urinalysis, Routine w reflex microscopic     Status: Abnormal   Collection Time: 02/18/17 12:00 AM  Result Value Ref Range   Color, Urine YELLOW YELLOW   APPearance CLEAR CLEAR   Specific Gravity, Urine 1.010 1.005 - 1.030   pH 6.0 5.0 - 8.0   Glucose, UA NEGATIVE NEGATIVE mg/dL   Hgb urine dipstick NEGATIVE NEGATIVE   Bilirubin Urine NEGATIVE NEGATIVE   Ketones, ur 5 (A) NEGATIVE mg/dL   Protein, ur NEGATIVE NEGATIVE mg/dL   Nitrite NEGATIVE NEGATIVE   Leukocytes, UA MODERATE (A) NEGATIVE   RBC / HPF 0-5 0 - 5 RBC/hpf   WBC, UA TOO NUMEROUS TO COUNT 0 - 5 WBC/hpf   Bacteria, UA NONE SEEN NONE SEEN   Squamous Epithelial / LPF NONE SEEN NONE SEEN  Culture, Urine     Status: Abnormal   Collection Time: 02/18/17 12:00 AM  Result Value Ref Range   Specimen Description URINE, RANDOM    Special Requests NONE    Culture 60,000 COLONIES/mL ENTEROCOCCUS FAECALIS (A)    Report Status 02/20/2017 FINAL    Organism ID, Bacteria ENTEROCOCCUS FAECALIS (A)       Susceptibility   Enterococcus faecalis - MIC*    AMPICILLIN <=2 SENSITIVE Sensitive  LEVOFLOXACIN 1 SENSITIVE Sensitive      NITROFURANTOIN <=16 SENSITIVE Sensitive     VANCOMYCIN 1 SENSITIVE Sensitive     * 60,000 COLONIES/mL ENTEROCOCCUS FAECALIS  Glucose, capillary     Status: Abnormal   Collection Time: 02/18/17  4:18 AM  Result Value Ref Range   Glucose-Capillary 186 (H) 65 - 99 mg/dL  CK     Status: Abnormal   Collection Time: 02/18/17  4:47 AM  Result Value Ref Range   Total CK 11 (L) 49 - 397 U/L  TSH     Status: None   Collection Time: 02/18/17  4:47 AM  Result Value Ref Range   TSH 1.197 0.350 - 4.500 uIU/mL    Comment: Performed by a 3rd Generation assay with a functional sensitivity of <=0.01 uIU/mL.  Troponin I     Status: None   Collection Time: 02/18/17  4:47 AM  Result Value Ref Range   Troponin I <0.03 <0.03 ng/mL  CBC     Status: Abnormal   Collection Time: 02/18/17  4:47 AM  Result Value Ref Range   WBC 16.9 (H) 4.0 - 10.5 K/uL   RBC 4.13 (L) 4.22 - 5.81 MIL/uL   Hemoglobin 11.7 (L) 13.0 - 17.0 g/dL   HCT 34.9 (L) 39.0 - 52.0 %   MCV 84.5 78.0 - 100.0 fL   MCH 28.3 26.0 - 34.0 pg   MCHC 33.5 30.0 - 36.0 g/dL   RDW 14.2 11.5 - 15.5 %   Platelets 212 150 - 400 K/uL  Creatinine, serum     Status: None   Collection Time: 02/18/17  4:47 AM  Result Value Ref Range   Creatinine, Ser 1.04 0.61 - 1.24 mg/dL   GFR calc non Af Amer >60 >60 mL/min   GFR calc Af Amer >60 >60 mL/min    Comment: (NOTE) The eGFR has been calculated using the CKD EPI equation. This calculation has not been validated in all clinical situations. eGFR's persistently <60 mL/min signify possible Chronic Kidney Disease.   Glucose, capillary     Status: Abnormal   Collection Time: 02/18/17  8:02 AM  Result Value Ref Range   Glucose-Capillary 217 (H) 65 - 99 mg/dL  Glucose, capillary     Status: Abnormal   Collection Time: 02/18/17 11:41 AM  Result Value Ref Range   Glucose-Capillary 226 (H) 65 - 99 mg/dL  Glucose, capillary     Status: Abnormal   Collection Time: 02/18/17  5:19 PM  Result Value Ref Range    Glucose-Capillary 158 (H) 65 - 99 mg/dL  Glucose, capillary     Status: Abnormal   Collection Time: 02/18/17  8:59 PM  Result Value Ref Range   Glucose-Capillary 198 (H) 65 - 99 mg/dL  Glucose, capillary     Status: Abnormal   Collection Time: 02/19/17  7:47 AM  Result Value Ref Range   Glucose-Capillary 171 (H) 65 - 99 mg/dL  Glucose, capillary     Status: Abnormal   Collection Time: 02/19/17 12:36 PM  Result Value Ref Range   Glucose-Capillary 187 (H) 65 - 99 mg/dL  Glucose, capillary     Status: Abnormal   Collection Time: 02/19/17  5:25 PM  Result Value Ref Range   Glucose-Capillary 151 (H) 65 - 99 mg/dL  Glucose, capillary     Status: Abnormal   Collection Time: 02/19/17  8:09 PM  Result Value Ref Range   Glucose-Capillary 157 (H) 65 - 99 mg/dL  Glucose, capillary  Status: Abnormal   Collection Time: 02/19/17 10:31 PM  Result Value Ref Range   Glucose-Capillary 164 (H) 65 - 99 mg/dL  Glucose, capillary     Status: Abnormal   Collection Time: 02/20/17  5:26 AM  Result Value Ref Range   Glucose-Capillary 175 (H) 65 - 99 mg/dL   Comment 1 Notify RN    Comment 2 Document in Chart   CBC     Status: Abnormal   Collection Time: 02/20/17  5:37 AM  Result Value Ref Range   WBC 9.0 4.0 - 10.5 K/uL   RBC 4.02 (L) 4.22 - 5.81 MIL/uL   Hemoglobin 11.2 (L) 13.0 - 17.0 g/dL   HCT 33.5 (L) 39.0 - 52.0 %   MCV 83.3 78.0 - 100.0 fL   MCH 27.9 26.0 - 34.0 pg   MCHC 33.4 30.0 - 36.0 g/dL   RDW 14.2 11.5 - 15.5 %   Platelets 195 150 - 400 K/uL  Basic metabolic panel     Status: Abnormal   Collection Time: 02/20/17  5:37 AM  Result Value Ref Range   Sodium 136 135 - 145 mmol/L   Potassium 4.0 3.5 - 5.1 mmol/L   Chloride 102 101 - 111 mmol/L   CO2 25 22 - 32 mmol/L   Glucose, Bld 181 (H) 65 - 99 mg/dL   BUN 16 6 - 20 mg/dL   Creatinine, Ser 0.99 0.61 - 1.24 mg/dL   Calcium 9.2 8.9 - 10.3 mg/dL   GFR calc non Af Amer >60 >60 mL/min   GFR calc Af Amer >60 >60 mL/min    Comment:  (NOTE) The eGFR has been calculated using the CKD EPI equation. This calculation has not been validated in all clinical situations. eGFR's persistently <60 mL/min signify possible Chronic Kidney Disease.    Anion gap 9 5 - 15  Glucose, capillary     Status: Abnormal   Collection Time: 02/20/17  7:55 AM  Result Value Ref Range   Glucose-Capillary 182 (H) 65 - 99 mg/dL  Glucose, capillary     Status: Abnormal   Collection Time: 02/20/17 12:04 PM  Result Value Ref Range   Glucose-Capillary 273 (H) 65 - 99 mg/dL  Hemoglobin A1c     Status: Abnormal   Collection Time: 03/05/17 12:04 PM  Result Value Ref Range   Hgb A1c MFr Bld 7.2 (H) 4.6 - 6.5 %    Comment: Glycemic Control Guidelines for People with Diabetes:Non Diabetic:  <6%Goal of Therapy: <7%Additional Action Suggested:  >8%   Comprehensive metabolic panel     Status: Abnormal   Collection Time: 03/07/17  3:40 PM  Result Value Ref Range   Sodium 133 (L) 135 - 145 mmol/L   Potassium 4.5 3.5 - 5.1 mmol/L   Chloride 96 (L) 101 - 111 mmol/L   CO2 22 22 - 32 mmol/L   Glucose, Bld 132 (H) 65 - 99 mg/dL   BUN 20 6 - 20 mg/dL   Creatinine, Ser 1.00 0.61 - 1.24 mg/dL   Calcium 9.6 8.9 - 10.3 mg/dL   Total Protein 7.1 6.5 - 8.1 g/dL   Albumin 3.7 3.5 - 5.0 g/dL   AST 31 15 - 41 U/L   ALT 18 17 - 63 U/L   Alkaline Phosphatase 43 38 - 126 U/L   Total Bilirubin 1.0 0.3 - 1.2 mg/dL   GFR calc non Af Amer >60 >60 mL/min   GFR calc Af Amer >60 >60 mL/min    Comment: (  NOTE) The eGFR has been calculated using the CKD EPI equation. This calculation has not been validated in all clinical situations. eGFR's persistently <60 mL/min signify possible Chronic Kidney Disease.    Anion gap 15 5 - 15  CBC with Differential     Status: Abnormal   Collection Time: 03/07/17  3:40 PM  Result Value Ref Range   WBC 19.3 (H) 4.0 - 10.5 K/uL   RBC 4.32 4.22 - 5.81 MIL/uL   Hemoglobin 12.5 (L) 13.0 - 17.0 g/dL   HCT 35.8 (L) 39.0 - 52.0 %   MCV 82.9  78.0 - 100.0 fL   MCH 28.9 26.0 - 34.0 pg   MCHC 34.9 30.0 - 36.0 g/dL   RDW 14.1 11.5 - 15.5 %   Platelets 225 150 - 400 K/uL   Neutrophils Relative % 84 %   Neutro Abs 16.2 (H) 1.7 - 7.7 K/uL   Lymphocytes Relative 8 %   Lymphs Abs 1.5 0.7 - 4.0 K/uL   Monocytes Relative 4 %   Monocytes Absolute 0.7 0.1 - 1.0 K/uL   Eosinophils Relative 4 %   Eosinophils Absolute 0.7 0.0 - 0.7 K/uL   Basophils Relative 0 %   Basophils Absolute 0.1 0.0 - 0.1 K/uL  Troponin I     Status: None   Collection Time: 03/07/17  3:40 PM  Result Value Ref Range   Troponin I <0.03 <0.03 ng/mL  Urinalysis, Routine w reflex microscopic     Status: Abnormal   Collection Time: 03/07/17  3:55 PM  Result Value Ref Range   Color, Urine YELLOW YELLOW   APPearance CLEAR CLEAR   Specific Gravity, Urine 1.013 1.005 - 1.030   pH 6.5 5.0 - 8.0   Glucose, UA NEGATIVE NEGATIVE mg/dL   Hgb urine dipstick NEGATIVE NEGATIVE   Bilirubin Urine NEGATIVE NEGATIVE   Ketones, ur 15 (A) NEGATIVE mg/dL   Protein, ur NEGATIVE NEGATIVE mg/dL   Nitrite NEGATIVE NEGATIVE   Leukocytes, UA TRACE (A) NEGATIVE  Urinalysis, Microscopic (reflex)     Status: Abnormal   Collection Time: 03/07/17  3:55 PM  Result Value Ref Range   RBC / HPF 0-5 0 - 5 RBC/hpf   WBC, UA 6-30 0 - 5 WBC/hpf   Bacteria, UA MANY (A) NONE SEEN   Squamous Epithelial / LPF 0-5 (A) NONE SEEN   Mucous PRESENT   Urine culture     Status: Abnormal   Collection Time: 03/07/17  9:30 PM  Result Value Ref Range   Specimen Description URINE, CATHETERIZED    Special Requests NONE    Culture (A)     <10,000 COLONIES/mL INSIGNIFICANT GROWTH Performed at [facility], [address], [place], [place] [address]    Report Status 03/09/2017 FINAL   Culture, blood (Routine X 2) w Reflex to ID Panel     Status: None   Collection Time: 03/07/17 10:55 PM  Result Value Ref Range   Specimen Description BLOOD RIGHT ARM    Special Requests      BOTTLES DRAWN AEROBIC  AND ANAEROBIC Blood Culture adequate volume   Culture NO GROWTH 5 DAYS    Report Status 03/12/2017 FINAL   Lipase, blood     Status: None   Collection Time: 03/07/17 10:56 PM  Result Value Ref Range   Lipase 25 11 - 51 U/L  Lactic acid, plasma     Status: Abnormal   Collection Time: 03/07/17 10:56 PM  Result Value Ref Range  Lactic Acid, Venous 3.8 (HH) 0.5 - 1.9 mmol/L    Comment: CRITICAL RESULT CALLED TO, READ BACK BY AND VERIFIED WITH: WHITEHORN,S RN 03/08/2017 0131 JORDANS   Culture, blood (Routine X 2) w Reflex to ID Panel     Status: None   Collection Time: 03/07/17 11:00 PM  Result Value Ref Range   Specimen Description BLOOD RIGHT WRIST    Special Requests      BOTTLES DRAWN AEROBIC ONLY Blood Culture adequate volume   Culture NO GROWTH 5 DAYS    Report Status 03/12/2017 FINAL   Basic metabolic panel     Status: Abnormal   Collection Time: 03/08/17  3:00 AM  Result Value Ref Range   Sodium 130 (L) 135 - 145 mmol/L   Potassium 3.7 3.5 - 5.1 mmol/L   Chloride 98 (L) 101 - 111 mmol/L   CO2 26 22 - 32 mmol/L   Glucose, Bld 138 (H) 65 - 99 mg/dL   BUN 15 6 - 20 mg/dL   Creatinine, Ser 0.95 0.61 - 1.24 mg/dL   Calcium 8.5 (L) 8.9 - 10.3 mg/dL   GFR calc non Af Amer >60 >60 mL/min   GFR calc Af Amer >60 >60 mL/min    Comment: (NOTE) The eGFR has been calculated using the CKD EPI equation. This calculation has not been validated in all clinical situations. eGFR's persistently <60 mL/min signify possible Chronic Kidney Disease.    Anion gap 6 5 - 15  CBC     Status: Abnormal   Collection Time: 03/08/17  3:00 AM  Result Value Ref Range   WBC 12.2 (H) 4.0 - 10.5 K/uL   RBC 3.78 (L) 4.22 - 5.81 MIL/uL   Hemoglobin 10.6 (L) 13.0 - 17.0 g/dL   HCT 31.8 (L) 39.0 - 52.0 %   MCV 84.1 78.0 - 100.0 fL   MCH 28.0 26.0 - 34.0 pg   MCHC 33.3 30.0 - 36.0 g/dL   RDW 14.1 11.5 - 15.5 %   Platelets 191 150 - 400 K/uL  Protime-INR     Status: None   Collection Time: 03/08/17   3:00 AM  Result Value Ref Range   Prothrombin Time 14.3 11.4 - 15.2 seconds   INR 1.11   Lactic acid, plasma     Status: None   Collection Time: 03/08/17  3:00 AM  Result Value Ref Range   Lactic Acid, Venous 1.9 0.5 - 1.9 mmol/L  Procalcitonin     Status: None   Collection Time: 03/08/17  3:00 AM  Result Value Ref Range   Procalcitonin 0.10 ng/mL    Comment:        Interpretation: PCT (Procalcitonin) <= 0.5 ng/mL: Systemic infection (sepsis) is not likely. Local bacterial infection is possible. (NOTE)         ICU PCT Algorithm               Non ICU PCT Algorithm    ----------------------------     ------------------------------         PCT < 0.25 ng/mL                 PCT < 0.1 ng/mL     Stopping of antibiotics            Stopping of antibiotics       strongly encouraged.               strongly encouraged.    ----------------------------     ------------------------------  PCT level decrease by               PCT < 0.25 ng/mL       >= 80% from peak PCT       OR PCT 0.25 - 0.5 ng/mL          Stopping of antibiotics                                             encouraged.     Stopping of antibiotics           encouraged.    ----------------------------     ------------------------------       PCT level decrease by              PCT >= 0.25 ng/mL       < 80% from peak PCT        AND PCT >= 0.5 ng/mL            Continuin g antibiotics                                              encouraged.       Continuing antibiotics            encouraged.    ----------------------------     ------------------------------     PCT level increase compared          PCT > 0.5 ng/mL         with peak PCT AND          PCT >= 0.5 ng/mL             Escalation of antibiotics                                          strongly encouraged.      Escalation of antibiotics        strongly encouraged.   Glucose, capillary     Status: Abnormal   Collection Time: 03/08/17  3:24 AM  Result Value Ref  Range   Glucose-Capillary 137 (H) 65 - 99 mg/dL  Lactic acid, plasma     Status: None   Collection Time: 03/08/17  6:09 AM  Result Value Ref Range   Lactic Acid, Venous 1.5 0.5 - 1.9 mmol/L  Glucose, capillary     Status: Abnormal   Collection Time: 03/08/17  6:51 AM  Result Value Ref Range   Glucose-Capillary 140 (H) 65 - 99 mg/dL  Glucose, capillary     Status: Abnormal   Collection Time: 03/08/17 12:06 PM  Result Value Ref Range   Glucose-Capillary 169 (H) 65 - 99 mg/dL  Glucose, capillary     Status: Abnormal   Collection Time: 03/08/17  4:33 PM  Result Value Ref Range   Glucose-Capillary 202 (H) 65 - 99 mg/dL   Comment 1 Notify RN   Glucose, capillary     Status: Abnormal   Collection Time: 03/08/17 10:44 PM  Result Value Ref Range   Glucose-Capillary 190 (H) 65 - 99 mg/dL  Glucose, capillary     Status: Abnormal   Collection Time: 03/09/17  6:22  AM  Result Value Ref Range   Glucose-Capillary 171 (H) 65 - 99 mg/dL  CBC     Status: Abnormal   Collection Time: 03/09/17  7:58 AM  Result Value Ref Range   WBC 6.8 4.0 - 10.5 K/uL   RBC 3.70 (L) 4.22 - 5.81 MIL/uL   Hemoglobin 10.2 (L) 13.0 - 17.0 g/dL   HCT 31.3 (L) 39.0 - 52.0 %   MCV 84.6 78.0 - 100.0 fL   MCH 27.6 26.0 - 34.0 pg   MCHC 32.6 30.0 - 36.0 g/dL   RDW 14.2 11.5 - 15.5 %   Platelets 176 150 - 400 K/uL  Comprehensive metabolic panel     Status: Abnormal   Collection Time: 03/09/17  7:58 AM  Result Value Ref Range   Sodium 136 135 - 145 mmol/L   Potassium 3.8 3.5 - 5.1 mmol/L   Chloride 105 101 - 111 mmol/L   CO2 24 22 - 32 mmol/L   Glucose, Bld 184 (H) 65 - 99 mg/dL   BUN 13 6 - 20 mg/dL   Creatinine, Ser 1.06 0.61 - 1.24 mg/dL   Calcium 8.8 (L) 8.9 - 10.3 mg/dL   Total Protein 5.9 (L) 6.5 - 8.1 g/dL   Albumin 2.9 (L) 3.5 - 5.0 g/dL   AST 17 15 - 41 U/L   ALT 12 (L) 17 - 63 U/L   Alkaline Phosphatase 41 38 - 126 U/L   Total Bilirubin 0.6 0.3 - 1.2 mg/dL   GFR calc non Af Amer 59 (L) >60 mL/min    GFR calc Af Amer >60 >60 mL/min    Comment: (NOTE) The eGFR has been calculated using the CKD EPI equation. This calculation has not been validated in all clinical situations. eGFR's persistently <60 mL/min signify possible Chronic Kidney Disease.    Anion gap 7 5 - 15  Osmolality     Status: None   Collection Time: 03/09/17  7:58 AM  Result Value Ref Range   Osmolality 292 275 - 295 mOsm/kg  Cortisol     Status: None   Collection Time: 03/09/17  7:58 AM  Result Value Ref Range   Cortisol, Plasma 13.3 ug/dL    Comment: (NOTE) AM    6.7 - 22.6 ug/dL PM   <10.0       ug/dL   TSH     Status: None   Collection Time: 03/09/17  7:58 AM  Result Value Ref Range   TSH 3.741 0.350 - 4.500 uIU/mL    Comment: Performed by a 3rd Generation assay with a functional sensitivity of <=0.01 uIU/mL.  Sodium, urine, random     Status: None   Collection Time: 03/09/17  9:53 AM  Result Value Ref Range   Sodium, Ur 47 mmol/L  Osmolality, urine     Status: Abnormal   Collection Time: 03/09/17  9:55 AM  Result Value Ref Range   Osmolality, Ur 168 (L) 300 - 900 mOsm/kg  Glucose, capillary     Status: Abnormal   Collection Time: 03/09/17 11:35 AM  Result Value Ref Range   Glucose-Capillary 181 (H) 65 - 99 mg/dL  CBC w/Diff     Status: Abnormal   Collection Time: 03/14/17 12:16 PM  Result Value Ref Range   WBC 15.0 (H) 4.0 - 10.5 K/uL   RBC 4.36 4.22 - 5.81 Mil/uL   Hemoglobin 12.4 (L) 13.0 - 17.0 g/dL   HCT 37.4 (L) 39.0 - 52.0 %   MCV 85.7 78.0 -  100.0 fl   MCHC 33.2 30.0 - 36.0 g/dL   RDW 15.6 (H) 11.5 - 15.5 %   Platelets 255.0 150.0 - 400.0 K/uL   Neutrophils Relative % 48.5 43.0 - 77.0 %   Lymphocytes Relative 22.7 12.0 - 46.0 %   Monocytes Relative 7.0 3.0 - 12.0 %   Eosinophils Relative 21.6 (H) 0.0 - 5.0 %    Comment: manual diff=51seg,16lymph,45moo,21eos   Basophils Relative 0.2 0.0 - 3.0 %   Neutro Abs 7.3 1.4 - 7.7 K/uL   Lymphs Abs 3.4 0.7 - 4.0 K/uL   Monocytes Absolute 1.0  0.1 - 1.0 K/uL   Eosinophils Absolute 3.2 (H) 0.0 - 0.7 K/uL   Basophils Absolute 0.0 0.0 - 0.1 K/uL  Urine Culture     Status: None   Collection Time: 03/14/17 12:18 PM  Result Value Ref Range   Organism ID, Bacteria NO GROWTH   CBC w/Diff     Status: Abnormal   Collection Time: 03/17/17  2:48 PM  Result Value Ref Range   WBC 21.0 Repeated and verified X2. (HH) 4.0 - 10.5 K/uL   RBC 4.30 4.22 - 5.81 Mil/uL   Hemoglobin 12.3 (L) 13.0 - 17.0 g/dL   HCT 36.9 (L) 39.0 - 52.0 %   MCV 85.9 78.0 - 100.0 fl   MCHC 33.4 30.0 - 36.0 g/dL   RDW 15.0 11.5 - 15.5 %   Platelets 235.0 150.0 - 400.0 K/uL   Neutrophils Relative % 41.5 (L) 43.0 - 77.0 %   Lymphocytes Relative 20.5 12.0 - 46.0 %   Monocytes Relative 6.6 3.0 - 12.0 %   Eosinophils Relative 31.0 (H) 0.0 - 5.0 %    Comment: A Manual Differential was performed and is consistent with the Automated Differential.   Basophils Relative 0.4 0.0 - 3.0 %   Neutro Abs 8.7 (H) 1.4 - 7.7 K/uL   Lymphs Abs 4.3 (H) 0.7 - 4.0 K/uL   Monocytes Absolute 1.4 (H) 0.1 - 1.0 K/uL   Eosinophils Absolute 6.5 (H) 0.0 - 0.7 K/uL   Basophils Absolute 0.1 0.0 - 0.1 K/uL  Comp Met (CMET)     Status: Abnormal   Collection Time: 03/17/17  2:48 PM  Result Value Ref Range   Sodium 134 (L) 135 - 145 mEq/L   Potassium 4.7 3.5 - 5.1 mEq/L   Chloride 99 96 - 112 mEq/L   CO2 26 19 - 32 mEq/L   Glucose, Bld 189 (H) 70 - 99 mg/dL   BUN 17 6 - 23 mg/dL   Creatinine, Ser 1.13 0.40 - 1.50 mg/dL   Total Bilirubin 0.3 0.2 - 1.2 mg/dL   Alkaline Phosphatase 66 39 - 117 U/L   AST 13 0 - 37 U/L   ALT 12 0 - 53 U/L   Total Protein 6.6 6.0 - 8.3 g/dL   Albumin 3.7 3.5 - 5.2 g/dL   Calcium 10.1 8.4 - 10.5 mg/dL   GFR 64.60 >60.00 mL/min  IBC panel     Status: Abnormal   Collection Time: 03/17/17  2:48 PM  Result Value Ref Range   Iron 56 42 - 165 ug/dL   Transferrin 205.0 (L) 212.0 - 360.0 mg/dL   Saturation Ratios 19.5 (L) 20.0 - 50.0 %  Urinalysis, Routine w reflex  microscopic     Status: None   Collection Time: 03/17/17  5:20 PM  Result Value Ref Range   Color, Urine YELLOW YELLOW   APPearance CLEAR CLEAR   Specific Gravity,  Urine 1.007 1.005 - 1.030   pH 6.5 5.0 - 8.0   Glucose, UA NEGATIVE NEGATIVE mg/dL   Hgb urine dipstick NEGATIVE NEGATIVE   Bilirubin Urine NEGATIVE NEGATIVE   Ketones, ur NEGATIVE NEGATIVE mg/dL   Protein, ur NEGATIVE NEGATIVE mg/dL   Nitrite NEGATIVE NEGATIVE   Leukocytes, UA NEGATIVE NEGATIVE    Comment: Microscopic not done on urines with negative protein, blood, leukocytes, nitrite, or glucose < 500 mg/dL.  Blood culture (routine x 2)     Status: None (Preliminary result)   Collection Time: 03/17/17  6:25 PM  Result Value Ref Range   Specimen Description BLOOD RIGHT ANTECUBITAL    Special Requests      BOTTLES DRAWN AEROBIC AND ANAEROBIC Blood Culture adequate volume   Culture      NO GROWTH 2 DAYS Performed at [facility], [place] [address] [place], [place] [address]    Report Status PENDING   Blood culture (routine x 2)     Status: None (Preliminary result)   Collection Time: 03/17/17  7:50 PM  Result Value Ref Range   Specimen Description BLOOD RIGHT HAND    Special Requests      BOTTLES DRAWN AEROBIC AND ANAEROBIC Blood Culture adequate volume   Culture      NO GROWTH 2 DAYS Performed at [facility], [place] [address] [place], [place] [address]    Report Status PENDING     Assessment/Plan: 1. Leukocytosis, unspecified type Hematology appointment pending. Infectious versus Malignancy. Blood cultures drawn in ER are negative for growth at 4 days. Vitals stable today. Unremarkable examination. Will obtain repeat CBC today and a CMP. Will check Stool Culture, ova and parasite, and C diff assay as there is increased risk of c. Diff due to the amount of antibiotics patient has been on for UTI and sepsis recently.  Very strict ER precautions given to patient and family. They voice understanding. - POCT  Urinalysis Dipstick - CBC w/Diff - Comp Met (CMET) - Stool culture - Ova and parasite examination - Clostridium Difficile by PCR   Leeanne Rio, PA-C

## 2017-03-20 NOTE — Telephone Encounter (Signed)
Message sent to patient's wife about levels. Will discuss tomorrow with supervising MD to discuss next steps.

## 2017-03-20 NOTE — Patient Instructions (Signed)
Please go to the lab for blood work. I will call you with your results. We will alter regimen based on findings. Keep Gerald Holt well-hydrated. Continue regular diet -- encourage intake of food.  You will be contacted for appointment with Hematology.  If labs are worsening we may have to send Gerald Holt back to the ER. If you notice he is not continuing improve, please go to the ER.

## 2017-03-20 NOTE — Telephone Encounter (Signed)
[facility] called with a critical on Mr. Maston.   WBC is at 22.4 today.  Routed to Dr. Birdie Riddle to advise in PCP's absence.

## 2017-03-21 ENCOUNTER — Ambulatory Visit: Payer: Medicare Other | Admitting: Physician Assistant

## 2017-03-21 ENCOUNTER — Encounter: Payer: Self-pay | Admitting: Physician Assistant

## 2017-03-21 ENCOUNTER — Telehealth: Payer: Self-pay | Admitting: Emergency Medicine

## 2017-03-21 ENCOUNTER — Other Ambulatory Visit: Payer: Self-pay | Admitting: Physician Assistant

## 2017-03-21 LAB — CLOSTRIDIUM DIFFICILE BY PCR: Toxigenic C. Difficile by PCR: DETECTED — CR

## 2017-03-21 LAB — OVA AND PARASITE EXAMINATION: OP: NONE SEEN

## 2017-03-21 MED ORDER — VANCOMYCIN HCL 125 MG PO CAPS: 125.0000 mg | ORAL_CAPSULE | Freq: Four times a day (QID) | ORAL | 0 refills | Status: DC

## 2017-03-21 NOTE — Telephone Encounter (Signed)
"  CRITICAL VALUE STICKER  CRITICAL VALUE:C. Diff Positive  RECEIVER (on-site recipient of call):Nyiah Pianka  DATE & TIME NOTIFIED: 03-21-17 @ 1100  MESSENGER (representative from lab):Gwyndolyn Saxon from [facility]  MD NOTIFIED: Einar Pheasant TIME OF NOTIFICATION:1108  RESPONSE:

## 2017-03-22 LAB — CULTURE, BLOOD (ROUTINE X 2)
Culture: NO GROWTH
Culture: NO GROWTH
SPECIAL REQUESTS: ADEQUATE
SPECIAL REQUESTS: ADEQUATE

## 2017-03-23 DIAGNOSIS — D72829 Elevated white blood cell count, unspecified: Secondary | ICD-10-CM | POA: Insufficient documentation

## 2017-03-24 ENCOUNTER — Encounter: Payer: Self-pay | Admitting: Physician Assistant

## 2017-03-24 ENCOUNTER — Encounter (HOSPITAL_BASED_OUTPATIENT_CLINIC_OR_DEPARTMENT_OTHER): Payer: Self-pay | Admitting: *Deleted

## 2017-03-24 ENCOUNTER — Ambulatory Visit: Payer: Medicare Other | Admitting: Physician Assistant

## 2017-03-24 ENCOUNTER — Emergency Department (HOSPITAL_BASED_OUTPATIENT_CLINIC_OR_DEPARTMENT_OTHER)
Admission: EM | Admit: 2017-03-24 | Discharge: 2017-03-24 | Disposition: A | Discharge status: Home / Self Care | Payer: Medicare Other | Attending: Emergency Medicine | Admitting: Emergency Medicine

## 2017-03-24 DIAGNOSIS — Z79899 Other long term (current) drug therapy: Secondary | ICD-10-CM | POA: Diagnosis not present

## 2017-03-24 DIAGNOSIS — Z7984 Long term (current) use of oral hypoglycemic drugs: Secondary | ICD-10-CM | POA: Insufficient documentation

## 2017-03-24 DIAGNOSIS — Z954 Presence of other heart-valve replacement: Secondary | ICD-10-CM | POA: Diagnosis not present

## 2017-03-24 DIAGNOSIS — E039 Hypothyroidism, unspecified: Secondary | ICD-10-CM | POA: Diagnosis not present

## 2017-03-24 DIAGNOSIS — Z87891 Personal history of nicotine dependence: Secondary | ICD-10-CM | POA: Insufficient documentation

## 2017-03-24 DIAGNOSIS — Z7982 Long term (current) use of aspirin: Secondary | ICD-10-CM | POA: Insufficient documentation

## 2017-03-24 DIAGNOSIS — E119 Type 2 diabetes mellitus without complications: Secondary | ICD-10-CM | POA: Diagnosis not present

## 2017-03-24 DIAGNOSIS — R339 Retention of urine, unspecified: Secondary | ICD-10-CM | POA: Diagnosis not present

## 2017-03-24 LAB — CBG MONITORING, ED: Glucose-Capillary: 137 mg/dL — ABNORMAL HIGH (ref 65–99)

## 2017-03-24 LAB — URINALYSIS, ROUTINE W REFLEX MICROSCOPIC
BILIRUBIN URINE: NEGATIVE
GLUCOSE, UA: 250 mg/dL — AB
Ketones, ur: NEGATIVE mg/dL
Nitrite: NEGATIVE
PROTEIN: 30 mg/dL — AB
Specific Gravity, Urine: 1.01 (ref 1.005–1.030)
pH: 6.5 (ref 5.0–8.0)

## 2017-03-24 LAB — URINALYSIS, MICROSCOPIC (REFLEX)

## 2017-03-24 LAB — STOOL CULTURE

## 2017-03-24 MED ORDER — LIDOCAINE HCL 2 % EX GEL
1.0000 "application " | Freq: Once | CUTANEOUS | Status: AC
  Administered 2017-03-24: 1 via URETHRAL

## 2017-03-24 MED ORDER — LIDOCAINE HCL 2 % EX GEL
CUTANEOUS | Status: AC
  Filled 2017-03-24: qty 20

## 2017-03-24 NOTE — Discharge Instructions (Signed)
Urine has been sent for culture. We will call you if another antibiotic needs to be added.

## 2017-03-24 NOTE — Telephone Encounter (Signed)
Called patient and wife. ER disposition given. They are going to [facility] ER.

## 2017-03-24 NOTE — ED Triage Notes (Signed)
Urinary retention. He self caths at home. Home care provider states the catheter is stopping up with thick white matter. He also was diagnosed with C diff 4 days ago. He is being treated for it.

## 2017-03-24 NOTE — ED Provider Notes (Signed)
[facility] DEPT [facility] Provider Note   CSN: [id number] Arrival date & time: 03/24/17  1201     History   Chief Complaint Chief Complaint  Patient presents with  . Urinary Retention    HPI Gerald Holt. is a 83 y.o. male.Level V caveat dementia. History is taken from patient's wife who accompanies him. Patient's wife catheterizes for urine 4 times per day. Typically gets out 1200-1400 mL with morning catheterization. This morning she was only able to get out 800 mL. She tried to re-catheterize him and there was white residue on the catheter. Patient has had no fever. No vomiting.. Patient currently under treatment for Clostridium difficile colitis. His wife reports diarrhea has slowed substantially since being treated with antibiotics  HPI  Past Medical History:  Diagnosis Date  . A-fib ([place])   . Cataracts, bilateral   . Chronic dermatitis   . Diabetes type 2, controlled ([place]) 2000  . History of chicken pox   . Mumps    Adult  . Retinopathy   . Shingles   . Stroke [facility]) Nov 2011  . Undescended testicle, unilateral    Right  . Whooping cough     Patient Active Problem List   Diagnosis Date Noted  . Leukocytosis 03/23/2017  . UTI (urinary tract infection) 03/07/2017  . Essential hypertension 03/07/2017  . Nausea & vomiting 02/18/2017  . Weakness generalized 02/18/2017  . Weakness 02/18/2017  . Intermittent self-catheterization of bladder 11/22/2016  . Enlarged prostate 11/22/2016  . Urinary retention 10/31/2016  . Diabetes mellitus with complication ([place])   . Renal insufficiency 10/25/2016  . Sepsis ([place]) 10/24/2016  . Encounter for therapeutic drug monitoring 10/20/2016  . Hx: UTI (urinary tract infection) 10/20/2016  . Hypothyroidism 09/15/2016  . Gastroesophageal reflux disease without esophagitis 07/09/2015  . Falls 06/22/2015  . Diabetes mellitus type II, uncontrolled ([place]) 05/29/2015  . Paroxysmal atrial fibrillation ([place]) 05/29/2015  . HLD  (hyperlipidemia) 05/29/2015  . Hx of completed stroke 05/29/2015    Past Surgical History:  Procedure Laterality Date  . CARDIAC VALVE REPLACEMENT    . PRE-MALIGNANT / BENIGN SKIN LESION EXCISION    . TESTICLE SURGERY     Right, undescended  . TOOTH EXTRACTION         Home Medications    Prior to Admission medications   Medication Sig Start Date End Date Taking? Authorizing Provider  aspirin 81 MG tablet Take 1 tablet (81 mg total) by mouth daily. 06/22/15   Jerline Pain, MD  Black Elderberry,Berry-Flower, 575 MG CAPS Take 1 capsule by mouth 3 (three) times daily with meals.    [provider]  Calcium Carbonate Antacid (ALKA-SELTZER ANTACID PO) Take 2 tablets by mouth daily as needed (for indegestion).     [provider]  Cranberry 1000 MG CAPS Take 2 capsules by mouth daily.    [provider]  cyanocobalamin (,VITAMIN B-12,) 1000 MCG/ML injection Inject 1000 mcg intramuscular daily for 7 days, then once a week for 4 weeks, then once a month for a year Patient taking differently: Inject 1,000 mcg into the muscle every 30 (thirty) days.  04/02/16   Pieter Partridge, DO  docusate sodium (COLACE) 100 MG capsule Take 200 mg by mouth daily as needed for mild constipation.     [provider]  Eyelid Cleansers (STERILID EX) Apply 1 application topically See admin instructions. Eye Wash: Once Daily     [provider]  glucose blood test strip One Touch  Verio strips Use as instructed to check fasting sugars once daily .Dx:E11.9 11/20/16   [name], PA-C  levothyroxine (SYNTHROID, LEVOTHROID) 50 MCG tablet Take 1 tablet (50 mcg total) by mouth daily. 04/09/16   [name], PA-C  lisinopril (PRINIVIL,ZESTRIL) 2.5 MG tablet Take 1 tablet by mouth daily. 12/02/16   [provider]  lovastatin (MEVACOR) 10 MG tablet TAKE 1 TABLET AT BEDTIME 02/27/17   [name], PA-C  metFORMIN (GLUCOPHAGE-XR) 500 MG 24 hr tablet TAKE 2  TABLETS WITH BREAKFAST AND AT BEDTIME 02/27/17   [name], PA-C  metoprolol tartrate (LOPRESSOR) 25 MG tablet Take 0.5 tablets (12.5 mg total) by mouth 2 (two) times daily. 04/09/16   [name], PA-C  Miconazole Nitrate (TRIPLE PASTE AF) 2 % OINT Apply 1 application topically daily as needed (mouth care).     [provider]  Omega-3 Fatty Acids (SB OMEGA-3 FISH OIL) 1000 MG CAPS Take 1,000 mg by mouth daily. Reported on 10/06/2015    [provider]  OVER THE COUNTER MEDICATION Take 1 capsule by mouth 2 (two) times daily. OTC Focus Select    [provider]  pantoprazole (PROTONIX) 40 MG tablet Take 1 tablet (40 mg total) by mouth daily. 03/10/17   Jani Gravel, MD  polyethylene glycol [facility] Floria Raveling) packet Take 17 g by mouth daily as needed for moderate constipation or severe constipation. Patient not taking: Reported on 03/20/2017 03/09/17   Jani Gravel, MD  triamcinolone cream (KENALOG) 0.1 % Apply 1 application topically daily as needed (for itching).  02/01/16   [provider]  vancomycin (VANCOCIN) 125 MG capsule Take 1 capsule (125 mg total) by mouth 4 (four) times daily. 03/21/17   [name], PA-C  Vancomycin  Family History Family History  Problem Relation Age of Onset  . Pneumonia Mother 65       Deceased  . GI Bleed Father 82       Deceased - Ulcers  . Diabetes Cousin   . Diabetes Brother   . Cancer Paternal Aunt     Social History Social History  Substance Use Topics  . Smoking status: Former Research scientist (life sciences)  . Smokeless tobacco: Never Used     Comment: Hasn't smoked since age 27  . Alcohol use No     Allergies   Flomax [tamsulosin hcl]; Shellfish allergy; Aspirin-dipyridamole er; and Other   Review of Systems Review of Systems  Unable to perform ROS: Dementia  Gastrointestinal: Positive for diarrhea.  Genitourinary: Positive for decreased urine volume.     Physical Exam Updated Vital Signs BP (!) 152/91    Pulse (!) 102   Temp 98 F (36.7 C) (Oral)   Resp 18   Ht 5\' 6"  (1.676 m)   Wt 72.6 kg (160 lb)   SpO2 95%   BMI 25.82 kg/m   Physical Exam  Constitutional: He appears well-developed and well-nourished. No distress.  HENT:  Head: Normocephalic and atraumatic.  Eyes: Conjunctivae are normal. Pupils are equal, round, and reactive to light.  Neck: Neck supple. No tracheal deviation present. No thyromegaly present.  Cardiovascular: Normal rate and regular rhythm.   No murmur heard. Pulse counted at 92 bpm by me  Pulmonary/Chest: Effort normal and breath sounds normal.  Abdominal: Soft. Bowel sounds are normal. He exhibits no distension. There is no tenderness.  Genitourinary: Penis normal.  Musculoskeletal: Normal range of motion. He exhibits no edema or tenderness.  Neurological: He is alert. Coordination  normal.  Skin: Skin is warm and dry. No rash noted.  Psychiatric: He has a normal mood and affect.  Nursing note and vitals reviewed.    ED Treatments / Results  Labs (all labs ordered are listed, but only abnormal results are displayed) Labs Reviewed  URINALYSIS, ROUTINE W REFLEX MICROSCOPIC    EKG  EKG Interpretation None      Results for orders placed or performed during the hospital encounter of 03/24/17  Urinalysis, Routine w reflex microscopic  Result Value Ref Range   Color, Urine YELLOW YELLOW   APPearance CLOUDY (A) CLEAR   Specific Gravity, Urine 1.010 1.005 - 1.030   pH 6.5 5.0 - 8.0   Glucose, UA 250 (A) NEGATIVE mg/dL   Hgb urine dipstick LARGE (A) NEGATIVE   Bilirubin Urine NEGATIVE NEGATIVE   Ketones, ur NEGATIVE NEGATIVE mg/dL   Protein, ur 30 (A) NEGATIVE mg/dL   Nitrite NEGATIVE NEGATIVE   Leukocytes, UA LARGE (A) NEGATIVE  Urinalysis, Microscopic (reflex)  Result Value Ref Range   RBC / HPF TOO NUMEROUS TO COUNT 0 - 5 RBC/hpf   WBC, UA TOO NUMEROUS TO COUNT 0 - 5 WBC/hpf   Bacteria, UA FEW (A) NONE SEEN   Squamous Epithelial / LPF 0-5 (A)  NONE SEEN  CBG monitoring, ED  Result Value Ref Range   Glucose-Capillary 137 (H) 65 - 99 mg/dL   Ct Abdomen Pelvis W Contrast  Result Date: 03/08/2017 CLINICAL DATA:  Nausea vomiting and constipation EXAM: CT ABDOMEN AND PELVIS WITH CONTRAST TECHNIQUE: Multidetector CT imaging of the abdomen and pelvis was performed using the standard protocol following bolus administration of intravenous contrast. CONTRAST:  162mL ISOVUE-300 IOPAMIDOL (ISOVUE-300) INJECTION 61% COMPARISON:  03/07/2017 radiograph FINDINGS: Lower chest: Lung bases demonstrate streaky dependent atelectasis. No pleural effusion. Coronary artery calcifications. No pericardial effusion. Mitral annular calcification. Hepatobiliary: Calcified gallstone. No biliary dilatation. No focal hepatic abnormality Pancreas: Unremarkable. No pancreatic ductal dilatation or surrounding inflammatory changes. Spleen: Normal in size without focal abnormality. Adrenals/Urinary Tract: Adrenal glands are unremarkable. Kidneys are normal, without renal calculi, focal lesion, or hydronephrosis. Foley catheter in the bladder with small amount of air in the bladder. Stomach/Bowel: The stomach is nonenlarged. There is no evidence for a bowel obstruction. No significant colon wall thickening. Moderate stool in the colon. Normal appendix. Mild feces impaction in the rectum Vascular/Lymphatic: Aortic atherosclerosis. No enlarged abdominal or pelvic lymph nodes. Reproductive: No masses Other: No free air or free fluid. Musculoskeletal: Degenerative changes of the spine. No acute or suspicious bone lesion. IMPRESSION: 1. Negative for bowel obstruction or bowel wall thickening. Moderate stool in the colon with moderate feces retention in the rectum. 2. Gallstones Electronically Signed   By: Donavan Foil M.D.   On: 03/08/2017 03:08   Dg Abd Acute W/chest  Result Date: 03/07/2017 CLINICAL DATA:  Vomiting and fever. EXAM: DG ABDOMEN ACUTE W/ 1V CHEST COMPARISON:  Chest x-ray  on 02/18/2017 FINDINGS: There is no evidence of pulmonary edema, consolidation, pneumothorax, nodule or pleural fluid. The heart size is normal. Abdominal films show no evidence of acute bowel obstruction or free intraperitoneal air. There are a few air-fluid levels in small bowel which may be consistent with enteritis. Scattered stool throughout the colon without evidence of fecal impaction. Bony structures are unremarkable. The splenic artery is calcified. IMPRESSION: Scattered air-fluid levels in small bowel which may be consistent with enteritis. No evidence of small bowel obstruction or free intraperitoneal air. Electronically Signed   By: Eulas Post  Kathlene Cote M.D.   On: 03/07/2017 16:40   Radiology No results found.  Procedures Procedures (including critical care time)  Medications Ordered in ED Medications - No data to display   Initial Impression / Assessment and Plan / ED Course  I have reviewed the triage vital signs and the nursing notes.  Pertinent labs & imaging results that were available during my care of the patient were reviewed by me and considered in my medical decision making (see chart for details).      Nurse was unable to perform urinary catheterization. I performed urinary catheterization. Timeout performed. The anus was prepped with Betadine. 2% lidocaine jelly was injected into the urethral meatus. A 16 French Foley catheter was inserted without difficulty. Balloon inflated with 10 mL of sterile saline.  Only catheter was pulled out after he drained 600 mL of urine. Urine sent for culture. Patient is on vancomycin presently. We will not add antibiotics presently plan follow-up with PMD and urologist. Injured joint decision with wife it was decided not to leave Foley catheter in Final Clinical Impressions(s) / ED Diagnoses  Diagnosis urinary retention Final diagnoses:  None    New Prescriptions New Prescriptions   No medications on file     Orlie Dakin,  MD 03/24/17 1557

## 2017-03-24 NOTE — ED Notes (Signed)
ED Provider at bedside. 

## 2017-03-25 ENCOUNTER — Encounter: Payer: Self-pay | Admitting: Physician Assistant

## 2017-03-26 LAB — URINE CULTURE

## 2017-03-27 ENCOUNTER — Encounter: Payer: Self-pay | Admitting: Physician Assistant

## 2017-03-27 ENCOUNTER — Telehealth: Payer: Self-pay | Admitting: *Deleted

## 2017-03-27 NOTE — Telephone Encounter (Signed)
Post ED Visit - Positive Culture Follow-up: Chart Hand-off to ED Flow Manager  Culture assessed and recommendations reviewed by: [x]  Levester Fresh, Pharm.D., BCPS []  Heide Guile, Pharm.D., BCPS-AQ ID []  Alycia Rossetti, Pharm.D., BCPS []  [place], Pharm.D., BCPS, AAHIVP []  Legrand Como, Pharm .D., BCPS, AAHIVP []  Milus Glazier, Pharm.D. []  Dimitri Ped, Pharm.D.  Positive urnine culture  []  Patient discharged without antimicrobial prescription and treatment is now indicated []  Organism is resistant to prescribed ED discharge antimicrobial []  Patient with positive blood cultures  Changes discussed with ED provider: Joline Maxcy, PA-C who recommends fax result to [facility] Noble, [place], [facility], [phone], fax#  Ardeen Fillers 03/27/2017, 10:16 AM

## 2017-03-27 NOTE — Progress Notes (Signed)
ED Antimicrobial Stewardship Positive Culture Follow Up   Gerald Holt. is an 83 y.o. male who presented to [facility] on 03/24/2017 with a chief complaint of  Chief Complaint  Patient presents with  . Urinary Retention    Recent Results (from the past 720 hour(s))  Urine culture     Status: Abnormal   Collection Time: 03/07/17  9:30 PM  Result Value Ref Range Status   Specimen Description URINE, CATHETERIZED  Final   Special Requests NONE  Final   Culture (A)  Final    <10,000 COLONIES/mL INSIGNIFICANT GROWTH Performed at [facility], [address] [place], [place] [address]    Report Status 03/09/2017 FINAL  Final  Culture, blood (Routine X 2) w Reflex to ID Panel     Status: None   Collection Time: 03/07/17 10:55 PM  Result Value Ref Range Status   Specimen Description BLOOD RIGHT ARM  Final   Special Requests   Final    BOTTLES DRAWN AEROBIC AND ANAEROBIC Blood Culture adequate volume   Culture NO GROWTH 5 DAYS  Final   Report Status 03/12/2017 FINAL  Final  Culture, blood (Routine X 2) w Reflex to ID Panel     Status: None   Collection Time: 03/07/17 11:00 PM  Result Value Ref Range Status   Specimen Description BLOOD RIGHT WRIST  Final   Special Requests   Final    BOTTLES DRAWN AEROBIC ONLY Blood Culture adequate volume   Culture NO GROWTH 5 DAYS  Final   Report Status 03/12/2017 FINAL  Final  Urine Culture     Status: None   Collection Time: 03/14/17 12:18 PM  Result Value Ref Range Status   Organism ID, Bacteria NO GROWTH  Final  Blood culture (routine x 2)     Status: None   Collection Time: 03/17/17  6:25 PM  Result Value Ref Range Status   Specimen Description BLOOD RIGHT ANTECUBITAL  Final   Special Requests   Final    BOTTLES DRAWN AEROBIC AND ANAEROBIC Blood Culture adequate volume   Culture   Final    NO GROWTH 5 DAYS Performed at [facility], [place] [address], [place], [place] [address]    Report Status 03/22/2017 FINAL  Final   Blood culture (routine x 2)     Status: None   Collection Time: 03/17/17  7:50 PM  Result Value Ref Range Status   Specimen Description BLOOD RIGHT HAND  Final   Special Requests   Final    BOTTLES DRAWN AEROBIC AND ANAEROBIC Blood Culture adequate volume   Culture   Final    NO GROWTH 5 DAYS Performed at [facility], [place] [address] [place], [place] [address]    Report Status 03/22/2017 FINAL  Final  Clostridium Difficile by PCR     Status: Abnormal   Collection Time: 03/20/17  4:35 PM  Result Value Ref Range Status   Toxigenic C Difficile by pcr Detected (AA) Not Detected Final    Comment: This test is for use only with liquid or soft stools; performance characteristics of other clinical specimen types have not been established.   This assay was performed by Cepheid GeneXpert(R) PCR. The performance characteristics of this assay have been determined by [employer]. Performance characteristics refer to the analytical performance of the test.   Stool Culture     Status: None   Collection Time: 03/20/17  4:35 PM  Result Value  Ref Range Status   Organism ID, Bacteria NO SALMONELLA OR SHIGELLA ISOLATED  Final    Comment: NO ENTERIC CAMPYLOBACTER ISOLATED NO ESCHERICHIA COLI 0157 ISOLATED   Ova and parasite examination     Status: None   Collection Time: 03/20/17  4:35 PM  Result Value Ref Range Status   OP No Ova or Parasites Seen   Final  Urine Culture     Status: Abnormal   Collection Time: 03/24/17  1:30 PM  Result Value Ref Range Status   Specimen Description URINE, CATHETERIZED  Final   Special Requests PATIENT ON VANC. NEED SENSITIVITIES IF GPOS  Final   Culture 30,000 COLONIES/mL PSEUDOMONAS AERUGINOSA (A)  Final   Report Status 03/26/2017 FINAL  Final   Organism ID, Bacteria PSEUDOMONAS AERUGINOSA (A)  Final      Susceptibility   Pseudomonas aeruginosa - MIC*    CEFTAZIDIME 4 SENSITIVE Sensitive     CIPROFLOXACIN >=4 RESISTANT Resistant      GENTAMICIN >=16 RESISTANT Resistant     IMIPENEM >=16 RESISTANT Resistant     PIP/TAZO 16 SENSITIVE Sensitive     CEFEPIME 4 SENSITIVE Sensitive     * 30,000 COLONIES/mL PSEUDOMONAS AERUGINOSA   Pt actively treated for cdif: < 100 K colonies Will call results to PCP  ED Provider: Joline Maxcy, PA-C  Wynell Balloon 03/27/2017, 8:40 AM Infectious Diseases Pharmacist Phone# [phone]

## 2017-03-28 NOTE — Telephone Encounter (Signed)
Spoke with Infectious disease specialist yesterday (Dr. Lilia Pro who is part of careteam) who advised against any treatment of culture findings giving patient asymptomatic and treated for c.diff. Patient and family informed.

## 2017-03-30 ENCOUNTER — Encounter: Payer: Self-pay | Admitting: Physician Assistant

## 2017-03-31 ENCOUNTER — Encounter: Payer: Self-pay | Admitting: Neurology

## 2017-03-31 ENCOUNTER — Telehealth: Payer: Self-pay | Admitting: Neurology

## 2017-03-31 ENCOUNTER — Ambulatory Visit (INDEPENDENT_AMBULATORY_CARE_PROVIDER_SITE_OTHER): Payer: Medicare Other | Admitting: Neurology

## 2017-03-31 VITALS — BP 112/70 | HR 74 | Ht 66.0 in | Wt 161.0 lb

## 2017-03-31 DIAGNOSIS — F0150 Vascular dementia without behavioral disturbance: Secondary | ICD-10-CM

## 2017-03-31 DIAGNOSIS — E538 Deficiency of other specified B group vitamins: Secondary | ICD-10-CM | POA: Diagnosis not present

## 2017-03-31 MED ORDER — CYANOCOBALAMIN 1000 MCG/ML IJ SOLN: 1000.0000 ug | Freq: Once | INTRAMUSCULAR | 11 refills | Status: AC

## 2017-03-31 NOTE — Telephone Encounter (Signed)
Returned [employer], they was verifying script is for monthly B12

## 2017-03-31 NOTE — Progress Notes (Signed)
NEUROLOGY FOLLOW UP OFFICE NOTE  Gerald Holt [id number]  HISTORY OF PRESENT ILLNESS: Gerald Holt is a 83 year old man with type 2 diabetes, a-fib, and history of stroke who follows up for vascular dementia and B12 deficiency.  He is accompanied by his wife who supplements history.   UPDATE: He takes ASA 81mg  for secondary stroke prevention. He takes B12 monthly injections.  He has been doing better since starting B12. However, he has been hospitalized several times this year for UTI, sepsis, shingles and C. Diff colits.  He used to play puzzles 4 hours a day and now can only tolerate an hour a week.  Due to memory, he can no longer play piano.  Hgb A1c from 03/05/17 was 7.2.     HISTORY: Since February 2017, he has had a progressive decline in cognition.  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  He plays piano daily.  He started having trouble remembering which pieces he played and sometimes how to play them.  He also has left the stove on and water running.  He started having trouble getting dress and has put on his shirt incorrectly.  Marland Kitchen  He has had several falls.  He had to stop driving.  He uses a cane but it was recommended to use a walker.  He is hard of hearing.  He had a TIA on 05/30/16 for left arm weakness and numbness, slurred speech and feeling foggy in the head.  CT of head was personally reviewed and revealed a left parietal meningioma but no acute abnormalities.  He later had an MRI of the brain on 06/08/16, which was personally reviewed and revealed moderate atrophy and chronic small vessel disease and a left parietal meningioma, stable but no acute/subacute stroke or bleed.    He started a memory program based out of [place].  Genetic testing reportedly showed he was negative for APOE.  He was advised to start supplements and to stop statin therapy (or at least have LDL be around 70).   There is no family history of dementia, but his family passed  away at young age.  PAST MEDICAL HISTORY: Past Medical History:  Diagnosis Date  . A-fib ([place])   . Cataracts, bilateral   . Chronic dermatitis   . Diabetes type 2, controlled ([place]) 2000  . History of chicken pox   . Mumps    Adult  . Retinopathy   . Shingles   . Stroke [facility]) Nov 2011  . Undescended testicle, unilateral    Right  . Whooping cough     MEDICATIONS: Current Outpatient Prescriptions on File Prior to Visit  Medication Sig Dispense Refill  . aspirin 81 MG tablet Take 1 tablet (81 mg total) by mouth daily.    . Black Elderberry,Berry-Flower, 575 MG CAPS Take 1 capsule by mouth 3 (three) times daily with meals.    . Calcium Carbonate Antacid (ALKA-SELTZER ANTACID PO) Take 2 tablets by mouth daily as needed (for indegestion).     . Cranberry 1000 MG CAPS Take 2 capsules by mouth daily.    . cyanocobalamin (,VITAMIN B-12,) 1000 MCG/ML injection Inject 1000 mcg intramuscular daily for 7 days, then once a week for 4 weeks, then once a month for a year (Patient taking differently: Inject 1,000 mcg into the muscle every 30 (thirty) days. ) 25 mL 0  . docusate sodium (COLACE) 100 MG capsule Take 200 mg by mouth daily as needed for mild constipation.     Marland Kitchen  Eyelid Cleansers (STERILID EX) Apply 1 application topically See admin instructions. Eye Wash: Once Daily     . glucose blood test strip One Touch Verio strips Use as instructed to check fasting sugars once daily .Dx:E11.9 100 each 12  . levothyroxine (SYNTHROID, LEVOTHROID) 50 MCG tablet Take 1 tablet (50 mcg total) by mouth daily. 90 tablet 3  . metFORMIN (GLUCOPHAGE-XR) 500 MG 24 hr tablet TAKE 2 TABLETS WITH BREAKFAST AND AT BEDTIME 360 tablet 1  . metoprolol tartrate (LOPRESSOR) 25 MG tablet Take 0.5 tablets (12.5 mg total) by mouth 2 (two) times daily. 180 tablet 1  . Omega-3 Fatty Acids (SB OMEGA-3 FISH OIL) 1000 MG CAPS Take 1,000 mg by mouth daily. Reported on 10/06/2015    . OVER THE COUNTER MEDICATION Take 1 capsule by  mouth 2 (two) times daily. OTC Focus Select    . triamcinolone cream (KENALOG) 0.1 % Apply 1 application topically daily as needed (for itching).     . vancomycin (VANCOCIN) 125 MG capsule Take 1 capsule (125 mg total) by mouth 4 (four) times daily. 40 capsule 0  . lovastatin (MEVACOR) 10 MG tablet TAKE 1 TABLET AT BEDTIME (Patient not taking: Reported on 03/31/2017) 90 tablet 1  . Miconazole Nitrate (TRIPLE PASTE AF) 2 % OINT Apply 1 application topically daily as needed (mouth care).      No current facility-administered medications on file prior to visit.     ALLERGIES: Allergies  Allergen Reactions  . Flomax [Tamsulosin Hcl] Other (See Comments)    Hypotension  . Shellfish Allergy Anaphylaxis  . Aspirin-Dipyridamole Er Other (See Comments)    Headaches   . Other Other (See Comments)    Blood Thinner given in Rehab gave H/As - Not Coumadin/Headaches      FAMILY HISTORY: Family History  Problem Relation Age of Onset  . Pneumonia Mother 66       Deceased  . GI Bleed Father 32       Deceased - Ulcers  . Diabetes Cousin   . Diabetes Brother   . Cancer Paternal Aunt     SOCIAL HISTORY: Social History   Social History  . Marital status: Married    Spouse name: N/A  . Number of children: N/A  . Years of education: N/A   Occupational History  . Not on file.   Social History Main Topics  . Smoking status: Former Research scientist (life sciences)  . Smokeless tobacco: Never Used     Comment: Hasn't smoked since age 18  . Alcohol use No  . Drug use: No  . Sexual activity: Not on file   Other Topics Concern  . Not on file   Social History Narrative   Lives with wife in a one story home.  Has 1 child.  Retired Armed forces technical officer.  Education: Oceanographer.      REVIEW OF SYSTEMS: Constitutional: No fevers, chills, or sweats, no generalized fatigue, change in appetite Eyes: No visual changes, double vision, eye pain Ear, nose and throat: No hearing loss, ear pain, nasal congestion, sore  throat Cardiovascular: No chest pain, palpitations Respiratory:  No shortness of breath at rest or with exertion, wheezes GastrointestinaI: No nausea, vomiting, diarrhea, abdominal pain, fecal incontinence Genitourinary:  No dysuria, urinary retention or frequency Musculoskeletal:  No neck pain, back pain Integumentary: No rash, pruritus, skin lesions Neurological: as above Psychiatric: No depression, insomnia, anxiety Endocrine: No palpitations, fatigue, diaphoresis, mood swings, change in appetite, change in weight, increased thirst Hematologic/Lymphatic:  No purpura, petechiae. Allergic/Immunologic:  no itchy/runny eyes, nasal congestion, recent allergic reactions, rashes  PHYSICAL EXAM: Vitals:   03/31/17 1056  BP: 112/70  Pulse: 74   General: No acute distress.  Patient appears well-groomed.  normal body habitus. Head:  Normocephalic/atraumatic Eyes:  Fundi examined but not visualized Neck: supple, no paraspinal tenderness, full range of motion Heart:  Regular rate and rhythm Lungs:  Clear to auscultation bilaterally Back: No paraspinal tenderness Neurological Exam: alert and oriented to person, place, and time. Attention span and concentration intact, recalled 2 of 3 words after 5 minutes and remote memory intact, fund of knowledge intact.  Speech fluent and not dysarthric, language intact.  CN II-XII intact. Bulk and tone normal, muscle strength 5/5 throughout.  Sensation to light touch intact.  Deep tendon reflexes absent throughout..  Finger to nose testing intact.  Gait normal.  IMPRESSION: Vascular dementia B12 deficiency  PLAN: 1.  Continue monthly B12 shots, refilled today.  Check B12 level. 2.  Continue ASA 81mg  daily 3.  Optimize glycemic control. 4.  Follow up in 6 months and repeat MMSE.  22 minutes spent face to face with patient, over 50% spent discussing management.  We did discuss possibility of starting Aricept, but he and his wife would like to hold off  for now.  Metta Clines, DO  CC:  Leeanne Rio, PA-C

## 2017-03-31 NOTE — Telephone Encounter (Signed)
Gerald Holt called in regards to a prescription for B12 and has a question CB# [phone]

## 2017-03-31 NOTE — Patient Instructions (Signed)
1.  Continue aspirin 81mg  daily 2.  Check B12 level.  Shots refilled. 3.  Follow up in 6 months.

## 2017-04-02 ENCOUNTER — Ambulatory Visit (INDEPENDENT_AMBULATORY_CARE_PROVIDER_SITE_OTHER): Payer: Medicare Other | Admitting: Physician Assistant

## 2017-04-02 ENCOUNTER — Encounter: Payer: Self-pay | Admitting: Physician Assistant

## 2017-04-02 VITALS — BP 112/62 | HR 66 | Temp 97.5°F | Resp 14 | Ht 66.0 in | Wt 162.0 lb

## 2017-04-02 DIAGNOSIS — D72829 Elevated white blood cell count, unspecified: Secondary | ICD-10-CM

## 2017-04-02 DIAGNOSIS — A0472 Enterocolitis due to Clostridium difficile, not specified as recurrent: Secondary | ICD-10-CM | POA: Diagnosis not present

## 2017-04-02 LAB — COMPREHENSIVE METABOLIC PANEL
ALBUMIN: 3.9 g/dL (ref 3.5–5.2)
ALK PHOS: 52 U/L (ref 39–117)
ALT: 13 U/L (ref 0–53)
AST: 14 U/L (ref 0–37)
BUN: 13 mg/dL (ref 6–23)
CHLORIDE: 98 meq/L (ref 96–112)
CO2: 27 mEq/L (ref 19–32)
CREATININE: 1.14 mg/dL (ref 0.40–1.50)
Calcium: 10.1 mg/dL (ref 8.4–10.5)
GFR: 63.94 mL/min (ref >60.00)
Glucose, Bld: 119 mg/dL — ABNORMAL HIGH (ref 70–99)
Potassium: 5.6 mEq/L — ABNORMAL HIGH (ref 3.5–5.1)
Sodium: 136 mEq/L (ref 135–145)
TOTAL PROTEIN: 6.9 g/dL (ref 6.0–8.3)
Total Bilirubin: 0.4 mg/dL (ref 0.2–1.2)

## 2017-04-02 LAB — CBC WITH DIFFERENTIAL/PLATELET
BASOS PCT: 0.6 % (ref 0.0–3.0)
Basophils Absolute: 0.1 10*3/uL (ref 0.0–0.1)
EOS ABS: 9.8 10*3/uL — AB (ref 0.0–0.7)
EOS PCT: 48.5 % — AB (ref 0.0–5.0)
HEMATOCRIT: 37.3 % — AB (ref 39.0–52.0)
HEMOGLOBIN: 12.5 g/dL — AB (ref 13.0–17.0)
LYMPHS PCT: 18.2 % (ref 12.0–46.0)
Lymphs Abs: 3.7 10*3/uL (ref 0.7–4.0)
MCHC: 33.7 g/dL (ref 30.0–36.0)
MCV: 87.2 fl (ref 78.0–100.0)
Monocytes Absolute: 1.1 10*3/uL — ABNORMAL HIGH (ref 0.1–1.0)
Monocytes Relative: 5.4 % (ref 3.0–12.0)
Neutro Abs: 5.5 10*3/uL (ref 1.4–7.7)
Neutrophils Relative %: 27.3 % — ABNORMAL LOW (ref 43.0–77.0)
Platelets: 226 10*3/uL (ref 150.0–400.0)
RBC: 4.27 Mil/uL (ref 4.22–5.81)
RDW: 15.4 % (ref 11.5–15.5)

## 2017-04-02 NOTE — Progress Notes (Signed)
Pre visit review using our clinic review tool, if applicable. No additional management support is needed unless otherwise documented below in the visit note. 

## 2017-04-02 NOTE — Progress Notes (Signed)
Patient presents to clinic today for follow-up of  c diff associated diarrhea and leukocytosis. Patient is currently on a regimen of vancomycin 125 mg QID for a total of 10 day course. Has been taking medications as directed according to caregivers. Is tolerating well. State they are still noting fatigue but it is hard to determine if this is related to infection, medication or being 83 years old. Denies any new or worsening symptoms. Family note he has been more perky and active -- doing puzzles and playing the piano. Appetite has improved. Still having gaseousness. Stools are still loose in consistency but not frequent and improving daily. Patient has follow-up scheduled with ID later this week.  Past Medical History:  Diagnosis Date  . A-fib ([place])   . Cataracts, bilateral   . Chronic dermatitis   . Diabetes type 2, controlled ([place]) 2000  . History of chicken pox   . Mumps    Adult  . Retinopathy   . Shingles   . Stroke [facility]) Nov 2011  . Undescended testicle, unilateral    Right  . Whooping cough     Current Outpatient Prescriptions on File Prior to Visit  Medication Sig Dispense Refill  . aspirin 81 MG tablet Take 1 tablet (81 mg total) by mouth daily.    . Black Elderberry,Berry-Flower, 575 MG CAPS Take 1 capsule by mouth 3 (three) times daily with meals.    . Calcium Carbonate Antacid (ALKA-SELTZER ANTACID PO) Take 2 tablets by mouth daily as needed (for indegestion).     . Cranberry 1000 MG CAPS Take 2 capsules by mouth daily.    . Digestive Enzyme CAPS Take by mouth. 1 daily with every meal    . docusate sodium (COLACE) 100 MG capsule Take 200 mg by mouth daily as needed for mild constipation.     . Eyelid Cleansers (STERILID EX) Apply 1 application topically See admin instructions. Eye Wash: Once Daily     . glucose blood test strip One Touch Verio strips Use as instructed to check fasting sugars once daily .Dx:E11.9 100 each 12  . levothyroxine (SYNTHROID, LEVOTHROID) 50 MCG  tablet Take 1 tablet (50 mcg total) by mouth daily. 90 tablet 3  . lovastatin (MEVACOR) 10 MG tablet TAKE 1 TABLET AT BEDTIME 90 tablet 1  . metFORMIN (GLUCOPHAGE-XR) 500 MG 24 hr tablet TAKE 2 TABLETS WITH BREAKFAST AND AT BEDTIME 360 tablet 1  . metoprolol tartrate (LOPRESSOR) 25 MG tablet Take 0.5 tablets (12.5 mg total) by mouth 2 (two) times daily. 180 tablet 1  . Miconazole Nitrate (TRIPLE PASTE AF) 2 % OINT Apply 1 application topically daily as needed (mouth care).     . Omega-3 Fatty Acids (SB OMEGA-3 FISH OIL) 1000 MG CAPS Take 1,000 mg by mouth daily. Reported on 10/06/2015    . OVER THE COUNTER MEDICATION Take 1 capsule by mouth 2 (two) times daily. OTC Focus Select    . triamcinolone cream (KENALOG) 0.1 % Apply 1 application topically daily as needed (for itching).      No current facility-administered medications on file prior to visit.     Allergies  Allergen Reactions  . Flomax [Tamsulosin Hcl] Other (See Comments)    Hypotension  . Shellfish Allergy Anaphylaxis  . Aspirin-Dipyridamole Er Other (See Comments)    Headaches   . Other Other (See Comments)    Blood Thinner given in Rehab gave H/As - Not Coumadin/Headaches      Family History  Problem Relation Age  of Onset  . Pneumonia Mother 77       Deceased  . GI Bleed Father 42       Deceased - Ulcers  . Diabetes Cousin   . Diabetes Brother   . Cancer Paternal Aunt     Social History   Social History  . Marital status: Married    Spouse name: N/A  . Number of children: N/A  . Years of education: N/A   Social History Main Topics  . Smoking status: Former Research scientist (life sciences)  . Smokeless tobacco: Never Used     Comment: Hasn't smoked since age 24  . Alcohol use No  . Drug use: No  . Sexual activity: Not Asked   Other Topics Concern  . None   Social History Narrative   Lives with wife in a one story home.  Has 1 child.  Retired Armed forces technical officer.  Education: Oceanographer.      Review of Systems - See HPI.  All other  ROS are negative.  BP 112/62   Pulse 66   Temp (!) 97.5 F (36.4 C) (Oral)   Resp 14   Ht '5\' 6"'  (1.676 m)   Wt 162 lb (73.5 kg)   SpO2 97%   BMI 26.15 kg/m   Physical Exam  Constitutional: He is oriented to person, place, and time and well-developed, well-nourished, and in no distress.  HENT:  Head: Normocephalic and atraumatic.  Eyes: Conjunctivae are normal.  Neck: Neck supple.  Cardiovascular: Normal heart sounds and intact distal pulses.   Chronic irregularly irregular rhythm.  Pulmonary/Chest: Effort normal and breath sounds normal. No respiratory distress. He has no wheezes. He has no rales. He exhibits no tenderness.  Abdominal: Soft. Bowel sounds are normal. He exhibits no distension and no mass. There is no tenderness. There is no rebound and no guarding.  Neurological: He is alert and oriented to person, place, and time.  Skin: Skin is warm and dry. No rash noted.  Psychiatric: Affect normal.  Vitals reviewed.   Recent Results (from the past 2160 hour(s))  POCT Urinalysis Dipstick     Status: Abnormal   Collection Time: 01/22/17 11:47 AM  Result Value Ref Range   Color, UA yellow    Clarity, UA cloudy    Glucose, UA negative    Bilirubin, UA negative    Ketones, UA negative    Spec Grav, UA 1.010 1.010 - 1.025   Blood, UA +-    pH, UA 6.5 5.0 - 8.0   Protein, UA negative    Urobilinogen, UA 0.2 0.2 or 1.0 E.U./dL   Nitrite, UA negative    Leukocytes, UA Large (3+) (A) Negative  Urine culture     Status: None   Collection Time: 01/22/17  1:49 PM  Result Value Ref Range   Organism ID, Bacteria NO GROWTH   Comprehensive metabolic panel     Status: Abnormal   Collection Time: 02/17/17 11:43 PM  Result Value Ref Range   Sodium 133 (L) 135 - 145 mmol/L   Potassium 4.6 3.5 - 5.1 mmol/L   Chloride 97 (L) 101 - 111 mmol/L   CO2 22 22 - 32 mmol/L   Glucose, Bld 196 (H) 65 - 99 mg/dL   BUN 19 6 - 20 mg/dL   Creatinine, Ser 1.03 0.61 - 1.24 mg/dL   Calcium 9.5  8.9 - 10.3 mg/dL   Total Protein 6.6 6.5 - 8.1 g/dL   Albumin 3.5 3.5 - 5.0 g/dL   AST  23 15 - 41 U/L   ALT 17 17 - 63 U/L   Alkaline Phosphatase 45 38 - 126 U/L   Total Bilirubin 0.7 0.3 - 1.2 mg/dL   GFR calc non Af Amer >60 >60 mL/min   GFR calc Af Amer >60 >60 mL/min    Comment: (NOTE) The eGFR has been calculated using the CKD EPI equation. This calculation has not been validated in all clinical situations. eGFR's persistently <60 mL/min signify possible Chronic Kidney Disease.    Anion gap 14 5 - 15  Lipase, blood     Status: None   Collection Time: 02/17/17 11:43 PM  Result Value Ref Range   Lipase 30 11 - 51 U/L  Troponin I     Status: None   Collection Time: 02/17/17 11:43 PM  Result Value Ref Range   Troponin I <0.03 <0.03 ng/mL  CBC with Differential     Status: Abnormal   Collection Time: 02/17/17 11:43 PM  Result Value Ref Range   WBC 19.8 (H) 4.0 - 10.5 K/uL   RBC 4.47 4.22 - 5.81 MIL/uL   Hemoglobin 12.6 (L) 13.0 - 17.0 g/dL   HCT 37.5 (L) 39.0 - 52.0 %   MCV 83.9 78.0 - 100.0 fL   MCH 28.2 26.0 - 34.0 pg   MCHC 33.6 30.0 - 36.0 g/dL   RDW 14.2 11.5 - 15.5 %   Platelets 227 150 - 400 K/uL   Neutrophils Relative % 84 %   Neutro Abs 16.6 (H) 1.7 - 7.7 K/uL   Lymphocytes Relative 5 %   Lymphs Abs 1.1 0.7 - 4.0 K/uL   Monocytes Relative 7 %   Monocytes Absolute 1.4 (H) 0.1 - 1.0 K/uL   Eosinophils Relative 4 %   Eosinophils Absolute 0.7 0.0 - 0.7 K/uL   Basophils Relative 0 %   Basophils Absolute 0.0 0.0 - 0.1 K/uL  Urinalysis, Routine w reflex microscopic     Status: Abnormal   Collection Time: 02/18/17 12:00 AM  Result Value Ref Range   Color, Urine YELLOW YELLOW   APPearance CLEAR CLEAR   Specific Gravity, Urine 1.010 1.005 - 1.030   pH 6.0 5.0 - 8.0   Glucose, UA NEGATIVE NEGATIVE mg/dL   Hgb urine dipstick NEGATIVE NEGATIVE   Bilirubin Urine NEGATIVE NEGATIVE   Ketones, ur 5 (A) NEGATIVE mg/dL   Protein, ur NEGATIVE NEGATIVE mg/dL   Nitrite  NEGATIVE NEGATIVE   Leukocytes, UA MODERATE (A) NEGATIVE   RBC / HPF 0-5 0 - 5 RBC/hpf   WBC, UA TOO NUMEROUS TO COUNT 0 - 5 WBC/hpf   Bacteria, UA NONE SEEN NONE SEEN   Squamous Epithelial / LPF NONE SEEN NONE SEEN  Culture, Urine     Status: Abnormal   Collection Time: 02/18/17 12:00 AM  Result Value Ref Range   Specimen Description URINE, RANDOM    Special Requests NONE    Culture 60,000 COLONIES/mL ENTEROCOCCUS FAECALIS (A)    Report Status 02/20/2017 FINAL    Organism ID, Bacteria ENTEROCOCCUS FAECALIS (A)       Susceptibility   Enterococcus faecalis - MIC*    AMPICILLIN <=2 SENSITIVE Sensitive     LEVOFLOXACIN 1 SENSITIVE Sensitive     NITROFURANTOIN <=16 SENSITIVE Sensitive     VANCOMYCIN 1 SENSITIVE Sensitive     * 60,000 COLONIES/mL ENTEROCOCCUS FAECALIS  Glucose, capillary     Status: Abnormal   Collection Time: 02/18/17  4:18 AM  Result Value Ref Range   Glucose-Capillary  186 (H) 65 - 99 mg/dL  CK     Status: Abnormal   Collection Time: 02/18/17  4:47 AM  Result Value Ref Range   Total CK 11 (L) 49 - 397 U/L  TSH     Status: None   Collection Time: 02/18/17  4:47 AM  Result Value Ref Range   TSH 1.197 0.350 - 4.500 uIU/mL    Comment: Performed by a 3rd Generation assay with a functional sensitivity of <=0.01 uIU/mL.  Troponin I     Status: None   Collection Time: 02/18/17  4:47 AM  Result Value Ref Range   Troponin I <0.03 <0.03 ng/mL  CBC     Status: Abnormal   Collection Time: 02/18/17  4:47 AM  Result Value Ref Range   WBC 16.9 (H) 4.0 - 10.5 K/uL   RBC 4.13 (L) 4.22 - 5.81 MIL/uL   Hemoglobin 11.7 (L) 13.0 - 17.0 g/dL   HCT 34.9 (L) 39.0 - 52.0 %   MCV 84.5 78.0 - 100.0 fL   MCH 28.3 26.0 - 34.0 pg   MCHC 33.5 30.0 - 36.0 g/dL   RDW 14.2 11.5 - 15.5 %   Platelets 212 150 - 400 K/uL  Creatinine, serum     Status: None   Collection Time: 02/18/17  4:47 AM  Result Value Ref Range   Creatinine, Ser 1.04 0.61 - 1.24 mg/dL   GFR calc non Af Amer >60 >60  mL/min   GFR calc Af Amer >60 >60 mL/min    Comment: (NOTE) The eGFR has been calculated using the CKD EPI equation. This calculation has not been validated in all clinical situations. eGFR's persistently <60 mL/min signify possible Chronic Kidney Disease.   Glucose, capillary     Status: Abnormal   Collection Time: 02/18/17  8:02 AM  Result Value Ref Range   Glucose-Capillary 217 (H) 65 - 99 mg/dL  Glucose, capillary     Status: Abnormal   Collection Time: 02/18/17 11:41 AM  Result Value Ref Range   Glucose-Capillary 226 (H) 65 - 99 mg/dL  Glucose, capillary     Status: Abnormal   Collection Time: 02/18/17  5:19 PM  Result Value Ref Range   Glucose-Capillary 158 (H) 65 - 99 mg/dL  Glucose, capillary     Status: Abnormal   Collection Time: 02/18/17  8:59 PM  Result Value Ref Range   Glucose-Capillary 198 (H) 65 - 99 mg/dL  Glucose, capillary     Status: Abnormal   Collection Time: 02/19/17  7:47 AM  Result Value Ref Range   Glucose-Capillary 171 (H) 65 - 99 mg/dL  Glucose, capillary     Status: Abnormal   Collection Time: 02/19/17 12:36 PM  Result Value Ref Range   Glucose-Capillary 187 (H) 65 - 99 mg/dL  Glucose, capillary     Status: Abnormal   Collection Time: 02/19/17  5:25 PM  Result Value Ref Range   Glucose-Capillary 151 (H) 65 - 99 mg/dL  Glucose, capillary     Status: Abnormal   Collection Time: 02/19/17  8:09 PM  Result Value Ref Range   Glucose-Capillary 157 (H) 65 - 99 mg/dL  Glucose, capillary     Status: Abnormal   Collection Time: 02/19/17 10:31 PM  Result Value Ref Range   Glucose-Capillary 164 (H) 65 - 99 mg/dL  Glucose, capillary     Status: Abnormal   Collection Time: 02/20/17  5:26 AM  Result Value Ref Range   Glucose-Capillary 175 (H) 65 -  99 mg/dL   Comment 1 Notify RN    Comment 2 Document in Chart   CBC     Status: Abnormal   Collection Time: 02/20/17  5:37 AM  Result Value Ref Range   WBC 9.0 4.0 - 10.5 K/uL   RBC 4.02 (L) 4.22 - 5.81  MIL/uL   Hemoglobin 11.2 (L) 13.0 - 17.0 g/dL   HCT 33.5 (L) 39.0 - 52.0 %   MCV 83.3 78.0 - 100.0 fL   MCH 27.9 26.0 - 34.0 pg   MCHC 33.4 30.0 - 36.0 g/dL   RDW 14.2 11.5 - 15.5 %   Platelets 195 150 - 400 K/uL  Basic metabolic panel     Status: Abnormal   Collection Time: 02/20/17  5:37 AM  Result Value Ref Range   Sodium 136 135 - 145 mmol/L   Potassium 4.0 3.5 - 5.1 mmol/L   Chloride 102 101 - 111 mmol/L   CO2 25 22 - 32 mmol/L   Glucose, Bld 181 (H) 65 - 99 mg/dL   BUN 16 6 - 20 mg/dL   Creatinine, Ser 0.99 0.61 - 1.24 mg/dL   Calcium 9.2 8.9 - 10.3 mg/dL   GFR calc non Af Amer >60 >60 mL/min   GFR calc Af Amer >60 >60 mL/min    Comment: (NOTE) The eGFR has been calculated using the CKD EPI equation. This calculation has not been validated in all clinical situations. eGFR's persistently <60 mL/min signify possible Chronic Kidney Disease.    Anion gap 9 5 - 15  Glucose, capillary     Status: Abnormal   Collection Time: 02/20/17  7:55 AM  Result Value Ref Range   Glucose-Capillary 182 (H) 65 - 99 mg/dL  Glucose, capillary     Status: Abnormal   Collection Time: 02/20/17 12:04 PM  Result Value Ref Range   Glucose-Capillary 273 (H) 65 - 99 mg/dL  Hemoglobin A1c     Status: Abnormal   Collection Time: 03/05/17 12:04 PM  Result Value Ref Range   Hgb A1c MFr Bld 7.2 (H) 4.6 - 6.5 %    Comment: Glycemic Control Guidelines for People with Diabetes:Non Diabetic:  <6%Goal of Therapy: <7%Additional Action Suggested:  >8%   Comprehensive metabolic panel     Status: Abnormal   Collection Time: 03/07/17  3:40 PM  Result Value Ref Range   Sodium 133 (L) 135 - 145 mmol/L   Potassium 4.5 3.5 - 5.1 mmol/L   Chloride 96 (L) 101 - 111 mmol/L   CO2 22 22 - 32 mmol/L   Glucose, Bld 132 (H) 65 - 99 mg/dL   BUN 20 6 - 20 mg/dL   Creatinine, Ser 1.00 0.61 - 1.24 mg/dL   Calcium 9.6 8.9 - 10.3 mg/dL   Total Protein 7.1 6.5 - 8.1 g/dL   Albumin 3.7 3.5 - 5.0 g/dL   AST 31 15 - 41 U/L     ALT 18 17 - 63 U/L   Alkaline Phosphatase 43 38 - 126 U/L   Total Bilirubin 1.0 0.3 - 1.2 mg/dL   GFR calc non Af Amer >60 >60 mL/min   GFR calc Af Amer >60 >60 mL/min    Comment: (NOTE) The eGFR has been calculated using the CKD EPI equation. This calculation has not been validated in all clinical situations. eGFR's persistently <60 mL/min signify possible Chronic Kidney Disease.    Anion gap 15 5 - 15  CBC with Differential     Status: Abnormal  Collection Time: 03/07/17  3:40 PM  Result Value Ref Range   WBC 19.3 (H) 4.0 - 10.5 K/uL   RBC 4.32 4.22 - 5.81 MIL/uL   Hemoglobin 12.5 (L) 13.0 - 17.0 g/dL   HCT 35.8 (L) 39.0 - 52.0 %   MCV 82.9 78.0 - 100.0 fL   MCH 28.9 26.0 - 34.0 pg   MCHC 34.9 30.0 - 36.0 g/dL   RDW 14.1 11.5 - 15.5 %   Platelets 225 150 - 400 K/uL   Neutrophils Relative % 84 %   Neutro Abs 16.2 (H) 1.7 - 7.7 K/uL   Lymphocytes Relative 8 %   Lymphs Abs 1.5 0.7 - 4.0 K/uL   Monocytes Relative 4 %   Monocytes Absolute 0.7 0.1 - 1.0 K/uL   Eosinophils Relative 4 %   Eosinophils Absolute 0.7 0.0 - 0.7 K/uL   Basophils Relative 0 %   Basophils Absolute 0.1 0.0 - 0.1 K/uL  Troponin I     Status: None   Collection Time: 03/07/17  3:40 PM  Result Value Ref Range   Troponin I <0.03 <0.03 ng/mL  Urinalysis, Routine w reflex microscopic     Status: Abnormal   Collection Time: 03/07/17  3:55 PM  Result Value Ref Range   Color, Urine YELLOW YELLOW   APPearance CLEAR CLEAR   Specific Gravity, Urine 1.013 1.005 - 1.030   pH 6.5 5.0 - 8.0   Glucose, UA NEGATIVE NEGATIVE mg/dL   Hgb urine dipstick NEGATIVE NEGATIVE   Bilirubin Urine NEGATIVE NEGATIVE   Ketones, ur 15 (A) NEGATIVE mg/dL   Protein, ur NEGATIVE NEGATIVE mg/dL   Nitrite NEGATIVE NEGATIVE   Leukocytes, UA TRACE (A) NEGATIVE  Urinalysis, Microscopic (reflex)     Status: Abnormal   Collection Time: 03/07/17  3:55 PM  Result Value Ref Range   RBC / HPF 0-5 0 - 5 RBC/hpf   WBC, UA 6-30 0 - 5  WBC/hpf   Bacteria, UA MANY (A) NONE SEEN   Squamous Epithelial / LPF 0-5 (A) NONE SEEN   Mucous PRESENT   Urine culture     Status: Abnormal   Collection Time: 03/07/17  9:30 PM  Result Value Ref Range   Specimen Description URINE, CATHETERIZED    Special Requests NONE    Culture (A)     <10,000 COLONIES/mL INSIGNIFICANT GROWTH Performed at [facility], [address] [place], [place] [address]    Report Status 03/09/2017 FINAL   Culture, blood (Routine X 2) w Reflex to ID Panel     Status: None   Collection Time: 03/07/17 10:55 PM  Result Value Ref Range   Specimen Description BLOOD RIGHT ARM    Special Requests      BOTTLES DRAWN AEROBIC AND ANAEROBIC Blood Culture adequate volume   Culture NO GROWTH 5 DAYS    Report Status 03/12/2017 FINAL   Lipase, blood     Status: None   Collection Time: 03/07/17 10:56 PM  Result Value Ref Range   Lipase 25 11 - 51 U/L  Lactic acid, plasma     Status: Abnormal   Collection Time: 03/07/17 10:56 PM  Result Value Ref Range   Lactic Acid, Venous 3.8 (HH) 0.5 - 1.9 mmol/L    Comment: CRITICAL RESULT CALLED TO, READ BACK BY AND VERIFIED WITH: WHITEHORN,S RN 03/08/2017 0131 JORDANS   Culture, blood (Routine X 2) w Reflex to ID Panel     Status: None   Collection Time: 03/07/17 11:00  PM  Result Value Ref Range   Specimen Description BLOOD RIGHT WRIST    Special Requests      BOTTLES DRAWN AEROBIC ONLY Blood Culture adequate volume   Culture NO GROWTH 5 DAYS    Report Status 03/12/2017 FINAL   Basic metabolic panel     Status: Abnormal   Collection Time: 03/08/17  3:00 AM  Result Value Ref Range   Sodium 130 (L) 135 - 145 mmol/L   Potassium 3.7 3.5 - 5.1 mmol/L   Chloride 98 (L) 101 - 111 mmol/L   CO2 26 22 - 32 mmol/L   Glucose, Bld 138 (H) 65 - 99 mg/dL   BUN 15 6 - 20 mg/dL   Creatinine, Ser 0.95 0.61 - 1.24 mg/dL   Calcium 8.5 (L) 8.9 - 10.3 mg/dL   GFR calc non Af Amer >60 >60 mL/min   GFR calc Af Amer >60 >60 mL/min     Comment: (NOTE) The eGFR has been calculated using the CKD EPI equation. This calculation has not been validated in all clinical situations. eGFR's persistently <60 mL/min signify possible Chronic Kidney Disease.    Anion gap 6 5 - 15  CBC     Status: Abnormal   Collection Time: 03/08/17  3:00 AM  Result Value Ref Range   WBC 12.2 (H) 4.0 - 10.5 K/uL   RBC 3.78 (L) 4.22 - 5.81 MIL/uL   Hemoglobin 10.6 (L) 13.0 - 17.0 g/dL   HCT 31.8 (L) 39.0 - 52.0 %   MCV 84.1 78.0 - 100.0 fL   MCH 28.0 26.0 - 34.0 pg   MCHC 33.3 30.0 - 36.0 g/dL   RDW 14.1 11.5 - 15.5 %   Platelets 191 150 - 400 K/uL  Protime-INR     Status: None   Collection Time: 03/08/17  3:00 AM  Result Value Ref Range   Prothrombin Time 14.3 11.4 - 15.2 seconds   INR 1.11   Lactic acid, plasma     Status: None   Collection Time: 03/08/17  3:00 AM  Result Value Ref Range   Lactic Acid, Venous 1.9 0.5 - 1.9 mmol/L  Procalcitonin     Status: None   Collection Time: 03/08/17  3:00 AM  Result Value Ref Range   Procalcitonin 0.10 ng/mL    Comment:        Interpretation: PCT (Procalcitonin) <= 0.5 ng/mL: Systemic infection (sepsis) is not likely. Local bacterial infection is possible. (NOTE)         ICU PCT Algorithm               Non ICU PCT Algorithm    ----------------------------     ------------------------------         PCT < 0.25 ng/mL                 PCT < 0.1 ng/mL     Stopping of antibiotics            Stopping of antibiotics       strongly encouraged.               strongly encouraged.    ----------------------------     ------------------------------       PCT level decrease by               PCT < 0.25 ng/mL       >= 80% from peak PCT       OR PCT 0.25 - 0.5 ng/mL  Stopping of antibiotics                                             encouraged.     Stopping of antibiotics           encouraged.    ----------------------------     ------------------------------       PCT level decrease by               PCT >= 0.25 ng/mL       < 80% from peak PCT        AND PCT >= 0.5 ng/mL            Continuin g antibiotics                                              encouraged.       Continuing antibiotics            encouraged.    ----------------------------     ------------------------------     PCT level increase compared          PCT > 0.5 ng/mL         with peak PCT AND          PCT >= 0.5 ng/mL             Escalation of antibiotics                                          strongly encouraged.      Escalation of antibiotics        strongly encouraged.   Glucose, capillary     Status: Abnormal   Collection Time: 03/08/17  3:24 AM  Result Value Ref Range   Glucose-Capillary 137 (H) 65 - 99 mg/dL  Lactic acid, plasma     Status: None   Collection Time: 03/08/17  6:09 AM  Result Value Ref Range   Lactic Acid, Venous 1.5 0.5 - 1.9 mmol/L  Glucose, capillary     Status: Abnormal   Collection Time: 03/08/17  6:51 AM  Result Value Ref Range   Glucose-Capillary 140 (H) 65 - 99 mg/dL  Glucose, capillary     Status: Abnormal   Collection Time: 03/08/17 12:06 PM  Result Value Ref Range   Glucose-Capillary 169 (H) 65 - 99 mg/dL  Glucose, capillary     Status: Abnormal   Collection Time: 03/08/17  4:33 PM  Result Value Ref Range   Glucose-Capillary 202 (H) 65 - 99 mg/dL   Comment 1 Notify RN   Glucose, capillary     Status: Abnormal   Collection Time: 03/08/17 10:44 PM  Result Value Ref Range   Glucose-Capillary 190 (H) 65 - 99 mg/dL  Glucose, capillary     Status: Abnormal   Collection Time: 03/09/17  6:22 AM  Result Value Ref Range   Glucose-Capillary 171 (H) 65 - 99 mg/dL  CBC     Status: Abnormal   Collection Time: 03/09/17  7:58 AM  Result Value Ref Range   WBC 6.8 4.0 - 10.5 K/uL   RBC 3.70 (L) 4.22 - 5.81 MIL/uL  Hemoglobin 10.2 (L) 13.0 - 17.0 g/dL   HCT 31.3 (L) 39.0 - 52.0 %   MCV 84.6 78.0 - 100.0 fL   MCH 27.6 26.0 - 34.0 pg   MCHC 32.6 30.0 - 36.0 g/dL   RDW 14.2  11.5 - 15.5 %   Platelets 176 150 - 400 K/uL  Comprehensive metabolic panel     Status: Abnormal   Collection Time: 03/09/17  7:58 AM  Result Value Ref Range   Sodium 136 135 - 145 mmol/L   Potassium 3.8 3.5 - 5.1 mmol/L   Chloride 105 101 - 111 mmol/L   CO2 24 22 - 32 mmol/L   Glucose, Bld 184 (H) 65 - 99 mg/dL   BUN 13 6 - 20 mg/dL   Creatinine, Ser 1.06 0.61 - 1.24 mg/dL   Calcium 8.8 (L) 8.9 - 10.3 mg/dL   Total Protein 5.9 (L) 6.5 - 8.1 g/dL   Albumin 2.9 (L) 3.5 - 5.0 g/dL   AST 17 15 - 41 U/L   ALT 12 (L) 17 - 63 U/L   Alkaline Phosphatase 41 38 - 126 U/L   Total Bilirubin 0.6 0.3 - 1.2 mg/dL   GFR calc non Af Amer 59 (L) >60 mL/min   GFR calc Af Amer >60 >60 mL/min    Comment: (NOTE) The eGFR has been calculated using the CKD EPI equation. This calculation has not been validated in all clinical situations. eGFR's persistently <60 mL/min signify possible Chronic Kidney Disease.    Anion gap 7 5 - 15  Osmolality     Status: None   Collection Time: 03/09/17  7:58 AM  Result Value Ref Range   Osmolality 292 275 - 295 mOsm/kg  Cortisol     Status: None   Collection Time: 03/09/17  7:58 AM  Result Value Ref Range   Cortisol, Plasma 13.3 ug/dL    Comment: (NOTE) AM    6.7 - 22.6 ug/dL PM   <10.0       ug/dL   TSH     Status: None   Collection Time: 03/09/17  7:58 AM  Result Value Ref Range   TSH 3.741 0.350 - 4.500 uIU/mL    Comment: Performed by a 3rd Generation assay with a functional sensitivity of <=0.01 uIU/mL.  Sodium, urine, random     Status: None   Collection Time: 03/09/17  9:53 AM  Result Value Ref Range   Sodium, Ur 47 mmol/L  Osmolality, urine     Status: Abnormal   Collection Time: 03/09/17  9:55 AM  Result Value Ref Range   Osmolality, Ur 168 (L) 300 - 900 mOsm/kg  Glucose, capillary     Status: Abnormal   Collection Time: 03/09/17 11:35 AM  Result Value Ref Range   Glucose-Capillary 181 (H) 65 - 99 mg/dL  CBC w/Diff     Status: Abnormal    Collection Time: 03/14/17 12:16 PM  Result Value Ref Range   WBC 15.0 (H) 4.0 - 10.5 K/uL   RBC 4.36 4.22 - 5.81 Mil/uL   Hemoglobin 12.4 (L) 13.0 - 17.0 g/dL   HCT 37.4 (L) 39.0 - 52.0 %   MCV 85.7 78.0 - 100.0 fl   MCHC 33.2 30.0 - 36.0 g/dL   RDW 15.6 (H) 11.5 - 15.5 %   Platelets 255.0 150.0 - 400.0 K/uL   Neutrophils Relative % 48.5 43.0 - 77.0 %   Lymphocytes Relative 22.7 12.0 - 46.0 %   Monocytes Relative 7.0 3.0 - 12.0 %  Eosinophils Relative 21.6 (H) 0.0 - 5.0 %    Comment: manual diff=[id number],21eos   Basophils Relative 0.2 0.0 - 3.0 %   Neutro Abs 7.3 1.4 - 7.7 K/uL   Lymphs Abs 3.4 0.7 - 4.0 K/uL   Monocytes Absolute 1.0 0.1 - 1.0 K/uL   Eosinophils Absolute 3.2 (H) 0.0 - 0.7 K/uL   Basophils Absolute 0.0 0.0 - 0.1 K/uL  Urine Culture     Status: None   Collection Time: 03/14/17 12:18 PM  Result Value Ref Range   Organism ID, Bacteria NO GROWTH   CBC w/Diff     Status: Abnormal   Collection Time: 03/17/17  2:48 PM  Result Value Ref Range   WBC 21.0 Repeated and verified X2. (HH) 4.0 - 10.5 K/uL   RBC 4.30 4.22 - 5.81 Mil/uL   Hemoglobin 12.3 (L) 13.0 - 17.0 g/dL   HCT 36.9 (L) 39.0 - 52.0 %   MCV 85.9 78.0 - 100.0 fl   MCHC 33.4 30.0 - 36.0 g/dL   RDW 15.0 11.5 - 15.5 %   Platelets 235.0 150.0 - 400.0 K/uL   Neutrophils Relative % 41.5 (L) 43.0 - 77.0 %   Lymphocytes Relative 20.5 12.0 - 46.0 %   Monocytes Relative 6.6 3.0 - 12.0 %   Eosinophils Relative 31.0 (H) 0.0 - 5.0 %    Comment: A Manual Differential was performed and is consistent with the Automated Differential.   Basophils Relative 0.4 0.0 - 3.0 %   Neutro Abs 8.7 (H) 1.4 - 7.7 K/uL   Lymphs Abs 4.3 (H) 0.7 - 4.0 K/uL   Monocytes Absolute 1.4 (H) 0.1 - 1.0 K/uL   Eosinophils Absolute 6.5 (H) 0.0 - 0.7 K/uL   Basophils Absolute 0.1 0.0 - 0.1 K/uL  Comp Met (CMET)     Status: Abnormal   Collection Time: 03/17/17  2:48 PM  Result Value Ref Range   Sodium 134 (L) 135 - 145 mEq/L    Potassium 4.7 3.5 - 5.1 mEq/L   Chloride 99 96 - 112 mEq/L   CO2 26 19 - 32 mEq/L   Glucose, Bld 189 (H) 70 - 99 mg/dL   BUN 17 6 - 23 mg/dL   Creatinine, Ser 1.13 0.40 - 1.50 mg/dL   Total Bilirubin 0.3 0.2 - 1.2 mg/dL   Alkaline Phosphatase 66 39 - 117 U/L   AST 13 0 - 37 U/L   ALT 12 0 - 53 U/L   Total Protein 6.6 6.0 - 8.3 g/dL   Albumin 3.7 3.5 - 5.2 g/dL   Calcium 10.1 8.4 - 10.5 mg/dL   GFR 64.60 >60.00 mL/min  IBC panel     Status: Abnormal   Collection Time: 03/17/17  2:48 PM  Result Value Ref Range   Iron 56 42 - 165 ug/dL   Transferrin 205.0 (L) 212.0 - 360.0 mg/dL   Saturation Ratios 19.5 (L) 20.0 - 50.0 %  Urinalysis, Routine w reflex microscopic     Status: None   Collection Time: 03/17/17  5:20 PM  Result Value Ref Range   Color, Urine YELLOW YELLOW   APPearance CLEAR CLEAR   Specific Gravity, Urine 1.007 1.005 - 1.030   pH 6.5 5.0 - 8.0   Glucose, UA NEGATIVE NEGATIVE mg/dL   Hgb urine dipstick NEGATIVE NEGATIVE   Bilirubin Urine NEGATIVE NEGATIVE   Ketones, ur NEGATIVE NEGATIVE mg/dL   Protein, ur NEGATIVE NEGATIVE mg/dL   Nitrite NEGATIVE NEGATIVE   Leukocytes, UA NEGATIVE NEGATIVE  Comment: Microscopic not done on urines with negative protein, blood, leukocytes, nitrite, or glucose < 500 mg/dL.  Blood culture (routine x 2)     Status: None   Collection Time: 03/17/17  6:25 PM  Result Value Ref Range   Specimen Description BLOOD RIGHT ANTECUBITAL    Special Requests      BOTTLES DRAWN AEROBIC AND ANAEROBIC Blood Culture adequate volume   Culture      NO GROWTH 5 DAYS Performed at [facility], [place] [address] [place], [place] [address]    Report Status 03/22/2017 FINAL   Blood culture (routine x 2)     Status: None   Collection Time: 03/17/17  7:50 PM  Result Value Ref Range   Specimen Description BLOOD RIGHT HAND    Special Requests      BOTTLES DRAWN AEROBIC AND ANAEROBIC Blood Culture adequate volume   Culture      NO GROWTH 5  DAYS Performed at [facility], [place] [address], [place], [place] [address]    Report Status 03/22/2017 FINAL   POCT Urinalysis Dipstick     Status: Normal   Collection Time: 03/20/17  9:59 AM  Result Value Ref Range   Color, UA light yellow    Clarity, UA clear    Glucose, UA negative    Bilirubin, UA negative    Ketones, UA negative    Spec Grav, UA 1.015 1.010 - 1.025   Blood, UA negative    pH, UA 6.0 5.0 - 8.0   Protein, UA negative    Urobilinogen, UA 0.2 0.2 or 1.0 E.U./dL   Nitrite, UA negative    Leukocytes, UA Negative Negative  CBC w/Diff     Status: Abnormal   Collection Time: 03/20/17 10:21 AM  Result Value Ref Range   WBC 22.4 Repeated and verified X2. (HH) 4.0 - 10.5 K/uL   RBC 4.54 4.22 - 5.81 Mil/uL   Hemoglobin 13.1 13.0 - 17.0 g/dL   HCT 38.9 (L) 39.0 - 52.0 %   MCV 85.7 78.0 - 100.0 fl   MCHC 33.8 30.0 - 36.0 g/dL   RDW 15.5 11.5 - 15.5 %   Platelets 220.0 150.0 - 400.0 K/uL   Neutrophils Relative % 52.8 43.0 - 77.0 %   Lymphocytes Relative 18.1 12.0 - 46.0 %   Monocytes Relative 4.7 3.0 - 12.0 %   Eosinophils Relative 24.0 (H) 0.0 - 5.0 %   Basophils Relative 0.4 0.0 - 3.0 %   Neutro Abs 11.8 (H) 1.4 - 7.7 K/uL   Lymphs Abs 4.1 (H) 0.7 - 4.0 K/uL   Monocytes Absolute 1.1 (H) 0.1 - 1.0 K/uL   Eosinophils Absolute 5.4 (H) 0.0 - 0.7 K/uL   Basophils Absolute 0.1 0.0 - 0.1 K/uL  Comp Met (CMET)     Status: Abnormal   Collection Time: 03/20/17 10:21 AM  Result Value Ref Range   Sodium 137 135 - 145 mEq/L   Potassium 5.4 (H) 3.5 - 5.1 mEq/L   Chloride 100 96 - 112 mEq/L   CO2 25 19 - 32 mEq/L   Glucose, Bld 191 (H) 70 - 99 mg/dL   BUN 16 6 - 23 mg/dL   Creatinine, Ser 1.05 0.40 - 1.50 mg/dL   Total Bilirubin 0.3 0.2 - 1.2 mg/dL   Alkaline Phosphatase 68 39 - 117 U/L   AST 12 0 - 37 U/L   ALT 11 0 - 53 U/L   Total Protein 6.6 6.0 -  8.3 g/dL   Albumin 3.9 3.5 - 5.2 g/dL   Calcium 10.0 8.4 - 10.5 mg/dL   GFR 70.31 >60.00 mL/min  Clostridium  Difficile by PCR     Status: Abnormal   Collection Time: 03/20/17  4:35 PM  Result Value Ref Range   Toxigenic C Difficile by pcr Detected (AA) Not Detected    Comment: This test is for use only with liquid or soft stools; performance characteristics of other clinical specimen types have not been established.   This assay was performed by Cepheid GeneXpert(R) PCR. The performance characteristics of this assay have been determined by [employer]. Performance characteristics refer to the analytical performance of the test.   Stool Culture     Status: None   Collection Time: 03/20/17  4:35 PM  Result Value Ref Range   Organism ID, Bacteria NO SALMONELLA OR SHIGELLA ISOLATED     Comment: NO ENTERIC CAMPYLOBACTER ISOLATED NO ESCHERICHIA COLI 0157 ISOLATED   Ova and parasite examination     Status: None   Collection Time: 03/20/17  4:35 PM  Result Value Ref Range   OP No Ova or Parasites Seen    Urinalysis, Routine w reflex microscopic     Status: Abnormal   Collection Time: 03/24/17  1:30 PM  Result Value Ref Range   Color, Urine YELLOW YELLOW   APPearance CLOUDY (A) CLEAR   Specific Gravity, Urine 1.010 1.005 - 1.030   pH 6.5 5.0 - 8.0   Glucose, UA 250 (A) NEGATIVE mg/dL   Hgb urine dipstick LARGE (A) NEGATIVE   Bilirubin Urine NEGATIVE NEGATIVE   Ketones, ur NEGATIVE NEGATIVE mg/dL   Protein, ur 30 (A) NEGATIVE mg/dL   Nitrite NEGATIVE NEGATIVE   Leukocytes, UA LARGE (A) NEGATIVE  Urinalysis, Microscopic (reflex)     Status: Abnormal   Collection Time: 03/24/17  1:30 PM  Result Value Ref Range   RBC / HPF TOO NUMEROUS TO COUNT 0 - 5 RBC/hpf   WBC, UA TOO NUMEROUS TO COUNT 0 - 5 WBC/hpf   Bacteria, UA FEW (A) NONE SEEN   Squamous Epithelial / LPF 0-5 (A) NONE SEEN  Urine Culture     Status: Abnormal   Collection Time: 03/24/17  1:30 PM  Result Value Ref Range   Specimen Description URINE, CATHETERIZED    Special Requests PATIENT ON VANC. NEED SENSITIVITIES IF  GPOS    Culture 30,000 COLONIES/mL PSEUDOMONAS AERUGINOSA (A)    Report Status 03/26/2017 FINAL    Organism ID, Bacteria PSEUDOMONAS AERUGINOSA (A)       Susceptibility   Pseudomonas aeruginosa - MIC*    CEFTAZIDIME 4 SENSITIVE Sensitive     CIPROFLOXACIN >=4 RESISTANT Resistant     GENTAMICIN >=16 RESISTANT Resistant     IMIPENEM >=16 RESISTANT Resistant     PIP/TAZO 16 SENSITIVE Sensitive     CEFEPIME 4 SENSITIVE Sensitive     * 30,000 COLONIES/mL PSEUDOMONAS AERUGINOSA  CBG monitoring, ED     Status: Abnormal   Collection Time: 03/24/17  3:38 PM  Result Value Ref Range   Glucose-Capillary 137 (H) 65 - 99 mg/dL    Assessment/Plan: C. difficile diarrhea Completes Vancomycin treatment today. Feeling much better. Will repeat labs today. Continue diet and probiotic. Follow-up with ID as scheduled this week.   Leukocytosis Hoping solely secondary to c diff infection that is now treatment. Energy levels significantly improved. Repeat CBC w diff today. Will keep Hematology appointment in case not resolving as this would raise  concern for malignancy.    Leeanne Rio, PA-C

## 2017-04-02 NOTE — Patient Instructions (Signed)
Please go to the lab for blood work. I will call you with your results.  Continue chronic medications as directed. Continue cath as directed by Urology. I will be placing order for more supplies for you.  Follow-up with Dr. Lilia Pro as scheduled. Continue probiotic. Get good dietary fiber.  Follow-up 2 weeks.

## 2017-04-02 NOTE — Assessment & Plan Note (Signed)
Hoping solely secondary to c diff infection that is now treatment. Energy levels significantly improved. Repeat CBC w diff today. Will keep Hematology appointment in case not resolving as this would raise concern for malignancy.

## 2017-04-02 NOTE — Assessment & Plan Note (Signed)
Completes Vancomycin treatment today. Feeling much better. Will repeat labs today. Continue diet and probiotic. Follow-up with ID as scheduled this week.

## 2017-04-03 ENCOUNTER — Other Ambulatory Visit: Payer: Self-pay | Admitting: Family

## 2017-04-03 DIAGNOSIS — D721 Eosinophilia, unspecified: Secondary | ICD-10-CM

## 2017-04-03 DIAGNOSIS — D72829 Elevated white blood cell count, unspecified: Secondary | ICD-10-CM

## 2017-04-04 ENCOUNTER — Ambulatory Visit: Payer: Medicare Other

## 2017-04-04 ENCOUNTER — Other Ambulatory Visit (HOSPITAL_BASED_OUTPATIENT_CLINIC_OR_DEPARTMENT_OTHER): Payer: Medicare Other

## 2017-04-04 DIAGNOSIS — D72829 Elevated white blood cell count, unspecified: Secondary | ICD-10-CM

## 2017-04-04 DIAGNOSIS — D721 Eosinophilia, unspecified: Secondary | ICD-10-CM

## 2017-04-04 LAB — COMPREHENSIVE METABOLIC PANEL (CC13)
A/G RATIO: 1.5 (ref 1.2–2.2)
ALBUMIN: 4.1 g/dL (ref 3.2–4.6)
ALT: 16 IU/L (ref 0–44)
AST (SGOT): 16 IU/L (ref 0–40)
Alkaline Phosphatase, S: 55 IU/L (ref 39–117)
BILIRUBIN TOTAL: 0.3 mg/dL (ref 0.0–1.2)
BUN / CREAT RATIO: 10 (ref 10–24)
BUN: 11 mg/dL (ref 10–36)
CHLORIDE: 101 mmol/L (ref 96–106)
Calcium, Ser: 10.6 mg/dL — ABNORMAL HIGH (ref 8.6–10.2)
Carbon Dioxide, Total: 26 mmol/L (ref 20–29)
Creatinine, Ser: 1.06 mg/dL (ref 0.76–1.27)
GFR calc Af Amer: 71 mL/min/{1.73_m2} (ref >59)
GFR calc non Af Amer: 61 mL/min/{1.73_m2} (ref >59)
GLOBULIN, TOTAL: 2.8 g/dL (ref 1.5–4.5)
Glucose: 143 mg/dL — ABNORMAL HIGH (ref 65–99)
POTASSIUM: 5.2 mmol/L (ref 3.5–5.2)
SODIUM: 140 mmol/L (ref 134–144)
TOTAL PROTEIN: 6.9 g/dL (ref 6.0–8.5)

## 2017-04-04 LAB — CBC WITH DIFFERENTIAL (CANCER CENTER ONLY)
BASO#: 0.1 10*3/uL (ref 0.0–0.2)
BASO%: 0.5 % (ref 0.0–2.0)
EOS ABS: 10.2 10*3/uL — AB (ref 0.0–0.5)
EOS%: 52.3 % — ABNORMAL HIGH (ref 0.0–7.0)
HCT: 35.7 % — ABNORMAL LOW (ref 38.7–49.9)
HEMOGLOBIN: 12.3 g/dL — AB (ref 13.0–17.1)
LYMPH#: 3.4 10*3/uL — ABNORMAL HIGH (ref 0.9–3.3)
LYMPH%: 17.6 % (ref 14.0–48.0)
MCH: 29.8 pg (ref 28.0–33.4)
MCHC: 34.5 g/dL (ref 32.0–35.9)
MCV: 86 fL (ref 82–98)
MONO#: 1 10*3/uL — AB (ref 0.1–0.9)
MONO%: 5.3 % (ref 0.0–13.0)
NEUT%: 24.3 % — ABNORMAL LOW (ref 40.0–80.0)
NEUTROS ABS: 4.7 10*3/uL (ref 1.5–6.5)
PLATELETS: 215 10*3/uL (ref 145–400)
RBC: 4.13 10*6/uL — ABNORMAL LOW (ref 4.20–5.70)
RDW: 14.6 % (ref 11.1–15.7)
WBC: 19.5 10*3/uL — ABNORMAL HIGH (ref 4.0–10.0)

## 2017-04-04 LAB — CHCC SATELLITE - SMEAR

## 2017-04-04 LAB — LACTATE DEHYDROGENASE: LDH: 173 U/L (ref 125–245)

## 2017-04-07 ENCOUNTER — Other Ambulatory Visit: Payer: Self-pay | Admitting: *Deleted

## 2017-04-07 ENCOUNTER — Other Ambulatory Visit (HOSPITAL_COMMUNITY)
Admission: RE | Admit: 2017-04-07 | Discharge: 2017-04-07 | Disposition: A | Discharge status: Home / Self Care | Payer: Medicare Other | Source: Ambulatory Visit | Attending: Hematology & Oncology | Admitting: Hematology & Oncology

## 2017-04-07 ENCOUNTER — Ambulatory Visit (HOSPITAL_BASED_OUTPATIENT_CLINIC_OR_DEPARTMENT_OTHER): Payer: Medicare Other | Admitting: Hematology & Oncology

## 2017-04-07 ENCOUNTER — Ambulatory Visit (HOSPITAL_BASED_OUTPATIENT_CLINIC_OR_DEPARTMENT_OTHER): Payer: Medicare Other

## 2017-04-07 ENCOUNTER — Other Ambulatory Visit: Payer: Self-pay | Admitting: Family

## 2017-04-07 VITALS — BP 143/75 | HR 61 | Temp 97.4°F | Resp 16 | Ht 66.0 in | Wt 164.0 lb

## 2017-04-07 DIAGNOSIS — D72829 Elevated white blood cell count, unspecified: Secondary | ICD-10-CM

## 2017-04-07 DIAGNOSIS — R888 Abnormal findings in other body fluids and substances: Secondary | ICD-10-CM | POA: Diagnosis not present

## 2017-04-07 DIAGNOSIS — D721 Eosinophilia, unspecified: Secondary | ICD-10-CM

## 2017-04-07 LAB — FLOW CYTOMETRY - CHCC SATELLITE

## 2017-04-07 NOTE — Addendum Note (Signed)
Addended by: Burney Gauze R on: 04/07/2017 03:10 PM   Modules accepted: Orders

## 2017-04-07 NOTE — Progress Notes (Signed)
Referral MD  Reason for Referral: Leukocytosis with eosinophilia; chronic in and out urinary catheterization; C. difficile colitis   Chief Complaint  Patient presents with  . New Patient (Initial Visit)  : My white blood cells are high.  HPI: Gerald Holt is an incredibly engaging 83 year old white male. First off, he is a true American hero. He served in [place]. He was in [place]. 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 It was absolutely fascinating talking to him about classical music and all of his experiences. He is met so many world famous pianist. He has so many stories to tell.  Unfortunately, he has a neurogenic bladder. He gets in and out catheterization 4-5 times a day. He has been hospitalized with urinary tract infections. The reason had C. difficile colitis.  He has developed a leukocytosis with eosinophilia. Going back through the records,  back in April of this year, his white cell count was normal. His white cell count was 7.3. His hemoglobin 12.7. Platelet count 191,000. He had 9% eosinophils.  He has been hospitalized since then, he's been on multiple antibiotics. He's had enterococcus in his urine. He's had Escherichia coli in his urine. He has had Pseudomonas.  He also has had C. difficile. Again he's been on multiple antibiotics.  His white cell count subsequently has gone up. This past Friday, his white cell count was 19.5. Hemoglobin 12.3. Platelet count 215,000. His eosinophils were 52%.  He did have a CT scan done in June. This was of the abdomen. This did not show any hepato-splenomegaly. There is no lymphadenopathy.   The C. difficile seems to be getting a little bit better.   There is no nausea or vomiting. He's had no pulmonary issues. He's had no rashes. He is not traveled recently.   There's been no weight loss or weight gain.   He's had no obvious change in  medications outside of the antibiotics that he has been on.   He comes in with his wife and caretaker. They're both very nice.   He does not smoke. He does not have alcohol. He is a diabetic. He is not on insulin.   There's been no blood issues in the family.   He did have a stroke a few years ago. He does have paroxysmal atrial fibrillation. He has had some dermatitis.   Overall, his performance status is ECOG 3.    Past Medical History:  Diagnosis Date  . A-fib ([place])   . Cataracts, bilateral   . Chronic dermatitis   . Diabetes type 2, controlled ([place]) 2000  . History of chicken pox   . Mumps    Adult  . Retinopathy   . Shingles   . Stroke [facility]) Nov 2011  . Undescended testicle, unilateral    Right  . Whooping cough   :  Past Surgical History:  Procedure Laterality Date  . CARDIAC VALVE REPLACEMENT    . PRE-MALIGNANT / BENIGN SKIN LESION EXCISION    . TESTICLE SURGERY     Right, undescended  . TOOTH EXTRACTION    :   Current Outpatient Prescriptions:  .  aspirin 81 MG tablet, Take 1 tablet (81 mg total) by mouth daily., Disp: , Rfl:  .  Black Elderberry,Berry-Flower, 575 MG CAPS, Take 1 capsule by mouth 3 (three) times daily with meals., Disp: , Rfl:  .  Calcium Carbonate  Antacid (ALKA-SELTZER ANTACID PO), Take 2 tablets by mouth daily as needed (for indegestion). , Disp: , Rfl:  .  Cranberry 1000 MG CAPS, Take 2 capsules by mouth daily., Disp: , Rfl:  .  Digestive Enzyme CAPS, Take by mouth. 1 daily with every meal, Disp: , Rfl:  .  docusate sodium (COLACE) 100 MG capsule, Take 200 mg by mouth daily as needed for mild constipation. , Disp: , Rfl:  .  Eyelid Cleansers (STERILID EX), Apply 1 application topically See admin instructions. Eye Wash: Once Daily , Disp: , Rfl:  .  glucose blood test strip, One Touch Verio strips Use as instructed to check fasting sugars once daily .Dx:E11.9, Disp: 100 each, Rfl: 12 .  levothyroxine (SYNTHROID, LEVOTHROID) 50 MCG tablet,  Take 1 tablet (50 mcg total) by mouth daily., Disp: 90 tablet, Rfl: 3 .  lovastatin (MEVACOR) 10 MG tablet, TAKE 1 TABLET AT BEDTIME, Disp: 90 tablet, Rfl: 1 .  metFORMIN (GLUCOPHAGE-XR) 500 MG 24 hr tablet, TAKE 2 TABLETS WITH BREAKFAST AND AT BEDTIME, Disp: 360 tablet, Rfl: 1 .  metoprolol tartrate (LOPRESSOR) 25 MG tablet, Take 0.5 tablets (12.5 mg total) by mouth 2 (two) times daily., Disp: 180 tablet, Rfl: 1 .  Miconazole Nitrate (TRIPLE PASTE AF) 2 % OINT, Apply 1 application topically daily as needed (mouth care). , Disp: , Rfl:  .  Omega-3 Fatty Acids (SB OMEGA-3 FISH OIL) 1000 MG CAPS, Take 1,000 mg by mouth daily. Reported on 10/06/2015, Disp: , Rfl:  .  OVER THE COUNTER MEDICATION, Take 1 capsule by mouth 2 (two) times daily. OTC Focus Select, Disp: , Rfl:  .  triamcinolone cream (KENALOG) 0.1 %, Apply 1 application topically daily as needed (for itching). , Disp: , Rfl: :  :  Allergies  Allergen Reactions  . Flomax [Tamsulosin Hcl] Other (See Comments)    Hypotension  . Shellfish Allergy Anaphylaxis  . Aspirin-Dipyridamole Er Other (See Comments)    Headaches   . Other Other (See Comments)    Blood Thinner given in Rehab gave H/As - Not Coumadin/Headaches    :  Family History  Problem Relation Age of Onset  . Pneumonia Mother 77       Deceased  . GI Bleed Father 16       Deceased - Ulcers  . Diabetes Cousin   . Diabetes Brother   . Cancer Paternal Aunt   :  Social History   Social History  . Marital status: Married    Spouse name: N/A  . Number of children: N/A  . Years of education: N/A   Occupational History  . Not on file.   Social History Main Topics  . Smoking status: Former Research scientist (life sciences)  . Smokeless tobacco: Never Used     Comment: Hasn't smoked since age 66  . Alcohol use No  . Drug use: No  . Sexual activity: Not on file   Other Topics Concern  . Not on file   Social History Narrative   Lives with wife in a one story home.  Has 1 child.  Retired  Armed forces technical officer.  Education: masters.    :  Pertinent items are noted in HPI.  Exam:Elderly white male in no obvious distress. Vital signs show a temperature of 97.4. Pulse 61. Blood pressure 143/75. Weight is 164 pounds. Head and neck exam shows some facial dermatitis. He has no oral lesions. He has no scleral icterus. There is no adenopathy in the neck. Lungs are clear bilaterally.  Cardiac exam irregular rate and rhythm consistent with atrial fibrillation. The rate is well controlled. Abdomen is soft. He has decent bowel sounds. There is no fluid wave. There is no guarding or rebound tenderness. There is no palpable liver or spleen tip. Back exam shows no tenderness over the spine, ribs or hips. Extremities shows no clubbing, cyanosis or edema. He has some osteoarthritic changes in his joints. Skin exam shows no rashes, ecchymoses or petechia, outside of that on his face. Neurological exam is nonfocal.   No results for input(s): WBC, HGB, HCT, PLT in the last 72 hours. No results for input(s): NA, K, CL, CO2, GLUCOSE, BUN, CREATININE, CALCIUM in the last 72 hours.  Blood smear review:  Normochromic and normocytic population of red blood cells. There are no nucleated red blood cells. There are no teardrop cells. I see no inclusion bodies. There is no rouleau formation. White cells are increased in number. There is an increase in eosinophils. They appear mature. I see no blasts. I see no atypical lymphocytes. Platelets are adequate in number and size.  Pathology: None     Assessment and Plan:  Mr. Gerald Holt is a incredibly interesting 83 year old white male. He has eosinophilia. I have to believe that this is all from these infections and antibiotics. His blood counts were fine 3 months ago. Then, he began to have these infections. He's been hospitalized.  Under the microscope, I do not see any immature in eosinophils. His eosinophils appear mature and possibly reactive.  I don't suspect that he  has any myeloproliferative issue. However, I will send off a panel of genetic studies to help rule out hypereosinophilic syndrome.  I don't think we have to do a bone marrow test on him.  I'm willing just to watch him. His exam is unremarkable. He is not anemic. There is no splenomegaly. There is no palpable lymphadenopathy.  I spent over an hour with he and his wife/caretaker. I reviewed all the labs. I made my recommendations. They are all in agreement.  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  I did thank him for his service to our country. He is truly part of [place]'s greatest generation.

## 2017-04-07 NOTE — Addendum Note (Signed)
Addended by: Burney Gauze R on: 04/07/2017 03:13 PM   Modules accepted: Orders

## 2017-04-09 DIAGNOSIS — N138 Other obstructive and reflux uropathy: Secondary | ICD-10-CM | POA: Diagnosis not present

## 2017-04-09 DIAGNOSIS — T83511D Infection and inflammatory reaction due to indwelling urethral catheter, subsequent encounter: Secondary | ICD-10-CM | POA: Diagnosis not present

## 2017-04-09 DIAGNOSIS — R339 Retention of urine, unspecified: Secondary | ICD-10-CM | POA: Diagnosis not present

## 2017-04-09 DIAGNOSIS — Z789 Other specified health status: Secondary | ICD-10-CM | POA: Diagnosis not present

## 2017-04-09 DIAGNOSIS — B028 Zoster with other complications: Secondary | ICD-10-CM | POA: Diagnosis not present

## 2017-04-09 DIAGNOSIS — A0472 Enterocolitis due to Clostridium difficile, not specified as recurrent: Secondary | ICD-10-CM | POA: Diagnosis not present

## 2017-04-09 DIAGNOSIS — N390 Urinary tract infection, site not specified: Secondary | ICD-10-CM | POA: Diagnosis not present

## 2017-04-09 DIAGNOSIS — N401 Enlarged prostate with lower urinary tract symptoms: Secondary | ICD-10-CM | POA: Diagnosis not present

## 2017-04-09 DIAGNOSIS — T83510A Infection and inflammatory reaction due to cystostomy catheter, initial encounter: Secondary | ICD-10-CM | POA: Diagnosis not present

## 2017-04-10 ENCOUNTER — Encounter: Payer: Self-pay | Admitting: Hematology & Oncology

## 2017-04-11 ENCOUNTER — Other Ambulatory Visit: Payer: Self-pay

## 2017-04-11 ENCOUNTER — Encounter: Payer: Self-pay | Admitting: Physician Assistant

## 2017-04-11 ENCOUNTER — Emergency Department (HOSPITAL_BASED_OUTPATIENT_CLINIC_OR_DEPARTMENT_OTHER): Payer: Medicare Other

## 2017-04-11 ENCOUNTER — Inpatient Hospital Stay (HOSPITAL_BASED_OUTPATIENT_CLINIC_OR_DEPARTMENT_OTHER)
Admission: EM | Admit: 2017-04-11 | Discharge: 2017-04-16 | DRG: 871 | Disposition: A | Discharge status: Skilled Nursing Facility | Payer: Medicare Other | Attending: Internal Medicine | Admitting: Internal Medicine

## 2017-04-11 ENCOUNTER — Other Ambulatory Visit (HOSPITAL_BASED_OUTPATIENT_CLINIC_OR_DEPARTMENT_OTHER): Payer: Self-pay

## 2017-04-11 ENCOUNTER — Encounter (HOSPITAL_BASED_OUTPATIENT_CLINIC_OR_DEPARTMENT_OTHER): Payer: Self-pay | Admitting: *Deleted

## 2017-04-11 DIAGNOSIS — I10 Essential (primary) hypertension: Secondary | ICD-10-CM | POA: Diagnosis not present

## 2017-04-11 DIAGNOSIS — E118 Type 2 diabetes mellitus with unspecified complications: Secondary | ICD-10-CM | POA: Diagnosis not present

## 2017-04-11 DIAGNOSIS — A419 Sepsis, unspecified organism: Secondary | ICD-10-CM | POA: Diagnosis not present

## 2017-04-11 DIAGNOSIS — I5033 Acute on chronic diastolic (congestive) heart failure: Secondary | ICD-10-CM | POA: Diagnosis not present

## 2017-04-11 DIAGNOSIS — A4189 Other specified sepsis: Secondary | ICD-10-CM | POA: Diagnosis not present

## 2017-04-11 DIAGNOSIS — Z515 Encounter for palliative care: Secondary | ICD-10-CM | POA: Diagnosis not present

## 2017-04-11 DIAGNOSIS — E1165 Type 2 diabetes mellitus with hyperglycemia: Secondary | ICD-10-CM | POA: Diagnosis present

## 2017-04-11 DIAGNOSIS — E785 Hyperlipidemia, unspecified: Secondary | ICD-10-CM | POA: Diagnosis present

## 2017-04-11 DIAGNOSIS — R000 Tachycardia, unspecified: Secondary | ICD-10-CM | POA: Diagnosis not present

## 2017-04-11 DIAGNOSIS — Z79899 Other long term (current) drug therapy: Secondary | ICD-10-CM

## 2017-04-11 DIAGNOSIS — B49 Unspecified mycosis: Secondary | ICD-10-CM | POA: Diagnosis present

## 2017-04-11 DIAGNOSIS — E871 Hypo-osmolality and hyponatremia: Secondary | ICD-10-CM | POA: Diagnosis not present

## 2017-04-11 DIAGNOSIS — W010XXA Fall on same level from slipping, tripping and stumbling without subsequent striking against object, initial encounter: Secondary | ICD-10-CM | POA: Diagnosis not present

## 2017-04-11 DIAGNOSIS — E1122 Type 2 diabetes mellitus with diabetic chronic kidney disease: Secondary | ICD-10-CM | POA: Diagnosis present

## 2017-04-11 DIAGNOSIS — I4891 Unspecified atrial fibrillation: Secondary | ICD-10-CM

## 2017-04-11 DIAGNOSIS — I482 Chronic atrial fibrillation: Secondary | ICD-10-CM | POA: Diagnosis not present

## 2017-04-11 DIAGNOSIS — Z66 Do not resuscitate: Secondary | ICD-10-CM | POA: Diagnosis present

## 2017-04-11 DIAGNOSIS — Z888 Allergy status to other drugs, medicaments and biological substances status: Secondary | ICD-10-CM

## 2017-04-11 DIAGNOSIS — N182 Chronic kidney disease, stage 2 (mild): Secondary | ICD-10-CM | POA: Diagnosis present

## 2017-04-11 DIAGNOSIS — I4892 Unspecified atrial flutter: Secondary | ICD-10-CM | POA: Diagnosis present

## 2017-04-11 DIAGNOSIS — I69328 Other speech and language deficits following cerebral infarction: Secondary | ICD-10-CM | POA: Diagnosis not present

## 2017-04-11 DIAGNOSIS — I63431 Cerebral infarction due to embolism of right posterior cerebral artery: Secondary | ICD-10-CM | POA: Diagnosis not present

## 2017-04-11 DIAGNOSIS — I480 Paroxysmal atrial fibrillation: Secondary | ICD-10-CM | POA: Diagnosis present

## 2017-04-11 DIAGNOSIS — I6310 Cerebral infarction due to embolism of unspecified precerebral artery: Secondary | ICD-10-CM

## 2017-04-11 DIAGNOSIS — Z7984 Long term (current) use of oral hypoglycemic drugs: Secondary | ICD-10-CM

## 2017-04-11 DIAGNOSIS — D638 Anemia in other chronic diseases classified elsewhere: Secondary | ICD-10-CM | POA: Diagnosis present

## 2017-04-11 DIAGNOSIS — G8194 Hemiplegia, unspecified affecting left nondominant side: Secondary | ICD-10-CM | POA: Diagnosis present

## 2017-04-11 DIAGNOSIS — J9600 Acute respiratory failure, unspecified whether with hypoxia or hypercapnia: Secondary | ICD-10-CM | POA: Diagnosis not present

## 2017-04-11 DIAGNOSIS — R29818 Other symptoms and signs involving the nervous system: Secondary | ICD-10-CM | POA: Diagnosis not present

## 2017-04-11 DIAGNOSIS — R778 Other specified abnormalities of plasma proteins: Secondary | ICD-10-CM

## 2017-04-11 DIAGNOSIS — I499 Cardiac arrhythmia, unspecified: Secondary | ICD-10-CM | POA: Diagnosis not present

## 2017-04-11 DIAGNOSIS — I248 Other forms of acute ischemic heart disease: Secondary | ICD-10-CM | POA: Diagnosis not present

## 2017-04-11 DIAGNOSIS — E872 Acidosis, unspecified: Secondary | ICD-10-CM

## 2017-04-11 DIAGNOSIS — Z91013 Allergy to seafood: Secondary | ICD-10-CM

## 2017-04-11 DIAGNOSIS — R748 Abnormal levels of other serum enzymes: Secondary | ICD-10-CM | POA: Diagnosis not present

## 2017-04-11 DIAGNOSIS — Z8673 Personal history of transient ischemic attack (TIA), and cerebral infarction without residual deficits: Secondary | ICD-10-CM | POA: Insufficient documentation

## 2017-04-11 DIAGNOSIS — R7989 Other specified abnormal findings of blood chemistry: Secondary | ICD-10-CM

## 2017-04-11 DIAGNOSIS — Z833 Family history of diabetes mellitus: Secondary | ICD-10-CM

## 2017-04-11 DIAGNOSIS — H53462 Homonymous bilateral field defects, left side: Secondary | ICD-10-CM | POA: Diagnosis present

## 2017-04-11 DIAGNOSIS — I6930 Unspecified sequelae of cerebral infarction: Secondary | ICD-10-CM | POA: Diagnosis not present

## 2017-04-11 DIAGNOSIS — R0789 Other chest pain: Secondary | ICD-10-CM | POA: Diagnosis not present

## 2017-04-11 DIAGNOSIS — Z87440 Personal history of urinary (tract) infections: Secondary | ICD-10-CM | POA: Diagnosis not present

## 2017-04-11 DIAGNOSIS — R339 Retention of urine, unspecified: Secondary | ICD-10-CM | POA: Diagnosis present

## 2017-04-11 DIAGNOSIS — Z7982 Long term (current) use of aspirin: Secondary | ICD-10-CM

## 2017-04-11 DIAGNOSIS — B9689 Other specified bacterial agents as the cause of diseases classified elsewhere: Secondary | ICD-10-CM | POA: Diagnosis not present

## 2017-04-11 DIAGNOSIS — K219 Gastro-esophageal reflux disease without esophagitis: Secondary | ICD-10-CM | POA: Diagnosis present

## 2017-04-11 DIAGNOSIS — N179 Acute kidney failure, unspecified: Secondary | ICD-10-CM | POA: Diagnosis present

## 2017-04-11 DIAGNOSIS — I69392 Facial weakness following cerebral infarction: Secondary | ICD-10-CM | POA: Diagnosis not present

## 2017-04-11 DIAGNOSIS — E039 Hypothyroidism, unspecified: Secondary | ICD-10-CM | POA: Diagnosis present

## 2017-04-11 DIAGNOSIS — F0390 Unspecified dementia without behavioral disturbance: Secondary | ICD-10-CM | POA: Diagnosis present

## 2017-04-11 DIAGNOSIS — Z87891 Personal history of nicotine dependence: Secondary | ICD-10-CM

## 2017-04-11 DIAGNOSIS — R531 Weakness: Secondary | ICD-10-CM | POA: Diagnosis not present

## 2017-04-11 DIAGNOSIS — I639 Cerebral infarction, unspecified: Secondary | ICD-10-CM | POA: Diagnosis not present

## 2017-04-11 DIAGNOSIS — R079 Chest pain, unspecified: Secondary | ICD-10-CM

## 2017-04-11 DIAGNOSIS — R0602 Shortness of breath: Secondary | ICD-10-CM | POA: Diagnosis not present

## 2017-04-11 DIAGNOSIS — Z886 Allergy status to analgesic agent status: Secondary | ICD-10-CM

## 2017-04-11 DIAGNOSIS — IMO0001 Reserved for inherently not codable concepts without codable children: Secondary | ICD-10-CM

## 2017-04-11 DIAGNOSIS — I4510 Unspecified right bundle-branch block: Secondary | ICD-10-CM | POA: Diagnosis present

## 2017-04-11 DIAGNOSIS — Z9181 History of falling: Secondary | ICD-10-CM

## 2017-04-11 DIAGNOSIS — E11319 Type 2 diabetes mellitus with unspecified diabetic retinopathy without macular edema: Secondary | ICD-10-CM | POA: Diagnosis present

## 2017-04-11 DIAGNOSIS — E876 Hypokalemia: Secondary | ICD-10-CM | POA: Diagnosis present

## 2017-04-11 DIAGNOSIS — I130 Hypertensive heart and chronic kidney disease with heart failure and stage 1 through stage 4 chronic kidney disease, or unspecified chronic kidney disease: Secondary | ICD-10-CM | POA: Diagnosis present

## 2017-04-11 DIAGNOSIS — R29707 NIHSS score 7: Secondary | ICD-10-CM | POA: Diagnosis not present

## 2017-04-11 DIAGNOSIS — A0472 Enterocolitis due to Clostridium difficile, not specified as recurrent: Secondary | ICD-10-CM

## 2017-04-11 DIAGNOSIS — Z952 Presence of prosthetic heart valve: Secondary | ICD-10-CM

## 2017-04-11 DIAGNOSIS — N400 Enlarged prostate without lower urinary tract symptoms: Secondary | ICD-10-CM | POA: Diagnosis not present

## 2017-04-11 DIAGNOSIS — D72829 Elevated white blood cell count, unspecified: Secondary | ICD-10-CM | POA: Diagnosis not present

## 2017-04-11 HISTORY — DX: Enterocolitis due to Clostridium difficile, not specified as recurrent: A04.72

## 2017-04-11 LAB — CBG MONITORING, ED: Glucose-Capillary: 170 mg/dL — ABNORMAL HIGH (ref 65–99)

## 2017-04-11 LAB — HEPATIC FUNCTION PANEL
ALBUMIN: 3.2 g/dL — AB (ref 3.5–5.0)
ALK PHOS: 45 U/L (ref 38–126)
ALT: 13 U/L — ABNORMAL LOW (ref 17–63)
AST: 22 U/L (ref 15–41)
BILIRUBIN DIRECT: 0.1 mg/dL (ref 0.1–0.5)
BILIRUBIN INDIRECT: 0.4 mg/dL (ref 0.3–0.9)
BILIRUBIN TOTAL: 0.5 mg/dL (ref 0.3–1.2)
TOTAL PROTEIN: 7.2 g/dL (ref 6.5–8.1)

## 2017-04-11 LAB — CBC
HCT: 32.1 % — ABNORMAL LOW (ref 39.0–52.0)
Hemoglobin: 11.1 g/dL — ABNORMAL LOW (ref 13.0–17.0)
MCH: 29.3 pg (ref 26.0–34.0)
MCHC: 34.6 g/dL (ref 30.0–36.0)
MCV: 84.7 fL (ref 78.0–100.0)
PLATELETS: 237 10*3/uL (ref 150–400)
RBC: 3.79 MIL/uL — ABNORMAL LOW (ref 4.22–5.81)
RDW: 14.5 % (ref 11.5–15.5)
WBC: 14.5 10*3/uL — ABNORMAL HIGH (ref 4.0–10.5)

## 2017-04-11 LAB — BRAIN NATRIURETIC PEPTIDE: B NATRIURETIC PEPTIDE 5: 829.6 pg/mL — AB (ref 0.0–100.0)

## 2017-04-11 LAB — BASIC METABOLIC PANEL
ANION GAP: 12 (ref 5–15)
BUN: 20 mg/dL (ref 6–20)
CALCIUM: 9.2 mg/dL (ref 8.9–10.3)
CO2: 24 mmol/L (ref 22–32)
Chloride: 93 mmol/L — ABNORMAL LOW (ref 101–111)
Creatinine, Ser: 1.1 mg/dL (ref 0.61–1.24)
GFR calc Af Amer: >60 mL/min (ref >60)
GFR, EST NON AFRICAN AMERICAN: 57 mL/min — AB (ref >60)
GLUCOSE: 148 mg/dL — AB (ref 65–99)
POTASSIUM: 4.7 mmol/L (ref 3.5–5.1)
SODIUM: 129 mmol/L — AB (ref 135–145)

## 2017-04-11 LAB — URINALYSIS, ROUTINE W REFLEX MICROSCOPIC
BILIRUBIN URINE: NEGATIVE
GLUCOSE, UA: NEGATIVE mg/dL
HGB URINE DIPSTICK: NEGATIVE
KETONES UR: NEGATIVE mg/dL
Nitrite: NEGATIVE
PH: 6.5 (ref 5.0–8.0)
PROTEIN: NEGATIVE mg/dL
Specific Gravity, Urine: 1.005 (ref 1.005–1.030)

## 2017-04-11 LAB — I-STAT CG4 LACTIC ACID, ED
Lactic Acid, Venous: 4.44 mmol/L (ref 0.5–1.9)
Lactic Acid, Venous: 3.12 mmol/L (ref 0.5–1.9)

## 2017-04-11 LAB — URINALYSIS, MICROSCOPIC (REFLEX): RBC / HPF: NONE SEEN RBC/hpf (ref 0–5)

## 2017-04-11 LAB — TROPONIN I: TROPONIN I: 0.06 ng/mL — AB (ref <0.03)

## 2017-04-11 LAB — MAGNESIUM: MAGNESIUM: 1.6 mg/dL — AB (ref 1.7–2.4)

## 2017-04-11 MED ORDER — LIDOCAINE HCL 2 % EX GEL
CUTANEOUS | Status: AC
  Filled 2017-04-11: qty 20

## 2017-04-11 MED ORDER — PRAVASTATIN SODIUM 20 MG PO TABS
10.0000 mg | ORAL_TABLET | Freq: Every day | ORAL | Status: DC
  Administered 2017-04-12 – 2017-04-14 (×3): 10 mg via ORAL
  Filled 2017-04-11 (×3): qty 1

## 2017-04-11 MED ORDER — METOPROLOL TARTRATE 12.5 MG HALF TABLET
12.5000 mg | ORAL_TABLET | Freq: Two times a day (BID) | ORAL | Status: DC
  Administered 2017-04-12 (×2): 12.5 mg via ORAL
  Filled 2017-04-11 (×3): qty 1

## 2017-04-11 MED ORDER — METOPROLOL TARTRATE 5 MG/5ML IV SOLN
5.0000 mg | INTRAVENOUS | Status: DC | PRN
  Administered 2017-04-11: 5 mg via INTRAVENOUS
  Filled 2017-04-11: qty 5

## 2017-04-11 MED ORDER — AMIODARONE HCL IN DEXTROSE 360-4.14 MG/200ML-% IV SOLN
30.0000 mg/h | INTRAVENOUS | Status: DC
  Administered 2017-04-12 – 2017-04-13 (×3): 30 mg/h via INTRAVENOUS
  Filled 2017-04-11 (×7): qty 200

## 2017-04-11 MED ORDER — SODIUM CHLORIDE 0.9 % IV SOLN: 250.0000 mL | INTRAVENOUS | Status: DC | PRN

## 2017-04-11 MED ORDER — INSULIN ASPART 100 UNIT/ML ~~LOC~~ SOLN
2.0000 [IU] | SUBCUTANEOUS | Status: DC
Start: 2017-04-12 — End: 2017-04-13
  Administered 2017-04-12 (×2): 6 [IU] via SUBCUTANEOUS
  Administered 2017-04-12 (×3): 4 [IU] via SUBCUTANEOUS
  Administered 2017-04-13 (×3): 2 [IU] via SUBCUTANEOUS
  Administered 2017-04-13: 4 [IU] via SUBCUTANEOUS

## 2017-04-11 MED ORDER — LEVOTHYROXINE SODIUM 50 MCG PO TABS
50.0000 ug | ORAL_TABLET | Freq: Every day | ORAL | Status: DC
  Administered 2017-04-12 – 2017-04-16 (×5): 50 ug via ORAL
  Filled 2017-04-11 (×5): qty 1

## 2017-04-11 MED ORDER — AMIODARONE LOAD VIA INFUSION
150.0000 mg | Freq: Once | INTRAVENOUS | Status: AC
  Administered 2017-04-11: 150 mg via INTRAVENOUS
  Filled 2017-04-11: qty 83.34

## 2017-04-11 MED ORDER — FLUCONAZOLE 100 MG PO TABS
100.0000 mg | ORAL_TABLET | Freq: Every day | ORAL | Status: DC
  Administered 2017-04-12 – 2017-04-16 (×5): 100 mg via ORAL
  Filled 2017-04-11 (×5): qty 1

## 2017-04-11 MED ORDER — SODIUM CHLORIDE 0.9 % IV BOLUS (SEPSIS)
1000.0000 mL | Freq: Once | INTRAVENOUS | Status: AC
  Administered 2017-04-11: 1000 mL via INTRAVENOUS

## 2017-04-11 MED ORDER — AMIODARONE IV BOLUS ONLY 150 MG/100ML
150.0000 mg | Freq: Once | INTRAVENOUS | Status: DC
  Filled 2017-04-11: qty 100

## 2017-04-11 MED ORDER — SODIUM CHLORIDE 0.9 % IV BOLUS (SEPSIS)
500.0000 mL | Freq: Once | INTRAVENOUS | Status: AC
  Administered 2017-04-11: 500 mL via INTRAVENOUS

## 2017-04-11 MED ORDER — ADENOSINE 6 MG/2ML IV SOLN
6.0000 mg | Freq: Once | INTRAVENOUS | Status: AC
  Administered 2017-04-11: 6 mg via INTRAVENOUS
  Filled 2017-04-11: qty 2

## 2017-04-11 MED ORDER — PIPERACILLIN-TAZOBACTAM 3.375 G IVPB
3.3750 g | Freq: Three times a day (TID) | INTRAVENOUS | Status: DC
  Administered 2017-04-11 – 2017-04-14 (×8): 3.375 g via INTRAVENOUS
  Filled 2017-04-11 (×9): qty 50

## 2017-04-11 MED ORDER — PIPERACILLIN-TAZOBACTAM 3.375 G IVPB 30 MIN
3.3750 g | Freq: Once | INTRAVENOUS | Status: AC
  Administered 2017-04-11: 3.375 g via INTRAVENOUS
  Filled 2017-04-11 (×2): qty 50

## 2017-04-11 MED ORDER — VANCOMYCIN HCL IN DEXTROSE 750-5 MG/150ML-% IV SOLN: 750.0000 mg | INTRAVENOUS | Status: DC

## 2017-04-11 MED ORDER — AMIODARONE HCL IN DEXTROSE 360-4.14 MG/200ML-% IV SOLN
60.0000 mg/h | INTRAVENOUS | Status: AC
  Administered 2017-04-11: 60 mg/h via INTRAVENOUS
  Filled 2017-04-11 (×2): qty 200

## 2017-04-11 MED ORDER — LIDOCAINE HCL 2 % EX GEL: 1.0000 "application " | Freq: Once | CUTANEOUS | Status: DC

## 2017-04-11 MED ORDER — VANCOMYCIN HCL IN DEXTROSE 1-5 GM/200ML-% IV SOLN
1000.0000 mg | Freq: Once | INTRAVENOUS | Status: AC
  Administered 2017-04-11: 1000 mg via INTRAVENOUS
  Filled 2017-04-11: qty 200

## 2017-04-11 MED ORDER — SODIUM CHLORIDE 0.9 % IV BOLUS (SEPSIS): 250.0000 mL | Freq: Once | INTRAVENOUS | Status: DC

## 2017-04-11 MED ORDER — ONDANSETRON HCL 4 MG/2ML IJ SOLN: 4.0000 mg | Freq: Four times a day (QID) | INTRAMUSCULAR | Status: DC | PRN

## 2017-04-11 NOTE — ED Notes (Signed)
EDP at the bedside for cardioversion

## 2017-04-11 NOTE — Progress Notes (Signed)
Pharmacy Antibiotic Note  Gerald Holt. is a 83 y.o. male admitted on 04/11/2017 with sepsis.  Pharmacy has been consulted for vancomycin and zosyn dosing. Pt is afebrile but WBC is elevated at 14.5 SCr is WNL and lactic acid is elevated at 4.44.   Plan: Zosyn 3.375gm IV Q8H (4 hr inf) Vanc 1gm IV x 1 then 750mg  IV Q24H F/u renal fxn, C&S, clinical status and trough at SS  Height: 5\' 6"  (167.6 cm) Weight: 164 lb (74.4 kg) IBW/kg (Calculated) : 63.8  Temp (24hrs), Avg:98.9 F (37.2 C), Min:98.9 F (37.2 C), Max:98.9 F (37.2 C)   Recent Labs Lab 04/11/17 1540 04/11/17 1614  WBC 14.5*  --   CREATININE 1.10  --   LATICACIDVEN  --  4.44*    Estimated Creatinine Clearance: 39.5 mL/min (by C-G formula based on SCr of 1.1 mg/dL).    Allergies  Allergen Reactions  . Flomax [Tamsulosin Hcl] Other (See Comments)    Hypotension  . Shellfish Allergy Anaphylaxis  . Aspirin-Dipyridamole Er Other (See Comments)    Headaches   . Other Other (See Comments)    Blood Thinner given in Rehab gave H/As - Not Coumadin/Headaches      Antimicrobials this admission: Vanc 7/20>> Zosyn 7/20>>  Dose adjustments this admission: N/A  Microbiology results: Pending  Thank you for allowing pharmacy to be a part of this patient's care.  Gerald Holt, Rande Lawman 04/11/2017 4:46 PM

## 2017-04-11 NOTE — ED Notes (Signed)
Patient alert and speaking in full sentences without difficulty.  In  NAD.

## 2017-04-11 NOTE — ED Triage Notes (Signed)
Pt's wife states pt had chest pain on Wednesday. Which went away and returned today. Pt denies pain at present. Hx of afib. Pt is being treated for c-diff

## 2017-04-11 NOTE — ED Notes (Signed)
ED Provider at bedside. 

## 2017-04-11 NOTE — ED Notes (Signed)
Per critical care transport-intensivist request adenosine given before transport.  EDP made aware.  Pads placed on patient.  Code cart at bedside.

## 2017-04-11 NOTE — ED Provider Notes (Signed)
[facility] DEPT Provider Note   CSN: [id number] Arrival date & time: 04/11/17  1530     History   Chief Complaint Chief Complaint  Patient presents with  . Chest Pain    HPI Gerald Holt. is a 83 y.o. male.  HPI  Pt presenting due to generalized weakness.  Wife and caretaker at bedside state that over the past 2 days he has been more weak- not able to use his walker, falling asleep more frequently.  Temp of 99.6 at home.  He was re-started on po vanc for cdif earlier this week- had been treated recently as well.  He began to have diarrhea earlier this week- no further diarrhea since starting vanc.  Today he c/o chest tightness- denies chest tightness at time of ED evaluation.  Wife states she does in and out cath 4 times daily and he has been having less urine output.  There are no other associated systemic symptoms, there are no other alleviating or modifying factors.   Past Medical History:  Diagnosis Date  . A-fib ([place])   . C. difficile diarrhea   . Cataracts, bilateral   . Chronic dermatitis   . Diabetes type 2, controlled ([place]) 2000  . History of chicken pox   . Mumps    Adult  . Retinopathy   . Shingles   . Stroke [facility]) Nov 2011  . Undescended testicle, unilateral    Right  . Whooping cough     Patient Active Problem List   Diagnosis Date Noted  . Elevated troponin   . Sepsis due to other etiology ([place]) 04/11/2017  . Atrial fibrillation with rapid ventricular response ([place]) 04/11/2017  . Lactic acidosis 04/11/2017  . Atrial flutter with rapid ventricular response ([place]) 04/11/2017  . C. difficile enteritis 04/02/2017  . Leukocytosis 03/23/2017  . Essential hypertension 03/07/2017  . Nausea & vomiting 02/18/2017  . Weakness generalized 02/18/2017  . Weakness 02/18/2017  . Intermittent self-catheterization of bladder 11/22/2016  . Enlarged prostate 11/22/2016  . Urinary retention 10/31/2016  . Diabetes mellitus with complication ([place])   .  Renal insufficiency 10/25/2016  . Encounter for therapeutic drug monitoring 10/20/2016  . Hx: UTI (urinary tract infection) 10/20/2016  . Hypothyroidism 09/15/2016  . Gastroesophageal reflux disease without esophagitis 07/09/2015  . Falls 06/22/2015  . Diabetes mellitus type II, uncontrolled ([place]) 05/29/2015  . Paroxysmal atrial fibrillation ([place]) 05/29/2015  . HLD (hyperlipidemia) 05/29/2015  . Hx of completed stroke 05/29/2015    Past Surgical History:  Procedure Laterality Date  . CARDIAC VALVE REPLACEMENT    . PRE-MALIGNANT / BENIGN SKIN LESION EXCISION    . TESTICLE SURGERY     Right, undescended  . TOOTH EXTRACTION         Home Medications    Prior to Admission medications   Medication Sig Start Date End Date Taking? Authorizing Provider  aspirin 81 MG tablet Take 1 tablet (81 mg total) by mouth daily. 06/22/15  Yes Jerline Pain, MD  Black Elderberry,Berry-Flower, 575 MG CAPS Take 1 capsule by mouth 3 (three) times daily with meals.   Yes [provider]  Calcium Carbonate Antacid (ALKA-SELTZER ANTACID PO) Take 2 tablets by mouth daily as needed (for indegestion).    Yes [provider]  Cranberry 1000 MG CAPS Take 2 capsules by mouth 2 (two) times daily.    Yes [provider]  cyanocobalamin (,VITAMIN B-12,) 1000 MCG/ML injection Inject 1,000 mcg into the muscle every 30 (thirty) days. 03/31/17  Yes [provider]  Digestive Enzyme CAPS Take 1 capsule by mouth 3 (three) times daily with meals. 1 daily with every meal    Yes [provider]  docusate sodium (COLACE) 100 MG capsule Take 200 mg by mouth daily as needed for mild constipation.    Yes [provider]  Eyelid Cleansers (STERILID EX) Apply 1 application topically See admin instructions. Eye Wash: Once Daily    Yes [provider]  fluconazole (DIFLUCAN) 100 MG tablet Take 100 mg by mouth daily.   Yes [provider]  glucose blood test strip  One Touch Verio strips Use as instructed to check fasting sugars once daily .Dx:E11.9 11/20/16  Yes [name], PA-C  levothyroxine (SYNTHROID, LEVOTHROID) 50 MCG tablet Take 1 tablet (50 mcg total) by mouth daily. 04/09/16  Yes [name], PA-C  lovastatin (MEVACOR) 10 MG tablet TAKE 1 TABLET AT BEDTIME Patient taking differently: 5MG  BY MOUTH DAILY AT BEDTIME 02/27/17  Yes [name], PA-C  metFORMIN (GLUCOPHAGE-XR) 500 MG 24 hr tablet TAKE 2 TABLETS WITH BREAKFAST AND AT BEDTIME 02/27/17  Yes [name], PA-C  metoprolol tartrate (LOPRESSOR) 25 MG tablet Take 0.5 tablets (12.5 mg total) by mouth 2 (two) times daily. 04/09/16  Yes [name], PA-C  Miconazole Nitrate (TRIPLE PASTE AF) 2 % OINT Apply 1 application topically daily as needed (jock itch).    Yes [provider]  Omega-3 Fatty Acids (SB OMEGA-3 FISH OIL) 1000 MG CAPS Take 1,000 mg by mouth 2 (two) times daily. Reported on 10/06/2015   Yes [provider]  OVER THE COUNTER MEDICATION Take 1 capsule by mouth 2 (two) times daily. OTC Focus Select   Yes [provider]  vancomycin (VANCOCIN) 50 mg/mL oral solution Take 2.5 mLs by mouth every 6 (six) hours. 04/09/17 04/29/17 Yes [provider]    Family History Family History  Problem Relation Age of Onset  . Pneumonia Mother 36       Deceased  . GI Bleed Father 43       Deceased - Ulcers  . Diabetes Cousin   . Diabetes Brother   . Cancer Paternal Aunt     Social History Social History  Substance Use Topics  . Smoking status: Former Research scientist (life sciences)  . Smokeless tobacco: Never Used     Comment: Hasn't smoked since age 55  . Alcohol use No     Allergies   Flomax [tamsulosin hcl]; Shellfish allergy; Aspirin-dipyridamole er; and Other   Review of Systems Review of Systems  ROS reviewed and all otherwise negative except for mentioned in HPI   Physical Exam Updated Vital Signs BP 127/88 (BP Location: Left Arm)    Pulse 86   Temp 98.2 F (36.8 C) (Oral)   Resp 16   Ht 5\' 6"  (1.676 m)   Wt 73.5 kg (162 lb 0.6 oz)   SpO2 92%   BMI 26.15 kg/m  Vitals reviewed Physical Exam Physical Examination: General appearance - alert, tired appearing, and in no distress Mental status - alert, oriented to person, place, and time Eyes - no conjunctival injection no scleral icterus Mouth - mucous membranes moist, pharynx normal without lesions Neck - supple, no significant adenopathy Chest - clear to auscultation, no wheezes, rales or rhonchi, symmetric air entry Heart - normal rate, regular rhythm, normal S1, S2, no murmurs, rubs, clicks or gallops Abdomen - soft, nontender, nondistended, no masses or organomegaly Neurological - alert, oriented, normal speech,  strength 5/5 in extremities x 4, sensation intact Extremities - peripheral pulses normal, no pedal edema, no clubbing or cyanosis Skin - normal coloration and turgor, no rashes  ED Treatments / Results  Labs (all labs ordered are listed, but only abnormal results are displayed) Labs Reviewed  BASIC METABOLIC PANEL - Abnormal; Notable for the following:       Result Value   Sodium 129 (*)    Chloride 93 (*)    Glucose, Bld 148 (*)    GFR calc non Af Amer 57 (*)    All other components within normal limits  MAGNESIUM - Abnormal; Notable for the following:    Magnesium 1.6 (*)    All other components within normal limits  CBC - Abnormal; Notable for the following:    WBC 14.5 (*)    RBC 3.79 (*)    Hemoglobin 11.1 (*)    HCT 32.1 (*)    All other components within normal limits  BRAIN NATRIURETIC PEPTIDE - Abnormal; Notable for the following:    B Natriuretic Peptide 829.6 (*)    All other components within normal limits  TROPONIN I - Abnormal; Notable for the following:    Troponin I 0.06 (*)    All other components within normal limits  URINALYSIS, ROUTINE W REFLEX MICROSCOPIC - Abnormal; Notable for the following:    Leukocytes, UA  MODERATE (*)    All other components within normal limits  HEPATIC FUNCTION PANEL - Abnormal; Notable for the following:    Albumin 3.2 (*)    ALT 13 (*)    All other components within normal limits  URINALYSIS, MICROSCOPIC (REFLEX) - Abnormal; Notable for the following:    Bacteria, UA FEW (*)    Squamous Epithelial / LPF 0-5 (*)    All other components within normal limits  BASIC METABOLIC PANEL - Abnormal; Notable for the following:    Potassium 3.4 (*)    CO2 20 (*)    Glucose, Bld 62 (*)    Creatinine, Ser 1.42 (*)    Calcium 8.3 (*)    GFR calc non Af Amer 42 (*)    GFR calc Af Amer 48 (*)    All other components within normal limits  PHOSPHORUS - Abnormal; Notable for the following:    Phosphorus 1.9 (*)    All other components within normal limits  LACTIC ACID, PLASMA - Abnormal; Notable for the following:    Lactic Acid, Venous 2.0 (*)    All other components within normal limits  TROPONIN I - Abnormal; Notable for the following:    Troponin I 0.06 (*)    All other components within normal limits  GLUCOSE, CAPILLARY - Abnormal; Notable for the following:    Glucose-Capillary 182 (*)    All other components within normal limits  GLUCOSE, CAPILLARY - Abnormal; Notable for the following:    Glucose-Capillary 198 (*)    All other components within normal limits  GLUCOSE, CAPILLARY - Abnormal; Notable for the following:    Glucose-Capillary 175 (*)    All other components within normal limits  CBC - Abnormal; Notable for the following:    RBC 3.71 (*)    Hemoglobin 10.3 (*)    HCT 31.3 (*)    All other components within normal limits  GLUCOSE, CAPILLARY - Abnormal; Notable for the following:    Glucose-Capillary 187 (*)    All other components within normal limits  GLUCOSE, CAPILLARY - Abnormal; Notable for the following:  Glucose-Capillary 247 (*)    All other components within normal limits  GLUCOSE, CAPILLARY - Abnormal; Notable for the following:     Glucose-Capillary 205 (*)    All other components within normal limits  GLUCOSE, CAPILLARY - Abnormal; Notable for the following:    Glucose-Capillary 167 (*)    All other components within normal limits  GLUCOSE, CAPILLARY - Abnormal; Notable for the following:    Glucose-Capillary 154 (*)    All other components within normal limits  I-STAT CG4 LACTIC ACID, ED - Abnormal; Notable for the following:    Lactic Acid, Venous 4.44 (*)    All other components within normal limits  I-STAT CG4 LACTIC ACID, ED - Abnormal; Notable for the following:    Lactic Acid, Venous 3.12 (*)    All other components within normal limits  CBG MONITORING, ED - Abnormal; Notable for the following:    Glucose-Capillary 170 (*)    All other components within normal limits  URINE CULTURE  CULTURE, BLOOD (ROUTINE X 2)  CULTURE, BLOOD (ROUTINE X 2)  C DIFFICILE QUICK SCREEN W PCR REFLEX  MAGNESIUM  PROTIME-INR  OSMOLALITY, URINE  SODIUM, URINE, RANDOM  OSMOLALITY  PROCALCITONIN  PROCALCITONIN  CBC  BASIC METABOLIC PANEL  MAGNESIUM  PHOSPHORUS  I-STAT CG4 LACTIC ACID, ED    EKG  EKG Interpretation None       Radiology Dg Chest 2 View  Result Date: 04/11/2017 CLINICAL DATA:  Initial evaluation for acute chest pain, shortness of breath. EXAM: CHEST  2 VIEW COMPARISON:  Prior radiograph from 03/07/2017. FINDINGS: Cardiomegaly, similar to previous. Mediastinal silhouette normal. Aortic atherosclerosis. Lungs are hypoinflated. Patchy and linear bibasilar opacities favored to reflect atelectasis/bronchovascular crowding. There is diffusely increased pulmonary vascular congestion with interstitial prominence, suggesting mild pulmonary interstitial edema. No other definite focal infiltrates. No significant pleural effusion. No pneumothorax. No acute osseus abnormality. IMPRESSION: 1. Cardiomegaly with diffuse pulmonary vascular congestion and interstitial prominence, suggesting mild pulmonary interstitial  congestion/edema. 2. Shallow lung inflation with superimposed bibasilar atelectasis/bronchovascular crowding. 3. Aortic atherosclerosis. Electronically Signed   By: Jeannine Boga M.D.   On: 04/11/2017 16:23    Procedures Procedures (including critical care time)  Medications Ordered in ED Medications  piperacillin-tazobactam (ZOSYN) IVPB 3.375 g (3.375 g Intravenous New Bag/Given 04/12/17 2151)  amiodarone (NEXTERONE) 1.8 mg/mL load via infusion 150 mg (150 mg Intravenous Bolus from Bag 04/11/17 2215)    Followed by  amiodarone (NEXTERONE PREMIX) 360-4.14 MG/200ML-% (1.8 mg/mL) IV infusion (0 mg/hr Intravenous Stopped 04/12/17 0058)    Followed by  amiodarone (NEXTERONE PREMIX) 360-4.14 MG/200ML-% (1.8 mg/mL) IV infusion (30 mg/hr Intravenous Rate/Dose Verify 04/12/17 1500)  fluconazole (DIFLUCAN) tablet 100 mg (100 mg Oral Given 04/12/17 1020)  pravastatin (PRAVACHOL) tablet 10 mg (10 mg Oral Given 04/12/17 1802)  levothyroxine (SYNTHROID, LEVOTHROID) tablet 50 mcg (50 mcg Oral Given 04/12/17 0840)  0.9 %  sodium chloride infusion (not administered)  ondansetron (ZOFRAN) injection 4 mg (not administered)  insulin aspart (novoLOG) injection 2-6 Units (4 Units Subcutaneous Given 04/13/17 0002)  vancomycin (VANCOCIN) 50 mg/mL oral solution 250 mg (250 mg Oral Given 04/13/17 0001)  metoprolol tartrate (LOPRESSOR) injection 2.5-5 mg (2.5 mg Intravenous Given 04/12/17 2102)  heparin injection 5,000 Units (5,000 Units Subcutaneous Given 04/12/17 2142)  metoprolol tartrate (LOPRESSOR) tablet 25 mg (25 mg Oral Given 04/12/17 2141)  diltiazem (CARDIZEM CD) 24 hr capsule 240 mg (240 mg Oral Given 04/12/17 1519)  digoxin (LANOXIN) tablet 0.125 mg (0.125 mg Oral Given 04/12/17  1519)  sodium chloride 0.9 % bolus 500 mL (0 mLs Intravenous Stopped 04/11/17 1803)  sodium chloride 0.9 % bolus 1,000 mL (0 mLs Intravenous Stopped 04/11/17 1803)    And  sodium chloride 0.9 % bolus 1,000 mL (0 mLs Intravenous Stopped  04/11/17 1803)  piperacillin-tazobactam (ZOSYN) IVPB 3.375 g (0 g Intravenous Stopped 04/11/17 1701)  vancomycin (VANCOCIN) IVPB 1000 mg/200 mL premix (0 mg Intravenous Stopped 04/11/17 1848)  adenosine (ADENOCARD) 6 MG/2ML injection 6 mg (6 mg Intravenous Given 04/11/17 2056)  magnesium sulfate IVPB 2 g 50 mL (0 g Intravenous Stopped 04/12/17 0157)  potassium phosphate 30 mmol in dextrose 5 % 500 mL infusion (0 mmol Intravenous Stopped 04/12/17 1145)  magnesium sulfate IVPB 2 g 50 mL (0 g Intravenous Stopped 04/12/17 0607)  metoprolol tartrate (LOPRESSOR) 5 MG/5ML injection (  Duplicate 5/45/62 5638)  amiodarone (NEXTERONE) IV bolus only 150 mg/100 mL (150 mg Intravenous Bolus from Bag 04/12/17 1030)   CRITICAL CARE Performed by: Threasa Beards Total critical care time: 50  minutes Critical care time was exclusive of separately billable procedures and treating other patients. Critical care was necessary to treat or prevent imminent or life-threatening deterioration. Critical care was time spent personally by me on the following activities: development of treatment plan with patient and/or surrogate as well as nursing, discussions with consultants, evaluation of patient's response to treatment, examination of patient, obtaining history from patient or surrogate, ordering and performing treatments and interventions, ordering and review of laboratory studies, ordering and review of radiographic studies, pulse oximetry and re-evaluation of patient's condition.  ED ECG REPORT   Date: 04/11/2017  Rate: 152  Rhythm: wide complex tachycardia  QRS Axis: normal  Intervals: indeterminate  ST/T Wave abnormalities: indeterminate  Conduction Disutrbances:right bundle branch block  Narrative Interpretation:   Old EKG Reviewed: none available  EKG link not available in epic for interpretation into muse  Initial Impression / Assessment and Plan / ED Course  I have reviewed the triage vital signs and the  nursing notes.  Pertinent labs & imaging results that were available during my care of the patient were reviewed by me and considered in my medical decision making (see chart for details).    5:18 PM paging critical care- pt has elevated lactate- did have temp 99 yesterday, HR is 150s- however has hx of afib- rate is regular- could be flutter versus sinus tachcyardia.  Lactate is > 4, symptoms could be related to sepsis so code sepsis ordered- however pt does have fluid on CXR and BNP is elevated- will discuss case with ICU as this patient will be difficult in terms of fluid management.  Broad spectrum abx ordered.    6:01 PM d/w Dr. Corinna Lines, critical care, pt accepted to ICU bed to Dr. Elsworth Soho service.    Adenosine administered 6mg - HR slowed and showed wide complex regular bradycardic rhythm on strip.  Unclear if there are p waves present.  HR immediately back up to 150s after adenosine- pt transferred via carelink.  Amiodarone had been ordered as well.     Final Clinical Impressions(s) / ED Diagnoses   Final diagnoses:  Tachycardia  Sepsis due to other etiology ([place])  C. difficile enteritis  Chest pain, unspecified type  Elevated troponin    New Prescriptions Current Discharge Medication List       Alfonzo Beers, MD 04/13/17 0004

## 2017-04-11 NOTE — Progress Notes (Signed)
[place] [place] Progress Note Patient Name: Gerald Holt. DOB: Sep 29, 1923 MRN: [id number]   Date of Service  04/11/2017  HPI/Events of Note  Patient admitted to [facility] service with AF/RVR.  Alert, HD stable.  On PO vanc for C Diff.  Did not respond to one time dose of adenosine.    eICU Interventions  Amio load and gtt. To be seen by PCCM     Intervention Category Major Interventions: Arrhythmia - evaluation and management  DETERDING,ELIZABETH 04/11/2017, 9:59 PM

## 2017-04-11 NOTE — ED Notes (Signed)
Pt on monitor 

## 2017-04-11 NOTE — ED Notes (Signed)
Unsuccessful cardioversion with adenosine.  Per EDP only 1 dose of adenosine to be given.

## 2017-04-12 ENCOUNTER — Inpatient Hospital Stay (HOSPITAL_COMMUNITY): Payer: Medicare Other

## 2017-04-12 ENCOUNTER — Other Ambulatory Visit: Payer: Self-pay

## 2017-04-12 ENCOUNTER — Other Ambulatory Visit (HOSPITAL_COMMUNITY): Payer: Medicare Other

## 2017-04-12 DIAGNOSIS — I4891 Unspecified atrial fibrillation: Secondary | ICD-10-CM

## 2017-04-12 DIAGNOSIS — R7989 Other specified abnormal findings of blood chemistry: Secondary | ICD-10-CM

## 2017-04-12 DIAGNOSIS — A4189 Other specified sepsis: Secondary | ICD-10-CM

## 2017-04-12 DIAGNOSIS — A0472 Enterocolitis due to Clostridium difficile, not specified as recurrent: Secondary | ICD-10-CM

## 2017-04-12 DIAGNOSIS — R778 Other specified abnormalities of plasma proteins: Secondary | ICD-10-CM

## 2017-04-12 LAB — MAGNESIUM: MAGNESIUM: 1.7 mg/dL (ref 1.7–2.4)

## 2017-04-12 LAB — OSMOLALITY, URINE: Osmolality, Ur: 351 mOsm/kg (ref 300–900)

## 2017-04-12 LAB — BASIC METABOLIC PANEL
Anion gap: 10 (ref 5–15)
BUN: 19 mg/dL (ref 6–20)
CO2: 20 mmol/L — ABNORMAL LOW (ref 22–32)
CREATININE: 1.42 mg/dL — AB (ref 0.61–1.24)
Calcium: 8.3 mg/dL — ABNORMAL LOW (ref 8.9–10.3)
Chloride: 109 mmol/L (ref 101–111)
GFR calc Af Amer: 48 mL/min — ABNORMAL LOW (ref >60)
GFR, EST NON AFRICAN AMERICAN: 42 mL/min — AB (ref >60)
Glucose, Bld: 62 mg/dL — ABNORMAL LOW (ref 65–99)
POTASSIUM: 3.4 mmol/L — AB (ref 3.5–5.1)
SODIUM: 139 mmol/L (ref 135–145)

## 2017-04-12 LAB — CBC
HCT: 31.3 % — ABNORMAL LOW (ref 39.0–52.0)
HEMOGLOBIN: 10.3 g/dL — AB (ref 13.0–17.0)
MCH: 27.8 pg (ref 26.0–34.0)
MCHC: 32.9 g/dL (ref 30.0–36.0)
MCV: 84.4 fL (ref 78.0–100.0)
PLATELETS: 200 10*3/uL (ref 150–400)
RBC: 3.71 MIL/uL — AB (ref 4.22–5.81)
RDW: 14.4 % (ref 11.5–15.5)
WBC: 10.5 10*3/uL (ref 4.0–10.5)

## 2017-04-12 LAB — PROTIME-INR
INR: 1.01
Prothrombin Time: 13.3 seconds (ref 11.4–15.2)

## 2017-04-12 LAB — GLUCOSE, CAPILLARY
GLUCOSE-CAPILLARY: 154 mg/dL — AB (ref 65–99)
GLUCOSE-CAPILLARY: 167 mg/dL — AB (ref 65–99)
GLUCOSE-CAPILLARY: 175 mg/dL — AB (ref 65–99)
GLUCOSE-CAPILLARY: 182 mg/dL — AB (ref 65–99)
GLUCOSE-CAPILLARY: 187 mg/dL — AB (ref 65–99)
GLUCOSE-CAPILLARY: 198 mg/dL — AB (ref 65–99)
GLUCOSE-CAPILLARY: 205 mg/dL — AB (ref 65–99)
Glucose-Capillary: 247 mg/dL — ABNORMAL HIGH (ref 65–99)

## 2017-04-12 LAB — SODIUM, URINE, RANDOM: SODIUM UR: 98 mmol/L

## 2017-04-12 LAB — URINE CULTURE: CULTURE: NO GROWTH

## 2017-04-12 LAB — OSMOLALITY: OSMOLALITY: 289 mosm/kg (ref 275–295)

## 2017-04-12 LAB — LACTIC ACID, PLASMA: Lactic Acid, Venous: 2 mmol/L (ref 0.5–1.9)

## 2017-04-12 LAB — PHOSPHORUS: Phosphorus: 1.9 mg/dL — ABNORMAL LOW (ref 2.5–4.6)

## 2017-04-12 LAB — PROCALCITONIN: PROCALCITONIN: 0.12 ng/mL

## 2017-04-12 LAB — TROPONIN I: Troponin I: 0.06 ng/mL (ref <0.03)

## 2017-04-12 MED ORDER — HEPARIN SODIUM (PORCINE) 5000 UNIT/ML IJ SOLN
5000.0000 [IU] | Freq: Three times a day (TID) | INTRAMUSCULAR | Status: DC
  Administered 2017-04-12 – 2017-04-14 (×6): 5000 [IU] via SUBCUTANEOUS
  Filled 2017-04-12 (×8): qty 1

## 2017-04-12 MED ORDER — MAGNESIUM SULFATE 2 GM/50ML IV SOLN
2.0000 g | Freq: Once | INTRAVENOUS | Status: AC
  Administered 2017-04-12: 2 g via INTRAVENOUS
  Filled 2017-04-12: qty 50

## 2017-04-12 MED ORDER — METOPROLOL TARTRATE 5 MG/5ML IV SOLN
2.5000 mg | INTRAVENOUS | Status: DC | PRN
  Administered 2017-04-12: 2.5 mg via INTRAVENOUS
  Administered 2017-04-12: 5 mg via INTRAVENOUS
  Filled 2017-04-12 (×2): qty 5

## 2017-04-12 MED ORDER — POTASSIUM PHOSPHATES 15 MMOLE/5ML IV SOLN
30.0000 mmol | Freq: Once | INTRAVENOUS | Status: AC
  Administered 2017-04-12: 30 mmol via INTRAVENOUS
  Filled 2017-04-12: qty 10

## 2017-04-12 MED ORDER — METOPROLOL TARTRATE 25 MG PO TABS
25.0000 mg | ORAL_TABLET | Freq: Two times a day (BID) | ORAL | Status: DC
  Administered 2017-04-12 – 2017-04-14 (×4): 25 mg via ORAL
  Filled 2017-04-12 (×5): qty 1

## 2017-04-12 MED ORDER — DIGOXIN 125 MCG PO TABS
0.1250 mg | ORAL_TABLET | Freq: Every day | ORAL | Status: DC
  Administered 2017-04-12 – 2017-04-13 (×2): 0.125 mg via ORAL
  Filled 2017-04-12 (×2): qty 1

## 2017-04-12 MED ORDER — METOPROLOL TARTRATE 5 MG/5ML IV SOLN
INTRAVENOUS | Status: AC
  Filled 2017-04-12: qty 5

## 2017-04-12 MED ORDER — AMIODARONE IV BOLUS ONLY 150 MG/100ML
150.0000 mg | Freq: Once | INTRAVENOUS | Status: AC
  Administered 2017-04-12: 150 mg via INTRAVENOUS

## 2017-04-12 MED ORDER — DILTIAZEM HCL ER COATED BEADS 240 MG PO CP24
240.0000 mg | ORAL_CAPSULE | Freq: Every day | ORAL | Status: DC
  Administered 2017-04-12 – 2017-04-16 (×5): 240 mg via ORAL
  Filled 2017-04-12 (×5): qty 1

## 2017-04-12 MED ORDER — VANCOMYCIN 50 MG/ML ORAL SOLUTION
250.0000 mg | Freq: Four times a day (QID) | ORAL | Status: DC
  Administered 2017-04-12 – 2017-04-16 (×19): 250 mg via ORAL
  Filled 2017-04-12 (×22): qty 5

## 2017-04-12 NOTE — Progress Notes (Signed)
[place] Progress Note Patient Name: Gerald Holt. DOB: 1924-08-06 MRN: [id number]   Date of Service  04/12/2017  HPI/Events of Note  Hypokalemia, hypophos and moderately low mag  eICU Interventions  Potassium, phos, and mag replaced     Intervention Category Intermediate Interventions: Electrolyte abnormality - evaluation and management  DETERDING,ELIZABETH 04/12/2017, 4:56 AM

## 2017-04-12 NOTE — Progress Notes (Signed)
PULMONARY / CRITICAL CARE MEDICINE   Name: Gerald Holt. MRN: [id number] DOB: 09-May-1924    ADMISSION DATE:  04/11/2017  CHIEF COMPLAINT:  Afib RVR   HISTORY OF PRESENT ILLNESS:   Limited H&P from medical record.  Patient states he had a prior CVA and has trouble with memory since.   He does not know details prompting him to ED today.  Wife called at number on face sheet, no answer.  Hx per chart review included recent Tx for fungal uti in setting frequent self-cath for neurogenic bladder and Tx for c-diff diarrhea w/PO vanoc recurrent 2/2 abx for frequent uti.  In ed pt afib rvr and eelvated lactic acid and started on sepsis tx w/vanco and zosyn.  Transfer documents possibly show admin of adenosine based on rhythm strips.  Patient started on amiodarone infusion.    SUBj - denies CP, dyspnea HR 140 on monitor, regular    VITAL SIGNS: BP 119/88 (BP Location: Left Arm)   Pulse (!) 140   Temp 98.3 F (36.8 C) (Oral)   Resp (!) 22   Ht 5\' 6"  (1.676 m)   Wt 162 lb 0.6 oz (73.5 kg)   SpO2 95%   BMI 26.15 kg/m   HEMODYNAMICS:     VENTILATOR SETTINGS:n/a    INTAKE / OUTPUT: I/O last 3 completed shifts: In: 1168.3 [I.V.:183.3; Other:700; IV Piggyback:285] Out: 1300 [Urine:1300]   PHYSICAL EXAMINATION: General:  Elderly, acutely ill, no distress Neuro: alert, interactive, mae, no focal deficit HEENT:  [place], atnc, pink and moist mm Cardiovascular:  Tachy, regualr, no ccc, no murmur, cap refill <2seconds Lungs:  cta bilateral, good tv, no wheezing Abdomen:  Soft, ntnd, no hsm, +BS Musculoskeletal:  No red/wamr/swollen/tender, normal tone Skin:  Warm dry intact  LABS:  BMET  Recent Labs Lab 04/11/17 1540 04/12/17 0058  NA 129* 139  K 4.7 3.4*  CL 93* 109  CO2 24 20*  BUN 20 19  CREATININE 1.10 1.42*  GLUCOSE 148* 62*    Electrolytes  Recent Labs Lab 04/11/17 1540 04/12/17 0058  CALCIUM 9.2 8.3*  MG 1.6* 1.7  PHOS  --  1.9*    CBC  Recent  Labs Lab 04/11/17 1540 04/12/17 0655  WBC 14.5* 10.5  HGB 11.1* 10.3*  HCT 32.1* 31.3*  PLT 237 200    Coag's  Recent Labs Lab 04/12/17 0058  INR 1.01    Sepsis Markers  Recent Labs Lab 04/11/17 1614 04/11/17 1936 04/12/17 0048  LATICACIDVEN 4.44* 3.12* 2.0*    ABG No results for input(s): PHART, PCO2ART, PO2ART in the last 168 hours.  Liver Enzymes  Recent Labs Lab 04/11/17 1540  AST 22  ALT 13*  ALKPHOS 45  BILITOT 0.5  ALBUMIN 3.2*    Cardiac Enzymes  Recent Labs Lab 04/11/17 1540 04/12/17 0048  TROPONINI 0.06* 0.06*    Glucose  Recent Labs Lab 04/11/17 1945 04/11/17 2203 04/12/17 0019 04/12/17 0412 04/12/17 0804  GLUCAP 170* 182* 198* 175* 187*    Imaging Dg Chest 2 View  Result Date: 04/11/2017 CLINICAL DATA:  Initial evaluation for acute chest pain, shortness of breath. EXAM: CHEST  2 VIEW COMPARISON:  Prior radiograph from 03/07/2017. FINDINGS: Cardiomegaly, similar to previous. Mediastinal silhouette normal. Aortic atherosclerosis. Lungs are hypoinflated. Patchy and linear bibasilar opacities favored to reflect atelectasis/bronchovascular crowding. There is diffusely increased pulmonary vascular congestion with interstitial prominence, suggesting mild pulmonary interstitial edema. No other definite focal infiltrates. No significant pleural effusion. No pneumothorax. No acute  osseus abnormality. IMPRESSION: 1. Cardiomegaly with diffuse pulmonary vascular congestion and interstitial prominence, suggesting mild pulmonary interstitial congestion/edema. 2. Shallow lung inflation with superimposed bibasilar atelectasis/bronchovascular crowding. 3. Aortic atherosclerosis. Electronically Signed   By: Jeannine Boga M.D.   On: 04/11/2017 16:23     STUDIES:  EKG wide complex tachy rbbb (old) SVT vs Afib  CULTURES: BCX 7/20 >> UCX 7/20  >>  ANTIBIOTICS: vanco IV 7/20 >> vanco po 7/20 >> Zosyn 7/20 >> Fluconazole 7/20  >>  LINES/TUBES: Foley 7/20>>  DISCUSSION: Problems: Afib RVR vs SVT Hyponatremia C-diff diarrhea Lacitc acidosis R/o sepsis r/o metformin associated Hyponatremia  CKD stage II Hypomagnesemia Elevated troponin likely type 2 NSTEMI Leukocytosis Normocytic anemia liekly AOCD DM type 2, uncontrolled w/hyperglycemia unclear relation to CKD Possible UTI Pulmonary edema   ASSESSMENT / PLAN:  PULMONARY A:Acute pulmonary edema P:   Treat afib O2 PRN sats <92  CARDIOVASCULAR A:  AF- RVR - clarified by adenosine although rate looks regular on monitor , not on A/c since 2016 due to fall risk (reviewed outpt cards note - [facility]) Demand ischemia P:  amio gtt and BB Rpt IV lopressor 5 x1 - if does not work, rebolus amio & call cards tele   RENAL A:   AKI , hyponatremia Lactic acidosis  - resolving Hypokalemia P:   Replete K  GASTROINTESTINAL A:   C-diff, benign exm P:   Dc PPI  HEMATOLOGIC A:   Leukocytsosis. anemia P:  Monitor cbc  INFECTIOUS A:   Recurrent c-diff diarrhea R/o sepsis 2/2 cdiff and/or uti most likely P:   Dc Vanco, ct zosyn - obtain pct to decide if required C/w oupt fluconazole until cx back Recheck C diff - relapse was assumed due to recurrent diarrhea (by outpt ID doc)  ENDOCRINE A:   DM   P:   SSI  NEUROLOGIC A:   No pain or agitation P:   RASS goal: 0 CAM-ICU   FAMILY  - Updates: called wife noanswer   - Inter-disciplinary family meet or Palliative Care meeting due by:  day 7  DNR per medical record, will need to reconfirm  My cc time x 76m  Kara Mead MD. [facility]. [place] [facility] Pager [phone] If no response call [phone]     04/12/2017, 9:51 AM

## 2017-04-12 NOTE — Progress Notes (Signed)
CRITICAL VALUE ALERT  Critical Value:  Triponin 0.06 : consistent with previous labs ; lactic acid 2.0 : improved from 3.12  Date & Time Notied:  04/12/2017 : 02:23  Provider Notified: Dr Deterding : Gerald Holt  Orders Received/Actions taken: none

## 2017-04-12 NOTE — Consult Note (Signed)
CARDIOLOGY CONSULT NOTE  Patient ID: Gerald Holt. MRN: [id number] DOB/AGE: 06/07/1924 83 y.o.  Admit date: 04/11/2017 Referring Physician  Kara Mead, MD Primary Physician:  [name], PA-C Reason for Consultation  A. FIb with RVR  HPI: Gerald Holt.  is a 83 y.o. male  With Worsening dementia, history of recurrent UTI, C. difficile diarrhea being treated in the outpatient basis, admitted to the hospital with sepsis and was found to be in atrial flutter with rapid ventricular response with 2:1 conduction. He has underlying right bundle branch block that is chronic. I been consultative manage his arrhythmia. He was fluid resuscitated and there is pain improvement in his septic picture, aggressive fluids were not continued due to symptoms also suggestive of pulmonary edema.  Patient's Caregiver is at the bedside, states that over the past 2 days he has been having worsening generalized weakness. He was also recently noted to have mild low-grade temperature and 4 days ago clindamycin for recurrence of C. difficile diarrhea.  Patient otherwise has been doing well, walks with the help of a walker but yesterday she was extremely weak and was falling asleep very easily and had difficulty in taking medications and also had loss of appetite. They called PCP, who recommended them to go to the hospital for further evaluation.  Patient is very pleasant, was able to give me appropriate history including dates of atrial fibrillation and prior medical history. He has had multiple episodes of atrial fibrillation the past and was first diagnosed in 1984.  He has history of paroxysmal atrial fibrillation, has had stroke in 2011, however recently the Coumadin has been discontinued due to frequent episodes of fall. There's been no further falls over the past 2 years. He has been stable and has a very good caregiver that is taking care of him around-the-clock. He is a Radiographer, therapeutic and a Immunologist.  Past Medical History:  Diagnosis Date  . A-fib ([place])   . C. difficile diarrhea   . Cataracts, bilateral   . Chronic dermatitis   . Diabetes type 2, controlled ([place]) 2000  . History of chicken pox   . Mumps    Adult  . Retinopathy   . Shingles   . Stroke [facility]) Nov 2011  . Undescended testicle, unilateral    Right  . Whooping cough      Past Surgical History:  Procedure Laterality Date  . CARDIAC VALVE REPLACEMENT    . PRE-MALIGNANT / BENIGN SKIN LESION EXCISION    . TESTICLE SURGERY     Right, undescended  . TOOTH EXTRACTION       Family History  Problem Relation Age of Onset  . Pneumonia Mother 2       Deceased  . GI Bleed Father 27       Deceased - Ulcers  . Diabetes Cousin   . Diabetes Brother   . Cancer Paternal Aunt      Social History: Social History   Social History  . Marital status: Married    Spouse name: N/A  . Number of children: N/A  . Years of education: N/A   Occupational History  . Not on file.   Social History Main Topics  . Smoking status: Former Research scientist (life sciences)  . Smokeless tobacco: Never Used     Comment: Hasn't smoked since age 66  . Alcohol use No  . Drug use: No  . Sexual activity: Not on file   Other Topics Concern  . Not  on file   Social History Narrative   Lives with wife in a one story home.  Has 1 child.  Retired Armed forces technical officer.  Education: Oceanographer.       Prescriptions Prior to Admission  Medication Sig Dispense Refill Last Dose  . fluconazole (DIFLUCAN) 100 MG tablet Take 100 mg by mouth daily.     Marland Kitchen aspirin 81 MG tablet Take 1 tablet (81 mg total) by mouth daily.   Taking  . Black Elderberry,Berry-Flower, 575 MG CAPS Take 1 capsule by mouth 3 (three) times daily with meals.   Taking  . Calcium Carbonate Antacid (ALKA-SELTZER ANTACID PO) Take 2 tablets by mouth daily as needed (for indegestion).    Taking  . Cranberry 1000 MG CAPS Take 2 capsules by mouth daily.   Taking  . Digestive Enzyme CAPS Take by mouth. 1  daily with every meal   Taking  . docusate sodium (COLACE) 100 MG capsule Take 200 mg by mouth daily as needed for mild constipation.    Taking  . Eyelid Cleansers (STERILID EX) Apply 1 application topically See admin instructions. Eye Wash: Once Daily    Taking  . glucose blood test strip One Touch Verio strips Use as instructed to check fasting sugars once daily .Dx:E11.9 100 each 12 UNK at [facility]  . levothyroxine (SYNTHROID, LEVOTHROID) 50 MCG tablet Take 1 tablet (50 mcg total) by mouth daily. 90 tablet 3 Taking  . lovastatin (MEVACOR) 10 MG tablet TAKE 1 TABLET AT BEDTIME 90 tablet 1 Taking  . metFORMIN (GLUCOPHAGE-XR) 500 MG 24 hr tablet TAKE 2 TABLETS WITH BREAKFAST AND AT BEDTIME 360 tablet 1 Taking  . metoprolol tartrate (LOPRESSOR) 25 MG tablet Take 0.5 tablets (12.5 mg total) by mouth 2 (two) times daily. 180 tablet 1 Taking  . Miconazole Nitrate (TRIPLE PASTE AF) 2 % OINT Apply 1 application topically daily as needed (mouth care).    Taking  . Omega-3 Fatty Acids (SB OMEGA-3 FISH OIL) 1000 MG CAPS Take 1,000 mg by mouth daily. Reported on 10/06/2015   Taking  . OVER THE COUNTER MEDICATION Take 1 capsule by mouth 2 (two) times daily. OTC Focus Select   Taking  . triamcinolone cream (KENALOG) 0.1 % Apply 1 application topically daily as needed (for itching).    Taking   Review of Systems - Negative except Generalized weakness and chest pain that started yesterday, fatigue, no neurologic deficits, no recent weight changes, recent worsening diarrhea over the past 4 days, low-grade temperature over the past 4 days   Physical Exam: Blood pressure 104/82, pulse (!) 135, temperature 98.8 F (37.1 C), temperature source Oral, resp. rate (!) 22, height 5\' 6"  (1.676 m), weight 73.5 kg (162 lb 0.6 oz), SpO2 95 %.   General appearance: alert, cooperative, appears stated age and no distress Neck: no carotid bruit, no JVD, supple, symmetrical, trachea midline and thyroid not enlarged, symmetric, no  tenderness/mass/nodules Lungs: Very faint bilateral basal crackles present. Heart: Tachycardic, S1 and S2 appear to be normal, no murmur appreciated. Distant heart sounds. Abdomen: soft, non-tender; bowel sounds normal; no masses,  no organomegaly Extremities: extremities normal, atraumatic, no cyanosis or edema Pulses: Bilateral carotids normal, no bruit heard. Femoral pulses were normal. Popliteal pulses could not be felt due to SCD, pedal pulses are very faint bilaterally. Capillary refill normal. Neurologic: Grossly normal  Labs:  Lab Results  Component Value Date   WBC 10.5 04/12/2017   HGB 10.3 (L) 04/12/2017   HCT 31.3 (L) 04/12/2017  MCV 84.4 04/12/2017   PLT 200 04/12/2017    Recent Labs Lab 04/11/17 1540 04/12/17 0058  NA 129* 139  K 4.7 3.4*  CL 93* 109  CO2 24 20*  BUN 20 19  CREATININE 1.10 1.42*  CALCIUM 9.2 8.3*  PROT 7.2  --   BILITOT 0.5  --   ALKPHOS 45  --   ALT 13*  --   AST 22  --   GLUCOSE 148* 62*    Lipid Panel     Component Value Date/Time   CHOL 139 10/18/2016 1149   TRIG 161.0 (H) 10/18/2016 1149   HDL 32.90 (L) 10/18/2016 1149   CHOLHDL 4 10/18/2016 1149   VLDL 32.2 10/18/2016 1149   LDLCALC 73 10/18/2016 1149    BNP (last 3 results)  Recent Labs  04/11/17 1540  BNP 829.6*    HEMOGLOBIN A1C Lab Results  Component Value Date   HGBA1C 7.2 (H) 03/05/2017    Cardiac Panel (last 3 results)  Recent Labs  02/18/17 0447 03/07/17 1540 04/11/17 1540 04/12/17 0048  CKTOTAL 11*  --   --   --   TROPONINI <0.03 <0.03 0.06* 0.06*    Lab Results  Component Value Date   CKTOTAL 11 (L) 02/18/2017   TROPONINI 0.06 ([place]) 04/12/2017     TSH  Recent Labs  08/23/16 1322 02/18/17 0447 03/09/17 0758  TSH 2.92 1.197 3.741     Radiology: Dg Chest 2 View  Result Date: 04/11/2017 CLINICAL DATA:  Initial evaluation for acute chest pain, shortness of breath. EXAM: CHEST  2 VIEW COMPARISON:  Prior radiograph from 03/07/2017.  FINDINGS: Cardiomegaly, similar to previous. Mediastinal silhouette normal. Aortic atherosclerosis. Lungs are hypoinflated. Patchy and linear bibasilar opacities favored to reflect atelectasis/bronchovascular crowding. There is diffusely increased pulmonary vascular congestion with interstitial prominence, suggesting mild pulmonary interstitial edema. No other definite focal infiltrates. No significant pleural effusion. No pneumothorax. No acute osseus abnormality. IMPRESSION: 1. Cardiomegaly with diffuse pulmonary vascular congestion and interstitial prominence, suggesting mild pulmonary interstitial congestion/edema. 2. Shallow lung inflation with superimposed bibasilar atelectasis/bronchovascular crowding. 3. Aortic atherosclerosis. Electronically Signed   By: Jeannine Boga M.D.   On: 04/11/2017 16:23    Scheduled Meds: . fluconazole  100 mg Oral Daily  . heparin subcutaneous  5,000 Units Subcutaneous Q8H  . insulin aspart  2-6 Units Subcutaneous Q4H  . levothyroxine  50 mcg Oral QAC breakfast  . metoprolol tartrate  12.5 mg Oral BID  . pravastatin  10 mg Oral q1800  . vancomycin  250 mg Oral Q6H   Continuous Infusions: . sodium chloride    . amiodarone 30 mg/hr (04/12/17 0700)  . piperacillin-tazobactam (ZOSYN)  IV Stopped (04/12/17 1010)   PRN Meds:.sodium chloride, metoprolol tartrate, ondansetron (ZOFRAN) IV  CARDIAC STUDIES:  EKG: 04/11/17: Atrial flutter with 2:1 conduction at a rate of 150 bpm, underlying right bundle branch block, no evidence of ischemia.  Echo: Pending.  ASSESSMENT AND PLAN:  1. Paroxysmal atrial fibrillation, Symptomatic with worsening fatigue and chest pain.  CHA2DS2-VASCScore: Risk Score  6,  Yearly risk of stroke  9.8. Recommendation: ASA NO/Anticoagulation Yes (Not a candidate for anticoagulation due to fall risk) 2. Atrial flutter with 2:1 conduction at the rate of 150 bpm 3. Acute on chronic diastolic heart failure, positive cardiac markers due  to demand ischemia and/or CHF. 4. Hyperlipidemia 5. History of stroke in 2011 6. Diabetes mellitus type 2 controlled with hyperglycemia 7. 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 Hypertension  Recommendation: I'll start the patient on diltiazem along with digoxin for rate control. Would recommend discontinuing digoxin once the rate is controlled and continue diltiazem and metoprolol.  Continue amiodarone for now and probably may benefit from switching to oral amiodarone for chronic use. He has not had any further falls over the past 2 years, has been doing relatively well with regard to his health, still enjoys his life, would recommend consideration for restarting anticoagulation if patient and his wife are agreeable.   Also if he does not convert back to sinus rhythm, could recommend either a TEE cardioversion or anticoagulation for 3 weeks and considering cardioversion and using anticoagulation for a short-term. Patient has established relationship with Dr. Spero Geralds, will hand over the care to them unless otherwise recommended by him.   Adrian Prows, MD 04/12/2017, 12:55 PM [facility]. [place] Pager: [phone] Office: [phone] If no answer Cell [phone]

## 2017-04-12 NOTE — H&P (Signed)
PULMONARY / CRITICAL CARE MEDICINE   Name: Gerald Holt. MRN: [id number] DOB: Jan 07, 1924    ADMISSION DATE:  04/11/2017  CHIEF COMPLAINT:  Afib RVR   HISTORY OF PRESENT ILLNESS:   Limited H&P from medical record.  Patient states he had a prior CVA and has trouble with memory since.   He does not know details prompting him to ED today.  Wife called at number on face sheet, no answer.  Hx per chart review included recent Tx for fungal uti in setting frequent self-cath for neurogenic bladder and Tx for c-diff diarrhea w/PO vanoc recurrent 2/2 abx for frequent uti.  In ed pt afib rvr and eelvated lactic acid and started on sepsis tx w/vanco and zosyn.  Transfer documents possibly show admin of adenosine based on rhythm strips.  Patient started on amiodarone infusion.  At time of exam he denies complaints.  PAST MEDICAL HISTORY :  He  has a past medical history of A-fib ([place]); C. difficile diarrhea; Cataracts, bilateral; Chronic dermatitis; Diabetes type 2, controlled ([place]) (2000); History of chicken pox; Mumps; Retinopathy; Shingles; Stroke [facility]) (Nov 2011); Undescended testicle, unilateral; and Whooping cough. limited to what is in chart, patient unable to provide further 2/2 "memory problems" PAST SURGICAL HISTORY: He  has a past surgical history that includes Tooth extraction; Pre-malignant / benign skin lesion excision; Testicle surgery; and Cardiac valve replacement. limited to what is in chart, patient unable to provide further 2/2 "memory problems" Allergies  Allergen Reactions  . Flomax [Tamsulosin Hcl] Other (See Comments)    Hypotension  . Shellfish Allergy Anaphylaxis  . Aspirin-Dipyridamole Er Other (See Comments)    Headaches   . Other Other (See Comments)    Blood Thinner given in Rehab gave H/As - Not Coumadin/Headaches    limited to what is in chart, patient unable to provide further 2/2 "memory problems"  No current facility-administered medications on file  prior to encounter.    Current Outpatient Prescriptions on File Prior to Encounter  Medication Sig  . aspirin 81 MG tablet Take 1 tablet (81 mg total) by mouth daily.  . Black Elderberry,Berry-Flower, 575 MG CAPS Take 1 capsule by mouth 3 (three) times daily with meals.  . Calcium Carbonate Antacid (ALKA-SELTZER ANTACID PO) Take 2 tablets by mouth daily as needed (for indegestion).   . Cranberry 1000 MG CAPS Take 2 capsules by mouth daily.  . Digestive Enzyme CAPS Take by mouth. 1 daily with every meal  . docusate sodium (COLACE) 100 MG capsule Take 200 mg by mouth daily as needed for mild constipation.   . Eyelid Cleansers (STERILID EX) Apply 1 application topically See admin instructions. Eye Wash: Once Daily   . glucose blood test strip One Touch Verio strips Use as instructed to check fasting sugars once daily .Dx:E11.9  . levothyroxine (SYNTHROID, LEVOTHROID) 50 MCG tablet Take 1 tablet (50 mcg total) by mouth daily.  Marland Kitchen lovastatin (MEVACOR) 10 MG tablet TAKE 1 TABLET AT BEDTIME  . metFORMIN (GLUCOPHAGE-XR) 500 MG 24 hr tablet TAKE 2 TABLETS WITH BREAKFAST AND AT BEDTIME  . metoprolol tartrate (LOPRESSOR) 25 MG tablet Take 0.5 tablets (12.5 mg total) by mouth 2 (two) times daily.  . Miconazole Nitrate (TRIPLE PASTE AF) 2 % OINT Apply 1 application topically daily as needed (mouth care).   . Omega-3 Fatty Acids (SB OMEGA-3 FISH OIL) 1000 MG CAPS Take 1,000 mg by mouth daily. Reported on 10/06/2015  . OVER THE COUNTER MEDICATION Take 1 capsule by mouth  2 (two) times daily. OTC Focus Select  . triamcinolone cream (KENALOG) 0.1 % Apply 1 application topically daily as needed (for itching).   limited to what is in chart, patient unable to provide further 2/2 "memory problems"  FAMILY HISTORY:  His indicated that his mother is deceased. He indicated that his father is deceased. He indicated that the status of his brother is unknown. He indicated that his maternal grandmother is deceased. He  indicated that his maternal grandfather is deceased. He indicated that his paternal grandmother is deceased. He indicated that his paternal grandfather is deceased. He indicated that the status of his paternal aunt is unknown. He indicated that the status of his cousin is unknown.  limited to what is in chart, patient unable to provide further 2/2 "memory problems"  SOCIAL HISTORY: He  reports that he has quit smoking. He has never used smokeless tobacco. He reports that he does not drink alcohol or use drugs. limited to what is in chart, patient unable to provide further 2/2 "memory problems"  REVIEW OF SYSTEMS:   He denies cp, sob, n/v/d, abd pain, dysuria, fevers.  All other systems reviewed and negative   VITAL SIGNS: BP 107/82   Pulse (!) 158   Temp 98.5 F (36.9 C) (Oral)   Resp (!) 22   Ht 5\' 6"  (1.676 m)   Wt 161 lb 9.6 oz (73.3 kg)   SpO2 95%   BMI 26.08 kg/m   HEMODYNAMICS:     VENTILATOR SETTINGS:n/a    INTAKE / OUTPUT: No intake/output data recorded.   PHYSICAL EXAMINATION: General:  NAD sleeping, awakers to light touch Neuro:  Caox2, mae, no focal deficit HEENT:  Eomi, atnc, pink and moist mm Cardiovascular:  Tachy, irr-irr, no ccc, no murmur, cap refill <2seconds Lungs:  cta bilateral, good tv, no wheezing Abdomen:  Soft, ntnd, no hsm, +BS Musculoskeletal:  No red/wamr/swollen/tender, normal tone Skin:  Warm dry intact  LABS:  BMET  Recent Labs Lab 04/11/17 1540  NA 129*  K 4.7  CL 93*  CO2 24  BUN 20  CREATININE 1.10  GLUCOSE 148*    Electrolytes  Recent Labs Lab 04/11/17 1540  CALCIUM 9.2  MG 1.6*    CBC  Recent Labs Lab 04/11/17 1540  WBC 14.5*  HGB 11.1*  HCT 32.1*  PLT 237    Coag's No results for input(s): APTT, INR in the last 168 hours.  Sepsis Markers  Recent Labs Lab 04/11/17 1614 04/11/17 1936  LATICACIDVEN 4.44* 3.12*    ABG No results for input(s): PHART, PCO2ART, PO2ART in the last 168  hours.  Liver Enzymes  Recent Labs Lab 04/11/17 1540  AST 22  ALT 13*  ALKPHOS 45  BILITOT 0.5  ALBUMIN 3.2*    Cardiac Enzymes  Recent Labs Lab 04/11/17 1540  TROPONINI 0.06*    Glucose  Recent Labs Lab 04/11/17 1945  GLUCAP 170*    Imaging Dg Chest 2 View  Result Date: 04/11/2017 CLINICAL DATA:  Initial evaluation for acute chest pain, shortness of breath. EXAM: CHEST  2 VIEW COMPARISON:  Prior radiograph from 03/07/2017. FINDINGS: Cardiomegaly, similar to previous. Mediastinal silhouette normal. Aortic atherosclerosis. Lungs are hypoinflated. Patchy and linear bibasilar opacities favored to reflect atelectasis/bronchovascular crowding. There is diffusely increased pulmonary vascular congestion with interstitial prominence, suggesting mild pulmonary interstitial edema. No other definite focal infiltrates. No significant pleural effusion. No pneumothorax. No acute osseus abnormality. IMPRESSION: 1. Cardiomegaly with diffuse pulmonary vascular congestion and interstitial prominence, suggesting mild  pulmonary interstitial congestion/edema. 2. Shallow lung inflation with superimposed bibasilar atelectasis/bronchovascular crowding. 3. Aortic atherosclerosis. Electronically Signed   By: Jeannine Boga M.D.   On: 04/11/2017 16:23     STUDIES:  EKG wide complex tachy rbbb (old) SVT vs Afib  CULTURES: BCX 7/20 >> UCX 7/20  >>  ANTIBIOTICS: vanco IV 7/20 >> vanco po 7/20 >> Zosyn 7/20 >> Fluconazole 7/20 >>  LINES/TUBES: Foley 7/20>>  DISCUSSION: Problems: Afib RVR vs SVT Hyponatremia C-diff diarrhea Lacitc acidosis R/o sepsis r/o metformin associated Hyponatremia  CKD stage II Hypomagnesemia Elevated troponin likely type 2 NSTEMI Leukocytosis Normocytic anemia liekly AOCD DM type 2, uncontrolled w/hyperglycemia unclear relation to CKD Possible UTI Pulmonary edema   ASSESSMENT / PLAN:  PULMONARY A:pulmonary edema P:   Treat afib O2 PRN sats  <92  CARDIOVASCULAR A:  Hemodynamically stabel despite rapid rate P:  amio and BB Trend troponin tele Cards in AM PRN  RENAL A:   CKD, hyponatremia, lacitc acid trending down S/p IVFs at ED P:   Trend lactate, no need for more fluids based on exam, check serum osmo, urine osmo, urine Na  GASTROINTESTINAL A:   C-diff, benign exm P:   PPI  HEMATOLOGIC A:   Leukocytsosis. anemia P:  Monitor cbc  INFECTIOUS A:   Recurrent c-diff diarrhea R/o sepsis 2/2 cdiff and/or uti most liekly P:   Vanco, zosyn, serial lactate f/u cxs C/w oupt fluconazole frmo outside ID doc  ENDOCRINE A:   DM   P:   SSI  NEUROLOGIC A:   No pain or agitation P:   RASS goal: 0 CAM-ICU   FAMILY  - Updates: called wife noanswer   - Inter-disciplinary family meet or Palliative Care meeting due by:  day 7  DNR per medical record  Hanley Hays, MD  Pulmonary and [place] Pager: [phone]  04/12/2017, 12:06 AM

## 2017-04-13 ENCOUNTER — Inpatient Hospital Stay (HOSPITAL_COMMUNITY): Payer: Medicare Other

## 2017-04-13 DIAGNOSIS — I5033 Acute on chronic diastolic (congestive) heart failure: Secondary | ICD-10-CM

## 2017-04-13 LAB — ECHOCARDIOGRAM COMPLETE
AOVTI: 40 cm
AV Area VTI index: 1.04 cm2/m2
AV Area VTI: 1.56 cm2
AV Area mean vel: 1.7 cm2
AV Mean grad: 11 mmHg
AV Peak grad: 22 mmHg
AV peak Index: 0.86
AVA: 1.9 cm2
AVAREAMEANVIN: 0.93 cm2/m2
AVCELMEANRAT: 0.6
AVPKVEL: 235 cm/s
Ao pk vel: 0.55 m/s
CHL CUP AV VEL: 1.9
CHL CUP LVOT MV VTI INDEX: 0.75 cm2/m2
CHL CUP LVOT MV VTI: 1.36
CHL CUP MV DEC (S): 285
CHL CUP MV M VEL: 107
CHL CUP RV SYS PRESS: 38 mmHg
DOP CAL AO MEAN VELOCITY: 148 cm/s
E decel time: 285 msec
E/e' ratio: 36.64
FS: 21 % — AB (ref 28–44)
HEIGHTINCHES: 66 in
IVS/LV PW RATIO, ED: 1
LA diam index: 2.42 cm/m2
LA vol A4C: 100 ml
LA vol index: 61 mL/m2
LASIZE: 44 mm
LAVOL: 111 mL
LDCA: 2.84 cm2
LEFT ATRIUM END SYS DIAM: 44 mm
LV E/e' medial: 36.64
LVEEAVG: 36.64
LVELAT: 5.35 cm/s
LVOT SV: 76 mL
LVOT VTI: 26.7 cm
LVOT peak VTI: 0.67 cm
LVOT peak grad rest: 7 mmHg
LVOTD: 19 mm
LVOTPV: 129 cm/s
MV pk A vel: 131 m/s
MVANNULUSVTI: 55.8 cm
MVAP: 2.47 cm2
MVPG: 15 mmHg
MVPKEVEL: 196 m/s
Mean grad: 6 mmHg
P 1/2 time: 90 ms
PW: 10 mm — AB (ref 0.6–1.1)
RV LATERAL S' VELOCITY: 13.1 cm/s
Reg peak vel: 295 cm/s
TAPSE: 15.7 mm
TDI e' lateral: 5.35
TDI e' medial: 5.91
TRMAXVEL: 295 cm/s
Valve area index: 1.04
WEIGHTICAEL: 2564.39 [oz_av]

## 2017-04-13 LAB — MAGNESIUM: Magnesium: 2.2 mg/dL (ref 1.7–2.4)

## 2017-04-13 LAB — GLUCOSE, CAPILLARY
GLUCOSE-CAPILLARY: 139 mg/dL — AB (ref 65–99)
GLUCOSE-CAPILLARY: 139 mg/dL — AB (ref 65–99)
Glucose-Capillary: 115 mg/dL — ABNORMAL HIGH (ref 65–99)

## 2017-04-13 LAB — CBC
HCT: 30.6 % — ABNORMAL LOW (ref 39.0–52.0)
Hemoglobin: 10.5 g/dL — ABNORMAL LOW (ref 13.0–17.0)
MCH: 28.6 pg (ref 26.0–34.0)
MCHC: 34.3 g/dL (ref 30.0–36.0)
MCV: 83.4 fL (ref 78.0–100.0)
PLATELETS: 278 10*3/uL (ref 150–400)
RBC: 3.67 MIL/uL — ABNORMAL LOW (ref 4.22–5.81)
RDW: 14.2 % (ref 11.5–15.5)
WBC: 10.1 10*3/uL (ref 4.0–10.5)

## 2017-04-13 LAB — BASIC METABOLIC PANEL
ANION GAP: 9 (ref 5–15)
BUN: 11 mg/dL (ref 6–20)
CALCIUM: 8.6 mg/dL — AB (ref 8.9–10.3)
CO2: 22 mmol/L (ref 22–32)
CREATININE: 1.17 mg/dL (ref 0.61–1.24)
Chloride: 100 mmol/L — ABNORMAL LOW (ref 101–111)
GFR, EST NON AFRICAN AMERICAN: 53 mL/min — AB (ref >60)
GLUCOSE: 140 mg/dL — AB (ref 65–99)
Potassium: 4 mmol/L (ref 3.5–5.1)
Sodium: 131 mmol/L — ABNORMAL LOW (ref 135–145)

## 2017-04-13 LAB — C DIFFICILE QUICK SCREEN W PCR REFLEX
C DIFFICILE (CDIFF) TOXIN: NEGATIVE
C DIFFICLE (CDIFF) ANTIGEN: NEGATIVE
C Diff interpretation: NOT DETECTED

## 2017-04-13 LAB — PHOSPHORUS: Phosphorus: 4.1 mg/dL (ref 2.5–4.6)

## 2017-04-13 LAB — PROCALCITONIN

## 2017-04-13 MED ORDER — INSULIN ASPART 100 UNIT/ML ~~LOC~~ SOLN
0.0000 [IU] | Freq: Every day | SUBCUTANEOUS | Status: DC
  Administered 2017-04-15: 2 [IU] via SUBCUTANEOUS

## 2017-04-13 MED ORDER — AMIODARONE HCL 200 MG PO TABS
200.0000 mg | ORAL_TABLET | Freq: Two times a day (BID) | ORAL | Status: DC
  Administered 2017-04-13 – 2017-04-16 (×7): 200 mg via ORAL
  Filled 2017-04-13 (×8): qty 1

## 2017-04-13 MED ORDER — INSULIN ASPART 100 UNIT/ML ~~LOC~~ SOLN
0.0000 [IU] | Freq: Three times a day (TID) | SUBCUTANEOUS | Status: DC
  Administered 2017-04-13 – 2017-04-14 (×2): 3 [IU] via SUBCUTANEOUS
  Administered 2017-04-14 – 2017-04-15 (×4): 2 [IU] via SUBCUTANEOUS
  Administered 2017-04-15: 1 [IU] via SUBCUTANEOUS
  Administered 2017-04-16 (×2): 2 [IU] via SUBCUTANEOUS

## 2017-04-13 NOTE — Progress Notes (Signed)
[place] TEAM 1 - Stepdown/ICU TEAM  Loleta Chance.  VQM:[id number] DOB: August 14, 1924 DOA: 04/11/2017 PCP: [name], PA-C    Brief Narrative:  83 y.o. male w/ progressive dementia, parox Afib not on anticoag due to falls, chronic RBBB, recurrent UTIs, C. difficile colitis being treated as an outpt who was admitted to the hospital in atrial flutter with RVR after presenting w/ c/o severe generalized weakness.  Significant Events: 7/21 admit 7/22 TTE - pending   Subjective: Resting comfortably in bed.  Has no new complaints.  Denies cp, sob, n/v, or abdom pain.    Assessment & Plan:  Atrial flutter with RVR and 2:1 conduction - Parox Afib Cardiology following - amio initiated - resumption of anticoag being considered - begin to ambulate and follow HR w/ activity   SIRS v/s Sepsis  ?bibasilar pulmonary infiltrates v/s atx - given severity of illness will plan to complete a 5 day course of abx   Acute on chronic diastolic CHF - Acute pulmonary edema Edema most likely rate related - Oxygen requirement decreasing - wean to RA as able  Filed Weights   04/11/17 2200 04/12/17 0444 04/13/17 0500  Weight: 73.3 kg (161 lb 9.6 oz) 73.5 kg (162 lb 0.6 oz) 72.7 kg (160 lb 4.4 oz)    Elevated troponin  Most c/w demand ischemia in setting of tachycardia - f/u TTE for WMA  Acute kidney injury likley related to diminished CO in setting of tachycardia - crt appears to be normalizing    Recent Labs Lab 04/11/17 1540 04/12/17 0058 04/13/17 0201  CREATININE 1.10 1.42* 1.17    C diff colitis  C diff screen negative - stop oral vanc as soon as broad spectrum ABx is stopped   DM2 CBG currently well controlled  DVT prophylaxis: SQ heparin  Code Status: FULL CODE Family Communication: spoke w/ caregiver at bedside   Disposition Plan: transfer to cardiac tele bed - PT/OT evals   Consultants:  Cardiology   Antimicrobials:  Vanc IV 7/20 > Vanc po 7/20 > Zosyn 7/20  > Fluconazole 7/20 >  Objective: Blood pressure (!) 121/59, pulse 68, temperature 98.2 F (36.8 C), temperature source Oral, resp. rate (!) 21, height 5\' 6"  (1.676 m), weight 72.7 kg (160 lb 4.4 oz), SpO2 92 %.  Intake/Output Summary (Last 24 hours) at 04/13/17 1332 Last data filed at 04/13/17 1200  Gross per 24 hour  Intake           1046.7 ml  Output             1325 ml  Net           -278.3 ml   Filed Weights   04/11/17 2200 04/12/17 0444 04/13/17 0500  Weight: 73.3 kg (161 lb 9.6 oz) 73.5 kg (162 lb 0.6 oz) 72.7 kg (160 lb 4.4 oz)    Examination: General: No acute respiratory distress Lungs: poor air movement in B bases - no wheezing  Cardiovascular: Regular rate and rhythm without murmur  Abdomen: Nontender, nondistended, soft, bowel sounds positive, no rebound, no ascites, no appreciable mass Extremities: No significant cyanosis, clubbing, or edema bilateral lower extremities  CBC:  Recent Labs Lab 04/11/17 1540 04/12/17 0655 04/13/17 0201  WBC 14.5* 10.5 10.1  HGB 11.1* 10.3* 10.5*  HCT 32.1* 31.3* 30.6*  MCV 84.7 84.4 83.4  PLT 237 200 932   Basic Metabolic Panel:  Recent Labs Lab 04/11/17 1540 04/12/17 0058 04/13/17 0201  NA 129* 139 131*  K 4.7 3.4* 4.0  CL 93* 109 100*  CO2 24 20* 22  GLUCOSE 148* 62* 140*  BUN 20 19 11   CREATININE 1.10 1.42* 1.17  CALCIUM 9.2 8.3* 8.6*  MG 1.6* 1.7 2.2  PHOS  --  1.9* 4.1   GFR: Estimated Creatinine Clearance: 37.1 mL/min (by C-G formula based on SCr of 1.17 mg/dL).  Liver Function Tests:  Recent Labs Lab 04/11/17 1540  AST 22  ALT 13*  ALKPHOS 45  BILITOT 0.5  PROT 7.2  ALBUMIN 3.2*    Coagulation Profile:  Recent Labs Lab 04/12/17 0058  INR 1.01    Cardiac Enzymes:  Recent Labs Lab 04/11/17 1540 04/12/17 0048  TROPONINI 0.06* 0.06*    HbA1C: Hgb A1c MFr Bld  Date/Time Value Ref Range Status  03/05/2017 12:04 PM 7.2 (H) 4.6 - 6.5 % Final    Comment:    Glycemic Control  Guidelines for People with Diabetes:Non Diabetic:  <6%Goal of Therapy: <7%Additional Action Suggested:  >8%   10/14/2016 10:51 AM 6.4 4.6 - 6.5 % Final    Comment:    Glycemic Control Guidelines for People with Diabetes:Non Diabetic:  <6%Goal of Therapy: <7%Additional Action Suggested:  >8%     CBG:  Recent Labs Lab 04/12/17 1543 04/12/17 2024 04/12/17 2346 04/13/17 0505 04/13/17 0744  GLUCAP 205* 167* 154* 139* 139*    Recent Results (from the past 240 hour(s))  Urine Culture     Status: None   Collection Time: 04/11/17  4:55 PM  Result Value Ref Range Status   Specimen Description URINE, CATHETERIZED  Final   Special Requests NONE  Final   Culture   Final    NO GROWTH Performed at [facility], [place] [address] [place], [place] [address]    Report Status 04/12/2017 FINAL  Final  C difficile quick scan w PCR reflex     Status: None   Collection Time: 04/12/17 11:28 AM  Result Value Ref Range Status   C Diff antigen NEGATIVE NEGATIVE Final   C Diff toxin NEGATIVE NEGATIVE Final   C Diff interpretation No C. difficile detected.  Final     Scheduled Meds: . amiodarone  200 mg Oral BID  . diltiazem  240 mg Oral Daily  . fluconazole  100 mg Oral Daily  . heparin subcutaneous  5,000 Units Subcutaneous Q8H  . insulin aspart  2-6 Units Subcutaneous Q4H  . levothyroxine  50 mcg Oral QAC breakfast  . metoprolol tartrate  25 mg Oral BID  . pravastatin  10 mg Oral q1800  . vancomycin  250 mg Oral Q6H     LOS: 2 days   Cherene Altes, MD [facility] Office  [phone] Pager - Text Page per Amion as per below:  On-Call/Text Page:      [email or web address]      password [email or web address]  If 7PM-7AM, please contact night-coverage [email or web address] Password [email or web address] 04/13/2017, 1:32 PM

## 2017-04-13 NOTE — Progress Notes (Signed)
Rhythm converted to SR ?: Vitals remain stable

## 2017-04-13 NOTE — Progress Notes (Addendum)
Progress Note  Patient Name: Gerald Holt. Date of Encounter: 04/13/2017  Primary Cardiologist: Dr. Marlou Porch  Subjective   Denies any chest pain or pressure and no SOB.  Converted back to NSR this am.    Inpatient Medications    Scheduled Meds: . digoxin  0.125 mg Oral Daily  . diltiazem  240 mg Oral Daily  . fluconazole  100 mg Oral Daily  . heparin subcutaneous  5,000 Units Subcutaneous Q8H  . insulin aspart  2-6 Units Subcutaneous Q4H  . levothyroxine  50 mcg Oral QAC breakfast  . metoprolol tartrate  25 mg Oral BID  . pravastatin  10 mg Oral q1800  . vancomycin  250 mg Oral Q6H   Continuous Infusions: . sodium chloride    . amiodarone 30 mg/hr (04/13/17 0156)  . piperacillin-tazobactam (ZOSYN)  IV 3.375 g (04/13/17 0600)   PRN Meds: sodium chloride, metoprolol tartrate, ondansetron (ZOFRAN) IV   Vital Signs    Vitals:   04/13/17 0741 04/13/17 0800 04/13/17 0900 04/13/17 0958  BP:  133/68 (!) 121/59 (!) 121/59  Pulse:  73 68 68  Resp:  (!) 21 (!) 21   Temp: 97.6 F (36.4 C)     TempSrc: Oral     SpO2:  92% 92%   Weight:      Height:        Intake/Output Summary (Last 24 hours) at 04/13/17 1106 Last data filed at 04/13/17 0900  Gross per 24 hour  Intake           1080.1 ml  Output             1225 ml  Net           -144.9 ml   Filed Weights   04/11/17 2200 04/12/17 0444 04/13/17 0500  Weight: 161 lb 9.6 oz (73.3 kg) 162 lb 0.6 oz (73.5 kg) 160 lb 4.4 oz (72.7 kg)    Telemetry    NSR with PACs - Personally Reviewed  ECG    NSR with RBBB - Personally Reviewed  Physical Exam   GEN: No acute distress.   Neck: No JVD Cardiac: RRR, no murmurs, rubs, or gallops.  Respiratory: Clear to auscultation bilaterally. GI: Soft, nontender, non-distended  MS: No edema; No deformity. Neuro:  Nonfocal  Psych: Normal affect   Labs    Chemistry Recent Labs Lab 04/11/17 1540 04/12/17 0058 04/13/17 0201  NA 129* 139 131*  K 4.7 3.4* 4.0    CL 93* 109 100*  CO2 24 20* 22  GLUCOSE 148* 62* 140*  BUN 20 19 11   CREATININE 1.10 1.42* 1.17  CALCIUM 9.2 8.3* 8.6*  PROT 7.2  --   --   ALBUMIN 3.2*  --   --   AST 22  --   --   ALT 13*  --   --   ALKPHOS 45  --   --   BILITOT 0.5  --   --   GFRNONAA 57* 42* 53*  GFRAA >60 48* >60  ANIONGAP 12 10 9      Hematology Recent Labs Lab 04/11/17 1540 04/12/17 0655 04/13/17 0201  WBC 14.5* 10.5 10.1  RBC 3.79* 3.71* 3.67*  HGB 11.1* 10.3* 10.5*  HCT 32.1* 31.3* 30.6*  MCV 84.7 84.4 83.4  MCH 29.3 27.8 28.6  MCHC 34.6 32.9 34.3  RDW 14.5 14.4 14.2  PLT 237 200 278    Cardiac Enzymes Recent Labs Lab 04/11/17 1540 04/12/17 0048  TROPONINI  0.06* 0.06*   No results for input(s): TROPIPOC in the last 168 hours.   BNP Recent Labs Lab 04/11/17 1540  BNP 829.6*     DDimer No results for input(s): DDIMER in the last 168 hours.   Radiology    Dg Chest 2 View  Result Date: 04/11/2017 CLINICAL DATA:  Initial evaluation for acute chest pain, shortness of breath. EXAM: CHEST  2 VIEW COMPARISON:  Prior radiograph from 03/07/2017. FINDINGS: Cardiomegaly, similar to previous. Mediastinal silhouette normal. Aortic atherosclerosis. Lungs are hypoinflated. Patchy and linear bibasilar opacities favored to reflect atelectasis/bronchovascular crowding. There is diffusely increased pulmonary vascular congestion with interstitial prominence, suggesting mild pulmonary interstitial edema. No other definite focal infiltrates. No significant pleural effusion. No pneumothorax. No acute osseus abnormality. IMPRESSION: 1. Cardiomegaly with diffuse pulmonary vascular congestion and interstitial prominence, suggesting mild pulmonary interstitial congestion/edema. 2. Shallow lung inflation with superimposed bibasilar atelectasis/bronchovascular crowding. 3. Aortic atherosclerosis. Electronically Signed   By: Jeannine Boga M.D.   On: 04/11/2017 16:23   Dg Chest Port 1 View  Result Date:  04/13/2017 CLINICAL DATA:  Acute respiratory failure. EXAM: PORTABLE CHEST 1 VIEW COMPARISON:  04/11/2017 FINDINGS: Lungs are hypoinflated with bibasilar opacification left greater than right without significant change which may be due to atelectasis or infection. Stable cardiomegaly. Calcified plaque over the aortic arch. Degenerative change of the spine. IMPRESSION: Persistent bibasilar opacification which may be due to atelectasis or infection. Stable cardiomegaly. Aortic Atherosclerosis (ICD10-I70.0). Electronically Signed   By: Marin Olp M.D.   On: 04/13/2017 07:21    Cardiac Studies   none  Patient Profile     83 y.o. male with a history of dementia, recurrent UTI, C diff diarrhea admitted with sepsis and found to be in atrial flutter with 2:1 conduction and RBBB.  He has a history of atrial fibrillation in the past as well as a CVA.  His coumadin was  stopped due to several falls but he has not had any further falls in the past 2 years. He has a caregiver who takes care of him around the clock.    Assessment & Plan    1.  Atrial flutter with RVR and 2:1 conduction - he has converted to NSR early this am.  He is currently on IV Amio - will stop amio IV and start PO Amio 200mg  BID. - follow EKG daily for QTc (corrected QTc for RBBB is 547msec today) - continue BB and CCB - Agree with Dr. Einar Gip who did initial consult that patient would benefit from restarting anticoagulation as he has not had any falls in the past 2 years and has a caregiver 24/7.  Would discuss with wife and if patient and his wife are agreeable would start eliquis 5mg  BID. - 2D echo pending.  - TSH was normal at 3.7 in June  2.  PAF - this had been noted in the past and coumadin stopped a while ago due to falls but no falls have occurred in the past 2 years.  See above #1.  3.  Acute on chronic diastolic CHF - BNP elevated at 829.  No edema on cxray - this is likely secondary to atrial flutter with RVR - he has  put out 1L overnight and is net neg 121cc. - he does not appear volume overloaded on exam today so will hold on any further Lasix for now  4.  Elevated troponin - likely demand ischemia in the setting of aflutter with RVR and CHF - 2D  echo pending.  If LVF is normal then would not pursue further cardiac workup as he has not had any symptoms of angina.   Signed, Fransico Him, MD  04/13/2017, 11:06 AM

## 2017-04-13 NOTE — Progress Notes (Signed)
  Echocardiogram 2D Echocardiogram has been performed.  Gerald Holt 04/13/2017, 10:59 AM

## 2017-04-14 ENCOUNTER — Encounter (HOSPITAL_COMMUNITY): Payer: Self-pay

## 2017-04-14 ENCOUNTER — Encounter: Payer: Self-pay | Admitting: Physician Assistant

## 2017-04-14 ENCOUNTER — Ambulatory Visit: Payer: Self-pay | Admitting: Physician Assistant

## 2017-04-14 DIAGNOSIS — I5033 Acute on chronic diastolic (congestive) heart failure: Secondary | ICD-10-CM

## 2017-04-14 DIAGNOSIS — E872 Acidosis: Secondary | ICD-10-CM

## 2017-04-14 DIAGNOSIS — R339 Retention of urine, unspecified: Secondary | ICD-10-CM

## 2017-04-14 DIAGNOSIS — R748 Abnormal levels of other serum enzymes: Secondary | ICD-10-CM

## 2017-04-14 DIAGNOSIS — I4892 Unspecified atrial flutter: Secondary | ICD-10-CM

## 2017-04-14 LAB — CBC
HEMATOCRIT: 28.7 % — AB (ref 39.0–52.0)
Hemoglobin: 10 g/dL — ABNORMAL LOW (ref 13.0–17.0)
MCH: 29 pg (ref 26.0–34.0)
MCHC: 34.8 g/dL (ref 30.0–36.0)
MCV: 83.2 fL (ref 78.0–100.0)
Platelets: 258 10*3/uL (ref 150–400)
RBC: 3.45 MIL/uL — ABNORMAL LOW (ref 4.22–5.81)
RDW: 14.5 % (ref 11.5–15.5)
WBC: 9.7 10*3/uL (ref 4.0–10.5)

## 2017-04-14 LAB — COMPREHENSIVE METABOLIC PANEL
ALBUMIN: 2.5 g/dL — AB (ref 3.5–5.0)
ALT: 13 U/L — ABNORMAL LOW (ref 17–63)
AST: 16 U/L (ref 15–41)
Alkaline Phosphatase: 56 U/L (ref 38–126)
Anion gap: 9 (ref 5–15)
BILIRUBIN TOTAL: 1.1 mg/dL (ref 0.3–1.2)
BUN: 12 mg/dL (ref 6–20)
CHLORIDE: 101 mmol/L (ref 101–111)
CO2: 22 mmol/L (ref 22–32)
Calcium: 8.5 mg/dL — ABNORMAL LOW (ref 8.9–10.3)
Creatinine, Ser: 1.34 mg/dL — ABNORMAL HIGH (ref 0.61–1.24)
GFR calc Af Amer: 52 mL/min — ABNORMAL LOW (ref >60)
GFR calc non Af Amer: 45 mL/min — ABNORMAL LOW (ref >60)
GLUCOSE: 178 mg/dL — AB (ref 65–99)
POTASSIUM: 3.8 mmol/L (ref 3.5–5.1)
SODIUM: 132 mmol/L — AB (ref 135–145)
Total Protein: 5.7 g/dL — ABNORMAL LOW (ref 6.5–8.1)

## 2017-04-14 LAB — GLUCOSE, CAPILLARY
GLUCOSE-CAPILLARY: 176 mg/dL — AB (ref 65–99)
GLUCOSE-CAPILLARY: 207 mg/dL — AB (ref 65–99)
GLUCOSE-CAPILLARY: 157 mg/dL — AB (ref 65–99)
GLUCOSE-CAPILLARY: 193 mg/dL — AB (ref 65–99)
Glucose-Capillary: 123 mg/dL — ABNORMAL HIGH (ref 65–99)
Glucose-Capillary: 132 mg/dL — ABNORMAL HIGH (ref 65–99)
Glucose-Capillary: 207 mg/dL — ABNORMAL HIGH (ref 65–99)

## 2017-04-14 LAB — FLOW CYTOMETRY - CHCC SATELLITE

## 2017-04-14 LAB — PROCALCITONIN: Procalcitonin: 0.12 ng/mL

## 2017-04-14 MED ORDER — APIXABAN 2.5 MG PO TABS
2.5000 mg | ORAL_TABLET | Freq: Two times a day (BID) | ORAL | Status: DC
  Administered 2017-04-14 – 2017-04-16 (×4): 2.5 mg via ORAL
  Filled 2017-04-14 (×4): qty 1

## 2017-04-14 MED ORDER — DOXYCYCLINE HYCLATE 100 MG PO TABS
100.0000 mg | ORAL_TABLET | Freq: Two times a day (BID) | ORAL | Status: DC
  Administered 2017-04-14 – 2017-04-16 (×4): 100 mg via ORAL
  Filled 2017-04-14 (×4): qty 1

## 2017-04-14 MED ORDER — METOPROLOL TARTRATE 12.5 MG HALF TABLET
12.5000 mg | ORAL_TABLET | Freq: Two times a day (BID) | ORAL | Status: DC
  Administered 2017-04-14 – 2017-04-16 (×4): 12.5 mg via ORAL
  Filled 2017-04-14 (×4): qty 1

## 2017-04-14 NOTE — Evaluation (Signed)
Physical Therapy Evaluation Patient Details Name: Gerald Holt. MRN: [id number] DOB: 01-11-1924 Today's Date: 04/14/2017   History of Present Illness  83 y/o male brought to hospital with 2 day hx of weakness and diarrhea and was found to be in atrial fib with rapid ventricular response.  PMH includes paroxysmal a-fib, DM, hyperlipidemia, hypothyroidism, dementia, and uses self cath for urniary retention.  Clinical Impression  Pt admitted with above diagnosis. Pt currently with functional limitations due to the deficits listed below (see PT Problem List).  Pt will benefit from skilled PT to increase their independence and safety with mobility to allow discharge to the venue listed below.  Pt is much safer ambulator with RW and educated on using it at home at this time.  He verbalized understanding.  Recommend HHPT for home safety.     Follow Up Recommendations Home health PT;Supervision/Assistance - 24 hour    Equipment Recommendations  None recommended by PT    Recommendations for Other Services       Precautions / Restrictions Precautions Precautions: Fall      Mobility  Bed Mobility Overal bed mobility: Modified Independent             General bed mobility comments: bed flat without rails  Transfers Overall transfer level: Needs assistance Equipment used: None Transfers: Sit to/from Stand Sit to Stand: Supervision         General transfer comment: S for safety with pt maintaining wide BOS with slightly flexed knees upon standing  Ambulation/Gait Ambulation/Gait assistance: Min guard Ambulation Distance (Feet): 150 Feet Assistive device: Rolling walker (2 wheeled) Gait Pattern/deviations: Decreased step length - right;Decreased step length - left;Trunk flexed     General Gait Details: Amb short distance in the room and was reaching for counter and unsteady.  Given RW and demonstrated improved steadiness, but needed cues as gait progressed to stay  closer to RW and 1 episode on slight buvkeling, but pt able to self correct. Once back in room, pt wanted to ambulate without AD.  Again, he was more unsteady and using bed as support.  Stairs            Wheelchair Mobility    Modified Rankin (Stroke Patients Only)       Balance Overall balance assessment: Needs assistance   Sitting balance-Leahy Scale: Good       Standing balance-Leahy Scale: Poor Standing balance comment: Pt requires external support for safety with dynamic activity.  Fair- with static                             Pertinent Vitals/Pain Pain Assessment: No/denies pain    Home Living Family/patient expects to be discharged to:: Private residence Living Arrangements: Spouse/significant other Available Help at Discharge: Family;Personal care attendant (aide 9:00-6:00 5-6 days/week) Type of Home: House Home Access: Level entry     Home Layout: One level Home Equipment: Walker - 2 wheels;Cane - single point;Shower seat;Hand held shower head;Grab bars - tub/shower      Prior Function Level of Independence: Independent         Comments: Reports he was weaned from RW by PT      Hand Dominance   Dominant Hand: Right    Extremity/Trunk Assessment   Upper Extremity Assessment Upper Extremity Assessment: Defer to OT evaluation    Lower Extremity Assessment Lower Extremity Assessment: Overall WFL for tasks assessed;Generalized weakness    Cervical / Trunk  Assessment Cervical / Trunk Assessment: Normal  Communication   Communication: HOH  Cognition Arousal/Alertness: Awake/alert Behavior During Therapy: WFL for tasks assessed/performed Overall Cognitive Status: Within Functional Limits for tasks assessed Area of Impairment: Safety/judgement                         Safety/Judgement: Decreased awareness of safety;Decreased awareness of deficits            General Comments      Exercises     Assessment/Plan     PT Assessment Patient needs continued PT services  PT Problem List Decreased balance;Decreased knowledge of use of DME;Decreased safety awareness;Decreased strength       PT Treatment Interventions DME instruction;Gait training;Functional mobility training;Balance training;Therapeutic exercise;Therapeutic activities    PT Goals (Current goals can be found in the Care Plan section)  Acute Rehab PT Goals Patient Stated Goal: not use the walker PT Goal Formulation: With patient Time For Goal Achievement: 04/28/17 Potential to Achieve Goals: Good    Frequency Min 3X/week   Barriers to discharge        Co-evaluation PT/OT/SLP Co-Evaluation/Treatment: Yes Reason for Co-Treatment: For patient/therapist safety PT goals addressed during session: Mobility/safety with mobility         AM-PAC PT "6 Clicks" Daily Activity  Outcome Measure Difficulty turning over in bed (including adjusting bedclothes, sheets and blankets)?: None Difficulty moving from lying on back to sitting on the side of the bed? : None Difficulty sitting down on and standing up from a chair with arms (e.g., wheelchair, bedside commode, etc,.)?: None Help needed moving to and from a bed to chair (including a wheelchair)?: A Little Help needed walking in hospital room?: A Little Help needed climbing 3-5 steps with a railing? : A Little 6 Click Score: 21    End of Session Equipment Utilized During Treatment: Gait belt Activity Tolerance: Patient tolerated treatment well Patient left: in chair;with call bell/phone within reach;with chair alarm set Nurse Communication: Mobility status PT Visit Diagnosis: Unsteadiness on feet (R26.81);Difficulty in walking, not elsewhere classified (R26.2)    Time: 3893-7342 PT Time Calculation (min) (ACUTE ONLY): 22 min   Charges:   PT Evaluation $PT Eval Low Complexity: 1 Procedure     PT G Codes:        Gerald Holt, [place] Pager [phone] 04/14/2017   Gerald Holt 04/14/2017, 9:51 AM

## 2017-04-14 NOTE — Care Management Important Message (Signed)
Important Message  Patient Details  Name: Gerald Holt. MRN: [id number] Date of Birth: 10-Nov-1923   Medicare Important Message Given:  Yes    Zamiya Dillard Abena 04/14/2017, 9:05 AM

## 2017-04-14 NOTE — Progress Notes (Signed)
Pts wife called - ok with starting Eliquis. Dr. Wynetta Emery text paged and made aware.

## 2017-04-14 NOTE — Progress Notes (Signed)
Gerald Holt.  [name] DOB: Feb 16, 1924 DOA: 04/11/2017 PCP: [name], PA-C    Brief Narrative:  83 y.o. male w/ progressive dementia, parox Afib not on anticoag due to falls, chronic RBBB, recurrent UTIs, C. difficile colitis being treated as an outpt who was admitted to the hospital in atrial flutter with RVR after presenting w/ c/o severe generalized weakness.  Significant Events: 7/21 admit 7/22 TTE - EF 55-60% grade 2 DD   Subjective: He was ambulating this morning.  He had a near fall this morning, His care taker was with him and he ended up sitting on floor.   Has no new complaints.  Denies cp, sob, n/v, or abdom pain.    Assessment & Plan:  Atrial flutter with RVR and 2:1 conduction - Parox Afib Cardiology following - amio initiated - resumption of anticoag being considered - begin to ambulate and follow HR w/ activity, I discussed eliquis with patient today, he will discuss with his wife and consider it and let us know if he can accept the risks versus the potential benefits.    SIRS v/s Sepsis  ?bibasilar pulmonary infiltrates v/s atx - given severity of illness will plan to complete a 5 day course of abx   Acute on chronic diastolic CHF - Acute pulmonary edema Oxygen requirement decreasing - wean to RA as able  Filed Weights   04/11/17 2200 04/12/17 0444 04/13/17 0500  Weight: 73.3 kg (161 lb 9.6 oz) 73.5 kg (162 lb 0.6 oz) 72.7 kg (160 lb 4.4 oz)   Elevated troponin  Most c/w demand ischemia in setting of tachycardia - f/u TTE for WMA  Acute kidney injury likley related to diminished CO in setting of tachycardia - crt appears to be normalizing    Recent Labs Lab 04/11/17 1540 04/12/17 0058 04/13/17 0201 04/14/17 0302  CREATININE 1.10 1.42* 1.17 1.34*    C diff colitis  C diff screen negative - planning to stop oral vanc when broad spectrum ABx is stopped   DM2 CBG currently well controlled CBG (last 3)   Recent Labs   04/13/17 2340 04/14/17 0019 04/14/17 0715  GLUCAP 115* 132* 176*   DVT prophylaxis: SQ heparin  Code Status: FULL CODE Family Communication: spoke w/ caregiver at bedside   Disposition Plan: DC home tomorrow if stable  Consultants:  Cardiology   Antimicrobials:  Vanc IV 7/20 > Vanc po 7/20 > Zosyn 7/20 > Fluconazole 7/20 >  Objective: Blood pressure (!) 124/58, pulse 64, temperature 97.9 F (36.6 C), temperature source Oral, resp. rate 18, height 5\' 6"  (1.676 m), weight 72.7 kg (160 lb 4.4 oz), SpO2 95 %.  Intake/Output Summary (Last 24 hours) at 04/14/17 1038 Last data filed at 04/13/17 2250  Gross per 24 hour  Intake            613.4 ml  Output              300 ml  Net            313.4 ml   Filed Weights   04/11/17 2200 04/12/17 0444 04/13/17 0500  Weight: 73.3 kg (161 lb 9.6 oz) 73.5 kg (162 lb 0.6 oz) 72.7 kg (160 lb 4.4 oz)    Examination: General: Pt awake, ambulating in halls with walker, No acute respiratory distress Lungs: shallow bilateral, no crackles, no rales, no wheezing  Cardiovascular: irregular, normal s1, s2 sounds.  Abdomen: Nontender, nondistended, soft, bowel sounds positive, no rebound, no ascites, no  appreciable mass Extremities: No significant cyanosis, clubbing, or edema bilateral lower extremities  CBC:  Recent Labs Lab 04/11/17 1540 04/12/17 0655 04/13/17 0201 04/14/17 0302  WBC 14.5* 10.5 10.1 9.7  HGB 11.1* 10.3* 10.5* 10.0*  HCT 32.1* 31.3* 30.6* 28.7*  MCV 84.7 84.4 83.4 83.2  PLT 237 200 278 353   Basic Metabolic Panel:  Recent Labs Lab 04/11/17 1540 04/12/17 0058 04/13/17 0201 04/14/17 0302  NA 129* 139 131* 132*  K 4.7 3.4* 4.0 3.8  CL 93* 109 100* 101  CO2 24 20* 22 22  GLUCOSE 148* 62* 140* 178*  BUN 20 19 11 12   CREATININE 1.10 1.42* 1.17 1.34*  CALCIUM 9.2 8.3* 8.6* 8.5*  MG 1.6* 1.7 2.2  --   PHOS  --  1.9* 4.1  --    GFR: Estimated Creatinine Clearance: 32.4 mL/min (A) (by C-G formula based on SCr of  1.34 mg/dL (H)).  Liver Function Tests:  Recent Labs Lab 04/11/17 1540 04/14/17 0302  AST 22 16  ALT 13* 13*  ALKPHOS 45 56  BILITOT 0.5 1.1  PROT 7.2 5.7*  ALBUMIN 3.2* 2.5*    Coagulation Profile:  Recent Labs Lab 04/12/17 0058  INR 1.01    Cardiac Enzymes:  Recent Labs Lab 04/11/17 1540 04/12/17 0048  TROPONINI 0.06* 0.06*    HbA1C: Hgb A1c MFr Bld  Date/Time Value Ref Range Status  03/05/2017 12:04 PM 7.2 (H) 4.6 - 6.5 % Final    Comment:    Glycemic Control Guidelines for People with Diabetes:Non Diabetic:  <6%Goal of Therapy: <7%Additional Action Suggested:  >8%   10/14/2016 10:51 AM 6.4 4.6 - 6.5 % Final    Comment:    Glycemic Control Guidelines for People with Diabetes:Non Diabetic:  <6%Goal of Therapy: <7%Additional Action Suggested:  >8%     CBG:  Recent Labs Lab 04/13/17 1250 04/13/17 1605 04/13/17 2340 04/14/17 0019 04/14/17 0715  GLUCAP 123* 207* 115* 132* 176*    Recent Results (from the past 240 hour(s))  Blood culture (routine x 2)     Status: None (Preliminary result)   Collection Time: 04/11/17  4:15 PM  Result Value Ref Range Status   Specimen Description BLOOD RIGHT ANTECUBITAL  Final   Special Requests   Final    BOTTLES DRAWN AEROBIC AND ANAEROBIC Blood Culture adequate volume   Culture   Final    NO GROWTH 2 DAYS Performed at [facility], [place] [address] [place], [place] [address]    Report Status PENDING  Incomplete  Blood culture (routine x 2)     Status: None (Preliminary result)   Collection Time: 04/11/17  4:30 PM  Result Value Ref Range Status   Specimen Description BLOOD RIGHT FOREARM  Final   Special Requests   Final    BOTTLES DRAWN AEROBIC AND ANAEROBIC Blood Culture adequate volume   Culture   Final    NO GROWTH 2 DAYS Performed at [facility], [place] [address], [place], [place] [address]    Report Status PENDING  Incomplete  Urine Culture     Status: None   Collection Time: 04/11/17  4:55  PM  Result Value Ref Range Status   Specimen Description URINE, CATHETERIZED  Final   Special Requests NONE  Final   Culture   Final    NO GROWTH Performed at [facility], [address], [place], [place] [address]    Report Status 04/12/2017 FINAL  Final  C  difficile quick scan w PCR reflex     Status: None   Collection Time: 04/12/17 11:28 AM  Result Value Ref Range Status   C Diff antigen NEGATIVE NEGATIVE Final   C Diff toxin NEGATIVE NEGATIVE Final   C Diff interpretation No C. difficile detected.  Final     Scheduled Meds: . amiodarone  200 mg Oral BID  . diltiazem  240 mg Oral Daily  . doxycycline  100 mg Oral Q12H  . fluconazole  100 mg Oral Daily  . heparin subcutaneous  5,000 Units Subcutaneous Q8H  . insulin aspart  0-5 Units Subcutaneous QHS  . insulin aspart  0-9 Units Subcutaneous TID WC  . levothyroxine  50 mcg Oral QAC breakfast  . metoprolol tartrate  25 mg Oral BID  . pravastatin  10 mg Oral q1800  . vancomycin  250 mg Oral Q6H     LOS: 3 days   Murvin Natal, MD [facility] Office  [phone] Pager - Text Page per Shea Evans as per below:  On-Call/Text Page:      [email or web address]      password [email or web address]  If 7PM-7AM, please contact night-coverage [email or web address] Password [email or web address] 04/14/2017, 10:38 AM

## 2017-04-14 NOTE — Care Management Note (Signed)
Case Management Note  Patient Details  Name: Gerald Holt. MRN: [id number] Date of Birth: 1924-04-11  Subjective/Objective:  Pt presented for Atrial Fib. Pt is from home with family support. Plan for Xarelto vs Eliquis for home. Benefits check completed.- CM will provide pt with card once decision made on which one to use. CM will also discuss [facility] with patient as well.                    Action/Plan: S/W CHRISTINA @ [facility] # [id number]    1. ELIQUIS 2.5 MG BID   COVER- YES  CO-PAY- $ 96.18 Q/L 2 PILLS PER DAY  TIER- 3 DRUG  PRIOR APPROVAL- NO    2. ELIQUIS  5 MG BID   COVER- YES  CO-PAY- $ 96.18  Q/L 2 PILLS PER DAY  TIER- 3 DRUG  PRIOR APPROVAL- NO   PHARMACY : [facility] AND [facility]  Expected Discharge Date:                  Expected Discharge Plan:  [place]  In-House Referral:  NA  Discharge planning Services  CM Consult  Post Acute Care Choice:  Home Health Choice offered to:  Patient  DME Arranged:  N/A DME Agency:  NA  HH Arranged:  PT HH Agency:     Status of Service:  In process, will continue to follow  If discussed at Long Length of Stay Meetings, dates discussed:    Additional Comments:  Bethena Roys, RN 04/14/2017, 3:08 PM

## 2017-04-14 NOTE — Plan of Care (Signed)
Problem: Physical Regulation: Goal: Ability to maintain clinical measurements within normal limits will improve Outcome: Progressing Pt in NSR. Denies any CP or SOB. VSS. Afebrile throughout night. Abx administered. Continue to monitor.

## 2017-04-14 NOTE — Evaluation (Signed)
Occupational Therapy Evaluation Patient Details Name: Gerald Holt. MRN: [id number] DOB: [birth year] Today's Date: 04/14/2017    History of Present Illness 83 y/o male brought to hospital with 2 day hx of weakness and diarrhea and was found to be in atrial fib with rapid ventricular response.  PMH includes paroxysmal a-fib, DM, hyperlipidemia, hypothyroidism, dementia, and uses self cath for urniary retention.   Clinical Impression   This 83 yo male admitted and underwent above presents to acute OT with deficits below (see OT problem list) thus affecting his PLOF of what he reports as Mod I with basic ADLs. He will benefit form acute OT without need for follow up. He does have 24 hour A/S prn.    Follow Up Recommendations  No OT follow up;Supervision/Assistance - 24 hour    Equipment Recommendations  None recommended by OT       Precautions / Restrictions Precautions Precautions: Fall Restrictions Weight Bearing Restrictions: No      Mobility Bed Mobility Overal bed mobility: Modified Independent             General bed mobility comments: bed flat without rails  Transfers Overall transfer level: Needs assistance Equipment used: None Transfers: Sit to/from Stand Sit to Stand: Supervision         General transfer comment: S for safety with pt maintaining wide BOS with slightly flexed knees upon standing    Balance Overall balance assessment: Needs assistance   Sitting balance-Leahy Scale: Good       Standing balance-Leahy Scale: Poor Standing balance comment: Pt requires external support for safety with dynamic activity.  Fair- with static                           ADL either performed or assessed with clinical judgement   ADL Overall ADL's : Needs assistance/impaired Eating/Feeding: Independent;Sitting   Grooming: Set up;Sitting   Upper Body Bathing: Set up;Sitting   Lower Body Bathing: Minimal assistance Lower Body Bathing  Details (indicate cue type and reason): with min guard A sit<>stand Upper Body Dressing : Set up;Sitting   Lower Body Dressing: Minimal assistance;Sit to/from stand Lower Body Dressing Details (indicate cue type and reason): min guard A sit<>stand Toilet Transfer: Min Fish farm manager Details (indicate cue type and reason): Bed> out door and around circle>to recliner in room Toileting- Clothing Manipulation and Hygiene: Minimal assistance;Sit to/from stand               Vision Patient Visual Report: No change from baseline              Pertinent Vitals/Pain Pain Assessment: No/denies pain     Hand Dominance Right   Extremity/Trunk Assessment Upper Extremity Assessment Upper Extremity Assessment: Generalized weakness   Lower Extremity Assessment Lower Extremity Assessment: Overall WFL for tasks assessed;Generalized weakness   Cervical / Trunk Assessment Cervical / Trunk Assessment: Normal   Communication Communication Communication: HOH   Cognition Arousal/Alertness: Awake/alert Behavior During Therapy: WFL for tasks assessed/performed Overall Cognitive Status: Within Functional Limits for tasks assessed Area of Impairment: Safety/judgement                         Safety/Judgement: Decreased awareness of safety;Decreased awareness of deficits     General Comments: said he realized he was walking better with RW after we first tried to have him ambulate without any AD; however when back to room and post standing at  sink to wash his face he wanted to try again without RW and again was unsteady on his feet even though he felt like he did better. Encouarged him to use RW at home at least intially.              Home Living Family/patient expects to be discharged to:: Private residence Living Arrangements: Spouse/significant other Available Help at Discharge: Family;Personal care attendant (aide 9:00-6:00 5-6 days/week) Type of Home: House Home  Access: Level entry     Home Layout: One level     Bathroom Shower/Tub: Occupational psychologist: Standard     Home Equipment: Environmental consultant - 2 wheels;Cane - single point;Shower seat;Hand held shower head;Grab bars - tub/shower;Grab bars - toilet;Bedside commode          Prior Functioning/Environment Level of Independence: Independent        Comments: Reports he was weaned from RW by PT         OT Problem List: Decreased strength;Impaired balance (sitting and/or standing)      OT Treatment/Interventions: Self-care/ADL training;Patient/family education;Balance training;DME and/or AE instruction    OT Goals(Current goals can be found in the care plan section) Acute Rehab OT Goals Patient Stated Goal: not use the walker OT Goal Formulation: With patient Time For Goal Achievement: 04/21/17 Potential to Achieve Goals: Good  OT Frequency: Min 2X/week   Barriers to D/C:            Co-evaluation PT/OT/SLP Co-Evaluation/Treatment: Yes (partial) Reason for Co-Treatment: To address functional/ADL transfers PT goals addressed during session: Mobility/safety with mobility OT goals addressed during session: ADL's and self-care;Strengthening/ROM      AM-PAC PT "6 Clicks" Daily Activity     Outcome Measure Help from another person eating meals?: None Help from another person taking care of personal grooming?: None Help from another person toileting, which includes using toliet, bedpan, or urinal?: A Little Help from another person bathing (including washing, rinsing, drying)?: A Little Help from another person to put on and taking off regular upper body clothing?: None Help from another person to put on and taking off regular lower body clothing?: A Little 6 Click Score: 21   End of Session Equipment Utilized During Treatment: Gait belt;Rolling walker  Activity Tolerance: Patient tolerated treatment well Patient left: in chair;with call bell/phone within reach;with  chair alarm set  OT Visit Diagnosis: Unsteadiness on feet (R26.81)                Time: 3435-6861 OT Time Calculation (min): 32 min Charges:  OT General Charges $OT Visit: 1 Procedure OT Evaluation $OT Eval Moderate Complexity: 1 Procedure Golden Circle, OTR/L [phone] 04/14/2017

## 2017-04-14 NOTE — Progress Notes (Signed)
ANTICOAGULATION CONSULT NOTE - Initial Consult  Pharmacy Consult for apixaban  Indication: atrial fibrillation  Allergies  Allergen Reactions  . Flomax [Tamsulosin Hcl] Other (See Comments)    Hypotension  . Shellfish Allergy Anaphylaxis  . Aspirin-Dipyridamole Er Other (See Comments)    Headaches   . Other Other (See Comments)    Blood Thinner given in Rehab gave H/As - Not Coumadin/Headaches      Patient Measurements: Height: 5\' 6"  (167.6 cm) Weight: 158 lb 15.2 oz (72.1 kg) IBW/kg (Calculated) : 63.8  Vital Signs: Temp: 98 F (36.7 C) (07/23 1100) Temp Source: Oral (07/23 0545) BP: 118/78 (07/23 1100)  Labs:  Recent Labs  04/11/17 1540 04/12/17 0048 04/12/17 0058 04/12/17 0655 04/13/17 0201 04/14/17 0302  HGB 11.1*  --   --  10.3* 10.5* 10.0*  HCT 32.1*  --   --  31.3* 30.6* 28.7*  PLT 237  --   --  200 278 258  LABPROT  --   --  13.3  --   --   --   INR  --   --  1.01  --   --   --   CREATININE 1.10  --  1.42*  --  1.17 1.34*  TROPONINI 0.06* 0.06*  --   --   --   --     Estimated Creatinine Clearance: 32.4 mL/min (A) (by C-G formula based on SCr of 1.34 mg/dL (H)).   Medical History: Past Medical History:  Diagnosis Date  . A-fib ([place])   . C. difficile diarrhea   . Cataracts, bilateral   . Chronic dermatitis   . Diabetes type 2, controlled ([place]) 2000  . History of chicken pox   . Mumps    Adult  . Retinopathy   . Shingles   . Stroke [facility]) Nov 2011  . Undescended testicle, unilateral    Right  . Whooping cough     Assessment: 83 year old male with history of paf not currently on anticoagulation d/t concern for falls in the past. Patient has caregiver at home with him now and MD discussed resuming anticoagulation with family and they are in agreement at this time.   Dosing is a somewhat difficult decision, with history of falls and being admitted for near fall, age of 33, and slightly elevated scr of 1.3 will prescribe patient reduced dose  apixaban at this time. He does only meet 1/3 criteria but I feel in the best interest of the patient at this time to continue with the lower dose.  Goal of Therapy:  Monitor platelets by anticoagulation protocol: Yes   Plan:  Stop SQ heparin Start apixaban 2.5mg  bid this afternoon  Erin Hearing PharmD., BCPS Clinical Pharmacist Pager [phone] 04/14/2017 3:12 PM

## 2017-04-14 NOTE — Discharge Instructions (Addendum)
Information on my medicine - ELIQUIS (apixaban)  This medication education was reviewed with me or my healthcare representative as part of my discharge preparation.  The pharmacist that spoke with me during my hospital stay was:  Georgina Peer, [facility]  Why was Eliquis prescribed for you? Eliquis was prescribed for you to reduce the risk of a blood clot forming that can cause a stroke if you have a medical condition called atrial fibrillation (a type of irregular heartbeat).  What do You need to know about Eliquis ? Take your Eliquis TWICE DAILY - one tablet in the morning and one tablet in the evening with or without food. If you have difficulty swallowing the tablet whole please discuss with your pharmacist how to take the medication safely.  Take Eliquis exactly as prescribed by your doctor and DO NOT stop taking Eliquis without talking to the doctor who prescribed the medication.  Stopping may increase your risk of developing a stroke.  Refill your prescription before you run out.  After discharge, you should have regular check-up appointments with your healthcare provider that is prescribing your Eliquis.  In the future your dose may need to be changed if your kidney function or weight changes by a significant amount or as you get older.  What do you do if you miss a dose? If you miss a dose, take it as soon as you remember on the same day and resume taking twice daily.  Do not take more than one dose of ELIQUIS at the same time to make up a missed dose.  Important Safety Information A possible side effect of Eliquis is bleeding. You should call your healthcare provider right away if you experience any of the following: ? Bleeding from an injury or your nose that does not stop. ? Unusual colored urine (red or dark brown) or unusual colored stools (red or black). ? Unusual bruising for unknown reasons. ? A serious fall or if you hit your head (even if there is no  bleeding).  Some medicines may interact with Eliquis and might increase your risk of bleeding or clotting while on Eliquis. To help avoid this, consult your healthcare provider or pharmacist prior to using any new prescription or non-prescription medications, including herbals, vitamins, non-steroidal anti-inflammatory drugs (NSAIDs) and supplements.  This website has more information on Eliquis (apixaban): http://[email or web address].com/eliquis/home   Additional discharge instructions:  Please get your medications reviewed and adjusted by your Primary MD.  Please request your Primary MD to go over all Hospital Tests and Procedure/Radiological results at the follow up, please get all Hospital records sent to your Prim MD by signing hospital release before you go home.  If you had Pneumonia of Lung problems at the Hospital: Please get a 2 view Chest X ray done in 6-8 weeks after hospital discharge or sooner if instructed by your Primary MD.  If you have Congestive Heart Failure: Please call your Cardiologist or Primary MD anytime you have any of the following symptoms:  1) 3 pound weight gain in 24 hours or 5 pounds in 1 week  2) shortness of breath, with or without a dry hacking cough  3) swelling in the hands, feet or stomach  4) if you have to sleep on extra pillows at night in order to breathe  Follow cardiac low salt diet and 1.5 lit/day fluid restriction.  If you have diabetes Accuchecks 4 times/day, Once in AM empty stomach and then before each meal. Log in all results and  show them to your primary doctor at your next visit. If any glucose reading is under 80 or above 300 call your primary MD immediately.  If you have Seizure/Convulsions/Epilepsy: Please do not drive, operate heavy machinery, participate in activities at heights or participate in high speed sports until you have seen by Primary MD or a Neurologist and advised to do so again.  If you had Gastrointestinal  Bleeding: Please ask your Primary MD to check a complete blood count within one week of discharge or at your next visit. Your endoscopic/colonoscopic biopsies that are pending at the time of discharge, will also need to followed by your Primary MD.  Get Medicines reviewed and adjusted. Please take all your medications with you for your next visit with your Primary MD  Please request your Primary MD to go over all hospital tests and procedure/radiological results at the follow up, please ask your Primary MD to get all Hospital records sent to his/her office.  If you experience worsening of your admission symptoms, develop shortness of breath, life threatening emergency, suicidal or homicidal thoughts you must seek medical attention immediately by calling 911 or calling your MD immediately  if symptoms less severe.  You must read complete instructions/literature along with all the possible adverse reactions/side effects for all the Medicines you take and that have been prescribed to you. Take any new Medicines after you have completely understood and accpet all the possible adverse reactions/side effects.   Do not drive or operate heavy machinery when taking Pain medications.   Do not take more than prescribed Pain, Sleep and Anxiety Medications  Special Instructions: If you have smoked or chewed Tobacco  in the last 2 yrs please stop smoking, stop any regular Alcohol  and or any Recreational drug use.  Wear Seat belts while driving.  Please note You were cared for by a hospitalist during your hospital stay. If you have any questions about your discharge medications or the care you received while you were in the hospital after you are discharged, you can call the unit and asked to speak with the hospitalist on call if the hospitalist that took care of you is not available. Once you are discharged, your primary care physician will handle any further medical issues. Please note that NO REFILLS for  any discharge medications will be authorized once you are discharged, as it is imperative that you return to your primary care physician (or establish a relationship with a primary care physician if you do not have one) for your aftercare needs so that they can reassess your need for medications and monitor your lab values.  You can reach the hospitalist office at phone [phone] or fax [phone]   If you do not have a primary care physician, you can call [phone] for a physician referral.

## 2017-04-14 NOTE — Consult Note (Signed)
   [facility] CM Inpatient Consult   04/14/2017  Gerald Holt 10/07/1923 [id number]    Received call from Tammy with [facility] (home based palliative program administered by [facility]).  Tammy indicates [facility] received referral from Gerald Holt Primary Care MD. Gerald Holt had scheduled home visit planned but Gerald Holt was admitted to the hospital. [place] on following up post hospital discharge.  Please contact [facility] for further questions regarding program at [phone].  Will make inpatient RNCM aware of above.   Marthenia Rolling, MSN-Ed, RN,BSN [facility] Liaison [phone]

## 2017-04-14 NOTE — Progress Notes (Signed)
Progress Note  Patient Name: Gerald Holt. Date of Encounter: 04/14/2017  Primary Cardiologist: Dr. Marlou Porch   Subjective   No complaints from a cardiac standpoint. Pt just had a fall in room while transferring from the bathroom back to his bed. He lost his balance buy the sink and landed on his rear. He did not hit his head.   Inpatient Medications    Scheduled Meds: . amiodarone  200 mg Oral BID  . diltiazem  240 mg Oral Daily  . fluconazole  100 mg Oral Daily  . heparin subcutaneous  5,000 Units Subcutaneous Q8H  . insulin aspart  0-5 Units Subcutaneous QHS  . insulin aspart  0-9 Units Subcutaneous TID WC  . levothyroxine  50 mcg Oral QAC breakfast  . metoprolol tartrate  25 mg Oral BID  . pravastatin  10 mg Oral q1800  . vancomycin  250 mg Oral Q6H   Continuous Infusions: . piperacillin-tazobactam (ZOSYN)  IV Stopped (04/14/17 0948)   PRN Meds: metoprolol tartrate, ondansetron (ZOFRAN) IV   Vital Signs    Vitals:   04/13/17 2013 04/13/17 2059 04/13/17 2250 04/14/17 0545  BP:  125/69  (!) 118/59  Pulse:   64   Resp:  18  18  Temp: 99.5 F (37.5 C)   97.9 F (36.6 C)  TempSrc: Oral   Oral  SpO2:  95%  95%  Weight:      Height:        Intake/Output Summary (Last 24 hours) at 04/14/17 0925 Last data filed at 04/13/17 2250  Gross per 24 hour  Intake            630.1 ml  Output              300 ml  Net            330.1 ml   Filed Weights   04/11/17 2200 04/12/17 0444 04/13/17 0500  Weight: 161 lb 9.6 oz (73.3 kg) 162 lb 0.6 oz (73.5 kg) 160 lb 4.4 oz (72.7 kg)    Telemetry    NSR - Personally Reviewed  ECG    7/22 NSR with RBBB - Personally Reviewed  Physical Exam   GEN: No acute distress.  Elderly and pleasantly demented.  Neck: No JVD Cardiac: RRR, no murmurs, rubs, or gallops.  Respiratory: Clear to auscultation bilaterally. GI: Soft, nontender, non-distended  MS: No edema; No deformity. Neuro:  Nonfocal  Psych: Normal affect     Labs    Chemistry Recent Labs Lab 04/11/17 1540 04/12/17 0058 04/13/17 0201 04/14/17 0302  NA 129* 139 131* 132*  K 4.7 3.4* 4.0 3.8  CL 93* 109 100* 101  CO2 24 20* 22 22  GLUCOSE 148* 62* 140* 178*  BUN 20 19 11 12   CREATININE 1.10 1.42* 1.17 1.34*  CALCIUM 9.2 8.3* 8.6* 8.5*  PROT 7.2  --   --  5.7*  ALBUMIN 3.2*  --   --  2.5*  AST 22  --   --  16  ALT 13*  --   --  13*  ALKPHOS 45  --   --  56  BILITOT 0.5  --   --  1.1  GFRNONAA 57* 42* 53* 45*  GFRAA >60 48* >60 52*  ANIONGAP 12 10 9 9      Hematology Recent Labs Lab 04/12/17 0655 04/13/17 0201 04/14/17 0302  WBC 10.5 10.1 9.7  RBC 3.71* 3.67* 3.45*  HGB 10.3* 10.5* 10.0*  HCT 31.3* 30.6* 28.7*  MCV 84.4 83.4 83.2  MCH 27.8 28.6 29.0  MCHC 32.9 34.3 34.8  RDW 14.4 14.2 14.5  PLT 200 278 258    Cardiac Enzymes Recent Labs Lab 04/11/17 1540 04/12/17 0048  TROPONINI 0.06* 0.06*   No results for input(s): TROPIPOC in the last 168 hours.   BNP Recent Labs Lab 04/11/17 1540  BNP 829.6*     DDimer No results for input(s): DDIMER in the last 168 hours.   Radiology    Dg Chest Port 1 View  Result Date: 04/13/2017 CLINICAL DATA:  Acute respiratory failure. EXAM: PORTABLE CHEST 1 VIEW COMPARISON:  04/11/2017 FINDINGS: Lungs are hypoinflated with bibasilar opacification left greater than right without significant change which may be due to atelectasis or infection. Stable cardiomegaly. Calcified plaque over the aortic arch. Degenerative change of the spine. IMPRESSION: Persistent bibasilar opacification which may be due to atelectasis or infection. Stable cardiomegaly. Aortic Atherosclerosis (ICD10-I70.0). Electronically Signed   By: Marin Olp M.D.   On: 04/13/2017 07:21    Cardiac Studies   2D Echo 04/13/17 Study Conclusions  - Left ventricle: The cavity size was normal. There was mild   concentric hypertrophy. Systolic function was normal. The   estimated ejection fraction was in the  range of 55% to 60%.   Features are consistent with a pseudonormal left ventricular   filling pattern, with concomitant abnormal relaxation and   increased filling pressure (grade 2 diastolic dysfunction).   Doppler parameters are consistent with both elevated ventricular   end-diastolic filling pressure and elevated left atrial filling   pressure. - Aortic valve: Mildly calcified annulus. Trileaflet; moderately   calcified leaflets. There was mild stenosis. Mean gradient (S):   11 mm Hg. Valve area (VTI): 1.9 cm^2. Valve area (Vmax): 1.56   cm^2. Valve area (Vmean): 1.7 cm^2. - Mitral valve: Severely calcified annulus. Transvalvular velocity   was minimally increased. The findings are consistent with mild   stenosis. There was mild regurgitation. Mean gradient (D): 6 mm   Hg. Valve area by pressure half-time: 2.47 cm^2. Valve area by   continuity equation (using LVOT flow): 1.36 cm^2. - Left atrium: The atrium was moderately dilated. - Atrial septum: No defect or patent foramen ovale was identified. - Pulmonary arteries: PA peak pressure: 38 mm Hg (S). - Inferior vena cava: The vessel was normal in size. The   respirophasic diameter changes were blunted (< 50%), consistent   with elevated central venous pressure.  Impressions:  - The right ventricular systolic pressure was increased consistent   with mild pulmonary hypertension.  Patient Profile   83 y.o. male with a history of dementia, recurrent UTI, C diff diarrhea admitted with sepsis and found to be in atrial flutter with 2:1 conduction and RBBB.  He has a history of atrial fibrillation in the past as well as a CVA.  His coumadin was  stopped due to several falls but he has not had any further falls in the past 2 years. He has a caregiver who takes care of him around the clock.    Assessment & Plan     1. Atrial Flutter w/ RVR and 2:1 Conduction:  Converted to NSR on IV amiodarone>> later transition to PO. Maintaining NSR  on telemetry. Rate is controlled. Also on BB and CCB. It has been considered that patient would benefit from restarting anticoagulation as he has not had any falls at home in the past 2 years and has a caregiver  24/7. However he just had a fall earlier today in his room (loss balance). Plan was to discuss Eliquis with his wife. She is not currently present, however his home health aid is here and informed pt's spouse regarding Eliquis. Wife to f/u later today.   2. PAF: this had been noted in the past and coumadin stopped a while ago due to falls. See above #1.  3. Acute on Chronic Diastolic CHF: 2D echo with normal LVEF at 55-60% with G2DD. BNP elevated on admit at 829. F/u CXR yesterday w/o edema. Weight is down from 164>>160 lb. Breathing is stable and he appears euvolemic on exam. I/Os net + 254ml. Continue to monitor volume status and weights.   4. Elevated Troponin: flat low level trend, 0.06>>0.06, likely demand ischemia in the setting of rapid afib, CHF and sepsis.  2D echo with normal LVEF. No plans for further inpatient cardiac w/u.   5. Sepsis: per IM. Fluid resuscitated.   6. C. Diff: on antibiotics, per IM.   7. Hypokalemia: K 3.4. Needs supplemental K for repletion.   8. Renal Insuffiencey: SCr back up to 1.34 (1.17 yesterday). Lasix is on hold.     Signed, Lyda Jester, PA-C  04/14/2017, 9:25 AM

## 2017-04-15 ENCOUNTER — Encounter: Payer: Self-pay | Admitting: Physician Assistant

## 2017-04-15 ENCOUNTER — Inpatient Hospital Stay (HOSPITAL_COMMUNITY): Payer: Medicare Other

## 2017-04-15 DIAGNOSIS — Z8673 Personal history of transient ischemic attack (TIA), and cerebral infarction without residual deficits: Secondary | ICD-10-CM | POA: Insufficient documentation

## 2017-04-15 DIAGNOSIS — R079 Chest pain, unspecified: Secondary | ICD-10-CM

## 2017-04-15 DIAGNOSIS — R000 Tachycardia, unspecified: Secondary | ICD-10-CM

## 2017-04-15 LAB — CBC
HCT: 31.6 % — ABNORMAL LOW (ref 39.0–52.0)
HEMOGLOBIN: 10.7 g/dL — AB (ref 13.0–17.0)
MCH: 28.5 pg (ref 26.0–34.0)
MCHC: 33.9 g/dL (ref 30.0–36.0)
MCV: 84 fL (ref 78.0–100.0)
Platelets: 321 10*3/uL (ref 150–400)
RBC: 3.76 MIL/uL — AB (ref 4.22–5.81)
RDW: 14.3 % (ref 11.5–15.5)
WBC: 7.3 10*3/uL (ref 4.0–10.5)

## 2017-04-15 LAB — BASIC METABOLIC PANEL
ANION GAP: 8 (ref 5–15)
BUN: 11 mg/dL (ref 6–20)
CALCIUM: 8.9 mg/dL (ref 8.9–10.3)
CO2: 20 mmol/L — ABNORMAL LOW (ref 22–32)
Chloride: 106 mmol/L (ref 101–111)
Creatinine, Ser: 1.16 mg/dL (ref 0.61–1.24)
GFR, EST NON AFRICAN AMERICAN: 53 mL/min — AB (ref >60)
Glucose, Bld: 184 mg/dL — ABNORMAL HIGH (ref 65–99)
POTASSIUM: 3.7 mmol/L (ref 3.5–5.1)
SODIUM: 134 mmol/L — AB (ref 135–145)

## 2017-04-15 LAB — GLUCOSE, CAPILLARY
GLUCOSE-CAPILLARY: 150 mg/dL — AB (ref 65–99)
GLUCOSE-CAPILLARY: 176 mg/dL — AB (ref 65–99)
GLUCOSE-CAPILLARY: 199 mg/dL — AB (ref 65–99)
GLUCOSE-CAPILLARY: 201 mg/dL — AB (ref 65–99)
GLUCOSE-CAPILLARY: 204 mg/dL — AB (ref 65–99)

## 2017-04-15 MED ORDER — IOPAMIDOL (ISOVUE-370) INJECTION 76%
INTRAVENOUS | Status: AC
  Filled 2017-04-15: qty 50

## 2017-04-15 MED ORDER — POTASSIUM CHLORIDE IN NACL 20-0.9 MEQ/L-% IV SOLN
INTRAVENOUS | Status: AC
  Administered 2017-04-15: 02:00:00 via INTRAVENOUS
  Filled 2017-04-15 (×2): qty 1000

## 2017-04-15 MED ORDER — PRAVASTATIN SODIUM 20 MG PO TABS
10.0000 mg | ORAL_TABLET | Freq: Every day | ORAL | Status: DC
  Administered 2017-04-15: 10 mg via ORAL
  Filled 2017-04-15: qty 1

## 2017-04-15 MED ORDER — SODIUM CHLORIDE 0.9 % IV SOLN: INTRAVENOUS | Status: DC

## 2017-04-15 MED ORDER — AMIODARONE HCL 200 MG PO TABS: 200.0000 mg | ORAL_TABLET | Freq: Every day | ORAL | Status: DC

## 2017-04-15 NOTE — Progress Notes (Signed)
Progress Note  Patient Name: Gerald Holt. Date of Encounter: 04/15/2017  Primary Cardiologist: Dr. Marlou Porch  Subjective   Pt was sleeping on entering the room. Patient is feeling well; denies chest pain, SOB, and palpitations.    Inpatient Medications    Scheduled Meds: . amiodarone  200 mg Oral BID  . apixaban  2.5 mg Oral BID  . diltiazem  240 mg Oral Daily  . doxycycline  100 mg Oral Q12H  . fluconazole  100 mg Oral Daily  . insulin aspart  0-5 Units Subcutaneous QHS  . insulin aspart  0-9 Units Subcutaneous TID WC  . iopamidol      . levothyroxine  50 mcg Oral QAC breakfast  . metoprolol tartrate  12.5 mg Oral BID  . pravastatin  10 mg Oral q1800  . vancomycin  250 mg Oral Q6H   Continuous Infusions: . 0.9 % NaCl with KCl 20 mEq / L 75 mL/hr at 04/15/17 0226   PRN Meds: metoprolol tartrate, ondansetron (ZOFRAN) IV   Vital Signs    Vitals:   04/14/17 2359 04/15/17 0022 04/15/17 0200 04/15/17 0500  BP: (!) 142/71   (!) 141/61  Pulse:    (!) 58  Resp:    18  Temp: 98 F (36.7 C)  98 F (36.7 C) 98.2 F (36.8 C)  TempSrc: Oral   Oral  SpO2:  95%  95%  Weight:    156 lb 9.6 oz (71 kg)  Height:        Intake/Output Summary (Last 24 hours) at 04/15/17 0735 Last data filed at 04/15/17 0600  Gross per 24 hour  Intake            839.5 ml  Output             2000 ml  Net          -1160.5 ml   Filed Weights   04/13/17 0500 04/14/17 0953 04/15/17 0500  Weight: 160 lb 4.4 oz (72.7 kg) 158 lb 15.2 oz (72.1 kg) 156 lb 9.6 oz (71 kg)     Physical Exam   General: Well developed, well nourished, male appearing in no acute distress. Head: Normocephalic, atraumatic.  Neck: Supple without bruits, no JVD. Lungs:  Resp regular and unlabored, CTA. Heart: RRR, S1, S2, systolic murmur; no rub. Abdomen: Soft, non-tender, non-distended with normoactive bowel sounds. No hepatomegaly. No rebound/guarding. No obvious abdominal masses. Extremities: No  clubbing, cyanosis, no edema. Distal pedal pulses are 2+ bilaterally. Neuro: Alert and oriented X 3. Moves all extremities spontaneously. Psych: Normal affect.  Labs    Chemistry Recent Labs Lab 04/11/17 1540 04/12/17 0058 04/13/17 0201 04/14/17 0302  NA 129* 139 131* 132*  K 4.7 3.4* 4.0 3.8  CL 93* 109 100* 101  CO2 24 20* 22 22  GLUCOSE 148* 62* 140* 178*  BUN 20 19 11 12   CREATININE 1.10 1.42* 1.17 1.34*  CALCIUM 9.2 8.3* 8.6* 8.5*  PROT 7.2  --   --  5.7*  ALBUMIN 3.2*  --   --  2.5*  AST 22  --   --  16  ALT 13*  --   --  13*  ALKPHOS 45  --   --  56  BILITOT 0.5  --   --  1.1  GFRNONAA 57* 42* 53* 45*  GFRAA >60 48* >60 52*  ANIONGAP 12 10 9 9      Hematology Recent Labs Lab 04/12/17 0655 04/13/17 0201 04/14/17  0302  WBC 10.5 10.1 9.7  RBC 3.71* 3.67* 3.45*  HGB 10.3* 10.5* 10.0*  HCT 31.3* 30.6* 28.7*  MCV 84.4 83.4 83.2  MCH 27.8 28.6 29.0  MCHC 32.9 34.3 34.8  RDW 14.4 14.2 14.5  PLT 200 278 258    Cardiac Enzymes Recent Labs Lab 04/11/17 1540 04/12/17 0048  TROPONINI 0.06* 0.06*   No results for input(s): TROPIPOC in the last 168 hours.   BNP Recent Labs Lab 04/11/17 1540  BNP 829.6*     DDimer No results for input(s): DDIMER in the last 168 hours.   Radiology    Ct Angio Head W Or Wo Contrast  Result Date: 04/15/2017 CLINICAL DATA:  Left-sided weakness EXAM: CT ANGIOGRAPHY HEAD AND NECK TECHNIQUE: Multidetector CT imaging of the head and neck was performed using the standard protocol during bolus administration of intravenous contrast. Multiplanar CT image reconstructions and MIPs were obtained to evaluate the vascular anatomy. Carotid stenosis measurements (when applicable) are obtained utilizing NASCET criteria, using the distal internal carotid diameter as the denominator. CONTRAST:  50 mL Isovue 370 COMPARISON:  Head CT 05/30/2016 Brain MRI 06/08/2016 FINDINGS: CTA NECK FINDINGS Aortic arch: There is no aneurysm or dissection of the  visualized ascending aorta or aortic arch. There is a normal 3 vessel branching pattern. The visualized proximal subclavian arteries are widely patent. There is atherosclerotic plaque and calcification within the aortic arch. Right carotid system: The right common carotid origin is widely patent. There is no common carotid or internal carotid artery dissection or aneurysm. There is mixed calcified and noncalcified atherosclerosis at the carotid bifurcation without hemodynamically significant stenosis. Left carotid system: The left common carotid origin is widely patent. There is no common carotid or internal carotid artery dissection or aneurysm. No hemodynamically significant stenosis. Vertebral arteries: The vertebral system is left dominant. At least moderate narrowing of the right vertebral artery origin secondary to atherosclerotic calcification and noncalcified plaque. Normal left vertebral artery origin. Diminutive right V4 segment distal to the origin of the right posterior inferior cerebellar artery. Both vertebral arteries are otherwise normal to their confluence with the basilar artery. Skeleton: There is no bony spinal canal stenosis. No lytic or blastic lesions. Other neck: The nasopharynx is clear. The oropharynx and hypopharynx are normal. The epiglottis is normal. The supraglottic larynx, glottis and subglottic larynx are normal. No retropharyngeal collection. The parapharyngeal spaces are preserved. The parotid and submandibular glands are normal. No sialolithiasis or salivary ductal dilatation. The thyroid gland is normal. There is no cervical lymphadenopathy. Upper chest: Bilateral pleural effusions are at least medium-sized, but incompletely imaged. Review of the MIP images confirms the above findings CTA HEAD FINDINGS Anterior circulation: --Intracranial internal carotid arteries: Mild atherosclerotic calcification bilaterally without hemodynamically significant stenosis. --Anterior cerebral  arteries: Congenital variant diminutive left A1 segment. Otherwise normal --Middle cerebral arteries: Normal. --Posterior communicating arteries: Present on the left. Posterior circulation: --Posterior cerebral arteries: Fetal origin of the left PCA. Otherwise normal. --Superior cerebellar arteries: Normal. --Basilar artery: Normal. --Anterior inferior cerebellar arteries: Normal. --Posterior inferior cerebellar arteries: Normal. Venous sinuses: As permitted by contrast timing, patent. Anatomic variants: Fetal origin of the left posterior cerebral artery. Hypoplastic left anterior cerebral artery A1 segment. Delayed phase: Not performed Review of the MIP images confirms the above findings IMPRESSION: 1. No emergent large vessel occlusion or high-grade stenosis. 2. Mild right carotid bifurcation atherosclerosis without hemodynamically significant stenosis. Normal cervical left carotid system. Mild atherosclerotic calcification of the intracranial internal carotid arteries at the skullbase.  3. At least moderate narrowing of the origin of the nondominant right vertebral artery. 4.  Aortic Atherosclerosis (ICD10-I70.0). 5. Bilateral pleural effusions, incompletely visualized. These results were called by telephone at the time of interpretation on 04/15/2017 at 12:55 am to Dr. Gean Birchwood , who verbally acknowledged these results. Electronically Signed   By: Ulyses Jarred M.D.   On: 04/15/2017 01:08   Ct Angio Neck W Or Wo Contrast  Result Date: 04/15/2017 CLINICAL DATA:  Left-sided weakness EXAM: CT ANGIOGRAPHY HEAD AND NECK TECHNIQUE: Multidetector CT imaging of the head and neck was performed using the standard protocol during bolus administration of intravenous contrast. Multiplanar CT image reconstructions and MIPs were obtained to evaluate the vascular anatomy. Carotid stenosis measurements (when applicable) are obtained utilizing NASCET criteria, using the distal internal carotid diameter as the  denominator. CONTRAST:  50 mL Isovue 370 COMPARISON:  Head CT 05/30/2016 Brain MRI 06/08/2016 FINDINGS: CTA NECK FINDINGS Aortic arch: There is no aneurysm or dissection of the visualized ascending aorta or aortic arch. There is a normal 3 vessel branching pattern. The visualized proximal subclavian arteries are widely patent. There is atherosclerotic plaque and calcification within the aortic arch. Right carotid system: The right common carotid origin is widely patent. There is no common carotid or internal carotid artery dissection or aneurysm. There is mixed calcified and noncalcified atherosclerosis at the carotid bifurcation without hemodynamically significant stenosis. Left carotid system: The left common carotid origin is widely patent. There is no common carotid or internal carotid artery dissection or aneurysm. No hemodynamically significant stenosis. Vertebral arteries: The vertebral system is left dominant. At least moderate narrowing of the right vertebral artery origin secondary to atherosclerotic calcification and noncalcified plaque. Normal left vertebral artery origin. Diminutive right V4 segment distal to the origin of the right posterior inferior cerebellar artery. Both vertebral arteries are otherwise normal to their confluence with the basilar artery. Skeleton: There is no bony spinal canal stenosis. No lytic or blastic lesions. Other neck: The nasopharynx is clear. The oropharynx and hypopharynx are normal. The epiglottis is normal. The supraglottic larynx, glottis and subglottic larynx are normal. No retropharyngeal collection. The parapharyngeal spaces are preserved. The parotid and submandibular glands are normal. No sialolithiasis or salivary ductal dilatation. The thyroid gland is normal. There is no cervical lymphadenopathy. Upper chest: Bilateral pleural effusions are at least medium-sized, but incompletely imaged. Review of the MIP images confirms the above findings CTA HEAD FINDINGS  Anterior circulation: --Intracranial internal carotid arteries: Mild atherosclerotic calcification bilaterally without hemodynamically significant stenosis. --Anterior cerebral arteries: Congenital variant diminutive left A1 segment. Otherwise normal --Middle cerebral arteries: Normal. --Posterior communicating arteries: Present on the left. Posterior circulation: --Posterior cerebral arteries: Fetal origin of the left PCA. Otherwise normal. --Superior cerebellar arteries: Normal. --Basilar artery: Normal. --Anterior inferior cerebellar arteries: Normal. --Posterior inferior cerebellar arteries: Normal. Venous sinuses: As permitted by contrast timing, patent. Anatomic variants: Fetal origin of the left posterior cerebral artery. Hypoplastic left anterior cerebral artery A1 segment. Delayed phase: Not performed Review of the MIP images confirms the above findings IMPRESSION: 1. No emergent large vessel occlusion or high-grade stenosis. 2. Mild right carotid bifurcation atherosclerosis without hemodynamically significant stenosis. Normal cervical left carotid system. Mild atherosclerotic calcification of the intracranial internal carotid arteries at the skullbase. 3. At least moderate narrowing of the origin of the nondominant right vertebral artery. 4.  Aortic Atherosclerosis (ICD10-I70.0). 5. Bilateral pleural effusions, incompletely visualized. These results were called by telephone at the time of interpretation on 04/15/2017 at  12:55 am to Dr. Gean Birchwood , who verbally acknowledged these results. Electronically Signed   By: Ulyses Jarred M.D.   On: 04/15/2017 01:08   Ct Head Code Stroke Wo Contrast  Result Date: 04/15/2017 CLINICAL DATA:  Code stroke.  Left-sided weakness EXAM: CT HEAD WITHOUT CONTRAST TECHNIQUE: Contiguous axial images were obtained from the base of the skull through the vertex without intravenous contrast. COMPARISON:  None. FINDINGS: Brain: No mass lesion or acute hemorrhage. No focal  hypoattenuation of the basal ganglia or cortex to indicate infarcted tissue. There is generalized volume loss. There is periventricular hypoattenuation compatible with chronic microvascular disease. Old bilateral cerebellar infarcts and centrum semiovale lacunar infarcts. Vascular: No hyperdense vessel. Atherosclerotic calcification of the vertebral and internal carotid arteries at the skull base. Skull: Normal visualized skull base, calvarium and extracranial soft tissues. Sinuses/Orbits: No sinus fluid levels or advanced mucosal thickening. No mastoid effusion. Normal orbits. ASPECTS [facility] Stroke Program Early CT Score) - Ganglionic level infarction (caudate, lentiform nuclei, internal capsule, insula, M1-M3 cortex): 7 - Supraganglionic infarction (M4-M6 cortex): 3 Total score (0-10 with 10 being normal): 10 IMPRESSION: 1. No acute hemorrhage or mass lesion. 2. Generalized atrophy; multiple old, small infarcts and findings of chronic microvascular ischemia. 3. ASPECTS is 10. These results were called by telephone at the time of interpretation on 04/15/2017 at 12:53 am to Dr. Kerney Elbe , who verbally acknowledged these results. Electronically Signed   By: Ulyses Jarred M.D.   On: 04/15/2017 00:54     Telemetry    NSR in the 50-60s - Personally Reviewed  ECG    No new tracings - Personally Reviewed   Cardiac Studies   2D Echo 04/13/17 Study Conclusions  - Left ventricle: The cavity size was normal. There was mild concentric hypertrophy. Systolic function was normal. The estimated ejection fraction was in the range of 55% to 60%. Features are consistent with a pseudonormal left ventricular filling pattern, with concomitant abnormal relaxation and increased filling pressure (grade 2 diastolic dysfunction). Doppler parameters are consistent with both elevated ventricular end-diastolic filling pressure and elevated left atrial filling pressure. - Aortic valve: Mildly  calcified annulus. Trileaflet; moderately calcified leaflets. There was mild stenosis. Mean gradient (S): 11 mm Hg. Valve area (VTI): 1.9 cm^2. Valve area (Vmax): 1.56 cm^2. Valve area (Vmean): 1.7 cm^2. - Mitral valve: Severely calcified annulus. Transvalvular velocity was minimally increased. The findings are consistent with mild stenosis. There was mild regurgitation. Mean gradient (D): 6 mm Hg. Valve area by pressure half-time: 2.47 cm^2. Valve area by continuity equation (using LVOT flow): 1.36 cm^2. - Left atrium: The atrium was moderately dilated. - Atrial septum: No defect or patent foramen ovale was identified. - Pulmonary arteries: PA peak pressure: 38 mm Hg (S). - Inferior vena cava: The vessel was normal in size. The respirophasic diameter changes were blunted (<50%), consistent with elevated central venous pressure.  Impressions:  - The right ventricular systolic pressure was increased consistent with mild pulmonary hypertension.  Patient Profile     83 y.o. male with a history of dementia, recurrent UTI, C diff diarrhea admitted with sepsis and found to be in atrial flutter with 2:1 conduction and RBBB. He has a history of atrial fibrillation in the past as well as a CVA. His coumadin was stopped due to several falls but he has not had any further falls in the past 2 years. He has a caregiver who takes care of him around the clock.  Assessment & Plan  1. Atrial flutter with RVR and 2:1 conduction - pt converted on IV amiodarone, now on PO amiodarone - lopressor decreased yesterday to avoid bradycardia - he is maintaining sinus rhythm in the 50-60s, EKG pending to document rhythm - wife is in agreement with anticoagulation, pharmacy consulted for eliquis   2. Paroxysmal atrial fibrillation - plan to restart anticoagulation using eliquis   3. Left-sided weakness - per nursing, pt had an episode of left-sided weakeness yesterday when he  got up to the [facility] - code stroke was called and head CT was negative - eliquis was continued - no weakness appreciated on today's exam; he has had prior strokes and his baseline is unclear - MRI brain pending - PT needs to clear him for home - his caregiver is only at his home 9a-6p   4. Acute on chronic diastolic heart failure - he is net negative about 1L with 2L urine output yesterday - he appears euvolemic on exam, no lasix   5. Sepsis and C diff - per primary team, no further diarrhea   6. Hypokalemia   - resolved, 3.8 yesterday, labs pending   7. Acute renal insufficiency - labs pending today; baseline is .99-1.06    Signed, Ledora Bottcher , PA-C 7:35 AM 04/15/2017 Pager: [phone]

## 2017-04-15 NOTE — Progress Notes (Signed)
Physical Therapy Treatment/ Re-Evaluation Patient Details Name: Gerald Holt. MRN: [id number] DOB: 1924-01-25 Today's Date: 04/15/2017    History of Present Illness 83 y/o male brought to hospital with 2 day hx of weakness and diarrhea and was found to be in atrial fib with rapid ventricular response. Pt now s/p rapid response due to facial droop and L sided weakness since late last night. No bleed noted on CT scan. Awaiting possible MRI. PMH includes paroxysmal a-fib, DM, hyperlipidemia, hypothyroidism, dementia, and uses self cath for urniary retention.    PT Comments    Pt presents with new onset of left UE and LE deficits and left facial drooping which began late yesterday evening. CT was negative, awaiting MRI results. Pt is unable to perform bed mobility or transfers without Mod to Max A this session due to increased difficulty coordinating LUE on RW and LLE under him in standing. Pt now requires +2 for transfers. Due to current mobility deficits and lack of 24hr assistance at home, pt will benefit from SNF at discharge for short term rehab in order to improve his mobility before returning home. Pt continues to require follow-up acutely to address new deficits.    Follow Up Recommendations  SNF     Equipment Recommendations  None recommended by PT    Recommendations for Other Services       Precautions / Restrictions Precautions Precautions: Fall Restrictions Weight Bearing Restrictions: No    Mobility  Bed Mobility Overal bed mobility: Needs Assistance Bed Mobility: Supine to Sit;Sit to Supine     Supine to sit: Mod assist Sit to supine: Mod assist   General bed mobility comments: Mod A to sit up EOB and to bring LE's into and out of bed this session.   Transfers Overall transfer level: Needs assistance Equipment used: Rolling walker (2 wheeled) Transfers: Sit to/from Stand Sit to Stand: Max assist;+2 physical assistance         General transfer  comment: Max a +2 to stand. First attempt pt is unable to bring LLE under him to stand and requires cues to position LE's. Pt requires Max A to stand on second attempt with increased left postural lean. pt having increased diffiuclty positioning his LUE on RW and maintaining it there throughout standing.    Ambulation/Gait             General Gait Details: Did not attempt this session due to recent onset of new symptoms   Stairs            Wheelchair Mobility    Modified Rankin (Stroke Patients Only) Modified Rankin (Stroke Patients Only) Pre-Morbid Rankin Score: Moderate disability Modified Rankin: Moderately severe disability     Balance Overall balance assessment: Needs assistance Sitting-balance support: Single extremity supported Sitting balance-Leahy Scale: Fair Sitting balance - Comments: pt's LUE moved off railing and he sits with trunk flexed at EOB. Requires cues to maintain upright posture at EOB Postural control: Left lateral lean Standing balance support: Bilateral upper extremity supported Standing balance-Leahy Scale: Poor Standing balance comment: reliant on clinicians and RW for stability.                             Cognition Arousal/Alertness: Awake/alert Behavior During Therapy: WFL for tasks assessed/performed Overall Cognitive Status: Impaired/Different from baseline Area of Impairment: Safety/judgement  Safety/Judgement: Decreased awareness of safety;Decreased awareness of deficits     General Comments: pt is unaware of new LUE deficits until they are addressed       Exercises      General Comments General comments (skin integrity, edema, etc.): pt demonstrates an increase in LUE and LLE strenght deficits. At least 3-/5 with poor muscle control of LUE and 2/5 LLE with poor muscle control. Decreased UE and LE coordination noted this session.       Pertinent Vitals/Pain Pain Assessment:  No/denies pain    Home Living                      Prior Function            PT Goals (current goals can now be found in the care plan section) Acute Rehab PT Goals Patient Stated Goal: not use the walker PT Goal Formulation: With patient Time For Goal Achievement: 04/29/17 Potential to Achieve Goals: Good Progress towards PT goals: Not progressing toward goals - comment (see clinical impression)    Frequency    Min 3X/week      PT Plan Discharge plan needs to be updated    Co-evaluation              AM-PAC PT "6 Clicks" Daily Activity  Outcome Measure  Difficulty turning over in bed (including adjusting bedclothes, sheets and blankets)?: Total Difficulty moving from lying on back to sitting on the side of the bed? : Total Difficulty sitting down on and standing up from a chair with arms (e.g., wheelchair, bedside commode, etc,.)?: Total Help needed moving to and from a bed to chair (including a wheelchair)?: Total Help needed walking in hospital room?: Total Help needed climbing 3-5 steps with a railing? : Total 6 Click Score: 6    End of Session Equipment Utilized During Treatment: Gait belt Activity Tolerance: Patient limited by fatigue Patient left: in bed;with call bell/phone within reach;with nursing/sitter in room Nurse Communication: Mobility status PT Visit Diagnosis: Unsteadiness on feet (R26.81);Other abnormalities of gait and mobility (R26.89);Hemiplegia and hemiparesis Hemiplegia - Right/Left: Left Hemiplegia - dominant/non-dominant: Non-dominant Hemiplegia - caused by: Cerebral infarction     Time: 4081-4481 PT Time Calculation (min) (ACUTE ONLY): 25 min  Charges:  $Therapeutic Activity: 8-22 mins                    G Codes:       Scheryl Marten PT, DPT  [phone]    Shanon Rosser 04/15/2017, 1:56 PM

## 2017-04-15 NOTE — Progress Notes (Signed)
Patient called for assistance to the restroom. During day needed assistance standing but was able to ambulate. Patient was having difficulty sitting up and was a difficult transfer to commode due to newly noted left sided weakness/uncoordination. Left sided facial droop noted as well. X3 assist required to transfer patient back to bed. MD paged, [facility] to bedside to assess patient. Limb ataxia to left side. Visual deficit noted to left visual field. Patient last seen normal at 7/23 2230.

## 2017-04-15 NOTE — Plan of Care (Signed)
Problem: Pain Managment: Goal: General experience of comfort will improve Outcome: Progressing Patient has had no complaints of pain throughout shift.   Problem: Skin Integrity: Goal: Risk for impaired skin integrity will decrease Outcome: Progressing Skin under sacral pad assessed, pad replaced. Peri-care completed after BM. Foley maintained due to urinary retention.   Problem: Activity: Goal: Risk for activity intolerance will decrease Outcome: Progressing Left sided weakness noted, refer to notes. Code stroke had been called. CT completed. PT/OT to reassess patient today.

## 2017-04-15 NOTE — NC FL2 (Signed)
[place] LEVEL OF CARE SCREENING TOOL     IDENTIFICATION  Patient Name: Gerald Holt. Birthdate: Aug 10, 1924 Sex: male Admission Date (Current Location): 04/11/2017  [facility] and [place] Number:  Herbalist and Address:  [facility]. [facility], [place] [address], [place], [place] [address]      Provider Number: [id number]  Attending Physician Name and Address:  Murlean Iba, MD  Relative Name and Phone Number:  [place], [place] [phone]      Current Level of Care: Hospital Recommended Level of Care: [place] Prior Approval Number:    Date Approved/Denied:   PASRR Number: [id number]  Discharge Plan: SNF    Current Diagnoses: Patient Active Problem List   Diagnosis Date Noted  . Cerebral embolism with cerebral infarction 04/15/2017  . Acute on chronic diastolic CHF (congestive heart failure) ([place])   . Elevated troponin   . Sepsis due to other etiology ([place]) 04/11/2017  . Atrial fibrillation with rapid ventricular response ([place]) 04/11/2017  . Lactic acidosis 04/11/2017  . Atrial flutter with rapid ventricular response ([place]) 04/11/2017  . C. difficile enteritis 04/02/2017  . Leukocytosis 03/23/2017  . Essential hypertension 03/07/2017  . Nausea & vomiting 02/18/2017  . Weakness generalized 02/18/2017  . Weakness 02/18/2017  . Intermittent self-catheterization of bladder 11/22/2016  . Enlarged prostate 11/22/2016  . Urinary retention 10/31/2016  . Diabetes mellitus with complication ([place])   . Renal insufficiency 10/25/2016  . Encounter for therapeutic drug monitoring 10/20/2016  . Hx: UTI (urinary tract infection) 10/20/2016  . Hypothyroidism 09/15/2016  . Gastroesophageal reflux disease without esophagitis 07/09/2015  . Falls 06/22/2015  . Diabetes mellitus type II, uncontrolled ([place]) 05/29/2015  . Paroxysmal atrial fibrillation ([place]) 05/29/2015  . HLD (hyperlipidemia) 05/29/2015  .  Hx of completed stroke 05/29/2015    Orientation RESPIRATION BLADDER Height & Weight     Self, Situation, Place  Normal Incontinent Weight: 156 lb 9.6 oz (71 kg) Height:  5\' 6"  (167.6 cm)  BEHAVIORAL SYMPTOMS/MOOD NEUROLOGICAL BOWEL NUTRITION STATUS      Continent Diet (carb modified; please see DC summary)  AMBULATORY STATUS COMMUNICATION OF NEEDS Skin   Limited Assist Verbally Normal                       Personal Care Assistance Level of Assistance  Bathing, Feeding, Dressing Bathing Assistance: Limited assistance Feeding assistance: Limited assistance Dressing Assistance: Limited assistance     Functional Limitations Info  Sight, Hearing, Speech Sight Info: Adequate Hearing Info: Adequate Speech Info: Adequate    SPECIAL CARE FACTORS FREQUENCY  PT (By licensed PT), OT (By licensed OT)     PT Frequency: 5x/week OT Frequency: 5x/week            Contractures Contractures Info: Not present    Additional Factors Info  Code Status, Allergies, Insulin Sliding Scale Code Status Info: DNR Allergies Info: Flomax Tamsulosin Hcl, Shellfish Allergy, Aspirin-dipyridamole Er, Other   Insulin Sliding Scale Info: insulin 3x/day and at bedtime       Current Medications (04/15/2017):  This is the current hospital active medication list Current Facility-Administered Medications  Medication Dose Route Frequency Provider Last Rate Last Dose  . 0.9 % NaCl with KCl 20 mEq/ L  infusion   Intravenous Continuous Wynetta Emery, Clanford L, MD 60 mL/hr at 04/15/17 0928    . amiodarone (PACERONE) tablet 200 mg  200 mg Oral BID Skeet Latch, MD   200 mg  at 04/15/17 1204  . [START ON 04/24/2017] amiodarone (PACERONE) tablet 200 mg  200 mg Oral Daily Skeet Latch, MD      . apixaban Arne Cleveland) tablet 2.5 mg  2.5 mg Oral BID Lyndee Leo, RPH   2.5 mg at 04/15/17 1204  . diltiazem (CARDIZEM CD) 24 hr capsule 240 mg  240 mg Oral Daily Adrian Prows, MD   240 mg at 04/15/17 1205  .  doxycycline (VIBRA-TABS) tablet 100 mg  100 mg Oral Q12H Johnson, Clanford L, MD   100 mg at 04/15/17 1203  . fluconazole (DIFLUCAN) tablet 100 mg  100 mg Oral Daily Charlesetta Garibaldi, MD   100 mg at 04/15/17 1205  . insulin aspart (novoLOG) injection 0-5 Units  0-5 Units Subcutaneous QHS Joette Catching T, MD      . insulin aspart (novoLOG) injection 0-9 Units  0-9 Units Subcutaneous TID WC Cherene Altes, MD   2 Units at 04/15/17 1153  . iopamidol (ISOVUE-370) 76 % injection           . levothyroxine (SYNTHROID, LEVOTHROID) tablet 50 mcg  50 mcg Oral QAC breakfast Charlesetta Garibaldi, MD   50 mcg at 04/15/17 0854  . metoprolol tartrate (LOPRESSOR) injection 2.5-5 mg  2.5-5 mg Intravenous Q4H PRN Rigoberto Noel, MD   2.5 mg at 04/12/17 2102  . metoprolol tartrate (LOPRESSOR) tablet 12.5 mg  12.5 mg Oral BID Skeet Latch, MD   12.5 mg at 04/15/17 1204  . ondansetron (ZOFRAN) injection 4 mg  4 mg Intravenous Q6H PRN Charlesetta Garibaldi, MD      . pravastatin (PRAVACHOL) tablet 10 mg  10 mg Oral q1800 Charlesetta Garibaldi, MD   10 mg at 04/14/17 1721  . vancomycin (VANCOCIN) 50 mg/mL oral solution 250 mg  250 mg Oral Q6H Charlesetta Garibaldi, MD   250 mg at 04/15/17 1153     Discharge Medications: Please see discharge summary for a list of discharge medications.  Relevant Imaging Results:  Relevant Lab Results:   Additional Information SSN: [id number]  Estanislado Emms, LCSW

## 2017-04-15 NOTE — Progress Notes (Signed)
Gerald Holt.  STM:[id number] DOB: 1924-03-29 DOA: 04/11/2017 PCP: [name], PA-C    Brief Narrative:  83 y.o. male w/ progressive dementia, parox Afib not on anticoag due to falls, chronic RBBB, recurrent UTIs, C. difficile colitis being treated as an outpt who was admitted to the hospital in atrial flutter with RVR after presenting w/ c/o severe generalized weakness.  Significant Events: 7/21 admit 7/22 TTE - EF 55-60% grade 2 DD   Subjective: He was ambulating this morning.  He had a near fall this morning, His care taker was with him and he ended up sitting on floor.   Has no new complaints.  Denies cp, sob, n/v, or abdom pain.    Assessment & Plan:  Question of acute CVA - Pt has pronounced new neuro deficits after episode of weakness this morning.  MRI pending, neurology team following.  PT now recommending SNF.  Social worker consulted to work on placement.  Continue eliquis for now.    Atrial flutter with RVR and 2:1 conduction - Parox Afib Cardiology following - amio initiated - resumption of anticoag being considered - begin to ambulate and follow HR w/ activity, discussed anticoagulation with patient and wife and they agreed to start eliquis.  Counseling done by pharmacist.    SIRS v/s Sepsis  ?bibasilar pulmonary infiltrates v/s atx - given severity of illness will plan to complete a 5 day course of abx thru 04/17/17.     Acute on chronic diastolic CHF - Acute pulmonary edema Oxygen requirement decreasing - wean to RA as able  Filed Weights   04/13/17 0500 04/14/17 0953 04/15/17 0500  Weight: 72.7 kg (160 lb 4.4 oz) 72.1 kg (158 lb 15.2 oz) 71 kg (156 lb 9.6 oz)   Elevated troponin  Most c/w demand ischemia in setting of tachycardia - f/u TTE for WMA  Acute kidney injury likley related to diminished CO in setting of tachycardia - crt appears to be normalizing    Recent Labs Lab 04/11/17 1540 04/12/17 0058 04/13/17 0201 04/14/17 0302  04/15/17 1038  CREATININE 1.10 1.42* 1.17 1.34* 1.16   Fungus Balls in Urine - his outpatient ID doctor at [facility] started him on fluconazole 100 mg daily x 14 days thru 04/23/17.   C diff colitis  C diff screen negative - planning to stop oral vanc when broad spectrum ABx is stopped   DM2 CBG stable, following CBG (last 3)   Recent Labs  04/15/17 0020 04/15/17 0727 04/15/17 1118  GLUCAP 201* 199* 176*   DVT prophylaxis: SQ heparin  Code Status: FULL CODE Family Communication: spoke w/ caregiver at bedside   Disposition Plan: given new CVA, plan SNF placement  Consultants:  Cardiology   Antimicrobials:  Vanc IV 7/20 > Vanc po 7/20 > Zosyn 7/20 > Fluconazole 7/20 >04/23/17 then STOP  Objective: Blood pressure (!) 141/61, pulse (!) 58, temperature 98.2 F (36.8 C), temperature source Oral, resp. rate 18, height 5\' 6"  (1.676 m), weight 71 kg (156 lb 9.6 oz), SpO2 95 %.  Intake/Output Summary (Last 24 hours) at 04/15/17 1230 Last data filed at 04/15/17 0827  Gross per 24 hour  Intake            719.5 ml  Output             2014 ml  Net          -1294.5 ml   Filed Weights   04/13/17 0500 04/14/17 0953 04/15/17  0500  Weight: 72.7 kg (160 lb 4.4 oz) 72.1 kg (158 lb 15.2 oz) 71 kg (156 lb 9.6 oz)    Examination: General: Pt awake, ambulating in halls with walker, No acute respiratory distress Lungs: shallow bilateral, no crackles, no rales, no wheezing  Cardiovascular: irregular, normal s1, s2 sounds.  Abdomen: Nontender, nondistended, soft, bowel sounds positive, no rebound, no ascites, no appreciable mass Extremities: No significant cyanosis, clubbing, or edema bilateral lower extremities  CBC:  Recent Labs Lab 04/11/17 1540 04/12/17 0655 04/13/17 0201 04/14/17 0302 04/15/17 1038  WBC 14.5* 10.5 10.1 9.7 7.3  HGB 11.1* 10.3* 10.5* 10.0* 10.7*  HCT 32.1* 31.3* 30.6* 28.7* 31.6*  MCV 84.7 84.4 83.4 83.2 84.0  PLT 237 200 278 258 329   Basic  Metabolic Panel:  Recent Labs Lab 04/11/17 1540 04/12/17 0058 04/13/17 0201 04/14/17 0302 04/15/17 1038  NA 129* 139 131* 132* 134*  K 4.7 3.4* 4.0 3.8 3.7  CL 93* 109 100* 101 106  CO2 24 20* 22 22 20*  GLUCOSE 148* 62* 140* 178* 184*  BUN 20 19 11 12 11   CREATININE 1.10 1.42* 1.17 1.34* 1.16  CALCIUM 9.2 8.3* 8.6* 8.5* 8.9  MG 1.6* 1.7 2.2  --   --   PHOS  --  1.9* 4.1  --   --    GFR: Estimated Creatinine Clearance: 37.4 mL/min (by C-G formula based on SCr of 1.16 mg/dL).  Liver Function Tests:  Recent Labs Lab 04/11/17 1540 04/14/17 0302  AST 22 16  ALT 13* 13*  ALKPHOS 45 56  BILITOT 0.5 1.1  PROT 7.2 5.7*  ALBUMIN 3.2* 2.5*    Coagulation Profile:  Recent Labs Lab 04/12/17 0058  INR 1.01    Cardiac Enzymes:  Recent Labs Lab 04/11/17 1540 04/12/17 0048  TROPONINI 0.06* 0.06*    HbA1C: Hgb A1c MFr Bld  Date/Time Value Ref Range Status  03/05/2017 12:04 PM 7.2 (H) 4.6 - 6.5 % Final    Comment:    Glycemic Control Guidelines for People with Diabetes:Non Diabetic:  <6%Goal of Therapy: <7%Additional Action Suggested:  >8%   10/14/2016 10:51 AM 6.4 4.6 - 6.5 % Final    Comment:    Glycemic Control Guidelines for People with Diabetes:Non Diabetic:  <6%Goal of Therapy: <7%Additional Action Suggested:  >8%     CBG:  Recent Labs Lab 04/14/17 1632 04/14/17 2121 04/15/17 0020 04/15/17 0727 04/15/17 1118  GLUCAP 157* 193* 201* 199* 176*    Recent Results (from the past 240 hour(s))  Blood culture (routine x 2)     Status: None (Preliminary result)   Collection Time: 04/11/17  4:15 PM  Result Value Ref Range Status   Specimen Description BLOOD RIGHT ANTECUBITAL  Final   Special Requests   Final    BOTTLES DRAWN AEROBIC AND ANAEROBIC Blood Culture adequate volume   Culture   Final    NO GROWTH 3 DAYS Performed at [facility], [place] [address], [place], [place] [address]    Report Status PENDING  Incomplete  Blood culture (routine x  2)     Status: None (Preliminary result)   Collection Time: 04/11/17  4:30 PM  Result Value Ref Range Status   Specimen Description BLOOD RIGHT FOREARM  Final   Special Requests   Final    BOTTLES DRAWN AEROBIC AND ANAEROBIC Blood Culture adequate volume   Culture   Final    NO GROWTH 3 DAYS Performed at [facility], [address] [place], [place] [address]    Report Status PENDING  Incomplete  Urine Culture     Status: None   Collection Time: 04/11/17  4:55 PM  Result Value Ref Range Status   Specimen Description URINE, CATHETERIZED  Final   Special Requests NONE  Final   Culture   Final    NO GROWTH Performed at [facility], [address], [place], [place] [address]    Report Status 04/12/2017 FINAL  Final  C difficile quick scan w PCR reflex     Status: None   Collection Time: 04/12/17 11:28 AM  Result Value Ref Range Status   C Diff antigen NEGATIVE NEGATIVE Final   C Diff toxin NEGATIVE NEGATIVE Final   C Diff interpretation No C. difficile detected.  Final     Scheduled Meds: . amiodarone  200 mg Oral BID  . [START ON 04/24/2017] amiodarone  200 mg Oral Daily  . apixaban  2.5 mg Oral BID  . diltiazem  240 mg Oral Daily  . doxycycline  100 mg Oral Q12H  . fluconazole  100 mg Oral Daily  . insulin aspart  0-5 Units Subcutaneous QHS  . insulin aspart  0-9 Units Subcutaneous TID WC  . iopamidol      . levothyroxine  50 mcg Oral QAC breakfast  . metoprolol tartrate  12.5 mg Oral BID  . pravastatin  10 mg Oral q1800  . vancomycin  250 mg Oral Q6H     LOS: 4 days   Murvin Natal, MD [facility] Office  [phone] Pager - Text Page per Shea Evans as per below:  On-Call/Text Page:      [email or web address]      password [email or web address]  If 7PM-7AM, please contact night-coverage [email or web address] Password [email or web address] 04/15/2017, 12:30 PM

## 2017-04-15 NOTE — Progress Notes (Signed)
STROKE TEAM PROGRESS NOTE   HISTORY OF PRESENT ILLNESS (per record) Gerald Holt. is an 83 y.o. male with a history of progressive dementia, type II diabetes mellitus, shingles, stroke in 2011, and paroxysmal Afib who was admitted on 7/21 for management of atrial flutter with RVR after initially presenting with a c/c of severe generalized weakness. He has not been on anticoagulation due to falls, but was started on Eliquis 7/23.  Late Monday night, he was noted by nursing to be manifesting acute left sided weakness with facial droop. LKN was 10:20 PM. Emergent Neurological evaluation also revealed a left visual field cut. NIHSS was 7.  An emergent CTA of head and neck was obtained revealing no LVO and no intracranial hemorrhage. LSN: 10:20 PM  Patient was not administered IV t-PA secondary to taking oral anticoagulation. He was admitted to 3W for further evaluation and treatment.   SUBJECTIVE (INTERVAL HISTORY) His  caregiver is at the bedside.  OBJECTIVE Temp:  [98 F (36.7 C)-99.3 F (37.4 C)] 98.2 F (36.8 C) (07/24 0500) Pulse Rate:  [56-64] 58 (07/24 0500) Cardiac Rhythm: Normal sinus rhythm (07/24 0803) Resp:  [16-18] 18 (07/24 0500) BP: (117-142)/(56-79) 141/61 (07/24 0500) SpO2:  [95 %-97 %] 95 % (07/24 0500) FiO2 (%):  [0 %] 0 % (07/24 0022) Weight:  [71 kg (156 lb 9.6 oz)-72.1 kg (158 lb 15.2 oz)] 71 kg (156 lb 9.6 oz) (07/24 0500)  CBC:  Recent Labs Lab 04/13/17 0201 04/14/17 0302  WBC 10.1 9.7  HGB 10.5* 10.0*  HCT 30.6* 28.7*  MCV 83.4 83.2  PLT 278 419    Basic Metabolic Panel:  Recent Labs Lab 04/12/17 0058 04/13/17 0201 04/14/17 0302  NA 139 131* 132*  K 3.4* 4.0 3.8  CL 109 100* 101  CO2 20* 22 22  GLUCOSE 62* 140* 178*  BUN 19 11 12   CREATININE 1.42* 1.17 1.34*  CALCIUM 8.3* 8.6* 8.5*  MG 1.7 2.2  --   PHOS 1.9* 4.1  --     Lipid Panel:    Component Value Date/Time   CHOL 139 10/18/2016 1149   TRIG 161.0 (H) 10/18/2016 1149   HDL 32.90 (L) 10/18/2016 1149   CHOLHDL 4 10/18/2016 1149   VLDL 32.2 10/18/2016 1149   LDLCALC 73 10/18/2016 1149   HgbA1c:  Lab Results  Component Value Date   HGBA1C 7.2 (H) 03/05/2017   Urine Drug Screen: No results found for: LABOPIA, COCAINSCRNUR, LABBENZ, AMPHETMU, THCU, LABBARB  Alcohol Level No results found for: ETH  IMAGING  Ct Angio Head and Neck W and Wo Contrast 04/15/2017 IMPRESSION: 1. No emergent large vessel occlusion or high-grade stenosis. 2. Mild right carotid bifurcation atherosclerosis without hemodynamically significant stenosis. Normal cervical left carotid system. Mild atherosclerotic calcification of the intracranial internal carotid arteries at the skullbase. 3. At least moderate narrowing of the origin of the nondominant right vertebral artery. 4.  Aortic Atherosclerosis (ICD10-I70.0). 5. Bilateral pleural effusions, incompletely visualized.  Ct Head Code Stroke Wo Contrast 04/15/2017 IMPRESSION: 1. No acute hemorrhage or mass lesion. 2. Generalized atrophy; multiple old, small infarcts and findings of chronic microvascular ischemia. 3. ASPECTS is 10.  2D Echo 04/13/2017 Study Conclusions  - Left ventricle: The cavity size was normal. There was mild concentric hypertrophy. Systolic function was normal. The estimated ejection fraction was in the range of 55% to 60%.  Features are consistent with a pseudonormal left ventricular filling pattern, with concomitant abnormal relaxation and increased filling pressure (grade 2 diastolic  dysfunction).  Doppler parameters are consistent with both elevated ventricular end-diastolic filling pressure and elevated left atrial filling pressure. - Aortic valve: Mildly calcified annulus. Trileaflet; moderately calcified leaflets. There was mild stenosis. Mean gradient (S): 11 mm Hg. Valve area (VTI): 1.9 cm^2. Valve area (Vmax): 1.56 cm^2. Valve area (Vmean): 1.7 cm^2. - Mitral valve: Severely calcified annulus. Transvalvular  velocity was minimally increased. The findings are consistent with mild stenosis. There was mild regurgitation. Mean gradient (D): 6 mmHg. Valve area by pressure half-time: 2.47 cm^2. Valve area by continuity equation (using LVOT flow): 1.36 cm^2. - Left atrium: The atrium was moderately dilated. - Atrial septum: No defect or patent foramen ovale was identified. - Pulmonary arteries: PA peak pressure: 38 mm Hg (S). - Inferior vena cava: The vessel was normal in size. The   respirophasic diameter changes were blunted (< 50%), consistent   with elevated central venous pressure. Impressions: - The right ventricular systolic pressure was increased consistent with mild pulmonary hypertension.    PHYSICAL EXAM Pleasant elderly Caucasian male currently not in distress. . Afebrile. Head is nontraumatic. Neck is supple without bruit.    Cardiac exam no murmur or gallop. Lungs are clear to auscultation. Distal pulses are well felt. Neurological Exam :  Awake alert oriented 3. Diminished attention, registration and recall. Speech is fluent without aphasia or dysarthria. Follows one and occasional two-step commands. Extraocular moments are full range without nystagmus. Right gaze preference but can look to the left. Dense left homonymous hemianopsia. Face is symmetric. Tongue midline. Motor system exam mild left hemiparesis 4/5 strength with arm weaker than leg. Weakness of left grip and intrinsic hand muscles. Orbits right over left upper extremity. Slight diminished sensation in the left upper extremity. Deep tendon reflexes are symmetric. Plantars are downgoing. Gait was not tested. Coordination is impaired in the left arm compared to the right. ASSESSMENT/PLAN Mr. Gerald Holt. is a 83 y.o. male with history of  history of progressive dementia and paroxysmal Afib who was admitted on 7/21 for management of atrial flutter with RVR after initially presenting with a c/c of severe generalized  weakness.  He did not receive IV t-PA due to being on oral anticoagulation.   Stroke: No primary lesion identified on Ct imaging, but suspect small right posterior cerebral artery territory infarct likely cardioembolic due to history of atrial tacycardia with Eliquis recently switched to aspirin after fall.  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 No source of embolus   LDL 73  HgbA1c 7.3  SCDs for VTE prophylaxis  Diet NPO time specified  aspirin 81 mg daily prior to admission, now on Eliquis (apixaban) daily and No antithrombotic  Patient counseled to be compliant with his antithrombotic medications  Ongoing aggressive stroke risk factor management  Therapy recommendations:  SNF  Disposition:   pending  Hypertension  Stable  Permissive hypertension (OK if < 220/120) but gradually normalize in 5-7 days  Long-term BP goal normotensive  Hyperlipidemia  Home meds: none  LDL 73, goal < 70  Diabetes  HgbA1c 7.3, goal < 7.0  Uncontrolled  Other Stroke Risk Factors  Advanced age  Hx stroke/TIA  Other Active Problems  None  Hospital day # 4  I have personally examined this patient, reviewed notes, independently viewed imaging studies, participated in medical decision making  and plan of care.ROS completed by me personally and pertinent positives fully documented  I have made any additions or clarifications directly to the above note. He has presented with left-sided visual field defect and mild left hemiparesis due to suspected right brain infarct etiology cardioembolic from atrial fibrillation. He was not on anticoagulation given prior history of fall risk. Recommend eliquis for anticoagulation and check MRI scan of the brain. Long discussion at the bedside with the patient and caregiver and answered questions. Greater than 50%  time during this 25 minute visit was spent on counseling incoordination of care about his embolic stroke, vision loss, atrial fibrillation treatment, stroke prevention discussion and answering questions . Antony Contras, MD Medical Director [facility] Pager: [phone] 04/15/2017 5:15 PM   To contact Stroke Continuity provider, please refer to [email or web address] After hours, contact General Neurology

## 2017-04-15 NOTE — Progress Notes (Addendum)
RN called for new onset of Left side weakness and Left side facial droop. On arrival Dr. Earnestine Leys and Dr. Hal Hope at bedside. NIH 7. Pt transported to ct, code stroke cancelled per Dr. Cheral Marker

## 2017-04-15 NOTE — Clinical Social Work Note (Signed)
Clinical Social Work Assessment  Patient Details  Name: Gerald Holt. MRN: [id number] Date of Birth: [birth year]  Date of referral:  04/15/17               Reason for consult:  Facility Placement                Permission sought to share information with:  Family Supports, Chartered certified accountant granted to share information::  Yes, Verbal Permission Granted  Name::     Jakiah Goree   Agency::  [facility]  Relationship::  Spouse   Contact Information:  [phone]  Housing/Transportation Living arrangements for the past 2 months:  [place] Information:  Patient, Spouse Patient Interpreter Needed:  None Criminal Activity/Legal Involvement Pertinent to Current Situation/Hospitalization:  No - Comment as needed Significant Relationships:  Spouse Lives with:  Spouse Do you feel safe going back to the place where you live?  Yes Need for family participation in patient care:  No (Coment)  Care giving concerns:  Patient is from home with spouse and home health caregiver. Caregiver is there 9a-5p. PT recommends SNF.    Social Worker assessment / plan: CSW met with patient and home health caregiver, Sharyn Lull, at bedside, and spoke to wife via phone. Patient and wife agreeable to SNF and prefer facility in [facility] or [place]. CSW faxed out referral and will follow to assist with placement.   Employment status:  Retired Forensic scientist:  Medicare PT Recommendations:  [place] / Referral to community resources:  [place]  Patient/Family's Response to care: Patient and wife appreciative of care.  Patient/Family's Understanding of and Emotional Response to Diagnosis, Current Treatment, and Prognosis: Patient and wife with understanding of medical conditions and hopeful for mobility recovery at SNF.  Emotional Assessment Appearance:  Appears stated age Attitude/Demeanor/Rapport:   Other (appropriate) Affect (typically observed):  Appropriate, Calm Orientation:  Oriented to Self, Oriented to Place, Oriented to Situation Alcohol / Substance use:  Not Applicable Psych involvement (Current and /or in the community):  No (Comment)  Discharge Needs  Concerns to be addressed:  Discharge Planning Concerns Readmission within the last 30 days:  No Current discharge risk:  Physical Impairment Barriers to Discharge:  Continued Medical Work up   Estanislado Emms, LCSW 04/15/2017, 12:17 PM

## 2017-04-15 NOTE — Consult Note (Signed)
Referring Physician: Dr. Wynetta Emery    Chief Complaint: Acute onset of left sided weakness  HPI: Gerald Holt. is an 83 y.o. male with progressive dementia and paroxysmal Afib who was admitted on 7/21 for management of atrial flutter with RVR after initially presenting with a c/c of severe generalized weakness. He has not been on anticoagulation due to falls, but was started on Eliquis 7/23.    Late Monday night, he was noted by nursing to be manifesting acute left sided weakness with facial droop. LKN was 10:20 PM. Emergent Neurological evaluation also revealed a left visual field cut. NIHSS was 7.   An emergent CTA of head and neck was obtained revealing no LVO and no intracranial hemorrhage.  LSN: 10:20 PM tPA Given: No: On oral anticoagulation.   Past Medical History:  Diagnosis Date  . A-fib ([place])   . C. difficile diarrhea   . Cataracts, bilateral   . Chronic dermatitis   . Diabetes type 2, controlled ([place]) 2000  . History of chicken pox   . Mumps    Adult  . Retinopathy   . Shingles   . Stroke [facility]) Nov 2011  . Undescended testicle, unilateral    Right  . Whooping cough     Past Surgical History:  Procedure Laterality Date  . CARDIAC VALVE REPLACEMENT    . PRE-MALIGNANT / BENIGN SKIN LESION EXCISION    . TESTICLE SURGERY     Right, undescended  . TOOTH EXTRACTION      Family History  Problem Relation Age of Onset  . Pneumonia Mother 75       Deceased  . GI Bleed Father 51       Deceased - Ulcers  . Diabetes Cousin   . Diabetes Brother   . Cancer Paternal Aunt    Social History:  reports that he has quit smoking. He has never used smokeless tobacco. He reports that he does not drink alcohol or use drugs.  Allergies:  Allergies  Allergen Reactions  . Flomax [Tamsulosin Hcl] Other (See Comments)    Hypotension  . Shellfish Allergy Anaphylaxis  . Aspirin-Dipyridamole Er Other (See Comments)    Headaches   . Other Other (See Comments)    Blood  Thinner given in Rehab gave H/As - Not Coumadin/Headaches      Medications:  Scheduled: . amiodarone  200 mg Oral BID  . apixaban  2.5 mg Oral BID  . diltiazem  240 mg Oral Daily  . doxycycline  100 mg Oral Q12H  . fluconazole  100 mg Oral Daily  . insulin aspart  0-5 Units Subcutaneous QHS  . insulin aspart  0-9 Units Subcutaneous TID WC  . iopamidol      . levothyroxine  50 mcg Oral QAC breakfast  . metoprolol tartrate  12.5 mg Oral BID  . pravastatin  10 mg Oral q1800  . vancomycin  250 mg Oral Q6H   Continuous: . 0.9 % NaCl with KCl 20 mEq / L 75 mL/hr at 04/15/17 0226   HTD:SKAJGOTLXB tartrate, ondansetron (ZOFRAN) IV  ROS: No headache or chest pain. No limb pain. Other ROS as per HPI.   Physical Examination: Blood pressure (!) 142/71, pulse 64, temperature 98 F (36.7 C), resp. rate 16, height '5\' 6"'  (1.676 m), weight 72.1 kg (158 lb 15.2 oz), SpO2 95 %.  HEENT: [place]/AT Lungs: Respirations unlabored Ext: Warm and well perfused  Neurologic Examination: Mental Status: Awake and alert. Able to answer simple questions fluently.  Paucity of ideas noted. Able to follow all simple motor commands. Basic orientation intact. Able to name thumb and pinky fingers.  Cranial Nerves: II:  Left vusual field cut. PERRL.  III,IV, VI: ptosis not present, EOM are full with saccadic visual pursuits noted and some hesitancy with leftward gaze V,VII: smile symmetric, facial fine touch sensation normal bilaterally VIII: HOH IX,X: no hypophonia or hoarseness XI: symmetric XII: midline tongue extension  Motor: LUE and LLE 4/5 with bradykinesia RUE and RLE 5/5 Sensory: Light touch intact in all 4 extremities without extinction Deep Tendon Reflexes:  Hypoactive x 4 Plantars: Equivocal bilaterally Cerebellar: Mild ataxia with left FNF and left foot tapping.  Gait: Deferred  Results for orders placed or performed during the hospital encounter of 04/11/17 (from the past 48 hour(s))   Glucose, capillary     Status: Abnormal   Collection Time: 04/13/17  5:05 AM  Result Value Ref Range   Glucose-Capillary 139 (H) 65 - 99 mg/dL   Comment 1 Notify RN   Glucose, capillary     Status: Abnormal   Collection Time: 04/13/17  7:44 AM  Result Value Ref Range   Glucose-Capillary 139 (H) 65 - 99 mg/dL   Comment 1 Capillary Specimen    Comment 2 Notify RN   Glucose, capillary     Status: Abnormal   Collection Time: 04/13/17 12:50 PM  Result Value Ref Range   Glucose-Capillary 123 (H) 65 - 99 mg/dL  Glucose, capillary     Status: Abnormal   Collection Time: 04/13/17  4:05 PM  Result Value Ref Range   Glucose-Capillary 207 (H) 65 - 99 mg/dL   Comment 1 Capillary Specimen    Comment 2 Notify RN   Glucose, capillary     Status: Abnormal   Collection Time: 04/13/17 11:40 PM  Result Value Ref Range   Glucose-Capillary 115 (H) 65 - 99 mg/dL  Glucose, capillary     Status: Abnormal   Collection Time: 04/14/17 12:19 AM  Result Value Ref Range   Glucose-Capillary 132 (H) 65 - 99 mg/dL  Procalcitonin     Status: None   Collection Time: 04/14/17  3:02 AM  Result Value Ref Range   Procalcitonin 0.12 ng/mL    Comment:        Interpretation: PCT (Procalcitonin) <= 0.5 ng/mL: Systemic infection (sepsis) is not likely. Local bacterial infection is possible. (NOTE)         ICU PCT Algorithm               Non ICU PCT Algorithm    ----------------------------     ------------------------------         PCT < 0.25 ng/mL                 PCT < 0.1 ng/mL     Stopping of antibiotics            Stopping of antibiotics       strongly encouraged.               strongly encouraged.    ----------------------------     ------------------------------       PCT level decrease by               PCT < 0.25 ng/mL       >= 80% from peak PCT       OR PCT 0.25 - 0.5 ng/mL          Stopping of antibiotics  encouraged.     Stopping of antibiotics            encouraged.    ----------------------------     ------------------------------       PCT level decrease by              PCT >= 0.25 ng/mL       < 80% from peak PCT        AND PCT >= 0.5 ng/mL            Continuin g antibiotics                                              encouraged.       Continuing antibiotics            encouraged.    ----------------------------     ------------------------------     PCT level increase compared          PCT > 0.5 ng/mL         with peak PCT AND          PCT >= 0.5 ng/mL             Escalation of antibiotics                                          strongly encouraged.      Escalation of antibiotics        strongly encouraged.   Comprehensive metabolic panel     Status: Abnormal   Collection Time: 04/14/17  3:02 AM  Result Value Ref Range   Sodium 132 (L) 135 - 145 mmol/L   Potassium 3.8 3.5 - 5.1 mmol/L   Chloride 101 101 - 111 mmol/L   CO2 22 22 - 32 mmol/L   Glucose, Bld 178 (H) 65 - 99 mg/dL   BUN 12 6 - 20 mg/dL   Creatinine, Ser 1.34 (H) 0.61 - 1.24 mg/dL   Calcium 8.5 (L) 8.9 - 10.3 mg/dL   Total Protein 5.7 (L) 6.5 - 8.1 g/dL   Albumin 2.5 (L) 3.5 - 5.0 g/dL   AST 16 15 - 41 U/L   ALT 13 (L) 17 - 63 U/L   Alkaline Phosphatase 56 38 - 126 U/L   Total Bilirubin 1.1 0.3 - 1.2 mg/dL   GFR calc non Af Amer 45 (L) >60 mL/min   GFR calc Af Amer 52 (L) >60 mL/min    Comment: (NOTE) The eGFR has been calculated using the CKD EPI equation. This calculation has not been validated in all clinical situations. eGFR's persistently <60 mL/min signify possible Chronic Kidney Disease.    Anion gap 9 5 - 15  CBC     Status: Abnormal   Collection Time: 04/14/17  3:02 AM  Result Value Ref Range   WBC 9.7 4.0 - 10.5 K/uL   RBC 3.45 (L) 4.22 - 5.81 MIL/uL   Hemoglobin 10.0 (L) 13.0 - 17.0 g/dL   HCT 28.7 (L) 39.0 - 52.0 %   MCV 83.2 78.0 - 100.0 fL   MCH 29.0 26.0 - 34.0 pg   MCHC 34.8 30.0 - 36.0 g/dL   RDW 14.5 11.5 - 15.5 %   Platelets 258  150 - 400 K/uL  Glucose,  capillary     Status: Abnormal   Collection Time: 04/14/17  7:15 AM  Result Value Ref Range   Glucose-Capillary 176 (H) 65 - 99 mg/dL  Glucose, capillary     Status: Abnormal   Collection Time: 04/14/17 11:34 AM  Result Value Ref Range   Glucose-Capillary 207 (H) 65 - 99 mg/dL  Glucose, capillary     Status: Abnormal   Collection Time: 04/14/17  4:32 PM  Result Value Ref Range   Glucose-Capillary 157 (H) 65 - 99 mg/dL  Glucose, capillary     Status: Abnormal   Collection Time: 04/14/17  9:21 PM  Result Value Ref Range   Glucose-Capillary 193 (H) 65 - 99 mg/dL  Glucose, capillary     Status: Abnormal   Collection Time: 04/15/17 12:20 AM  Result Value Ref Range   Glucose-Capillary 201 (H) 65 - 99 mg/dL   Comment 1 Document in Chart    Ct Angio Head W Or Wo Contrast  Result Date: 04/15/2017 CLINICAL DATA:  Left-sided weakness EXAM: CT ANGIOGRAPHY HEAD AND NECK TECHNIQUE: Multidetector CT imaging of the head and neck was performed using the standard protocol during bolus administration of intravenous contrast. Multiplanar CT image reconstructions and MIPs were obtained to evaluate the vascular anatomy. Carotid stenosis measurements (when applicable) are obtained utilizing NASCET criteria, using the distal internal carotid diameter as the denominator. CONTRAST:  50 mL Isovue 370 COMPARISON:  Head CT 05/30/2016 Brain MRI 06/08/2016 FINDINGS: CTA NECK FINDINGS Aortic arch: There is no aneurysm or dissection of the visualized ascending aorta or aortic arch. There is a normal 3 vessel branching pattern. The visualized proximal subclavian arteries are widely patent. There is atherosclerotic plaque and calcification within the aortic arch. Right carotid system: The right common carotid origin is widely patent. There is no common carotid or internal carotid artery dissection or aneurysm. There is mixed calcified and noncalcified atherosclerosis at the carotid bifurcation without  hemodynamically significant stenosis. Left carotid system: The left common carotid origin is widely patent. There is no common carotid or internal carotid artery dissection or aneurysm. No hemodynamically significant stenosis. Vertebral arteries: The vertebral system is left dominant. At least moderate narrowing of the right vertebral artery origin secondary to atherosclerotic calcification and noncalcified plaque. Normal left vertebral artery origin. Diminutive right V4 segment distal to the origin of the right posterior inferior cerebellar artery. Both vertebral arteries are otherwise normal to their confluence with the basilar artery. Skeleton: There is no bony spinal canal stenosis. No lytic or blastic lesions. Other neck: The nasopharynx is clear. The oropharynx and hypopharynx are normal. The epiglottis is normal. The supraglottic larynx, glottis and subglottic larynx are normal. No retropharyngeal collection. The parapharyngeal spaces are preserved. The parotid and submandibular glands are normal. No sialolithiasis or salivary ductal dilatation. The thyroid gland is normal. There is no cervical lymphadenopathy. Upper chest: Bilateral pleural effusions are at least medium-sized, but incompletely imaged. Review of the MIP images confirms the above findings CTA HEAD FINDINGS Anterior circulation: --Intracranial internal carotid arteries: Mild atherosclerotic calcification bilaterally without hemodynamically significant stenosis. --Anterior cerebral arteries: Congenital variant diminutive left A1 segment. Otherwise normal --Middle cerebral arteries: Normal. --Posterior communicating arteries: Present on the left. Posterior circulation: --Posterior cerebral arteries: Fetal origin of the left PCA. Otherwise normal. --Superior cerebellar arteries: Normal. --Basilar artery: Normal. --Anterior inferior cerebellar arteries: Normal. --Posterior inferior cerebellar arteries: Normal. Venous sinuses: As permitted by  contrast timing, patent. Anatomic variants: Fetal origin of the left posterior cerebral artery. Hypoplastic left  anterior cerebral artery A1 segment. Delayed phase: Not performed Review of the MIP images confirms the above findings IMPRESSION: 1. No emergent large vessel occlusion or high-grade stenosis. 2. Mild right carotid bifurcation atherosclerosis without hemodynamically significant stenosis. Normal cervical left carotid system. Mild atherosclerotic calcification of the intracranial internal carotid arteries at the skullbase. 3. At least moderate narrowing of the origin of the nondominant right vertebral artery. 4.  Aortic Atherosclerosis (ICD10-I70.0). 5. Bilateral pleural effusions, incompletely visualized. These results were called by telephone at the time of interpretation on 04/15/2017 at 12:55 am to Dr. Gean Birchwood , who verbally acknowledged these results. Electronically Signed   By: Ulyses Jarred M.D.   On: 04/15/2017 01:08   Ct Angio Neck W Or Wo Contrast  Result Date: 04/15/2017 CLINICAL DATA:  Left-sided weakness EXAM: CT ANGIOGRAPHY HEAD AND NECK TECHNIQUE: Multidetector CT imaging of the head and neck was performed using the standard protocol during bolus administration of intravenous contrast. Multiplanar CT image reconstructions and MIPs were obtained to evaluate the vascular anatomy. Carotid stenosis measurements (when applicable) are obtained utilizing NASCET criteria, using the distal internal carotid diameter as the denominator. CONTRAST:  50 mL Isovue 370 COMPARISON:  Head CT 05/30/2016 Brain MRI 06/08/2016 FINDINGS: CTA NECK FINDINGS Aortic arch: There is no aneurysm or dissection of the visualized ascending aorta or aortic arch. There is a normal 3 vessel branching pattern. The visualized proximal subclavian arteries are widely patent. There is atherosclerotic plaque and calcification within the aortic arch. Right carotid system: The right common carotid origin is widely patent.  There is no common carotid or internal carotid artery dissection or aneurysm. There is mixed calcified and noncalcified atherosclerosis at the carotid bifurcation without hemodynamically significant stenosis. Left carotid system: The left common carotid origin is widely patent. There is no common carotid or internal carotid artery dissection or aneurysm. No hemodynamically significant stenosis. Vertebral arteries: The vertebral system is left dominant. At least moderate narrowing of the right vertebral artery origin secondary to atherosclerotic calcification and noncalcified plaque. Normal left vertebral artery origin. Diminutive right V4 segment distal to the origin of the right posterior inferior cerebellar artery. Both vertebral arteries are otherwise normal to their confluence with the basilar artery. Skeleton: There is no bony spinal canal stenosis. No lytic or blastic lesions. Other neck: The nasopharynx is clear. The oropharynx and hypopharynx are normal. The epiglottis is normal. The supraglottic larynx, glottis and subglottic larynx are normal. No retropharyngeal collection. The parapharyngeal spaces are preserved. The parotid and submandibular glands are normal. No sialolithiasis or salivary ductal dilatation. The thyroid gland is normal. There is no cervical lymphadenopathy. Upper chest: Bilateral pleural effusions are at least medium-sized, but incompletely imaged. Review of the MIP images confirms the above findings CTA HEAD FINDINGS Anterior circulation: --Intracranial internal carotid arteries: Mild atherosclerotic calcification bilaterally without hemodynamically significant stenosis. --Anterior cerebral arteries: Congenital variant diminutive left A1 segment. Otherwise normal --Middle cerebral arteries: Normal. --Posterior communicating arteries: Present on the left. Posterior circulation: --Posterior cerebral arteries: Fetal origin of the left PCA. Otherwise normal. --Superior cerebellar arteries:  Normal. --Basilar artery: Normal. --Anterior inferior cerebellar arteries: Normal. --Posterior inferior cerebellar arteries: Normal. Venous sinuses: As permitted by contrast timing, patent. Anatomic variants: Fetal origin of the left posterior cerebral artery. Hypoplastic left anterior cerebral artery A1 segment. Delayed phase: Not performed Review of the MIP images confirms the above findings IMPRESSION: 1. No emergent large vessel occlusion or high-grade stenosis. 2. Mild right carotid bifurcation atherosclerosis without hemodynamically significant stenosis. Normal  cervical left carotid system. Mild atherosclerotic calcification of the intracranial internal carotid arteries at the skullbase. 3. At least moderate narrowing of the origin of the nondominant right vertebral artery. 4.  Aortic Atherosclerosis (ICD10-I70.0). 5. Bilateral pleural effusions, incompletely visualized. These results were called by telephone at the time of interpretation on 04/15/2017 at 12:55 am to Dr. Gean Birchwood , who verbally acknowledged these results. Electronically Signed   By: Ulyses Jarred M.D.   On: 04/15/2017 01:08   Dg Chest Port 1 View  Result Date: 04/13/2017 CLINICAL DATA:  Acute respiratory failure. EXAM: PORTABLE CHEST 1 VIEW COMPARISON:  04/11/2017 FINDINGS: Lungs are hypoinflated with bibasilar opacification left greater than right without significant change which may be due to atelectasis or infection. Stable cardiomegaly. Calcified plaque over the aortic arch. Degenerative change of the spine. IMPRESSION: Persistent bibasilar opacification which may be due to atelectasis or infection. Stable cardiomegaly. Aortic Atherosclerosis (ICD10-I70.0). Electronically Signed   By: Marin Olp M.D.   On: 04/13/2017 07:21   Ct Head Code Stroke Wo Contrast  Result Date: 04/15/2017 CLINICAL DATA:  Code stroke.  Left-sided weakness EXAM: CT HEAD WITHOUT CONTRAST TECHNIQUE: Contiguous axial images were obtained from the  base of the skull through the vertex without intravenous contrast. COMPARISON:  None. FINDINGS: Brain: No mass lesion or acute hemorrhage. No focal hypoattenuation of the basal ganglia or cortex to indicate infarcted tissue. There is generalized volume loss. There is periventricular hypoattenuation compatible with chronic microvascular disease. Old bilateral cerebellar infarcts and centrum semiovale lacunar infarcts. Vascular: No hyperdense vessel. Atherosclerotic calcification of the vertebral and internal carotid arteries at the skull base. Skull: Normal visualized skull base, calvarium and extracranial soft tissues. Sinuses/Orbits: No sinus fluid levels or advanced mucosal thickening. No mastoid effusion. Normal orbits. ASPECTS [facility] Stroke Program Early CT Score) - Ganglionic level infarction (caudate, lentiform nuclei, internal capsule, insula, M1-M3 cortex): 7 - Supraganglionic infarction (M4-M6 cortex): 3 Total score (0-10 with 10 being normal): 10 IMPRESSION: 1. No acute hemorrhage or mass lesion. 2. Generalized atrophy; multiple old, small infarcts and findings of chronic microvascular ischemia. 3. ASPECTS is 10. These results were called by telephone at the time of interpretation on 04/15/2017 at 12:53 am to Dr. Kerney Elbe , who verbally acknowledged these results. Electronically Signed   By: Ulyses Jarred M.D.   On: 04/15/2017 00:54    Assessment: 83 y.o. male with acute onset of left hemiparesis, left facial droop and left visual field cut 1. CT shows no acute hemorrhage or mass lesion. There is generalized atrophy; multiple old, small infarcts and findings of chronic microvascular ischemia.  2. CTA shows no LVO. There is mild right carotid bifurcation atherosclerosis without hemodynamically significant stenosis. Normal cervical left carotid system. Mild atherosclerotic calcification of the intracranial internal carotid arteries at the skullbase. At least moderate narrowing of the origin of the  nondominant right vertebral artery. 3. Atrial fibrillation, started on Eliquis 7/23 4. Stroke Risk Factors - DM2, prior stroke, atrial fibrillation  Plan: 1. HgbA1c, fasting lipid panel 2. MRI of the brain without contrast 3. PT consult, OT consult, Speech consult 4. Echocardiogram 5. Continue Eliquis 6. Continue pravastatin 7. Frequent neuro checks 8. Telemetry monitoring 9. Modified permissive HTN protocol for 24 hours with goal SBP < 180   '@Electronically'  signed: Dr. Kerney Elbe  04/15/2017, 4:23 AM

## 2017-04-16 ENCOUNTER — Ambulatory Visit: Payer: Self-pay | Admitting: Physician Assistant

## 2017-04-16 ENCOUNTER — Inpatient Hospital Stay (HOSPITAL_COMMUNITY): Payer: Medicare Other

## 2017-04-16 DIAGNOSIS — F0390 Unspecified dementia without behavioral disturbance: Secondary | ICD-10-CM | POA: Diagnosis not present

## 2017-04-16 DIAGNOSIS — E119 Type 2 diabetes mellitus without complications: Secondary | ICD-10-CM | POA: Diagnosis not present

## 2017-04-16 DIAGNOSIS — L03032 Cellulitis of left toe: Secondary | ICD-10-CM | POA: Diagnosis not present

## 2017-04-16 DIAGNOSIS — I5032 Chronic diastolic (congestive) heart failure: Secondary | ICD-10-CM | POA: Diagnosis not present

## 2017-04-16 DIAGNOSIS — B9689 Other specified bacterial agents as the cause of diseases classified elsewhere: Secondary | ICD-10-CM | POA: Diagnosis not present

## 2017-04-16 DIAGNOSIS — R630 Anorexia: Secondary | ICD-10-CM | POA: Diagnosis not present

## 2017-04-16 DIAGNOSIS — I6930 Unspecified sequelae of cerebral infarction: Secondary | ICD-10-CM | POA: Diagnosis not present

## 2017-04-16 DIAGNOSIS — I482 Chronic atrial fibrillation: Secondary | ICD-10-CM | POA: Diagnosis not present

## 2017-04-16 DIAGNOSIS — I69328 Other speech and language deficits following cerebral infarction: Secondary | ICD-10-CM | POA: Diagnosis not present

## 2017-04-16 DIAGNOSIS — D72829 Elevated white blood cell count, unspecified: Secondary | ICD-10-CM | POA: Diagnosis not present

## 2017-04-16 DIAGNOSIS — N401 Enlarged prostate with lower urinary tract symptoms: Secondary | ICD-10-CM | POA: Diagnosis not present

## 2017-04-16 DIAGNOSIS — I10 Essential (primary) hypertension: Secondary | ICD-10-CM | POA: Diagnosis not present

## 2017-04-16 DIAGNOSIS — Z794 Long term (current) use of insulin: Secondary | ICD-10-CM | POA: Diagnosis not present

## 2017-04-16 DIAGNOSIS — I69392 Facial weakness following cerebral infarction: Secondary | ICD-10-CM | POA: Diagnosis not present

## 2017-04-16 DIAGNOSIS — R531 Weakness: Secondary | ICD-10-CM | POA: Diagnosis not present

## 2017-04-16 DIAGNOSIS — N390 Urinary tract infection, site not specified: Secondary | ICD-10-CM | POA: Diagnosis not present

## 2017-04-16 DIAGNOSIS — I999 Unspecified disorder of circulatory system: Secondary | ICD-10-CM | POA: Diagnosis not present

## 2017-04-16 DIAGNOSIS — I509 Heart failure, unspecified: Secondary | ICD-10-CM | POA: Diagnosis not present

## 2017-04-16 DIAGNOSIS — M1712 Unilateral primary osteoarthritis, left knee: Secondary | ICD-10-CM | POA: Diagnosis not present

## 2017-04-16 DIAGNOSIS — I69354 Hemiplegia and hemiparesis following cerebral infarction affecting left non-dominant side: Secondary | ICD-10-CM | POA: Diagnosis not present

## 2017-04-16 DIAGNOSIS — Z515 Encounter for palliative care: Secondary | ICD-10-CM | POA: Diagnosis not present

## 2017-04-16 DIAGNOSIS — I4891 Unspecified atrial fibrillation: Secondary | ICD-10-CM | POA: Diagnosis not present

## 2017-04-16 DIAGNOSIS — I38 Endocarditis, valve unspecified: Secondary | ICD-10-CM | POA: Diagnosis not present

## 2017-04-16 DIAGNOSIS — R000 Tachycardia, unspecified: Secondary | ICD-10-CM | POA: Diagnosis not present

## 2017-04-16 DIAGNOSIS — E1165 Type 2 diabetes mellitus with hyperglycemia: Secondary | ICD-10-CM | POA: Diagnosis not present

## 2017-04-16 DIAGNOSIS — M25562 Pain in left knee: Secondary | ICD-10-CM | POA: Diagnosis not present

## 2017-04-16 DIAGNOSIS — E118 Type 2 diabetes mellitus with unspecified complications: Secondary | ICD-10-CM | POA: Diagnosis not present

## 2017-04-16 DIAGNOSIS — F0150 Vascular dementia without behavioral disturbance: Secondary | ICD-10-CM | POA: Diagnosis not present

## 2017-04-16 DIAGNOSIS — I639 Cerebral infarction, unspecified: Secondary | ICD-10-CM | POA: Diagnosis not present

## 2017-04-16 DIAGNOSIS — A4189 Other specified sepsis: Secondary | ICD-10-CM | POA: Diagnosis not present

## 2017-04-16 DIAGNOSIS — N319 Neuromuscular dysfunction of bladder, unspecified: Secondary | ICD-10-CM | POA: Diagnosis not present

## 2017-04-16 DIAGNOSIS — I5033 Acute on chronic diastolic (congestive) heart failure: Secondary | ICD-10-CM | POA: Diagnosis not present

## 2017-04-16 DIAGNOSIS — Z87440 Personal history of urinary (tract) infections: Secondary | ICD-10-CM | POA: Diagnosis not present

## 2017-04-16 DIAGNOSIS — B351 Tinea unguium: Secondary | ICD-10-CM | POA: Diagnosis not present

## 2017-04-16 DIAGNOSIS — M79674 Pain in right toe(s): Secondary | ICD-10-CM | POA: Diagnosis not present

## 2017-04-16 DIAGNOSIS — N400 Enlarged prostate without lower urinary tract symptoms: Secondary | ICD-10-CM | POA: Diagnosis not present

## 2017-04-16 DIAGNOSIS — R8271 Bacteriuria: Secondary | ICD-10-CM | POA: Diagnosis not present

## 2017-04-16 DIAGNOSIS — R339 Retention of urine, unspecified: Secondary | ICD-10-CM | POA: Diagnosis not present

## 2017-04-16 DIAGNOSIS — I6789 Other cerebrovascular disease: Secondary | ICD-10-CM | POA: Diagnosis not present

## 2017-04-16 DIAGNOSIS — A0472 Enterocolitis due to Clostridium difficile, not specified as recurrent: Secondary | ICD-10-CM | POA: Diagnosis not present

## 2017-04-16 DIAGNOSIS — I63431 Cerebral infarction due to embolism of right posterior cerebral artery: Secondary | ICD-10-CM | POA: Diagnosis not present

## 2017-04-16 DIAGNOSIS — M25569 Pain in unspecified knee: Secondary | ICD-10-CM | POA: Diagnosis not present

## 2017-04-16 DIAGNOSIS — I499 Cardiac arrhythmia, unspecified: Secondary | ICD-10-CM | POA: Diagnosis not present

## 2017-04-16 DIAGNOSIS — E785 Hyperlipidemia, unspecified: Secondary | ICD-10-CM | POA: Diagnosis not present

## 2017-04-16 DIAGNOSIS — M79675 Pain in left toe(s): Secondary | ICD-10-CM | POA: Diagnosis not present

## 2017-04-16 DIAGNOSIS — I480 Paroxysmal atrial fibrillation: Secondary | ICD-10-CM | POA: Diagnosis not present

## 2017-04-16 DIAGNOSIS — E7800 Pure hypercholesterolemia, unspecified: Secondary | ICD-10-CM | POA: Diagnosis not present

## 2017-04-16 DIAGNOSIS — R748 Abnormal levels of other serum enzymes: Secondary | ICD-10-CM | POA: Diagnosis not present

## 2017-04-16 LAB — CBC
HEMATOCRIT: 30.4 % — AB (ref 39.0–52.0)
Hemoglobin: 10.1 g/dL — ABNORMAL LOW (ref 13.0–17.0)
MCH: 27.7 pg (ref 26.0–34.0)
MCHC: 33.2 g/dL (ref 30.0–36.0)
MCV: 83.3 fL (ref 78.0–100.0)
PLATELETS: 373 10*3/uL (ref 150–400)
RBC: 3.65 MIL/uL — ABNORMAL LOW (ref 4.22–5.81)
RDW: 14 % (ref 11.5–15.5)
WBC: 9.2 10*3/uL (ref 4.0–10.5)

## 2017-04-16 LAB — GLUCOSE, CAPILLARY
GLUCOSE-CAPILLARY: 171 mg/dL — AB (ref 65–99)
Glucose-Capillary: 184 mg/dL — ABNORMAL HIGH (ref 65–99)
Glucose-Capillary: 199 mg/dL — ABNORMAL HIGH (ref 65–99)

## 2017-04-16 LAB — BASIC METABOLIC PANEL
ANION GAP: 6 (ref 5–15)
BUN: 10 mg/dL (ref 6–20)
CALCIUM: 8.8 mg/dL — AB (ref 8.9–10.3)
CO2: 23 mmol/L (ref 22–32)
Chloride: 108 mmol/L (ref 101–111)
Creatinine, Ser: 1.23 mg/dL (ref 0.61–1.24)
GFR, EST AFRICAN AMERICAN: 57 mL/min — AB (ref >60)
GFR, EST NON AFRICAN AMERICAN: 49 mL/min — AB (ref >60)
Glucose, Bld: 165 mg/dL — ABNORMAL HIGH (ref 65–99)
Potassium: 4.5 mmol/L (ref 3.5–5.1)
Sodium: 137 mmol/L (ref 135–145)

## 2017-04-16 LAB — CULTURE, BLOOD (ROUTINE X 2)
CULTURE: NO GROWTH
CULTURE: NO GROWTH
SPECIAL REQUESTS: ADEQUATE
SPECIAL REQUESTS: ADEQUATE

## 2017-04-16 LAB — LIPID PANEL
CHOLESTEROL: 117 mg/dL (ref 0–200)
HDL: 23 mg/dL — AB (ref >40)
LDL Cholesterol: 72 mg/dL (ref 0–99)
TRIGLYCERIDES: 110 mg/dL (ref <150)
Total CHOL/HDL Ratio: 5.1 RATIO
VLDL: 22 mg/dL (ref 0–40)

## 2017-04-16 MED ORDER — VANCOMYCIN 50 MG/ML ORAL SOLUTION: 125.0000 mg | Freq: Four times a day (QID) | ORAL | Status: DC

## 2017-04-16 MED ORDER — METFORMIN HCL ER 500 MG PO TB24: ORAL_TABLET | ORAL | Status: DC

## 2017-04-16 MED ORDER — PRAVASTATIN SODIUM 10 MG PO TABS: 10.0000 mg | ORAL_TABLET | Freq: Every day | ORAL | Status: DC

## 2017-04-16 MED ORDER — AMIODARONE HCL 200 MG PO TABS: 200.0000 mg | ORAL_TABLET | Freq: Two times a day (BID) | ORAL | Status: DC

## 2017-04-16 MED ORDER — APIXABAN 5 MG PO TABS: 5.0000 mg | ORAL_TABLET | Freq: Two times a day (BID) | ORAL | Status: DC

## 2017-04-16 MED ORDER — DOXYCYCLINE HYCLATE 100 MG PO TABS: 100.0000 mg | ORAL_TABLET | Freq: Two times a day (BID) | ORAL | Status: DC

## 2017-04-16 MED ORDER — FLUCONAZOLE 100 MG PO TABS: 100.0000 mg | ORAL_TABLET | Freq: Every day | ORAL | Status: DC

## 2017-04-16 MED ORDER — AMIODARONE HCL 200 MG PO TABS
200.0000 mg | ORAL_TABLET | Freq: Every day | ORAL | Status: DC
Start: 2017-04-24 — End: 2017-05-07

## 2017-04-16 MED ORDER — DILTIAZEM HCL ER COATED BEADS 240 MG PO CP24: 240.0000 mg | ORAL_CAPSULE | Freq: Every day | ORAL | Status: DC

## 2017-04-16 NOTE — Progress Notes (Signed)
Patient will discharge to [facility] at [facility] Anticipated discharge date: 04/16/17 Family notified: [place] Rm Transportation by: [place] signing off.  Estanislado Emms, [place]  Clinical Social Worker

## 2017-04-16 NOTE — Progress Notes (Signed)
[place] for apixaban  Indication: atrial fibrillation  Allergies  Allergen Reactions  . Flomax [Tamsulosin Hcl] Other (See Comments)    Hypotension  . Shellfish Allergy Anaphylaxis  . Aspirin-Dipyridamole Er Other (See Comments)    Headaches   . Other Other (See Comments)    Blood Thinner given in Rehab gave H/As - Not Coumadin/Headaches      Patient Measurements: Height: 5\' 6"  (167.6 cm) Weight: 158 lb 12.8 oz (72 kg) IBW/kg (Calculated) : 63.8  Vital Signs: Temp: 98.5 F (36.9 C) (07/25 0645) Temp Source: Oral (07/25 0645) BP: 143/87 (07/25 0645) Pulse Rate: 63 (07/25 0945)  Labs:  Recent Labs  04/14/17 0302 04/15/17 1038 04/16/17 0300  HGB 10.0* 10.7* 10.1*  HCT 28.7* 31.6* 30.4*  PLT 258 321 373  CREATININE 1.34* 1.16 1.23    Estimated Creatinine Clearance: 35.3 mL/min (by C-G formula based on SCr of 1.23 mg/dL).   Medical History: Past Medical History:  Diagnosis Date  . A-fib ([place])   . C. difficile diarrhea   . Cataracts, bilateral   . Chronic dermatitis   . Diabetes type 2, controlled ([place]) 2000  . History of chicken pox   . Mumps    Adult  . Retinopathy   . Shingles   . Stroke [facility]) Nov 2011  . Undescended testicle, unilateral    Right  . Whooping cough     Assessment: 83 year old male with history of paf not currently on anticoagulation d/t concern for falls in the past. Patient has caregiver at home with him now and MD discussed resuming anticoagulation with family and they are in agreement at this time.   Dosing is a somewhat difficult decision, with history of falls and being admitted for near fall, age of 41, and fluctuating Scr. Consulted with Dr. Leonie Man and agreed to prescribe patient reduced dose apixaban at this time. He does only meet 1/3 criteria but I feel in the best interest of the patient at this time to continue with the lower dose.  Goal of Therapy:  Monitor platelets by  anticoagulation protocol: Yes   Plan:  Continue apixaban 2.5mg  bid   Pharmacy will sign off and follow peripherally.  Mila Merry Gerarda Fraction, PharmD PGY1 Pharmacy Resident Pager: [phone]  04/16/2017 1:52 PM   Remigio Eisenmenger D. Mina Marble, PharmD, BCPS Pager:  [phone] 04/16/2017, 2:15 PM

## 2017-04-16 NOTE — Progress Notes (Signed)
Attempted to call report to [place]. [place] staff was unsure which house patient would be admitted to. This RN was transferred to [employer], Education officer, museum at [employer]. Voice mail message was left; this RN has not yet received a call back.

## 2017-04-16 NOTE — Clinical Social Work Placement (Signed)
   CLINICAL SOCIAL WORK PLACEMENT  NOTE  Date:  04/16/2017  Patient Details  Name: Gerald Holt. MRN: [id number] Date of Birth: Oct 22, 1923  Clinical Social Work is seeking post-discharge placement for this patient at the [place] level of care (*CSW will initial, date and re-position this form in  chart as items are completed):  Yes   Patient/family provided with [place] Work Department's list of facilities offering this level of care within the geographic area requested by the patient (or if unable, by the patient's family).  Yes   Patient/family informed of their freedom to choose among providers that offer the needed level of care, that participate in Medicare, Medicaid or managed care program needed by the patient, have an available bed and are willing to accept the patient.  Yes   Patient/family informed of [facility]'s ownership interest in [facility] and [facility], as well as of the fact that they are under no obligation to receive care at these facilities.  PASRR submitted to EDS on       PASRR number received on       Existing PASRR number confirmed on 04/15/17     FL2 transmitted to all facilities in geographic area requested by pt/family on 04/15/17     FL2 transmitted to all facilities within larger geographic area on       Patient informed that his/her managed care company has contracts with or will negotiate with certain facilities, including the following:  [facility]     Yes   Patient/family informed of bed offers received.  Patient chooses bed at [facility] [place] recommends and patient chooses bed at      Patient to be transferred to [facility] at [place] on 04/16/17.  Patient to be transferred to facility by [facility]     Patient family notified on 04/16/17 of transfer.  Name of family member notified:  Makoa Satz, wife     PHYSICIAN       Additional Comment:     _______________________________________________ Estanislado Emms, LCSW 04/16/2017, 4:08 PM

## 2017-04-16 NOTE — Plan of Care (Signed)
Problem: Tissue Perfusion: Goal: Risk factors for ineffective tissue perfusion will decrease Outcome: Progressing MRI of head ordered. At beginning of shift, MRI called for pick up. Patient never brought down to MRI. RN spoke with department, patient due to be brought down within the next couple hours.   Problem: Activity: Goal: Risk for activity intolerance will decrease Outcome: Progressing Patient still having ataxia to left side. MRI of head due to be completed.   Problem: Fluid Volume: Goal: Ability to maintain a balanced intake and output will improve Outcome: Progressing Foley maintained due to chronic urinary retention. I & O's monitored.

## 2017-04-16 NOTE — Progress Notes (Signed)
STROKE TEAM PROGRESS NOTE   HISTORY OF PRESENT ILLNESS (per record) Gerald Tu. is an 83 y.o. male with a history of progressive dementia, type II diabetes mellitus, shingles, stroke in 2011, and paroxysmal Afib who was admitted on 7/21 for management of atrial flutter with RVR after initially presenting with a c/c of severe generalized weakness. He has not been on anticoagulation due to falls, but was started on Eliquis 7/23.  Late Monday night, he was noted by nursing to be manifesting acute left sided weakness with facial droop. LKN was 10:20 PM. Emergent Neurological evaluation also revealed a left visual field cut. NIHSS was 7.  An emergent CTA of head and neck was obtained revealing no LVO and no intracranial hemorrhage. LSN: 10:20 PM  Patient was not administered IV t-PA secondary to taking oral anticoagulation. He was admitted to 3W for further evaluation and treatment.   SUBJECTIVE (INTERVAL HISTORY) His  caregiver is at the bedside.He remains neurologically stable. No untoward events noted. MRI scan of the brain confirms a right PCA infarct  OBJECTIVE Temp:  [98 F (36.7 C)-98.5 F (36.9 C)] 98.5 F (36.9 C) (07/25 0645) Pulse Rate:  [61-63] 63 (07/25 0945) Cardiac Rhythm: Normal sinus rhythm (07/25 0800) BP: (143-150)/(51-87) 143/87 (07/25 0645) SpO2:  [94 %-95 %] 95 % (07/25 0645) Weight:  [158 lb 12.8 oz (72 kg)] 158 lb 12.8 oz (72 kg) (07/25 0645)  CBC:   Recent Labs Lab 04/15/17 1038 04/16/17 0300  WBC 7.3 9.2  HGB 10.7* 10.1*  HCT 31.6* 30.4*  MCV 84.0 83.3  PLT 321 102    Basic Metabolic Panel:  Recent Labs Lab 04/12/17 0058 04/13/17 0201  04/15/17 1038 04/16/17 0300  NA 139 131*  < > 134* 137  K 3.4* 4.0  < > 3.7 4.5  CL 109 100*  < > 106 108  CO2 20* 22  < > 20* 23  GLUCOSE 62* 140*  < > 184* 165*  BUN 19 11  < > 11 10  CREATININE 1.42* 1.17  < > 1.16 1.23  CALCIUM 8.3* 8.6*  < > 8.9 8.8*  MG 1.7 2.2  --   --   --   PHOS 1.9* 4.1   --   --   --   < > = values in this interval not displayed.  Lipid Panel:     Component Value Date/Time   CHOL 117 04/16/2017 0300   TRIG 110 04/16/2017 0300   HDL 23 (L) 04/16/2017 0300   CHOLHDL 5.1 04/16/2017 0300   VLDL 22 04/16/2017 0300   LDLCALC 72 04/16/2017 0300   HgbA1c:  Lab Results  Component Value Date   HGBA1C 7.2 (H) 03/05/2017   Urine Drug Screen: No results found for: LABOPIA, COCAINSCRNUR, LABBENZ, AMPHETMU, THCU, LABBARB  Alcohol Level No results found for: ETH  IMAGING  Ct Angio Head and Neck W and Wo Contrast 04/15/2017 IMPRESSION: 1. No emergent large vessel occlusion or high-grade stenosis. 2. Mild right carotid bifurcation atherosclerosis without hemodynamically significant stenosis. Normal cervical left carotid system. Mild atherosclerotic calcification of the intracranial internal carotid arteries at the skullbase. 3. At least moderate narrowing of the origin of the nondominant right vertebral artery. 4.  Aortic Atherosclerosis (ICD10-I70.0). 5. Bilateral pleural effusions, incompletely visualized.  Ct Head Code Stroke Wo Contrast 04/15/2017 IMPRESSION: 1. No acute hemorrhage or mass lesion. 2. Generalized atrophy; multiple old, small infarcts and findings of chronic microvascular ischemia. 3. ASPECTS is 10.  2D Echo 04/13/2017  Study Conclusions  - Left ventricle: The cavity size was normal. There was mild concentric hypertrophy. Systolic function was normal. The estimated ejection fraction was in the range of 55% to 60%.  Features are consistent with a pseudonormal left ventricular filling pattern, with concomitant abnormal relaxation and increased filling pressure (grade 2 diastolic dysfunction).  Doppler parameters are consistent with both elevated ventricular end-diastolic filling pressure and elevated left atrial filling pressure. - Aortic valve: Mildly calcified annulus. Trileaflet; moderately calcified leaflets. There was mild stenosis. Mean gradient  (S): 11 mm Hg. Valve area (VTI): 1.9 cm^2. Valve area (Vmax): 1.56 cm^2. Valve area (Vmean): 1.7 cm^2. - Mitral valve: Severely calcified annulus. Transvalvular velocity was minimally increased. The findings are consistent with mild stenosis. There was mild regurgitation. Mean gradient (D): 6 mmHg. Valve area by pressure half-time: 2.47 cm^2. Valve area by continuity equation (using LVOT flow): 1.36 cm^2. - Left atrium: The atrium was moderately dilated. - Atrial septum: No defect or patent foramen ovale was identified. - Pulmonary arteries: PA peak pressure: 38 mm Hg (S). - Inferior vena cava: The vessel was normal in size. The   respirophasic diameter changes were blunted (< 50%), consistent   with elevated central venous pressure. Impressions: - The right ventricular systolic pressure was increased consistent with mild pulmonary hypertension.    PHYSICAL EXAM Pleasant elderly Caucasian male currently not in distress. . Afebrile. Head is nontraumatic. Neck is supple without bruit.    Cardiac exam no murmur or gallop. Lungs are clear to auscultation. Distal pulses are well felt. Neurological Exam :  Awake alert oriented 3. Diminished attention, registration and recall. Speech is fluent without aphasia or dysarthria. Follows one and occasional two-step commands. Extraocular moments are full range without nystagmus. Right gaze preference but can look to the left. Dense left homonymous hemianopsia. Face is symmetric. Tongue midline. Motor system exam mild left hemiparesis 4/5 strength with arm weaker than leg. Weakness of left grip and intrinsic hand muscles. Orbits right over left upper extremity. Slight diminished sensation in the left upper extremity. Deep tendon reflexes are symmetric. Plantars are downgoing. Gait was not tested. Coordination is impaired in the left arm compared to the right. ASSESSMENT/PLAN Mr. Gerald Holt. is a 83 y.o. male with history of  history of  progressive dementia and paroxysmal Afib who was admitted on 7/21 for management of atrial flutter with RVR after initially presenting with a c/c of severe generalized weakness.  He did not receive IV t-PA due to being on oral anticoagulation.   Stroke: Embolic right posterior cerebral artery territory infarct likely cardioembolic due to history of atrial flutters/fibrillation with Eliquis recently switched to aspirin after fall.  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 No source of embolus   LDL 73  HgbA1c 7.3  SCDs for VTE prophylaxis Diet Carb Modified Fluid consistency: Thin; Room service appropriate? Yes  aspirin 81 mg daily prior to admission, now on Eliquis (apixaban) daily and No antithrombotic  Patient counseled to be compliant with his antithrombotic medications  Ongoing aggressive stroke risk factor management  Therapy recommendations:  SNF Disposition:   SNF Hypertension  Stable  Permissive hypertension (OK if < 220/120) but gradually normalize in 5-7 days  Long-term BP goal normotensive  Hyperlipidemia  Home meds: none  LDL 73,  goal < 70  Diabetes  HgbA1c 7.3, goal < 7.0  Uncontrolled  Other Stroke Risk Factors  Advanced age  Hx stroke/TIA  Other Active Problems  None  Hospital day # 5   . Continue eliquis for anticoagulation   Long discussion at the bedside with the patient and caregiver and answered questions.  Follow-up in the stroke clinic in 6 weeks. Stroke in his sign off. Kindly call for questions.  Antony Contras, MD Medical Director [facility] Pager: [phone] 04/16/2017 1:20 PM   To contact Stroke Continuity provider, please refer to [email or web address] After hours, contact General Neurology

## 2017-04-16 NOTE — Progress Notes (Signed)
Physical Therapy Treatment Patient Details Name: Gerald Holt. MRN: [id number] DOB: Feb 24, 1924 Today's Date: 04/16/2017    History of Present Illness 83 y/o male brought to hospital with 2 day hx of weakness and diarrhea and was found to be in atrial fib with rapid ventricular response. Pt now s/p rapid response due to facial droop and L sided weakness on night of 04/14/17.  MRI - Moderately large Rt PCA infarct.    PMH includes paroxysmal a-fib, DM, hyperlipidemia, hypothyroidism, dementia, and uses self cath for urniary retention.    PT Comments    Patient making some improvements with mobility and standing today.  Agree with need for SNF at d/c for continued therapy.   Follow Up Recommendations  SNF     Equipment Recommendations  None recommended by PT    Recommendations for Other Services       Precautions / Restrictions Precautions Precautions: Fall Precaution Comments: Lt hemiparesis Restrictions Weight Bearing Restrictions: No    Mobility  Bed Mobility Overal bed mobility: Needs Assistance Bed Mobility: Rolling;Sidelying to Sit;Sit to Sidelying Rolling: Min guard (toward Lt side) Sidelying to sit: Mod assist     Sit to sidelying: Mod assist General bed mobility comments: Patient able to roll to Lt side with RUE on rail.  Verbal cues not to roll onto LUE.  Mod assist to raise trunk to sitting position.  Once in sitting, patient leaning to Lt side.  Provided visual target for cues to remain at midline.  By end of session, patient able to maintain midline sitting position with min guard assist.  Patient required assist to bring LE's onto bed to return to sidelying.  Transfers Overall transfer level: Needs assistance Equipment used: Rolling walker (2 wheeled) Transfers: Sit to/from Stand Sit to Stand: Max assist;+2 physical assistance         General transfer comment: Initially patient attempted to stand prior to positioning feet, reaching for RW with  head down.  Encouraged patient to wait until PT prepared for standing.   Positioned and supported LLE in place.  Required +2 mod assist to move to standing x2.  Patient able to maintain upright position with mod assist, leaning toward Lt.  Worked on midline position with head up and hips extended.  During second attempt, patient able to self-correct midline position with verbal cues.  Cues to reach back for bed before sitting.  Ambulation/Gait             General Gait Details: NT today.   Stairs            Wheelchair Mobility    Modified Rankin (Stroke Patients Only) Modified Rankin (Stroke Patients Only) Pre-Morbid Rankin Score: Moderate disability Modified Rankin: Severe disability     Balance Overall balance assessment: Needs assistance Sitting-balance support: Single extremity supported;Feet supported Sitting balance-Leahy Scale: Poor Sitting balance - Comments: Requires UE support and external assist to maintain balance - leaning to left. Postural control: Left lateral lean Standing balance support: Bilateral upper extremity supported Standing balance-Leahy Scale: Poor Standing balance comment: reliant on clinicians and RW for stability.                             Cognition Arousal/Alertness: Awake/alert Behavior During Therapy: Flat affect;Impulsive Overall Cognitive Status: Impaired/Different from baseline Area of Impairment: Safety/judgement;Problem solving  Safety/Judgement: Decreased awareness of safety;Decreased awareness of deficits   Problem Solving: Difficulty sequencing;Requires verbal cues General Comments: Patient with Lt neglect. Unaware where LUE is located, keeps head turned toward Rt.  If you ask patient to turn head to Lt, he can do so and searches for you with his eyes.      Exercises      General Comments General comments (skin integrity, edema, etc.): Decreased control, coordination of Lt  UE/LE during mobility.      Pertinent Vitals/Pain Pain Assessment: No/denies pain    Home Living                      Prior Function            PT Goals (current goals can now be found in the care plan section) Acute Rehab PT Goals Patient Stated Goal: To walk PT Goal Formulation: With patient/family Time For Goal Achievement: 04/30/17 Potential to Achieve Goals: Good Progress towards PT goals: Goals downgraded-see care plan (Post- CVA)    Frequency    Min 3X/week      PT Plan Current plan remains appropriate    Co-evaluation              AM-PAC PT "6 Clicks" Daily Activity  Outcome Measure  Difficulty turning over in bed (including adjusting bedclothes, sheets and blankets)?: A Little Difficulty moving from lying on back to sitting on the side of the bed? : Total Difficulty sitting down on and standing up from a chair with arms (e.g., wheelchair, bedside commode, etc,.)?: Total Help needed moving to and from a bed to chair (including a wheelchair)?: Total Help needed walking in hospital room?: Total Help needed climbing 3-5 steps with a railing? : Total 6 Click Score: 8    End of Session Equipment Utilized During Treatment: Gait belt Activity Tolerance: Patient limited by fatigue Patient left: in bed;with call bell/phone within reach;with family/visitor present Nurse Communication: Mobility status PT Visit Diagnosis: Unsteadiness on feet (R26.81);Other abnormalities of gait and mobility (R26.89);Hemiplegia and hemiparesis Hemiplegia - Right/Left: Left Hemiplegia - dominant/non-dominant: Non-dominant Hemiplegia - caused by: Cerebral infarction     Time: 1355-1418 PT Time Calculation (min) (ACUTE ONLY): 23 min  Charges:  $Therapeutic Activity: 23-37 mins                    G Codes:       Carita Pian. Sanjuana Kava, [facility] Acute Rehab Services Pager [place] 04/16/2017, 3:43 PM

## 2017-04-16 NOTE — Discharge Summary (Signed)
Physician Discharge Summary  Gerald Holt. FYB:[id number] DOB: [birth year]  PCP: [name], PA-C  Admit date: 04/11/2017 Discharge date: 04/16/2017  Recommendations for Outpatient Follow-up:  1. M.D. at SNF in 3 days with repeat labs (CBC & BMP). [place], PA-C/PCP upon discharge from SNF. 3. Ambulatory referral to A. fib clinic was made prior to discharge. 4. Ambulatory referral to [facility] for stroke follow-up, in 4 weeks: Referral made prior to discharge. 5. Patient was doing in and out urinary catheterization every 6 hours at home and his spouse was assisting with same. Continue same at SNF.   Home Health:  None Equipment/Devices: None    Discharge Condition: Improved and stable CODE STATUS: DO NOT RESUSCITATE  Diet recommendation: Heart healthy and diabetic diet.  Discharge Diagnoses:  Principal Problem:   Atrial fibrillation with rapid ventricular response (HCC) Active Problems:   Urinary retention   C. difficile enteritis   Sepsis due to other etiology ([place])   Lactic acidosis   Atrial flutter with rapid ventricular response (HCC)   Elevated troponin   Acute on chronic diastolic CHF (congestive heart failure) (HCC)   Cerebral embolism with cerebral infarction   Chest pain   Tachycardia   Brief Summary: 83 year old male with PMH of progressive dementia, type II DM, shingles, stroke in 2011, paroxysmal A. fib taken off of Coumadin anticoagulation due to falls, C. difficile diarrhea, recent treatment for fungal UTI in the setting of frequent self-catheterization at home for neurogenic better, ongoing treatment for C. difficile diarrhea with oral vancomycin, presented on 04/12/17 with severe generalized weakness and noted to be in atrial flutter with RVR. Late on 04/14/17, noted by nursing to have acute stroke like symptoms including acute left-sided weakness and facial droop. Neurology was consulted and has completed stroke  workup.  Assessment and plan:  1. Acute stroke: Embolic right posterior cerebral artery territory infarct, likely cardioembolic due to history of atrial flutter/fibrillation in the context of Eliquis recently being switched to aspirin after fall. Neurology was consulted and stroke workup was completed. Resultant left homonymous hemianopsia and left hemiparesis. CT head: No acute stroke. MRI head: Right posterior cerebral artery infarct. MRA head: Not performed due to CTA done. Carotid Dopplers not performed due to CTA done. 2-D echo: LVEF 60-65 percent. LDL 73. A1c 7.3. Patient was on aspirin 81 MG daily prior to admission. Now on Eliquis and no antithrombotic. Patient will be going to SNF for rehabilitation. As per stroke MD I'll oh up today, continue anticoagulation as indicated above and outpatient follow-up with neurology in 6 weeks. 2. Essential hypertension: Permissive hypertension in the context of acute stroke. Normalize and 5-7 days. Mildly elevated blood pressures. 3. Hyperlipidemia: LDL 73, goal <70. Statin changed to pravastatin. 4. Type II DM: A1c: 7.3. Goal <7. Continue metformin at discharge with close CBG monitoring at SNF. 5. SIRS Vs Sepsis: Chest x-ray showed bibasilar pulmonary infiltrates versus atelectasis. Given severity of illness, he was treated with antibiotics and will complete a course of doxycycline on 04/18/17. Blood cultures 2 and urine culture negative. 6. Fungus balls in the urine: Patient's outpatient ID physician at [facility], Dr. Manuela Neptune started him on fluconazole 100 MG daily 14 days through 04/23/17. 7. C. difficile colitis: C. difficile screen negative. Stop vancomycin after 04/21/17 doses. 8. Paroxysmal A. flutter with RVR: Cardiology was consulted. As per their note, he has history of atrial fibrillation in the past as well as CVA. His Coumadin was stopped due to  several falls but he has not had any further falls in the past 2 years. He has a caregiver who takes  care of him around the clock. Patient converted to sinus rhythm on amiodarone. Cardiology recommended amiodarone 200 MG twice a day through 8/1 and then reduce to 200 MG daily beginning 8/2. This will be a 5 g load. Metoprolol was reduced to avoid bradycardia. Patient is on Cardizem as well. Anticoagulation was discussed by multiple physicians with spouse and patient and they were in agreement with continued anticoagulation. 9. Elevated troponin: Likely due to demand ischemia in the setting of atrial flutter with RVR. 10. Acute on chronic diastolic CHF: Clinically compensated and off of diuretics. Monitor closely. 11. Hypokalemia: Replaced. Magnesium normal.  12. Chronic anemia: Stable. 13. Acute kidney injury: Improved. Patient may have an element of chronic kidney disease. Monitor BMP closely at SNF.   Consultations:  Cardiology  Neurology  Procedures:  2-D echo 04/13/17: Study Conclusions  - Left ventricle: The cavity size was normal. There was mild   concentric hypertrophy. Systolic function was normal. The   estimated ejection fraction was in the range of 55% to 60%.   Features are consistent with a pseudonormal left ventricular   filling pattern, with concomitant abnormal relaxation and   increased filling pressure (grade 2 diastolic dysfunction).   Doppler parameters are consistent with both elevated ventricular   end-diastolic filling pressure and elevated left atrial filling   pressure. - Aortic valve: Mildly calcified annulus. Trileaflet; moderately   calcified leaflets. There was mild stenosis. Mean gradient (S):   11 mm Hg. Valve area (VTI): 1.9 cm^2. Valve area (Vmax): 1.56   cm^2. Valve area (Vmean): 1.7 cm^2. - Mitral valve: Severely calcified annulus. Transvalvular velocity   was minimally increased. The findings are consistent with mild   stenosis. There was mild regurgitation. Mean gradient (D): 6 mm   Hg. Valve area by pressure half-time: 2.47 cm^2. Valve area  by   continuity equation (using LVOT flow): 1.36 cm^2. - Left atrium: The atrium was moderately dilated. - Atrial septum: No defect or patent foramen ovale was identified. - Pulmonary arteries: PA peak pressure: 38 mm Hg (S). - Inferior vena cava: The vessel was normal in size. The   respirophasic diameter changes were blunted (< 50%), consistent   with elevated central venous pressure.  Impressions:  - The right ventricular systolic pressure was increased consistent   with mild pulmonary hypertension.   Discharge Instructions  Discharge Instructions    Amb referral to AFIB Clinic    Complete by:  As directed    Ambulatory referral to Neurology    Complete by:  As directed    An appointment is requested in approximately: 6 weeks.   Call MD for:    Complete by:  As directed    Strokelike symptoms.   Call MD for:  difficulty breathing, headache or visual disturbances    Complete by:  As directed    Call MD for:  extreme fatigue    Complete by:  As directed    Call MD for:  persistant dizziness or light-headedness    Complete by:  As directed    Call MD for:  persistant nausea and vomiting    Complete by:  As directed    Call MD for:  severe uncontrolled pain    Complete by:  As directed    Call MD for:  temperature >100.4    Complete by:  As directed  Diet - low sodium heart healthy    Complete by:  As directed    Diet Carb Modified    Complete by:  As directed    Discharge instructions    Complete by:  As directed    Continue in and out urinary catheterization every 6 hours as needed which patient/spouse assisting at home prior to hospital admission.   Increase activity slowly    Complete by:  As directed        Medication List    STOP taking these medications   aspirin 81 MG tablet   lovastatin 10 MG tablet Commonly known as:  MEVACOR Replaced by:  pravastatin 10 MG tablet     TAKE these medications   ALKA-SELTZER ANTACID PO Take 2 tablets by mouth  daily as needed (for indegestion).   amiodarone 200 MG tablet Commonly known as:  PACERONE Take 1 tablet (200 mg total) by mouth 2 (two) times daily. Discontinue after 04/23/17 doses.   amiodarone 200 MG tablet Commonly known as:  PACERONE Take 1 tablet (200 mg total) by mouth daily. Start on 04/24/17. Start taking on:  04/24/2017   apixaban 5 MG Tabs tablet Commonly known as:  ELIQUIS Take 1 tablet (5 mg total) by mouth 2 (two) times daily.   Black Elderberry(Berry-Flower) 575 MG Caps Take 1 capsule by mouth 3 (three) times daily with meals.   Cranberry 1000 MG Caps Take 2 capsules by mouth 2 (two) times daily.   cyanocobalamin 1000 MCG/ML injection Commonly known as:  (VITAMIN B-12) Inject 1,000 mcg into the muscle every 30 (thirty) days.   Digestive Enzyme Caps Take 1 capsule by mouth 3 (three) times daily with meals. 1 daily with every meal   diltiazem 240 MG 24 hr capsule Commonly known as:  CARDIZEM CD Take 1 capsule (240 mg total) by mouth daily.   docusate sodium 100 MG capsule Commonly known as:  COLACE Take 200 mg by mouth daily as needed for mild constipation.   doxycycline 100 MG tablet Commonly known as:  VIBRA-TABS Take 1 tablet (100 mg total) by mouth every 12 (twelve) hours. Discontinue after 04/18/17 doses.   fluconazole 100 MG tablet Commonly known as:  DIFLUCAN Take 1 tablet (100 mg total) by mouth daily. Discontinue after 04/23/17 doses. What changed:  additional instructions   glucose blood test strip One Touch Verio strips Use as instructed to check fasting sugars once daily .Dx:E11.9   levothyroxine 50 MCG tablet Commonly known as:  SYNTHROID, LEVOTHROID Take 1 tablet (50 mcg total) by mouth daily.   metFORMIN 500 MG 24 hr tablet Commonly known as:  GLUCOPHAGE-XR TAKE 2 TABLETS WITH BREAKFAST AND AT BEDTIME. Start 04/17/17. What changed:  See the new instructions.   metoprolol tartrate 25 MG tablet Commonly known as:  LOPRESSOR Take 0.5 tablets  (12.5 mg total) by mouth 2 (two) times daily.   OVER THE COUNTER MEDICATION Take 1 capsule by mouth 2 (two) times daily. OTC Focus Select   pravastatin 10 MG tablet Commonly known as:  PRAVACHOL Take 1 tablet (10 mg total) by mouth at bedtime. Replaces:  lovastatin 10 MG tablet   SB OMEGA-3 FISH OIL 1000 MG Caps Take 1,000 mg by mouth 2 (two) times daily. Reported on 10/06/2015   STERILID EX Apply 1 application topically See admin instructions. Eye Wash: Once Daily   TRIPLE PASTE AF 2 % Oint Generic drug:  Miconazole Nitrate Apply 1 application topically daily as needed (jock itch).   vancomycin 50  mg/mL oral solution Commonly known as:  VANCOCIN Take 2.5 mLs (125 mg total) by mouth every 6 (six) hours. Discontinue after 04/21/17 doses. What changed:  additional instructions      Follow-up Information    [name], PA-C. Schedule an appointment as soon as possible for a visit.   Specialty:  Family Medicine Why:  Upon discharge from SNF. Contact information: [name] [place] [address] [phone]        M.D. at SNF. Schedule an appointment as soon as possible for a visit in 3 day(s).   Why:  To be seen with repeat labs (CBC & BMP).         Allergies  Allergen Reactions  . Flomax [Tamsulosin Hcl] Other (See Comments)    Hypotension  . Shellfish Allergy Anaphylaxis  . Aspirin-Dipyridamole Er Other (See Comments)    Headaches   . Other Other (See Comments)    Blood Thinner given in Rehab gave H/As - Not Coumadin/Headaches        Procedures/Studies: Ct Angio Head W Or Wo Contrast  Result Date: 04/15/2017 CLINICAL DATA:  Left-sided weakness EXAM: CT ANGIOGRAPHY HEAD AND NECK TECHNIQUE: Multidetector CT imaging of the head and neck was performed using the standard protocol during bolus administration of intravenous contrast. Multiplanar CT image reconstructions and MIPs were obtained to evaluate the vascular anatomy. Carotid stenosis  measurements (when applicable) are obtained utilizing NASCET criteria, using the distal internal carotid diameter as the denominator. CONTRAST:  50 mL Isovue 370 COMPARISON:  Head CT 05/30/2016 Brain MRI 06/08/2016 FINDINGS: CTA NECK FINDINGS Aortic arch: There is no aneurysm or dissection of the visualized ascending aorta or aortic arch. There is a normal 3 vessel branching pattern. The visualized proximal subclavian arteries are widely patent. There is atherosclerotic plaque and calcification within the aortic arch. Right carotid system: The right common carotid origin is widely patent. There is no common carotid or internal carotid artery dissection or aneurysm. There is mixed calcified and noncalcified atherosclerosis at the carotid bifurcation without hemodynamically significant stenosis. Left carotid system: The left common carotid origin is widely patent. There is no common carotid or internal carotid artery dissection or aneurysm. No hemodynamically significant stenosis. Vertebral arteries: The vertebral system is left dominant. At least moderate narrowing of the right vertebral artery origin secondary to atherosclerotic calcification and noncalcified plaque. Normal left vertebral artery origin. Diminutive right V4 segment distal to the origin of the right posterior inferior cerebellar artery. Both vertebral arteries are otherwise normal to their confluence with the basilar artery. Skeleton: There is no bony spinal canal stenosis. No lytic or blastic lesions. Other neck: The nasopharynx is clear. The oropharynx and hypopharynx are normal. The epiglottis is normal. The supraglottic larynx, glottis and subglottic larynx are normal. No retropharyngeal collection. The parapharyngeal spaces are preserved. The parotid and submandibular glands are normal. No sialolithiasis or salivary ductal dilatation. The thyroid gland is normal. There is no cervical lymphadenopathy. Upper chest: Bilateral pleural effusions are  at least medium-sized, but incompletely imaged. Review of the MIP images confirms the above findings CTA HEAD FINDINGS Anterior circulation: --Intracranial internal carotid arteries: Mild atherosclerotic calcification bilaterally without hemodynamically significant stenosis. --Anterior cerebral arteries: Congenital variant diminutive left A1 segment. Otherwise normal --Middle cerebral arteries: Normal. --Posterior communicating arteries: Present on the left. Posterior circulation: --Posterior cerebral arteries: Fetal origin of the left PCA. Otherwise normal. --Superior cerebellar arteries: Normal. --Basilar artery: Normal. --Anterior inferior cerebellar arteries: Normal. --Posterior inferior cerebellar arteries: Normal.  Venous sinuses: As permitted by contrast timing, patent. Anatomic variants: Fetal origin of the left posterior cerebral artery. Hypoplastic left anterior cerebral artery A1 segment. Delayed phase: Not performed Review of the MIP images confirms the above findings IMPRESSION: 1. No emergent large vessel occlusion or high-grade stenosis. 2. Mild right carotid bifurcation atherosclerosis without hemodynamically significant stenosis. Normal cervical left carotid system. Mild atherosclerotic calcification of the intracranial internal carotid arteries at the skullbase. 3. At least moderate narrowing of the origin of the nondominant right vertebral artery. 4.  Aortic Atherosclerosis (ICD10-I70.0). 5. Bilateral pleural effusions, incompletely visualized. These results were called by telephone at the time of interpretation on 04/15/2017 at 12:55 am to Dr. Gean Birchwood , who verbally acknowledged these results. Electronically Signed   By: Ulyses Jarred M.D.   On: 04/15/2017 01:08   Dg Chest 2 View  Result Date: 04/11/2017 CLINICAL DATA:  Initial evaluation for acute chest pain, shortness of breath. EXAM: CHEST  2 VIEW COMPARISON:  Prior radiograph from 03/07/2017. FINDINGS: Cardiomegaly, similar to  previous. Mediastinal silhouette normal. Aortic atherosclerosis. Lungs are hypoinflated. Patchy and linear bibasilar opacities favored to reflect atelectasis/bronchovascular crowding. There is diffusely increased pulmonary vascular congestion with interstitial prominence, suggesting mild pulmonary interstitial edema. No other definite focal infiltrates. No significant pleural effusion. No pneumothorax. No acute osseus abnormality. IMPRESSION: 1. Cardiomegaly with diffuse pulmonary vascular congestion and interstitial prominence, suggesting mild pulmonary interstitial congestion/edema. 2. Shallow lung inflation with superimposed bibasilar atelectasis/bronchovascular crowding. 3. Aortic atherosclerosis. Electronically Signed   By: Jeannine Boga M.D.   On: 04/11/2017 16:23   Ct Angio Neck W Or Wo Contrast  Result Date: 04/15/2017 CLINICAL DATA:  Left-sided weakness EXAM: CT ANGIOGRAPHY HEAD AND NECK TECHNIQUE: Multidetector CT imaging of the head and neck was performed using the standard protocol during bolus administration of intravenous contrast. Multiplanar CT image reconstructions and MIPs were obtained to evaluate the vascular anatomy. Carotid stenosis measurements (when applicable) are obtained utilizing NASCET criteria, using the distal internal carotid diameter as the denominator. CONTRAST:  50 mL Isovue 370 COMPARISON:  Head CT 05/30/2016 Brain MRI 06/08/2016 FINDINGS: CTA NECK FINDINGS Aortic arch: There is no aneurysm or dissection of the visualized ascending aorta or aortic arch. There is a normal 3 vessel branching pattern. The visualized proximal subclavian arteries are widely patent. There is atherosclerotic plaque and calcification within the aortic arch. Right carotid system: The right common carotid origin is widely patent. There is no common carotid or internal carotid artery dissection or aneurysm. There is mixed calcified and noncalcified atherosclerosis at the carotid bifurcation  without hemodynamically significant stenosis. Left carotid system: The left common carotid origin is widely patent. There is no common carotid or internal carotid artery dissection or aneurysm. No hemodynamically significant stenosis. Vertebral arteries: The vertebral system is left dominant. At least moderate narrowing of the right vertebral artery origin secondary to atherosclerotic calcification and noncalcified plaque. Normal left vertebral artery origin. Diminutive right V4 segment distal to the origin of the right posterior inferior cerebellar artery. Both vertebral arteries are otherwise normal to their confluence with the basilar artery. Skeleton: There is no bony spinal canal stenosis. No lytic or blastic lesions. Other neck: The nasopharynx is clear. The oropharynx and hypopharynx are normal. The epiglottis is normal. The supraglottic larynx, glottis and subglottic larynx are normal. No retropharyngeal collection. The parapharyngeal spaces are preserved. The parotid and submandibular glands are normal. No sialolithiasis or salivary ductal dilatation. The thyroid gland is normal. There is no cervical lymphadenopathy. Upper  chest: Bilateral pleural effusions are at least medium-sized, but incompletely imaged. Review of the MIP images confirms the above findings CTA HEAD FINDINGS Anterior circulation: --Intracranial internal carotid arteries: Mild atherosclerotic calcification bilaterally without hemodynamically significant stenosis. --Anterior cerebral arteries: Congenital variant diminutive left A1 segment. Otherwise normal --Middle cerebral arteries: Normal. --Posterior communicating arteries: Present on the left. Posterior circulation: --Posterior cerebral arteries: Fetal origin of the left PCA. Otherwise normal. --Superior cerebellar arteries: Normal. --Basilar artery: Normal. --Anterior inferior cerebellar arteries: Normal. --Posterior inferior cerebellar arteries: Normal. Venous sinuses: As permitted  by contrast timing, patent. Anatomic variants: Fetal origin of the left posterior cerebral artery. Hypoplastic left anterior cerebral artery A1 segment. Delayed phase: Not performed Review of the MIP images confirms the above findings IMPRESSION: 1. No emergent large vessel occlusion or high-grade stenosis. 2. Mild right carotid bifurcation atherosclerosis without hemodynamically significant stenosis. Normal cervical left carotid system. Mild atherosclerotic calcification of the intracranial internal carotid arteries at the skullbase. 3. At least moderate narrowing of the origin of the nondominant right vertebral artery. 4.  Aortic Atherosclerosis (ICD10-I70.0). 5. Bilateral pleural effusions, incompletely visualized. These results were called by telephone at the time of interpretation on 04/15/2017 at 12:55 am to Dr. Gean Birchwood , who verbally acknowledged these results. Electronically Signed   By: Ulyses Jarred M.D.   On: 04/15/2017 01:08   Mr Brain Wo Contrast  Result Date: 04/16/2017 CLINICAL DATA:  Acute left-sided weakness with facial droop. EXAM: MRI HEAD WITHOUT CONTRAST TECHNIQUE: Multiplanar, multiecho pulse sequences of the brain and surrounding structures were obtained without intravenous contrast. COMPARISON:  Head CT/ CTA 04/15/2017 and MRI 06/08/2016 FINDINGS: Brain: There is a moderate to large acute right PCA territory infarct involving the right occipital lobe, posteromedial right temporal lobe, and a portion of the right thalamus. There is cytotoxic edema without evidence of hemorrhage. No intra-axial mass, midline shift, or extra-axial fluid collection is seen. A 5 mm T1 hyperintense extra-axial lesion over the left parietal convexity is unchanged from the prior MRI and likely represents a tiny meningioma. Moderate cerebral atrophy is again noted. Patchy T2 hyperintensities in the cerebral white matter bilaterally are compatible with moderate chronic small vessel ischemic disease.  Chronic infarcts are again seen in both cerebellar hemispheres, right pons, bilateral subinsular regions, and right corona radiata. Small chronic cortical infarcts are also again noted in the high right parietal lobe and right temporo occipital region. Vascular: Major intracranial vascular flow voids are preserved. Skull and upper cervical spine: Unremarkable bone marrow signal. Sinuses/Orbits: Bilateral cataract extraction. No evidence of acute paranasal sinus disease. Trace right mastoid fluid. Other: None. IMPRESSION: 1. Moderately large acute right PCA infarct. 2. Moderate chronic small vessel ischemic disease, cerebral atrophy, and chronic infarcts as above. Electronically Signed   By: Logan Bores M.D.   On: 04/16/2017 09:09   Dg Chest Port 1 View  Result Date: 04/13/2017 CLINICAL DATA:  Acute respiratory failure. EXAM: PORTABLE CHEST 1 VIEW COMPARISON:  04/11/2017 FINDINGS: Lungs are hypoinflated with bibasilar opacification left greater than right without significant change which may be due to atelectasis or infection. Stable cardiomegaly. Calcified plaque over the aortic arch. Degenerative change of the spine. IMPRESSION: Persistent bibasilar opacification which may be due to atelectasis or infection. Stable cardiomegaly. Aortic Atherosclerosis (ICD10-I70.0). Electronically Signed   By: Marin Olp M.D.   On: 04/13/2017 07:21   Ct Head Code Stroke Wo Contrast  Result Date: 04/15/2017 CLINICAL DATA:  Code stroke.  Left-sided weakness EXAM: CT HEAD WITHOUT CONTRAST TECHNIQUE:  Contiguous axial images were obtained from the base of the skull through the vertex without intravenous contrast. COMPARISON:  None. FINDINGS: Brain: No mass lesion or acute hemorrhage. No focal hypoattenuation of the basal ganglia or cortex to indicate infarcted tissue. There is generalized volume loss. There is periventricular hypoattenuation compatible with chronic microvascular disease. Old bilateral cerebellar infarcts and  centrum semiovale lacunar infarcts. Vascular: No hyperdense vessel. Atherosclerotic calcification of the vertebral and internal carotid arteries at the skull base. Skull: Normal visualized skull base, calvarium and extracranial soft tissues. Sinuses/Orbits: No sinus fluid levels or advanced mucosal thickening. No mastoid effusion. Normal orbits. ASPECTS [facility] Stroke Program Early CT Score) - Ganglionic level infarction (caudate, lentiform nuclei, internal capsule, insula, M1-M3 cortex): 7 - Supraganglionic infarction (M4-M6 cortex): 3 Total score (0-10 with 10 being normal): 10 IMPRESSION: 1. No acute hemorrhage or mass lesion. 2. Generalized atrophy; multiple old, small infarcts and findings of chronic microvascular ischemia. 3. ASPECTS is 10. These results were called by telephone at the time of interpretation on 04/15/2017 at 12:53 am to Dr. Kerney Elbe , who verbally acknowledged these results. Electronically Signed   By: Ulyses Jarred M.D.   On: 04/15/2017 00:54      Subjective: Seen this morning. States that he feels better. Still has left-sided left eye visual field deficit and left-sided weakness. Reports in and out Foley catheterization every 6 hours by spouse at home. No other complaints reported.  Discharge Exam:  Vitals:   04/15/17 2250 04/16/17 0645 04/16/17 0945 04/16/17 1416  BP: (!) 150/71 (!) 143/87  (!) 154/61  Pulse:   63 (!) 57  Resp:    (!) 23  Temp:  98.5 F (36.9 C)  (!) 97.5 F (36.4 C)  TempSrc:  Oral  Oral  SpO2: 95% 95%  95%  Weight:  72 kg (158 lb 12.8 oz)    Height:  5\' 6"  (1.676 m)      General: Pt lying comfortably in bed & appears in no obvious distress. Cardiovascular: S1 & S2 heard, RRR, S1/S2 +. No murmurs, rubs, gallops or clicks. No JVD or pedal edema.Telemetry: Sinus bradycardia in the 50s-sinus rhythm in the 60s. Respiratory: Clear to auscultation without wheezing, rhonchi or crackles. No increased work of breathing. Abdominal:  Non distended, non  tender & soft. No organomegaly or masses appreciated. Normal bowel sounds heard. CNS: Alert and oriented 2. Left eye homonymous hemianopsia. Extremities: no edema, no cyanosis. Grade 5 x 5 power in right limbs. Grade 4 x 5 power in left upper extremity and 4+ by 5 power in left lower extremity.    The results of significant diagnostics from this hospitalization (including imaging, microbiology, ancillary and laboratory) are listed below for reference.     Microbiology: Recent Results (from the past 240 hour(s))  Blood culture (routine x 2)     Status: None   Collection Time: 04/11/17  4:15 PM  Result Value Ref Range Status   Specimen Description BLOOD RIGHT ANTECUBITAL  Final   Special Requests   Final    BOTTLES DRAWN AEROBIC AND ANAEROBIC Blood Culture adequate volume   Culture   Final    NO GROWTH 5 DAYS Performed at [facility], [address], [place], [place] [address]    Report Status 04/16/2017 FINAL  Final  Blood culture (routine x 2)     Status: None   Collection Time: 04/11/17  4:30 PM  Result Value Ref Range Status   Specimen Description BLOOD RIGHT  FOREARM  Final   Special Requests   Final    BOTTLES DRAWN AEROBIC AND ANAEROBIC Blood Culture adequate volume   Culture   Final    NO GROWTH 5 DAYS Performed at [facility], [address], [place], [place] [address]    Report Status 04/16/2017 FINAL  Final  Urine Culture     Status: None   Collection Time: 04/11/17  4:55 PM  Result Value Ref Range Status   Specimen Description URINE, CATHETERIZED  Final   Special Requests NONE  Final   Culture   Final    NO GROWTH Performed at [facility], [place] [address] [place],  [address]    Report Status 04/12/2017 FINAL  Final  C difficile quick scan w PCR reflex     Status: None   Collection Time: 04/12/17 11:28 AM  Result Value Ref Range Status   C Diff antigen NEGATIVE NEGATIVE Final   C Diff toxin NEGATIVE NEGATIVE Final   C Diff  interpretation No C. difficile detected.  Final     Labs: CBC:  Recent Labs Lab 04/12/17 0655 04/13/17 0201 04/14/17 0302 04/15/17 1038 04/16/17 0300  WBC 10.5 10.1 9.7 7.3 9.2  HGB 10.3* 10.5* 10.0* 10.7* 10.1*  HCT 31.3* 30.6* 28.7* 31.6* 30.4*  MCV 84.4 83.4 83.2 84.0 83.3  PLT 200 278 258 321 967   Basic Metabolic Panel:  Recent Labs Lab 04/11/17 1540 04/12/17 0058 04/13/17 0201 04/14/17 0302 04/15/17 1038 04/16/17 0300  NA 129* 139 131* 132* 134* 137  K 4.7 3.4* 4.0 3.8 3.7 4.5  CL 93* 109 100* 101 106 108  CO2 24 20* 22 22 20* 23  GLUCOSE 148* 62* 140* 178* 184* 165*  BUN 20 19 11 12 11 10   CREATININE 1.10 1.42* 1.17 1.34* 1.16 1.23  CALCIUM 9.2 8.3* 8.6* 8.5* 8.9 8.8*  MG 1.6* 1.7 2.2  --   --   --   PHOS  --  1.9* 4.1  --   --   --    Liver Function Tests:  Recent Labs Lab 04/11/17 1540 04/14/17 0302  AST 22 16  ALT 13* 13*  ALKPHOS 45 56  BILITOT 0.5 1.1  PROT 7.2 5.7*  ALBUMIN 3.2* 2.5*   BNP (last 3 results)  Recent Labs  04/11/17 1540  BNP 829.6*   Cardiac Enzymes:  Recent Labs Lab 04/11/17 1540 04/12/17 0048  TROPONINI 0.06* 0.06*   CBG:  Recent Labs Lab 04/15/17 1118 04/15/17 1635 04/15/17 2103 04/16/17 0728 04/16/17 1141  GLUCAP 176* 150* 204* 184* 199*   Lipid Profile  Recent Labs  04/16/17 0300  CHOL 117  HDL 23*  LDLCALC 72  TRIG 110  CHOLHDL 5.1   Urinalysis    Component Value Date/Time   COLORURINE YELLOW 04/11/2017 1655   APPEARANCEUR CLEAR 04/11/2017 1655   LABSPEC 1.005 04/11/2017 1655   PHURINE 6.5 04/11/2017 1655   GLUCOSEU NEGATIVE 04/11/2017 1655   GLUCOSEU NEGATIVE 10/17/2016 1533   HGBUR NEGATIVE 04/11/2017 1655   BILIRUBINUR NEGATIVE 04/11/2017 1655   BILIRUBINUR negative 03/20/2017 0959   KETONESUR NEGATIVE 04/11/2017 1655   PROTEINUR NEGATIVE 04/11/2017 1655   UROBILINOGEN 0.2 03/20/2017 0959   UROBILINOGEN 0.2 10/17/2016 1533   NITRITE NEGATIVE 04/11/2017 1655   LEUKOCYTESUR  MODERATE (A) 04/11/2017 1655    Discussed in detail with patient's caregiver at bedside. Updated care and answered questions.  Time coordinating discharge: Over 30 minutes  SIGNED:  Vernell Leep, MD, FACP,  FHM. [facility] Pager [phone]  If 7PM-7AM, please contact night-coverage [email or web address] Password [facility] 04/16/2017, 3:36 PM

## 2017-04-17 ENCOUNTER — Telehealth: Payer: Self-pay

## 2017-04-17 LAB — HEMOGLOBIN A1C
Hgb A1c MFr Bld: 7 % — ABNORMAL HIGH (ref 4.8–5.6)
MEAN PLASMA GLUCOSE: 154 mg/dL

## 2017-04-17 NOTE — Telephone Encounter (Signed)
Noted! Thank you

## 2017-04-17 NOTE — Telephone Encounter (Signed)
Spoke with patients wife, Elease Etienne.  Patient was discharged from hospital on 04/16/2017 to [facility] at [facility] for rehab. Wife states patient is very optimistic, was able to stand yesterday and walk a few steps while at the hospital. Wife plans to "move in" with patient during his stay at facility. Advised to call when discharged for hospital f/u appointment with PCP.

## 2017-04-18 DIAGNOSIS — N390 Urinary tract infection, site not specified: Secondary | ICD-10-CM | POA: Diagnosis not present

## 2017-04-18 DIAGNOSIS — R339 Retention of urine, unspecified: Secondary | ICD-10-CM | POA: Diagnosis not present

## 2017-04-18 DIAGNOSIS — E118 Type 2 diabetes mellitus with unspecified complications: Secondary | ICD-10-CM | POA: Diagnosis not present

## 2017-04-18 DIAGNOSIS — I639 Cerebral infarction, unspecified: Secondary | ICD-10-CM | POA: Diagnosis not present

## 2017-04-18 DIAGNOSIS — I4891 Unspecified atrial fibrillation: Secondary | ICD-10-CM | POA: Diagnosis not present

## 2017-04-22 DIAGNOSIS — F0150 Vascular dementia without behavioral disturbance: Secondary | ICD-10-CM | POA: Diagnosis not present

## 2017-04-22 DIAGNOSIS — R630 Anorexia: Secondary | ICD-10-CM | POA: Diagnosis not present

## 2017-04-22 DIAGNOSIS — N319 Neuromuscular dysfunction of bladder, unspecified: Secondary | ICD-10-CM | POA: Diagnosis not present

## 2017-04-22 DIAGNOSIS — Z515 Encounter for palliative care: Secondary | ICD-10-CM | POA: Diagnosis not present

## 2017-04-22 DIAGNOSIS — I69354 Hemiplegia and hemiparesis following cerebral infarction affecting left non-dominant side: Secondary | ICD-10-CM | POA: Diagnosis not present

## 2017-04-22 DIAGNOSIS — Z87440 Personal history of urinary (tract) infections: Secondary | ICD-10-CM | POA: Diagnosis not present

## 2017-04-23 ENCOUNTER — Encounter: Payer: Self-pay | Admitting: Physician Assistant

## 2017-04-24 DIAGNOSIS — M25569 Pain in unspecified knee: Secondary | ICD-10-CM | POA: Diagnosis not present

## 2017-04-24 DIAGNOSIS — I6789 Other cerebrovascular disease: Secondary | ICD-10-CM | POA: Diagnosis not present

## 2017-04-24 DIAGNOSIS — D72829 Elevated white blood cell count, unspecified: Secondary | ICD-10-CM | POA: Diagnosis not present

## 2017-04-24 DIAGNOSIS — R630 Anorexia: Secondary | ICD-10-CM | POA: Diagnosis not present

## 2017-04-29 DIAGNOSIS — M1712 Unilateral primary osteoarthritis, left knee: Secondary | ICD-10-CM | POA: Diagnosis not present

## 2017-05-01 ENCOUNTER — Ambulatory Visit: Payer: Medicare Other | Admitting: Hematology & Oncology

## 2017-05-01 ENCOUNTER — Other Ambulatory Visit: Payer: Medicare Other

## 2017-05-01 DIAGNOSIS — Z87440 Personal history of urinary (tract) infections: Secondary | ICD-10-CM | POA: Diagnosis not present

## 2017-05-01 DIAGNOSIS — F0150 Vascular dementia without behavioral disturbance: Secondary | ICD-10-CM | POA: Diagnosis not present

## 2017-05-01 DIAGNOSIS — N319 Neuromuscular dysfunction of bladder, unspecified: Secondary | ICD-10-CM | POA: Diagnosis not present

## 2017-05-01 DIAGNOSIS — Z515 Encounter for palliative care: Secondary | ICD-10-CM | POA: Diagnosis not present

## 2017-05-01 DIAGNOSIS — R630 Anorexia: Secondary | ICD-10-CM | POA: Diagnosis not present

## 2017-05-01 DIAGNOSIS — I69354 Hemiplegia and hemiparesis following cerebral infarction affecting left non-dominant side: Secondary | ICD-10-CM | POA: Diagnosis not present

## 2017-05-05 NOTE — Progress Notes (Signed)
HPI: Fu atrial fibrillation. Previously followed by Dr Marlou Porch. Patient apparently has a history of paroxysmal atrial fibrillation since 1984. He had a CVA in 2011. Coumadin discontinued previously because of frequent falls. Patient has dementia. Admitted July 2018 with sepsis. He was found to be in atrial flutter with 2-1 conduction. Patient converted to sinus rhythm with amiodarone. Echocardiogram July 2018 showed normal LV systolic function, grade 2 diastolic dysfunction, mild aortic stenosis with mean gradient 11 mmHg, mild mitral stenosis with mean gradient 6 mmHg, mild mitral regurgitation and moderate left atrial enlargement. Patient had CVA July 2018 off of anticoagulation. MRI 04/16/2017 showed large acute right PCA infarct. Apixaban initiated. Patient also recently treated for fungal urinary tract infection and Clostridium difficile diarrhea. Also with h/o chronic diastolic CHF. Since last seen patient denies dyspnea, chest pain, palpitations, syncope or bleeding. He is in rehabilitation and is making progress in his strength.  Current Outpatient Prescriptions  Medication Sig Dispense Refill  . amiodarone (PACERONE) 200 MG tablet Take 1 tablet (200 mg total) by mouth 2 (two) times daily. Discontinue after 04/23/17 doses.    Marland Kitchen apixaban (ELIQUIS) 5 MG TABS tablet Take 1 tablet (5 mg total) by mouth 2 (two) times daily.    . Calcium Carbonate Antacid (ALKA-SELTZER ANTACID PO) Take 2 tablets by mouth daily as needed (for indegestion).     . cyanocobalamin (,VITAMIN B-12,) 1000 MCG/ML injection Inject 1,000 mcg into the muscle every 30 (thirty) days.    Marland Kitchen diltiazem (CARDIZEM CD) 240 MG 24 hr capsule Take 1 capsule (240 mg total) by mouth daily.    Marland Kitchen docusate sodium (COLACE) 100 MG capsule Take 200 mg by mouth daily as needed for mild constipation.     . fluconazole (DIFLUCAN) 100 MG tablet Take 1 tablet (100 mg total) by mouth daily. Discontinue after 04/23/17 doses.    Marland Kitchen glucose blood test  strip One Touch Verio strips Use as instructed to check fasting sugars once daily .Dx:E11.9 100 each 12  . levothyroxine (SYNTHROID, LEVOTHROID) 50 MCG tablet Take 1 tablet (50 mcg total) by mouth daily. 90 tablet 3  . metFORMIN (GLUCOPHAGE-XR) 500 MG 24 hr tablet TAKE 2 TABLETS WITH BREAKFAST AND AT BEDTIME. Start 04/17/17.    . metoprolol tartrate (LOPRESSOR) 25 MG tablet Take 0.5 tablets (12.5 mg total) by mouth 2 (two) times daily. 180 tablet 1  . Miconazole Nitrate (TRIPLE PASTE AF) 2 % OINT Apply 1 application topically daily as needed (jock itch).     Marland Kitchen omeprazole (PRILOSEC) 20 MG capsule Take 20 mg by mouth daily.    . traZODone (DESYREL) 50 MG tablet Take 25 mg by mouth at bedtime.     No current facility-administered medications for this visit.      Past Medical History:  Diagnosis Date  . A-fib ([place])   . C. difficile diarrhea   . Cataracts, bilateral   . Chronic dermatitis   . Diabetes type 2, controlled ([place]) 2000  . History of chicken pox   . Mumps    Adult  . Retinopathy   . Shingles   . Stroke [facility]) Nov 2011  . Undescended testicle, unilateral    Right  . Whooping cough     Past Surgical History:  Procedure Laterality Date  . CARDIAC VALVE REPLACEMENT    . PRE-MALIGNANT / BENIGN SKIN LESION EXCISION    . TESTICLE SURGERY     Right, undescended  . TOOTH EXTRACTION      Social  History   Social History  . Marital status: Married    Spouse name: N/A  . Number of children: N/A  . Years of education: N/A   Occupational History  . Not on file.   Social History Main Topics  . Smoking status: Former Research scientist (life sciences)  . Smokeless tobacco: Never Used     Comment: Hasn't smoked since age 18  . Alcohol use No  . Drug use: No  . Sexual activity: No   Other Topics Concern  . Not on file   Social History Narrative   Lives with wife in a one story home.  Has 1 child.  Retired Armed forces technical officer.  Education: Oceanographer.      Family History  Problem Relation Age of Onset    . Pneumonia Mother 49       Deceased  . GI Bleed Father 43       Deceased - Ulcers  . Diabetes Cousin   . Diabetes Brother   . Cancer Paternal Aunt     ROS: no fevers or chills, productive cough, hemoptysis, dysphasia, odynophagia, melena, hematochezia, dysuria, hematuria, rash, seizure activity, orthopnea, PND, pedal edema, claudication. Remaining systems are negative.  Physical Exam: Well-developed frail in no acute distress.  Skin is warm and dry.  HEENT is normal.  Neck is supple.  Chest is clear to auscultation with normal expansion.  Cardiovascular exam is regular rate and rhythm. 2/6 systolic murmur left sternal border. Abdominal exam nontender or distended. No masses palpated. Extremities show no edema. neuro grossly intact  ECG- sinus bradycardia at a rate of 57. Right bundle branch block. personally reviewed  A/P  1 Paroxysmal atrial fibrillation-patient remains in sinus rhythm. Continue amiodarone 200 mg daily. Continue Cardizem. Discontinue metoprolol. Continue apixaban. There is some risk with anticoagulation as he is frail and is unsteady. However he has had 2 CVAs and I think benefit outweighs risk. When he returns in 6 months we will check liver functions, TSH, chest x-ray, hemoglobin and renal function.   2 chronic diastolic congestive heart failure-patient appears to be euvolemic.  3 hyperlipidemia-continue statin.  4 diabetes mellitus-management per primary care.  Kirk Ruths, MD

## 2017-05-07 ENCOUNTER — Ambulatory Visit (INDEPENDENT_AMBULATORY_CARE_PROVIDER_SITE_OTHER): Payer: Medicare Other | Admitting: Cardiology

## 2017-05-07 ENCOUNTER — Encounter: Payer: Self-pay | Admitting: Cardiology

## 2017-05-07 VITALS — BP 125/67 | HR 57 | Ht 66.0 in | Wt 148.0 lb

## 2017-05-07 DIAGNOSIS — I38 Endocarditis, valve unspecified: Secondary | ICD-10-CM | POA: Diagnosis not present

## 2017-05-07 DIAGNOSIS — I639 Cerebral infarction, unspecified: Secondary | ICD-10-CM

## 2017-05-07 DIAGNOSIS — I5032 Chronic diastolic (congestive) heart failure: Secondary | ICD-10-CM

## 2017-05-07 DIAGNOSIS — E7800 Pure hypercholesterolemia, unspecified: Secondary | ICD-10-CM | POA: Diagnosis not present

## 2017-05-07 DIAGNOSIS — I480 Paroxysmal atrial fibrillation: Secondary | ICD-10-CM | POA: Diagnosis not present

## 2017-05-07 NOTE — Patient Instructions (Signed)
Medication Instructions:   STOP METOPROLOL  Follow-Up:  Your physician wants you to follow-up in: [place] will receive a reminder letter in the mail two months in advance. If you don't receive a letter, please call our office to schedule the follow-up appointment.    If you need a refill on your cardiac medications before your next appointment, please call your pharmacy.

## 2017-05-09 ENCOUNTER — Encounter: Payer: Self-pay | Admitting: Hematology & Oncology

## 2017-05-09 DIAGNOSIS — N319 Neuromuscular dysfunction of bladder, unspecified: Secondary | ICD-10-CM | POA: Diagnosis not present

## 2017-05-09 DIAGNOSIS — Z87440 Personal history of urinary (tract) infections: Secondary | ICD-10-CM | POA: Diagnosis not present

## 2017-05-09 DIAGNOSIS — I69354 Hemiplegia and hemiparesis following cerebral infarction affecting left non-dominant side: Secondary | ICD-10-CM | POA: Diagnosis not present

## 2017-05-09 DIAGNOSIS — R630 Anorexia: Secondary | ICD-10-CM | POA: Diagnosis not present

## 2017-05-09 DIAGNOSIS — Z515 Encounter for palliative care: Secondary | ICD-10-CM | POA: Diagnosis not present

## 2017-05-09 DIAGNOSIS — F0150 Vascular dementia without behavioral disturbance: Secondary | ICD-10-CM | POA: Diagnosis not present

## 2017-05-12 LAB — MPN-ET/MYELOFIBROSIS (JAK2 V617F-MPL515-CALR)

## 2017-05-14 DIAGNOSIS — N401 Enlarged prostate with lower urinary tract symptoms: Secondary | ICD-10-CM | POA: Diagnosis not present

## 2017-05-14 DIAGNOSIS — L03032 Cellulitis of left toe: Secondary | ICD-10-CM | POA: Diagnosis not present

## 2017-05-14 DIAGNOSIS — R8271 Bacteriuria: Secondary | ICD-10-CM | POA: Diagnosis not present

## 2017-05-20 DIAGNOSIS — L03032 Cellulitis of left toe: Secondary | ICD-10-CM | POA: Diagnosis not present

## 2017-05-20 DIAGNOSIS — N401 Enlarged prostate with lower urinary tract symptoms: Secondary | ICD-10-CM | POA: Diagnosis not present

## 2017-05-20 DIAGNOSIS — I509 Heart failure, unspecified: Secondary | ICD-10-CM | POA: Diagnosis not present

## 2017-05-20 DIAGNOSIS — I999 Unspecified disorder of circulatory system: Secondary | ICD-10-CM | POA: Diagnosis not present

## 2017-06-03 ENCOUNTER — Ambulatory Visit: Payer: Medicare Other | Admitting: Podiatry

## 2017-06-03 ENCOUNTER — Ambulatory Visit (INDEPENDENT_AMBULATORY_CARE_PROVIDER_SITE_OTHER): Payer: Medicare Other | Admitting: Podiatry

## 2017-06-03 DIAGNOSIS — B351 Tinea unguium: Secondary | ICD-10-CM

## 2017-06-03 DIAGNOSIS — M79674 Pain in right toe(s): Secondary | ICD-10-CM

## 2017-06-03 DIAGNOSIS — M79675 Pain in left toe(s): Secondary | ICD-10-CM

## 2017-06-03 NOTE — Progress Notes (Signed)
Subjective: 83 y.o. returns the office today for painful, elongated, thickened toenails which he cannot trim himself. Denies any redness or drainage around the nails. About 4-5 weeks ago he had a blister form on his left big toe and had some pus. There were soaking in Epson salts and he was on oral antibiotic and this resolved. Currently denies any swelling or blisters or any drainage to his toes. Denies any acute changes since last appointment and no new complaints today. Denies any systemic complaints such as fevers, chills, nausea, vomiting.   Objective: AAO 3, NAD; presents with caregier DP/PT pulses palpable 1/4, CRT less than 3 seconds Nails hypertrophic, dystrophic, elongated, brittle, discolored 10. There is incurvation along the medial and lateral hallux toenails. There is tenderness overlying the nails 1-5 bilaterally. There is no surrounding erythema or drainage along the nail sites. Underneath the left hallux toenail after debridement there was some small area of callus/scab formation where the previous blister was. There is no fluid in this area today and there is no drainage or pus. There is no erythema or signs of infection. No open lesions or pre-ulcerative lesions are identified. No other areas of tenderness bilateral lower extremities. No overlying edema, erythema, increased warmth. No pain with calf compression, swelling, warmth, erythema.  Assessment: Patient presents with symptomatic onychomycosis  Plan: -Treatment options including alternatives, risks, complications were discussed -Nails sharply debrided 10 without complication/bleeding. -Small callus area underneath the left hallux toenail distally. Recommend a small amount of anterolateral Quentin dressing changes daily. Monitor for any reoccurrence or signs of infection to call the office and medially should any occur. -Discussed daily foot inspection. If there are any changes, to call the office immediately.   -Follow-up in 3 months or sooner if any problems are to arise. In the meantime, encouraged to call the office with any questions, concerns, changes symptoms.  Celesta Gentile, DPM

## 2017-06-04 ENCOUNTER — Telehealth: Payer: Self-pay | Admitting: *Deleted

## 2017-06-04 ENCOUNTER — Encounter: Payer: Self-pay | Admitting: Physical Therapy

## 2017-06-04 NOTE — Telephone Encounter (Signed)
Patient's wife is calling and wanting to speak with PCP about what steps they may need to do going forward since his stroke. She is not sure about making an appointment because Swan would not be with her, so she is wondering if she could speak with him by phone.  Message routed to provider to advise.

## 2017-06-05 ENCOUNTER — Encounter: Payer: Self-pay | Admitting: Physician Assistant

## 2017-06-05 NOTE — Telephone Encounter (Signed)
Received MyChart Message regarding this. I am setting up a time with patient's caregiver to discuss his progress at next steps.

## 2017-06-09 ENCOUNTER — Encounter: Payer: Self-pay | Admitting: Neurology

## 2017-06-09 ENCOUNTER — Ambulatory Visit (INDEPENDENT_AMBULATORY_CARE_PROVIDER_SITE_OTHER): Payer: Medicare Other | Admitting: Neurology

## 2017-06-09 VITALS — BP 124/62 | HR 66 | Ht 66.0 in | Wt 154.0 lb

## 2017-06-09 DIAGNOSIS — I639 Cerebral infarction, unspecified: Secondary | ICD-10-CM | POA: Diagnosis not present

## 2017-06-09 DIAGNOSIS — I63431 Cerebral infarction due to embolism of right posterior cerebral artery: Secondary | ICD-10-CM

## 2017-06-09 DIAGNOSIS — E119 Type 2 diabetes mellitus without complications: Secondary | ICD-10-CM

## 2017-06-09 DIAGNOSIS — Z794 Long term (current) use of insulin: Secondary | ICD-10-CM | POA: Diagnosis not present

## 2017-06-09 DIAGNOSIS — I4891 Unspecified atrial fibrillation: Secondary | ICD-10-CM

## 2017-06-09 NOTE — Progress Notes (Signed)
NEUROLOGY FOLLOW UP OFFICE NOTE  Wesson Stith [phone]  HISTORY OF PRESENT ILLNESS: Gerald Holt is a 83 year old man with vascular dementia, type 2 diabetes, paroxysmal a-fib, and history of stroke who follows up for recent stroke.  He is accompanied by his wife and nurse who supplements history.  History also is supplemented by recent hospital admission notes.   UPDATE: He was admitted to [facility] from 04/11/17 to 04/16/17 for generalized weakness who was found to be in atrial flutter with RVR.  He has history of paroxysmal atrial fibrillation but was taken off of Eliquis and switched to ASA following a fall.  CT and MRIs were personally reviewed.  CT of head revealed no acute findings.  MRI of brain demonstrated embolic-looking right PCA territory infarct.  CTA of head and neck revealed no large vessel occlusion or high-grade stenosis.  TTE showed EF 55-60% and grade 2 diastolic dysfunction but no cardiac source of emboli, such as PFO.  LDL was 73 and Hgb A1c was 7.3.   He was started on Eliquis.  He has been at a SNF.  He is limited in ambulation and requires assistance, such as a walker.      HISTORY: Since February 2017, he has had a progressive decline in cognition.  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  He plays piano daily.  He started having trouble remembering which pieces he played and sometimes how to play them.  He also has left the stove on and water running.  He started having trouble getting dress and has put on his shirt incorrectly.  Marland Kitchen  He has had several falls.  He had to stop driving.  He uses a cane but it was recommended to use a walker.  He is hard of hearing.   He had a TIA on 05/30/16 for left arm weakness and numbness, slurred speech and feeling foggy in the head.  CT of head was personally reviewed and revealed a left parietal meningioma but no acute abnormalities.  He later had an MRI of the brain on 06/08/16, which was personally  reviewed and revealed moderate atrophy and chronic small vessel disease and a left parietal meningioma, stable but no acute/subacute stroke or bleed.     He started a memory program based out of [place].  Genetic testing reportedly showed he was negative for APOE.  He was advised to start supplements and to stop statin therapy (or at least have LDL be around 70).   There is no family history of dementia, but his family passed away at young age.  PAST MEDICAL HISTORY: Past Medical History:  Diagnosis Date  . A-fib ([place])   . C. difficile diarrhea   . Cataracts, bilateral   . Chronic dermatitis   . Diabetes type 2, controlled ([place]) 2000  . History of chicken pox   . Mumps    Adult  . Retinopathy   . Shingles   . Stroke [facility]) Nov 2011  . Undescended testicle, unilateral    Right  . Whooping cough     MEDICATIONS: Current Outpatient Prescriptions on File Prior to Visit  Medication Sig Dispense Refill  . amiodarone (PACERONE) 200 MG tablet Take 1 tablet (200 mg total) by mouth 2 (two) times daily. Discontinue after 04/23/17 doses.    Marland Kitchen apixaban (ELIQUIS) 5 MG TABS tablet Take 1 tablet (5 mg total) by mouth 2 (two) times daily.    . Calcium Carbonate Antacid (ALKA-SELTZER ANTACID  PO) Take 2 tablets by mouth daily as needed (for indegestion).     . cyanocobalamin (,VITAMIN B-12,) 1000 MCG/ML injection Inject 1,000 mcg into the muscle every 30 (thirty) days.    Marland Kitchen diltiazem (CARDIZEM CD) 240 MG 24 hr capsule Take 1 capsule (240 mg total) by mouth daily.    Marland Kitchen docusate sodium (COLACE) 100 MG capsule Take 200 mg by mouth daily as needed for mild constipation.     . fluconazole (DIFLUCAN) 100 MG tablet Take 1 tablet (100 mg total) by mouth daily. Discontinue after 04/23/17 doses. (Patient not taking: Reported on 06/09/2017)    . glucose blood test strip One Touch Verio strips Use as instructed to check fasting sugars once daily .Dx:E11.9 100 each 12  . levothyroxine (SYNTHROID, LEVOTHROID) 50 MCG  tablet Take 1 tablet (50 mcg total) by mouth daily. 90 tablet 3  . metFORMIN (GLUCOPHAGE-XR) 500 MG 24 hr tablet TAKE 2 TABLETS WITH BREAKFAST AND AT BEDTIME. Start 04/17/17.    . Miconazole Nitrate (TRIPLE PASTE AF) 2 % OINT Apply 1 application topically daily as needed (jock itch).     Marland Kitchen omeprazole (PRILOSEC) 20 MG capsule Take 20 mg by mouth daily.    . traZODone (DESYREL) 50 MG tablet Take 25 mg by mouth at bedtime.     No current facility-administered medications on file prior to visit.     ALLERGIES: Allergies  Allergen Reactions  . Flomax [Tamsulosin Hcl] Other (See Comments)    Hypotension  . Shellfish Allergy Anaphylaxis  . Aspirin-Dipyridamole Er Other (See Comments)    Headaches   . Other Other (See Comments)    Blood Thinner given in Rehab gave H/As - Not Coumadin/Headaches      FAMILY HISTORY: Family History  Problem Relation Age of Onset  . Pneumonia Mother 40       Deceased  . GI Bleed Father 53       Deceased - Ulcers  . Diabetes Cousin   . Diabetes Brother   . Cancer Paternal Aunt     SOCIAL HISTORY: Social History   Social History  . Marital status: Married    Spouse name: N/A  . Number of children: N/A  . Years of education: N/A   Occupational History  . Not on file.   Social History Main Topics  . Smoking status: Former Research scientist (life sciences)  . Smokeless tobacco: Never Used     Comment: Hasn't smoked since age 46  . Alcohol use No  . Drug use: No  . Sexual activity: No   Other Topics Concern  . Not on file   Social History Narrative   Lives with wife in a one story home.  Has 1 child.  Retired Armed forces technical officer.  Education: Oceanographer.      REVIEW OF SYSTEMS: Constitutional: No fevers, chills, or sweats, no generalized fatigue, change in appetite Eyes: No visual changes, double vision, eye pain Ear, nose and throat: No hearing loss, ear pain, nasal congestion, sore throat Cardiovascular: No chest pain, palpitations Respiratory:  No shortness of breath  at rest or with exertion, wheezes GastrointestinaI: No nausea, vomiting, diarrhea, abdominal pain, fecal incontinence Genitourinary:  No dysuria, urinary retention or frequency Musculoskeletal:  No neck pain, back pain Integumentary: No rash, pruritus, skin lesions Neurological: as above Psychiatric: No depression, insomnia, anxiety Endocrine: No palpitations, fatigue, diaphoresis, mood swings, change in appetite, change in weight, increased thirst Hematologic/Lymphatic:  No purpura, petechiae. Allergic/Immunologic: no itchy/runny eyes, nasal congestion, recent allergic reactions, rashes  PHYSICAL  EXAM: Vitals:   06/09/17 0926  BP: 124/62  Pulse: 66  SpO2: 98%   General: No acute distress.  Patient appears well-groomed.  normal body habitus. Head:  Normocephalic/atraumatic Eyes:  Fundi examined but not visualized Neck: supple, no paraspinal tenderness, full range of motion Heart:  Regular rate and rhythm Lungs:  Clear to auscultation bilaterally Back: No paraspinal tenderness Neurological Exam: alert and oriented to person, place, and time. Attention span and concentration intact, recent and remote memory intact, fund of knowledge intact.  Speech fluent and not dysarthric, language intact.  Left  Homonymous hemianopsia.  Otherwise, CN II-XII intact. Bulk and tone normal, muscle strength 5-/5 left upper extremity, 4+/5 left lower extremity, 5/5 right side.  Decreased speed and amplitude of finger-thumb tapping.  Sensation to light touch intact.  Double simultaneous tactile stimulation intact.  Deep tendon reflexes 2+ throughout, toes downgoing.  Requires assistance to stand.  Cannot stand or walk without assistance.    IMPRESSION: Embolic right PCA territory infarct, likely cardioembolic due to history of atrial fibrillation/atrial flutter  PLAN: 1.  On Eliquis.  Aware of risks of bleeding.  Advised that he should not stand or attempt to ambulate without somebody right with him. 2.  He  would benefit from neurorehab.  Will send order referral 3.  Follow up in 3 months.  26 minutes spent face to face with patient, over 50% spent discussing diagnosis, prognosis and management.  Metta Clines, DO  CC:  Leeanne Rio, PA-C

## 2017-06-09 NOTE — Patient Instructions (Signed)
1.  Continue Eliquis 2.  I will refer you to neuro rehabilitation 3.  You must always have somebody with you when you get up or try to ambulate 4.  Follow up in 3 months.

## 2017-06-15 ENCOUNTER — Encounter: Payer: Self-pay | Admitting: Physician Assistant

## 2017-06-20 DIAGNOSIS — E118 Type 2 diabetes mellitus with unspecified complications: Secondary | ICD-10-CM | POA: Diagnosis not present

## 2017-06-20 DIAGNOSIS — I4891 Unspecified atrial fibrillation: Secondary | ICD-10-CM | POA: Diagnosis not present

## 2017-06-20 DIAGNOSIS — R339 Retention of urine, unspecified: Secondary | ICD-10-CM | POA: Diagnosis not present

## 2017-06-20 DIAGNOSIS — I679 Cerebrovascular disease, unspecified: Secondary | ICD-10-CM | POA: Diagnosis not present

## 2017-06-27 ENCOUNTER — Ambulatory Visit (INDEPENDENT_AMBULATORY_CARE_PROVIDER_SITE_OTHER): Payer: Medicare Other | Admitting: Physician Assistant

## 2017-06-27 ENCOUNTER — Encounter: Payer: Self-pay | Admitting: Physician Assistant

## 2017-06-27 VITALS — BP 130/60 | HR 67 | Temp 98.2°F | Resp 14 | Ht 66.0 in | Wt 157.0 lb

## 2017-06-27 DIAGNOSIS — I639 Cerebral infarction, unspecified: Secondary | ICD-10-CM | POA: Diagnosis not present

## 2017-06-27 DIAGNOSIS — Z978 Presence of other specified devices: Secondary | ICD-10-CM

## 2017-06-27 DIAGNOSIS — Z960 Presence of urogenital implants: Secondary | ICD-10-CM

## 2017-06-27 DIAGNOSIS — R531 Weakness: Secondary | ICD-10-CM | POA: Diagnosis not present

## 2017-06-27 DIAGNOSIS — W19XXXA Unspecified fall, initial encounter: Secondary | ICD-10-CM | POA: Diagnosis not present

## 2017-06-27 DIAGNOSIS — Z9289 Personal history of other medical treatment: Secondary | ICD-10-CM | POA: Diagnosis not present

## 2017-06-27 NOTE — Progress Notes (Signed)
Pre visit review using our clinic review tool, if applicable. No additional management support is needed unless otherwise documented below in the visit note. 

## 2017-06-27 NOTE — Patient Instructions (Signed)
Please send me the specifics for orderables, so I can get these taken care of ASAP.  Please go to the lab for blood work. I will call with your results.  I am very impressed with progress. We will plan on follow-up in 1 month.

## 2017-06-27 NOTE — Progress Notes (Addendum)
Patient presents to clinic today for follow-up. Patient has been in SNF for several weeks following hospitalization for CVA of R PCA. Has been receiving skilled nursing care at discharge from hospital and has been having aggressive PT. Patient has been marked ready for discharge to home but home dwelling is not yet ready for patient to return safely. Family is putting in vinyl flooring instead of carpet. They are installing home handicap ramps, a handicap accessible chair for their Lucianne Lei, and are wanting to get an adjustable hospital bed for patient to help promote better mobility, repositioning and safety.  As such, patient remains at [facility] as a resident until going home. In terms of cognition, family remarks patient has been doing extremely well. Is engaging in conversation often. Denies complaints of worsening mood, memory, change in mental status, vision changes from baseline after stroke. L-sided weakness and neglect is still the biggest issue presently. PT has worked with him extensively and wife would like to continue outpatient. States they were told on discharge from the hospital that he would likely never walk again. Is currently walking well with assistance of a walker. Patient states he is determined to keep working on ambulation. Requires assistance with most ADLS presently. Has indwelling foley catheter and has not had recurrence of UTI since this was placed. Family notes patient is eating and drinking well. Sugars remaining stable.   Past Medical History:  Diagnosis Date  . A-fib ([place])   . C. difficile diarrhea   . Cataracts, bilateral   . Chronic dermatitis   . Diabetes type 2, controlled ([place]) 2000  . History of chicken pox   . Mumps    Adult  . Retinopathy   . Shingles   . Stroke [facility]) Nov 2011  . Undescended testicle, unilateral    Right  . Whooping cough     Current Outpatient Prescriptions on File Prior to Visit  Medication Sig Dispense Refill  . amiodarone  (PACERONE) 200 MG tablet Take 1 tablet (200 mg total) by mouth 2 (two) times daily. Discontinue after 04/23/17 doses.    Marland Kitchen apixaban (ELIQUIS) 5 MG TABS tablet Take 1 tablet (5 mg total) by mouth 2 (two) times daily.    . Calcium Carbonate Antacid (ALKA-SELTZER ANTACID PO) Take 2 tablets by mouth daily as needed (for indegestion).     . cyanocobalamin (,VITAMIN B-12,) 1000 MCG/ML injection Inject 1,000 mcg into the muscle every 30 (thirty) days.    Marland Kitchen diltiazem (CARDIZEM CD) 240 MG 24 hr capsule Take 1 capsule (240 mg total) by mouth daily.    Marland Kitchen docusate sodium (COLACE) 100 MG capsule Take 200 mg by mouth daily as needed for mild constipation.     Marland Kitchen glucose blood test strip One Touch Verio strips Use as instructed to check fasting sugars once daily .Dx:E11.9 100 each 12  . levothyroxine (SYNTHROID, LEVOTHROID) 50 MCG tablet Take 1 tablet (50 mcg total) by mouth daily. 90 tablet 3  . metFORMIN (GLUCOPHAGE-XR) 500 MG 24 hr tablet TAKE 2 TABLETS WITH BREAKFAST AND AT BEDTIME. Start 04/17/17.    . Miconazole Nitrate (TRIPLE PASTE AF) 2 % OINT Apply 1 application topically daily as needed (jock itch).     Marland Kitchen omeprazole (PRILOSEC) 20 MG capsule Take 20 mg by mouth daily.    . traZODone (DESYREL) 50 MG tablet Take 25 mg by mouth at bedtime.     No current facility-administered medications on file prior to visit.     Allergies  Allergen Reactions  .  Flomax [Tamsulosin Hcl] Other (See Comments)    Hypotension  . Shellfish Allergy Anaphylaxis  . Aspirin-Dipyridamole Er Other (See Comments)    Headaches   . Other Other (See Comments)    Blood Thinner given in Rehab gave H/As - Not Coumadin/Headaches      Family History  Problem Relation Age of Onset  . Pneumonia Mother 43       Deceased  . GI Bleed Father 84       Deceased - Ulcers  . Diabetes Cousin   . Diabetes Brother   . Cancer Paternal Aunt     Social History   Social History  . Marital status: Married    Spouse name: N/A  . Number  of children: N/A  . Years of education: N/A   Social History Main Topics  . Smoking status: Former Research scientist (life sciences)  . Smokeless tobacco: Never Used     Comment: Hasn't smoked since age 88  . Alcohol use No  . Drug use: No  . Sexual activity: No   Other Topics Concern  . None   Social History Narrative   Lives with wife in a one story home.  Has 1 child.  Retired Armed forces technical officer.  Education: Oceanographer.     Review of Systems - See HPI.  All other ROS are negative.  BP 130/60   Pulse 67   Temp 98.2 F (36.8 C) (Oral)   Resp 14   Ht '5\' 6"'  (1.676 m)   Wt 157 lb (71.2 kg)   SpO2 98%   BMI 25.34 kg/m   Physical Exam  Constitutional: He is oriented to person, place, and time and well-developed, well-nourished, and in no distress.  HENT:  Head: Normocephalic and atraumatic.  Eyes: Conjunctivae are normal.  Neck: Neck supple.  Cardiovascular: Normal rate and regular rhythm.   Pulmonary/Chest: Effort normal and breath sounds normal. No respiratory distress. He has no wheezes. He has no rales. He exhibits no tenderness.  Abdominal: Soft. Bowel sounds are normal. He exhibits no distension and no mass. There is no tenderness. There is no rebound and no guarding.  Neurological: He is alert and oriented to person, place, and time. No cranial nerve deficit.  Some left-sided neglect noted.  Strength RUE/RLE 5/5 Strength LUE/LLE 3/5  Is able to ambulate successfully with walker but has to be reminded to pay attention to his LLE.  Skin: Skin is warm and dry. No rash noted.  Psychiatric: Affect normal.  Vitals reviewed.   Recent Results (from the past 2160 hour(s))  CBC w/Diff     Status: Abnormal   Collection Time: 04/02/17 12:18 PM  Result Value Ref Range   WBC 20.2 Repeated and verified X2. (HH) 4.0 - 10.5 K/uL   RBC 4.27 4.22 - 5.81 Mil/uL   Hemoglobin 12.5 (L) 13.0 - 17.0 g/dL   HCT 37.3 (L) 39.0 - 52.0 %   MCV 87.2 78.0 - 100.0 fl   MCHC 33.7 30.0 - 36.0 g/dL   RDW 15.4 11.5 - 15.5 %    Platelets 226.0 150.0 - 400.0 K/uL   Neutrophils Relative % 27.3 (L) 43.0 - 77.0 %   Lymphocytes Relative 18.2 12.0 - 46.0 %   Monocytes Relative 5.4 3.0 - 12.0 %   Eosinophils Relative 48.5 (H) 0.0 - 5.0 %    Comment: manual diff=22seg,2band,18lymph,50moo,52eos   Basophils Relative 0.6 0.0 - 3.0 %   Neutro Abs 5.5 1.4 - 7.7 K/uL   Lymphs Abs 3.7 0.7 - 4.0  K/uL   Monocytes Absolute 1.1 (H) 0.1 - 1.0 K/uL   Eosinophils Absolute 9.8 (H) 0.0 - 0.7 K/uL   Basophils Absolute 0.1 0.0 - 0.1 K/uL  Comp Met (CMET)     Status: Abnormal   Collection Time: 04/02/17 12:18 PM  Result Value Ref Range   Sodium 136 135 - 145 mEq/L   Potassium 5.6 (H) 3.5 - 5.1 mEq/L   Chloride 98 96 - 112 mEq/L   CO2 27 19 - 32 mEq/L   Glucose, Bld 119 (H) 70 - 99 mg/dL   BUN 13 6 - 23 mg/dL   Creatinine, Ser 1.14 0.40 - 1.50 mg/dL   Total Bilirubin 0.4 0.2 - 1.2 mg/dL   Alkaline Phosphatase 52 39 - 117 U/L   AST 14 0 - 37 U/L   ALT 13 0 - 53 U/L   Total Protein 6.9 6.0 - 8.3 g/dL   Albumin 3.9 3.5 - 5.2 g/dL   Calcium 10.1 8.4 - 10.5 mg/dL   GFR 63.94 >60.00 mL/min  CBC w/Diff     Status: Abnormal   Collection Time: 04/04/17  1:04 PM  Result Value Ref Range   WBC 19.5 (H) 4.0 - 10.0 10e3/uL   RBC 4.13 (L) 4.20 - 5.70 10e6/uL   HGB 12.3 (L) 13.0 - 17.1 g/dL   HCT 35.7 (L) 38.7 - 49.9 %   MCV 86 82 - 98 fL   MCH 29.8 28.0 - 33.4 pg   MCHC 34.5 32.0 - 35.9 g/dL   RDW 14.6 11.1 - 15.7 %   Platelets 215 145 - 400 10e3/uL   NEUT# 4.7 1.5 - 6.5 10e3/uL   LYMPH# 3.4 (H) 0.9 - 3.3 10e3/uL   MONO# 1.0 (H) 0.1 - 0.9 10e3/uL   Eosinophils Absolute 10.2 (H) 0.0 - 0.5 10e3/uL   BASO# 0.1 0.0 - 0.2 10e3/uL   NEUT% 24.3 (L) 40.0 - 80.0 %   LYMPH% 17.6 14.0 - 48.0 %   MONO% 5.3 0.0 - 13.0 %   EOS% 52.3 (H) 0.0 - 7.0 %   BASO% 0.5 0.0 - 2.0 %  LDH     Status: None   Collection Time: 04/04/17  1:04 PM  Result Value Ref Range   LDH 173 125 - 245 U/L  Smear     Status: None   Collection Time: 04/04/17  1:04 PM   Result Value Ref Range   Smear Result Smear Available   Flow Cytometry     Status: None   Collection Time: 04/04/17  1:04 PM  Result Value Ref Range   Flow Cytometry [place] Cancelled by Pathologist per Flow tech   Comprehensive metabolic panel     Status: Abnormal   Collection Time: 04/04/17  1:04 PM  Result Value Ref Range   Glucose 143 (H) 65 - 99 mg/dL   BUN 11 10 - 36 mg/dL   Creatinine, Ser 1.06 0.76 - 1.27 mg/dL   GFR calc non Af Amer 61 >59 mL/min/1.73   GFR calc Af Amer 71 >59 mL/min/1.73   BUN/Creatinine Ratio 10 10 - 24   Sodium 140 134 - 144 mmol/L   Potassium, Ser 5.2 3.5 - 5.2 mmol/L   Chloride, Ser 101 96 - 106 mmol/L   Carbon Dioxide, Total 26 20 - 29 mmol/L   Calcium, Ser 10.6 (H) 8.6 - 10.2 mg/dL   Total Protein 6.9 6.0 - 8.5 g/dL   Albumin, Serum 4.1 3.2 - 4.6 g/dL   Globulin, Total 2.8 1.5 -  4.5 g/dL   Albumin/Globulin Ratio 1.5 1.2 - 2.2   Bilirubin Total 0.3 0.0 - 1.2 mg/dL   Alkaline Phosphatase, S 55 39 - 117 IU/L   AST (SGOT) 16 0 - 40 IU/L   ALT 16 0 - 44 IU/L  Flow Cytometry     Status: None   Collection Time: 04/07/17 12:47 PM  Result Value Ref Range   Flow Cytometry [place] See Separate Report   MPN-ET/MYELOFIBROSIS (JAK2 [id number])     Status: None   Collection Time: 04/07/17  3:32 PM  Result Value Ref Range   MPN-ET/Myelofibrosis-Genpath See scanned results in Epic   Basic metabolic panel     Status: Abnormal   Collection Time: 04/11/17  3:40 PM  Result Value Ref Range   Sodium 129 (L) 135 - 145 mmol/L   Potassium 4.7 3.5 - 5.1 mmol/L   Chloride 93 (L) 101 - 111 mmol/L   CO2 24 22 - 32 mmol/L   Glucose, Bld 148 (H) 65 - 99 mg/dL   BUN 20 6 - 20 mg/dL   Creatinine, Ser 1.10 0.61 - 1.24 mg/dL   Calcium 9.2 8.9 - 10.3 mg/dL   GFR calc non Af Amer 57 (L) >60 mL/min   GFR calc Af Amer >60 >60 mL/min    Comment: (NOTE) The eGFR has been calculated using the CKD EPI equation. This calculation has not been validated in all clinical  situations. eGFR's persistently <60 mL/min signify possible Chronic Kidney Disease.    Anion gap 12 5 - 15  Magnesium     Status: Abnormal   Collection Time: 04/11/17  3:40 PM  Result Value Ref Range   Magnesium 1.6 (L) 1.7 - 2.4 mg/dL  CBC     Status: Abnormal   Collection Time: 04/11/17  3:40 PM  Result Value Ref Range   WBC 14.5 (H) 4.0 - 10.5 K/uL   RBC 3.79 (L) 4.22 - 5.81 MIL/uL   Hemoglobin 11.1 (L) 13.0 - 17.0 g/dL   HCT 32.1 (L) 39.0 - 52.0 %   MCV 84.7 78.0 - 100.0 fL   MCH 29.3 26.0 - 34.0 pg   MCHC 34.6 30.0 - 36.0 g/dL   RDW 14.5 11.5 - 15.5 %   Platelets 237 150 - 400 K/uL  Brain natriuretic peptide (IF shortness of breath has been documented this visit)     Status: Abnormal   Collection Time: 04/11/17  3:40 PM  Result Value Ref Range   B Natriuretic Peptide 829.6 (H) 0.0 - 100.0 pg/mL  Troponin I     Status: Abnormal   Collection Time: 04/11/17  3:40 PM  Result Value Ref Range   Troponin I 0.06 (HH) <0.03 ng/mL    Comment: CRITICAL RESULT CALLED TO, READ BACK BY AND VERIFIED WITH: MARVA SIMMS RN '@[id number]'  04/11/2017 OLSONM   Hepatic function panel     Status: Abnormal   Collection Time: 04/11/17  3:40 PM  Result Value Ref Range   Total Protein 7.2 6.5 - 8.1 g/dL   Albumin 3.2 (L) 3.5 - 5.0 g/dL   AST 22 15 - 41 U/L   ALT 13 (L) 17 - 63 U/L   Alkaline Phosphatase 45 38 - 126 U/L   Total Bilirubin 0.5 0.3 - 1.2 mg/dL   Bilirubin, Direct 0.1 0.1 - 0.5 mg/dL   Indirect Bilirubin 0.4 0.3 - 0.9 mg/dL  I-Stat CG4 Lactic Acid, ED     Status: Abnormal   Collection Time: 04/11/17  4:14 PM  Result Value Ref Range   Lactic Acid, Venous 4.44 (HH) 0.5 - 1.9 mmol/L   Comment NOTIFIED PHYSICIAN   Blood culture (routine x 2)     Status: None   Collection Time: 04/11/17  4:15 PM  Result Value Ref Range   Specimen Description BLOOD RIGHT ANTECUBITAL    Special Requests      BOTTLES DRAWN AEROBIC AND ANAEROBIC Blood Culture adequate volume   Culture      NO GROWTH 5  DAYS Performed at [facility], [place] [address] [place], [place] [address]    Report Status 04/16/2017 FINAL   Blood culture (routine x 2)     Status: None   Collection Time: 04/11/17  4:30 PM  Result Value Ref Range   Specimen Description BLOOD RIGHT FOREARM    Special Requests      BOTTLES DRAWN AEROBIC AND ANAEROBIC Blood Culture adequate volume   Culture      NO GROWTH 5 DAYS Performed at [facility], [place] [address] [place], [place] [address]    Report Status 04/16/2017 FINAL   Urinalysis, Routine w reflex microscopic     Status: Abnormal   Collection Time: 04/11/17  4:55 PM  Result Value Ref Range   Color, Urine YELLOW YELLOW   APPearance CLEAR CLEAR   Specific Gravity, Urine 1.005 1.005 - 1.030   pH 6.5 5.0 - 8.0   Glucose, UA NEGATIVE NEGATIVE mg/dL   Hgb urine dipstick NEGATIVE NEGATIVE   Bilirubin Urine NEGATIVE NEGATIVE   Ketones, ur NEGATIVE NEGATIVE mg/dL   Protein, ur NEGATIVE NEGATIVE mg/dL   Nitrite NEGATIVE NEGATIVE   Leukocytes, UA MODERATE (A) NEGATIVE  Urine Culture     Status: None   Collection Time: 04/11/17  4:55 PM  Result Value Ref Range   Specimen Description URINE, CATHETERIZED    Special Requests NONE    Culture      NO GROWTH Performed at [facility], [place] [address] [place], [place] [address]    Report Status 04/12/2017 FINAL   Urinalysis, Microscopic (reflex)     Status: Abnormal   Collection Time: 04/11/17  4:55 PM  Result Value Ref Range   RBC / HPF NONE SEEN 0 - 5 RBC/hpf   WBC, UA 6-30 0 - 5 WBC/hpf   Bacteria, UA FEW (A) NONE SEEN   Squamous Epithelial / LPF 0-5 (A) NONE SEEN  I-Stat CG4 Lactic Acid, ED  (not at  [facility])     Status: Abnormal   Collection Time: 04/11/17  7:36 PM  Result Value Ref Range   Lactic Acid, Venous 3.12 (HH) 0.5 - 1.9 mmol/L   Comment NOTIFIED PHYSICIAN   CBG monitoring, ED     Status: Abnormal   Collection Time: 04/11/17  7:45 PM  Result Value Ref Range   Glucose-Capillary 170 (H) 65  - 99 mg/dL  Glucose, capillary     Status: Abnormal   Collection Time: 04/11/17 10:03 PM  Result Value Ref Range   Glucose-Capillary 182 (H) 65 - 99 mg/dL  Glucose, capillary     Status: Abnormal   Collection Time: 04/12/17 12:19 AM  Result Value Ref Range   Glucose-Capillary 198 (H) 65 - 99 mg/dL  Lactic acid, plasma     Status: Abnormal   Collection Time: 04/12/17 12:48 AM  Result Value Ref Range   Lactic Acid, Venous 2.0 (HH) 0.5 - 1.9 mmol/L    Comment: CRITICAL RESULT CALLED TO, READ BACK BY AND  VERIFIED WITH: M.NYAKO,RN 0221 04/12/17 M.CAMPBELL   Troponin I     Status: Abnormal   Collection Time: 04/12/17 12:48 AM  Result Value Ref Range   Troponin I 0.06 (HH) <0.03 ng/mL    Comment: CRITICAL RESULT CALLED TO, READ BACK BY AND VERIFIED WITH: NYAKO M,RN 04/12/17 0222 WAYK   Osmolality, urine     Status: None   Collection Time: 04/12/17 12:57 AM  Result Value Ref Range   Osmolality, Ur 351 300 - 900 mOsm/kg  Sodium, urine, random     Status: None   Collection Time: 04/12/17 12:57 AM  Result Value Ref Range   Sodium, Ur 98 mmol/L  Basic metabolic panel     Status: Abnormal   Collection Time: 04/12/17 12:58 AM  Result Value Ref Range   Sodium 139 135 - 145 mmol/L   Potassium 3.4 (L) 3.5 - 5.1 mmol/L   Chloride 109 101 - 111 mmol/L   CO2 20 (L) 22 - 32 mmol/L   Glucose, Bld 62 (L) 65 - 99 mg/dL   BUN 19 6 - 20 mg/dL   Creatinine, Ser 1.42 (H) 0.61 - 1.24 mg/dL   Calcium 8.3 (L) 8.9 - 10.3 mg/dL   GFR calc non Af Amer 42 (L) >60 mL/min   GFR calc Af Amer 48 (L) >60 mL/min    Comment: (NOTE) The eGFR has been calculated using the CKD EPI equation. This calculation has not been validated in all clinical situations. eGFR's persistently <60 mL/min signify possible Chronic Kidney Disease.    Anion gap 10 5 - 15  Magnesium     Status: None   Collection Time: 04/12/17 12:58 AM  Result Value Ref Range   Magnesium 1.7 1.7 - 2.4 mg/dL  Phosphorus     Status: Abnormal    Collection Time: 04/12/17 12:58 AM  Result Value Ref Range   Phosphorus 1.9 (L) 2.5 - 4.6 mg/dL  Protime-INR     Status: None   Collection Time: 04/12/17 12:58 AM  Result Value Ref Range   Prothrombin Time 13.3 11.4 - 15.2 seconds   INR 1.01   Osmolality     Status: None   Collection Time: 04/12/17 12:58 AM  Result Value Ref Range   Osmolality 289 275 - 295 mOsm/kg  Procalcitonin - Baseline     Status: None   Collection Time: 04/12/17 12:58 AM  Result Value Ref Range   Procalcitonin 0.12 ng/mL    Comment:        Interpretation: PCT (Procalcitonin) <= 0.5 ng/mL: Systemic infection (sepsis) is not likely. Local bacterial infection is possible. (NOTE)         ICU PCT Algorithm               Non ICU PCT Algorithm    ----------------------------     ------------------------------         PCT < 0.25 ng/mL                 PCT < 0.1 ng/mL     Stopping of antibiotics            Stopping of antibiotics       strongly encouraged.               strongly encouraged.    ----------------------------     ------------------------------       PCT level decrease by               PCT < 0.25 ng/mL       >=  80% from peak PCT       OR PCT 0.25 - 0.5 ng/mL          Stopping of antibiotics                                             encouraged.     Stopping of antibiotics           encouraged.    ----------------------------     ------------------------------       PCT level decrease by              PCT >= 0.25 ng/mL       < 80% from peak PCT        AND PCT >= 0.5 ng/mL            Continuin g antibiotics                                              encouraged.       Continuing antibiotics            encouraged.    ----------------------------     ------------------------------     PCT level increase compared          PCT > 0.5 ng/mL         with peak PCT AND          PCT >= 0.5 ng/mL             Escalation of antibiotics                                          strongly encouraged.       Escalation of antibiotics        strongly encouraged.   Glucose, capillary     Status: Abnormal   Collection Time: 04/12/17  4:12 AM  Result Value Ref Range   Glucose-Capillary 175 (H) 65 - 99 mg/dL   Comment 1 Notify RN   CBC     Status: Abnormal   Collection Time: 04/12/17  6:55 AM  Result Value Ref Range   WBC 10.5 4.0 - 10.5 K/uL   RBC 3.71 (L) 4.22 - 5.81 MIL/uL   Hemoglobin 10.3 (L) 13.0 - 17.0 g/dL   HCT 31.3 (L) 39.0 - 52.0 %   MCV 84.4 78.0 - 100.0 fL   MCH 27.8 26.0 - 34.0 pg   MCHC 32.9 30.0 - 36.0 g/dL   RDW 14.4 11.5 - 15.5 %   Platelets 200 150 - 400 K/uL  Glucose, capillary     Status: Abnormal   Collection Time: 04/12/17  8:04 AM  Result Value Ref Range   Glucose-Capillary 187 (H) 65 - 99 mg/dL   Comment 1 Capillary Specimen    Comment 2 Notify RN   Glucose, capillary     Status: Abnormal   Collection Time: 04/12/17 11:20 AM  Result Value Ref Range   Glucose-Capillary 247 (H) 65 - 99 mg/dL   Comment 1 Capillary Specimen    Comment 2 Notify RN   C difficile quick scan w PCR reflex     Status: None  Collection Time: 04/12/17 11:28 AM  Result Value Ref Range   C Diff antigen NEGATIVE NEGATIVE   C Diff toxin NEGATIVE NEGATIVE   C Diff interpretation No C. difficile detected.   Glucose, capillary     Status: Abnormal   Collection Time: 04/12/17  3:43 PM  Result Value Ref Range   Glucose-Capillary 205 (H) 65 - 99 mg/dL   Comment 1 Capillary Specimen    Comment 2 Notify RN   Glucose, capillary     Status: Abnormal   Collection Time: 04/12/17  8:24 PM  Result Value Ref Range   Glucose-Capillary 167 (H) 65 - 99 mg/dL   Comment 1 Notify RN   Glucose, capillary     Status: Abnormal   Collection Time: 04/12/17 11:46 PM  Result Value Ref Range   Glucose-Capillary 154 (H) 65 - 99 mg/dL   Comment 1 Notify RN   Procalcitonin     Status: None   Collection Time: 04/13/17  2:01 AM  Result Value Ref Range   Procalcitonin <0.10 ng/mL    Comment:         Interpretation: PCT (Procalcitonin) <= 0.5 ng/mL: Systemic infection (sepsis) is not likely. Local bacterial infection is possible. (NOTE)         ICU PCT Algorithm               Non ICU PCT Algorithm    ----------------------------     ------------------------------         PCT < 0.25 ng/mL                 PCT < 0.1 ng/mL     Stopping of antibiotics            Stopping of antibiotics       strongly encouraged.               strongly encouraged.    ----------------------------     ------------------------------       PCT level decrease by               PCT < 0.25 ng/mL       >= 80% from peak PCT       OR PCT 0.25 - 0.5 ng/mL          Stopping of antibiotics                                             encouraged.     Stopping of antibiotics           encouraged.    ----------------------------     ------------------------------       PCT level decrease by              PCT >= 0.25 ng/mL       < 80% from peak PCT        AND PCT >= 0.5 ng/mL            Continuin g antibiotics                                              encouraged.       Continuing antibiotics            encouraged.    ----------------------------     ------------------------------  PCT level increase compared          PCT > 0.5 ng/mL         with peak PCT AND          PCT >= 0.5 ng/mL             Escalation of antibiotics                                          strongly encouraged.      Escalation of antibiotics        strongly encouraged.   CBC     Status: Abnormal   Collection Time: 04/13/17  2:01 AM  Result Value Ref Range   WBC 10.1 4.0 - 10.5 K/uL   RBC 3.67 (L) 4.22 - 5.81 MIL/uL   Hemoglobin 10.5 (L) 13.0 - 17.0 g/dL   HCT 30.6 (L) 39.0 - 52.0 %   MCV 83.4 78.0 - 100.0 fL   MCH 28.6 26.0 - 34.0 pg   MCHC 34.3 30.0 - 36.0 g/dL   RDW 14.2 11.5 - 15.5 %   Platelets 278 150 - 400 K/uL  Basic metabolic panel     Status: Abnormal   Collection Time: 04/13/17  2:01 AM  Result Value Ref Range   Sodium  131 (L) 135 - 145 mmol/L    Comment: DELTA CHECK NOTED   Potassium 4.0 3.5 - 5.1 mmol/L   Chloride 100 (L) 101 - 111 mmol/L   CO2 22 22 - 32 mmol/L   Glucose, Bld 140 (H) 65 - 99 mg/dL   BUN 11 6 - 20 mg/dL   Creatinine, Ser 1.17 0.61 - 1.24 mg/dL   Calcium 8.6 (L) 8.9 - 10.3 mg/dL   GFR calc non Af Amer 53 (L) >60 mL/min   GFR calc Af Amer >60 >60 mL/min    Comment: (NOTE) The eGFR has been calculated using the CKD EPI equation. This calculation has not been validated in all clinical situations. eGFR's persistently <60 mL/min signify possible Chronic Kidney Disease.    Anion gap 9 5 - 15  Magnesium     Status: None   Collection Time: 04/13/17  2:01 AM  Result Value Ref Range   Magnesium 2.2 1.7 - 2.4 mg/dL  Phosphorus     Status: None   Collection Time: 04/13/17  2:01 AM  Result Value Ref Range   Phosphorus 4.1 2.5 - 4.6 mg/dL  Glucose, capillary     Status: Abnormal   Collection Time: 04/13/17  5:05 AM  Result Value Ref Range   Glucose-Capillary 139 (H) 65 - 99 mg/dL   Comment 1 Notify RN   Glucose, capillary     Status: Abnormal   Collection Time: 04/13/17  7:44 AM  Result Value Ref Range   Glucose-Capillary 139 (H) 65 - 99 mg/dL   Comment 1 Capillary Specimen    Comment 2 Notify RN   ECHOCARDIOGRAM COMPLETE     Status: Abnormal   Collection Time: 04/13/17 10:59 AM  Result Value Ref Range   Weight 2,564.39 oz   Height 66 in   BP 121/59 mmHg   AV vel 1.9    LV PW d 10 (A) 0.6 - 1.1 mm   FS 21 (A) 28 - 44 %   LA vol 111 mL   LA ID, A-P, ES 44 mm   IVS/LV PW RATIO,  ED 1    LVOT VTI 26.7 cm   Reg peak vel 295 cm/s   RV sys press 38 mmHg   LV e' LATERAL 5.35 cm/s   LV E/e' medial 36.64    LV E/e'average 36.64    AV pk vel 235 cm/s   AV Area VTI index 1.04 cm2/m2   AV Area VTI 1.56 cm2   AV VEL mean LVOT/AV .6    AV Area mean vel 1.7 cm2   AV area mean vel ind .93 cm2/m2   MV Annulus VTI 55.8 cm   LA diam index 2.42 cm/m2   LA vol A4C 100 ml   Mean grad 11  mmHg   Valve area 1.90 cm2   LVOT peak grad rest 7 mmHg   Mean grad 6 mmHg   Area-P 1/2 2.47 cm2   E decel time 285 msec   LVOT diameter 19 mm   LVOT area 2.84 cm2   LVOT peak vel 129 cm/s   LVOT peak VTI .67 cm   Ao pk vel .55 m/s   VTI 40 cm   LVOT SV 76.00 mL   Peak grad 22 mmHg   Peak grad 15 mmHg   E/e' ratio 36.64    AO mean calculated velocity dopler 148 cm/s   MV pk E vel 196 m/s   TR max vel 295 cm/s   P 1/2 time 90 ms   MV pk A vel 131 m/s   LA vol index 61.0 mL/m2   Valve area index 1.04    AV peak Index .19    MV M vel 107    MV Dec 285    LVOT MV VTI 1.36    LVOT MV VTI INDEX .75 cm2/m2   LA diam end sys 44.00 mm   TDI e' medial 5.91    TDI e' lateral 5.35    Lateral S' vel 13.10 cm/sec   TAPSE 15.70 mm  Glucose, capillary     Status: Abnormal   Collection Time: 04/13/17 12:50 PM  Result Value Ref Range   Glucose-Capillary 123 (H) 65 - 99 mg/dL  Glucose, capillary     Status: Abnormal   Collection Time: 04/13/17  4:05 PM  Result Value Ref Range   Glucose-Capillary 207 (H) 65 - 99 mg/dL   Comment 1 Capillary Specimen    Comment 2 Notify RN   Glucose, capillary     Status: Abnormal   Collection Time: 04/13/17 11:40 PM  Result Value Ref Range   Glucose-Capillary 115 (H) 65 - 99 mg/dL  Glucose, capillary     Status: Abnormal   Collection Time: 04/14/17 12:19 AM  Result Value Ref Range   Glucose-Capillary 132 (H) 65 - 99 mg/dL  Procalcitonin     Status: None   Collection Time: 04/14/17  3:02 AM  Result Value Ref Range   Procalcitonin 0.12 ng/mL    Comment:        Interpretation: PCT (Procalcitonin) <= 0.5 ng/mL: Systemic infection (sepsis) is not likely. Local bacterial infection is possible. (NOTE)         ICU PCT Algorithm               Non ICU PCT Algorithm    ----------------------------     ------------------------------         PCT < 0.25 ng/mL                 PCT < 0.1 ng/mL     Stopping  of antibiotics            Stopping of antibiotics        strongly encouraged.               strongly encouraged.    ----------------------------     ------------------------------       PCT level decrease by               PCT < 0.25 ng/mL       >= 80% from peak PCT       OR PCT 0.25 - 0.5 ng/mL          Stopping of antibiotics                                             encouraged.     Stopping of antibiotics           encouraged.    ----------------------------     ------------------------------       PCT level decrease by              PCT >= 0.25 ng/mL       < 80% from peak PCT        AND PCT >= 0.5 ng/mL            Continuin g antibiotics                                              encouraged.       Continuing antibiotics            encouraged.    ----------------------------     ------------------------------     PCT level increase compared          PCT > 0.5 ng/mL         with peak PCT AND          PCT >= 0.5 ng/mL             Escalation of antibiotics                                          strongly encouraged.      Escalation of antibiotics        strongly encouraged.   Comprehensive metabolic panel     Status: Abnormal   Collection Time: 04/14/17  3:02 AM  Result Value Ref Range   Sodium 132 (L) 135 - 145 mmol/L   Potassium 3.8 3.5 - 5.1 mmol/L   Chloride 101 101 - 111 mmol/L   CO2 22 22 - 32 mmol/L   Glucose, Bld 178 (H) 65 - 99 mg/dL   BUN 12 6 - 20 mg/dL   Creatinine, Ser 1.34 (H) 0.61 - 1.24 mg/dL   Calcium 8.5 (L) 8.9 - 10.3 mg/dL   Total Protein 5.7 (L) 6.5 - 8.1 g/dL   Albumin 2.5 (L) 3.5 - 5.0 g/dL   AST 16 15 - 41 U/L   ALT 13 (L) 17 - 63 U/L   Alkaline Phosphatase 56 38 - 126 U/L   Total Bilirubin 1.1 0.3 - 1.2 mg/dL   GFR calc non Af Wyvonnia Lora  45 (L) >60 mL/min   GFR calc Af Amer 52 (L) >60 mL/min    Comment: (NOTE) The eGFR has been calculated using the CKD EPI equation. This calculation has not been validated in all clinical situations. eGFR's persistently <60 mL/min signify possible Chronic  Kidney Disease.    Anion gap 9 5 - 15  CBC     Status: Abnormal   Collection Time: 04/14/17  3:02 AM  Result Value Ref Range   WBC 9.7 4.0 - 10.5 K/uL   RBC 3.45 (L) 4.22 - 5.81 MIL/uL   Hemoglobin 10.0 (L) 13.0 - 17.0 g/dL   HCT 28.7 (L) 39.0 - 52.0 %   MCV 83.2 78.0 - 100.0 fL   MCH 29.0 26.0 - 34.0 pg   MCHC 34.8 30.0 - 36.0 g/dL   RDW 14.5 11.5 - 15.5 %   Platelets 258 150 - 400 K/uL  Glucose, capillary     Status: Abnormal   Collection Time: 04/14/17  7:15 AM  Result Value Ref Range   Glucose-Capillary 176 (H) 65 - 99 mg/dL  Glucose, capillary     Status: Abnormal   Collection Time: 04/14/17 11:34 AM  Result Value Ref Range   Glucose-Capillary 207 (H) 65 - 99 mg/dL  Glucose, capillary     Status: Abnormal   Collection Time: 04/14/17  4:32 PM  Result Value Ref Range   Glucose-Capillary 157 (H) 65 - 99 mg/dL  Glucose, capillary     Status: Abnormal   Collection Time: 04/14/17  9:21 PM  Result Value Ref Range   Glucose-Capillary 193 (H) 65 - 99 mg/dL  Glucose, capillary     Status: Abnormal   Collection Time: 04/15/17 12:20 AM  Result Value Ref Range   Glucose-Capillary 201 (H) 65 - 99 mg/dL   Comment 1 Document in Chart   Glucose, capillary     Status: Abnormal   Collection Time: 04/15/17  7:27 AM  Result Value Ref Range   Glucose-Capillary 199 (H) 65 - 99 mg/dL  Basic metabolic panel     Status: Abnormal   Collection Time: 04/15/17 10:38 AM  Result Value Ref Range   Sodium 134 (L) 135 - 145 mmol/L   Potassium 3.7 3.5 - 5.1 mmol/L   Chloride 106 101 - 111 mmol/L   CO2 20 (L) 22 - 32 mmol/L   Glucose, Bld 184 (H) 65 - 99 mg/dL   BUN 11 6 - 20 mg/dL   Creatinine, Ser 1.16 0.61 - 1.24 mg/dL   Calcium 8.9 8.9 - 10.3 mg/dL   GFR calc non Af Amer 53 (L) >60 mL/min   GFR calc Af Amer >60 >60 mL/min    Comment: (NOTE) The eGFR has been calculated using the CKD EPI equation. This calculation has not been validated in all clinical situations. eGFR's persistently <60  mL/min signify possible Chronic Kidney Disease.    Anion gap 8 5 - 15  CBC     Status: Abnormal   Collection Time: 04/15/17 10:38 AM  Result Value Ref Range   WBC 7.3 4.0 - 10.5 K/uL   RBC 3.76 (L) 4.22 - 5.81 MIL/uL   Hemoglobin 10.7 (L) 13.0 - 17.0 g/dL   HCT 31.6 (L) 39.0 - 52.0 %   MCV 84.0 78.0 - 100.0 fL   MCH 28.5 26.0 - 34.0 pg   MCHC 33.9 30.0 - 36.0 g/dL   RDW 14.3 11.5 - 15.5 %   Platelets 321 150 - 400 K/uL  Glucose, capillary  Status: Abnormal   Collection Time: 04/15/17 11:18 AM  Result Value Ref Range   Glucose-Capillary 176 (H) 65 - 99 mg/dL  Glucose, capillary     Status: Abnormal   Collection Time: 04/15/17  4:35 PM  Result Value Ref Range   Glucose-Capillary 150 (H) 65 - 99 mg/dL  Glucose, capillary     Status: Abnormal   Collection Time: 04/15/17  9:03 PM  Result Value Ref Range   Glucose-Capillary 204 (H) 65 - 99 mg/dL  CBC     Status: Abnormal   Collection Time: 04/16/17  3:00 AM  Result Value Ref Range   WBC 9.2 4.0 - 10.5 K/uL   RBC 3.65 (L) 4.22 - 5.81 MIL/uL   Hemoglobin 10.1 (L) 13.0 - 17.0 g/dL   HCT 30.4 (L) 39.0 - 52.0 %   MCV 83.3 78.0 - 100.0 fL   MCH 27.7 26.0 - 34.0 pg   MCHC 33.2 30.0 - 36.0 g/dL   RDW 14.0 11.5 - 15.5 %   Platelets 373 150 - 400 K/uL  Hemoglobin A1c     Status: Abnormal   Collection Time: 04/16/17  3:00 AM  Result Value Ref Range   Hgb A1c MFr Bld 7.0 (H) 4.8 - 5.6 %    Comment: (NOTE)         Pre-diabetes: 5.7 - 6.4         Diabetes: >6.4         Glycemic control for adults with diabetes: <7.0    Mean Plasma Glucose 154 mg/dL    Comment: (NOTE) Performed At: [facility] [place], [place] [id number] Lindon Romp MD HA:[id number]   Basic metabolic panel     Status: Abnormal   Collection Time: 04/16/17  3:00 AM  Result Value Ref Range   Sodium 137 135 - 145 mmol/L   Potassium 4.5 3.5 - 5.1 mmol/L    Comment: DELTA CHECK NOTED   Chloride 108 101 - 111 mmol/L   CO2 23 22 - 32  mmol/L   Glucose, Bld 165 (H) 65 - 99 mg/dL   BUN 10 6 - 20 mg/dL   Creatinine, Ser 1.23 0.61 - 1.24 mg/dL   Calcium 8.8 (L) 8.9 - 10.3 mg/dL   GFR calc non Af Amer 49 (L) >60 mL/min   GFR calc Af Amer 57 (L) >60 mL/min    Comment: (NOTE) The eGFR has been calculated using the CKD EPI equation. This calculation has not been validated in all clinical situations. eGFR's persistently <60 mL/min signify possible Chronic Kidney Disease.    Anion gap 6 5 - 15  Lipid panel     Status: Abnormal   Collection Time: 04/16/17  3:00 AM  Result Value Ref Range   Cholesterol 117 0 - 200 mg/dL   Triglycerides 110 <150 mg/dL   HDL 23 (L) >40 mg/dL   Total CHOL/HDL Ratio 5.1 RATIO   VLDL 22 0 - 40 mg/dL   LDL Cholesterol 72 0 - 99 mg/dL    Comment:        Total Cholesterol/HDL:CHD Risk Coronary Heart Disease Risk Table                     Men   Women  1/2 Average Risk   3.4   3.3  Average Risk       5.0   4.4  2 X Average Risk   9.6   7.1  3 X Average Risk  23.4   11.0        Use the calculated Patient Ratio above and the CHD Risk Table to determine the patient's CHD Risk.        ATP III CLASSIFICATION (LDL):  <100     mg/dL   Optimal  100-129  mg/dL   Near or Above                    Optimal  130-159  mg/dL   Borderline  160-189  mg/dL   High  >190     mg/dL   Very High   Glucose, capillary     Status: Abnormal   Collection Time: 04/16/17  7:28 AM  Result Value Ref Range   Glucose-Capillary 184 (H) 65 - 99 mg/dL  Glucose, capillary     Status: Abnormal   Collection Time: 04/16/17 11:41 AM  Result Value Ref Range   Glucose-Capillary 199 (H) 65 - 99 mg/dL  Glucose, capillary     Status: Abnormal   Collection Time: 04/16/17  4:42 PM  Result Value Ref Range   Glucose-Capillary 171 (H) 65 - 99 mg/dL    Assessment/Plan: 1. Cerebrovascular accident (CVA), unspecified mechanism ([place]) Followed by Neurology. Is doing very well. Still with L-sided weakness and neglect. Patient  ambulated around office with walker and did this very well. Repeat labs today. Will place orders for hospital bed, lift chair and threshold ramps to help make home more suitable/safe for him to return to.  - CBC w/Diff - Comp Met (CMET)  2. Fall, initial encounter +History of falls. Is ambulating with walker. High risk -- continue PT outpatient on discharge from SNF. Will also order lift chair and threshold ramps for the home.   3. Chronic indwelling Foley catheter No recurrence of UTI since placement. Reviewed catheter care.  4. Weakness generalized Worsened after CVA. Continue PT. Will try to help family with making home safe environment. Orders as above. Will also send order for [facility] wheelchair so that patient can utilize it to move himself and so that his caregivers who have issues with back pain, can more easily push him in the wheelchair.   Leeanne Rio, PA-C

## 2017-06-28 ENCOUNTER — Encounter: Payer: Self-pay | Admitting: Physician Assistant

## 2017-06-28 LAB — CBC WITH DIFFERENTIAL/PLATELET
BASOS ABS: 119 {cells}/uL (ref 0–200)
Basophils Relative: 0.9 %
Eosinophils Absolute: 620 cells/uL — ABNORMAL HIGH (ref 15–500)
Eosinophils Relative: 4.7 %
HEMATOCRIT: 34.4 % — AB (ref 38.5–50.0)
Hemoglobin: 11.9 g/dL — ABNORMAL LOW (ref 13.2–17.1)
LYMPHS ABS: 2600 {cells}/uL (ref 850–3900)
MCH: 29.6 pg (ref 27.0–33.0)
MCHC: 34.6 g/dL (ref 32.0–36.0)
MCV: 85.6 fL (ref 80.0–100.0)
MPV: 12.3 fL (ref 7.5–12.5)
Monocytes Relative: 6.4 %
NEUTROS PCT: 68.3 %
Neutro Abs: 9016 cells/uL — ABNORMAL HIGH (ref 1500–7800)
Platelets: 334 10*3/uL (ref 140–400)
RBC: 4.02 10*6/uL — ABNORMAL LOW (ref 4.20–5.80)
RDW: 14.7 % (ref 11.0–15.0)
Total Lymphocyte: 19.7 %
WBC: 13.2 10*3/uL — ABNORMAL HIGH (ref 3.8–10.8)
WBCMIX: 845 {cells}/uL (ref 200–950)

## 2017-06-28 LAB — COMPREHENSIVE METABOLIC PANEL
AG RATIO: 1.3 (calc) (ref 1.0–2.5)
ALT: 13 U/L (ref 9–46)
AST: 12 U/L (ref 10–35)
Albumin: 4 g/dL (ref 3.6–5.1)
Alkaline phosphatase (APISO): 66 U/L (ref 40–115)
BILIRUBIN TOTAL: 0.3 mg/dL (ref 0.2–1.2)
BUN/Creatinine Ratio: 11 (calc) (ref 6–22)
BUN: 13 mg/dL (ref 7–25)
CALCIUM: 9.6 mg/dL (ref 8.6–10.3)
CO2: 22 mmol/L (ref 20–32)
Chloride: 94 mmol/L — ABNORMAL LOW (ref 98–110)
Creat: 1.17 mg/dL — ABNORMAL HIGH (ref 0.70–1.11)
Globulin: 3.1 g/dL (calc) (ref 1.9–3.7)
Glucose, Bld: 181 mg/dL — ABNORMAL HIGH (ref 65–99)
POTASSIUM: 5.4 mmol/L — AB (ref 3.5–5.3)
SODIUM: 130 mmol/L — AB (ref 135–146)
TOTAL PROTEIN: 7.1 g/dL (ref 6.1–8.1)

## 2017-07-02 ENCOUNTER — Ambulatory Visit: Payer: Medicare Other | Admitting: Physician Assistant

## 2017-07-03 ENCOUNTER — Encounter: Payer: Self-pay | Admitting: Physician Assistant

## 2017-07-07 ENCOUNTER — Encounter: Payer: Self-pay | Admitting: Physical Therapy

## 2017-07-07 ENCOUNTER — Ambulatory Visit: Payer: Medicare Other | Attending: Neurology | Admitting: Physical Therapy

## 2017-07-07 ENCOUNTER — Encounter: Payer: Self-pay | Admitting: Physician Assistant

## 2017-07-07 DIAGNOSIS — M6281 Muscle weakness (generalized): Secondary | ICD-10-CM | POA: Diagnosis not present

## 2017-07-07 DIAGNOSIS — R2681 Unsteadiness on feet: Secondary | ICD-10-CM | POA: Insufficient documentation

## 2017-07-07 DIAGNOSIS — R2689 Other abnormalities of gait and mobility: Secondary | ICD-10-CM | POA: Insufficient documentation

## 2017-07-07 NOTE — Therapy (Signed)
[facility] [address] [place], [place], [address] Phone: [phone]   Fax:  [phone]  Physical Therapy Evaluation  Patient Details  Name: Gerald Holt. MRN: [id number] Date of Birth: January 13, 1924 Referring Provider: Metta Clines MD  Encounter Date: 07/07/2017      PT End of Session - 07/07/17 2216    Visit Number 1   Number of Visits 17   Date for PT Re-Evaluation 09/05/17   Authorization Type Medicare; Gcode & progress note 10th visits   Authorization Time Period 07/07/17 to 09/05/17   PT Start Time 1315   PT Stop Time 1401   PT Time Calculation (min) 46 min   Equipment Utilized During Treatment Gait belt   Activity Tolerance Patient tolerated treatment well   Behavior During Therapy [facility] for tasks assessed/performed      Past Medical History:  Diagnosis Date  . A-fib ([place])   . C. difficile diarrhea   . Cataracts, bilateral   . Chronic dermatitis   . Diabetes type 2, controlled ([place]) 2000  . History of chicken pox   . Mumps    Adult  . Retinopathy   . Shingles   . Stroke [facility]) Nov 2011  . Undescended testicle, unilateral    Right  . Whooping cough     Past Surgical History:  Procedure Laterality Date  . CARDIAC VALVE REPLACEMENT    . PRE-MALIGNANT / BENIGN SKIN LESION EXCISION    . TESTICLE SURGERY     Right, undescended  . TOOTH EXTRACTION      There were no vitals filed for this visit.       Subjective Assessment - 07/07/17 1322    Subjective I had a stroke recently. I had one in 2011 that effected my left side and this one effected my left side. Wife reports patient is very determined to walk again without assistance of another person. She is supportive of his goal and is purchasing a mat table and // bars for their home. "I know you think I'm crazy, but he wants to walk and he has more grit and determination than anyone you will ever meet."    Patient is accompained by:  Family member  Ellie, wife; caregiver   How long can you stand comfortably? Pt "15 minutes"; Wife "maybe 3 minutes due to weakness"   Patient Stated Goals I want to be able to walk unassisted.    Currently in Pain? No/denies            [facility] PT Assessment - 07/07/17 1325      Assessment   Medical Diagnosis Rt posterior cerebral artery CVA    Referring Provider Metta Clines MD   Onset Date/Surgical Date --  04/15/2017   Hand Dominance Right   Prior Therapy at SNF; waiting for the hospital bed to be able to go home; discharged 06/19/17 from SNF; self-pay for 2x/wk since then     Precautions   Precautions Fall;Other (comment)   Precaution Comments wife, aide describe homonymous hemianopsia with inattention/?neglect of left side     Restrictions   Weight Bearing Restrictions No     Balance Screen   Has the patient fallen in the past 6 months Yes   How many times? 2  once LOB posteriorly; once left unassisted in standing at [facility]   Has the patient had a decrease in activity level because of a fear of falling?  No   Is the patient reluctant to  leave their home because of a fear of falling?  No     Home Ecologist residence   Living Arrangements Spouse/significant other;Other relatives  sister-in-law;    Type of Home Other(Comment)  [place] entrance   [place] One level   [place] - 2 wheels;Cane - single point;Grab bars - toilet;Grab bars - tub/shower;Hand held shower head;Tub bench;Toilet riser;Wheelchair - manual;Hospital bed   Additional Comments bruno valet chair for car transfers     Prior Function   Level of [place] Needs assistance with ADLs;Needs assistance with gait;Needs assistance with transfers   Leisure pianist; woodworking; auto maintenance; walk; read; pet cats     Cognition   Overall Cognitive Status History of cognitive impairments - at baseline     Observation/Other Assessments    Focus on Therapeutic Outcomes (FOTO)  FS 16 (risk adjusted 39)   Stroke Impact Scale  25%     Sensation   Light Touch Appears Intact     Coordination   Gross Motor Movements are Fluid and Coordinated No   Fine Motor Movements are Fluid and Coordinated No     Posture/Postural Control   Posture/Postural Control Postural limitations   Postural Limitations Rounded Shoulders;Forward head;Posterior pelvic tilt     ROM / Strength   AROM / PROM / Strength AROM;Strength     AROM   Overall AROM  Deficits   Overall AROM Comments lt knee lacking 10 degrees extension; ankle DF 10 degrees     Strength   Overall Strength Deficits   Strength Assessment Site Hip;Knee;Ankle   Right/Left Hip Right;Left   Right Hip Flexion 4/5   Right Hip ABduction 4/5  in sitting   Left Hip Flexion 3+/5   Left Hip ABduction 3+/5  in sitting   Right/Left Knee Left;Right   Right Knee Flexion 3+/5   Right Knee Extension 4/5   Left Knee Flexion 3/5   Left Knee Extension 4-/5   Right/Left Ankle Right;Left   Right Ankle Dorsiflexion 5/5   Left Ankle Dorsiflexion 4/5     Transfers   Transfers Sit to Stand;Stand to Sit;Stand Pivot Transfers   Sit to Stand 3: Mod assist;With upper extremity assist;With armrests;From bed   Sit to Stand Details (indicate cue type and reason) vc for sequence; assist due to retropulsion   Five time sit to stand comments  1 min 05 seconds  using armrests and min assist as fatigued.    Stand to Sit 3: Mod assist;With upper extremity assist;With armrests   Stand to Sit Details vc for sequence; assist due to retropulsion   Stand Pivot Transfers 4: Min assist;3: Mod assist   Stand Pivot Transfer Details (indicate cue type and reason) with RW to pt's Rt bed to chair; assist for balance and cues to keep feet farther apart     Ambulation/Gait   Ambulation/Gait No   Gait Comments TBA next visit     Balance   Balance Assessed Yes     Static Sitting Balance   Static Sitting - Balance  Support No upper extremity supported;Feet supported   Static Sitting - Level of Assistance 5: Stand by assistance     Static Standing Balance   Static Standing - Balance Support Bilateral upper extremity supported   Static Standing - Level of Assistance 4: Min assist;3: Mod assist  depending on degree of posterior lean            Objective measurements  completed on examination: See above findings.                  PT Education - 07/07/17 2215    Education provided Yes   Education Details results of PT eval; PT POC; will have exercises to do at home and important for improvement   Person(s) Educated Patient;Spouse;Caregiver(s)   Methods Explanation   Comprehension Verbalized understanding          PT Short Term Goals - 07/07/17 2227      PT SHORT TERM GOAL #1   Title Patient and caregiver will be independent in completing HEP. (Target for all goals 08/06/17)   Time 4   Period Weeks   Status New     PT SHORT TERM GOAL #2   Title Assess gait velocity and set STG/LTG as appropriate.    Time 4   Period Weeks   Status New     PT SHORT TERM GOAL #3   Title Patient will ambulate 50 ft on level indoor surface with LRAD and minimal assistance.    Time 4   Period Weeks   Status New     PT SHORT TERM GOAL #4   Title Patient will decrease 5 x sit to stand test to <55 seconds (with use of armrests from wheelchair) to demonstrate improved LE strength and balance.   Time 4   Period Weeks   Status New           PT Long Term Goals - 07/07/17 2233      PT LONG TERM GOAL #1   Title Patient and caregiver will be independent in completing updated HEP. (Target for all goals 09/05/17)   Time 8   Period Weeks   Status New   Target Date 09/05/17     PT LONG TERM GOAL #2   Title Patient will ambulate 75 ft with LRAD and minguard assist on level indoor surface.    Time 8   Period Weeks   Status New     PT LONG TERM GOAL #3   Title Patient will decrease 5x  sit to stand to <50 seconds (indicative of improved LE strength and balance)   Time 8   Period Weeks   Status New                Plan - 07/07/17 2217    Clinical Impression Statement Patient presents s/p Rt CVA 04/15/17 for OPPT evaluation in wheelchair accompanied by his wife and hired caregiver. He currently resides in SNF with plans to move home as soon as the hospital bed is delivered. Patient is now s/p his second CVA to effect his left side. He currently requires 2 person assist to ambulate with a RW. His functional mobility is impaired in part due to his retropulsion and lack of awareness of true vertical. Anticipate patient can benefit from PT in OP setting to address the deficits listed below via the interventions listed below.   History and Personal Factors relevant to plan of care: PMH-dementia, DM, PAF, CVA, HOH   Personal factors: age, pt's cognitive status, presence of left inattention/neglect   Clinical Presentation Evolving   Clinical Presentation due to: <3 months since acute CVA   Clinical Decision Making Moderate   Rehab Potential Good   Clinical Impairments Affecting Rehab Potential dementia (+support of wife and hired caregiver)   PT Frequency 2x / week   PT Duration 8 weeks   PT Treatment/Interventions ADLs/Self Care Home Management;Functional  mobility training;Stair training;Gait training;DME Instruction;Therapeutic activities;Therapeutic exercise;Balance training;Neuromuscular re-education;Cognitive remediation;Patient/family education;Orthotic Fit/Training;Wheelchair mobility training;Passive range of motion;Visual/perceptual remediation/compensation   PT Next Visit Plan check if MD ordered OT; check gait velocity and update goals as approp; begin HEP for LE strength and balance and pre-gait;    Recommended Other Services Occupational Therapy; ?SLP (will continue to monitor if needed (+dementia)   Consulted and Agree with Plan of Care Patient;Family  member/caregiver   Family Member Consulted wife      Patient will benefit from skilled therapeutic intervention in order to improve the following deficits and impairments:  Abnormal gait, Decreased activity tolerance, Decreased balance, Decreased cognition, Decreased mobility, Decreased knowledge of use of DME, Decreased coordination, Decreased range of motion, Decreased safety awareness, Decreased strength, Difficulty walking, Postural dysfunction, Impaired vision/preception, Impaired UE functional use  Visit Diagnosis: Unsteadiness on feet - Plan: PT plan of care cert/re-cert  Other abnormalities of gait and mobility - Plan: PT plan of care cert/re-cert  Muscle weakness (generalized) - Plan: PT plan of care cert/re-cert      G-Codes - 72/53/66 01-19-2235    Functional Assessment Tool Used (Outpatient Only) 5x sit to stand 64minute 05 seconds;    Functional Limitation Mobility: Walking and moving around   Mobility: Walking and Moving Around Current Status [phone]) At least 60 percent but less than 80 percent impaired, limited or restricted   Mobility: Walking and Moving Around Goal Status [phone]) At least 20 percent but less than 40 percent impaired, limited or restricted       Problem List Patient Active Problem List   Diagnosis Date Noted  . Cerebrovascular accident (CVA) ([place]) 04/15/2017  . Acute on chronic diastolic CHF (congestive heart failure) ([place])   . Atrial fibrillation with rapid ventricular response ([place]) 04/11/2017  . Lactic acidosis 04/11/2017  . Atrial flutter with rapid ventricular response ([place]) 04/11/2017  . Leukocytosis 03/23/2017  . Essential hypertension 03/07/2017  . Weakness generalized 02/18/2017  . Chronic indwelling Foley catheter 11/22/2016  . Enlarged prostate 11/22/2016  . Urinary retention 10/31/2016  . Diabetes mellitus with complication ([place])   . Renal insufficiency 10/25/2016  . Hx: UTI (urinary tract infection) 10/20/2016  . Hypothyroidism  09/15/2016  . Gastroesophageal reflux disease without esophagitis 07/09/2015  . Falls 06/22/2015  . Diabetes mellitus type II, uncontrolled ([place]) 05/29/2015  . Paroxysmal atrial fibrillation ([place]) 05/29/2015  . HLD (hyperlipidemia) 05/29/2015  . Hx of completed stroke 05/29/2015    Rexanne Mano, PT 07/08/2017, 5:23 AM  [facility] [address] [place], [place], [address] Phone: [phone]   Fax:  [phone]  Name: Gerald Holt. MRN: [id number] Date of Birth: [birth year]

## 2017-07-08 ENCOUNTER — Other Ambulatory Visit: Payer: Self-pay

## 2017-07-08 ENCOUNTER — Telehealth: Payer: Self-pay | Admitting: Physical Therapy

## 2017-07-08 DIAGNOSIS — R2681 Unsteadiness on feet: Secondary | ICD-10-CM

## 2017-07-08 NOTE — Telephone Encounter (Signed)
Dr. Tomi Likens,  Mr. Gerald Holt was evaluated by Physical Therapy on 07/07/17.  The patient would benefit from OT evaluation for ADLs.    If you agree, please place an order in [facility] workque in [facility] or fax the order to [phone]. Thank you,   Barry Brunner, PT Outpatient Neurorehabilitation [address], [place], [place] [address] [phone]

## 2017-07-08 NOTE — Telephone Encounter (Signed)
Sandi, could we please order OT evaluation for ADLs for [name]?  thanks

## 2017-07-08 NOTE — Progress Notes (Signed)
mb

## 2017-07-09 ENCOUNTER — Ambulatory Visit: Payer: Medicare Other | Admitting: Physical Therapy

## 2017-07-09 ENCOUNTER — Encounter: Payer: Self-pay | Admitting: Physician Assistant

## 2017-07-12 ENCOUNTER — Encounter: Payer: Self-pay | Admitting: Physician Assistant

## 2017-07-15 ENCOUNTER — Ambulatory Visit: Payer: Medicare Other | Admitting: Physical Therapy

## 2017-07-15 ENCOUNTER — Encounter: Payer: Self-pay | Admitting: Physical Therapy

## 2017-07-15 DIAGNOSIS — R2681 Unsteadiness on feet: Secondary | ICD-10-CM

## 2017-07-15 DIAGNOSIS — M6281 Muscle weakness (generalized): Secondary | ICD-10-CM | POA: Diagnosis not present

## 2017-07-15 DIAGNOSIS — R2689 Other abnormalities of gait and mobility: Secondary | ICD-10-CM | POA: Diagnosis not present

## 2017-07-15 NOTE — Therapy (Signed)
[facility] [address] [place], [place], [address] Phone: [phone]   Fax:  [phone]  Physical Therapy Treatment  Patient Details  Name: Gerald Holt. MRN: [id number] Date of Birth: 1924-01-23 Referring Provider: Metta Clines MD  Encounter Date: 07/15/2017      PT End of Session - 07/15/17 1737    Visit Number 2   Number of Visits 17   Date for PT Re-Evaluation 09/05/17   Authorization Type Medicare; Gcode & progress note 10th visits   Authorization Time Period 07/07/17 to 09/05/17   PT Start Time 1315   PT Stop Time 1404   PT Time Calculation (min) 49 min   Equipment Utilized During Treatment Gait belt   Activity Tolerance Patient tolerated treatment well   Behavior During Therapy [facility] for tasks assessed/performed      Past Medical History:  Diagnosis Date  . A-fib ([place])   . C. difficile diarrhea   . Cataracts, bilateral   . Chronic dermatitis   . Diabetes type 2, controlled ([place]) 2000  . History of chicken pox   . Mumps    Adult  . Retinopathy   . Shingles   . Stroke [facility]) Nov 2011  . Undescended testicle, unilateral    Right  . Whooping cough     Past Surgical History:  Procedure Laterality Date  . CARDIAC VALVE REPLACEMENT    . PRE-MALIGNANT / BENIGN SKIN LESION EXCISION    . TESTICLE SURGERY     Right, undescended  . TOOTH EXTRACTION      There were no vitals filed for this visit.      Subjective Assessment - 07/15/17 1319    Subjective Patient was discharged home yesterday. All is going well. Wife reports she was given a HEP from SNF but she forgot to bring it today.   Patient is accompained by: Family member  Ellie, wife; Mickel Baas, caregiver   Pertinent History Rt CVA x 2; hemianopsia, inattention   How long can you stand comfortably? Pt "15 minutes"; Wife "maybe 3 minutes due to weakness"   Patient Stated Goals I want to be able to walk unassisted.    Currently in  Pain? No/denies                         [facility] Adult PT Treatment/Exercise - 07/15/17 1730      Transfers   Transfers Sit to Stand;Stand to Sit;Squat Pivot Transfers   Sit to Stand 4: Min assist;With upper extremity assist   Sit to Stand Details (indicate cue type and reason) vc for safe hand placement and to scoot out to edge of seat   Stand to Sit 4: Min assist   Stand to Sit Details to control descent; vc to reach back for UE use   Squat Pivot Transfers 4: Min assist;With upper extremity assistance   Squat Pivot Transfer Details (indicate cue type and reason) to his rt, no RW; good sequencing     Ambulation/Gait   Ambulation/Gait Yes   Ambulation/Gait Assistance 4: Min assist   Ambulation/Gait Assistance Details for maneuvering RW and maintaining balance   Ambulation Distance (Feet) 120 Feet  15   Assistive device Rolling walker   Gait Pattern Step-through pattern;Decreased weight shift to left;Left foot flat;Scissoring;Trunk flexed;Narrow base of support   Ambulation Surface Level     Exercises   Exercises Lumbar;Knee/Hip     Lumbar Exercises: Stretches   Active Hamstring Stretch  1 rep;30 seconds  seated EOB   Passive Hamstring Stretch 2 reps;30 seconds   Passive Hamstring Stretch Limitations supine; PT raising leg vs trying to have pt hold strap behind thigh to support leg and then extend knee     Lumbar Exercises: Supine   Bridge 10 reps  10 seconds; bil LEs, then each leg unilaterally                PT Education - 07/15/17 1736    Education provided Yes   Education Details initial HEP exercises, need to at least stand every hour while awake (with walking every hour the goal)   Person(s) Educated Patient;Caregiver(s);Spouse   Methods Explanation;Demonstration;Verbal cues;Handout   Comprehension Verbalized understanding;Returned demonstration;Verbal cues required;Need further instruction          PT Short Term Goals - 07/07/17 2227       PT SHORT TERM GOAL #1   Title Patient and caregiver will be independent in completing HEP. (Target for all goals 08/06/17)   Time 4   Period Weeks   Status New     PT SHORT TERM GOAL #2   Title Assess gait velocity and set STG/LTG as appropriate.    Time 4   Period Weeks   Status New     PT SHORT TERM GOAL #3   Title Patient will ambulate 50 ft on level indoor surface with LRAD and minimal assistance.    Time 4   Period Weeks   Status New     PT SHORT TERM GOAL #4   Title Patient will decrease 5 x sit to stand test to <55 seconds (with use of armrests from wheelchair) to demonstrate improved LE strength and balance.   Time 4   Period Weeks   Status New           PT Long Term Goals - 07/07/17 2233      PT LONG TERM GOAL #1   Title Patient and caregiver will be independent in completing updated HEP. (Target for all goals 09/05/17)   Time 8   Period Weeks   Status New   Target Date 09/05/17     PT LONG TERM GOAL #2   Title Patient will ambulate 75 ft with LRAD and minguard assist on level indoor surface.    Time 8   Period Weeks   Status New     PT LONG TERM GOAL #3   Title Patient will decrease 5x sit to stand to <50 seconds (indicative of improved LE strength and balance)   Time 8   Period Weeks   Status New               Plan - 07/15/17 1738    Clinical Impression Statement Session focused on educating pt, spouse and caregiver on HEP, safe transfers, safe walking and safe use of DME. Patient did better today with transfers and gait with wife reporting "he has good days and not-so-good days." Patient able to follow all instructions and will have good caregiver support to complete HEP and continue to progress.    Rehab Potential Good   Clinical Impairments Affecting Rehab Potential dementia (+support of wife and hired caregiver)   PT Frequency 2x / week   PT Duration 8 weeks   PT Treatment/Interventions ADLs/Self Care Home Management;Functional mobility  training;Stair training;Gait training;DME Instruction;Therapeutic activities;Therapeutic exercise;Balance training;Neuromuscular re-education;Cognitive remediation;Patient/family education;Orthotic Fit/Training;Wheelchair mobility training;Passive range of motion;Visual/perceptual remediation/compensation   PT Next Visit Plan check gait velocity and update goals  as approp; add to HEP for LE strength and balance (?standing at sink hip exercises?)   Consulted and Agree with Plan of Care Patient;Family member/caregiver   Family Member Consulted wife      Patient will benefit from skilled therapeutic intervention in order to improve the following deficits and impairments:  Abnormal gait, Decreased activity tolerance, Decreased balance, Decreased cognition, Decreased mobility, Decreased knowledge of use of DME, Decreased coordination, Decreased range of motion, Decreased safety awareness, Decreased strength, Difficulty walking, Postural dysfunction, Impaired vision/preception, Impaired UE functional use  Visit Diagnosis: Unsteadiness on feet  Muscle weakness (generalized)     Problem List Patient Active Problem List   Diagnosis Date Noted  . Cerebrovascular accident (CVA) ([place]) 04/15/2017  . Acute on chronic diastolic CHF (congestive heart failure) ([place])   . Atrial fibrillation with rapid ventricular response ([place]) 04/11/2017  . Lactic acidosis 04/11/2017  . Atrial flutter with rapid ventricular response ([place]) 04/11/2017  . Leukocytosis 03/23/2017  . Essential hypertension 03/07/2017  . Weakness generalized 02/18/2017  . Chronic indwelling Foley catheter 11/22/2016  . Enlarged prostate 11/22/2016  . Urinary retention 10/31/2016  . Diabetes mellitus with complication ([place])   . Renal insufficiency 10/25/2016  . Hx: UTI (urinary tract infection) 10/20/2016  . Hypothyroidism 09/15/2016  . Gastroesophageal reflux disease without esophagitis 07/09/2015  . Falls 06/22/2015  . Diabetes  mellitus type II, uncontrolled ([place]) 05/29/2015  . Paroxysmal atrial fibrillation ([place]) 05/29/2015  . HLD (hyperlipidemia) 05/29/2015  . Hx of completed stroke 05/29/2015    Gerald Holt, PT 07/15/2017, 5:42 PM  [facility] [address] [place], [place], [address] Phone: [phone]   Fax:  [phone]  Name: Gerald Holt. MRN: [id number] Date of Birth: 1924-07-31

## 2017-07-15 NOTE — Patient Instructions (Signed)
Bridge   Lie back, legs bent knees apart. Inhale, pressing hips up. Count out loud to 10. Exhale, rolling down. Repeat 10 times. Do 1 sessions per day.  Copyright  VHI. All rights reserve  Bridging (Single Leg)    Lie on back with feet shoulder width apart and right leg straight and resting on the bed. Lift hips toward the ceiling while keeping leg straight. Hold __10__ seconds. Repeat __10__ times. Repeat on the other leg. Do ___1_ sessions per day.  http://gt2.exer.us/358   Copyright  VHI. All rights reserved.   HIP: Hamstrings - Short Sitting    Rest leg on raised surface or the floor. Keep knee straight. Lift chest. Hold _30__ seconds. _3__ reps per set, _1__ sets per day, __5_ days per week. Do on each leg  Copyright  VHI. All rights reserved.

## 2017-07-16 ENCOUNTER — Encounter: Payer: Self-pay | Admitting: Physician Assistant

## 2017-07-18 ENCOUNTER — Ambulatory Visit (INDEPENDENT_AMBULATORY_CARE_PROVIDER_SITE_OTHER): Payer: Medicare Other | Admitting: Physician Assistant

## 2017-07-18 ENCOUNTER — Telehealth: Payer: Self-pay | Admitting: Cardiology

## 2017-07-18 ENCOUNTER — Ambulatory Visit: Payer: Medicare Other | Admitting: Physical Therapy

## 2017-07-18 ENCOUNTER — Encounter: Payer: Self-pay | Admitting: Physician Assistant

## 2017-07-18 VITALS — BP 140/62 | HR 68 | Temp 97.6°F | Resp 14 | Ht 66.0 in | Wt 160.0 lb

## 2017-07-18 DIAGNOSIS — W19XXXA Unspecified fall, initial encounter: Secondary | ICD-10-CM | POA: Diagnosis not present

## 2017-07-18 DIAGNOSIS — I10 Essential (primary) hypertension: Secondary | ICD-10-CM

## 2017-07-18 DIAGNOSIS — Z978 Presence of other specified devices: Secondary | ICD-10-CM

## 2017-07-18 DIAGNOSIS — I480 Paroxysmal atrial fibrillation: Secondary | ICD-10-CM | POA: Diagnosis not present

## 2017-07-18 DIAGNOSIS — Z9289 Personal history of other medical treatment: Secondary | ICD-10-CM

## 2017-07-18 DIAGNOSIS — I5033 Acute on chronic diastolic (congestive) heart failure: Secondary | ICD-10-CM

## 2017-07-18 DIAGNOSIS — I639 Cerebral infarction, unspecified: Secondary | ICD-10-CM | POA: Diagnosis not present

## 2017-07-18 DIAGNOSIS — Z960 Presence of urogenital implants: Secondary | ICD-10-CM

## 2017-07-18 MED ORDER — DILTIAZEM HCL ER COATED BEADS 240 MG PO CP24: 240.0000 mg | ORAL_CAPSULE | Freq: Every day | ORAL | 1 refills | Status: DC

## 2017-07-18 MED ORDER — FUROSEMIDE 20 MG PO TABS: 20.0000 mg | ORAL_TABLET | Freq: Every day | ORAL | 5 refills | Status: DC

## 2017-07-18 MED ORDER — LEVOTHYROXINE SODIUM 50 MCG PO TABS: 50.0000 ug | ORAL_TABLET | Freq: Every day | ORAL | 1 refills | Status: DC

## 2017-07-18 MED ORDER — APIXABAN 5 MG PO TABS: 5.0000 mg | ORAL_TABLET | Freq: Two times a day (BID) | ORAL | 0 refills | Status: DC

## 2017-07-18 NOTE — Patient Instructions (Signed)
I will write the letter and send it to you once you give me the final items.  Go to the lab today. I will call you with your results.  We will alter regimen accordingly.   Continue all medications as directed.  Call Dr. Jacalyn Lefevre office to discuss the amiodarone and changes to that. Also for the Eliquis samples. I will work on getting approved through insurance.   Please schedule a follow-up with Dr. Stanford Breed as directed as well.  Follow-up with me in 3-4 weeks.

## 2017-07-18 NOTE — Telephone Encounter (Signed)
New Message  Patient calling the office for samples of medication:   1.  What medication and dosage are you requesting samples for? eliquis   2.  Are you currently out of this medication?

## 2017-07-18 NOTE — Telephone Encounter (Signed)
New Message  [place] PA with [facility] call to get samples for pt. Please call back to discuss  Patient calling the office for samples of medication:   1.  What medication and dosage are you requesting samples for? eliquis 5mg   2.  Are you currently out of this medication? yes

## 2017-07-18 NOTE — Progress Notes (Signed)
Pre visit review using our clinic review tool, if applicable. No additional management support is needed unless otherwise documented below in the visit note. 

## 2017-07-18 NOTE — Progress Notes (Signed)
Patient presents to clinic today with family for follow-up. Patient has finally made it home from SNF where he was placed after his CVA. Has done very well and was discharged from SNF over a week ago but has remained there as a guest until home was ready to accommodate them. Wife (primary caregiver) notes that they have obtained an adjustable hospital bed, handicap rails, lift chair, shower transfer mat. Already have obtained an adjustable motorized seat for their van. Caregiver notes that she has not heard from [facility] regarding wheelchair.   Patient needing refills of all medications after discharge from SNF. Has updated medication list for review. Is on Amiodarone and wife notes mild ankle swelling for which he was started on Lasix in SNF. Wants to talk about options other than Amiodarone.   Past Medical History:  Diagnosis Date  . A-fib ([place])   . C. difficile diarrhea   . Cataracts, bilateral   . Chronic dermatitis   . Diabetes type 2, controlled ([place]) 2000  . History of chicken pox   . Mumps    Adult  . Retinopathy   . Shingles   . Stroke [facility]) Nov 2011  . Undescended testicle, unilateral    Right  . Whooping cough     Current Outpatient Prescriptions on File Prior to Visit  Medication Sig Dispense Refill  . amiodarone (PACERONE) 200 MG tablet Take 1 tablet (200 mg total) by mouth 2 (two) times daily. Discontinue after 04/23/17 doses.    Marland Kitchen apixaban (ELIQUIS) 5 MG TABS tablet Take 1 tablet (5 mg total) by mouth 2 (two) times daily.    . Calcium Carbonate Antacid (ALKA-SELTZER ANTACID PO) Take 2 tablets by mouth daily as needed (for indegestion).     . cyanocobalamin (,VITAMIN B-12,) 1000 MCG/ML injection Inject 1,000 mcg into the muscle every 30 (thirty) days.    Marland Kitchen diltiazem (CARDIZEM CD) 240 MG 24 hr capsule Take 1 capsule (240 mg total) by mouth daily.    Marland Kitchen docusate sodium (COLACE) 100 MG capsule Take 200 mg by mouth daily as needed for mild constipation.     Marland Kitchen  glucose blood test strip One Touch Verio strips Use as instructed to check fasting sugars once daily .Dx:E11.9 100 each 12  . levothyroxine (SYNTHROID, LEVOTHROID) 50 MCG tablet Take 1 tablet (50 mcg total) by mouth daily. 90 tablet 3  . metFORMIN (GLUCOPHAGE-XR) 500 MG 24 hr tablet TAKE 2 TABLETS WITH BREAKFAST AND AT BEDTIME. Start 04/17/17.    . Miconazole Nitrate (TRIPLE PASTE AF) 2 % OINT Apply 1 application topically daily as needed (jock itch).     Marland Kitchen omeprazole (PRILOSEC) 20 MG capsule Take 20 mg by mouth daily.    . traZODone (DESYREL) 50 MG tablet Take 25 mg by mouth at bedtime.     No current facility-administered medications on file prior to visit.     Allergies  Allergen Reactions  . Flomax [Tamsulosin Hcl] Other (See Comments)    Hypotension  . Shellfish Allergy Anaphylaxis  . Aspirin-Dipyridamole Er Other (See Comments)    Headaches   . Other Other (See Comments)    Blood Thinner given in Rehab gave H/As - Not Coumadin/Headaches      Family History  Problem Relation Age of Onset  . Pneumonia Mother 68       Deceased  . GI Bleed Father 82       Deceased - Ulcers  . Diabetes Cousin   . Diabetes Brother   .  Cancer Paternal Aunt     Social History   Social History  . Marital status: Married    Spouse name: N/A  . Number of children: N/A  . Years of education: N/A   Social History Main Topics  . Smoking status: Former Research scientist (life sciences)  . Smokeless tobacco: Never Used     Comment: Hasn't smoked since age 25  . Alcohol use No  . Drug use: No  . Sexual activity: No   Other Topics Concern  . None   Social History Narrative   Lives with wife in a one story home.  Has 1 child.  Retired Armed forces technical officer.  Education: Oceanographer.     Review of Systems - See HPI.  All other ROS are negative.  BP 140/62   Pulse 68   Temp 97.6 F (36.4 C) (Oral)   Resp 14   Ht '5\' 6"'  (1.676 m)   Wt 160 lb (72.6 kg)   SpO2 98%   BMI 25.82 kg/m   Physical Exam  Constitutional: He is  oriented to person, place, and time and well-developed, well-nourished, and in no distress.  HENT:  Head: Normocephalic and atraumatic.  Eyes: Conjunctivae are normal.  Neck: Neck supple.  Cardiovascular: Normal rate, regular rhythm, normal heart sounds and intact distal pulses.   Pulmonary/Chest: Effort normal and breath sounds normal. No respiratory distress. He has no wheezes. He has no rales. He exhibits no tenderness.  Neurological: He is alert and oriented to person, place, and time. He has intact cranial nerves. No cranial nerve deficit.  Strength of R upper and lower extremities 5/5. Strength of L lower extremity 4/5, L upper extremity 3/5. Still noted neglect of left hand/arm.  Walks well with walker -- improved from last visit.  Skin: Skin is warm and dry. No rash noted.  Psychiatric: Affect normal.  Vitals reviewed.  Recent Results (from the past 2160 hour(s))  CBC w/Diff     Status: Abnormal   Collection Time: 06/27/17  3:59 PM  Result Value Ref Range   WBC 13.2 (H) 3.8 - 10.8 Thousand/uL   RBC 4.02 (L) 4.20 - 5.80 Million/uL   Hemoglobin 11.9 (L) 13.2 - 17.1 g/dL   HCT 34.4 (L) 38.5 - 50.0 %   MCV 85.6 80.0 - 100.0 fL   MCH 29.6 27.0 - 33.0 pg   MCHC 34.6 32.0 - 36.0 g/dL   RDW 14.7 11.0 - 15.0 %   Platelets 334 140 - 400 Thousand/uL   MPV 12.3 7.5 - 12.5 fL   Neutro Abs 9,016 (H) 1,500 - 7,800 cells/uL   Lymphs Abs 2,600 850 - 3,900 cells/uL   WBC mixed population 845 200 - 950 cells/uL   Eosinophils Absolute 620 (H) 15 - 500 cells/uL   Basophils Absolute 119 0 - 200 cells/uL   Neutrophils Relative % 68.3 %   Total Lymphocyte 19.7 %   Monocytes Relative 6.4 %   Eosinophils Relative 4.7 %   Basophils Relative 0.9 %  Comp Met (CMET)     Status: Abnormal   Collection Time: 06/27/17  3:59 PM  Result Value Ref Range   Glucose, Bld 181 (H) 65 - 99 mg/dL    Comment: .            Fasting reference interval . For someone without known diabetes, a glucose value >125  mg/dL indicates that they may have diabetes and this should be confirmed with a follow-up test. .    BUN 13 7 - 25  mg/dL   Creat 1.17 (H) 0.70 - 1.11 mg/dL    Comment: For patients >81 years of age, the reference limit for Creatinine is approximately 13% higher for people identified as African-American. .    BUN/Creatinine Ratio 11 6 - 22 (calc)   Sodium 130 (L) 135 - 146 mmol/L   Potassium 5.4 (H) 3.5 - 5.3 mmol/L   Chloride 94 (L) 98 - 110 mmol/L   CO2 22 20 - 32 mmol/L   Calcium 9.6 8.6 - 10.3 mg/dL   Total Protein 7.1 6.1 - 8.1 g/dL   Albumin 4.0 3.6 - 5.1 g/dL   Globulin 3.1 1.9 - 3.7 g/dL (calc)   AG Ratio 1.3 1.0 - 2.5 (calc)   Total Bilirubin 0.3 0.2 - 1.2 mg/dL   Alkaline phosphatase (APISO) 66 40 - 115 U/L   AST 12 10 - 35 U/L   ALT 13 9 - 46 U/L   Assessment/Plan: 1. Essential hypertension BP acceptable today giving age. Mild ankle edema. Lungs CTAB. Continue medication regimen. She is to speak with Cardiology regarding the amiodarone. Repeat labs today. - Basic metabolic panel - TSH  2. Cerebrovascular accident (CVA), unspecified mechanism ([place]) Doing very well. Is ambulating well with walker. They have in-home care 12 hours per day.   3. Paroxysmal atrial fibrillation (HCC) NSR today. Continue current regimen. Follow-up with Cardiology. We are working on Tier exception for [employer].   4. Acute on chronic diastolic CHF (congestive heart failure) (HCC) Mild ankle edema. Lungs CTAB. Continue current regimen.   5. Fall, initial encounter + history of instability at baseline, worsened with stroke. Continue home PT/OT. He is making improvements that were not expected which is tremendous!  6. Chronic indwelling Foley catheter Draining well. No skin breakdown.    Leeanne Rio, PA-C

## 2017-07-21 ENCOUNTER — Other Ambulatory Visit (INDEPENDENT_AMBULATORY_CARE_PROVIDER_SITE_OTHER): Payer: Medicare Other

## 2017-07-21 ENCOUNTER — Ambulatory Visit: Payer: Medicare Other

## 2017-07-21 ENCOUNTER — Encounter: Payer: Self-pay | Admitting: Physician Assistant

## 2017-07-21 DIAGNOSIS — Z5181 Encounter for therapeutic drug level monitoring: Secondary | ICD-10-CM | POA: Diagnosis not present

## 2017-07-21 MED ORDER — METFORMIN HCL ER 500 MG PO TB24: ORAL_TABLET | ORAL | 0 refills | Status: DC

## 2017-07-21 NOTE — Telephone Encounter (Signed)
Call and leave message letting pt know that samples of eliquis left at front.

## 2017-07-22 ENCOUNTER — Other Ambulatory Visit: Payer: Self-pay | Admitting: Physician Assistant

## 2017-07-22 ENCOUNTER — Encounter: Payer: Self-pay | Admitting: Physician Assistant

## 2017-07-22 DIAGNOSIS — I10 Essential (primary) hypertension: Secondary | ICD-10-CM

## 2017-07-22 LAB — HEPATIC FUNCTION PANEL
ALT: 13 U/L (ref 0–53)
AST: 16 U/L (ref 0–37)
Albumin: 4.1 g/dL (ref 3.5–5.2)
Alkaline Phosphatase: 57 U/L (ref 39–117)
BILIRUBIN TOTAL: 0.4 mg/dL (ref 0.2–1.2)
Bilirubin, Direct: 0.1 mg/dL (ref 0.0–0.3)
Total Protein: 7.1 g/dL (ref 6.0–8.3)

## 2017-07-22 LAB — C-REACTIVE PROTEIN: CRP: 0.2 mg/dL — AB (ref 0.5–20.0)

## 2017-07-23 ENCOUNTER — Encounter: Payer: Self-pay | Admitting: Physician Assistant

## 2017-07-23 ENCOUNTER — Other Ambulatory Visit: Payer: Self-pay | Admitting: Physician Assistant

## 2017-07-23 ENCOUNTER — Ambulatory Visit: Payer: Medicare Other | Admitting: Physical Therapy

## 2017-07-23 MED ORDER — DILTIAZEM HCL ER COATED BEADS 240 MG PO CP24: 240.0000 mg | ORAL_CAPSULE | Freq: Every day | ORAL | 1 refills | Status: DC

## 2017-07-23 MED ORDER — LEVOTHYROXINE SODIUM 50 MCG PO TABS: 50.0000 ug | ORAL_TABLET | Freq: Every day | ORAL | 1 refills | Status: DC

## 2017-07-23 MED ORDER — METFORMIN HCL ER 500 MG PO TB24: ORAL_TABLET | ORAL | 1 refills | Status: DC

## 2017-07-23 MED ORDER — APIXABAN 5 MG PO TABS: 5.0000 mg | ORAL_TABLET | Freq: Two times a day (BID) | ORAL | 1 refills | Status: DC

## 2017-07-25 ENCOUNTER — Ambulatory Visit: Payer: Medicare Other | Attending: Neurology | Admitting: Physical Therapy

## 2017-07-25 ENCOUNTER — Ambulatory Visit: Payer: Medicare Other | Admitting: Occupational Therapy

## 2017-07-25 ENCOUNTER — Ambulatory Visit: Payer: Medicare Other | Admitting: Physician Assistant

## 2017-07-25 ENCOUNTER — Encounter: Payer: Self-pay | Admitting: Physical Therapy

## 2017-07-25 ENCOUNTER — Encounter: Payer: Self-pay | Admitting: Physician Assistant

## 2017-07-25 DIAGNOSIS — R41844 Frontal lobe and executive function deficit: Secondary | ICD-10-CM | POA: Insufficient documentation

## 2017-07-25 DIAGNOSIS — M6281 Muscle weakness (generalized): Secondary | ICD-10-CM | POA: Insufficient documentation

## 2017-07-25 DIAGNOSIS — R2681 Unsteadiness on feet: Secondary | ICD-10-CM | POA: Diagnosis not present

## 2017-07-25 DIAGNOSIS — R2689 Other abnormalities of gait and mobility: Secondary | ICD-10-CM | POA: Diagnosis not present

## 2017-07-25 DIAGNOSIS — R531 Weakness: Secondary | ICD-10-CM | POA: Insufficient documentation

## 2017-07-25 DIAGNOSIS — R278 Other lack of coordination: Secondary | ICD-10-CM

## 2017-07-25 NOTE — Therapy (Signed)
Gerald Holt [address] [place], [place], [address] Phone: [phone]   Fax:  [phone]  Occupational Therapy Evaluation  Patient Details  Name: Gerald Holt. MRN: [id number] Date of Birth: Dec 07, 1923 Referring Provider: Dr. Tomi Likens  Encounter Date: 07/25/2017      OT End of Session - 07/25/17 1558    Visit Number 1   Number of Visits 17   Date for OT Re-Evaluation 09/23/17   Authorization Type Medicare   Authorization - Visit Number 1   Authorization - Number of Visits 10   OT Start Time 1105   OT Stop Time 1145   OT Time Calculation (min) 40 min   Activity Tolerance Patient tolerated treatment well   Behavior During Therapy [facility] for tasks assessed/performed      Past Medical History:  Diagnosis Date  . A-fib ([place])   . C. difficile diarrhea   . Cataracts, bilateral   . Chronic dermatitis   . Diabetes type 2, controlled ([place]) 2000  . History of chicken pox   . Mumps    Adult  . Retinopathy   . Shingles   . Stroke [facility]) Nov 2011  . Undescended testicle, unilateral    Right  . Whooping cough     Past Surgical History:  Procedure Laterality Date  . CARDIAC VALVE REPLACEMENT    . PRE-MALIGNANT / BENIGN SKIN LESION EXCISION    . TESTICLE SURGERY     Right, undescended  . TOOTH EXTRACTION      There were no vitals filed for this visit.      Subjective Assessment - 07/25/17 1110    Subjective  Pt is a 83 y.o s/p CVA on 04/15/17   Pertinent History     PMH-dementia, DM, PAF, CVA, HOH Personal factors: age, pt's cognitive status, presence of left inattention/neglect, Recent CVA 04/15/17, hx of CVA 2011   Patient Stated Goals regain functional use of LUE for playing piano   Currently in Pain? No/denies           [facility] OT Assessment - 07/25/17 1114      Assessment   Diagnosis CVA   Referring Provider Dr. Tomi Likens   Onset Date 04/15/17   Prior Therapy SNF     Precautions   Precautions Fall;Other (comment)   Precaution Comments wife, aide describe homonymous hemianopsia with inattention/?neglect of left side     Restrictions   Weight Bearing Restrictions No     Home  Environment   Family/patient expects to be discharged to: Private residence   [place] entrance   Lives With Spouse  has hired caregiver     Prior Function   Level of Independence Independent   Vocation Requirements pianist     ADL   Eating/Feeding Needs assist with cutting food   Grooming Supervision/safety   Upper Body Bathing + 1 Total asssestance   Lower Body Bathing + 1 Total assistance   Upper Body Dressing Maximal assistance   Lower Body Dressing +1 Total aassistance   Toilet Transfer Minimal assistance   Tub/Shower Transfer Moderate assistance     Mobility   Mobility Status Needs assist     Written Expression   Dominant Hand Right     Vision Assessment   Vision Assessment Vision impaired  _ to be further tested in functional context   Visual Fields Left homonymous Hemainpsia   Comment Pt misses items on left side     Cognition   Overall  Cognitive Status Impaired/Different from baseline   Area of Impairment Memory;Safety/judgement;Awareness   Memory Impaired   Memory Impairment Decreased short term memory   Awareness Impaired   Awareness Impairment --  decreased safety awareness     Observation/Other Assessments   Stroke Impact Scale  25%     Sensation   Light Touch Impaired by gross assessment     Coordination   Fine Motor Movements are Fluid and Coordinated No   9 Hole Peg Test Right;Left   Right 9 Hole Peg Test 54.19 secs  Pt required verbal cues to locate holes on left side   Left 9 Hole Peg Test 73.28 secs  difficulty related to dexterity more than vision     ROM / Strength   AROM / PROM / Strength AROM     AROM   Overall AROM  Deficits   Overall AROM Comments shoulder flexion RUE 120, LUE 95, left shoulder abduction 95, mild limitation  in LUE forearm supination and wrist extension.     Hand Function   Right Hand Grip (lbs) 40    Left Hand Grip (lbs) 33                           OT Short Term Goals - 07/25/17 1614      OT SHORT TERM GOAL #1   Title Patient / caregiver I with HEP- 08/23/17   Time 4   Period Weeks   Status New     OT SHORT TERM GOAL #2   Title Pt will donn shirt with min A/ v.c   Time 4   Period Weeks   Status New     OT SHORT TERM GOAL #3   Title Pt will perfrom UB bathing with min A/ VC.    Time 4   Period Weeks   Status New     OT SHORT TERM GOAL #4   Title Pt will perform tabletop scanning tasks with 75% accuracy in prep for ADLs.   Time 4   Period Weeks   Status New     OT SHORT TERM GOAL #5   Title Pt will perform shower transfers conssitnetly with min A.   Time 4   Period Weeks           OT Long Term Goals - 07/25/17 1616      OT LONG TERM GOAL #1   Title Pt will demonstrate improved LUE coordination  as evidenced by perfroming 9 hole peg test in 63 secs or less- due 09/21/17   Baseline RUE 54.19, LUE 73.28 secs   Time 8     OT LONG TERM GOAL #2   Title Pt demonstrate adequate standing balance to assist with pulling up pants with minguard assist for balance.   Time 8   Period Weeks   Status New     OT LONG TERM GOAL #3   Title Pt will donn pants with mod A   Time 8   Period Weeks   Status New     OT LONG TERM GOAL #4   Title Pt will perfrom tabletop scanning with 85% accuracy of better for increased ease with ADLs.   Time 8   Period Weeks   Status New     OT LONG TERM GOAL #5   Title Pt will consistently use LUE as a non dominant assist for ADLs, and playing the piano   Time 8   Period Weeks  Status New     OT LONG TERM GOAL #6   Title Pt will demonstrate ability to retrieve a lightweight object at 110 shoulder flexion.   Time 8   Period Weeks   Status New               Plan - 07/25/17 1559    Clinical Impression  Statement Pt is a 83 y.o male s/p CVA 04/15/17. Pt went to SNF after hospitalization for rehab. He is now at home with his wife and hired caregiver. Pt presents with the following deficits: LUE weakness, decreased coordination, L homonymous hemianopsia, cognitive deficits, decreased balance, decreased functional mobility. Pt can benefit from skilled occupational therapy to maximize pt's safety and indpdnence with ADLs/ IADLS and to maintain quality of life.    Occupational Profile and client history currently impacting functional performance PMH-dementia, DM, PAF, CVA, HOH Personal factors: age, pt's cognitive status, presence of left inattention/neglect. Pt is a Radiographer, therapeutic. He walked 2 miles per day prior to his CVA. Pt was independent prior to CVA.   Occupational performance deficits (Please refer to evaluation for details): ADL's;IADL's;Work;Play;Leisure;Social Participation   Rehab Potential Fair   Current Impairments/barriers affecting progress: severity of deficits, L homonymous hemianopsia, cognitive deficits, advanced age   OT Frequency 2x / week  plus eval   OT Duration 8 weeks   OT Treatment/Interventions Self-care/ADL training;Moist Heat;Fluidtherapy;DME and/or AE instruction;Splinting;Patient/family education;Balance training;Therapeutic exercises;Ultrasound;Therapeutic exercise;Therapeutic activities;Cognitive remediation/compensation;Passive range of motion;Functional Mobility Training;Neuromuscular education;Electrical Stimulation;Cryotherapy;Parrafin;Energy conservation;Manual Therapy;Visual/perceptual remediation/compensation   Plan initiate coordination HEP for LUE   Clinical Decision Making Several treatment options, min-mod task modification necessary   Consulted and Agree with Plan of Care Patient;Family member/caregiver   Family Member Consulted wife and hired caregiver      Patient will benefit from skilled therapeutic intervention in order to improve the following deficits and  impairments:  Abnormal gait, Decreased coordination, Decreased range of motion, Difficulty walking, Impaired flexibility, Decreased safety awareness, Decreased endurance, Decreased activity tolerance, Decreased knowledge of precautions, Impaired UE functional use, Decreased balance, Decreased knowledge of use of DME, Impaired vision/preception, Impaired perceived functional ability, Decreased strength, Decreased mobility, Decreased cognition  Visit Diagnosis: Muscle weakness (generalized) - Plan: Ot plan of care cert/re-cert  Other lack of coordination - Plan: Ot plan of care cert/re-cert  Frontal lobe and executive function deficit - Plan: Ot plan of care cert/re-cert  Other abnormalities of gait and mobility - Plan: Ot plan of care cert/re-cert  Unsteadiness on feet - Plan: Ot plan of care cert/re-cert      G-Codes - 09/24/70 1622    Functional Assessment Tool Used (Outpatient only) clinical impressions: mod A shower transfers, max-total assist for bathing and dressing   Functional Limitation Self care   Self Care Current Status (Z3664) At least 80 percent but less than 100 percent impaired, limited or restricted   Self Care Goal Status (Q0347) At least 60 percent but less than 80 percent impaired, limited or restricted      Problem List Patient Active Problem List   Diagnosis Date Noted  . Cerebrovascular accident (CVA) ([place]) 04/15/2017  . Acute on chronic diastolic CHF (congestive heart failure) ([place])   . Leukocytosis 03/23/2017  . Essential hypertension 03/07/2017  . Weakness generalized 02/18/2017  . Chronic indwelling Foley catheter 11/22/2016  . Enlarged prostate 11/22/2016  . Urinary retention 10/31/2016  . Diabetes mellitus with complication ([place])   . Renal insufficiency 10/25/2016  . Hx: UTI (urinary tract infection) 10/20/2016  . Hypothyroidism 09/15/2016  .  Gastroesophageal reflux disease without esophagitis 07/09/2015  . Falls 06/22/2015  . Diabetes mellitus type  II, uncontrolled ([place]) 05/29/2015  . Paroxysmal atrial fibrillation ([place]) 05/29/2015  . HLD (hyperlipidemia) 05/29/2015  . Hx of completed stroke 05/29/2015    Krissi Willaims 07/25/2017, 4:28 PM Theone Murdoch, OTR/L [phone] Phone: [phone] 4:28 PM 07/25/17 [facility] [address] [place], [place], [address] Phone: [phone]   Fax:  [phone]  Name: Mikko Lewellen. MRN: [id number] Date of Birth: Jun 16, 1924

## 2017-07-25 NOTE — Therapy (Signed)
[facility] [address] [place], [place], [address] Phone: [phone]   Fax:  [phone]  Physical Therapy Treatment  Patient Details  Name: Gerald Holt. MRN: [id number] Date of Birth: Jun 20, 1924 Referring Provider: Metta Clines MD  Encounter Date: 07/25/2017      PT End of Session - 07/25/17 2058    Visit Number 3   Number of Visits 17   Date for PT Re-Evaluation 09/05/17   Authorization Type Medicare; Gcode & progress note 10th visits   Authorization Time Period 07/07/17 to 09/05/17   PT Start Time 1145   PT Stop Time 1238   PT Time Calculation (min) 53 min   Equipment Utilized During Treatment Gait belt   Activity Tolerance Patient tolerated treatment well   Behavior During Therapy [facility] for tasks assessed/performed      Past Medical History:  Diagnosis Date  . A-fib ([place])   . C. difficile diarrhea   . Cataracts, bilateral   . Chronic dermatitis   . Diabetes type 2, controlled ([place]) 2000  . History of chicken pox   . Mumps    Adult  . Retinopathy   . Shingles   . Stroke [facility]) Nov 2011  . Undescended testicle, unilateral    Right  . Whooping cough     Past Surgical History:  Procedure Laterality Date  . CARDIAC VALVE REPLACEMENT    . PRE-MALIGNANT / BENIGN SKIN LESION EXCISION    . TESTICLE SURGERY     Right, undescended  . TOOTH EXTRACTION      There were no vitals filed for this visit.      Subjective Assessment - 07/25/17 1149    Subjective Wife reports it takes about an hour to go through the exercises he received from the SNF. Reports yesterday he was tired afterwards and had to take a nap. Describes it as a "bad tired" that has to do with his heart.    Patient is accompained by: Family member  Gerald Holt, wife; Gerald Holt, caregiver   Pertinent History Rt CVA x 2; hemianopsia, inattention   How long can you stand comfortably? Pt "15 minutes"; Wife "maybe 3 minutes due to weakness"    Patient Stated Goals I want to be able to walk unassisted.                          [facility] Adult PT Treatment/Exercise - 07/25/17 0001      Transfers   Transfers Sit to Stand;Stand to Sit;Squat Pivot Transfers   Sit to Stand 4: Min assist   Sit to Stand Details (indicate cue type and reason) vc for safe hand placement and to scoot out to edge of seat   Stand to Sit 4: Min assist   Stand to Sit Details to control descent; vc to reach back for UE use     Ambulation/Gait   Ambulation/Gait Assistance 4: Min assist   Ambulation/Gait Assistance Details vc for upright posture and looking ahead; assist for maneurving RW through obstacles and completing turns   Ambulation Distance (Feet) 40 Feet  x 6   Assistive device Rolling walker   Gait Pattern Step-through pattern;Decreased weight shift to left;Left foot flat;Scissoring;Trunk flexed;Narrow base of support   Ambulation Surface Level;Indoor   Gait Comments wife reports area where pt can walk at home he has to make 180 degree turns, his feel tend to tangle      Knee/Hip Exercises: Standing  Hip Flexion AROM;Stengthening   Hip Abduction AROM;Stengthening;Both;1 set;10 reps                  PT Short Term Goals - 07/07/17 2227      PT SHORT TERM GOAL #1   Title Patient and caregiver will be independent in completing HEP. (Target for all goals 08/06/17)   Time 4   Period Weeks   Status New     PT SHORT TERM GOAL #2   Title Assess gait velocity and set STG/LTG as appropriate.    Time 4   Period Weeks   Status New     PT SHORT TERM GOAL #3   Title Patient will ambulate 50 ft on level indoor surface with LRAD and minimal assistance.    Time 4   Period Weeks   Status New     PT SHORT TERM GOAL #4   Title Patient will decrease 5 x sit to stand test to <55 seconds (with use of armrests from wheelchair) to demonstrate improved LE strength and balance.   Time 4   Period Weeks   Status New            PT Long Term Goals - 07/07/17 2233      PT LONG TERM GOAL #1   Title Patient and caregiver will be independent in completing updated HEP. (Target for all goals 09/05/17)   Time 8   Period Weeks   Status New   Target Date 09/05/17     PT LONG TERM GOAL #2   Title Patient will ambulate 75 ft with LRAD and minguard assist on level indoor surface.    Time 8   Period Weeks   Status New     PT LONG TERM GOAL #3   Title Patient will decrease 5x sit to stand to <50 seconds (indicative of improved LE strength and balance)   Time 8   Period Weeks   Status New               Plan - 07/25/17 2100    Clinical Impression Statement Focused on HEP wife brought in (pt had from teh hospital). Updated exercises that were too easy for pt and educated caregivew to assist); Patient contiues to make slow, steady gains   Rehab Potential Good   Clinical Impairments Affecting Rehab Potential dementia (+support of wife and hired caregiver)   PT Frequency 2x / week   PT Duration 8 weeks   PT Treatment/Interventions ADLs/Self Care Home Management;Functional mobility training;Stair training;Gait training;DME Instruction;Therapeutic activities;Therapeutic exercise;Balance training;Neuromuscular re-education;Cognitive remediation;Patient/family education;Orthotic Fit/Training;Wheelchair mobility training;Passive range of motion;Visual/perceptual remediation/compensation   PT Next Visit Plan check gait velocity and update goals as approp; add to HEP for LE strength and balance (?standing at sink hip exercises?)   Consulted and Agree with Plan of Care Patient;Family member/caregiver   Family Member Consulted wife      Patient will benefit from skilled therapeutic intervention in order to improve the following deficits and impairments:  Abnormal gait, Decreased activity tolerance, Decreased balance, Decreased cognition, Decreased mobility, Decreased knowledge of use of DME, Decreased coordination, Decreased  range of motion, Decreased safety awareness, Decreased strength, Difficulty walking, Postural dysfunction, Impaired vision/preception, Impaired UE functional use  Visit Diagnosis: Unsteadiness on feet  Muscle weakness (generalized)  Other abnormalities of gait and mobility     Problem List Patient Active Problem List   Diagnosis Date Noted  . Cerebrovascular accident (CVA) ([place]) 04/15/2017  . Acute on chronic diastolic  CHF (congestive heart failure) ([place])   . Leukocytosis 03/23/2017  . Essential hypertension 03/07/2017  . Weakness generalized 02/18/2017  . Chronic indwelling Foley catheter 11/22/2016  . Enlarged prostate 11/22/2016  . Urinary retention 10/31/2016  . Diabetes mellitus with complication ([place])   . Renal insufficiency 10/25/2016  . Hx: UTI (urinary tract infection) 10/20/2016  . Hypothyroidism 09/15/2016  . Gastroesophageal reflux disease without esophagitis 07/09/2015  . Falls 06/22/2015  . Diabetes mellitus type II, uncontrolled ([place]) 05/29/2015  . Paroxysmal atrial fibrillation ([place]) 05/29/2015  . HLD (hyperlipidemia) 05/29/2015  . Hx of completed stroke 05/29/2015    Rexanne Mano, PT 07/25/2017, 9:11 PM  [facility] [address] [place], [place], [address] Phone: [phone]   Fax:  [phone]  Name: Gerald Holt. MRN: [id number] Date of Birth: 1924-09-06

## 2017-07-27 DIAGNOSIS — Z7982 Long term (current) use of aspirin: Secondary | ICD-10-CM | POA: Diagnosis not present

## 2017-07-27 DIAGNOSIS — E119 Type 2 diabetes mellitus without complications: Secondary | ICD-10-CM | POA: Diagnosis not present

## 2017-07-27 DIAGNOSIS — G459 Transient cerebral ischemic attack, unspecified: Secondary | ICD-10-CM | POA: Diagnosis not present

## 2017-07-27 DIAGNOSIS — R531 Weakness: Secondary | ICD-10-CM | POA: Diagnosis not present

## 2017-07-27 DIAGNOSIS — Z79899 Other long term (current) drug therapy: Secondary | ICD-10-CM | POA: Diagnosis not present

## 2017-07-27 DIAGNOSIS — I69354 Hemiplegia and hemiparesis following cerebral infarction affecting left non-dominant side: Secondary | ICD-10-CM | POA: Diagnosis not present

## 2017-07-27 DIAGNOSIS — I6789 Other cerebrovascular disease: Secondary | ICD-10-CM | POA: Diagnosis not present

## 2017-07-27 DIAGNOSIS — Z7984 Long term (current) use of oral hypoglycemic drugs: Secondary | ICD-10-CM | POA: Diagnosis not present

## 2017-07-27 DIAGNOSIS — R0982 Postnasal drip: Secondary | ICD-10-CM | POA: Diagnosis not present

## 2017-07-27 DIAGNOSIS — W06XXXA Fall from bed, initial encounter: Secondary | ICD-10-CM | POA: Diagnosis not present

## 2017-07-27 DIAGNOSIS — Y998 Other external cause status: Secondary | ICD-10-CM | POA: Diagnosis not present

## 2017-07-27 DIAGNOSIS — S0990XA Unspecified injury of head, initial encounter: Secondary | ICD-10-CM | POA: Diagnosis not present

## 2017-07-27 DIAGNOSIS — Z7901 Long term (current) use of anticoagulants: Secondary | ICD-10-CM | POA: Diagnosis not present

## 2017-07-27 DIAGNOSIS — J329 Chronic sinusitis, unspecified: Secondary | ICD-10-CM | POA: Diagnosis not present

## 2017-07-28 ENCOUNTER — Encounter: Payer: Self-pay | Admitting: Occupational Therapy

## 2017-07-28 ENCOUNTER — Encounter: Payer: Self-pay | Admitting: Physical Therapy

## 2017-07-28 ENCOUNTER — Ambulatory Visit: Payer: Medicare Other | Admitting: Occupational Therapy

## 2017-07-28 ENCOUNTER — Ambulatory Visit: Payer: Medicare Other | Admitting: Physical Therapy

## 2017-07-28 DIAGNOSIS — R278 Other lack of coordination: Secondary | ICD-10-CM | POA: Diagnosis not present

## 2017-07-28 DIAGNOSIS — R2681 Unsteadiness on feet: Secondary | ICD-10-CM | POA: Diagnosis not present

## 2017-07-28 DIAGNOSIS — M6281 Muscle weakness (generalized): Secondary | ICD-10-CM | POA: Diagnosis not present

## 2017-07-28 DIAGNOSIS — R41844 Frontal lobe and executive function deficit: Secondary | ICD-10-CM | POA: Diagnosis not present

## 2017-07-28 DIAGNOSIS — R531 Weakness: Secondary | ICD-10-CM

## 2017-07-28 DIAGNOSIS — R2689 Other abnormalities of gait and mobility: Secondary | ICD-10-CM

## 2017-07-28 NOTE — Patient Instructions (Signed)
Sit to Stand Transfers:  1. Scoot out to the edge of the chair 2. Place your feet flat on the floor, shoulder width apart.  Make sure your feet are tucked just under your knees. 3. Lean forward (nose over toes) with momentum, and stand up tall with your best posture.  If you need to use your arms, use them as a quick boost up to stand. 4. If you are in a low or soft chair, you can lean back and then forward up to stand, in order to get more momentum. 5. Once you are standing, make sure you are looking ahead and standing tall.  To sit down:  1. Back up until you feel the chair behind your legs. 2. Bend at you hips, reaching  Back for you chair, if needed, then slowly squat to sit down on your chair.   Functional Quadriceps: Sit to Stand    Scoot out to edge of chair, feet flat on floor. Stand upright, extending knees fully. Repeat _10___ times per set. Do __2__ sessions per day.  http://orth.exer.us/734   Copyright  VHI. All rights reserved.

## 2017-07-28 NOTE — Therapy (Signed)
[facility] [address] [place], [place], [address] Phone: [phone]   Fax:  [phone]  Occupational Therapy Treatment  Patient Details  Name: Gerald Holt. MRN: [id number] Date of Birth: September 23, 1924 Referring Provider: Dr. Tomi Likens   Encounter Date: 07/28/2017  OT End of Session - 07/28/17 1247    Visit Number  2    Number of Visits  17    Date for OT Re-Evaluation  09/23/17    Authorization Type  Medicare    Authorization - Visit Number  2    Authorization - Number of Visits  10    OT Start Time  1150    OT Stop Time  1230    OT Time Calculation (min)  40 min    Activity Tolerance  Patient tolerated treatment well    Behavior During Therapy  [facility] for tasks assessed/performed       Past Medical History:  Diagnosis Date  . A-fib ([place])   . C. difficile diarrhea   . Cataracts, bilateral   . Chronic dermatitis   . Diabetes type 2, controlled ([place]) 2000  . History of chicken pox   . Mumps    Adult  . Retinopathy   . Shingles   . Stroke [facility]) Nov 2011  . Undescended testicle, unilateral    Right  . Whooping cough     Past Surgical History:  Procedure Laterality Date  . CARDIAC VALVE REPLACEMENT    . PRE-MALIGNANT / BENIGN SKIN LESION EXCISION    . TESTICLE SURGERY     Right, undescended  . TOOTH EXTRACTION      There were no vitals filed for this visit.  Subjective Assessment - 07/28/17 1203    Subjective   I had a fall and hit my head.      Pertinent History      PMH-dementia, DM, PAF, CVA, HOH Personal factors: age, pt's cognitive status, presence of left inattention/neglect, Recent CVA 04/15/17, hx of CVA 2011    Patient Stated Goals  regain functional use of LUE for playing piano    Currently in Pain?  No/denies                   OT Treatments/Exercises (OP) - 07/28/17 0001      ADLs   UB Dressing  Patient able to doff front opening flannel jacket today with  increased time while seated.  Patient able to retrieve/ tuck handkerchief in his chest pocket with his left hand without assistance.      Functional Mobility  Worked on functional sit to stand and static stand balance. Patient with significant posterior bias in standing, patient anxious with forward weight shift onto feet.  Patient consistently needed reminders to activate left leg into surface for better alignment in standing.  Patient able to verbalize- I feel like I will take a nose dive, when aligned over his feet.  Does best with repetition.      Leisure  Patient is over compensating for sensory loss, and visual deficits when placing fingers on the piano keyboard.  Patient feels he does best when he can see how his fingers interact with keyboard.  Wife reports left hand is heavy and slower than right hand for piano playing.               OT Education - 07/28/17 1246    Education provided  Yes    Education Details  Reviewed all OT goals with  patient, caregiver Mickel Baas and Wife - Normand Sloop.      Person(s) Educated  Patient;Spouse;Caregiver(s)    Methods  Explanation    Comprehension  Verbalized understanding       OT Short Term Goals - 07/28/17 1249      OT SHORT TERM GOAL #1   Title  Patient / caregiver I with HEP- 08/23/17    Status  On-going      OT SHORT TERM GOAL #2   Title  Pt will donn shirt with min A/ v.c    Status  On-going      OT SHORT TERM GOAL #3   Title  Pt will perfrom UB bathing with min A/ VC.     Status  On-going      OT SHORT TERM GOAL #4   Title  Pt will perform tabletop scanning tasks with 75% accuracy in prep for ADLs.    Status  On-going      OT SHORT TERM GOAL #5   Title  Pt will perform shower transfers consistently with min A.    Status  On-going        OT Long Term Goals - 07/28/17 1250      OT LONG TERM GOAL #1   Title  Pt will demonstrate improved LUE coordination  as evidenced by perfroming 9 hole peg test in 63 secs or less- due 09/21/17     Status  On-going      OT LONG TERM GOAL #2   Title  Pt demonstrate adequate standing balance to assist with pulling up pants with minguard assist for balance.    Status  On-going      OT LONG TERM GOAL #3   Title  Pt will donn pants with mod A    Status  On-going      OT LONG TERM GOAL #4   Title  Pt will perfrom tabletop scanning with 85% accuracy of better for increased ease with ADLs.    Status  On-going      OT LONG TERM GOAL #5   Title  Pt will consistently use LUE as a non dominant assist for ADLs, and playing the piano    Status  On-going      OT LONG TERM GOAL #6   Title  Pt will demonstrate ability to retrieve a lightweight object at 110 shoulder flexion.    Status  On-going            Plan - 07/28/17 1247    Clinical Impression Statement  Patient has significant balance issues which impede safety at home.  Patient fell out of bed yesterday and had an ED visit.  Improving balance is of primary importance to patient, wife and caregiver.  Patient is beginning to spontaneously compensate for visual field deficit occasionally, although this remains a significant deficit area.      Rehab Potential  Fair    Current Impairments/barriers affecting progress:  severity of deficits, L homonymous hemianopsia, cognitive deficits, advanced age    OT Frequency  2x / week    OT Duration  8 weeks    OT Treatment/Interventions  Self-care/ADL training;Moist Heat;Fluidtherapy;DME and/or AE instruction;Splinting;Patient/family education;Balance training;Therapeutic exercises;Ultrasound;Therapeutic exercise;Therapeutic activities;Cognitive remediation/compensation;Passive range of motion;Functional Mobility Training;Neuromuscular education;Electrical Stimulation;Cryotherapy;Parrafin;Energy conservation;Manual Therapy;Visual/perceptual remediation/compensation    Plan  Initiate coordiantion exercise program    Consulted and Agree with Plan of Care  Patient;Family member/caregiver    Family  Member Consulted  wife, Normand Sloop,  and hired caregiver, Mickel Baas  Patient will benefit from skilled therapeutic intervention in order to improve the following deficits and impairments:  Abnormal gait, Decreased coordination, Decreased range of motion, Difficulty walking, Impaired flexibility, Decreased safety awareness, Decreased endurance, Decreased activity tolerance, Decreased knowledge of precautions, Impaired UE functional use, Decreased balance, Decreased knowledge of use of DME, Impaired vision/preception, Impaired perceived functional ability, Decreased strength, Decreased mobility, Decreased cognition  Visit Diagnosis: Weakness generalized  Other lack of coordination  Frontal lobe and executive function deficit  Other abnormalities of gait and mobility  Unsteadiness on feet    Problem List Patient Active Problem List   Diagnosis Date Noted  . Cerebrovascular accident (CVA) ([place]) 04/15/2017  . Acute on chronic diastolic CHF (congestive heart failure) ([place])   . Leukocytosis 03/23/2017  . Essential hypertension 03/07/2017  . Weakness generalized 02/18/2017  . Chronic indwelling Foley catheter 11/22/2016  . Enlarged prostate 11/22/2016  . Urinary retention 10/31/2016  . Diabetes mellitus with complication ([place])   . Renal insufficiency 10/25/2016  . Hx: UTI (urinary tract infection) 10/20/2016  . Hypothyroidism 09/15/2016  . Gastroesophageal reflux disease without esophagitis 07/09/2015  . Falls 06/22/2015  . Diabetes mellitus type II, uncontrolled ([place]) 05/29/2015  . Paroxysmal atrial fibrillation ([place]) 05/29/2015  . HLD (hyperlipidemia) 05/29/2015  . Hx of completed stroke 05/29/2015    Mariah Milling , OTR/L 07/28/2017, 12:52 PM  [facility] [address] [place], [place], [address] Phone: [phone]   Fax:  [phone]  Name: Gerald Holt. MRN: [id number] Date of Birth: May 06, 1924

## 2017-07-28 NOTE — Therapy (Signed)
[facility] [address] [place], [place], [address] Phone: [phone]   Fax:  [phone]  Physical Therapy Treatment  Patient Details  Name: Gerald Holt. MRN: [id number] Date of Birth: 06-29-1924 Referring Provider: Metta Clines MD   Encounter Date: 07/28/2017  PT End of Session - 07/28/17 1338    Visit Number  4    Number of Visits  17    Date for PT Re-Evaluation  09/05/17    Authorization Type  Medicare; Gcode & progress note 10th visits    Authorization Time Period  07/07/17 to 09/05/17    PT Start Time  5027    PT Stop Time  1320    PT Time Calculation (min)  45 min    Equipment Utilized During Treatment  Gait belt    Activity Tolerance  Patient tolerated treatment well    Behavior During Therapy  [facility] for tasks assessed/performed       Past Medical History:  Diagnosis Date  . A-fib ([place])   . C. difficile diarrhea   . Cataracts, bilateral   . Chronic dermatitis   . Diabetes type 2, controlled ([place]) 2000  . History of chicken pox   . Mumps    Adult  . Retinopathy   . Shingles   . Stroke [facility]) Nov 2011  . Undescended testicle, unilateral    Right  . Whooping cough     Past Surgical History:  Procedure Laterality Date  . CARDIAC VALVE REPLACEMENT    . PRE-MALIGNANT / BENIGN SKIN LESION EXCISION    . TESTICLE SURGERY     Right, undescended  . TOOTH EXTRACTION      There were no vitals filed for this visit.  Subjective Assessment - 07/28/17 1239    Subjective  Wife reports pt slid off the edge of the bed (not 100% sure what happened as no one witnessed). Went to ED via EMS and had "head scan" and released home. LUE a little sore.     Patient is accompained by:  Family member Gerald Holt, wife; Gerald Holt, caregiver   Gerald Holt, wife; Gerald Holt, caregiver   Pertinent History  Rt CVA x 2; hemianopsia, inattention    How long can you stand comfortably?  Pt "15 minutes"; Wife "maybe 3 minutes due to  weakness"    Patient Stated Goals  I want to be able to walk unassisted.     Currently in Pain?  No/denies                      [facility] Adult PT Treatment/Exercise - 07/28/17 1330      Transfers   Transfers  Sit to Stand;Stand to Sit;Squat Pivot Transfers    Sit to Stand  4: Min assist    Sit to Stand Details (indicate cue type and reason)  multiple reps with min progressign to minguard assist (for anterior wt shift    Stand to Sit  4: Min assist    Stand to Sit Details  to control descent; vc to reach back for UE use    Transfer Cueing  practiced walking to different chairs, w/c, mat with turning to sit. Pt consistently begins to turn too soon, steps around too far and then has to take ~8 steps backwards to get to the surface      Ambulation/Gait   Ambulation/Gait Assistance  4: Min assist    Ambulation/Gait Assistance Details  assist to manuever RW over thresholds, turning to  sit; major LOB x 1 as turning to sit    Ambulation Distance (Feet)  10 Feet 10, 20, 20, 20   10, 20, 20, 20   Assistive device  Rolling walker    Gait Pattern  Step-through pattern;Decreased weight shift to left;Left foot flat;Trunk flexed;Narrow base of support;Decreased step length - right;Decreased step length - left    Gait velocity  10 ft/9.93 sec= 1.01 ft/sec    Gait Comments  --      Posture/Postural Control   Posture/Postural Control  Postural limitations    Postural Limitations  Rounded Shoulders;Forward head;Posterior pelvic tilt    Posture Comments  vc with standing and walking to correct      Knee/Hip Exercises: Aerobic   Nustep  L5, LEs only; 3:30 with 2 breaks due to foot slipping and strap adjusted under heel; L      Knee/Hip Exercises: Standing   Hip Flexion  AROM;Stengthening    Hip Abduction  AROM;Stengthening;Both;1 set;10 reps             PT Education - 07/28/17 1337    Education provided  Yes    Education Details  see HEP; sequence/timing for turning to sit     Person(s) Educated  Patient;Spouse;Caregiver(s)    Methods  Explanation;Demonstration;Tactile cues;Verbal cues    Comprehension  Verbalized understanding;Verbal cues required;Tactile cues required;Need further instruction       PT Short Term Goals - 07/28/17 1344      PT SHORT TERM GOAL #1   Title  Patient and caregiver will be independent in completing HEP. (Target for all goals 08/06/17)    Time  4    Period  Weeks    Status  New      PT SHORT TERM GOAL #2   Title  Assess gait velocity and set STG/LTG as appropriate.     Baseline  11/5  1.01 ft/sec    Time  4    Period  Weeks    Status  Achieved      PT SHORT TERM GOAL #3   Title  Patient will ambulate 50 ft on level indoor surface with LRAD and minimal assistance.     Time  4    Period  Weeks    Status  New      PT SHORT TERM GOAL #4   Title  Patient will decrease 5 x sit to stand test to <55 seconds (with use of armrests from wheelchair) to demonstrate improved LE strength and balance.    Time  4    Period  Weeks    Status  New      PT SHORT TERM GOAL #5   Title  Patient will improve gait velocity to 1.30 ft/sec to demonstrate progression toward ultimate goal of >1.8 ft/sec (target date also 08/06/17)    Time  2    Period  Weeks    Status  New        PT Long Term Goals - 07/07/17 2233      PT LONG TERM GOAL #1   Title  Patient and caregiver will be independent in completing updated HEP. (Target for all goals 09/05/17)    Time  8    Period  Weeks    Status  New    Target Date  09/05/17      PT LONG TERM GOAL #2   Title  Patient will ambulate 75 ft with LRAD and minguard assist on level indoor surface.  Time  8    Period  Weeks    Status  New      PT LONG TERM GOAL #3   Title  Patient will decrease 5x sit to stand to <50 seconds (indicative of improved LE strength and balance)    Time  8    Period  Weeks    Status  New            Plan - 07/28/17 1339    Clinical Impression Statement  Patient  more fearful of standing and walking since he slid off EOB to the floor over the weekend. Progressed from standing with RW to marching with RW to ambulating short distances. Patient remained fearful however was willing to attempt all tasks PT requested. Patient can continue to benefit from PT.    Rehab Potential  Good    Clinical Impairments Affecting Rehab Potential  dementia (+support of wife and hired caregiver)    PT Frequency  2x / week    PT Duration  8 weeks    PT Treatment/Interventions  ADLs/Self Care Home Management;Functional mobility training;Stair training;Gait training;DME Instruction;Therapeutic activities;Therapeutic exercise;Balance training;Neuromuscular re-education;Cognitive remediation;Patient/family education;Orthotic Fit/Training;Wheelchair mobility training;Passive range of motion;Visual/perceptual remediation/compensation    PT Next Visit Plan  ? walk in // bars to improve posture (mirror) and incr conficence; then progress back to RW; LE strength and balance; ? standing at sink hip exercises    Consulted and Agree with Plan of Care  Patient;Family member/caregiver    Family Member Consulted  wife       Patient will benefit from skilled therapeutic intervention in order to improve the following deficits and impairments:  Abnormal gait, Decreased activity tolerance, Decreased balance, Decreased cognition, Decreased mobility, Decreased knowledge of use of DME, Decreased coordination, Decreased range of motion, Decreased safety awareness, Decreased strength, Difficulty walking, Postural dysfunction, Impaired vision/preception, Impaired UE functional use  Visit Diagnosis: Other abnormalities of gait and mobility  Unsteadiness on feet     Problem List Patient Active Problem List   Diagnosis Date Noted  . Cerebrovascular accident (CVA) ([place]) 04/15/2017  . Acute on chronic diastolic CHF (congestive heart failure) ([place])   . Leukocytosis 03/23/2017  . Essential  hypertension 03/07/2017  . Weakness generalized 02/18/2017  . Chronic indwelling Foley catheter 11/22/2016  . Enlarged prostate 11/22/2016  . Urinary retention 10/31/2016  . Diabetes mellitus with complication ([place])   . Renal insufficiency 10/25/2016  . Hx: UTI (urinary tract infection) 10/20/2016  . Hypothyroidism 09/15/2016  . Gastroesophageal reflux disease without esophagitis 07/09/2015  . Falls 06/22/2015  . Diabetes mellitus type II, uncontrolled ([place]) 05/29/2015  . Paroxysmal atrial fibrillation ([place]) 05/29/2015  . HLD (hyperlipidemia) 05/29/2015  . Hx of completed stroke 05/29/2015    Rexanne Mano. PT 07/28/2017, 1:53 PM  [facility] [address] [place], [place], [address] Phone: [phone]   Fax:  [phone]  Name: Gerald Holt. MRN: [id number] Date of Birth: Jul 06, 1924

## 2017-07-29 ENCOUNTER — Encounter: Payer: Self-pay | Admitting: Physician Assistant

## 2017-07-31 ENCOUNTER — Encounter: Payer: Self-pay | Admitting: Occupational Therapy

## 2017-07-31 ENCOUNTER — Encounter: Payer: Self-pay | Admitting: Physical Therapy

## 2017-07-31 ENCOUNTER — Ambulatory Visit: Payer: Medicare Other | Admitting: Physical Therapy

## 2017-07-31 ENCOUNTER — Ambulatory Visit: Payer: Medicare Other | Admitting: Occupational Therapy

## 2017-07-31 DIAGNOSIS — R278 Other lack of coordination: Secondary | ICD-10-CM

## 2017-07-31 DIAGNOSIS — R41844 Frontal lobe and executive function deficit: Secondary | ICD-10-CM

## 2017-07-31 DIAGNOSIS — R531 Weakness: Secondary | ICD-10-CM

## 2017-07-31 DIAGNOSIS — R2689 Other abnormalities of gait and mobility: Secondary | ICD-10-CM

## 2017-07-31 DIAGNOSIS — R2681 Unsteadiness on feet: Secondary | ICD-10-CM | POA: Diagnosis not present

## 2017-07-31 DIAGNOSIS — M6281 Muscle weakness (generalized): Secondary | ICD-10-CM

## 2017-07-31 NOTE — Therapy (Signed)
[facility] [address] [place], [place], [address] Phone: [phone]   Fax:  [phone]  Occupational Therapy Treatment  Patient Details  Name: Gerald Holt. MRN: [id number] Date of Birth: [birth year] Referring Provider: Dr. Tomi Likens   Encounter Date: 07/31/2017  OT End of Session - 07/31/17 1556    Visit Number  3    Number of Visits  17    Date for OT Re-Evaluation  09/23/17    Authorization Type  Medicare    Authorization - Visit Number  3    Authorization - Number of Visits  10    OT Start Time  1401    OT Stop Time  1443    OT Time Calculation (min)  42 min    Activity Tolerance  Patient tolerated treatment well       Past Medical History:  Diagnosis Date  . A-fib ([place])   . C. difficile diarrhea   . Cataracts, bilateral   . Chronic dermatitis   . Diabetes type 2, controlled ([place]) 2000  . History of chicken pox   . Mumps    Adult  . Retinopathy   . Shingles   . Stroke [facility]) Nov 2011  . Undescended testicle, unilateral    Right  . Whooping cough     Past Surgical History:  Procedure Laterality Date  . CARDIAC VALVE REPLACEMENT    . PRE-MALIGNANT / BENIGN SKIN LESION EXCISION    . TESTICLE SURGERY     Right, undescended  . TOOTH EXTRACTION      There were no vitals filed for this visit.  Subjective Assessment - 07/31/17 1408    Subjective   My balance isn't very good    Patient is accompained by:  Family member wife and caregiver    Pertinent History      PMH-dementia, DM, PAF, CVA, HOH Personal factors: age, pt's cognitive status, presence of left inattention/neglect, Recent CVA 04/15/17, hx of CVA 2011    Patient Stated Goals  regain functional use of LUE for playing piano    Currently in Pain?  No/denies                   OT Treatments/Exercises (OP) - 07/31/17 0001      Neurological Re-education Exercises   Other Exercises 1  Neuro re ed to address sit to  stand with emphasis on set up prior to standing, and cues to "power up" through LE"s which assists pt in engagng his core for standng balance.  Incorporated small weight shifts (A/P as well as lateral ) through task demand and cues to power up and stand tall.  Addressed static and dynamic standing balance first with unilateral UE support and progressing to no UE support with return to unilateral support.  Initially pt required min-mod a for standing balance however by end of session pt required only close supervision for standing balance with one UE support during functional task and intermittent min a with no UE support.  Educated wife and caregiver on how to incorporate techqniques into hiking pants and transfers. Pt fatigues easily with static standing (vs functional ambulation with RW).                 OT Short Term Goals - 07/31/17 1554      OT SHORT TERM GOAL #1   Title  Patient / caregiver I with HEP- 08/23/17    Status  On-going  OT SHORT TERM GOAL #2   Title  Pt will donn shirt with min A/ v.c    Status  On-going      OT SHORT TERM GOAL #3   Title  Pt will perfrom UB bathing with min A/ VC.     Status  On-going      OT SHORT TERM GOAL #4   Title  Pt will perform tabletop scanning tasks with 75% accuracy in prep for ADLs.    Status  On-going      OT SHORT TERM GOAL #5   Title  Pt will perform shower transfers consistently with min A.    Status  On-going        OT Long Term Goals - 07/31/17 1555      OT LONG TERM GOAL #1   Title  Pt will demonstrate improved LUE coordination  as evidenced by perfroming 9 hole peg test in 63 secs or less- due 09/21/17    Status  On-going      OT LONG TERM GOAL #2   Title  Pt demonstrate adequate standing balance to assist with pulling up pants with minguard assist for balance.    Status  On-going      OT LONG TERM GOAL #3   Title  Pt will donn pants with mod A    Status  On-going      OT LONG TERM GOAL #4   Title  Pt will  perfrom tabletop scanning with 85% accuracy of better for increased ease with ADLs.    Status  On-going      OT LONG TERM GOAL #5   Title  Pt will consistently use LUE as a non dominant assist for ADLs, and playing the piano    Status  On-going      OT LONG TERM GOAL #6   Title  Pt will demonstrate ability to retrieve a lightweight object at 110 shoulder flexion.    Status  On-going            Plan - 07/31/17 1555    Clinical Impression Statement  Pt with slow progress toward goals. Pt demonstrates improving balance during individual session and benefits from repetition and challenge.     Rehab Potential  Fair    Current Impairments/barriers affecting progress:  severity of deficits, L homonymous hemianopsia, cognitive deficits, advanced age    OT Frequency  2x / week    OT Duration  8 weeks    OT Treatment/Interventions  Self-care/ADL training;Moist Heat;Fluidtherapy;DME and/or AE instruction;Splinting;Patient/family education;Balance training;Therapeutic exercises;Ultrasound;Therapeutic exercise;Therapeutic activities;Cognitive remediation/compensation;Passive range of motion;Functional Mobility Training;Neuromuscular education;Electrical Stimulation;Cryotherapy;Parrafin;Energy conservation;Manual Therapy;Visual/perceptual remediation/compensation    Plan  initiate coordination HEP, balance, functional mobility, strategies for ADL's    Consulted and Agree with Plan of Care  Patient;Family member/caregiver    Family Member Consulted  wife, Normand Sloop,  and hired caregiver, Mickel Baas       Patient will benefit from skilled therapeutic intervention in order to improve the following deficits and impairments:  Abnormal gait, Decreased coordination, Decreased range of motion, Difficulty walking, Impaired flexibility, Decreased safety awareness, Decreased endurance, Decreased activity tolerance, Decreased knowledge of precautions, Impaired UE functional use, Decreased balance, Decreased knowledge of  use of DME, Impaired vision/preception, Impaired perceived functional ability, Decreased strength, Decreased mobility, Decreased cognition  Visit Diagnosis: Other lack of coordination  Frontal lobe and executive function deficit  Unsteadiness on feet  Muscle weakness (generalized)  Other abnormalities of gait and mobility    Problem List Patient Active  Problem List   Diagnosis Date Noted  . Cerebrovascular accident (CVA) ([place]) 04/15/2017  . Acute on chronic diastolic CHF (congestive heart failure) ([place])   . Leukocytosis 03/23/2017  . Essential hypertension 03/07/2017  . Weakness generalized 02/18/2017  . Chronic indwelling Foley catheter 11/22/2016  . Enlarged prostate 11/22/2016  . Urinary retention 10/31/2016  . Diabetes mellitus with complication ([place])   . Renal insufficiency 10/25/2016  . Hx: UTI (urinary tract infection) 10/20/2016  . Hypothyroidism 09/15/2016  . Gastroesophageal reflux disease without esophagitis 07/09/2015  . Falls 06/22/2015  . Diabetes mellitus type II, uncontrolled ([place]) 05/29/2015  . Paroxysmal atrial fibrillation ([place]) 05/29/2015  . HLD (hyperlipidemia) 05/29/2015  . Hx of completed stroke 05/29/2015    Quay Burow, OTR/L 07/31/2017, 3:58 PM  [facility] [address] [place], [place], [address] Phone: [phone]   Fax:  [phone]  Name: Jaquise Faux. MRN: [id number] Date of Birth: [birth year]

## 2017-07-31 NOTE — Therapy (Signed)
[facility] [address] [place], [place], [address] Phone: [phone]   Fax:  [phone]  Physical Therapy Treatment  Patient Details  Name: Gerald Holt. MRN: [id number] Date of Birth: 01-03-1924 Referring Provider: Metta Clines MD   Encounter Date: 07/31/2017  PT End of Session - 07/31/17 1910    Visit Number  5    Number of Visits  17    Date for PT Re-Evaluation  09/05/17    Authorization Type  Medicare; Gcode & progress note 10th visits    Authorization Time Period  07/07/17 to 09/05/17    PT Start Time  1316    PT Stop Time  1358    PT Time Calculation (min)  42 min    Equipment Utilized During Treatment  Gait belt    Activity Tolerance  Patient tolerated treatment well    Behavior During Therapy  [facility] for tasks assessed/performed       Past Medical History:  Diagnosis Date  . A-fib ([place])   . C. difficile diarrhea   . Cataracts, bilateral   . Chronic dermatitis   . Diabetes type 2, controlled ([place]) 2000  . History of chicken pox   . Mumps    Adult  . Retinopathy   . Shingles   . Stroke [facility]) Nov 2011  . Undescended testicle, unilateral    Right  . Whooping cough     Past Surgical History:  Procedure Laterality Date  . CARDIAC VALVE REPLACEMENT    . PRE-MALIGNANT / BENIGN SKIN LESION EXCISION    . TESTICLE SURGERY     Right, undescended  . TOOTH EXTRACTION      There were no vitals filed for this visit.  Subjective Assessment - 07/31/17 1316    Subjective  Wife has stopped his amiodorone and trazadone due to risks associated with the meds. Pt/wife are to see his MD tomorrow.    Patient is accompained by:  Family member Ellie, wife; Mickel Baas, caregiver    Pertinent History  Rt CVA x 2; hemianopsia, inattention    How long can you stand comfortably?  Pt "15 minutes"; Wife "maybe 3 minutes due to weakness"    Patient Stated Goals  I want to be able to walk unassisted.     Currently  in Pain?  --          Treatment consisted of multiple repetitions of sit to stand, stand to sit, ambulation with 90 & 180 degree turns with RW. Pt required up to mod assist during 2 turns and stepping back to align with his chair; mod assist x 3 reps of sit to stand and stand to sit to maintain anterior weight shift; and otherwise he was min assist throughout gait training.                     PT Education - 07/31/17 1903    Education provided  Yes    Education Details  for consistency with transfers, "Step 1" means he needs to scoot out to edge, "Step 2" is making sure his feet are set, and "Stand up!" "lean forward"; also using for turning to sit with the RW, using the front 2 legs of the chair he is aiming for as targets for how to get close enough before turning to sit       PT Short Term Goals - 07/28/17 1344      PT SHORT TERM GOAL #1  Title  Patient and caregiver will be independent in completing HEP. (Target for all goals 08/06/17)    Time  4    Period  Weeks    Status  New      PT SHORT TERM GOAL #2   Title  Assess gait velocity and set STG/LTG as appropriate.     Baseline  11/5  1.01 ft/sec    Time  4    Period  Weeks    Status  Achieved      PT SHORT TERM GOAL #3   Title  Patient will ambulate 50 ft on level indoor surface with LRAD and minimal assistance.     Time  4    Period  Weeks    Status  New      PT SHORT TERM GOAL #4   Title  Patient will decrease 5 x sit to stand test to <55 seconds (with use of armrests from wheelchair) to demonstrate improved LE strength and balance.    Time  4    Period  Weeks    Status  New      PT SHORT TERM GOAL #5   Title  Patient will improve gait velocity to 1.30 ft/sec to demonstrate progression toward ultimate goal of >1.8 ft/sec (target date also 08/06/17)    Time  2    Period  Weeks    Status  New        PT Long Term Goals - 07/07/17 2233      PT LONG TERM GOAL #1   Title  Patient and caregiver  will be independent in completing updated HEP. (Target for all goals 09/05/17)    Time  8    Period  Weeks    Status  New    Target Date  09/05/17      PT LONG TERM GOAL #2   Title  Patient will ambulate 75 ft with LRAD and minguard assist on level indoor surface.     Time  8    Period  Weeks    Status  New      PT LONG TERM GOAL #3   Title  Patient will decrease 5x sit to stand to <50 seconds (indicative of improved LE strength and balance)    Time  8    Period  Weeks    Status  New            Plan - 07/31/17 1911    Clinical Impression Statement  Session focused on sit to stand and stand to sit transfers with anterior weight-shift and proper sequencing. Also focused on gait training with RW and making safe turns. Patient able to follow all instructions, but does require mulitple reps before less vc can be given. Patient continues to benefit from PT and wife is very pleased.     Rehab Potential  Good    Clinical Impairments Affecting Rehab Potential  dementia (+support of wife and hired caregiver)    PT Frequency  2x / week    PT Duration  8 weeks    PT Treatment/Interventions  ADLs/Self Care Home Management;Functional mobility training;Stair training;Gait training;DME Instruction;Therapeutic activities;Therapeutic exercise;Balance training;Neuromuscular re-education;Cognitive remediation;Patient/family education;Orthotic Fit/Training;Wheelchair mobility training;Passive range of motion;Visual/perceptual remediation/compensation    PT Next Visit Plan  LE strengthening; standing balance; gait training with RW with multiple reps for carryover; practice 180 degree turns (he has to make 180 in his home in the area where he can practice walking    Consulted and Agree  with Plan of Care  Patient;Family member/caregiver    Family Member Consulted  wife       Patient will benefit from skilled therapeutic intervention in order to improve the following deficits and impairments:  Abnormal  gait, Decreased activity tolerance, Decreased balance, Decreased cognition, Decreased mobility, Decreased knowledge of use of DME, Decreased coordination, Decreased range of motion, Decreased safety awareness, Decreased strength, Difficulty walking, Postural dysfunction, Impaired vision/preception, Impaired UE functional use  Visit Diagnosis: Weakness generalized  Other abnormalities of gait and mobility  Unsteadiness on feet     Problem List Patient Active Problem List   Diagnosis Date Noted  . Cerebrovascular accident (CVA) ([place]) 04/15/2017  . Acute on chronic diastolic CHF (congestive heart failure) ([place])   . Leukocytosis 03/23/2017  . Essential hypertension 03/07/2017  . Weakness generalized 02/18/2017  . Chronic indwelling Foley catheter 11/22/2016  . Enlarged prostate 11/22/2016  . Urinary retention 10/31/2016  . Diabetes mellitus with complication ([place])   . Renal insufficiency 10/25/2016  . Hx: UTI (urinary tract infection) 10/20/2016  . Hypothyroidism 09/15/2016  . Gastroesophageal reflux disease without esophagitis 07/09/2015  . Falls 06/22/2015  . Diabetes mellitus type II, uncontrolled ([place]) 05/29/2015  . Paroxysmal atrial fibrillation ([place]) 05/29/2015  . HLD (hyperlipidemia) 05/29/2015  . Hx of completed stroke 05/29/2015    Rexanne Mano, PT 07/31/2017, 7:18 PM  [facility] [address] [place], [place], [address] Phone: [phone]   Fax:  [phone]  Name: Rylei Codispoti. MRN: [id number] Date of Birth: [birth year]

## 2017-08-01 ENCOUNTER — Other Ambulatory Visit: Payer: Self-pay

## 2017-08-01 ENCOUNTER — Encounter: Payer: Self-pay | Admitting: Physician Assistant

## 2017-08-01 ENCOUNTER — Ambulatory Visit (INDEPENDENT_AMBULATORY_CARE_PROVIDER_SITE_OTHER): Payer: Medicare Other | Admitting: Physician Assistant

## 2017-08-01 VITALS — BP 140/72 | HR 57 | Temp 97.5°F | Resp 14 | Ht 66.0 in | Wt 158.0 lb

## 2017-08-01 DIAGNOSIS — W19XXXD Unspecified fall, subsequent encounter: Secondary | ICD-10-CM | POA: Diagnosis not present

## 2017-08-01 DIAGNOSIS — I5033 Acute on chronic diastolic (congestive) heart failure: Secondary | ICD-10-CM

## 2017-08-01 DIAGNOSIS — Z8673 Personal history of transient ischemic attack (TIA), and cerebral infarction without residual deficits: Secondary | ICD-10-CM

## 2017-08-01 DIAGNOSIS — I480 Paroxysmal atrial fibrillation: Secondary | ICD-10-CM | POA: Diagnosis not present

## 2017-08-01 DIAGNOSIS — I639 Cerebral infarction, unspecified: Secondary | ICD-10-CM

## 2017-08-01 MED ORDER — NYSTATIN-TRIAMCINOLONE 100000-0.1 UNIT/GM-% EX OINT: 1.0000 "application " | TOPICAL_OINTMENT | Freq: Two times a day (BID) | CUTANEOUS | 0 refills | Status: DC

## 2017-08-01 NOTE — Progress Notes (Signed)
Patient presents to clinic today with wife (prinary caregiver) for ER follow-up. Patient was taken to ER on 07/27/17 after sustaining a fall at home. Patient fell when attempting to stand from seated position on his own. Slipped on floor and fell down off the bed. Noted hitting head but without LOC. Patient Rx'd Eliquis but had not been taking. Er workup included CT head that revealed 1. No acute intracranial hemorrhage, acute traumatic injury, or acute intracranial abnormality identified. 2. Expected evolution of Right PCA territory infarct in July. 3. Chronic sinusitis.  Once evaluated and no concerning acute findings, Er MD attempted to start ABX for chronic sinusitis but patient and caregiver refused. Patient was discharged home in the care of his family.   Since discharge, patient's wife endorses patient has been doing well overall. Has been a little more fatigues that usual. States that she has stopped his amiodarone due to leg swelling and rash. Since stopping medicine she has noticed resolution of these symptoms. Is still declining antibiotics for chronic sinusitis. Has not started Flonase. Patient without sinus symptoms or complaints. Patient eating, hydrating and resting well. Is about to start OP PT now that [facility] PT is completed. They are still waiting on wheelchair. Denies any lightheadedness, racing heart or syncopal episode.   Past Medical History:  Diagnosis Date  . A-fib ([place])   . C. difficile diarrhea   . Cataracts, bilateral   . Chronic dermatitis   . Diabetes type 2, controlled ([place]) 2000  . History of chicken pox   . Mumps    Adult  . Retinopathy   . Shingles   . Stroke [facility]) Nov 2011  . Undescended testicle, unilateral    Right  . Whooping cough     Current Outpatient Medications on File Prior to Visit  Medication Sig Dispense Refill  . Calcium Carbonate Antacid (ALKA-SELTZER ANTACID PO) Take 2 tablets by mouth daily as needed (for indegestion).     . Cranberry 200 MG  CAPS Take 2 capsules 2 (two) times daily by mouth.    . cyanocobalamin (,VITAMIN B-12,) 1000 MCG/ML injection Inject 1,000 mcg into the muscle every 30 (thirty) days.    Marland Kitchen diltiazem (CARDIZEM CD) 240 MG 24 hr capsule Take 1 capsule (240 mg total) by mouth daily. 90 capsule 1  . docusate sodium (COLACE) 100 MG capsule Take 200 mg by mouth daily as needed for mild constipation.     Marland Kitchen ELDERBERRY PO Take 1 tablet 2 (two) times daily by mouth.    Marland Kitchen glucose blood test strip One Touch Verio strips Use as instructed to check fasting sugars once daily .Dx:E11.9 100 each 12  . levothyroxine (SYNTHROID, LEVOTHROID) 50 MCG tablet Take 1 tablet (50 mcg total) by mouth daily. 90 tablet 1  . metFORMIN (GLUCOPHAGE-XR) 500 MG 24 hr tablet TAKE 2 TABLETS WITH BREAKFAST AND AT BEDTIME. Start 04/17/17. 360 tablet 1  . Miconazole Nitrate (TRIPLE PASTE AF) 2 % OINT Apply 1 application topically daily as needed (jock itch).     . Misc Natural Products (FOCUSED MIND PO) Take 1 tablet 2 (two) times daily by mouth.    . Omega-3 Fatty Acids (FISH OIL) 1000 MG CAPS Take 1 capsule 2 (two) times daily by mouth.    Marland Kitchen omeprazole (PRILOSEC) 20 MG capsule Take 20 mg by mouth daily.    . Probiotic Product (PROBIOTIC-10 PO) Take 3 capsules daily by mouth.    Marland Kitchen amiodarone (PACERONE) 200 MG tablet Take 1 tablet (200 mg  total) by mouth 2 (two) times daily. Discontinue after 04/23/17 doses. (Patient not taking: Reported on 08/01/2017)    . apixaban (ELIQUIS) 5 MG TABS tablet Take 1 tablet (5 mg total) by mouth 2 (two) times daily. (Patient not taking: Reported on 08/01/2017) 180 tablet 1  . furosemide (LASIX) 20 MG tablet Take 1 tablet (20 mg total) by mouth daily. (Patient not taking: Reported on 08/01/2017) 30 tablet 5   No current facility-administered medications on file prior to visit.     Allergies  Allergen Reactions  . Flomax [Tamsulosin Hcl] Other (See Comments)    Hypotension  . Shellfish Allergy Anaphylaxis  .  Aspirin-Dipyridamole Er Other (See Comments)    Headaches   . Other Other (See Comments)    Blood Thinner given in Rehab gave H/As - Not Coumadin/Headaches      Family History  Problem Relation Age of Onset  . Pneumonia Mother 59       Deceased  . GI Bleed Father 89       Deceased - Ulcers  . Diabetes Cousin   . Diabetes Brother   . Cancer Paternal Aunt     Social History   Socioeconomic History  . Marital status: Married    Spouse name: None  . Number of children: None  . Years of education: None  . Highest education level: None  Social Needs  . Financial resource strain: None  . Food insecurity - worry: None  . Food insecurity - inability: None  . Transportation needs - medical: None  . Transportation needs - non-medical: None  Occupational History  . None  Tobacco Use  . Smoking status: Former Research scientist (life sciences)  . Smokeless tobacco: Never Used  . Tobacco comment: Hasn't smoked since age 4  Substance and Sexual Activity  . Alcohol use: No    Alcohol/week: 0.0 oz  . Drug use: No  . Sexual activity: No  Other Topics Concern  . None  Social History Narrative   Lives with wife in a one story home.  Has 1 child.  Retired Armed forces technical officer.  Education: Oceanographer.     Review of Systems - See HPI.  All other ROS are negative.  BP 140/72   Pulse (!) 57   Temp (!) 97.5 F (36.4 C) (Oral)   Resp 14   Ht '5\' 6"'  (1.676 m)   Wt 158 lb (71.7 kg)   SpO2 97%   BMI 25.50 kg/m   Physical Exam  Constitutional: He is well-developed, well-nourished, and in no distress.  HENT:  Head: Normocephalic and atraumatic.  Right Ear: External ear normal.  Left Ear: External ear normal.  Nose: Nose normal.  Mouth/Throat: Oropharynx is clear and moist. No oropharyngeal exudate.  TM within normal limits bilaterally.  Eyes: Conjunctivae are normal.  Neck: Neck supple.  Cardiovascular: Normal rate, regular rhythm, normal heart sounds and intact distal pulses.  Pulmonary/Chest: Effort normal  and breath sounds normal. No respiratory distress. He has no wheezes. He has no rales. He exhibits no tenderness.  Lymphadenopathy:    He has no cervical adenopathy.  Neurological: He is alert.  Patient sitting comfortably in chair. Is oriented to person and place but not time. Neuro exam at patient's baseline. No acute deficits noted.  Skin: Skin is warm and dry. No rash noted.  Psychiatric: Affect normal.  Vitals reviewed.  Recent Results (from the past 2160 hour(s))  CBC w/Diff     Status: Abnormal   Collection Time: 06/27/17  3:59 PM  Result Value Ref Range   WBC 13.2 (H) 3.8 - 10.8 Thousand/uL   RBC 4.02 (L) 4.20 - 5.80 Million/uL   Hemoglobin 11.9 (L) 13.2 - 17.1 g/dL   HCT 34.4 (L) 38.5 - 50.0 %   MCV 85.6 80.0 - 100.0 fL   MCH 29.6 27.0 - 33.0 pg   MCHC 34.6 32.0 - 36.0 g/dL   RDW 14.7 11.0 - 15.0 %   Platelets 334 140 - 400 Thousand/uL   MPV 12.3 7.5 - 12.5 fL   Neutro Abs 9,016 (H) 1,500 - 7,800 cells/uL   Lymphs Abs 2,600 850 - 3,900 cells/uL   WBC mixed population 845 200 - 950 cells/uL   Eosinophils Absolute 620 (H) 15 - 500 cells/uL   Basophils Absolute 119 0 - 200 cells/uL   Neutrophils Relative % 68.3 %   Total Lymphocyte 19.7 %   Monocytes Relative 6.4 %   Eosinophils Relative 4.7 %   Basophils Relative 0.9 %  Comp Met (CMET)     Status: Abnormal   Collection Time: 06/27/17  3:59 PM  Result Value Ref Range   Glucose, Bld 181 (H) 65 - 99 mg/dL    Comment: .            Fasting reference interval . For someone without known diabetes, a glucose value >125 mg/dL indicates that they may have diabetes and this should be confirmed with a follow-up test. .    BUN 13 7 - 25 mg/dL   Creat 1.17 (H) 0.70 - 1.11 mg/dL    Comment: For patients >10 years of age, the reference limit for Creatinine is approximately 13% higher for people identified as African-American. .    BUN/Creatinine Ratio 11 6 - 22 (calc)   Sodium 130 (L) 135 - 146 mmol/L   Potassium 5.4 (H)  3.5 - 5.3 mmol/L   Chloride 94 (L) 98 - 110 mmol/L   CO2 22 20 - 32 mmol/L   Calcium 9.6 8.6 - 10.3 mg/dL   Total Protein 7.1 6.1 - 8.1 g/dL   Albumin 4.0 3.6 - 5.1 g/dL   Globulin 3.1 1.9 - 3.7 g/dL (calc)   AG Ratio 1.3 1.0 - 2.5 (calc)   Total Bilirubin 0.3 0.2 - 1.2 mg/dL   Alkaline phosphatase (APISO) 66 40 - 115 U/L   AST 12 10 - 35 U/L   ALT 13 9 - 46 U/L  Hepatic function panel     Status: None   Collection Time: 07/21/17  2:55 PM  Result Value Ref Range   Total Bilirubin 0.4 0.2 - 1.2 mg/dL   Bilirubin, Direct 0.1 0.0 - 0.3 mg/dL   Alkaline Phosphatase 57 39 - 117 U/L   AST 16 0 - 37 U/L   ALT 13 0 - 53 U/L   Total Protein 7.1 6.0 - 8.3 g/dL   Albumin 4.1 3.5 - 5.2 g/dL  C-reactive protein     Status: Abnormal   Collection Time: 07/21/17  2:55 PM  Result Value Ref Range   CRP 0.2 (L) 0.5 - 20.0 mg/dL    Assessment/Plan: 1. Accident due to mechanical fall without injury, subsequent encounter ER workup negative. No residual symptoms. Patient doing well. Has OP PT set up. Will check on status of wheelchair.  2. Paroxysmal atrial fibrillation (H[facility]) 3. Cerebrovascular accident (CVA), unspecified mechanism ([place]) 4. Acute on chronic diastolic CHF (congestive heart failure) ([facility]) In sinus rhythm today. Wife has stopped Amiodarone and Eliquis until 2nd opinion with new Cardiologist  on Monday. States they are aware of risks but do not want him on these medications due to side effects. Wants to speak with Cardiology regarding other options. Did have her agree to keep ASA on board. Strict ER precautions given.   Leeanne Rio, PA-C

## 2017-08-01 NOTE — Patient Instructions (Signed)
I am glad Gerald Holt is looking well today. Follow-up with Cardiology as scheduled on Monday. Take Eliquis as directed.  You need to discuss the amiodraone and other options with Cardiology as discussed.  Start the anti-dandruff shampoo on scalp and nose. Start an in-shower moisturizer.  Use the nystatin ointment to the yeast rash on the hip.  Take Flonase as directed.  Follow-up with me in 2 weeks for reassessment of the yeast rash

## 2017-08-01 NOTE — Progress Notes (Signed)
Pre visit review using our clinic review tool, if applicable. No additional management support is needed unless otherwise documented below in the visit note. 

## 2017-08-04 ENCOUNTER — Ambulatory Visit: Payer: Medicare Other | Admitting: Physical Therapy

## 2017-08-04 ENCOUNTER — Ambulatory Visit: Payer: Medicare Other | Admitting: Occupational Therapy

## 2017-08-04 ENCOUNTER — Encounter: Payer: Self-pay | Admitting: Physician Assistant

## 2017-08-04 ENCOUNTER — Encounter: Payer: Self-pay | Admitting: Physical Therapy

## 2017-08-04 ENCOUNTER — Encounter: Payer: Self-pay | Admitting: Occupational Therapy

## 2017-08-04 DIAGNOSIS — R531 Weakness: Secondary | ICD-10-CM

## 2017-08-04 DIAGNOSIS — R002 Palpitations: Secondary | ICD-10-CM | POA: Diagnosis not present

## 2017-08-04 DIAGNOSIS — R41844 Frontal lobe and executive function deficit: Secondary | ICD-10-CM | POA: Diagnosis not present

## 2017-08-04 DIAGNOSIS — R278 Other lack of coordination: Secondary | ICD-10-CM

## 2017-08-04 DIAGNOSIS — I4510 Unspecified right bundle-branch block: Secondary | ICD-10-CM | POA: Diagnosis not present

## 2017-08-04 DIAGNOSIS — I10 Essential (primary) hypertension: Secondary | ICD-10-CM | POA: Diagnosis not present

## 2017-08-04 DIAGNOSIS — I639 Cerebral infarction, unspecified: Secondary | ICD-10-CM | POA: Diagnosis not present

## 2017-08-04 DIAGNOSIS — Z79899 Other long term (current) drug therapy: Secondary | ICD-10-CM | POA: Diagnosis not present

## 2017-08-04 DIAGNOSIS — I480 Paroxysmal atrial fibrillation: Secondary | ICD-10-CM | POA: Diagnosis not present

## 2017-08-04 DIAGNOSIS — R2689 Other abnormalities of gait and mobility: Secondary | ICD-10-CM | POA: Diagnosis not present

## 2017-08-04 DIAGNOSIS — M6281 Muscle weakness (generalized): Secondary | ICD-10-CM

## 2017-08-04 DIAGNOSIS — W19XXXA Unspecified fall, initial encounter: Secondary | ICD-10-CM | POA: Diagnosis not present

## 2017-08-04 DIAGNOSIS — R2681 Unsteadiness on feet: Secondary | ICD-10-CM

## 2017-08-04 NOTE — Therapy (Signed)
[facility] [address] [place], [place], [address] Phone: [phone]   Fax:  [phone]  Occupational Therapy Treatment  Patient Details  Name: Gerald Holt. MRN: [id number] Date of Birth: 11/09/1923 Referring Provider: Dr. Tomi Likens   Encounter Date: 08/04/2017  OT End of Session - 08/04/17 1645    Visit Number  4    Number of Visits  17    Date for OT Re-Evaluation  09/23/17    Authorization Type  Medicare    Authorization - Visit Number  4    Authorization - Number of Visits  10    OT Start Time  5638    OT Stop Time  1446    OT Time Calculation (min)  43 min    Activity Tolerance  Patient tolerated treatment well    Behavior During Therapy  [facility] for tasks assessed/performed       Past Medical History:  Diagnosis Date  . A-fib ([place])   . C. difficile diarrhea   . Cataracts, bilateral   . Chronic dermatitis   . Diabetes type 2, controlled ([place]) 2000  . History of chicken pox   . Mumps    Adult  . Retinopathy   . Shingles   . Stroke [facility]) Nov 2011  . Undescended testicle, unilateral    Right  . Whooping cough     Past Surgical History:  Procedure Laterality Date  . CARDIAC VALVE REPLACEMENT    . PRE-MALIGNANT / BENIGN SKIN LESION EXCISION    . TESTICLE SURGERY     Right, undescended  . TOOTH EXTRACTION      There were no vitals filed for this visit.  Subjective Assessment - 08/04/17 1505    Subjective   I think I can stand a little longer    Patient is accompained by:  Family member    Pertinent History      PMH-dementia, DM, PAF, CVA, HOH Personal factors: age, pt's cognitive status, presence of left inattention/neglect, Recent CVA 04/15/17, hx of CVA 2011    Patient Stated Goals  regain functional use of LUE for playing piano    Currently in Pain?  No/denies                   OT Treatments/Exercises (OP) - 08/04/17 0001      Cognitive Exercises   Other  Cognitive Exercises 1  Discussed with wife the benefit of having patient follow a simple routine each day to improve compliance with particiaption in daily activities / exercises.  Encouraged wife to establish a schedule that followed a similar flow each day,a dn that this may help patient be able to anticipate next activity, and also defines a beginning and end time to tasks.  Hopefully this will aide with compliance.        Fine Motor Coordination   Other Fine Motor Exercises  Encourgaed patient to return to piano playing - simple scale exercises to encourage more fluid movement in left hand.  Wife indicates that patient has been hesitant to return to piano since his recent fall, but will attempt to have him play ~ 10 min at a time, avoid fatigue or frustration.        Neurological Re-education Exercises   Other Exercises 1  Worked to address sit to/from stand transitions, static and dynamic stand balance, and functional transfers.  Patient with improved ability to transition from sit to stand with facilitation of mass over base of  support.  Patient with tendency to not sufficiently weight shift forwarrd, and then preamturely attempt to stand - leading to posterior loss of balance.  Patient once standing, better able to power up left leg, and accept weight through both feet.  Patient has a tendency to drift toward weaker left side, and at times to push toward weaker left side.  Patient with delayed baalnce reactions in standing, although occasionally can correct his balance - usually after proprioceptive input.               OT Education - 08/04/17 1645    Education provided  Yes    Education Details  Piano scales for left UE coordianton    Person(s) Educated  Patient;Spouse;Caregiver(s)    Methods  Explanation    Comprehension  Verbalized understanding       OT Short Term Goals - 07/31/17 1554      OT SHORT TERM GOAL #1   Title  Patient / caregiver I with HEP- 08/23/17    Status   On-going      OT SHORT TERM GOAL #2   Title  Pt will donn shirt with min A/ v.c    Status  On-going      OT SHORT TERM GOAL #3   Title  Pt will perfrom UB bathing with min A/ VC.     Status  On-going      OT SHORT TERM GOAL #4   Title  Pt will perform tabletop scanning tasks with 75% accuracy in prep for ADLs.    Status  On-going      OT SHORT TERM GOAL #5   Title  Pt will perform shower transfers consistently with min A.    Status  On-going        OT Long Term Goals - 07/31/17 1555      OT LONG TERM GOAL #1   Title  Pt will demonstrate improved LUE coordination  as evidenced by perfroming 9 hole peg test in 63 secs or less- due 09/21/17    Status  On-going      OT LONG TERM GOAL #2   Title  Pt demonstrate adequate standing balance to assist with pulling up pants with minguard assist for balance.    Status  On-going      OT LONG TERM GOAL #3   Title  Pt will donn pants with mod A    Status  On-going      OT LONG TERM GOAL #4   Title  Pt will perfrom tabletop scanning with 85% accuracy of better for increased ease with ADLs.    Status  On-going      OT LONG TERM GOAL #5   Title  Pt will consistently use LUE as a non dominant assist for ADLs, and playing the piano    Status  On-going      OT LONG TERM GOAL #6   Title  Pt will demonstrate ability to retrieve a lightweight object at 110 shoulder flexion.    Status  On-going            Plan - 08/04/17 1646    Clinical Impression Statement  Patient is progressing toward OT goals considering age and medical history.  Patient has supportive wife and paid caregiver that attend therapy sessions.      Rehab Potential  Fair    Current Impairments/barriers affecting progress:  severity of deficits, L homonymous hemianopsia, cognitive deficits, advanced age    OT Frequency  2x / week    OT Duration  8 weeks    OT Treatment/Interventions  Self-care/ADL training;Moist Heat;Fluidtherapy;DME and/or AE  instruction;Splinting;Patient/family education;Balance training;Therapeutic exercises;Ultrasound;Therapeutic exercise;Therapeutic activities;Cognitive remediation/compensation;Passive range of motion;Functional Mobility Training;Neuromuscular education;Electrical Stimulation;Cryotherapy;Parrafin;Energy conservation;Manual Therapy;Visual/perceptual remediation/compensation    Plan  balance, functional mobility, strategies for ADL    Consulted and Agree with Plan of Care  Patient;Family member/caregiver    Family Member Consulted  wife, Normand Sloop,  and hired caregiver, Mickel Baas       Patient will benefit from skilled therapeutic intervention in order to improve the following deficits and impairments:  Abnormal gait, Decreased coordination, Decreased range of motion, Difficulty walking, Impaired flexibility, Decreased safety awareness, Decreased endurance, Decreased activity tolerance, Decreased knowledge of precautions, Impaired UE functional use, Decreased balance, Decreased knowledge of use of DME, Impaired vision/preception, Impaired perceived functional ability, Decreased strength, Decreased mobility, Decreased cognition  Visit Diagnosis: Other lack of coordination  Frontal lobe and executive function deficit  Unsteadiness on feet  Muscle weakness (generalized)  Weakness generalized    Problem List Patient Active Problem List   Diagnosis Date Noted  . Cerebrovascular accident (CVA) ([place]) 04/15/2017  . Acute on chronic diastolic CHF (congestive heart failure) ([place])   . Leukocytosis 03/23/2017  . Essential hypertension 03/07/2017  . Weakness generalized 02/18/2017  . Chronic indwelling Foley catheter 11/22/2016  . Enlarged prostate 11/22/2016  . Urinary retention 10/31/2016  . Diabetes mellitus with complication ([place])   . Renal insufficiency 10/25/2016  . Hx: UTI (urinary tract infection) 10/20/2016  . Hypothyroidism 09/15/2016  . Gastroesophageal reflux disease without esophagitis  07/09/2015  . Falls 06/22/2015  . Diabetes mellitus type II, uncontrolled ([place]) 05/29/2015  . Paroxysmal atrial fibrillation ([place]) 05/29/2015  . HLD (hyperlipidemia) 05/29/2015  . Hx of completed stroke 05/29/2015    Mariah Milling, OTR/L 08/04/2017, 4:48 PM  [facility] [address] [place], [place], [address] Phone: [phone]   Fax:  [phone]  Name: Gerald Holt. MRN: [id number] Date of Birth: 11/14/1923

## 2017-08-04 NOTE — Therapy (Addendum)
[facility] [address] [place], [place], [address] Phone: [phone]   Fax:  [phone]  Physical Therapy Treatment  Patient Details  Name: Gerald Holt. MRN: [id number] Date of Birth: Oct 17, 1923 Referring Provider: Metta Clines MD   Encounter Date: 08/04/2017  PT End of Session - 08/04/17 1933    Visit Number  6    Number of Visits  17    Date for PT Re-Evaluation  09/05/17    Authorization Type  Medicare; Gcode & progress note 10th visits    Authorization Time Period  07/07/17 to 09/05/17    PT Start Time  1316    PT Stop Time  1358    PT Time Calculation (min)  42 min    Equipment Utilized During Treatment  Gait belt    Activity Tolerance  Patient tolerated treatment well    Behavior During Therapy  [facility] for tasks assessed/performed       Past Medical History:  Diagnosis Date  . A-fib ([place])   . C. difficile diarrhea   . Cataracts, bilateral   . Chronic dermatitis   . Diabetes type 2, controlled ([place]) 2000  . History of chicken pox   . Mumps    Adult  . Retinopathy   . Shingles   . Stroke [facility]) Nov 2011  . Undescended testicle, unilateral    Right  . Whooping cough     Past Surgical History:  Procedure Laterality Date  . CARDIAC VALVE REPLACEMENT    . PRE-MALIGNANT / BENIGN SKIN LESION EXCISION    . TESTICLE SURGERY     Right, undescended  . TOOTH EXTRACTION      There were no vitals filed for this visit.  Subjective Assessment - 08/04/17 1320    Subjective  [name]'s energy is back! Did some walking at home. Tried 180 turns once and didn't go so well--he pushed walker too far ahead. Instead worked on 90 degree turns and did better.   (Pended)     Patient is accompained by:  Family member  ([place])  [place], wife; Mickel Baas, caregiver    Pertinent History  Rt CVA x 2; hemianopsia, inattention  (Pended)     How long can you stand comfortably?  Pt "15 minutes"; Wife "maybe 3 minutes due  to weakness"  (Pended)     Patient Stated Goals  I want to be able to walk unassisted.   (Pended)     Currently in Pain?  No/denies  (Pended)                   Treatment focused on sequencing sit to stand to RW,  Turning 90-180 degrees with RW, and walking with RW. By end of session, pt sequencing sit to stand with only questioning cues while turns with RW progressed to needing min assist.   Balance training- in // bars with walking forward with light support of rails, sideways, and short distance backwards. Tandem stance with bil UE support.               PT Short Term Goals - 07/28/17 1344      PT SHORT TERM GOAL #1   Title  Patient and caregiver will be independent in completing HEP. (Target for all goals 08/06/17)    Time  4    Period  Weeks    Status  New      PT SHORT TERM GOAL #2   Title  Assess gait velocity  and set STG/LTG as appropriate.     Baseline  11/5  1.01 ft/sec    Time  4    Period  Weeks    Status  Achieved      PT SHORT TERM GOAL #3   Title  Patient will ambulate 50 ft on level indoor surface with LRAD and minimal assistance.     Time  4    Period  Weeks    Status  New      PT SHORT TERM GOAL #4   Title  Patient will decrease 5 x sit to stand test to <55 seconds (with use of armrests from wheelchair) to demonstrate improved LE strength and balance.    Time  4    Period  Weeks    Status  New      PT SHORT TERM GOAL #5   Title  Patient will improve gait velocity to 1.30 ft/sec to demonstrate progression toward ultimate goal of >1.8 ft/sec (target date also 08/06/17)    Time  2    Period  Weeks    Status  New        PT Long Term Goals - 07/07/17 2233      PT LONG TERM GOAL #1   Title  Patient and caregiver will be independent in completing updated HEP. (Target for all goals 09/05/17)    Time  8    Period  Weeks    Status  New    Target Date  09/05/17      PT LONG TERM GOAL #2   Title  Patient will ambulate 75 ft with LRAD  and minguard assist on level indoor surface.     Time  8    Period  Weeks    Status  New      PT LONG TERM GOAL #3   Title  Patient will decrease 5x sit to stand to <50 seconds (indicative of improved LE strength and balance)    Time  8    Period  Weeks    Status  New            Plan - 08/04/17 1934    Clinical Impression Statement  Session focused on transfers to/from RW and wheelchair with mulitple repetitions for improved carryover. Again practiced 180 and 90 degree turns with pt staying close to the RW and taking smaller steps. Finally worked on Hotel manager. Patient making progress and continues to benefit from PT.    Rehab Potential  Good    Clinical Impairments Affecting Rehab Potential  dementia (+support of wife and hired caregiver)    PT Frequency  2x / week    PT Duration  8 weeks    PT Treatment/Interventions  ADLs/Self Care Home Management;Functional mobility training;Stair training;Gait training;DME Instruction;Therapeutic activities;Therapeutic exercise;Balance training;Neuromuscular re-education;Cognitive remediation;Patient/family education;Orthotic Fit/Training;Wheelchair mobility training;Passive range of motion;Visual/perceptual remediation/compensation    PT Next Visit Plan Check STGs; LE strengthening; standing balance; gait training with RW with multiple reps for carryover; practice 180 degree turns (he has to make 180 in his home in the area where he can practice walking    Consulted and Agree with Plan of Care  Patient;Family member/caregiver    Family Member Consulted  wife Normand Sloop), caregiver Mickel Baas)       Patient will benefit from skilled therapeutic intervention in order to improve the following deficits and impairments:  Abnormal gait, Decreased activity tolerance, Decreased balance, Decreased cognition, Decreased mobility, Decreased knowledge of use of DME, Decreased coordination, Decreased  range of motion, Decreased safety awareness, Decreased  strength, Difficulty walking, Postural dysfunction, Impaired vision/preception, Impaired UE functional use  Visit Diagnosis: Unsteadiness on feet  Muscle weakness (generalized)  Other abnormalities of gait and mobility     Problem List Patient Active Problem List   Diagnosis Date Noted  . Cerebrovascular accident (CVA) ([place]) 04/15/2017  . Acute on chronic diastolic CHF (congestive heart failure) ([place])   . Leukocytosis 03/23/2017  . Essential hypertension 03/07/2017  . Weakness generalized 02/18/2017  . Chronic indwelling Foley catheter 11/22/2016  . Enlarged prostate 11/22/2016  . Urinary retention 10/31/2016  . Diabetes mellitus with complication ([place])   . Renal insufficiency 10/25/2016  . Hx: UTI (urinary tract infection) 10/20/2016  . Hypothyroidism 09/15/2016  . Gastroesophageal reflux disease without esophagitis 07/09/2015  . Falls 06/22/2015  . Diabetes mellitus type II, uncontrolled ([place]) 05/29/2015  . Paroxysmal atrial fibrillation ([place]) 05/29/2015  . HLD (hyperlipidemia) 05/29/2015  . Hx of completed stroke 05/29/2015    Rexanne Mano, PT 08/04/2017, 7:37 PM  [facility] [address] [place], [place], [address] Phone: [phone]   Fax:  [phone]  Name: Gerald Lagrand. MRN: [id number] Date of Birth: 05-05-1924

## 2017-08-07 ENCOUNTER — Ambulatory Visit: Payer: Medicare Other | Admitting: Physical Therapy

## 2017-08-07 ENCOUNTER — Encounter: Payer: Medicare Other | Admitting: Occupational Therapy

## 2017-08-08 ENCOUNTER — Encounter: Payer: Self-pay | Admitting: Occupational Therapy

## 2017-08-08 ENCOUNTER — Ambulatory Visit: Payer: Medicare Other | Admitting: Occupational Therapy

## 2017-08-08 DIAGNOSIS — R41844 Frontal lobe and executive function deficit: Secondary | ICD-10-CM | POA: Diagnosis not present

## 2017-08-08 DIAGNOSIS — R531 Weakness: Secondary | ICD-10-CM | POA: Diagnosis not present

## 2017-08-08 DIAGNOSIS — R2689 Other abnormalities of gait and mobility: Secondary | ICD-10-CM | POA: Diagnosis not present

## 2017-08-08 DIAGNOSIS — R2681 Unsteadiness on feet: Secondary | ICD-10-CM | POA: Diagnosis not present

## 2017-08-08 DIAGNOSIS — M6281 Muscle weakness (generalized): Secondary | ICD-10-CM

## 2017-08-08 DIAGNOSIS — R278 Other lack of coordination: Secondary | ICD-10-CM

## 2017-08-08 NOTE — Therapy (Signed)
[facility] [address] [place], [place], [address] Phone: [phone]   Fax:  [phone]  Occupational Therapy Treatment  Patient Details  Name: Gerald Holt. MRN: [id number] Date of Birth: [birth year] Referring Provider: Dr. Tomi Likens   Encounter Date: 08/08/2017  OT End of Session - 08/08/17 1601    Visit Number  5    Number of Visits  17    Date for OT Re-Evaluation  09/23/17    Authorization Type  Medicare    Authorization - Visit Number  5    Authorization - Number of Visits  10    OT Start Time  1440    OT Stop Time  1540    OT Time Calculation (min)  60 min    Activity Tolerance  Patient tolerated treatment well    Behavior During Therapy  [facility] for tasks assessed/performed       Past Medical History:  Diagnosis Date  . A-fib ([place])   . C. difficile diarrhea   . Cataracts, bilateral   . Chronic dermatitis   . Diabetes type 2, controlled ([place]) 2000  . History of chicken pox   . Mumps    Adult  . Retinopathy   . Shingles   . Stroke [facility]) Nov 2011  . Undescended testicle, unilateral    Right  . Whooping cough     Past Surgical History:  Procedure Laterality Date  . CARDIAC VALVE REPLACEMENT    . PRE-MALIGNANT / BENIGN SKIN LESION EXCISION    . TESTICLE SURGERY     Right, undescended  . TOOTH EXTRACTION      There were no vitals filed for this visit.  Subjective Assessment - 08/08/17 1553    Subjective   I had a break through with my left hand when I was playing the piano    Patient is accompained by:  Family member    Pertinent History      PMH-dementia, DM, PAF, CVA, HOH Personal factors: age, pt's cognitive status, presence of left inattention/neglect, Recent CVA 04/15/17, hx of CVA 2011    Patient Stated Goals  regain functional use of LUE for playing piano    Currently in Pain?  No/denies    Multiple Pain Sites  No                   OT Treatments/Exercises (OP)  - 08/08/17 0001      ADLs   UB Dressing  Patient able to unbutton overshirt without assistance.  Patient unable to orient shirt to don without assistance.  Patient consistently attempted to don left arm in right sleeve.  Patient did have a pattern of dressing left arm first.  Patient had difficulty locating buttons on shirt, but once oriented to placement, able to button with increased time.      LB Dressing  Patient able to don/ doff socks and shoes with min assist.  Patient needs additional practice with dynamic sitting balance to avoid tipping over and falling to right with legs crossed left over right.      Functional Mobility  Worked on functional mobility with emphasis on safe transition from sit to stand and stand to sit.  Worked on sortin cards to encourage functional use of left UE, reduce reliance on UE's in standing, improve baalnce, and visual scanning  (overcorrection) with looking toward left side.  Patient standing 3 minutes at a time without difficulty.  Patient needed frequent cues and facilitation to  stand tall, with hip, thoracic extension.               OT Education - 08/08/17 1558    Education provided  Yes    Education Details  Discussed that patient is ready for standing tasks at home with close supervision to min assist.  Patient used to help with drying the dishes, this may be appropriate to try.      Person(s) Educated  Patient;Spouse;Caregiver(s)    Methods  Explanation;Demonstration    Comprehension  Verbalized understanding       OT Short Term Goals - 08/08/17 1603      OT SHORT TERM GOAL #1   Title  Patient / caregiver I with HEP- 08/23/17    Status  Achieved      OT SHORT TERM GOAL #2   Title  Pt will donn shirt with min A/ v.c    Status  On-going      OT SHORT TERM GOAL #3   Title  Pt will perfrom UB bathing with min A/ VC.     Status  On-going      OT SHORT TERM GOAL #4   Title  Pt will perform tabletop scanning tasks with 75% accuracy in prep  for ADLs.    Status  On-going      OT SHORT TERM GOAL #5   Title  Pt will perform shower transfers consistently with min A.    Status  On-going        OT Long Term Goals - 07/31/17 1555      OT LONG TERM GOAL #1   Title  Pt will demonstrate improved LUE coordination  as evidenced by perfroming 9 hole peg test in 63 secs or less- due 09/21/17    Status  On-going      OT LONG TERM GOAL #2   Title  Pt demonstrate adequate standing balance to assist with pulling up pants with minguard assist for balance.    Status  On-going      OT LONG TERM GOAL #3   Title  Pt will donn pants with mod A    Status  On-going      OT LONG TERM GOAL #4   Title  Pt will perfrom tabletop scanning with 85% accuracy of better for increased ease with ADLs.    Status  On-going      OT LONG TERM GOAL #5   Title  Pt will consistently use LUE as a non dominant assist for ADLs, and playing the piano    Status  On-going      OT LONG TERM GOAL #6   Title  Pt will demonstrate ability to retrieve a lightweight object at 110 shoulder flexion.    Status  On-going            Plan - 08/08/17 1601    Clinical Impression Statement  Patient is showing steady progress toward OT goals due to improved functional use of Left UE/LE, and improving balance.      Rehab Potential  Fair    Current Impairments/barriers affecting progress:  severity of deficits, L homonymous hemianopsia, cognitive deficits, advanced age    OT Frequency  2x / week    OT Duration  8 weeks    OT Treatment/Interventions  Self-care/ADL training;Moist Heat;Fluidtherapy;DME and/or AE instruction;Splinting;Patient/family education;Balance training;Therapeutic exercises;Ultrasound;Therapeutic exercise;Therapeutic activities;Cognitive remediation/compensation;Passive range of motion;Functional Mobility Training;Neuromuscular education;Electrical Stimulation;Cryotherapy;Parrafin;Energy conservation;Manual Therapy;Visual/perceptual  remediation/compensation    Plan  balance, functional mobility, visual compensation for  hemianopsia - with left hand coordination tasks    Consulted and Agree with Plan of Care  Patient;Family member/caregiver    Family Member Consulted  wife, Normand Sloop,  and hired caregiver, Mickel Baas       Patient will benefit from skilled therapeutic intervention in order to improve the following deficits and impairments:  Abnormal gait, Decreased coordination, Decreased range of motion, Difficulty walking, Impaired flexibility, Decreased safety awareness, Decreased endurance, Decreased activity tolerance, Decreased knowledge of precautions, Impaired UE functional use, Decreased balance, Decreased knowledge of use of DME, Impaired vision/preception, Impaired perceived functional ability, Decreased strength, Decreased mobility, Decreased cognition  Visit Diagnosis: Other lack of coordination  Frontal lobe and executive function deficit  Unsteadiness on feet  Muscle weakness (generalized)    Problem List Patient Active Problem List   Diagnosis Date Noted  . Cerebrovascular accident (CVA) ([place]) 04/15/2017  . Acute on chronic diastolic CHF (congestive heart failure) ([place])   . Leukocytosis 03/23/2017  . Essential hypertension 03/07/2017  . Weakness generalized 02/18/2017  . Chronic indwelling Foley catheter 11/22/2016  . Enlarged prostate 11/22/2016  . Urinary retention 10/31/2016  . Diabetes mellitus with complication ([place])   . Renal insufficiency 10/25/2016  . Hx: UTI (urinary tract infection) 10/20/2016  . Hypothyroidism 09/15/2016  . Gastroesophageal reflux disease without esophagitis 07/09/2015  . Falls 06/22/2015  . Diabetes mellitus type II, uncontrolled ([place]) 05/29/2015  . Paroxysmal atrial fibrillation ([place]) 05/29/2015  . HLD (hyperlipidemia) 05/29/2015  . Hx of completed stroke 05/29/2015    Mariah Milling, OTR/L 08/08/2017, 4:03 PM  [facility] [address] [place], [place], [address] Phone: [phone]   Fax:  [phone]  Name: Gerald Holt. MRN: [id number] Date of Birth: Jul 26, 1924

## 2017-08-11 ENCOUNTER — Encounter: Payer: Self-pay | Admitting: Physical Therapy

## 2017-08-11 ENCOUNTER — Encounter: Payer: Self-pay | Admitting: Occupational Therapy

## 2017-08-11 ENCOUNTER — Ambulatory Visit: Payer: Medicare Other | Admitting: Occupational Therapy

## 2017-08-11 ENCOUNTER — Ambulatory Visit: Payer: Medicare Other | Admitting: Physical Therapy

## 2017-08-11 DIAGNOSIS — M6281 Muscle weakness (generalized): Secondary | ICD-10-CM

## 2017-08-11 DIAGNOSIS — R2681 Unsteadiness on feet: Secondary | ICD-10-CM | POA: Diagnosis not present

## 2017-08-11 DIAGNOSIS — R2689 Other abnormalities of gait and mobility: Secondary | ICD-10-CM

## 2017-08-11 DIAGNOSIS — R278 Other lack of coordination: Secondary | ICD-10-CM

## 2017-08-11 DIAGNOSIS — R531 Weakness: Secondary | ICD-10-CM | POA: Diagnosis not present

## 2017-08-11 DIAGNOSIS — R41844 Frontal lobe and executive function deficit: Secondary | ICD-10-CM

## 2017-08-11 NOTE — Therapy (Signed)
[facility] [address] [place], [place], [address] Phone: [phone]   Fax:  [phone]  Physical Therapy Treatment  Patient Details  Name: Gerald Holt. MRN: [id number] Date of Birth: February 21, 1924 Referring Provider: Metta Clines MD   Encounter Date: 08/11/2017  PT End of Session - 08/11/17 1907    Visit Number  7    Number of Visits  17    Date for PT Re-Evaluation  09/05/17    Authorization Type  Medicare; Gcode & progress note 10th visits    Authorization Time Period  07/07/17 to 09/05/17    PT Start Time  1334 pt arrived late    PT Stop Time  1406    PT Time Calculation (min)  32 min    Equipment Utilized During Treatment  Gait belt    Activity Tolerance  Patient tolerated treatment well    Behavior During Therapy  [facility] for tasks assessed/performed       Past Medical History:  Diagnosis Date  . A-fib ([place])   . C. difficile diarrhea   . Cataracts, bilateral   . Chronic dermatitis   . Diabetes type 2, controlled ([place]) 2000  . History of chicken pox   . Mumps    Adult  . Retinopathy   . Shingles   . Stroke [facility]) Nov 2011  . Undescended testicle, unilateral    Right  . Whooping cough     Past Surgical History:  Procedure Laterality Date  . CARDIAC VALVE REPLACEMENT    . PRE-MALIGNANT / BENIGN SKIN LESION EXCISION    . TESTICLE SURGERY     Right, undescended  . TOOTH EXTRACTION      There were no vitals filed for this visit.  Subjective Assessment - 08/11/17 1337    Subjective  Wife requesting copy of PT evaluation (referred to front office for paperwork).     Patient is accompained by:  Family member Ellie, wife; Mickel Baas, caregiver    Pertinent History  Rt CVA x 2; hemianopsia, inattention    How long can you stand comfortably?  Pt "15 minutes"; Wife "maybe 3 minutes due to weakness"    Patient Stated Goals  I want to be able to walk unassisted.     Currently in Pain?  No/denies                       [facility] Adult PT Treatment/Exercise - 08/11/17 0001      Transfers   Transfers  Sit to Stand;Stand to Sit    Sit to Stand  4: Min guard    Sit to Stand Details (indicate cue type and reason)  vc for scoot to front edge of seat ~90% of trials    Stand to Sit  3: Mod assist    Stand to Sit Details  to fully complete his turn and stepping backwards toward surface; multiple LOB ith up to mod assist to recover    Transfer Cueing  especially has difficulty if chair is on his left/in left field of vision      Ambulation/Gait   Ambulation/Gait Assistance  4: Min assist    Ambulation/Gait Assistance Details  for maintainging path with RW    Ambulation Distance (Feet)  50 Feet    Assistive device  Rolling walker    Gait Pattern  Step-through pattern;Decreased weight shift to left;Left foot flat;Trunk flexed;Narrow base of support;Decreased step length - right;Decreased step length - left  Gait velocity  10 ft/ 6.72 sec=1.49 ft/sec          Balance Exercises - 08/11/17 1904      Balance Exercises: Standing   Retro Gait  3 reps;Upper extremity support one hand on counter, one HHA by PT        PT Education - 08/11/17 1906    Education provided  Yes    Education Details  repeated education re: safety with approaching surfaces to sit down upon (get close before turning, align himself and feel legs against chair, reach back with LUE first)    Person(s) Educated  Patient;Spouse;Caregiver(s)    Methods  Explanation;Demonstration    Comprehension  Verbalized understanding;Need further instruction       PT Short Term Goals - 08/11/17 1910      PT SHORT TERM GOAL #1   Title  Patient and caregiver will be independent in completing HEP. (Target for all goals 08/06/17)    Time  4    Period  Weeks    Status  Achieved      PT SHORT TERM GOAL #2   Title  Assess gait velocity and set STG/LTG as appropriate.     Baseline  11/5  1.01 ft/sec 08/11/17 1.49  ft/sec    Time  4    Period  Weeks    Status  Achieved      PT SHORT TERM GOAL #3   Title  Patient will ambulate 50 ft on level indoor surface with LRAD and minimal assistance.     Time  4    Period  Weeks    Status  Achieved      PT SHORT TERM GOAL #4   Title  Patient will decrease 5 x sit to stand test to <55 seconds (with use of armrests from wheelchair) to demonstrate improved LE strength and balance.    Baseline  08/11/17 38 sec    Time  4    Period  Weeks    Status  Achieved      PT SHORT TERM GOAL #5   Title  Patient will improve gait velocity to 1.30 ft/sec to demonstrate progression toward ultimate goal of >1.8 ft/sec (target date also 08/06/17)    Baseline  08/11/17  1.49 ft/sec    Time  2    Period  Weeks    Status  Achieved        PT Long Term Goals - 08/11/17 1918      PT LONG TERM GOAL #1   Title  Patient and caregiver will be independent in completing updated HEP. (Target for all goals 09/05/17)    Time  8    Period  Weeks    Status  New      PT LONG TERM GOAL #2   Title  Patient will ambulate 75 ft with LRAD and minguard assist on level indoor surface.     Time  8    Period  Weeks    Status  New      PT LONG TERM GOAL #3   Title  Patient will decrease 5x sit to stand to <50 seconds (indicative of improved LE strength and balance); 08/11/17 target changed to <30 seconds    Baseline  08/11/17 38 sec    Time  8    Period  Weeks    Status  New            Plan - 08/11/17 1909    Clinical Impression Statement  Session included checking STGs with pt meeting 5 of 5 goals. Patient doing much better with walking in a straight path, however if requires turning or stepping backwards, pt can require up to moderate assistance to maintain his balance. Patient can continue to benefit from skilled PT.     Rehab Potential  Good    Clinical Impairments Affecting Rehab Potential  dementia (+support of wife and hired caregiver)    PT Frequency  2x / week    PT  Duration  8 weeks    PT Treatment/Interventions  ADLs/Self Care Home Management;Functional mobility training;Stair training;Gait training;DME Instruction;Therapeutic activities;Therapeutic exercise;Balance training;Neuromuscular re-education;Cognitive remediation;Patient/family education;Orthotic Fit/Training;Wheelchair mobility training;Passive range of motion;Visual/perceptual remediation/compensation    PT Next Visit Plan  LE strengthening; standing balance; gait training with RW with turns; practice approaching a chair to sit down    Consulted and Agree with Plan of Care  Patient;Family member/caregiver    Family Member Consulted  wife Normand Sloop), caregiver Mickel Baas)       Patient will benefit from skilled therapeutic intervention in order to improve the following deficits and impairments:  Abnormal gait, Decreased activity tolerance, Decreased balance, Decreased cognition, Decreased mobility, Decreased knowledge of use of DME, Decreased coordination, Decreased range of motion, Decreased safety awareness, Decreased strength, Difficulty walking, Postural dysfunction, Impaired vision/preception, Impaired UE functional use  Visit Diagnosis: Unsteadiness on feet  Muscle weakness (generalized)  Other abnormalities of gait and mobility     Problem List Patient Active Problem List   Diagnosis Date Noted  . Cerebrovascular accident (CVA) ([place]) 04/15/2017  . Acute on chronic diastolic CHF (congestive heart failure) ([place])   . Leukocytosis 03/23/2017  . Essential hypertension 03/07/2017  . Weakness generalized 02/18/2017  . Chronic indwelling Foley catheter 11/22/2016  . Enlarged prostate 11/22/2016  . Urinary retention 10/31/2016  . Diabetes mellitus with complication ([place])   . Renal insufficiency 10/25/2016  . Hx: UTI (urinary tract infection) 10/20/2016  . Hypothyroidism 09/15/2016  . Gastroesophageal reflux disease without esophagitis 07/09/2015  . Falls 06/22/2015  . Diabetes mellitus  type II, uncontrolled ([place]) 05/29/2015  . Paroxysmal atrial fibrillation ([place]) 05/29/2015  . HLD (hyperlipidemia) 05/29/2015  . Hx of completed stroke 05/29/2015    Rexanne Mano, PT 08/11/2017, 7:19 PM  [facility] [address] [place], [place], [address] Phone: [phone]   Fax:  [phone]  Name: Gerald Holt. MRN: [id number] Date of Birth: 1924/02/26

## 2017-08-11 NOTE — Therapy (Signed)
[facility] [address] [place], [place], [address] Phone: [phone]   Fax:  [phone]  Occupational Therapy Treatment  Patient Details  Name: Gerald Holt. MRN: [id number] Date of Birth: Jan 12, 1924 Referring Provider: Dr. Tomi Likens   Encounter Date: 08/11/2017  OT End of Session - 08/11/17 1644    Visit Number  6    Number of Visits  17    Date for OT Re-Evaluation  09/23/17    Authorization Type  Medicare    Authorization - Visit Number  6    Authorization - Number of Visits  10    OT Start Time  9417    OT Stop Time  1444    OT Time Calculation (min)  42 min    Activity Tolerance  Patient tolerated treatment well       Past Medical History:  Diagnosis Date  . A-fib ([place])   . C. difficile diarrhea   . Cataracts, bilateral   . Chronic dermatitis   . Diabetes type 2, controlled ([place]) 2000  . History of chicken pox   . Mumps    Adult  . Retinopathy   . Shingles   . Stroke [facility]) Nov 2011  . Undescended testicle, unilateral    Right  . Whooping cough     Past Surgical History:  Procedure Laterality Date  . CARDIAC VALVE REPLACEMENT    . PRE-MALIGNANT / BENIGN SKIN LESION EXCISION    . TESTICLE SURGERY     Right, undescended  . TOOTH EXTRACTION      There were no vitals filed for this visit.  Subjective Assessment - 08/11/17 1411    Subjective   I am not sure but I think they help me dress    Patient is accompained by:  Family member wife and caregiver    Pertinent History      PMH-dementia, DM, PAF, CVA, HOH Personal factors: age, pt's cognitive status, presence of left inattention/neglect, Recent CVA 04/15/17, hx of CVA 2011    Patient Stated Goals  regain functional use of LUE for playing piano    Currently in Pain?  No/denies I had some pain in my back when I got here but its gone now                    OT Treatments/Exercises (OP) - 08/11/17 0001      ADLs   UB  Dressing  Wife reports that pt is now consistently buttoning and unbuttoning shirt at home.  Instructed pt/wife/caregiver in backward chaining to assist pt in orienting to shirt perceptually and then pt was able to don and doff button down shirt with only intermttent min a .        Neurological Re-education Exercises   Other Exercises 1  Neuro re ed to address sit to stand, static and dynamic standing balance first with one UE support and then without UE support in functional reaching activity that required forward weight shift to complete.  Also addressed dynamic weight shifting using BUE's to lift ball to 90* and mod cues for thoracic extension and active LE's.  Addressed transfers and ensuring that pt positions close to surface prior to turning around.  Also addressed stepping forward and stepping backward using walker and controlled stand to sit.                OT Short Term Goals - 08/11/17 1642      OT SHORT TERM  GOAL #1   Title  Patient / caregiver I with HEP- 08/23/17    Status  Achieved      OT SHORT TERM GOAL #2   Title  Pt will donn shirt with min A/ v.c    Status  On-going      OT SHORT TERM GOAL #3   Title  Pt will perfrom UB bathing with min A/ VC.     Status  On-going      OT SHORT TERM GOAL #4   Title  Pt will perform tabletop scanning tasks with 75% accuracy in prep for ADLs.    Status  On-going      OT SHORT TERM GOAL #5   Title  Pt will perform shower transfers consistently with min A.    Status  On-going        OT Long Term Goals - 08/11/17 1642      OT LONG TERM GOAL #1   Title  Pt will demonstrate improved LUE coordination  as evidenced by perfroming 9 hole peg test in 63 secs or less- due 09/21/17    Status  On-going      OT LONG TERM GOAL #2   Title  Pt demonstrate adequate standing balance to assist with pulling up pants with minguard assist for balance.    Status  On-going      OT LONG TERM GOAL #3   Title  Pt will donn pants with mod A     Status  On-going      OT LONG TERM GOAL #4   Title  Pt will perfrom tabletop scanning with 85% accuracy of better for increased ease with ADLs.    Status  On-going      OT LONG TERM GOAL #5   Title  Pt will consistently use LUE as a non dominant assist for ADLs, and playing the piano    Status  On-going      OT LONG TERM GOAL #6   Title  Pt will demonstrate ability to retrieve a lightweight object at 110 shoulder flexion.    Status  On-going            Plan - 08/11/17 1642    Clinical Impression Statement  Pt with progress toward goals. Wife and caregiver both report a reduced burden of care and that pt is more active at home.     Rehab Potential  Fair    Current Impairments/barriers affecting progress:  severity of deficits, L homonymous hemianopsia, cognitive deficits, advanced age    OT Frequency  2x / week    OT Duration  8 weeks    OT Treatment/Interventions  Self-care/ADL training;Moist Heat;Fluidtherapy;DME and/or AE instruction;Splinting;Patient/family education;Balance training;Therapeutic exercises;Ultrasound;Therapeutic exercise;Therapeutic activities;Cognitive remediation/compensation;Passive range of motion;Functional Mobility Training;Neuromuscular education;Electrical Stimulation;Cryotherapy;Parrafin;Energy conservation;Manual Therapy;Visual/perceptual remediation/compensation    Plan  ADL's, balance, functional mobility, visual compensation for hemianopsia with left hand coordination tasks.     Consulted and Agree with Plan of Care  Patient;Family member/caregiver    Family Member Consulted  wife, Normand Sloop,  and hired caregiver, Mickel Baas       Patient will benefit from skilled therapeutic intervention in order to improve the following deficits and impairments:  Abnormal gait, Decreased coordination, Decreased range of motion, Difficulty walking, Impaired flexibility, Decreased safety awareness, Decreased endurance, Decreased activity tolerance, Decreased knowledge of  precautions, Impaired UE functional use, Decreased balance, Decreased knowledge of use of DME, Impaired vision/preception, Impaired perceived functional ability, Decreased strength, Decreased mobility, Decreased cognition  Visit Diagnosis:  Other lack of coordination  Frontal lobe and executive function deficit  Unsteadiness on feet  Muscle weakness (generalized)  Weakness generalized  Other abnormalities of gait and mobility    Problem List Patient Active Problem List   Diagnosis Date Noted  . Cerebrovascular accident (CVA) ([place]) 04/15/2017  . Acute on chronic diastolic CHF (congestive heart failure) ([place])   . Leukocytosis 03/23/2017  . Essential hypertension 03/07/2017  . Weakness generalized 02/18/2017  . Chronic indwelling Foley catheter 11/22/2016  . Enlarged prostate 11/22/2016  . Urinary retention 10/31/2016  . Diabetes mellitus with complication ([place])   . Renal insufficiency 10/25/2016  . Hx: UTI (urinary tract infection) 10/20/2016  . Hypothyroidism 09/15/2016  . Gastroesophageal reflux disease without esophagitis 07/09/2015  . Falls 06/22/2015  . Diabetes mellitus type II, uncontrolled ([place]) 05/29/2015  . Paroxysmal atrial fibrillation ([place]) 05/29/2015  . HLD (hyperlipidemia) 05/29/2015  . Hx of completed stroke 05/29/2015    Quay Burow, OTR/L 08/11/2017, 4:46 PM  [facility] [address] [place], [place], [address] Phone: [phone]   Fax:  [phone]  Name: Gerald Holt. MRN: [id number] Date of Birth: 07-06-1924

## 2017-08-11 NOTE — Patient Instructions (Signed)
Ankle Dorsiflexion, Self-Mobilization, Sitting    Sit with feet flat. Slide one foot back until heel begins to lift off the floor. Put hand on your knee and press down to feel the stretch in your calf. Hold _30__ seconds. Repeat __3_ times per session. Do __2-3_ sessions per day.  Copyright  VHI. All rights reserved.

## 2017-08-13 ENCOUNTER — Ambulatory Visit: Payer: Medicare Other | Admitting: Physical Therapy

## 2017-08-18 ENCOUNTER — Ambulatory Visit: Payer: Medicare Other | Admitting: Physical Therapy

## 2017-08-18 ENCOUNTER — Encounter: Payer: Self-pay | Admitting: Occupational Therapy

## 2017-08-18 ENCOUNTER — Encounter: Payer: Self-pay | Admitting: Physical Therapy

## 2017-08-18 ENCOUNTER — Ambulatory Visit: Payer: Medicare Other | Admitting: Occupational Therapy

## 2017-08-18 DIAGNOSIS — R2689 Other abnormalities of gait and mobility: Secondary | ICD-10-CM | POA: Diagnosis not present

## 2017-08-18 DIAGNOSIS — R278 Other lack of coordination: Secondary | ICD-10-CM | POA: Diagnosis not present

## 2017-08-18 DIAGNOSIS — R2681 Unsteadiness on feet: Secondary | ICD-10-CM

## 2017-08-18 DIAGNOSIS — R41844 Frontal lobe and executive function deficit: Secondary | ICD-10-CM

## 2017-08-18 DIAGNOSIS — M6281 Muscle weakness (generalized): Secondary | ICD-10-CM

## 2017-08-18 DIAGNOSIS — R531 Weakness: Secondary | ICD-10-CM | POA: Diagnosis not present

## 2017-08-18 NOTE — Therapy (Signed)
[facility] [address] [place], [place], [address] Phone: [phone]   Fax:  [phone]  Physical Therapy Treatment  Patient Details  Name: Gerald Holt. MRN: [id number] Date of Birth: 1924/07/16 Referring Provider: Metta Clines MD   Encounter Date: 08/18/2017  PT End of Session - 08/18/17 1612    Visit Number  8    Number of Visits  17    Date for PT Re-Evaluation  09/05/17    Authorization Type  Medicare; Gcode & progress note 10th visits    Authorization Time Period  07/07/17 to 09/05/17    PT Start Time  1317    PT Stop Time  1400    PT Time Calculation (min)  43 min       Past Medical History:  Diagnosis Date  . A-fib ([place])   . C. difficile diarrhea   . Cataracts, bilateral   . Chronic dermatitis   . Diabetes type 2, controlled ([place]) 2000  . History of chicken pox   . Mumps    Adult  . Retinopathy   . Shingles   . Stroke [facility]) Nov 2011  . Undescended testicle, unilateral    Right  . Whooping cough     Past Surgical History:  Procedure Laterality Date  . CARDIAC VALVE REPLACEMENT    . PRE-MALIGNANT / BENIGN SKIN LESION EXCISION    . TESTICLE SURGERY     Right, undescended  . TOOTH EXTRACTION      There were no vitals filed for this visit.  Subjective Assessment - 08/18/17 1319    Subjective  Pt reports he hasn't done any exercises since PT session last week - says "I have a balance problem"    Patient is accompained by:  -- caregiver    Pertinent History  Rt CVA x 2; hemianopsia, inattention    Patient Stated Goals  I want to be able to walk unassisted.     Currently in Pain?  No/denies                      [facility] Adult PT Treatment/Exercise - 08/18/17 1348      Transfers   Transfers  Sit to Stand;Stand to Sit    Sit to Stand  4: Min guard    Sit to Stand Details (indicate cue type and reason)  verbal cues to keep feet separated    Stand to Sit  3: Mod  assist    Stand to Sit Details  pt has tendency to amb. beyond the chair and needs cues for turning; needs min to mod assist with backing up to chair    Transfer Cueing  Practiced amb. to different chairs placed in different places, requiring pt to turn left and right with use of RW      Ambulation/Gait   Ambulation/Gait Assistance  4: Min assist    Ambulation/Gait Assistance Details  needs cues to keep left foot abducted    Ambulation Distance (Feet)  230 Feet    Assistive device  Rolling walker    Gait Pattern  Step-through pattern;Decreased weight shift to left;Left foot flat;Trunk flexed;Narrow base of support;Decreased step length - right;Decreased step length - left LLE adducted in gait          Balance Exercises - 08/18/17 1610      Balance Exercises: Standing   Other Standing Exercises  Pt performed standing balance exercises including forward, backward and sidestepping 10 reps each:  marching in place x 10 reps each      Pt performed alternate tap ups to // bar floor surface (pt standing outside of // bars) - 10 reps each leg with bil. UE support    PT Short Term Goals - 08/11/17 1910      PT SHORT TERM GOAL #1   Title  Patient and caregiver will be independent in completing HEP. (Target for all goals 08/06/17)    Time  4    Period  Weeks    Status  Achieved      PT SHORT TERM GOAL #2   Title  Assess gait velocity and set STG/LTG as appropriate.     Baseline  11/5  1.01 ft/sec 08/11/17 1.49 ft/sec    Time  4    Period  Weeks    Status  Achieved      PT SHORT TERM GOAL #3   Title  Patient will ambulate 50 ft on level indoor surface with LRAD and minimal assistance.     Time  4    Period  Weeks    Status  Achieved      PT SHORT TERM GOAL #4   Title  Patient will decrease 5 x sit to stand test to <55 seconds (with use of armrests from wheelchair) to demonstrate improved LE strength and balance.    Baseline  08/11/17 38 sec    Time  4    Period  Weeks     Status  Achieved      PT SHORT TERM GOAL #5   Title  Patient will improve gait velocity to 1.30 ft/sec to demonstrate progression toward ultimate goal of >1.8 ft/sec (target date also 08/06/17)    Baseline  08/11/17  1.49 ft/sec    Time  2    Period  Weeks    Status  Achieved        PT Long Term Goals - 08/11/17 1918      PT LONG TERM GOAL #1   Title  Patient and caregiver will be independent in completing updated HEP. (Target for all goals 09/05/17)    Time  8    Period  Weeks    Status  New      PT LONG TERM GOAL #2   Title  Patient will ambulate 75 ft with LRAD and minguard assist on level indoor surface.     Time  8    Period  Weeks    Status  New      PT LONG TERM GOAL #3   Title  Patient will decrease 5x sit to stand to <50 seconds (indicative of improved LE strength and balance); 08/11/17 target changed to <30 seconds    Baseline  08/11/17 38 sec    Time  8    Period  Weeks    Status  New            Plan - 08/18/17 1612    Clinical Impression Statement  Pt continues to have difficulty with turning in preparation for stand to sit transfers with use of RW;  pt required mod assist to maintain balance in stepping backwards in preparation for stand to sit transfers, with no significant improvement noted with repetition.  Pt required bil. UE support for safety with standing balance exercises     Rehab Potential  Good    Clinical Impairments Affecting Rehab Potential  dementia (+support of wife and hired caregiver)    PT Frequency  2x /  week    PT Duration  8 weeks    PT Treatment/Interventions  ADLs/Self Care Home Management;Functional mobility training;Stair training;Gait training;DME Instruction;Therapeutic activities;Therapeutic exercise;Balance training;Neuromuscular re-education;Cognitive remediation;Patient/family education;Orthotic Fit/Training;Wheelchair mobility training;Passive range of motion;Visual/perceptual remediation/compensation    PT Next Visit Plan   LE strengthening; standing balance; gait training with RW with turns; practice approaching a chair to sit down - balance exercises to add to HEP? (would need assistance with these exs)    Consulted and Agree with Plan of Care  Patient;Family member/caregiver    Family Member Consulted  wife Normand Sloop), caregiver Mickel Baas)       Patient will benefit from skilled therapeutic intervention in order to improve the following deficits and impairments:  Abnormal gait, Decreased activity tolerance, Decreased balance, Decreased cognition, Decreased mobility, Decreased knowledge of use of DME, Decreased coordination, Decreased range of motion, Decreased safety awareness, Decreased strength, Difficulty walking, Postural dysfunction, Impaired vision/preception, Impaired UE functional use  Visit Diagnosis: Unsteadiness on feet  Other abnormalities of gait and mobility     Problem List Patient Active Problem List   Diagnosis Date Noted  . Cerebrovascular accident (CVA) ([place]) 04/15/2017  . Acute on chronic diastolic CHF (congestive heart failure) ([place])   . Leukocytosis 03/23/2017  . Essential hypertension 03/07/2017  . Weakness generalized 02/18/2017  . Chronic indwelling Foley catheter 11/22/2016  . Enlarged prostate 11/22/2016  . Urinary retention 10/31/2016  . Diabetes mellitus with complication ([place])   . Renal insufficiency 10/25/2016  . Hx: UTI (urinary tract infection) 10/20/2016  . Hypothyroidism 09/15/2016  . Gastroesophageal reflux disease without esophagitis 07/09/2015  . Falls 06/22/2015  . Diabetes mellitus type II, uncontrolled ([place]) 05/29/2015  . Paroxysmal atrial fibrillation ([place]) 05/29/2015  . HLD (hyperlipidemia) 05/29/2015  . Hx of completed stroke 05/29/2015    Alda Lea, PT 08/18/2017, 4:19 PM  [facility] [address] [place], [place], [address] Phone: [phone]   Fax:  [phone]  Name: Gerald Holt. MRN: [id number] Date of Birth: 1923/11/27

## 2017-08-18 NOTE — Therapy (Signed)
[facility] [address] [place], [place], [address] Phone: [phone]   Fax:  [phone]  Occupational Therapy Treatment  Patient Details  Name: Gerald Holt. MRN: [id number] Date of Birth: 1924-01-02 Referring Provider: Dr. Tomi Likens   Encounter Date: 08/18/2017  OT End of Session - 08/18/17 1642    Visit Number  7    Number of Visits  17    Date for OT Re-Evaluation  09/23/17    Authorization Type  Medicare    Authorization - Visit Number  7    Authorization - Number of Visits  10    OT Start Time  0932    OT Stop Time  1445    OT Time Calculation (min)  42 min    Activity Tolerance  Patient tolerated treatment well    Behavior During Therapy  [facility] for tasks assessed/performed       Past Medical History:  Diagnosis Date  . A-fib ([place])   . C. difficile diarrhea   . Cataracts, bilateral   . Chronic dermatitis   . Diabetes type 2, controlled ([place]) 2000  . History of chicken pox   . Mumps    Adult  . Retinopathy   . Shingles   . Stroke [facility]) Nov 2011  . Undescended testicle, unilateral    Right  . Whooping cough     Past Surgical History:  Procedure Laterality Date  . CARDIAC VALVE REPLACEMENT    . PRE-MALIGNANT / BENIGN SKIN LESION EXCISION    . TESTICLE SURGERY     Right, undescended  . TOOTH EXTRACTION      There were no vitals filed for this visit.  Subjective Assessment - 08/18/17 1636    Subjective   I think I am making some good progress    Patient is accompained by:  Family member    Pertinent History      PMH-dementia, DM, PAF, CVA, HOH Personal factors: age, pt's cognitive status, presence of left inattention/neglect, Recent CVA 04/15/17, hx of CVA 2011    Patient Stated Goals  regain functional use of LUE for playing piano    Currently in Pain?  No/denies    Multiple Pain Sites  No                   OT Treatments/Exercises (OP) - 08/18/17 1637      ADLs    Eating  Wife reports patient is using his left hand to assist with cutting his food at meal times now.      Grooming  Wife stated that patient used to stand to shave, but now he sits to shave.  Encouraged wife to have patient stand with supervision to min assist to shave.      Cooking  Patient able to stand at kitchen counter to wash dishes.  Patient able to stand with supervision if at least one hand in contact with the counter surface.  With both hands engaged in task, patient had difficulty maintaining erect piosture and balance was more challenging.  Patient's wife and caregiver present, and will attempt to have patient assist with dishes in standing at home.  Practiced side stepping and turning while at counter.  Patient with improved ability to step left leg toward left today.        Fine Motor Coordination   Other Fine Motor Exercises  Added exercise program for coordiantion and in hand manipulation for left UE  Balance Exercises - 08/18/17 1610      Balance Exercises: Standing   Other Standing Exercises  Pt performed standing balance exercises including forward, backward and sidestepping 10 reps each:  marching in place x 10 reps each        OT Education - 08/18/17 1641    Education provided  Yes    Education Details  Stand balance at counter top, FM coord activities for home    Person(s) Educated  Patient;Spouse;Caregiver(s)    Methods  Explanation;Demonstration    Comprehension  Returned demonstration;Need further instruction;Verbalized understanding       OT Short Term Goals - 08/18/17 1643      OT SHORT TERM GOAL #1   Title  Patient / caregiver I with HEP- 08/23/17    Status  Achieved      OT SHORT TERM GOAL #2   Title  Pt will donn shirt with min A/ v.c    Status  On-going      OT SHORT TERM GOAL #3   Title  Pt will perfrom UB bathing with min A/ VC.     Status  On-going      OT SHORT TERM GOAL #4   Title  Pt will perform tabletop scanning tasks with  75% accuracy in prep for ADLs.    Status  On-going      OT SHORT TERM GOAL #5   Title  Pt will perform shower transfers consistently with min A.        OT Long Term Goals - 08/11/17 1642      OT LONG TERM GOAL #1   Title  Pt will demonstrate improved LUE coordination  as evidenced by perfroming 9 hole peg test in 63 secs or less- due 09/21/17    Status  On-going      OT LONG TERM GOAL #2   Title  Pt demonstrate adequate standing balance to assist with pulling up pants with minguard assist for balance.    Status  On-going      OT LONG TERM GOAL #3   Title  Pt will donn pants with mod A    Status  On-going      OT LONG TERM GOAL #4   Title  Pt will perfrom tabletop scanning with 85% accuracy of better for increased ease with ADLs.    Status  On-going      OT LONG TERM GOAL #5   Title  Pt will consistently use LUE as a non dominant assist for ADLs, and playing the piano    Status  On-going      OT LONG TERM GOAL #6   Title  Pt will demonstrate ability to retrieve a lightweight object at 110 shoulder flexion.    Status  On-going            Plan - 08/18/17 1642    Clinical Impression Statement  Patient continues to improve with functional mobility, and functional use of left UE    Current Impairments/barriers affecting progress:  severity of deficits, L homonymous hemianopsia, cognitive deficits, advanced age    OT Frequency  2x / week    OT Duration  8 weeks    OT Treatment/Interventions  Self-care/ADL training;Moist Heat;Fluidtherapy;DME and/or AE instruction;Splinting;Patient/family education;Balance training;Therapeutic exercises;Ultrasound;Therapeutic exercise;Therapeutic activities;Cognitive remediation/compensation;Passive range of motion;Functional Mobility Training;Neuromuscular education;Electrical Stimulation;Cryotherapy;Parrafin;Energy conservation;Manual Therapy;Visual/perceptual remediation/compensation    OT Home Exercise Plan  FM Coordination    Consulted  and Agree with Plan of Care  Patient;Family member/caregiver  Family Member Consulted  wife, Normand Sloop,  and hired caregiver, Mickel Baas       Patient will benefit from skilled therapeutic intervention in order to improve the following deficits and impairments:  Abnormal gait, Decreased coordination, Decreased range of motion, Difficulty walking, Impaired flexibility, Decreased safety awareness, Decreased endurance, Decreased activity tolerance, Decreased knowledge of precautions, Impaired UE functional use, Decreased balance, Decreased knowledge of use of DME, Impaired vision/preception, Impaired perceived functional ability, Decreased strength, Decreased mobility, Decreased cognition  Visit Diagnosis: Unsteadiness on feet  Other lack of coordination  Frontal lobe and executive function deficit  Muscle weakness (generalized)    Problem List Patient Active Problem List   Diagnosis Date Noted  . Cerebrovascular accident (CVA) ([place]) 04/15/2017  . Acute on chronic diastolic CHF (congestive heart failure) ([place])   . Leukocytosis 03/23/2017  . Essential hypertension 03/07/2017  . Weakness generalized 02/18/2017  . Chronic indwelling Foley catheter 11/22/2016  . Enlarged prostate 11/22/2016  . Urinary retention 10/31/2016  . Diabetes mellitus with complication ([place])   . Renal insufficiency 10/25/2016  . Hx: UTI (urinary tract infection) 10/20/2016  . Hypothyroidism 09/15/2016  . Gastroesophageal reflux disease without esophagitis 07/09/2015  . Falls 06/22/2015  . Diabetes mellitus type II, uncontrolled ([place]) 05/29/2015  . Paroxysmal atrial fibrillation ([place]) 05/29/2015  . HLD (hyperlipidemia) 05/29/2015  . Hx of completed stroke 05/29/2015    Mariah Milling, OTR/L 08/18/2017, 4:44 PM  [facility] [address] [place], [place], [address] Phone: [phone]   Fax:  [phone]  Name: Gerald Holt. MRN:  [id number] Date of Birth: September 23, 1924

## 2017-08-18 NOTE — Patient Instructions (Signed)
  Coordination Activities  Perform the following activities for 5-10  minutes 2-3 times per day with left hand(s).   Flip cards 1 at a time as fast as you can.  Pick up coins and place in container or coin bank.  Pick up coins and stack.  Pick up coins one at a time until you get 5-10 in your hand, then move coins from palm to fingertips to stack one at a time.

## 2017-08-19 ENCOUNTER — Ambulatory Visit (INDEPENDENT_AMBULATORY_CARE_PROVIDER_SITE_OTHER): Payer: Medicare Other | Admitting: Podiatry

## 2017-08-19 DIAGNOSIS — M79674 Pain in right toe(s): Secondary | ICD-10-CM

## 2017-08-19 DIAGNOSIS — M79675 Pain in left toe(s): Secondary | ICD-10-CM

## 2017-08-19 DIAGNOSIS — B351 Tinea unguium: Secondary | ICD-10-CM

## 2017-08-19 NOTE — Progress Notes (Signed)
Subjective: 83 y.o. returns the office today for painful, elongated, thickened toenails which he cannot trim himself. Denies any redness or drainage around the nails. Denies any open sores, blistering to his feet. Denies any acute changes since last appointment and no new complaints today. Denies any systemic complaints such as fevers, chills, nausea, vomiting.   Objective: NAD; presents with caregier DP/PT pulses palpable 1/4, CRT less than 3 seconds Nails hypertrophic, dystrophic, elongated, brittle, discolored 10. There is incurvation along the medial and lateral hallux toenails. There is tenderness overlying the nails 1-5 bilaterally. There is no surrounding erythema or drainage along the nail sites.  There is no area of skin breakdown, open sores, blistering to the left hallux or bilateral feet.  No other areas of tenderness bilateral lower extremities. No overlying edema, erythema, increased warmth. No pain with calf compression, swelling, warmth, erythema.  Assessment: Patient presents with symptomatic onychomycosis  Plan: -Treatment options including alternatives, risks, complications were discussed -Nails sharply debrided 10 without complication/bleeding. -Discussed daily foot inspection. If there are any changes, to call the office immediately.  -Follow-up in 3 months or sooner if any problems are to arise. In the meantime, encouraged to call the office with any questions, concerns, changes symptoms.  Celesta Gentile, DPM

## 2017-08-20 ENCOUNTER — Ambulatory Visit: Payer: Medicare Other | Admitting: Occupational Therapy

## 2017-08-20 ENCOUNTER — Encounter: Payer: Self-pay | Admitting: Physical Therapy

## 2017-08-20 ENCOUNTER — Ambulatory Visit: Payer: Medicare Other | Admitting: Physical Therapy

## 2017-08-20 DIAGNOSIS — R531 Weakness: Secondary | ICD-10-CM | POA: Diagnosis not present

## 2017-08-20 DIAGNOSIS — M6281 Muscle weakness (generalized): Secondary | ICD-10-CM

## 2017-08-20 DIAGNOSIS — R2689 Other abnormalities of gait and mobility: Secondary | ICD-10-CM

## 2017-08-20 DIAGNOSIS — R278 Other lack of coordination: Secondary | ICD-10-CM | POA: Diagnosis not present

## 2017-08-20 DIAGNOSIS — R41844 Frontal lobe and executive function deficit: Secondary | ICD-10-CM | POA: Diagnosis not present

## 2017-08-20 DIAGNOSIS — R2681 Unsteadiness on feet: Secondary | ICD-10-CM

## 2017-08-20 NOTE — Therapy (Signed)
[facility] [address] [place], [place], [address] Phone: [phone]   Fax:  [phone]  Physical Therapy Treatment  Patient Details  Name: Gerald Holt. MRN: [id number] Date of Birth: July 13, 1924 Referring Provider: Metta Clines MD   Encounter Date: 08/20/2017  PT End of Session - 08/20/17 1208    Visit Number  9    Number of Visits  17    Date for PT Re-Evaluation  09/05/17    Authorization Type  Medicare; Gcode & progress note 10th visits    Authorization Time Period  07/07/17 to 09/05/17    PT Start Time  1015    PT Stop Time  1058    PT Time Calculation (min)  43 min    Equipment Utilized During Treatment  Gait belt    Activity Tolerance  Patient tolerated treatment well    Behavior During Therapy  [facility] for tasks assessed/performed       Past Medical History:  Diagnosis Date  . A-fib ([place])   . C. difficile diarrhea   . Cataracts, bilateral   . Chronic dermatitis   . Diabetes type 2, controlled ([place]) 2000  . History of chicken pox   . Mumps    Adult  . Retinopathy   . Shingles   . Stroke [facility]) Nov 2011  . Undescended testicle, unilateral    Right  . Whooping cough     Past Surgical History:  Procedure Laterality Date  . CARDIAC VALVE REPLACEMENT    . PRE-MALIGNANT / BENIGN SKIN LESION EXCISION    . TESTICLE SURGERY     Right, undescended  . TOOTH EXTRACTION      There were no vitals filed for this visit.  Subjective Assessment - 08/20/17 1018    Subjective  no falls or pain; no doing exercises.  wife reports "he didn't want to do them."    Patient is accompained by:  Family member caregiver    Patient Stated Goals  I want to be able to walk unassisted.     Currently in Pain?  No/denies                      [facility] Adult PT Treatment/Exercise - 08/20/17 1059      Ambulation/Gait   Ambulation/Gait Assistance  4: Min assist    Ambulation/Gait Assistance Details   responded well to cues to "aim for left wheel" with LLE to improve abduction and decrease LLE adduction with gait    Ambulation Distance (Feet)  130 Feet    Assistive device  Rolling walker    Gait Pattern  Step-through pattern;Decreased weight shift to left;Left foot flat;Trunk flexed;Narrow base of support;Decreased step length - right;Decreased step length - left      Knee/Hip Exercises: Standing   Heel Raises  Both;20 reps;Limitations    Heel Raises Limitations  toe raises x 20 reps    Hip Flexion  Stengthening;Both;10 reps;Knee bent alternating    Hip Abduction  Stengthening;Both;10 reps alternating    Hip Extension  Stengthening;Both;10 reps alternating    Other Standing Knee Exercises  static standing with RLE on 6" step with mod A and cues for midline awareness for glute med activation and strengthening 3x30 sec each             PT Education - 08/20/17 1208    Education provided  Yes    Education Details  HEP    Person(s) Educated  Patient;Spouse;Caregiver(s)  Methods  Explanation;Demonstration;Handout    Comprehension  Verbalized understanding;Need further instruction;Returned demonstration       PT Short Term Goals - 08/11/17 1910      PT SHORT TERM GOAL #1   Title  Patient and caregiver will be independent in completing HEP. (Target for all goals 08/06/17)    Time  4    Period  Weeks    Status  Achieved      PT SHORT TERM GOAL #2   Title  Assess gait velocity and set STG/LTG as appropriate.     Baseline  11/5  1.01 ft/sec 08/11/17 1.49 ft/sec    Time  4    Period  Weeks    Status  Achieved      PT SHORT TERM GOAL #3   Title  Patient will ambulate 50 ft on level indoor surface with LRAD and minimal assistance.     Time  4    Period  Weeks    Status  Achieved      PT SHORT TERM GOAL #4   Title  Patient will decrease 5 x sit to stand test to <55 seconds (with use of armrests from wheelchair) to demonstrate improved LE strength and balance.    Baseline   08/11/17 38 sec    Time  4    Period  Weeks    Status  Achieved      PT SHORT TERM GOAL #5   Title  Patient will improve gait velocity to 1.30 ft/sec to demonstrate progression toward ultimate goal of >1.8 ft/sec (target date also 08/06/17)    Baseline  08/11/17  1.49 ft/sec    Time  2    Period  Weeks    Status  Achieved        PT Long Term Goals - 08/11/17 1918      PT LONG TERM GOAL #1   Title  Patient and caregiver will be independent in completing updated HEP. (Target for all goals 09/05/17)    Time  8    Period  Weeks    Status  New      PT LONG TERM GOAL #2   Title  Patient will ambulate 75 ft with LRAD and minguard assist on level indoor surface.     Time  8    Period  Weeks    Status  New      PT LONG TERM GOAL #3   Title  Patient will decrease 5x sit to stand to <50 seconds (indicative of improved LE strength and balance); 08/11/17 target changed to <30 seconds    Baseline  08/11/17 38 sec    Time  8    Period  Weeks    Status  New            Plan - 08/20/17 1209    Clinical Impression Statement  Pt tolerated session well today and helped to establish standing strengthening and balance HEP.  Pt's wife reports parallel bars will be installed in home on Friday so HEP performed at parallel bars.  Making slow progress towards goals.    Clinical Impairments Affecting Rehab Potential  dementia (+support of wife and hired caregiver)    PT Treatment/Interventions  ADLs/Self Care Home Management;Functional mobility training;Stair training;Gait training;DME Instruction;Therapeutic activities;Therapeutic exercise;Balance training;Neuromuscular re-education;Cognitive remediation;Patient/family education;Orthotic Fit/Training;Wheelchair mobility training;Passive range of motion;Visual/perceptual remediation/compensation    PT Next Visit Plan  needs g-code; LE strengthening; standing balance; gait training with RW with turns; practice approaching a chair  to sit down -  review HEP and try to help ensure compliance; need glute strengthening    Consulted and Agree with Plan of Care  Patient;Family member/caregiver    Family Member Consulted  wife Normand Sloop), caregiver Mickel Baas)       Patient will benefit from skilled therapeutic intervention in order to improve the following deficits and impairments:  Abnormal gait, Decreased activity tolerance, Decreased balance, Decreased cognition, Decreased mobility, Decreased knowledge of use of DME, Decreased coordination, Decreased range of motion, Decreased safety awareness, Decreased strength, Difficulty walking, Postural dysfunction, Impaired vision/preception, Impaired UE functional use  Visit Diagnosis: Unsteadiness on feet  Other lack of coordination  Muscle weakness (generalized)  Other abnormalities of gait and mobility     Problem List Patient Active Problem List   Diagnosis Date Noted  . Cerebrovascular accident (CVA) ([place]) 04/15/2017  . Acute on chronic diastolic CHF (congestive heart failure) ([place])   . Leukocytosis 03/23/2017  . Essential hypertension 03/07/2017  . Weakness generalized 02/18/2017  . Chronic indwelling Foley catheter 11/22/2016  . Enlarged prostate 11/22/2016  . Urinary retention 10/31/2016  . Diabetes mellitus with complication ([place])   . Renal insufficiency 10/25/2016  . Hx: UTI (urinary tract infection) 10/20/2016  . Hypothyroidism 09/15/2016  . Gastroesophageal reflux disease without esophagitis 07/09/2015  . Falls 06/22/2015  . Diabetes mellitus type II, uncontrolled ([place]) 05/29/2015  . Paroxysmal atrial fibrillation ([place]) 05/29/2015  . HLD (hyperlipidemia) 05/29/2015  . Hx of completed stroke 05/29/2015     Laureen Abrahams, PT, DPT 08/20/17 12:12 PM    [facility] [address] [place], [place], [address] Phone: [phone]   Fax:  [phone]  Name: Gerald Holt. MRN: [id number] Date  of Birth: 07/31/1924

## 2017-08-20 NOTE — Therapy (Signed)
[facility] [address] [place], [place], [address] Phone: [phone]   Fax:  [phone]  Occupational Therapy Treatment  Patient Details  Name: Gerald Holt. MRN: [id number] Date of Birth: 10-20-1923 Referring Provider: Dr. Tomi Likens   Encounter Date: 08/20/2017  OT End of Session - 08/20/17 2007    Visit Number  8    Number of Visits  17    Date for OT Re-Evaluation  09/23/17    Authorization Type  Medicare    Authorization - Visit Number  8    Authorization - Number of Visits  10    OT Start Time  3299    OT Stop Time  1145    OT Time Calculation (min)  40 min    Activity Tolerance  Patient tolerated treatment well    Behavior During Therapy  [facility] for tasks assessed/performed       Past Medical History:  Diagnosis Date  . A-fib ([place])   . C. difficile diarrhea   . Cataracts, bilateral   . Chronic dermatitis   . Diabetes type 2, controlled ([place]) 2000  . History of chicken pox   . Mumps    Adult  . Retinopathy   . Shingles   . Stroke [facility]) Nov 2011  . Undescended testicle, unilateral    Right  . Whooping cough     Past Surgical History:  Procedure Laterality Date  . CARDIAC VALVE REPLACEMENT    . PRE-MALIGNANT / BENIGN SKIN LESION EXCISION    . TESTICLE SURGERY     Right, undescended  . TOOTH EXTRACTION      There were no vitals filed for this visit.  Subjective Assessment - 08/20/17 1110    Pertinent History      PMH-dementia, DM, PAF, CVA, HOH Personal factors: age, pt's cognitive status, presence of left inattention/neglect, Recent CVA 04/15/17, hx of CVA 2011    Patient Stated Goals  regain functional use of LUE for playing piano    Currently in Pain?  No/denies                  Treatment: Pt ambulated with walker from mat to table with minguard and min vc. To avoid crossing feet when ambulating. Seated at table therapist reviewed flipping cards activity from HEP.  Pt required mod/ max v.c. Pt demonstrates difficulty with forearm supination/ pronation. Holding foam noodle, pt performed LUE forearm supination and pronation and ulnar radial deviation to strike a target for increased A/ROM and control. Exercises with focus on isolated movement of each individual digit in prep for playing piano, mod v.c           OT Short Term Goals - 08/18/17 1643      OT SHORT TERM GOAL #1   Title  Patient / caregiver I with HEP- 08/23/17    Status  Achieved      OT SHORT TERM GOAL #2   Title  Pt will donn shirt with min A/ v.c    Status  On-going      OT SHORT TERM GOAL #3   Title  Pt will perfrom UB bathing with min A/ VC.     Status  On-going      OT SHORT TERM GOAL #4   Title  Pt will perform tabletop scanning tasks with 75% accuracy in prep for ADLs.    Status  On-going      OT SHORT TERM GOAL #5   Title  Pt will perform shower transfers consistently with min A.        OT Long Term Goals - 08/11/17 1642      OT LONG TERM GOAL #1   Title  Pt will demonstrate improved LUE coordination  as evidenced by perfroming 9 hole peg test in 63 secs or less- due 09/21/17    Status  On-going      OT LONG TERM GOAL #2   Title  Pt demonstrate adequate standing balance to assist with pulling up pants with minguard assist for balance.    Status  On-going      OT LONG TERM GOAL #3   Title  Pt will donn pants with mod A    Status  On-going      OT LONG TERM GOAL #4   Title  Pt will perfrom tabletop scanning with 85% accuracy of better for increased ease with ADLs.    Status  On-going      OT LONG TERM GOAL #5   Title  Pt will consistently use LUE as a non dominant assist for ADLs, and playing the piano    Status  On-going      OT LONG TERM GOAL #6   Title  Pt will demonstrate ability to retrieve a lightweight object at 110 shoulder flexion.    Status  On-going            Plan - 08/20/17 2008    Clinical Impression Statement  Pt is progressing  towards goals for LUE functional use.    Rehab Potential  Fair    Current Impairments/barriers affecting progress:  severity of deficits, L homonymous hemianopsia, cognitive deficits, advanced age    OT Frequency  2x / week    OT Duration  8 weeks    OT Treatment/Interventions  Self-care/ADL training;Moist Heat;Fluidtherapy;DME and/or AE instruction;Splinting;Patient/family education;Balance training;Therapeutic exercises;Ultrasound;Therapeutic exercise;Therapeutic activities;Cognitive remediation/compensation;Passive range of motion;Functional Mobility Training;Neuromuscular education;Electrical Stimulation;Cryotherapy;Parrafin;Energy conservation;Manual Therapy;Visual/perceptual remediation/compensation    Plan  continue to address ADLS, functional mobility, functional use of LUE       Patient will benefit from skilled therapeutic intervention in order to improve the following deficits and impairments:  Abnormal gait, Decreased coordination, Decreased range of motion, Difficulty walking, Impaired flexibility, Decreased safety awareness, Decreased endurance, Decreased activity tolerance, Decreased knowledge of precautions, Impaired UE functional use, Decreased balance, Decreased knowledge of use of DME, Impaired vision/preception, Impaired perceived functional ability, Decreased strength, Decreased mobility, Decreased cognition  Visit Diagnosis: Other lack of coordination  Muscle weakness (generalized)  Frontal lobe and executive function deficit  Weakness generalized  Other abnormalities of gait and mobility    Problem List Patient Active Problem List   Diagnosis Date Noted  . Cerebrovascular accident (CVA) ([place]) 04/15/2017  . Acute on chronic diastolic CHF (congestive heart failure) ([place])   . Leukocytosis 03/23/2017  . Essential hypertension 03/07/2017  . Weakness generalized 02/18/2017  . Chronic indwelling Foley catheter 11/22/2016  . Enlarged prostate 11/22/2016  . Urinary  retention 10/31/2016  . Diabetes mellitus with complication ([place])   . Renal insufficiency 10/25/2016  . Hx: UTI (urinary tract infection) 10/20/2016  . Hypothyroidism 09/15/2016  . Gastroesophageal reflux disease without esophagitis 07/09/2015  . Falls 06/22/2015  . Diabetes mellitus type II, uncontrolled ([place]) 05/29/2015  . Paroxysmal atrial fibrillation ([place]) 05/29/2015  . HLD (hyperlipidemia) 05/29/2015  . Hx of completed stroke 05/29/2015    Gerald Holt 08/20/2017, 8:10 PM  [facility] [address] [place], [place], [address]  Phone: [phone]   Fax:  [phone]  Name: Gerald Holt. MRN: [id number] Date of Birth: 1924-02-14

## 2017-08-20 NOTE — Patient Instructions (Signed)
Toe / Heel Raise (Standing)    Standing with support, raise heels, then rock back on heels and raise toes. Repeat _20___ times.  Standing Marching   Using a chair if necessary, march in place.  Repeat _20___ times. Do __1-2__ sessions per day.    Hip Backward Kick   Using a chair for balance, keep legs shoulder width apart and toes pointed for- ward. Slowly extend one leg back, keeping knee straight. Do not lean forward. Alternate legs. Repeat __20__ times. Do __1-2__ sessions per day.    Hip Side Kick   Holding a chair for balance, keep legs shoulder width apart and toes pointed forward. Swing a leg out to side, keeping knee straight. Do not lean. Alternate legs. Repeat __20__ times. Do _1-2___ sessions per day.

## 2017-08-21 ENCOUNTER — Encounter: Payer: Self-pay | Admitting: Physician Assistant

## 2017-08-21 ENCOUNTER — Ambulatory Visit: Payer: Medicare Other | Admitting: Physical Therapy

## 2017-08-22 ENCOUNTER — Encounter: Payer: Self-pay | Admitting: Physician Assistant

## 2017-08-22 DIAGNOSIS — R339 Retention of urine, unspecified: Secondary | ICD-10-CM | POA: Diagnosis not present

## 2017-08-22 DIAGNOSIS — R82998 Other abnormal findings in urine: Secondary | ICD-10-CM | POA: Diagnosis not present

## 2017-08-25 ENCOUNTER — Ambulatory Visit: Payer: Medicare Other | Admitting: Occupational Therapy

## 2017-08-25 ENCOUNTER — Ambulatory Visit: Payer: Medicare Other | Admitting: Physical Therapy

## 2017-08-27 ENCOUNTER — Encounter: Payer: Self-pay | Admitting: Physician Assistant

## 2017-08-28 ENCOUNTER — Encounter: Payer: Medicare Other | Admitting: Occupational Therapy

## 2017-08-28 ENCOUNTER — Ambulatory Visit: Payer: Medicare Other | Admitting: Physical Therapy

## 2017-08-28 DIAGNOSIS — R2689 Other abnormalities of gait and mobility: Secondary | ICD-10-CM | POA: Diagnosis not present

## 2017-08-28 DIAGNOSIS — I639 Cerebral infarction, unspecified: Secondary | ICD-10-CM | POA: Diagnosis not present

## 2017-08-28 DIAGNOSIS — G8190 Hemiplegia, unspecified affecting unspecified side: Secondary | ICD-10-CM | POA: Diagnosis not present

## 2017-09-01 ENCOUNTER — Ambulatory Visit: Payer: Medicare Other | Admitting: Occupational Therapy

## 2017-09-01 ENCOUNTER — Ambulatory Visit: Payer: Medicare Other | Admitting: Physical Therapy

## 2017-09-02 ENCOUNTER — Ambulatory Visit: Payer: Medicare Other | Admitting: Podiatry

## 2017-09-03 ENCOUNTER — Ambulatory Visit: Payer: Medicare Other | Admitting: Occupational Therapy

## 2017-09-03 ENCOUNTER — Ambulatory Visit: Payer: Medicare Other | Admitting: Physical Therapy

## 2017-09-04 ENCOUNTER — Ambulatory Visit: Payer: Medicare Other | Admitting: Physical Therapy

## 2017-09-05 ENCOUNTER — Encounter: Payer: Self-pay | Admitting: Physician Assistant

## 2017-09-08 ENCOUNTER — Ambulatory Visit: Payer: Medicare Other | Admitting: Physical Therapy

## 2017-09-08 ENCOUNTER — Encounter: Payer: Self-pay | Admitting: Neurology

## 2017-09-08 ENCOUNTER — Encounter: Payer: Medicare Other | Admitting: Occupational Therapy

## 2017-09-08 ENCOUNTER — Encounter: Payer: Self-pay | Admitting: Occupational Therapy

## 2017-09-08 ENCOUNTER — Ambulatory Visit (INDEPENDENT_AMBULATORY_CARE_PROVIDER_SITE_OTHER): Payer: Medicare Other | Admitting: Neurology

## 2017-09-08 VITALS — BP 122/60 | HR 50 | Ht 66.0 in | Wt 153.8 lb

## 2017-09-08 DIAGNOSIS — E119 Type 2 diabetes mellitus without complications: Secondary | ICD-10-CM

## 2017-09-08 DIAGNOSIS — E538 Deficiency of other specified B group vitamins: Secondary | ICD-10-CM | POA: Diagnosis not present

## 2017-09-08 DIAGNOSIS — Z794 Long term (current) use of insulin: Secondary | ICD-10-CM | POA: Diagnosis not present

## 2017-09-08 DIAGNOSIS — I4891 Unspecified atrial fibrillation: Secondary | ICD-10-CM | POA: Diagnosis not present

## 2017-09-08 DIAGNOSIS — I639 Cerebral infarction, unspecified: Secondary | ICD-10-CM | POA: Diagnosis not present

## 2017-09-08 DIAGNOSIS — I63431 Cerebral infarction due to embolism of right posterior cerebral artery: Secondary | ICD-10-CM

## 2017-09-08 DIAGNOSIS — F0150 Vascular dementia without behavioral disturbance: Secondary | ICD-10-CM

## 2017-09-08 NOTE — Therapy (Unsigned)
[facility] [address] [place], [place], [address] Phone: [phone]   Fax:  [phone]  Patient Details  Name: Gerald Holt. MRN: [id number] Date of Birth: 1924-04-10 Referring Provider:  No ref. provider found    OCCUPATIONAL THERAPY DISCHARGE SUMMARY  Visits from Start of Care: 8  Current functional level related to goals / functional outcomes: Patient's wife called to cancel remaining OT visits.  Patient has a UTI, and family reports increased fatigue.  Unable to get patient out of the house during bad weather, and concerned about cold and flu season.  Patient may be appropriate to return to OP level rehab once family feels this is a reasonable expectation for them.    Patient has not met OT goals due to illness and decision to receive in home services.  Remaining deficits:  Poor balance, lethargy, hemianopsia, decreased coordination, hemiplegia    Education / Equipment: HEP- Coordination  Plan: Patient agrees to discharge.  Patient goals were not met. Patient is being discharged due to the patient's request.  ?????        Mariah Milling, OTR/L 09/08/2017, 2:36 PM  [facility] [address] [place], [place], [address] Phone: [phone]   Fax:  [phone]

## 2017-09-08 NOTE — Progress Notes (Signed)
NEUROLOGY FOLLOW UP OFFICE NOTE  Gerald Holt [id number]  HISTORY OF PRESENT ILLNESS: Gerald Holt is a 83 year old man with vascular dementia, type 2 diabetes, paroxysmal a-fib, and history of stroke who follows up for stroke and vascular dementia.  He is accompanied by his wife and nurse who supplements history.  History also is supplemented by recent hospital admission notes.   UPDATE: He presented to the ED at [facility] on 07/27/17 following a mechanical fall out of bed.  CT of head revealed expected evolution of the right PCA territory infarct from July but no acute intracranial hemorrhage or trauma. Due to illness or weather, he has had to miss recent PT sessions.  His wife is wondering about home PT.  Overall, he is doing well.  He has a caregiver to help out at home.   HISTORY: I  Vascular Dementia: Since February 2017, he has had a progressive decline in cognition.  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  He plays piano daily.  He started having trouble remembering which pieces he played and sometimes how to play them.  He also has left the stove on and water running.  He started having trouble getting dress and has put on his shirt incorrectly.  Marland Kitchen  He has had several falls.  He had to stop driving.  He uses a cane but it was recommended to use a walker.  He is hard of hearing.  He started a memory program based out of [place].  Genetic testing reportedly showed he was negative for APOE.  He was advised to start supplements and to stop statin therapy (or at least have LDL be around 70).   There is no family history of dementia, but his family passed away at young age.   II  Stroke: He had a TIA on 05/30/16 for left arm weakness and numbness, slurred speech and feeling foggy in the head.  CT of head was personally reviewed and revealed a left parietal meningioma but no acute abnormalities.  He later had an MRI of the brain on 06/08/16, which was  personally reviewed and revealed moderate atrophy and chronic small vessel disease and a left parietal meningioma, stable but no acute/subacute stroke or bleed.    He was admitted to [facility] from 04/11/17 to 04/16/17 for generalized weakness who was found to be in atrial flutter with RVR.  He has history of paroxysmal atrial fibrillation but was taken off of Eliquis and switched to ASA following a fall.  CT and MRIs were personally reviewed.  CT of head revealed no acute findings.  MRI of brain demonstrated embolic-looking right PCA territory infarct.  CTA of head and neck revealed no large vessel occlusion or high-grade stenosis.  TTE showed EF 55-60% and grade 2 diastolic dysfunction but no cardiac source of emboli, such as PFO.  LDL was 73 and Hgb A1c was 7.3.   He was started on Eliquis.  He has been at a SNF.  He is limited in ambulation and requires assistance, such as a walker.  PAST MEDICAL HISTORY: Past Medical History:  Diagnosis Date  . A-fib ([place])   . C. difficile diarrhea   . Cataracts, bilateral   . Chronic dermatitis   . Diabetes type 2, controlled ([place]) 2000  . History of chicken pox   . Mumps    Adult  . Retinopathy   . Shingles   . Stroke [facility]) Nov 2011  .  Undescended testicle, unilateral    Right  . Whooping cough     MEDICATIONS: Current Outpatient Medications on File Prior to Visit  Medication Sig Dispense Refill  . amiodarone (PACERONE) 200 MG tablet Take 1 tablet (200 mg total) by mouth 2 (two) times daily. Discontinue after 04/23/17 doses. (Patient not taking: Reported on 08/01/2017)    . apixaban (ELIQUIS) 5 MG TABS tablet Take 1 tablet (5 mg total) by mouth 2 (two) times daily. (Patient not taking: Reported on 08/01/2017) 180 tablet 1  . Calcium Carbonate Antacid (ALKA-SELTZER ANTACID PO) Take 2 tablets by mouth daily as needed (for indegestion).     . Cranberry 200 MG CAPS Take 2 capsules 2 (two) times daily by mouth.    . cyanocobalamin (,VITAMIN B-12,)  1000 MCG/ML injection Inject 1,000 mcg into the muscle every 30 (thirty) days.    Marland Kitchen diltiazem (CARDIZEM CD) 240 MG 24 hr capsule Take 1 capsule (240 mg total) by mouth daily. (Patient not taking: Reported on 09/08/2017) 90 capsule 1  . docusate sodium (COLACE) 100 MG capsule Take 200 mg by mouth daily as needed for mild constipation.     Marland Kitchen ELDERBERRY PO Take 1 tablet 2 (two) times daily by mouth.    . furosemide (LASIX) 20 MG tablet Take 1 tablet (20 mg total) by mouth daily. (Patient not taking: Reported on 08/01/2017) 30 tablet 5  . glucose blood test strip One Touch Verio strips Use as instructed to check fasting sugars once daily .Dx:E11.9 100 each 12  . levothyroxine (SYNTHROID, LEVOTHROID) 50 MCG tablet Take 1 tablet (50 mcg total) by mouth daily. 90 tablet 1  . metFORMIN (GLUCOPHAGE-XR) 500 MG 24 hr tablet TAKE 2 TABLETS WITH BREAKFAST AND AT BEDTIME. Start 04/17/17. 360 tablet 1  . Miconazole Nitrate (TRIPLE PASTE AF) 2 % OINT Apply 1 application topically daily as needed (jock itch).     . Misc Natural Products (FOCUSED MIND PO) Take 1 tablet 2 (two) times daily by mouth.    . nystatin-triamcinolone ointment (MYCOLOG) Apply 1 application 2 (two) times daily topically. 30 g 0  . Omega-3 Fatty Acids (FISH OIL) 1000 MG CAPS Take 1 capsule 2 (two) times daily by mouth.    Marland Kitchen omeprazole (PRILOSEC) 20 MG capsule Take 20 mg by mouth daily.    . Probiotic Product (PROBIOTIC-10 PO) Take 3 capsules daily by mouth.     No current facility-administered medications on file prior to visit.     ALLERGIES: Allergies  Allergen Reactions  . Flomax [Tamsulosin Hcl] Other (See Comments)    Hypotension  . Shellfish Allergy Anaphylaxis  . Aspirin-Dipyridamole Er Other (See Comments)    Headaches   . Other Other (See Comments)    Blood Thinner given in Rehab gave H/As - Not Coumadin/Headaches      FAMILY HISTORY: Family History  Problem Relation Age of Onset  . Pneumonia Mother 55       Deceased    . GI Bleed Father 37       Deceased - Ulcers  . Diabetes Cousin   . Diabetes Brother   . Cancer Paternal Aunt     SOCIAL HISTORY: Social History   Socioeconomic History  . Marital status: Married    Spouse name: Not on file  . Number of children: Not on file  . Years of education: Not on file  . Highest education level: Not on file  Social Needs  . Financial resource strain: Not on file  . Food  insecurity - worry: Not on file  . Food insecurity - inability: Not on file  . Transportation needs - medical: Not on file  . Transportation needs - non-medical: Not on file  Occupational History  . Not on file  Tobacco Use  . Smoking status: Former Research scientist (life sciences)  . Smokeless tobacco: Never Used  . Tobacco comment: Hasn't smoked since age 52  Substance and Sexual Activity  . Alcohol use: No    Alcohol/week: 0.0 oz  . Drug use: No  . Sexual activity: No  Other Topics Concern  . Not on file  Social History Narrative   Lives with wife in a one story home.  Has 1 child.  Retired Armed forces technical officer.  Education: Oceanographer.      REVIEW OF SYSTEMS: Constitutional: No fevers, chills, or sweats, no generalized fatigue, change in appetite Eyes: No visual changes, double vision, eye pain Ear, nose and throat: No hearing loss, ear pain, nasal congestion, sore throat Cardiovascular: No chest pain, palpitations Respiratory:  No shortness of breath at rest or with exertion, wheezes GastrointestinaI: No nausea, vomiting, diarrhea, abdominal pain, fecal incontinence Genitourinary:  No dysuria, urinary retention or frequency Musculoskeletal:  No neck pain, back pain Integumentary: No rash, pruritus, skin lesions Neurological: as above Psychiatric: No depression, insomnia, anxiety Endocrine: No palpitations, fatigue, diaphoresis, mood swings, change in appetite, change in weight, increased thirst Hematologic/Lymphatic:  No purpura, petechiae. Allergic/Immunologic: no itchy/runny eyes, nasal congestion,  recent allergic reactions, rashes  PHYSICAL EXAM: Vitals:   09/08/17 1150  BP: 122/60  Pulse: (!) 50  SpO2: 99%   General: No acute distress.  Patient appears well-groomed.  Head:  Normocephalic/atraumatic Eyes:  Fundi examined but not visualized Neck: supple, no paraspinal tenderness, full range of motion Heart:  Regular rate and rhythm Lungs:  Clear to auscultation bilaterally Back: No paraspinal tenderness Neurological Exam: alert and oriented to person, place, and time. Attention span and concentration intact, recent and remote memory intact, fund of knowledge intact.  Speech fluent and not dysarthric, language intact.  Left  Homonymous hemianopsia.  Otherwise, CN II-XII intact. Bulk and tone normal, muscle strength 5-/5 left upper extremity, 4+/5 left lower extremity, 5/5 right side.  Decreased speed and amplitude of finger-thumb tapping.  Sensation to light touch intact.  Double simultaneous tactile stimulation intact.  Deep tendon reflexes 2+ throughout.  Left hemiparetic gait with walker.    IMPRESSION: 1.  Embolic right PCA territory infarct, likely cardioembolic due to history of atrial fibrillation/atrial flutter with left homonymous hemianopsia. 2.  Vascular dementia 3.  Type 2 diabetes 4.  B12 deficiency  PLAN: 1.  Continue Eliquis 2.  As he is a fall risk and easily is ill during the winter, we will try to get home PT. 3.  Continue metformin and fish oil 4.  Follow up in 6 months.  25 minutes spent face to face with patient, over 50% spent discussing management.  Metta Clines, DO  CC:  [name], PA-C

## 2017-09-08 NOTE — Progress Notes (Signed)
Sent Darlina Guys at [facility] a staff message concerning home health

## 2017-09-08 NOTE — Patient Instructions (Signed)
1.  Continue Eliquis 2.  Continue cholesterol medication 3.  We will try to get physical therapy for home 4.  Continue using the walker 5.  Recommend seeing the eye doctor for visual field testing. 6.  Follow up in 6 months.

## 2017-09-09 ENCOUNTER — Encounter: Payer: Self-pay | Admitting: Physician Assistant

## 2017-09-09 NOTE — Addendum Note (Signed)
Addended by: Clois Comber on: 09/09/2017 07:33 AM   Modules accepted: Orders

## 2017-09-10 ENCOUNTER — Ambulatory Visit: Payer: Medicare Other | Admitting: Physician Assistant

## 2017-09-11 ENCOUNTER — Ambulatory Visit: Payer: Medicare Other | Admitting: Physical Therapy

## 2017-09-11 ENCOUNTER — Encounter: Payer: Medicare Other | Admitting: Occupational Therapy

## 2017-09-12 ENCOUNTER — Other Ambulatory Visit: Payer: Self-pay

## 2017-09-12 ENCOUNTER — Encounter: Payer: Self-pay | Admitting: Physician Assistant

## 2017-09-12 ENCOUNTER — Ambulatory Visit (INDEPENDENT_AMBULATORY_CARE_PROVIDER_SITE_OTHER): Payer: Medicare Other | Admitting: Physician Assistant

## 2017-09-12 VITALS — BP 121/60 | HR 81 | Temp 97.9°F | Resp 16 | Ht 66.0 in | Wt 158.4 lb

## 2017-09-12 DIAGNOSIS — L570 Actinic keratosis: Secondary | ICD-10-CM | POA: Diagnosis not present

## 2017-09-12 DIAGNOSIS — L218 Other seborrheic dermatitis: Secondary | ICD-10-CM | POA: Diagnosis not present

## 2017-09-12 DIAGNOSIS — I639 Cerebral infarction, unspecified: Secondary | ICD-10-CM

## 2017-09-12 DIAGNOSIS — L89511 Pressure ulcer of right ankle, stage 1: Secondary | ICD-10-CM

## 2017-09-12 NOTE — Patient Instructions (Signed)
Please keep skin clean and dry. Get some of the [place] pressure bandages from the pharmacy.  This will add extra cushion along with protection to the area. Keep a thin amount of neosporin over the area to help prevent infection.  The area should not be wet.  We will have [facility] take a look this Monday at their visit. Any worsening pain, redness or decreased ROM, please come see [name] of have assessment in the ER.

## 2017-09-12 NOTE — Progress Notes (Signed)
Patient presents to clinic today with his wife c/o sore of lateral R ankle over the past couple of weeks. States she has noted an area of redness and tenderness over the ankle with mild scab formation. She states the area is improving but is still present and tender. They deny fever, chills. Denies decreased ROM of foot/ankle. Denies injury. Patient does stay in chair or hospital-grade bed most of the time.   Past Medical History:  Diagnosis Date  . A-fib ([place])   . C. difficile diarrhea   . Cataracts, bilateral   . Chronic dermatitis   . Diabetes type 2, controlled ([place]) 2000  . History of chicken pox   . Mumps    Adult  . Retinopathy   . Shingles   . Stroke [facility]) Nov 2011  . Undescended testicle, unilateral    Right  . Whooping cough     Current Outpatient Medications on File Prior to Visit  Medication Sig Dispense Refill  . apixaban (ELIQUIS) 5 MG TABS tablet Take 1 tablet (5 mg total) by mouth 2 (two) times daily. 180 tablet 1  . Calcium Carbonate Antacid (ALKA-SELTZER ANTACID PO) Take 2 tablets by mouth daily as needed (for indegestion).     . Cranberry 200 MG CAPS Take 2 capsules 2 (two) times daily by mouth.    . cyanocobalamin (,VITAMIN B-12,) 1000 MCG/ML injection Inject 1,000 mcg into the muscle every 30 (thirty) days.    Marland Kitchen docusate sodium (COLACE) 100 MG capsule Take 200 mg by mouth daily as needed for mild constipation.     Marland Kitchen ELDERBERRY PO Take 1 tablet 2 (two) times daily by mouth.    Marland Kitchen glucose blood test strip One Touch Verio strips Use as instructed to check fasting sugars once daily .Dx:E11.9 100 each 12  . levothyroxine (SYNTHROID, LEVOTHROID) 50 MCG tablet Take 1 tablet (50 mcg total) by mouth daily. 90 tablet 1  . metFORMIN (GLUCOPHAGE-XR) 500 MG 24 hr tablet TAKE 2 TABLETS WITH BREAKFAST AND AT BEDTIME. Start 04/17/17. 360 tablet 1  . Miconazole Nitrate (TRIPLE PASTE AF) 2 % OINT Apply 1 application topically daily as needed (jock itch).     . Misc Natural  Products (FOCUSED MIND PO) Take 1 tablet 2 (two) times daily by mouth.    . Omega-3 Fatty Acids (FISH OIL) 1000 MG CAPS Take 1 capsule 2 (two) times daily by mouth.    . Probiotic Product (PROBIOTIC-10 PO) Take 3 capsules daily by mouth.    Marland Kitchen amiodarone (PACERONE) 200 MG tablet Take 1 tablet (200 mg total) by mouth 2 (two) times daily. Discontinue after 04/23/17 doses. (Patient not taking: Reported on 08/01/2017)    . diltiazem (CARDIZEM CD) 240 MG 24 hr capsule Take 1 capsule (240 mg total) by mouth daily. (Patient not taking: Reported on 09/08/2017) 90 capsule 1  . furosemide (LASIX) 20 MG tablet Take 1 tablet (20 mg total) by mouth daily. (Patient not taking: Reported on 08/01/2017) 30 tablet 5  . nystatin-triamcinolone ointment (MYCOLOG) Apply 1 application 2 (two) times daily topically. (Patient not taking: Reported on 09/12/2017) 30 g 0  . omeprazole (PRILOSEC) 20 MG capsule Take 20 mg by mouth daily.     No current facility-administered medications on file prior to visit.     Allergies  Allergen Reactions  . Flomax [Tamsulosin Hcl] Other (See Comments)    Hypotension  . Shellfish Allergy Anaphylaxis  . Aspirin-Dipyridamole Er Other (See Comments)    Headaches   . Other  Other (See Comments)    Blood Thinner given in Rehab gave H/As - Not Coumadin/Headaches      Family History  Problem Relation Age of Onset  . Pneumonia Mother 50       Deceased  . GI Bleed Father 75       Deceased - Ulcers  . Diabetes Cousin   . Diabetes Brother   . Cancer Paternal Aunt     Social History   Socioeconomic History  . Marital status: Married    Spouse name: None  . Number of children: None  . Years of education: None  . Highest education level: None  Social Needs  . Financial resource strain: None  . Food insecurity - worry: None  . Food insecurity - inability: None  . Transportation needs - medical: None  . Transportation needs - non-medical: None  Occupational History  . None    Tobacco Use  . Smoking status: Former Research scientist (life sciences)  . Smokeless tobacco: Never Used  . Tobacco comment: Hasn't smoked since age 57  Substance and Sexual Activity  . Alcohol use: No    Alcohol/week: 0.0 oz  . Drug use: No  . Sexual activity: No  Other Topics Concern  . None  Social History Narrative   Lives with wife in a one story home.  Has 1 child.  Retired Armed forces technical officer.  Education: Oceanographer.     Review of Systems - See HPI.  All other ROS are negative.  BP 121/60   Pulse 81   Temp 97.9 F (36.6 C) (Oral)   Resp 16   Ht '5\' 6"'  (1.676 m)   Wt 158 lb 6 oz (71.8 kg)   SpO2 98%   BMI 25.56 kg/m   Physical Exam  Constitutional: He is oriented to person, place, and time and well-developed, well-nourished, and in no distress.  HENT:  Head: Normocephalic and atraumatic.  Eyes: Conjunctivae are normal.  Neck: Neck supple.  Cardiovascular: Normal rate and regular rhythm.  Pulmonary/Chest: Effort normal.  Neurological: He is alert and oriented to person, place, and time.  Skin: Skin is warm and dry.     Vitals reviewed.   Recent Results (from the past 2160 hour(s))  CBC w/Diff     Status: Abnormal   Collection Time: 06/27/17  3:59 PM  Result Value Ref Range   WBC 13.2 (H) 3.8 - 10.8 Thousand/uL   RBC 4.02 (L) 4.20 - 5.80 Million/uL   Hemoglobin 11.9 (L) 13.2 - 17.1 g/dL   HCT 34.4 (L) 38.5 - 50.0 %   MCV 85.6 80.0 - 100.0 fL   MCH 29.6 27.0 - 33.0 pg   MCHC 34.6 32.0 - 36.0 g/dL   RDW 14.7 11.0 - 15.0 %   Platelets 334 140 - 400 Thousand/uL   MPV 12.3 7.5 - 12.5 fL   Neutro Abs 9,016 (H) 1,500 - 7,800 cells/uL   Lymphs Abs 2,600 850 - 3,900 cells/uL   WBC mixed population 845 200 - 950 cells/uL   Eosinophils Absolute 620 (H) 15 - 500 cells/uL   Basophils Absolute 119 0 - 200 cells/uL   Neutrophils Relative % 68.3 %   Total Lymphocyte 19.7 %   Monocytes Relative 6.4 %   Eosinophils Relative 4.7 %   Basophils Relative 0.9 %  Comp Met (CMET)     Status: Abnormal    Collection Time: 06/27/17  3:59 PM  Result Value Ref Range   Glucose, Bld 181 (H) 65 - 99 mg/dL  Comment: .            Fasting reference interval . For someone without known diabetes, a glucose value >125 mg/dL indicates that they may have diabetes and this should be confirmed with a follow-up test. .    BUN 13 7 - 25 mg/dL   Creat 1.17 (H) 0.70 - 1.11 mg/dL    Comment: For patients >69 years of age, the reference limit for Creatinine is approximately 13% higher for people identified as African-American. .    BUN/Creatinine Ratio 11 6 - 22 (calc)   Sodium 130 (L) 135 - 146 mmol/L   Potassium 5.4 (H) 3.5 - 5.3 mmol/L   Chloride 94 (L) 98 - 110 mmol/L   CO2 22 20 - 32 mmol/L   Calcium 9.6 8.6 - 10.3 mg/dL   Total Protein 7.1 6.1 - 8.1 g/dL   Albumin 4.0 3.6 - 5.1 g/dL   Globulin 3.1 1.9 - 3.7 g/dL (calc)   AG Ratio 1.3 1.0 - 2.5 (calc)   Total Bilirubin 0.3 0.2 - 1.2 mg/dL   Alkaline phosphatase (APISO) 66 40 - 115 U/L   AST 12 10 - 35 U/L   ALT 13 9 - 46 U/L  Hepatic function panel     Status: None   Collection Time: 07/21/17  2:55 PM  Result Value Ref Range   Total Bilirubin 0.4 0.2 - 1.2 mg/dL   Bilirubin, Direct 0.1 0.0 - 0.3 mg/dL   Alkaline Phosphatase 57 39 - 117 U/L   AST 16 0 - 37 U/L   ALT 13 0 - 53 U/L   Total Protein 7.1 6.0 - 8.3 g/dL   Albumin 4.1 3.5 - 5.2 g/dL  C-reactive protein     Status: Abnormal   Collection Time: 07/21/17  2:55 PM  Result Value Ref Range   CRP 0.2 (L) 0.5 - 20.0 mg/dL    Assessment/Plan: 1. Pressure injury of right ankle, stage 1 Trying to heal. Discussed use of pressure bandage pads to the area for protection. ROM within normal limits without significant tenderness. Afebrile. Do not see indication for imaging today. No evidence of cellulitis on exam. HH RN going to house Monday. Wife to have her reassess. Follow-up discussed. Return sooner if there are any worsening or new symptoms.   Leeanne Rio, PA-C

## 2017-09-14 DIAGNOSIS — Z87440 Personal history of urinary (tract) infections: Secondary | ICD-10-CM | POA: Diagnosis not present

## 2017-09-14 DIAGNOSIS — I110 Hypertensive heart disease with heart failure: Secondary | ICD-10-CM | POA: Diagnosis not present

## 2017-09-14 DIAGNOSIS — N401 Enlarged prostate with lower urinary tract symptoms: Secondary | ICD-10-CM | POA: Diagnosis not present

## 2017-09-14 DIAGNOSIS — E785 Hyperlipidemia, unspecified: Secondary | ICD-10-CM | POA: Diagnosis not present

## 2017-09-14 DIAGNOSIS — Z952 Presence of prosthetic heart valve: Secondary | ICD-10-CM | POA: Diagnosis not present

## 2017-09-14 DIAGNOSIS — H53462 Homonymous bilateral field defects, left side: Secondary | ICD-10-CM | POA: Diagnosis not present

## 2017-09-14 DIAGNOSIS — K219 Gastro-esophageal reflux disease without esophagitis: Secondary | ICD-10-CM | POA: Diagnosis not present

## 2017-09-14 DIAGNOSIS — Z7901 Long term (current) use of anticoagulants: Secondary | ICD-10-CM | POA: Diagnosis not present

## 2017-09-14 DIAGNOSIS — R2689 Other abnormalities of gait and mobility: Secondary | ICD-10-CM | POA: Diagnosis not present

## 2017-09-14 DIAGNOSIS — R339 Retention of urine, unspecified: Secondary | ICD-10-CM | POA: Diagnosis not present

## 2017-09-14 DIAGNOSIS — E039 Hypothyroidism, unspecified: Secondary | ICD-10-CM | POA: Diagnosis not present

## 2017-09-14 DIAGNOSIS — I69398 Other sequelae of cerebral infarction: Secondary | ICD-10-CM | POA: Diagnosis not present

## 2017-09-14 DIAGNOSIS — L89512 Pressure ulcer of right ankle, stage 2: Secondary | ICD-10-CM | POA: Diagnosis not present

## 2017-09-14 DIAGNOSIS — I69318 Other symptoms and signs involving cognitive functions following cerebral infarction: Secondary | ICD-10-CM | POA: Diagnosis not present

## 2017-09-14 DIAGNOSIS — F0150 Vascular dementia without behavioral disturbance: Secondary | ICD-10-CM | POA: Diagnosis not present

## 2017-09-14 DIAGNOSIS — Z7984 Long term (current) use of oral hypoglycemic drugs: Secondary | ICD-10-CM | POA: Diagnosis not present

## 2017-09-14 DIAGNOSIS — I5033 Acute on chronic diastolic (congestive) heart failure: Secondary | ICD-10-CM | POA: Diagnosis not present

## 2017-09-14 DIAGNOSIS — I480 Paroxysmal atrial fibrillation: Secondary | ICD-10-CM | POA: Diagnosis not present

## 2017-09-14 DIAGNOSIS — E119 Type 2 diabetes mellitus without complications: Secondary | ICD-10-CM | POA: Diagnosis not present

## 2017-09-15 ENCOUNTER — Encounter: Payer: Medicare Other | Admitting: Occupational Therapy

## 2017-09-15 ENCOUNTER — Ambulatory Visit: Payer: Medicare Other | Admitting: Physical Therapy

## 2017-09-18 ENCOUNTER — Encounter: Payer: Medicare Other | Admitting: Occupational Therapy

## 2017-09-18 ENCOUNTER — Ambulatory Visit: Payer: Medicare Other | Admitting: Physical Therapy

## 2017-09-19 DIAGNOSIS — R2689 Other abnormalities of gait and mobility: Secondary | ICD-10-CM | POA: Diagnosis not present

## 2017-09-19 DIAGNOSIS — I69398 Other sequelae of cerebral infarction: Secondary | ICD-10-CM | POA: Diagnosis not present

## 2017-09-19 DIAGNOSIS — H53462 Homonymous bilateral field defects, left side: Secondary | ICD-10-CM | POA: Diagnosis not present

## 2017-09-19 DIAGNOSIS — I69318 Other symptoms and signs involving cognitive functions following cerebral infarction: Secondary | ICD-10-CM | POA: Diagnosis not present

## 2017-09-19 DIAGNOSIS — E119 Type 2 diabetes mellitus without complications: Secondary | ICD-10-CM | POA: Diagnosis not present

## 2017-09-19 DIAGNOSIS — F0150 Vascular dementia without behavioral disturbance: Secondary | ICD-10-CM | POA: Diagnosis not present

## 2017-09-22 ENCOUNTER — Ambulatory Visit: Payer: Medicare Other | Admitting: Physical Therapy

## 2017-09-22 ENCOUNTER — Encounter: Payer: Medicare Other | Admitting: Occupational Therapy

## 2017-09-24 ENCOUNTER — Ambulatory Visit (INDEPENDENT_AMBULATORY_CARE_PROVIDER_SITE_OTHER): Payer: Medicare Other | Admitting: Physician Assistant

## 2017-09-24 ENCOUNTER — Other Ambulatory Visit: Payer: Self-pay

## 2017-09-24 ENCOUNTER — Encounter: Payer: Self-pay | Admitting: Physician Assistant

## 2017-09-24 VITALS — BP 118/60 | HR 67 | Resp 14 | Ht 66.0 in | Wt 155.0 lb

## 2017-09-24 DIAGNOSIS — I69318 Other symptoms and signs involving cognitive functions following cerebral infarction: Secondary | ICD-10-CM | POA: Diagnosis not present

## 2017-09-24 DIAGNOSIS — F0150 Vascular dementia without behavioral disturbance: Secondary | ICD-10-CM | POA: Diagnosis not present

## 2017-09-24 DIAGNOSIS — E1165 Type 2 diabetes mellitus with hyperglycemia: Secondary | ICD-10-CM | POA: Diagnosis not present

## 2017-09-24 DIAGNOSIS — R2689 Other abnormalities of gait and mobility: Secondary | ICD-10-CM | POA: Diagnosis not present

## 2017-09-24 DIAGNOSIS — I69398 Other sequelae of cerebral infarction: Secondary | ICD-10-CM | POA: Diagnosis not present

## 2017-09-24 DIAGNOSIS — H53462 Homonymous bilateral field defects, left side: Secondary | ICD-10-CM | POA: Diagnosis not present

## 2017-09-24 DIAGNOSIS — E119 Type 2 diabetes mellitus without complications: Secondary | ICD-10-CM | POA: Diagnosis not present

## 2017-09-24 LAB — COMPREHENSIVE METABOLIC PANEL
ALBUMIN: 4 g/dL (ref 3.5–5.2)
ALK PHOS: 50 U/L (ref 39–117)
ALT: 13 U/L (ref 0–53)
AST: 13 U/L (ref 0–37)
BUN: 28 mg/dL — AB (ref 6–23)
CHLORIDE: 99 meq/L (ref 96–112)
CO2: 21 mEq/L (ref 19–32)
Calcium: 9.6 mg/dL (ref 8.4–10.5)
Creatinine, Ser: 1.7 mg/dL — ABNORMAL HIGH (ref 0.40–1.50)
GFR: 40.27 mL/min — ABNORMAL LOW (ref >60.00)
Glucose, Bld: 119 mg/dL — ABNORMAL HIGH (ref 70–99)
Potassium: 5.2 mEq/L — ABNORMAL HIGH (ref 3.5–5.1)
SODIUM: 134 meq/L — AB (ref 135–145)
TOTAL PROTEIN: 6.8 g/dL (ref 6.0–8.3)
Total Bilirubin: 0.3 mg/dL (ref 0.2–1.2)

## 2017-09-24 LAB — HEMOGLOBIN A1C: HEMOGLOBIN A1C: 6.8 % — AB (ref 4.6–6.5)

## 2017-09-24 NOTE — Assessment & Plan Note (Signed)
Repeat labs today. Foot exam updated. [place] wound care sent to home.  Wife to schedule eye examination.  Will alter regimen accordingly.

## 2017-09-24 NOTE — Progress Notes (Signed)
History of Present Illness: Patient is a 84 y.o. male who presents to clinic today for follow-up of Diabetes Mellitus II, previously controlled2.  Patient currently on medication regimen of Metformin XR 1000 mg BID.  Is taking medications as directed.   Denies polyuria, polydipsia, polyphagia. Is not currently checking blood glucose daily as directed. Is taking every couple of days. Fasting sugars averaging 120-150.   Latest Maintenance: A1C --  Lab Results  Component Value Date   HGBA1C 7.0 (H) 04/16/2017   Diabetic Eye Exam -- Overdue. Wife to schedule appointment.  Foot Exam -- Due today.  Past Medical History:  Diagnosis Date  . A-fib ([place])   . C. difficile diarrhea   . Cataracts, bilateral   . Chronic dermatitis   . Diabetes type 2, controlled ([place]) 2000  . History of chicken pox   . Mumps    Adult  . Retinopathy   . Shingles   . Stroke [facility]) Nov 2011  . Undescended testicle, unilateral    Right  . Whooping cough     Current Outpatient Medications on File Prior to Visit  Medication Sig Dispense Refill  . amiodarone (PACERONE) 200 MG tablet Take 1 tablet (200 mg total) by mouth 2 (two) times daily. Discontinue after 04/23/17 doses.    Marland Kitchen apixaban (ELIQUIS) 5 MG TABS tablet Take 1 tablet (5 mg total) by mouth 2 (two) times daily. 180 tablet 1  . Calcium Carbonate Antacid (ALKA-SELTZER ANTACID PO) Take 2 tablets by mouth daily as needed (for indegestion).     . Cranberry 200 MG CAPS Take 2 capsules 2 (two) times daily by mouth.    . cyanocobalamin (,VITAMIN B-12,) 1000 MCG/ML injection Inject 1,000 mcg into the muscle every 30 (thirty) days.    Marland Kitchen diltiazem (CARDIZEM CD) 240 MG 24 hr capsule Take 1 capsule (240 mg total) by mouth daily. 90 capsule 1  . docusate sodium (COLACE) 100 MG capsule Take 200 mg by mouth daily as needed for mild constipation.     Marland Kitchen ELDERBERRY PO Take 1 tablet 2 (two) times daily by mouth.    . furosemide (LASIX) 20 MG tablet Take 1 tablet (20 mg  total) by mouth daily. 30 tablet 5  . glucose blood test strip One Touch Verio strips Use as instructed to check fasting sugars once daily .Dx:E11.9 100 each 12  . levothyroxine (SYNTHROID, LEVOTHROID) 50 MCG tablet Take 1 tablet (50 mcg total) by mouth daily. 90 tablet 1  . metFORMIN (GLUCOPHAGE-XR) 500 MG 24 hr tablet TAKE 2 TABLETS WITH BREAKFAST AND AT BEDTIME. Start 04/17/17. 360 tablet 1  . Miconazole Nitrate (TRIPLE PASTE AF) 2 % OINT Apply 1 application topically daily as needed (jock itch).     . Misc Natural Products (FOCUSED MIND PO) Take 1 tablet 2 (two) times daily by mouth.    . mupirocin ointment (BACTROBAN) 2 % Place 1 application into the nose 2 (two) times daily.    Marland Kitchen nystatin-triamcinolone ointment (MYCOLOG) Apply 1 application 2 (two) times daily topically. 30 g 0  . Omega-3 Fatty Acids (FISH OIL) 1000 MG CAPS Take 1 capsule 2 (two) times daily by mouth.    Marland Kitchen omeprazole (PRILOSEC) 20 MG capsule Take 20 mg by mouth daily.    . Probiotic Product (PROBIOTIC-10 PO) Take 3 capsules daily by mouth.     No current facility-administered medications on file prior to visit.     Allergies  Allergen Reactions  . Flomax [Tamsulosin Hcl] Other (See Comments)  Hypotension  . Shellfish Allergy Anaphylaxis  . Aspirin-Dipyridamole Er Other (See Comments)    Headaches   . Other Other (See Comments)    Blood Thinner given in Rehab gave H/As - Not Coumadin/Headaches      Family History  Problem Relation Age of Onset  . Pneumonia Mother 28       Deceased  . GI Bleed Father 67       Deceased - Ulcers  . Diabetes Cousin   . Diabetes Brother   . Cancer Paternal Aunt     Social History   Socioeconomic History  . Marital status: Married    Spouse name: None  . Number of children: None  . Years of education: None  . Highest education level: None  Social Needs  . Financial resource strain: None  . Food insecurity - worry: None  . Food insecurity - inability: None  .  Transportation needs - medical: None  . Transportation needs - non-medical: None  Occupational History  . None  Tobacco Use  . Smoking status: Former Research scientist (life sciences)  . Smokeless tobacco: Never Used  . Tobacco comment: Hasn't smoked since age 68  Substance and Sexual Activity  . Alcohol use: No    Alcohol/week: 0.0 oz  . Drug use: No  . Sexual activity: No  Other Topics Concern  . None  Social History Narrative   Lives with wife in a one story home.  Has 1 child.  Retired Armed forces technical officer.  Education: Oceanographer.     Review of Systems: Pertinent ROS are listed in HPI  Physical Examination: BP 118/60   Pulse 67   Resp 14   Ht 5\' 6"  (1.676 m)   Wt 155 lb (70.3 kg)   SpO2 97%   BMI 25.02 kg/m  General appearance: alert, cooperative, appears stated age and no distress Lungs: clear to auscultation bilaterally Heart: regular rate and rhythm, S1, S2 normal, no murmur, click, rub or gallop Extremities: no evidence of edema on examination. Right lateral malleolus with healing noted. Opening is now about 2 mm in diameter. Pulses: 2+ and symmetric Neurologic: Grossly normal  Diabetic Foot Exam - Simple   Simple Foot Form Diabetic Foot exam was performed with the following findings:  Yes 09/24/2017 11:45 AM  Visual Inspection See comments:  Yes Sensation Testing See comments:  Yes Pulse Check Posterior Tibialis and Dorsalis pulse intact bilaterally:  Yes Comments Xerosis of skin noted on exam. No maceration or evidence of tinea infection.  Sensation good overall. Slight decreased sensation over the ball of left foot.     Assessment/Plan: Diabetes mellitus type II, uncontrolled ([place]) Repeat labs today. Foot exam updated. [place] wound care sent to home.  Wife to schedule eye examination.  Will alter regimen accordingly.

## 2017-09-24 NOTE — Patient Instructions (Signed)
Please go to the lab today for blood work.  I will call you with your results. We will alter treatment regimen(s) if indicated by your results.   Wound care is coming to the home to help monitor healing area on the ankle.   Follow-up will be based on lab results.

## 2017-09-25 ENCOUNTER — Other Ambulatory Visit: Payer: Self-pay | Admitting: Physician Assistant

## 2017-09-25 ENCOUNTER — Ambulatory Visit: Payer: Medicare Other | Admitting: Physical Therapy

## 2017-09-25 ENCOUNTER — Encounter: Payer: Medicare Other | Admitting: Occupational Therapy

## 2017-09-25 DIAGNOSIS — R339 Retention of urine, unspecified: Secondary | ICD-10-CM | POA: Diagnosis not present

## 2017-09-25 DIAGNOSIS — R7989 Other specified abnormal findings of blood chemistry: Secondary | ICD-10-CM

## 2017-09-26 ENCOUNTER — Encounter: Payer: Self-pay | Admitting: Physician Assistant

## 2017-09-26 DIAGNOSIS — E119 Type 2 diabetes mellitus without complications: Secondary | ICD-10-CM | POA: Diagnosis not present

## 2017-09-26 DIAGNOSIS — H53462 Homonymous bilateral field defects, left side: Secondary | ICD-10-CM | POA: Diagnosis not present

## 2017-09-26 DIAGNOSIS — R2689 Other abnormalities of gait and mobility: Secondary | ICD-10-CM | POA: Diagnosis not present

## 2017-09-26 DIAGNOSIS — F0150 Vascular dementia without behavioral disturbance: Secondary | ICD-10-CM | POA: Diagnosis not present

## 2017-09-26 DIAGNOSIS — I69398 Other sequelae of cerebral infarction: Secondary | ICD-10-CM | POA: Diagnosis not present

## 2017-09-26 DIAGNOSIS — I69318 Other symptoms and signs involving cognitive functions following cerebral infarction: Secondary | ICD-10-CM | POA: Diagnosis not present

## 2017-09-29 ENCOUNTER — Ambulatory Visit: Payer: Medicare Other | Admitting: Neurology

## 2017-09-29 DIAGNOSIS — I4510 Unspecified right bundle-branch block: Secondary | ICD-10-CM | POA: Diagnosis not present

## 2017-09-29 DIAGNOSIS — Z8673 Personal history of transient ischemic attack (TIA), and cerebral infarction without residual deficits: Secondary | ICD-10-CM | POA: Diagnosis not present

## 2017-09-29 DIAGNOSIS — I480 Paroxysmal atrial fibrillation: Secondary | ICD-10-CM | POA: Diagnosis not present

## 2017-09-29 DIAGNOSIS — I69318 Other symptoms and signs involving cognitive functions following cerebral infarction: Secondary | ICD-10-CM | POA: Diagnosis not present

## 2017-09-29 DIAGNOSIS — R2689 Other abnormalities of gait and mobility: Secondary | ICD-10-CM | POA: Diagnosis not present

## 2017-09-29 DIAGNOSIS — Z7901 Long term (current) use of anticoagulants: Secondary | ICD-10-CM | POA: Diagnosis not present

## 2017-09-29 DIAGNOSIS — H53462 Homonymous bilateral field defects, left side: Secondary | ICD-10-CM | POA: Diagnosis not present

## 2017-09-29 DIAGNOSIS — E119 Type 2 diabetes mellitus without complications: Secondary | ICD-10-CM | POA: Diagnosis not present

## 2017-09-29 DIAGNOSIS — F0150 Vascular dementia without behavioral disturbance: Secondary | ICD-10-CM | POA: Diagnosis not present

## 2017-09-29 DIAGNOSIS — I69398 Other sequelae of cerebral infarction: Secondary | ICD-10-CM | POA: Diagnosis not present

## 2017-09-30 DIAGNOSIS — R2689 Other abnormalities of gait and mobility: Secondary | ICD-10-CM | POA: Diagnosis not present

## 2017-09-30 DIAGNOSIS — H53462 Homonymous bilateral field defects, left side: Secondary | ICD-10-CM | POA: Diagnosis not present

## 2017-09-30 DIAGNOSIS — E119 Type 2 diabetes mellitus without complications: Secondary | ICD-10-CM | POA: Diagnosis not present

## 2017-09-30 DIAGNOSIS — F0150 Vascular dementia without behavioral disturbance: Secondary | ICD-10-CM | POA: Diagnosis not present

## 2017-09-30 DIAGNOSIS — I69318 Other symptoms and signs involving cognitive functions following cerebral infarction: Secondary | ICD-10-CM | POA: Diagnosis not present

## 2017-09-30 DIAGNOSIS — I69398 Other sequelae of cerebral infarction: Secondary | ICD-10-CM | POA: Diagnosis not present

## 2017-09-30 DIAGNOSIS — I4510 Unspecified right bundle-branch block: Secondary | ICD-10-CM | POA: Diagnosis not present

## 2017-10-01 DIAGNOSIS — E119 Type 2 diabetes mellitus without complications: Secondary | ICD-10-CM | POA: Diagnosis not present

## 2017-10-01 DIAGNOSIS — R2689 Other abnormalities of gait and mobility: Secondary | ICD-10-CM | POA: Diagnosis not present

## 2017-10-01 DIAGNOSIS — I69318 Other symptoms and signs involving cognitive functions following cerebral infarction: Secondary | ICD-10-CM | POA: Diagnosis not present

## 2017-10-01 DIAGNOSIS — I69398 Other sequelae of cerebral infarction: Secondary | ICD-10-CM | POA: Diagnosis not present

## 2017-10-01 DIAGNOSIS — F0150 Vascular dementia without behavioral disturbance: Secondary | ICD-10-CM | POA: Diagnosis not present

## 2017-10-01 DIAGNOSIS — H53462 Homonymous bilateral field defects, left side: Secondary | ICD-10-CM | POA: Diagnosis not present

## 2017-10-02 ENCOUNTER — Telehealth: Payer: Self-pay | Admitting: Emergency Medicine

## 2017-10-02 DIAGNOSIS — H53462 Homonymous bilateral field defects, left side: Secondary | ICD-10-CM | POA: Diagnosis not present

## 2017-10-02 DIAGNOSIS — I69318 Other symptoms and signs involving cognitive functions following cerebral infarction: Secondary | ICD-10-CM | POA: Diagnosis not present

## 2017-10-02 DIAGNOSIS — E119 Type 2 diabetes mellitus without complications: Secondary | ICD-10-CM | POA: Diagnosis not present

## 2017-10-02 DIAGNOSIS — I69398 Other sequelae of cerebral infarction: Secondary | ICD-10-CM | POA: Diagnosis not present

## 2017-10-02 DIAGNOSIS — F0150 Vascular dementia without behavioral disturbance: Secondary | ICD-10-CM | POA: Diagnosis not present

## 2017-10-02 DIAGNOSIS — R2689 Other abnormalities of gait and mobility: Secondary | ICD-10-CM | POA: Diagnosis not present

## 2017-10-02 NOTE — Telephone Encounter (Signed)
Ok to give verbal order.

## 2017-10-02 NOTE — Telephone Encounter (Signed)
Verbal orders given to [facility] at [facility].

## 2017-10-02 NOTE — Telephone Encounter (Signed)
Copied from [place] [phone]. Topic: General - Other >> Oct 01, 2017  4:08 PM Aurelio Brash B wrote: Reason for CRM:  Anderson Malta, RN from [facility] would like   verbal order to dc  bactroban  ointment,   change wound care to secura protective ointment to his peri wound , anasept gel to the wound bed -change daily Jennifer's contact number is [phone]   She states she needs this asap,  as she has to order the supplies

## 2017-10-07 ENCOUNTER — Other Ambulatory Visit (INDEPENDENT_AMBULATORY_CARE_PROVIDER_SITE_OTHER): Payer: Medicare Other

## 2017-10-07 DIAGNOSIS — E119 Type 2 diabetes mellitus without complications: Secondary | ICD-10-CM | POA: Diagnosis not present

## 2017-10-07 DIAGNOSIS — F0150 Vascular dementia without behavioral disturbance: Secondary | ICD-10-CM | POA: Diagnosis not present

## 2017-10-07 DIAGNOSIS — R7989 Other specified abnormal findings of blood chemistry: Secondary | ICD-10-CM | POA: Diagnosis not present

## 2017-10-07 DIAGNOSIS — I69318 Other symptoms and signs involving cognitive functions following cerebral infarction: Secondary | ICD-10-CM | POA: Diagnosis not present

## 2017-10-07 DIAGNOSIS — I69398 Other sequelae of cerebral infarction: Secondary | ICD-10-CM | POA: Diagnosis not present

## 2017-10-07 DIAGNOSIS — H53462 Homonymous bilateral field defects, left side: Secondary | ICD-10-CM | POA: Diagnosis not present

## 2017-10-07 DIAGNOSIS — R2689 Other abnormalities of gait and mobility: Secondary | ICD-10-CM | POA: Diagnosis not present

## 2017-10-07 LAB — BASIC METABOLIC PANEL
BUN: 20 mg/dL (ref 6–23)
CALCIUM: 9.5 mg/dL (ref 8.4–10.5)
CO2: 25 mEq/L (ref 19–32)
Chloride: 97 mEq/L (ref 96–112)
Creatinine, Ser: 1.55 mg/dL — ABNORMAL HIGH (ref 0.40–1.50)
GFR: 44.8 mL/min — AB (ref >60.00)
Glucose, Bld: 233 mg/dL — ABNORMAL HIGH (ref 70–99)
Potassium: 4.6 mEq/L (ref 3.5–5.1)
SODIUM: 132 meq/L — AB (ref 135–145)

## 2017-10-08 DIAGNOSIS — E119 Type 2 diabetes mellitus without complications: Secondary | ICD-10-CM | POA: Diagnosis not present

## 2017-10-08 DIAGNOSIS — F0150 Vascular dementia without behavioral disturbance: Secondary | ICD-10-CM | POA: Diagnosis not present

## 2017-10-08 DIAGNOSIS — I69398 Other sequelae of cerebral infarction: Secondary | ICD-10-CM | POA: Diagnosis not present

## 2017-10-08 DIAGNOSIS — R2689 Other abnormalities of gait and mobility: Secondary | ICD-10-CM | POA: Diagnosis not present

## 2017-10-08 DIAGNOSIS — I69318 Other symptoms and signs involving cognitive functions following cerebral infarction: Secondary | ICD-10-CM | POA: Diagnosis not present

## 2017-10-08 DIAGNOSIS — H53462 Homonymous bilateral field defects, left side: Secondary | ICD-10-CM | POA: Diagnosis not present

## 2017-10-10 ENCOUNTER — Telehealth: Payer: Self-pay | Admitting: Physician Assistant

## 2017-10-10 ENCOUNTER — Other Ambulatory Visit (INDEPENDENT_AMBULATORY_CARE_PROVIDER_SITE_OTHER): Payer: Medicare Other

## 2017-10-10 DIAGNOSIS — I69398 Other sequelae of cerebral infarction: Secondary | ICD-10-CM | POA: Diagnosis not present

## 2017-10-10 DIAGNOSIS — E119 Type 2 diabetes mellitus without complications: Secondary | ICD-10-CM | POA: Diagnosis not present

## 2017-10-10 DIAGNOSIS — I69318 Other symptoms and signs involving cognitive functions following cerebral infarction: Secondary | ICD-10-CM | POA: Diagnosis not present

## 2017-10-10 DIAGNOSIS — R399 Unspecified symptoms and signs involving the genitourinary system: Secondary | ICD-10-CM

## 2017-10-10 DIAGNOSIS — F0150 Vascular dementia without behavioral disturbance: Secondary | ICD-10-CM | POA: Diagnosis not present

## 2017-10-10 DIAGNOSIS — R2689 Other abnormalities of gait and mobility: Secondary | ICD-10-CM | POA: Diagnosis not present

## 2017-10-10 DIAGNOSIS — H53462 Homonymous bilateral field defects, left side: Secondary | ICD-10-CM | POA: Diagnosis not present

## 2017-10-10 LAB — POC URINALSYSI DIPSTICK (AUTOMATED)
BILIRUBIN UA: NEGATIVE
Blood, UA: NEGATIVE
GLUCOSE UA: NEGATIVE
Ketones, UA: NEGATIVE
LEUKOCYTES UA: NEGATIVE
NITRITE UA: NEGATIVE
PH UA: 6 (ref 5.0–8.0)
Protein, UA: NEGATIVE
Spec Grav, UA: 1.015 (ref 1.010–1.025)
Urobilinogen, UA: 0.2 E.U./dL

## 2017-10-10 NOTE — Telephone Encounter (Signed)
She can drop off urine if she would like for assessment as long as he is doing ok. If he is acting confused or any significant pain he needs assessment.  Have they contacted his Urologist -- they may want to see him today.

## 2017-10-10 NOTE — Telephone Encounter (Signed)
Patient states that last week his urine bag was cloudy in the morning (after being in the bag overnight).  They had been giving him cranberry juice -   Now patient seems to be getting agitated, all his urine is now cloudy.   She is worried about the chances of him having a UTI.Marland Kitchen She has a urine sample and she was wondering if she could drop it off somewhere and have it tested to see if he need abx.   Wife is aware that I am routing this to PCP to see if just a urine sample is okay or if he needs an appointment.  She is aware that I will call her back as soon as I have an answer.

## 2017-10-10 NOTE — Telephone Encounter (Signed)
Copied from [place] [id number]. Topic: Quick Communication - See Telephone Encounter >> Oct 10, 2017  9:01 AM Percell Belt A wrote: CRM for notification. See Telephone encounter for: pt wife called in and said that she thanks pt has a uti and would like to know if she could bring him to just give a urine sample today?  Pt has an appt with [facility] on the 23rd.  Best number [phone]    10/10/17.

## 2017-10-10 NOTE — Telephone Encounter (Signed)
Patient's wife is going to drop off urine sample to [facility] location for testing.  She has not called the urologist because she said they will end up doing the same thing and it is easier for her to get to our offices rather than driving to [place].     She is aware that any pain/confusion/additional symptoms would need evaluation.  Orders have been placed for urine dip and culture.

## 2017-10-11 LAB — URINE CULTURE
MICRO NUMBER: 90078192
SPECIMEN QUALITY:: ADEQUATE

## 2017-10-14 DIAGNOSIS — H53462 Homonymous bilateral field defects, left side: Secondary | ICD-10-CM | POA: Diagnosis not present

## 2017-10-14 DIAGNOSIS — I69398 Other sequelae of cerebral infarction: Secondary | ICD-10-CM | POA: Diagnosis not present

## 2017-10-14 DIAGNOSIS — I69318 Other symptoms and signs involving cognitive functions following cerebral infarction: Secondary | ICD-10-CM | POA: Diagnosis not present

## 2017-10-14 DIAGNOSIS — R2689 Other abnormalities of gait and mobility: Secondary | ICD-10-CM | POA: Diagnosis not present

## 2017-10-14 DIAGNOSIS — E119 Type 2 diabetes mellitus without complications: Secondary | ICD-10-CM | POA: Diagnosis not present

## 2017-10-14 DIAGNOSIS — F0150 Vascular dementia without behavioral disturbance: Secondary | ICD-10-CM | POA: Diagnosis not present

## 2017-10-15 ENCOUNTER — Ambulatory Visit (INDEPENDENT_AMBULATORY_CARE_PROVIDER_SITE_OTHER): Payer: Medicare Other | Admitting: Physician Assistant

## 2017-10-15 ENCOUNTER — Encounter: Payer: Self-pay | Admitting: Physician Assistant

## 2017-10-15 ENCOUNTER — Other Ambulatory Visit: Payer: Self-pay

## 2017-10-15 VITALS — BP 120/60 | HR 78 | Temp 97.5°F | Resp 14 | Ht 66.0 in | Wt 158.0 lb

## 2017-10-15 DIAGNOSIS — N183 Chronic kidney disease, stage 3 unspecified: Secondary | ICD-10-CM

## 2017-10-15 DIAGNOSIS — Z7409 Other reduced mobility: Secondary | ICD-10-CM | POA: Diagnosis not present

## 2017-10-15 DIAGNOSIS — Z741 Need for assistance with personal care: Secondary | ICD-10-CM

## 2017-10-15 LAB — BASIC METABOLIC PANEL
BUN: 21 mg/dL (ref 6–23)
CALCIUM: 9.6 mg/dL (ref 8.4–10.5)
CO2: 23 mEq/L (ref 19–32)
CREATININE: 1.5 mg/dL (ref 0.40–1.50)
Chloride: 99 mEq/L (ref 96–112)
GFR: 46.53 mL/min — ABNORMAL LOW (ref >60.00)
Glucose, Bld: 232 mg/dL — ABNORMAL HIGH (ref 70–99)
Potassium: 4.7 mEq/L (ref 3.5–5.1)
Sodium: 134 mEq/L — ABNORMAL LOW (ref 135–145)

## 2017-10-15 NOTE — Progress Notes (Signed)
Patient presents to clinic today for a mobility evaluation. Patient with history of Type II DM with retinopathy, vascular dementia, atrial fibrillation and CVA. Is unable to ambulate safely in the home even with walker without assistance. The patient is unable to propel a regular weight wheelchair nor can his wife (primary caregiver) due to her own health issues. Patient is set up for handicap ramps at the home so they would be able to get him in and out of the home easily with a rolling mobility device.  Past Medical History:  Diagnosis Date  . A-fib ([place])   . C. difficile diarrhea   . Cataracts, bilateral   . Chronic dermatitis   . Diabetes type 2, controlled ([place]) 2000  . History of chicken pox   . Mumps    Adult  . Retinopathy   . Shingles   . Stroke [facility]) Nov 2011  . Undescended testicle, unilateral    Right  . Whooping cough     Current Outpatient Medications on File Prior to Visit  Medication Sig Dispense Refill  . apixaban (ELIQUIS) 5 MG TABS tablet Take 1 tablet (5 mg total) by mouth 2 (two) times daily. 180 tablet 1  . Calcium Carbonate Antacid (ALKA-SELTZER ANTACID PO) Take 2 tablets by mouth daily as needed (for indegestion).     . Cranberry 200 MG CAPS Take 2 capsules 2 (two) times daily by mouth.    . cyanocobalamin (,VITAMIN B-12,) 1000 MCG/ML injection Inject 1,000 mcg into the muscle every 30 (thirty) days.    Marland Kitchen diltiazem (CARDIZEM CD) 240 MG 24 hr capsule Take 1 capsule (240 mg total) by mouth daily. 90 capsule 1  . docusate sodium (COLACE) 100 MG capsule Take 200 mg by mouth daily as needed for mild constipation.     Marland Kitchen ELDERBERRY PO Take 1 tablet 2 (two) times daily by mouth.    . furosemide (LASIX) 20 MG tablet Take 1 tablet (20 mg total) by mouth daily. 30 tablet 5  . glucose blood test strip One Touch Verio strips Use as instructed to check fasting sugars once daily .Dx:E11.9 100 each 12  . levothyroxine (SYNTHROID, LEVOTHROID) 50 MCG tablet Take 1 tablet (50  mcg total) by mouth daily. 90 tablet 1  . metFORMIN (GLUCOPHAGE-XR) 500 MG 24 hr tablet TAKE 2 TABLETS WITH BREAKFAST AND AT BEDTIME. Start 04/17/17. 360 tablet 1  . Miconazole Nitrate (TRIPLE PASTE AF) 2 % OINT Apply 1 application topically daily as needed (jock itch).     . Misc Natural Products (FOCUSED MIND PO) Take 1 tablet 2 (two) times daily by mouth.    . mupirocin ointment (BACTROBAN) 2 % Place 1 application into the nose 2 (two) times daily.    Marland Kitchen nystatin-triamcinolone ointment (MYCOLOG) Apply 1 application 2 (two) times daily topically. 30 g 0  . Omega-3 Fatty Acids (FISH OIL) 1000 MG CAPS Take 1 capsule 2 (two) times daily by mouth.    Marland Kitchen omeprazole (PRILOSEC) 20 MG capsule Take 20 mg by mouth daily.    . Probiotic Product (PROBIOTIC-10 PO) Take 3 capsules daily by mouth.    Marland Kitchen amiodarone (PACERONE) 200 MG tablet Take 1 tablet (200 mg total) by mouth 2 (two) times daily. Discontinue after 04/23/17 doses. (Patient not taking: Reported on 10/15/2017)     No current facility-administered medications on file prior to visit.     Allergies  Allergen Reactions  . Flomax [Tamsulosin Hcl] Other (See Comments)    Hypotension  . Shellfish  Allergy Anaphylaxis  . Aspirin-Dipyridamole Er Other (See Comments)    Headaches   . Other Other (See Comments)    Blood Thinner given in Rehab gave H/As - Not Coumadin/Headaches      Family History  Problem Relation Age of Onset  . Pneumonia Mother 53       Deceased  . GI Bleed Father 38       Deceased - Ulcers  . Diabetes Cousin   . Diabetes Brother   . Cancer Paternal Aunt     Social History   Socioeconomic History  . Marital status: Married    Spouse name: None  . Number of children: None  . Years of education: None  . Highest education level: None  Social Needs  . Financial resource strain: None  . Food insecurity - worry: None  . Food insecurity - inability: None  . Transportation needs - medical: None  . Transportation needs -  non-medical: None  Occupational History  . None  Tobacco Use  . Smoking status: Former Research scientist (life sciences)  . Smokeless tobacco: Never Used  . Tobacco comment: Hasn't smoked since age 77  Substance and Sexual Activity  . Alcohol use: No    Alcohol/week: 0.0 oz  . Drug use: No  . Sexual activity: No  Other Topics Concern  . None  Social History Narrative   Lives with wife in a one story home.  Has 1 child.  Retired Armed forces technical officer.  Education: Oceanographer.     Review of Systems - See HPI.  All other ROS are negative.  BP 120/60   Pulse 78   Temp (!) 97.5 F (36.4 C) (Oral)   Resp 14   Ht 5' 6" (1.676 m)   Wt 158 lb (71.7 kg)   SpO2 98%   BMI 25.50 kg/m   Physical Exam  Constitutional: He is oriented to person, place, and time and well-developed, well-nourished, and in no distress.  HENT:  Head: Normocephalic and atraumatic.  Eyes: Conjunctivae are normal.  Neck: Neck supple.  Cardiovascular: Normal rate, regular rhythm, normal heart sounds and intact distal pulses.  Pulmonary/Chest: Effort normal.  Musculoskeletal:       Right knee: Normal.       Left knee: Normal.  Neurological: He is alert and oriented to person, place, and time. He displays weakness (bilateral upper and lower extremities. 3/5 ). He displays facial symmetry and normal stance. Gait abnormal.  Skin: Skin is warm and dry. No rash noted.  Psychiatric: Affect normal.  Vitals reviewed.   Recent Results (from the past 2160 hour(s))  Hepatic function panel     Status: None   Collection Time: 07/21/17  2:55 PM  Result Value Ref Range   Total Bilirubin 0.4 0.2 - 1.2 mg/dL   Bilirubin, Direct 0.1 0.0 - 0.3 mg/dL   Alkaline Phosphatase 57 39 - 117 U/L   AST 16 0 - 37 U/L   ALT 13 0 - 53 U/L   Total Protein 7.1 6.0 - 8.3 g/dL   Albumin 4.1 3.5 - 5.2 g/dL  C-reactive protein     Status: Abnormal   Collection Time: 07/21/17  2:55 PM  Result Value Ref Range   CRP 0.2 (L) 0.5 - 20.0 mg/dL  Comp Met (CMET)     Status:  Abnormal   Collection Time: 09/24/17 12:13 PM  Result Value Ref Range   Sodium 134 (L) 135 - 145 mEq/L   Potassium 5.2 (H) 3.5 - 5.1 mEq/L  Chloride 99 96 - 112 mEq/L   CO2 21 19 - 32 mEq/L   Glucose, Bld 119 (H) 70 - 99 mg/dL   BUN 28 (H) 6 - 23 mg/dL   Creatinine, Ser 1.70 (H) 0.40 - 1.50 mg/dL   Total Bilirubin 0.3 0.2 - 1.2 mg/dL   Alkaline Phosphatase 50 39 - 117 U/L   AST 13 0 - 37 U/L   ALT 13 0 - 53 U/L   Total Protein 6.8 6.0 - 8.3 g/dL   Albumin 4.0 3.5 - 5.2 g/dL   Calcium 9.6 8.4 - 10.5 mg/dL   GFR 40.27 (L) >60.00 mL/min  Hemoglobin A1c     Status: Abnormal   Collection Time: 09/24/17 12:13 PM  Result Value Ref Range   Hgb A1c MFr Bld 6.8 (H) 4.6 - 6.5 %    Comment: Glycemic Control Guidelines for People with Diabetes:Non Diabetic:  <6%Goal of Therapy: <7%Additional Action Suggested:  >8%   Basic metabolic panel     Status: Abnormal   Collection Time: 10/07/17 11:13 AM  Result Value Ref Range   Sodium 132 (L) 135 - 145 mEq/L   Potassium 4.6 3.5 - 5.1 mEq/L   Chloride 97 96 - 112 mEq/L   CO2 25 19 - 32 mEq/L   Glucose, Bld 233 (H) 70 - 99 mg/dL   BUN 20 6 - 23 mg/dL   Creatinine, Ser 1.55 (H) 0.40 - 1.50 mg/dL   Calcium 9.5 8.4 - 10.5 mg/dL   GFR 44.80 (L) >60.00 mL/min  Urine Culture     Status: None   Collection Time: 10/10/17 11:25 AM  Result Value Ref Range   MICRO NUMBER: [id number]    SPECIMEN QUALITY: ADEQUATE    Sample Source URINE    STATUS: FINAL    Result:      Three or more organisms present, each greater than 10,000 cu/mL. May represent normal flora contamination from external genitalia. No further testing is required.  POCT Urinalysis Dipstick (Automated)     Status: None   Collection Time: 10/10/17 11:37 AM  Result Value Ref Range   Color, UA yellow    Clarity, UA clear    Glucose, UA neg    Bilirubin, UA neg    Ketones, UA neg    Spec Grav, UA 1.015 1.010 - 1.025   Blood, UA neg    pH, UA 6.0 5.0 - 8.0   Protein, UA neg     Urobilinogen, UA 0.2 0.2 or 1.0 E.U./dL   Nitrite, UA neg    Leukocytes, UA Negative Negative  Basic metabolic panel     Status: Abnormal   Collection Time: 10/15/17  2:33 PM  Result Value Ref Range   Sodium 134 (L) 135 - 145 mEq/L   Potassium 4.7 3.5 - 5.1 mEq/L   Chloride 99 96 - 112 mEq/L   CO2 23 19 - 32 mEq/L   Glucose, Bld 232 (H) 70 - 99 mg/dL   BUN 21 6 - 23 mg/dL   Creatinine, Ser 1.50 0.40 - 1.50 mg/dL   Calcium 9.6 8.4 - 10.5 mg/dL   GFR 46.53 (L) >60.00 mL/min    Assessment/Plan: Assistance needed for mobility Patient has need of a wheelchair. Needs would not be met by a regular weight wheelchair as he cannot propel nor can his wife who helps him ambulate everywhere he goes. Unsafe for him to ambulate distances > 100 feet with walker even with assistance. I have received a copy of the PT   assessment. I have reviewed, along with my supervising MD, Dr. Birdie Riddle and agree with assessment. Will place order.    Kidney disease, chronic, stage III (GFR 30-59 ml/min) (HCC) Repeat BMP today. May need to further decrease metformin.  - Basic metabolic panel    Leeanne Rio, PA-C

## 2017-10-15 NOTE — Patient Instructions (Signed)
Please go to the lab today for blood work.  I will call you with your results. We will alter treatment regimen(s) if indicated by your results.   I will work on the orders and get everything sent in to [place].  We will schedule follow-up based on lab results.

## 2017-10-17 DIAGNOSIS — H53462 Homonymous bilateral field defects, left side: Secondary | ICD-10-CM | POA: Diagnosis not present

## 2017-10-17 DIAGNOSIS — R2689 Other abnormalities of gait and mobility: Secondary | ICD-10-CM | POA: Diagnosis not present

## 2017-10-17 DIAGNOSIS — I69318 Other symptoms and signs involving cognitive functions following cerebral infarction: Secondary | ICD-10-CM | POA: Diagnosis not present

## 2017-10-17 DIAGNOSIS — I69398 Other sequelae of cerebral infarction: Secondary | ICD-10-CM | POA: Diagnosis not present

## 2017-10-17 DIAGNOSIS — F0150 Vascular dementia without behavioral disturbance: Secondary | ICD-10-CM | POA: Diagnosis not present

## 2017-10-17 DIAGNOSIS — E119 Type 2 diabetes mellitus without complications: Secondary | ICD-10-CM | POA: Diagnosis not present

## 2017-10-21 ENCOUNTER — Other Ambulatory Visit: Payer: Self-pay | Admitting: Physician Assistant

## 2017-10-21 ENCOUNTER — Other Ambulatory Visit: Payer: Self-pay | Admitting: Emergency Medicine

## 2017-10-21 DIAGNOSIS — N289 Disorder of kidney and ureter, unspecified: Secondary | ICD-10-CM

## 2017-10-21 MED ORDER — METFORMIN HCL ER 500 MG PO TB24: ORAL_TABLET | ORAL | 1 refills | Status: DC

## 2017-10-22 DIAGNOSIS — R2689 Other abnormalities of gait and mobility: Secondary | ICD-10-CM | POA: Diagnosis not present

## 2017-10-22 DIAGNOSIS — H53462 Homonymous bilateral field defects, left side: Secondary | ICD-10-CM | POA: Diagnosis not present

## 2017-10-22 DIAGNOSIS — E119 Type 2 diabetes mellitus without complications: Secondary | ICD-10-CM | POA: Diagnosis not present

## 2017-10-22 DIAGNOSIS — H353132 Nonexudative age-related macular degeneration, bilateral, intermediate dry stage: Secondary | ICD-10-CM | POA: Diagnosis not present

## 2017-10-22 DIAGNOSIS — F0150 Vascular dementia without behavioral disturbance: Secondary | ICD-10-CM | POA: Diagnosis not present

## 2017-10-22 DIAGNOSIS — I69318 Other symptoms and signs involving cognitive functions following cerebral infarction: Secondary | ICD-10-CM | POA: Diagnosis not present

## 2017-10-22 DIAGNOSIS — I69398 Other sequelae of cerebral infarction: Secondary | ICD-10-CM | POA: Diagnosis not present

## 2017-10-22 LAB — HM DIABETES EYE EXAM

## 2017-10-23 ENCOUNTER — Encounter: Payer: Self-pay | Admitting: Physician Assistant

## 2017-10-27 DIAGNOSIS — R339 Retention of urine, unspecified: Secondary | ICD-10-CM | POA: Diagnosis not present

## 2017-10-29 DIAGNOSIS — I69318 Other symptoms and signs involving cognitive functions following cerebral infarction: Secondary | ICD-10-CM | POA: Diagnosis not present

## 2017-10-29 DIAGNOSIS — R2689 Other abnormalities of gait and mobility: Secondary | ICD-10-CM | POA: Diagnosis not present

## 2017-10-29 DIAGNOSIS — F0150 Vascular dementia without behavioral disturbance: Secondary | ICD-10-CM | POA: Diagnosis not present

## 2017-10-29 DIAGNOSIS — H53462 Homonymous bilateral field defects, left side: Secondary | ICD-10-CM | POA: Diagnosis not present

## 2017-10-29 DIAGNOSIS — E119 Type 2 diabetes mellitus without complications: Secondary | ICD-10-CM | POA: Diagnosis not present

## 2017-10-29 DIAGNOSIS — I69398 Other sequelae of cerebral infarction: Secondary | ICD-10-CM | POA: Diagnosis not present

## 2017-11-04 ENCOUNTER — Encounter: Payer: Self-pay | Admitting: Physician Assistant

## 2017-11-04 MED ORDER — LEVOTHYROXINE SODIUM 50 MCG PO TABS: 50.0000 ug | ORAL_TABLET | Freq: Every day | ORAL | 1 refills | Status: DC

## 2017-11-05 DIAGNOSIS — E119 Type 2 diabetes mellitus without complications: Secondary | ICD-10-CM | POA: Diagnosis not present

## 2017-11-05 DIAGNOSIS — R2689 Other abnormalities of gait and mobility: Secondary | ICD-10-CM | POA: Diagnosis not present

## 2017-11-05 DIAGNOSIS — I69398 Other sequelae of cerebral infarction: Secondary | ICD-10-CM | POA: Diagnosis not present

## 2017-11-05 DIAGNOSIS — F0150 Vascular dementia without behavioral disturbance: Secondary | ICD-10-CM | POA: Diagnosis not present

## 2017-11-05 DIAGNOSIS — I69318 Other symptoms and signs involving cognitive functions following cerebral infarction: Secondary | ICD-10-CM | POA: Diagnosis not present

## 2017-11-05 DIAGNOSIS — H53462 Homonymous bilateral field defects, left side: Secondary | ICD-10-CM | POA: Diagnosis not present

## 2017-11-10 ENCOUNTER — Ambulatory Visit (INDEPENDENT_AMBULATORY_CARE_PROVIDER_SITE_OTHER): Payer: Medicare Other | Admitting: Podiatry

## 2017-11-10 ENCOUNTER — Encounter: Payer: Self-pay | Admitting: Podiatry

## 2017-11-10 DIAGNOSIS — I739 Peripheral vascular disease, unspecified: Secondary | ICD-10-CM

## 2017-11-10 DIAGNOSIS — M79675 Pain in left toe(s): Secondary | ICD-10-CM

## 2017-11-10 DIAGNOSIS — B351 Tinea unguium: Secondary | ICD-10-CM | POA: Diagnosis not present

## 2017-11-10 DIAGNOSIS — M79674 Pain in right toe(s): Secondary | ICD-10-CM | POA: Diagnosis not present

## 2017-11-11 ENCOUNTER — Ambulatory Visit: Payer: Medicare Other | Admitting: Podiatry

## 2017-11-12 NOTE — Progress Notes (Signed)
Subjective: 84 y.o. returns the office today for painful, elongated, thickened toenails which he cannot trim himself. Denies any redness or drainage around the nails. Denies any open sores, blistering to his feet. Denies any acute changes since last appointment and no new complaints today. Denies any systemic complaints such as fevers, chills, nausea, vomiting.   Objective: presents with caregiver; in wheelchair DP/PT pulses palpable 1/4, CRT less than 3 seconds Nails hypertrophic, dystrophic, elongated, brittle, discolored 10. There is incurvation along the medial and lateral hallux toenails. There is tenderness overlying the nails 1-5 bilaterally. There is no surrounding erythema or drainage along the nail sites.  There is no area of skin breakdown, open sores, blistering to the left hallux or bilateral feet.  No other areas of tenderness bilateral lower extremities. No overlying edema, erythema, increased warmth. No pain with calf compression, swelling, warmth, erythema.  Assessment: Patient presents with symptomatic onychomycosis  Plan: -Treatment options including alternatives, risks, complications were discussed -Nails sharply debrided 10 without complication/bleeding. -Discussed daily foot inspection. If there are any changes, to call the office immediately.  -Follow-up in 3 months or sooner if any problems are to arise. In the meantime, encouraged to call the office with any questions, concerns, changes symptoms.  Celesta Gentile, DPM

## 2017-11-14 MED ORDER — LEVOTHYROXINE SODIUM 50 MCG PO TABS
50.0000 ug | ORAL_TABLET | Freq: Every day | ORAL | 1 refills | Status: AC
Start: 2017-11-14 — End: ?

## 2017-11-14 NOTE — Addendum Note (Signed)
Addended by: [name] on: 11/14/2017 10:10 PM   Modules accepted: Orders

## 2017-11-17 ENCOUNTER — Other Ambulatory Visit (INDEPENDENT_AMBULATORY_CARE_PROVIDER_SITE_OTHER): Payer: Medicare Other

## 2017-11-17 DIAGNOSIS — N289 Disorder of kidney and ureter, unspecified: Secondary | ICD-10-CM

## 2017-11-17 LAB — BASIC METABOLIC PANEL
BUN: 24 mg/dL — ABNORMAL HIGH (ref 6–23)
CALCIUM: 10.1 mg/dL (ref 8.4–10.5)
CO2: 22 mEq/L (ref 19–32)
CREATININE: 1.59 mg/dL — AB (ref 0.40–1.50)
Chloride: 99 mEq/L (ref 96–112)
GFR: 43.49 mL/min — ABNORMAL LOW (ref >60.00)
Glucose, Bld: 246 mg/dL — ABNORMAL HIGH (ref 70–99)
Potassium: 4.4 mEq/L (ref 3.5–5.1)
Sodium: 133 mEq/L — ABNORMAL LOW (ref 135–145)

## 2017-11-20 ENCOUNTER — Encounter: Payer: Self-pay | Admitting: Physician Assistant

## 2017-11-20 ENCOUNTER — Other Ambulatory Visit: Payer: Self-pay | Admitting: Physician Assistant

## 2017-11-20 DIAGNOSIS — N289 Disorder of kidney and ureter, unspecified: Secondary | ICD-10-CM

## 2017-11-24 ENCOUNTER — Other Ambulatory Visit: Payer: Self-pay | Admitting: Physician Assistant

## 2017-11-24 DIAGNOSIS — R339 Retention of urine, unspecified: Secondary | ICD-10-CM | POA: Diagnosis not present

## 2017-11-26 ENCOUNTER — Other Ambulatory Visit: Payer: Self-pay | Admitting: Physician Assistant

## 2017-11-26 ENCOUNTER — Encounter: Payer: Self-pay | Admitting: Physician Assistant

## 2017-11-26 ENCOUNTER — Ambulatory Visit (INDEPENDENT_AMBULATORY_CARE_PROVIDER_SITE_OTHER): Payer: Medicare Other | Admitting: Physician Assistant

## 2017-11-26 ENCOUNTER — Other Ambulatory Visit: Payer: Self-pay

## 2017-11-26 VITALS — BP 130/76 | HR 80 | Temp 97.6°F | Resp 14 | Ht 66.0 in | Wt 162.0 lb

## 2017-11-26 DIAGNOSIS — L989 Disorder of the skin and subcutaneous tissue, unspecified: Secondary | ICD-10-CM | POA: Diagnosis not present

## 2017-11-26 DIAGNOSIS — N189 Chronic kidney disease, unspecified: Secondary | ICD-10-CM

## 2017-11-26 LAB — BASIC METABOLIC PANEL
BUN: 18 mg/dL (ref 6–23)
CO2: 24 mEq/L (ref 19–32)
Calcium: 10.1 mg/dL (ref 8.4–10.5)
Chloride: 98 mEq/L (ref 96–112)
Creatinine, Ser: 1.64 mg/dL — ABNORMAL HIGH (ref 0.40–1.50)
GFR: 41.96 mL/min — AB (ref >60.00)
Glucose, Bld: 292 mg/dL — ABNORMAL HIGH (ref 70–99)
POTASSIUM: 4.5 meq/L (ref 3.5–5.1)
SODIUM: 131 meq/L — AB (ref 135–145)

## 2017-11-26 MED ORDER — GLUCOSE BLOOD VI STRP: ORAL_STRIP | 12 refills | Status: AC

## 2017-11-26 NOTE — Progress Notes (Signed)
Patient presents to clinic today for follow-up regarding renal function decline. Wife notes that patient is hydrating very well and has good urinary output in his catheter. Denies fever, chills, malaise or fatigue. Denies change in baseline mental status. They have held the lisinopril and the Metformin as directed. Glucose is averaging in the upper 100s fasting. They are working on limiting carbohydrate intake.   Patient also with a rough patch on his L hand present for 1 week that they would like examined. Denies pain or itching. He has been scratching at the area.   Past Medical History:  Diagnosis Date  . A-fib ([place])   . C. difficile diarrhea   . Cataracts, bilateral   . Chronic dermatitis   . Diabetes type 2, controlled ([place]) 2000  . History of chicken pox   . Mumps    Adult  . Retinopathy   . Shingles   . Stroke [facility]) Nov 2011  . Undescended testicle, unilateral    Right  . Whooping cough     Current Outpatient Medications on File Prior to Visit  Medication Sig Dispense Refill  . apixaban (ELIQUIS) 5 MG TABS tablet Take 1 tablet (5 mg total) by mouth 2 (two) times daily. 180 tablet 1  . Calcium Carbonate Antacid (ALKA-SELTZER ANTACID PO) Take 2 tablets by mouth daily as needed (for indegestion).     . Cranberry 200 MG CAPS Take 2 capsules 2 (two) times daily by mouth.    . cyanocobalamin (,VITAMIN B-12,) 1000 MCG/ML injection Inject 1,000 mcg into the muscle every 30 (thirty) days.    Marland Kitchen docusate sodium (COLACE) 100 MG capsule Take 200 mg by mouth daily as needed for mild constipation.     Marland Kitchen EASY TOUCH LANCETS 30G MISC USE 1 LANCETS TO CHECK BLOOD SUGAR  ONCE DAILY 100 each 12  . ELDERBERRY PO Take 1 tablet 2 (two) times daily by mouth.    Marland Kitchen ketoconazole (NIZORAL) 2 % shampoo     . levothyroxine (SYNTHROID, LEVOTHROID) 50 MCG tablet Take 1 tablet (50 mcg total) by mouth daily. 90 tablet 1  . Miconazole Nitrate (TRIPLE PASTE AF) 2 % OINT Apply 1 application topically daily as  needed (jock itch).     . Misc Natural Products (FOCUSED MIND PO) Take 1 tablet 2 (two) times daily by mouth.    . Omega-3 Fatty Acids (FISH OIL) 1000 MG CAPS Take 1 capsule 2 (two) times daily by mouth.    . Probiotic Product (PROBIOTIC-10 PO) Take 3 capsules daily by mouth.    Marland Kitchen amiodarone (PACERONE) 200 MG tablet Take 1 tablet (200 mg total) by mouth 2 (two) times daily. Discontinue after 04/23/17 doses. (Patient not taking: Reported on 10/15/2017)    . metFORMIN (GLUCOPHAGE-XR) 500 MG 24 hr tablet TAKE 1 TABLET BY MOUTH DAILY (Patient not taking: Reported on 11/26/2017) 360 tablet 1  . mupirocin ointment (BACTROBAN) 2 % Place 1 application into the nose 2 (two) times daily.     No current facility-administered medications on file prior to visit.     Allergies  Allergen Reactions  . Flomax [Tamsulosin Hcl] Other (See Comments)    Hypotension  . Shellfish Allergy Anaphylaxis  . Aspirin-Dipyridamole Er Other (See Comments)    Headaches   . Other Other (See Comments)    Blood Thinner given in Rehab gave H/As - Not Coumadin/Headaches      Family History  Problem Relation Age of Onset  . Pneumonia Mother 70  Deceased  . GI Bleed Father 14       Deceased - Ulcers  . Diabetes Cousin   . Diabetes Brother   . Cancer Paternal Aunt     Social History   Socioeconomic History  . Marital status: Married    Spouse name: None  . Number of children: None  . Years of education: None  . Highest education level: None  Social Needs  . Financial resource strain: None  . Food insecurity - worry: None  . Food insecurity - inability: None  . Transportation needs - medical: None  . Transportation needs - non-medical: None  Occupational History  . None  Tobacco Use  . Smoking status: Former Research scientist (life sciences)  . Smokeless tobacco: Never Used  . Tobacco comment: Hasn't smoked since age 71  Substance and Sexual Activity  . Alcohol use: No    Alcohol/week: 0.0 oz  . Drug use: No  . Sexual  activity: No  Other Topics Concern  . None  Social History Narrative   Lives with wife in a one story home.  Has 1 child.  Retired Armed forces technical officer.  Education: Oceanographer.     Review of Systems - See HPI.  All other ROS are negative.  BP 130/76   Pulse 80   Temp 97.6 F (36.4 C) (Oral)   Resp 14   Ht '5\' 6"'  (1.676 m)   Wt 162 lb (73.5 kg)   SpO2 99%   BMI 26.15 kg/m   Physical Exam  Constitutional: He is well-developed, well-nourished, and in no distress.  HENT:  Head: Normocephalic and atraumatic.  Eyes: Conjunctivae are normal.  Neck: Neck supple.  Cardiovascular: Normal rate, regular rhythm, normal heart sounds and intact distal pulses.  Pulmonary/Chest: Effort normal and breath sounds normal. No respiratory distress. He has no wheezes. He has no rales. He exhibits no tenderness.  Neurological: He is alert.  Oriented to person and place  Skin: Skin is warm and dry. No rash noted.     Psychiatric: Affect normal.  Vitals reviewed.   Recent Results (from the past 2160 hour(s))  Comp Met (CMET)     Status: Abnormal   Collection Time: 09/24/17 12:13 PM  Result Value Ref Range   Sodium 134 (L) 135 - 145 mEq/L   Potassium 5.2 (H) 3.5 - 5.1 mEq/L   Chloride 99 96 - 112 mEq/L   CO2 21 19 - 32 mEq/L   Glucose, Bld 119 (H) 70 - 99 mg/dL   BUN 28 (H) 6 - 23 mg/dL   Creatinine, Ser 1.70 (H) 0.40 - 1.50 mg/dL   Total Bilirubin 0.3 0.2 - 1.2 mg/dL   Alkaline Phosphatase 50 39 - 117 U/L   AST 13 0 - 37 U/L   ALT 13 0 - 53 U/L   Total Protein 6.8 6.0 - 8.3 g/dL   Albumin 4.0 3.5 - 5.2 g/dL   Calcium 9.6 8.4 - 10.5 mg/dL   GFR 40.27 (L) >60.00 mL/min  Hemoglobin A1c     Status: Abnormal   Collection Time: 09/24/17 12:13 PM  Result Value Ref Range   Hgb A1c MFr Bld 6.8 (H) 4.6 - 6.5 %    Comment: Glycemic Control Guidelines for People with Diabetes:Non Diabetic:  <6%Goal of Therapy: <7%Additional Action Suggested:  >8%   Basic metabolic panel     Status: Abnormal   Collection  Time: 10/07/17 11:13 AM  Result Value Ref Range   Sodium 132 (L) 135 - 145 mEq/L  Potassium 4.6 3.5 - 5.1 mEq/L   Chloride 97 96 - 112 mEq/L   CO2 25 19 - 32 mEq/L   Glucose, Bld 233 (H) 70 - 99 mg/dL   BUN 20 6 - 23 mg/dL   Creatinine, Ser 1.55 (H) 0.40 - 1.50 mg/dL   Calcium 9.5 8.4 - 10.5 mg/dL   GFR 44.80 (L) >60.00 mL/min  Urine Culture     Status: None   Collection Time: 10/10/17 11:25 AM  Result Value Ref Range   MICRO NUMBER: [id number]    SPECIMEN QUALITY: ADEQUATE    Sample Source URINE    STATUS: FINAL    Result:      Three or more organisms present, each greater than 10,000 cu/mL. May represent normal flora contamination from external genitalia. No further testing is required.  POCT Urinalysis Dipstick (Automated)     Status: None   Collection Time: 10/10/17 11:37 AM  Result Value Ref Range   Color, UA yellow    Clarity, UA clear    Glucose, UA neg    Bilirubin, UA neg    Ketones, UA neg    Spec Grav, UA 1.015 1.010 - 1.025   Blood, UA neg    pH, UA 6.0 5.0 - 8.0   Protein, UA neg    Urobilinogen, UA 0.2 0.2 or 1.0 E.U./dL   Nitrite, UA neg    Leukocytes, UA Negative Negative  Basic metabolic panel     Status: Abnormal   Collection Time: 10/15/17  2:33 PM  Result Value Ref Range   Sodium 134 (L) 135 - 145 mEq/L   Potassium 4.7 3.5 - 5.1 mEq/L   Chloride 99 96 - 112 mEq/L   CO2 23 19 - 32 mEq/L   Glucose, Bld 232 (H) 70 - 99 mg/dL   BUN 21 6 - 23 mg/dL   Creatinine, Ser 1.50 0.40 - 1.50 mg/dL   Calcium 9.6 8.4 - 10.5 mg/dL   GFR 46.53 (L) >60.00 mL/min  HM DIABETES EYE EXAM     Status: None   Collection Time: 10/22/17 12:00 AM  Result Value Ref Range   HM Diabetic Eye Exam No Retinopathy No Retinopathy  Basic metabolic panel     Status: Abnormal   Collection Time: 11/17/17  1:10 PM  Result Value Ref Range   Sodium 133 (L) 135 - 145 mEq/L   Potassium 4.4 3.5 - 5.1 mEq/L   Chloride 99 96 - 112 mEq/L   CO2 22 19 - 32 mEq/L   Glucose, Bld 246 (H) 70 -  99 mg/dL   BUN 24 (H) 6 - 23 mg/dL   Creatinine, Ser 1.59 (H) 0.40 - 1.50 mg/dL   Calcium 10.1 8.4 - 10.5 mg/dL   GFR 43.49 (L) >60.00 mL/min    Assessment/Plan: 1. Chronic kidney disease, unspecified CKD stage Will recheck renal function after holding medications. Nephrology appt pending. Carb modified diet.  - Basic metabolic panel  2. Skin lesion Seems related to scratching the skin. Supportive measures reviewed. Avoid irritating the area. If not healing in a couple of weeks, will need Dermatoscopy.    Leeanne Rio, PA-C

## 2017-11-26 NOTE — Patient Instructions (Signed)
Please go to the lab today for blood work.  I will call you with your results. We will alter treatment regimen(s) if indicated by your results.   Please keep hydrated.  Limit the cranberry juice. Continue the cranberry supplement.  We need to limit carbohydrate intake since we are holding off on the metformin. Limit those sweets!  Keep a close check on sugar levels and BP.  For the area on the hand, I am hopeful this is from scratching. Keep skin clean and dry. Apply a small amount of vaseline to help moisturize and protect the area. Cover with a band aid to make it harder for [place] to pick at. If not improving over the next 1-2 weeks, I want a Dermatology assessment.

## 2017-11-27 ENCOUNTER — Encounter: Payer: Self-pay | Admitting: Physician Assistant

## 2017-11-27 ENCOUNTER — Telehealth: Payer: Self-pay | Admitting: Physician Assistant

## 2017-11-27 ENCOUNTER — Encounter: Payer: Self-pay | Admitting: Emergency Medicine

## 2017-11-27 NOTE — Telephone Encounter (Signed)
Sent a My chart message to patient spouse

## 2017-11-27 NOTE — Telephone Encounter (Signed)
Insurance denies the Eliquis. I want them to contact their Cardiologist to discuss other options or to see if samples can be supplied.

## 2017-12-03 ENCOUNTER — Other Ambulatory Visit: Payer: Self-pay | Admitting: Emergency Medicine

## 2017-12-03 DIAGNOSIS — E118 Type 2 diabetes mellitus with unspecified complications: Secondary | ICD-10-CM

## 2017-12-03 MED ORDER — EASY TOUCH LANCETS 30G MISC: 12 refills | Status: AC

## 2017-12-08 ENCOUNTER — Encounter: Payer: Self-pay | Admitting: Physician Assistant

## 2017-12-09 ENCOUNTER — Other Ambulatory Visit: Payer: Self-pay | Admitting: Physician Assistant

## 2017-12-09 MED ORDER — GLIPIZIDE 5 MG PO TABS: 2.5000 mg | ORAL_TABLET | Freq: Every day | ORAL | 0 refills | Status: DC

## 2017-12-17 ENCOUNTER — Telehealth: Payer: Self-pay | Admitting: Neurology

## 2017-12-17 ENCOUNTER — Encounter: Payer: Self-pay | Admitting: Physician Assistant

## 2017-12-17 ENCOUNTER — Telehealth: Payer: Self-pay | Admitting: Physician Assistant

## 2017-12-17 NOTE — Telephone Encounter (Signed)
Letter has been written, printed and signed. Ready for pick up. Is in the file cabinet at the front.

## 2017-12-17 NOTE — Telephone Encounter (Signed)
Wife is aware. She is coming by this afternoon to pick up the letter.

## 2017-12-17 NOTE — Telephone Encounter (Signed)
Dr Nestor Ramp wants a letter stating Pt needs assistance with financial decisions to give to bank to allow them to with draw funds.

## 2017-12-17 NOTE — Telephone Encounter (Signed)
Copied from [place]. Topic: Inquiry >> Dec 17, 2017 10:10 AM Margot Ables wrote: Reason for CRM: Elease Etienne is needing a note/letter stating that patient has vascular dementia, hx of stroke, and blind in one eye. Pt is no longer able to make decisions for himself regarding personal or medical finances and care. They are trying to get some issues squared away and were told they need physician certification of above before they are able to proceed. She states this is an urgent matter to handle finances and healthcare POA. Please advise. She would like to come in a pick up letter asap and get her labs done when there.

## 2017-12-17 NOTE — Telephone Encounter (Signed)
They need a letter stating that patient needs help with his money so they can withdraw from his account   Please call [phone]

## 2017-12-18 NOTE — Telephone Encounter (Signed)
Called and spoke with Wife, Elease Etienne. She wanted you to reconsider, [facility]) provided them with a letter yesterday. She needs letters from 2 separate physicians

## 2017-12-18 NOTE — Telephone Encounter (Signed)
His POA should be able to withdraw funds.  This is something that needs to be discussed with a Education officer, museum but I don't typically provide letters addressing this

## 2017-12-19 NOTE — Telephone Encounter (Signed)
Okay.  We can provide a letter.

## 2017-12-29 DIAGNOSIS — R944 Abnormal results of kidney function studies: Secondary | ICD-10-CM | POA: Diagnosis not present

## 2017-12-29 DIAGNOSIS — R339 Retention of urine, unspecified: Secondary | ICD-10-CM | POA: Diagnosis not present

## 2018-01-06 ENCOUNTER — Encounter: Payer: Self-pay | Admitting: Physician Assistant

## 2018-01-07 MED ORDER — GLIPIZIDE 5 MG PO TABS: 2.5000 mg | ORAL_TABLET | Freq: Every day | ORAL | 1 refills | Status: AC

## 2018-01-13 DIAGNOSIS — R944 Abnormal results of kidney function studies: Secondary | ICD-10-CM | POA: Diagnosis not present

## 2018-01-14 ENCOUNTER — Encounter: Payer: Self-pay | Admitting: Physician Assistant

## 2018-01-26 DIAGNOSIS — R339 Retention of urine, unspecified: Secondary | ICD-10-CM | POA: Diagnosis not present

## 2018-02-02 DIAGNOSIS — E119 Type 2 diabetes mellitus without complications: Secondary | ICD-10-CM | POA: Diagnosis not present

## 2018-02-02 DIAGNOSIS — I480 Paroxysmal atrial fibrillation: Secondary | ICD-10-CM | POA: Diagnosis not present

## 2018-02-02 DIAGNOSIS — I491 Atrial premature depolarization: Secondary | ICD-10-CM | POA: Diagnosis not present

## 2018-02-02 DIAGNOSIS — I10 Essential (primary) hypertension: Secondary | ICD-10-CM | POA: Diagnosis not present

## 2018-02-02 DIAGNOSIS — F0390 Unspecified dementia without behavioral disturbance: Secondary | ICD-10-CM | POA: Diagnosis not present

## 2018-02-02 DIAGNOSIS — Z8673 Personal history of transient ischemic attack (TIA), and cerebral infarction without residual deficits: Secondary | ICD-10-CM | POA: Diagnosis not present

## 2018-02-02 DIAGNOSIS — R296 Repeated falls: Secondary | ICD-10-CM | POA: Diagnosis not present

## 2018-02-02 DIAGNOSIS — N400 Enlarged prostate without lower urinary tract symptoms: Secondary | ICD-10-CM | POA: Diagnosis not present

## 2018-02-02 DIAGNOSIS — I4510 Unspecified right bundle-branch block: Secondary | ICD-10-CM | POA: Diagnosis not present

## 2018-02-09 ENCOUNTER — Encounter: Payer: Self-pay | Admitting: Podiatry

## 2018-02-09 ENCOUNTER — Ambulatory Visit (INDEPENDENT_AMBULATORY_CARE_PROVIDER_SITE_OTHER): Payer: Medicare Other | Admitting: Podiatry

## 2018-02-09 DIAGNOSIS — M79674 Pain in right toe(s): Secondary | ICD-10-CM | POA: Diagnosis not present

## 2018-02-09 DIAGNOSIS — I739 Peripheral vascular disease, unspecified: Secondary | ICD-10-CM

## 2018-02-09 DIAGNOSIS — B351 Tinea unguium: Secondary | ICD-10-CM | POA: Diagnosis not present

## 2018-02-09 DIAGNOSIS — M79675 Pain in left toe(s): Secondary | ICD-10-CM | POA: Diagnosis not present

## 2018-02-10 NOTE — Progress Notes (Signed)
Subjective: 84 y.o. returns the office today for painful, elongated, thickened toenails which he cannot trim himself. Denies any redness or drainage around the nails.  He has had some buildup in between his toes and had a smell coming from the area has been using powder between his toes which is been helping.  Denies any acute changes since last appointment and no new complaints today. Denies any systemic complaints such as fevers, chills, nausea, vomiting.   Objective: Presents with caregiver; in wheelchair DP/PT pulses palpable 1/4, CRT less than 3 seconds Nails hypertrophic, dystrophic, elongated, brittle, discolored 10. There is incurvation along the medial and lateral hallux toenails. There is tenderness overlying the nails 1-5 bilaterally. There is no surrounding erythema or drainage along the nail sites.  There was some skin buildup between his toes mostly to the third and fourth and fourth and fifth toes in the right foot but upon debridement there is no underlying ulceration. There is no area of skin breakdown, open sores, blistering to the left hallux or bilateral feet.  No other areas of tenderness bilateral lower extremities. No overlying edema, erythema, increased warmth. No pain with calf compression, swelling, warmth, erythema.  Assessment: Patient presents with symptomatic onychomycosis  Plan: -Treatment options including alternatives, risks, complications were discussed -Nails sharply debrided 10 without complication/bleeding. -Recommended clean and dry thoroughly between the toes.  Continue the powders for shoes as well. -Discussed daily foot inspection. If there are any changes, to call the office immediately.  -Follow-up in 3 months or sooner if any problems are to arise. In the meantime, encouraged to call the office with any questions, concerns, changes symptoms.  Celesta Gentile, DPM

## 2018-02-11 ENCOUNTER — Ambulatory Visit: Payer: Medicare Other | Admitting: Neurology

## 2018-02-13 DIAGNOSIS — D631 Anemia in chronic kidney disease: Secondary | ICD-10-CM | POA: Diagnosis not present

## 2018-02-13 DIAGNOSIS — N2581 Secondary hyperparathyroidism of renal origin: Secondary | ICD-10-CM | POA: Diagnosis not present

## 2018-02-13 DIAGNOSIS — N183 Chronic kidney disease, stage 3 (moderate): Secondary | ICD-10-CM | POA: Diagnosis not present

## 2018-02-13 DIAGNOSIS — E1122 Type 2 diabetes mellitus with diabetic chronic kidney disease: Secondary | ICD-10-CM | POA: Diagnosis not present

## 2018-02-24 DIAGNOSIS — R339 Retention of urine, unspecified: Secondary | ICD-10-CM | POA: Diagnosis not present

## 2018-03-09 ENCOUNTER — Ambulatory Visit: Payer: Medicare Other | Admitting: Neurology

## 2018-03-10 ENCOUNTER — Encounter: Payer: Self-pay | Admitting: Physician Assistant

## 2018-03-11 MED ORDER — APIXABAN 2.5 MG PO TABS: 2.5000 mg | ORAL_TABLET | Freq: Two times a day (BID) | ORAL | 0 refills | Status: AC

## 2018-03-12 ENCOUNTER — Encounter: Payer: Self-pay | Admitting: Physician Assistant

## 2018-03-12 ENCOUNTER — Ambulatory Visit (INDEPENDENT_AMBULATORY_CARE_PROVIDER_SITE_OTHER): Payer: Medicare Other | Admitting: Physician Assistant

## 2018-03-12 ENCOUNTER — Other Ambulatory Visit: Payer: Self-pay

## 2018-03-12 VITALS — BP 120/82 | HR 71 | Temp 97.4°F | Resp 15 | Ht 66.0 in | Wt 163.2 lb

## 2018-03-12 DIAGNOSIS — N183 Chronic kidney disease, stage 3 unspecified: Secondary | ICD-10-CM

## 2018-03-12 DIAGNOSIS — H6121 Impacted cerumen, right ear: Secondary | ICD-10-CM | POA: Diagnosis not present

## 2018-03-12 DIAGNOSIS — E1165 Type 2 diabetes mellitus with hyperglycemia: Secondary | ICD-10-CM | POA: Diagnosis not present

## 2018-03-12 DIAGNOSIS — I10 Essential (primary) hypertension: Secondary | ICD-10-CM | POA: Diagnosis not present

## 2018-03-12 LAB — CBC WITH DIFFERENTIAL/PLATELET
BASOS ABS: 0.1 10*3/uL (ref 0.0–0.1)
Basophils Relative: 1 % (ref 0.0–3.0)
EOS PCT: 8.2 % — AB (ref 0.0–5.0)
Eosinophils Absolute: 0.8 10*3/uL — ABNORMAL HIGH (ref 0.0–0.7)
HCT: 34.1 % — ABNORMAL LOW (ref 39.0–52.0)
Hemoglobin: 11.6 g/dL — ABNORMAL LOW (ref 13.0–17.0)
Lymphocytes Relative: 23.2 % (ref 12.0–46.0)
Lymphs Abs: 2.2 10*3/uL (ref 0.7–4.0)
MCHC: 34.1 g/dL (ref 30.0–36.0)
MCV: 85.6 fl (ref 78.0–100.0)
Monocytes Absolute: 0.8 10*3/uL (ref 0.1–1.0)
Monocytes Relative: 8.5 % (ref 3.0–12.0)
NEUTROS ABS: 5.7 10*3/uL (ref 1.4–7.7)
Neutrophils Relative %: 59.1 % (ref 43.0–77.0)
Platelets: 245 10*3/uL (ref 150.0–400.0)
RBC: 3.98 Mil/uL — AB (ref 4.22–5.81)
RDW: 14.3 % (ref 11.5–15.5)
WBC: 9.6 10*3/uL (ref 4.0–10.5)

## 2018-03-12 LAB — COMPREHENSIVE METABOLIC PANEL
ALT: 10 U/L (ref 0–53)
AST: 11 U/L (ref 0–37)
Albumin: 4 g/dL (ref 3.5–5.2)
Alkaline Phosphatase: 62 U/L (ref 39–117)
BUN: 30 mg/dL — ABNORMAL HIGH (ref 6–23)
CALCIUM: 9.6 mg/dL (ref 8.4–10.5)
CHLORIDE: 101 meq/L (ref 96–112)
CO2: 23 meq/L (ref 19–32)
Creatinine, Ser: 1.74 mg/dL — ABNORMAL HIGH (ref 0.40–1.50)
GFR: 39.17 mL/min — AB (ref >60.00)
Glucose, Bld: 206 mg/dL — ABNORMAL HIGH (ref 70–99)
Potassium: 4.5 mEq/L (ref 3.5–5.1)
Sodium: 133 mEq/L — ABNORMAL LOW (ref 135–145)
Total Bilirubin: 0.4 mg/dL (ref 0.2–1.2)
Total Protein: 7.1 g/dL (ref 6.0–8.3)

## 2018-03-12 LAB — VITAMIN D 25 HYDROXY (VIT D DEFICIENCY, FRACTURES): VITD: 64.36 ng/mL (ref 30.00–100.00)

## 2018-03-12 LAB — LIPID PANEL
CHOL/HDL RATIO: 5
Cholesterol: 186 mg/dL (ref 0–200)
HDL: 35.9 mg/dL — AB (ref >39.00)
LDL Cholesterol: 118 mg/dL — ABNORMAL HIGH (ref 0–99)
NONHDL: 149.96
Triglycerides: 159 mg/dL — ABNORMAL HIGH (ref 0.0–149.0)
VLDL: 31.8 mg/dL (ref 0.0–40.0)

## 2018-03-12 LAB — HEMOGLOBIN A1C: Hgb A1c MFr Bld: 7.9 % — ABNORMAL HIGH (ref 4.6–6.5)

## 2018-03-12 LAB — VITAMIN B12: VITAMIN B 12: 1373 pg/mL — AB (ref 211–911)

## 2018-03-12 NOTE — Patient Instructions (Signed)
Please go to the lab today for blood work.  I will call you with your results. We will alter treatment regimen(s) if indicated by your results.    

## 2018-03-12 NOTE — Progress Notes (Signed)
Patient presents to clinic today for follow-up of diabetes mellitus II. Patient currently on a regimen of Glipizide 2.5 mg once daily. Is taking as directed. Note sugars had been averaging < 150 fasting but were increased last couple of weeks while he was fighting a respiratory infection. Is feeling much better know. They are trying to watch diet. He is staying well hydrated. Patient is followed by Nephrology for CKD Stage III. Up-to-date on eye and foot examinations.   Is also noting decreased hearing despite use of hearing aids. Otherwise asymptomatic. Wife is concerned for wax impaction.  Past Medical History:  Diagnosis Date  . A-fib ([place])   . C. difficile diarrhea   . Cataracts, bilateral   . Chronic dermatitis   . Diabetes type 2, controlled ([place]) 2000  . History of chicken pox   . Mumps    Adult  . Retinopathy   . Shingles   . Stroke [facility]) Nov 2011  . Undescended testicle, unilateral    Right  . Whooping cough     Current Outpatient Medications on File Prior to Visit  Medication Sig Dispense Refill  . apixaban (ELIQUIS) 2.5 MG TABS tablet Take 1 tablet (2.5 mg total) by mouth 2 (two) times daily. 10 tablet 0  . Calcium Carbonate Antacid (ALKA-SELTZER ANTACID PO) Take 2 tablets by mouth daily as needed (for indegestion).     . Cranberry 200 MG CAPS Take 2 capsules 2 (two) times daily by mouth.    . cyanocobalamin (,VITAMIN B-12,) 1000 MCG/ML injection Inject 1,000 mcg into the muscle every 30 (thirty) days.    Marland Kitchen docusate sodium (COLACE) 100 MG capsule Take 200 mg by mouth daily as needed for mild constipation.     Marland Kitchen EASY TOUCH LANCETS 30G MISC USE 1 LANCETS TO CHECK BLOOD SUGAR  ONCE DAILY  DX:E11.8 100 each 12  . ELDERBERRY PO Take 1 tablet by mouth as needed.     Marland Kitchen glipiZIDE (GLUCOTROL) 5 MG tablet Take 0.5 tablets (2.5 mg total) by mouth daily before breakfast. 45 tablet 1  . glucose blood test strip One Touch Verio strips Use as instructed to check fasting sugars once  daily .Dx:E11.9 100 each 12  . ketoconazole (NIZORAL) 2 % shampoo     . levothyroxine (SYNTHROID, LEVOTHROID) 50 MCG tablet Take 1 tablet (50 mcg total) by mouth daily. 90 tablet 1  . Miconazole Nitrate (TRIPLE PASTE AF) 2 % OINT Apply 1 application topically daily as needed (jock itch).     . Multiple Vitamins-Minerals (ICAPS AREDS 2 PO) Take 2 capsules by mouth daily.    . Omega-3 Fatty Acids (FISH OIL) 1000 MG CAPS Take 1 capsule 2 (two) times daily by mouth.    . Probiotic Product (PROBIOTIC-10 PO) Take 3 capsules daily by mouth.    Marland Kitchen amiodarone (PACERONE) 200 MG tablet Take 1 tablet (200 mg total) by mouth 2 (two) times daily. Discontinue after 04/23/17 doses. (Patient not taking: Reported on 10/15/2017)    . mupirocin ointment (BACTROBAN) 2 % Place 1 application into the nose 2 (two) times daily.     No current facility-administered medications on file prior to visit.     Allergies  Allergen Reactions  . Flomax [Tamsulosin Hcl] Other (See Comments)    Hypotension  . Shellfish Allergy Anaphylaxis  . Aspirin-Dipyridamole Er Other (See Comments)    Headaches   . Other Other (See Comments)    Blood Thinner given in Rehab gave H/As - Not Coumadin/Headaches  Family History  Problem Relation Age of Onset  . Pneumonia Mother 21       Deceased  . GI Bleed Father 69       Deceased - Ulcers  . Diabetes Cousin   . Diabetes Brother   . Cancer Paternal Aunt     Social History   Socioeconomic History  . Marital status: Married    Spouse name: Not on file  . Number of children: Not on file  . Years of education: Not on file  . Highest education level: Not on file  Occupational History  . Not on file  Social Needs  . Financial resource strain: Not on file  . Food insecurity:    Worry: Not on file    Inability: Not on file  . Transportation needs:    Medical: Not on file    Non-medical: Not on file  Tobacco Use  . Smoking status: Former Research scientist (life sciences)  . Smokeless tobacco:  Never Used  . Tobacco comment: Hasn't smoked since age 63  Substance and Sexual Activity  . Alcohol use: No    Alcohol/week: 0.0 oz  . Drug use: No  . Sexual activity: Never  Lifestyle  . Physical activity:    Days per week: Not on file    Minutes per session: Not on file  . Stress: Not on file  Relationships  . Social connections:    Talks on phone: Not on file    Gets together: Not on file    Attends religious service: Not on file    Active member of club or organization: Not on file    Attends meetings of clubs or organizations: Not on file    Relationship status: Not on file  Other Topics Concern  . Not on file  Social History Narrative   Lives with wife in a one story home.  Has 1 child.  Retired Armed forces technical officer.  Education: Oceanographer.     Review of Systems - See HPI.  All other ROS are negative.  BP 120/82   Pulse 71   Temp (!) 97.4 F (36.3 C) (Oral)   Resp 15   Ht 5\' 6"  (1.676 m)   Wt 163 lb 3.2 oz (74 kg)   SpO2 96%   BMI 26.34 kg/m   Physical Exam  Constitutional: He appears well-developed and well-nourished.  HENT:  Head: Normocephalic and atraumatic.  L TM within normal limits. Cerumen impaction of R ear canal noted.  Cardiovascular: Normal rate, regular rhythm, normal heart sounds and intact distal pulses.  Pulses:      Dorsalis pedis pulses are 2+ on the right side, and 2+ on the left side.       Posterior tibial pulses are 2+ on the right side, and 2+ on the left side.  No peripheral edema noted on examination  Pulmonary/Chest: Effort normal and breath sounds normal. No stridor. No respiratory distress. He has no wheezes. He has no rales.  Vitals reviewed.   Assessment/Plan: 1. Uncontrolled type 2 diabetes mellitus with hyperglycemia ([place]) Up-to-date on eye and foot exam. Immunizations are up-to-date. Will update labs today. Recent elevation in sugar levels likely due to recent infection. Limit carbs. They are to keep a close eye on sugars. If not  improving, may have to alter dose of SU. - CBC with Differential/Platelet - Lipid panel - Hemoglobin A1c - Comprehensive metabolic panel - Vitamin D (25 hydroxy) - B12  2. Essential hypertension BP stable. Asymptomatic. Continue current regimen. - CBC  with Differential/Platelet - Lipid panel - Hemoglobin A1c - Comprehensive metabolic panel - Vitamin D (25 hydroxy) - B12  3. CKD (chronic kidney disease) stage 3, GFR 30-59 ml/min (HCC) Followed by Nephrology. Repeat CMP today.  4. Impacted cerumen of right ear Wax unable to be removed manually. Successful removal with ear lavage.    Leeanne Rio, PA-C

## 2018-03-16 ENCOUNTER — Other Ambulatory Visit: Payer: Self-pay | Admitting: Physician Assistant

## 2018-03-16 DIAGNOSIS — N183 Chronic kidney disease, stage 3 unspecified: Secondary | ICD-10-CM

## 2018-03-23 DIAGNOSIS — R339 Retention of urine, unspecified: Secondary | ICD-10-CM | POA: Diagnosis not present

## 2018-03-27 DIAGNOSIS — E1122 Type 2 diabetes mellitus with diabetic chronic kidney disease: Secondary | ICD-10-CM | POA: Diagnosis not present

## 2018-03-27 DIAGNOSIS — D631 Anemia in chronic kidney disease: Secondary | ICD-10-CM | POA: Diagnosis not present

## 2018-03-27 DIAGNOSIS — N183 Chronic kidney disease, stage 3 (moderate): Secondary | ICD-10-CM | POA: Diagnosis not present

## 2018-03-27 DIAGNOSIS — N2581 Secondary hyperparathyroidism of renal origin: Secondary | ICD-10-CM | POA: Diagnosis not present

## 2018-03-30 ENCOUNTER — Other Ambulatory Visit (INDEPENDENT_AMBULATORY_CARE_PROVIDER_SITE_OTHER): Payer: Medicare Other

## 2018-03-30 ENCOUNTER — Encounter: Payer: Self-pay | Admitting: Physician Assistant

## 2018-03-30 DIAGNOSIS — Z8673 Personal history of transient ischemic attack (TIA), and cerebral infarction without residual deficits: Secondary | ICD-10-CM

## 2018-03-30 DIAGNOSIS — R531 Weakness: Secondary | ICD-10-CM

## 2018-03-30 DIAGNOSIS — N183 Chronic kidney disease, stage 3 unspecified: Secondary | ICD-10-CM

## 2018-03-30 LAB — BASIC METABOLIC PANEL
BUN: 23 mg/dL (ref 6–23)
CALCIUM: 9.3 mg/dL (ref 8.4–10.5)
CO2: 23 meq/L (ref 19–32)
CREATININE: 1.61 mg/dL — AB (ref 0.40–1.50)
Chloride: 101 mEq/L (ref 96–112)
GFR: 42.84 mL/min — ABNORMAL LOW (ref >60.00)
GLUCOSE: 225 mg/dL — AB (ref 70–99)
Potassium: 4.2 mEq/L (ref 3.5–5.1)
Sodium: 134 mEq/L — ABNORMAL LOW (ref 135–145)

## 2018-03-31 ENCOUNTER — Encounter: Payer: Self-pay | Admitting: Emergency Medicine

## 2018-04-01 NOTE — Addendum Note (Signed)
Addended by: [name] on: 04/01/2018 07:52 AM   Modules accepted: Orders

## 2018-04-17 ENCOUNTER — Ambulatory Visit: Payer: Self-pay | Admitting: Physician Assistant

## 2018-04-17 DIAGNOSIS — E869 Volume depletion, unspecified: Secondary | ICD-10-CM | POA: Diagnosis present

## 2018-04-17 DIAGNOSIS — J181 Lobar pneumonia, unspecified organism: Secondary | ICD-10-CM | POA: Diagnosis not present

## 2018-04-17 DIAGNOSIS — Z85828 Personal history of other malignant neoplasm of skin: Secondary | ICD-10-CM | POA: Diagnosis not present

## 2018-04-17 DIAGNOSIS — B9689 Other specified bacterial agents as the cause of diseases classified elsewhere: Secondary | ICD-10-CM | POA: Diagnosis not present

## 2018-04-17 DIAGNOSIS — Z8673 Personal history of transient ischemic attack (TIA), and cerebral infarction without residual deficits: Secondary | ICD-10-CM | POA: Diagnosis not present

## 2018-04-17 DIAGNOSIS — N3090 Cystitis, unspecified without hematuria: Secondary | ICD-10-CM | POA: Diagnosis not present

## 2018-04-17 DIAGNOSIS — Z7901 Long term (current) use of anticoagulants: Secondary | ICD-10-CM | POA: Diagnosis not present

## 2018-04-17 DIAGNOSIS — R0902 Hypoxemia: Secondary | ICD-10-CM | POA: Diagnosis not present

## 2018-04-17 DIAGNOSIS — I499 Cardiac arrhythmia, unspecified: Secondary | ICD-10-CM | POA: Diagnosis not present

## 2018-04-17 DIAGNOSIS — Z960 Presence of urogenital implants: Secondary | ICD-10-CM | POA: Diagnosis not present

## 2018-04-17 DIAGNOSIS — N179 Acute kidney failure, unspecified: Secondary | ICD-10-CM | POA: Diagnosis not present

## 2018-04-17 DIAGNOSIS — E1165 Type 2 diabetes mellitus with hyperglycemia: Secondary | ICD-10-CM | POA: Diagnosis not present

## 2018-04-17 DIAGNOSIS — Z7409 Other reduced mobility: Secondary | ICD-10-CM | POA: Diagnosis not present

## 2018-04-17 DIAGNOSIS — R1312 Dysphagia, oropharyngeal phase: Secondary | ICD-10-CM | POA: Diagnosis present

## 2018-04-17 DIAGNOSIS — N319 Neuromuscular dysfunction of bladder, unspecified: Secondary | ICD-10-CM | POA: Diagnosis present

## 2018-04-17 DIAGNOSIS — E1122 Type 2 diabetes mellitus with diabetic chronic kidney disease: Secondary | ICD-10-CM | POA: Diagnosis present

## 2018-04-17 DIAGNOSIS — I69392 Facial weakness following cerebral infarction: Secondary | ICD-10-CM | POA: Diagnosis not present

## 2018-04-17 DIAGNOSIS — I69391 Dysphagia following cerebral infarction: Secondary | ICD-10-CM | POA: Diagnosis not present

## 2018-04-17 DIAGNOSIS — I129 Hypertensive chronic kidney disease with stage 1 through stage 4 chronic kidney disease, or unspecified chronic kidney disease: Secondary | ICD-10-CM | POA: Diagnosis present

## 2018-04-17 DIAGNOSIS — N3000 Acute cystitis without hematuria: Secondary | ICD-10-CM | POA: Diagnosis not present

## 2018-04-17 DIAGNOSIS — N183 Chronic kidney disease, stage 3 (moderate): Secondary | ICD-10-CM | POA: Diagnosis present

## 2018-04-17 DIAGNOSIS — D72829 Elevated white blood cell count, unspecified: Secondary | ICD-10-CM | POA: Diagnosis not present

## 2018-04-17 DIAGNOSIS — I480 Paroxysmal atrial fibrillation: Secondary | ICD-10-CM | POA: Diagnosis not present

## 2018-04-17 DIAGNOSIS — I4510 Unspecified right bundle-branch block: Secondary | ICD-10-CM | POA: Diagnosis not present

## 2018-04-17 DIAGNOSIS — E871 Hypo-osmolality and hyponatremia: Secondary | ICD-10-CM | POA: Diagnosis present

## 2018-04-17 DIAGNOSIS — Z85528 Personal history of other malignant neoplasm of kidney: Secondary | ICD-10-CM | POA: Diagnosis not present

## 2018-04-17 DIAGNOSIS — N289 Disorder of kidney and ureter, unspecified: Secondary | ICD-10-CM | POA: Diagnosis not present

## 2018-04-17 DIAGNOSIS — Z87440 Personal history of urinary (tract) infections: Secondary | ICD-10-CM | POA: Diagnosis not present

## 2018-04-17 DIAGNOSIS — Z87891 Personal history of nicotine dependence: Secondary | ICD-10-CM | POA: Diagnosis not present

## 2018-04-17 DIAGNOSIS — J690 Pneumonitis due to inhalation of food and vomit: Secondary | ICD-10-CM | POA: Diagnosis not present

## 2018-04-17 DIAGNOSIS — R05 Cough: Secondary | ICD-10-CM | POA: Diagnosis not present

## 2018-04-17 DIAGNOSIS — I69398 Other sequelae of cerebral infarction: Secondary | ICD-10-CM | POA: Diagnosis not present

## 2018-04-17 DIAGNOSIS — R1310 Dysphagia, unspecified: Secondary | ICD-10-CM | POA: Diagnosis not present

## 2018-04-17 NOTE — Telephone Encounter (Signed)
Pt's wife called to report pt was eating last night and began coughing. Pt coughed up a piece of food into a napkin. Wife stated that she thought he was having trouble swallowing solids. She stated when pt takes his medicine he tends to hold them in his mouth "to mix with the saliva" before he swallows the meds. Wife stated that she has been cutting up his food in small pieces but pt will eat more than one piece at a time.  Today, wife stated that he is lethargic and has a fever of 99.7 degrees. Wife stated his normal temperature runs 97.6 degrees. His nurses aide thought he has been having swallowing difficulties since October.  Wife stated that he is not inhaling with the normal amount of volume he normally does, he is not inhaling as deep as he once could with the incentive spirometer.   Wife stated they have a friend who works for EMS and stated their friend heard crackles to bilateral bases.   Pt has a h/o CVA and dementia.  Wife stated last night, the pt could hardly talk and had a lot of mucous in his throat.  Wife stated she has to wake him up to drink and then he quickly falls asleep. Wife tearful during call. Emotional support provided. Wife advised to take pt to the nearest ED.  Called PCP office and spoke with Elyn Aquas PA-C and informed him of the triage call. He recommended pt go to the ED to be evaluated. Pt's wife informed that Probation officer spoke with pt's PCP and wants pt to be seen at the ED.  Wife stated that she will call EMS to transport patient to ED at [facility]. Called wife back and left message to have pt transferred to [facility] or [facility] in case pt needs to be admitted. Reason for Disposition . Patient sounds very sick or weak to the triager  Answer Assessment - Initial Assessment Questions 1. SYMPTOM: "Are you having difficulty swallowing liquids, solids, or both?"     solids 2. ONSET: "When did the swallowing problems begin?"     since last October 3. CAUSE:  "What do you think is causing the problem?"      CVA 1 year ago, dementia 4. CHRONIC/RECURRENT: "Is this a new problem for you?"  If no, ask: "How long have you had this problem?" (e.g., days, weeks, months)  No- Since October 5. OTHER SYMPTOMS: "Do you have any other symptoms?" (e.g., difficulty breathing, sore throat, swollen tongue, chest pain)     No couldn't not do the incentive spirometer as high as he usually can, lethargy, low grade fever 99.7, weak 6. PREGNANCY: "Is there any chance you are pregnant?" "When was your last menstrual period?"     n/a  Protocols used: SWALLOWING DIFFICULTY-A-AH

## 2018-04-19 MED ORDER — INSULIN LISPRO 100 UNIT/ML ~~LOC~~ SOLN
2.00 | SUBCUTANEOUS | Status: DC
Start: 2018-04-20 — End: 2018-04-19

## 2018-04-19 MED ORDER — ACETAMINOPHEN 325 MG PO TABS
650.00 | ORAL_TABLET | ORAL | Status: DC
Start: ? — End: 2018-04-19

## 2018-04-19 MED ORDER — GENERIC EXTERNAL MEDICATION
2.25 g | Status: DC
Start: 2018-04-20 — End: 2018-04-19

## 2018-04-19 MED ORDER — DEXTROSE 50 % IV SOLN
12.00 g | INTRAVENOUS | Status: DC
Start: ? — End: 2018-04-19

## 2018-04-19 MED ORDER — LEVOTHYROXINE SODIUM 50 MCG PO TABS
50.00 | ORAL_TABLET | ORAL | Status: DC
Start: 2018-04-20 — End: 2018-04-19

## 2018-04-19 MED ORDER — DOCUSATE SODIUM 100 MG PO CAPS
100.00 | ORAL_CAPSULE | ORAL | Status: DC
Start: ? — End: 2018-04-19

## 2018-04-19 MED ORDER — APIXABAN 2.5 MG PO TABS
2.50 | ORAL_TABLET | ORAL | Status: DC
Start: 2018-04-19 — End: 2018-04-19

## 2018-04-19 MED ORDER — GLUCOSE 40 % PO GEL
15.00 g | ORAL | Status: DC
Start: ? — End: 2018-04-19

## 2018-04-19 MED ORDER — VANCOMYCIN HCL 50 MG/ML PO SOLR
125.00 | ORAL | Status: DC
Start: 2018-04-20 — End: 2018-04-19

## 2018-04-19 MED ORDER — ONDANSETRON HCL 4 MG/2ML IJ SOLN
4.00 | INTRAMUSCULAR | Status: DC
Start: ? — End: 2018-04-19

## 2018-04-19 MED ORDER — GENERIC EXTERNAL MEDICATION
1.00 | Status: DC
Start: ? — End: 2018-04-19

## 2018-04-19 MED ORDER — HYDRALAZINE HCL 20 MG/ML IJ SOLN
10.00 | INTRAMUSCULAR | Status: DC
Start: ? — End: 2018-04-19

## 2018-04-19 MED ORDER — BISACODYL 5 MG PO TBEC
10.00 | DELAYED_RELEASE_TABLET | ORAL | Status: DC
Start: ? — End: 2018-04-19

## 2018-04-19 MED ORDER — GUAIFENESIN-DM 100-10 MG/5ML PO SYRP
5.00 | ORAL_SOLUTION | ORAL | Status: DC
Start: ? — End: 2018-04-19

## 2018-04-19 MED ORDER — GENERIC EXTERNAL MEDICATION
1.00 | Status: DC
Start: 2018-04-19 — End: 2018-04-19

## 2018-04-24 ENCOUNTER — Encounter: Payer: Self-pay | Admitting: Physician Assistant

## 2018-04-24 ENCOUNTER — Telehealth: Payer: Self-pay | Admitting: Neurology

## 2018-04-24 DIAGNOSIS — E538 Deficiency of other specified B group vitamins: Secondary | ICD-10-CM

## 2018-04-24 MED ORDER — CYANOCOBALAMIN 1000 MCG/ML IJ SOLN: 1000.0000 ug | INTRAMUSCULAR | 12 refills | Status: AC

## 2018-04-24 NOTE — Telephone Encounter (Signed)
Pt's wife called to request rf for B12 injection sent to [facility] delivery pharmacy. Pt clx'ed appt with Dr. Tomi Likens that was scheduled on 04/27/18 due to being hospitalized with pneumonia.

## 2018-04-24 NOTE — Telephone Encounter (Signed)
Dr. Jaffe patient 

## 2018-04-27 ENCOUNTER — Ambulatory Visit: Payer: Medicare Other | Admitting: Neurology

## 2018-04-27 ENCOUNTER — Encounter

## 2018-04-28 ENCOUNTER — Telehealth: Payer: Self-pay | Admitting: Physician Assistant

## 2018-04-28 DIAGNOSIS — I480 Paroxysmal atrial fibrillation: Secondary | ICD-10-CM | POA: Diagnosis not present

## 2018-04-28 DIAGNOSIS — Z7984 Long term (current) use of oral hypoglycemic drugs: Secondary | ICD-10-CM | POA: Diagnosis not present

## 2018-04-28 DIAGNOSIS — N183 Chronic kidney disease, stage 3 (moderate): Secondary | ICD-10-CM | POA: Diagnosis not present

## 2018-04-28 DIAGNOSIS — I69854 Hemiplegia and hemiparesis following other cerebrovascular disease affecting left non-dominant side: Secondary | ICD-10-CM | POA: Diagnosis not present

## 2018-04-28 DIAGNOSIS — N319 Neuromuscular dysfunction of bladder, unspecified: Secondary | ICD-10-CM | POA: Diagnosis not present

## 2018-04-28 DIAGNOSIS — Z466 Encounter for fitting and adjustment of urinary device: Secondary | ICD-10-CM | POA: Diagnosis not present

## 2018-04-28 DIAGNOSIS — R1312 Dysphagia, oropharyngeal phase: Secondary | ICD-10-CM | POA: Diagnosis not present

## 2018-04-28 DIAGNOSIS — J690 Pneumonitis due to inhalation of food and vomit: Secondary | ICD-10-CM | POA: Diagnosis not present

## 2018-04-28 DIAGNOSIS — E1122 Type 2 diabetes mellitus with diabetic chronic kidney disease: Secondary | ICD-10-CM | POA: Diagnosis not present

## 2018-04-28 DIAGNOSIS — Z7901 Long term (current) use of anticoagulants: Secondary | ICD-10-CM | POA: Diagnosis not present

## 2018-04-28 DIAGNOSIS — I129 Hypertensive chronic kidney disease with stage 1 through stage 4 chronic kidney disease, or unspecified chronic kidney disease: Secondary | ICD-10-CM | POA: Diagnosis not present

## 2018-04-28 DIAGNOSIS — Z87440 Personal history of urinary (tract) infections: Secondary | ICD-10-CM | POA: Diagnosis not present

## 2018-04-28 NOTE — Telephone Encounter (Signed)
Copied from [place] [id number]. Topic: Quick Communication - See Telephone Encounter >> Apr 28, 2018  3:54 PM Berneta Levins wrote: CRM for notification. See Telephone encounter for: 04/28/18. Margarita Grizzle with [place] calling - pt was discharged from hospital on 08/04 for aspiration pneumonia. Verbal orders needed for Skilled Nursing (diabetes, aspiration prevention, and medication mangement) 2x week 1 week 1x week 8 weeks Verbal orders Home Health Aid (bathing and assistance with ADLs) 1x week 4 weeks Verbal order for Speech Therapy (aspiration pneumonia) Initial evaluation Margarita Grizzle can be reached at [phone]

## 2018-04-28 NOTE — Telephone Encounter (Signed)
Ok to give verbal orders?

## 2018-04-28 NOTE — Telephone Encounter (Deleted)
Copied from [place] [phone] [id number]. Topic: Quick Communication - See Telephone Encounter >> Apr 28, 2018  3:54 PM Berneta Levins wrote: CRM for notification. See Telephone encounter for: 04/28/18. Margarita Grizzle with [facility] calling - pt was discharged from hospital on 08/04 for aspiration pneumonia. Verbal orders needed for Skilled Nursing (diabetes, aspiration prevention, and medication mangement) 2x week 1 week 1x week 8 weeks Verbal orders Home Health Aid (bathing and assistance with ADLs) 1x week 4 weeks Verbal order for Speech Therapy (aspiration pneumonia) Initial evaluation Margarita Grizzle can be reached at [phone]

## 2018-04-29 NOTE — Telephone Encounter (Signed)
Called patient and LMOVM that per Einar Pheasant the verbal orders for patient are OK for this patient.  [place] for [facility] to Discuss results / PCP / recommendations / Schedule patient  CRM Created in case Margarita Grizzle with [facility] calls back. Please give OK per [facility] for Verbal orders for patient. Please see phone message from 04/28/18. Thank  You!

## 2018-04-30 ENCOUNTER — Telehealth: Payer: Self-pay | Admitting: Emergency Medicine

## 2018-04-30 DIAGNOSIS — I69854 Hemiplegia and hemiparesis following other cerebrovascular disease affecting left non-dominant side: Secondary | ICD-10-CM | POA: Diagnosis not present

## 2018-04-30 DIAGNOSIS — R1312 Dysphagia, oropharyngeal phase: Secondary | ICD-10-CM | POA: Diagnosis not present

## 2018-04-30 DIAGNOSIS — N183 Chronic kidney disease, stage 3 (moderate): Secondary | ICD-10-CM | POA: Diagnosis not present

## 2018-04-30 DIAGNOSIS — I129 Hypertensive chronic kidney disease with stage 1 through stage 4 chronic kidney disease, or unspecified chronic kidney disease: Secondary | ICD-10-CM | POA: Diagnosis not present

## 2018-04-30 DIAGNOSIS — E1122 Type 2 diabetes mellitus with diabetic chronic kidney disease: Secondary | ICD-10-CM | POA: Diagnosis not present

## 2018-04-30 DIAGNOSIS — J690 Pneumonitis due to inhalation of food and vomit: Secondary | ICD-10-CM | POA: Diagnosis not present

## 2018-04-30 NOTE — Telephone Encounter (Signed)
Ok to give verbal order.

## 2018-04-30 NOTE — Telephone Encounter (Signed)
Copied from [place] [id number]. Topic: Inquiry >> Apr 30, 2018  4:39 PM Oliver Pila B wrote: Reason for CRM: [facility] called for verbals for OT contact [phone] leave message if needed

## 2018-05-01 ENCOUNTER — Ambulatory Visit (INDEPENDENT_AMBULATORY_CARE_PROVIDER_SITE_OTHER): Payer: Medicare Other | Admitting: Physician Assistant

## 2018-05-01 ENCOUNTER — Encounter: Payer: Self-pay | Admitting: Physician Assistant

## 2018-05-01 VITALS — BP 102/60 | HR 95 | Temp 97.7°F | Resp 16 | Ht 66.0 in | Wt 164.2 lb

## 2018-05-01 DIAGNOSIS — R600 Localized edema: Secondary | ICD-10-CM

## 2018-05-01 DIAGNOSIS — E1122 Type 2 diabetes mellitus with diabetic chronic kidney disease: Secondary | ICD-10-CM | POA: Diagnosis not present

## 2018-05-01 DIAGNOSIS — I69854 Hemiplegia and hemiparesis following other cerebrovascular disease affecting left non-dominant side: Secondary | ICD-10-CM | POA: Diagnosis not present

## 2018-05-01 DIAGNOSIS — N183 Chronic kidney disease, stage 3 (moderate): Secondary | ICD-10-CM

## 2018-05-01 DIAGNOSIS — N179 Acute kidney failure, unspecified: Secondary | ICD-10-CM

## 2018-05-01 DIAGNOSIS — R1312 Dysphagia, oropharyngeal phase: Secondary | ICD-10-CM | POA: Diagnosis not present

## 2018-05-01 DIAGNOSIS — J690 Pneumonitis due to inhalation of food and vomit: Secondary | ICD-10-CM | POA: Diagnosis not present

## 2018-05-01 DIAGNOSIS — I129 Hypertensive chronic kidney disease with stage 1 through stage 4 chronic kidney disease, or unspecified chronic kidney disease: Secondary | ICD-10-CM | POA: Diagnosis not present

## 2018-05-01 LAB — CBC WITH DIFFERENTIAL/PLATELET
BASOS ABS: 0.1 10*3/uL (ref 0.0–0.1)
Basophils Relative: 0.7 % (ref 0.0–3.0)
Eosinophils Absolute: 0.8 10*3/uL — ABNORMAL HIGH (ref 0.0–0.7)
Eosinophils Relative: 6.6 % — ABNORMAL HIGH (ref 0.0–5.0)
HCT: 34.9 % — ABNORMAL LOW (ref 39.0–52.0)
Hemoglobin: 11.9 g/dL — ABNORMAL LOW (ref 13.0–17.0)
Lymphocytes Relative: 20.2 % (ref 12.0–46.0)
Lymphs Abs: 2.5 10*3/uL (ref 0.7–4.0)
MCHC: 34.1 g/dL (ref 30.0–36.0)
MCV: 84.6 fl (ref 78.0–100.0)
Monocytes Absolute: 0.7 10*3/uL (ref 0.1–1.0)
Monocytes Relative: 5.4 % (ref 3.0–12.0)
NEUTROS ABS: 8.3 10*3/uL — AB (ref 1.4–7.7)
Neutrophils Relative %: 67.1 % (ref 43.0–77.0)
PLATELETS: 322 10*3/uL (ref 150.0–400.0)
RBC: 4.13 Mil/uL — AB (ref 4.22–5.81)
RDW: 14.2 % (ref 11.5–15.5)
WBC: 12.4 10*3/uL — ABNORMAL HIGH (ref 4.0–10.5)

## 2018-05-01 LAB — COMPREHENSIVE METABOLIC PANEL
ALBUMIN: 4.1 g/dL (ref 3.5–5.2)
ALK PHOS: 65 U/L (ref 39–117)
ALT: 12 U/L (ref 0–53)
AST: 15 U/L (ref 0–37)
BILIRUBIN TOTAL: 0.4 mg/dL (ref 0.2–1.2)
BUN: 22 mg/dL (ref 6–23)
CO2: 24 mEq/L (ref 19–32)
Calcium: 10 mg/dL (ref 8.4–10.5)
Chloride: 99 mEq/L (ref 96–112)
Creatinine, Ser: 1.74 mg/dL — ABNORMAL HIGH (ref 0.40–1.50)
GFR: 39.16 mL/min — AB (ref >60.00)
Glucose, Bld: 213 mg/dL — ABNORMAL HIGH (ref 70–99)
Potassium: 4.1 mEq/L (ref 3.5–5.1)
Sodium: 133 mEq/L — ABNORMAL LOW (ref 135–145)
TOTAL PROTEIN: 7.5 g/dL (ref 6.0–8.3)

## 2018-05-01 NOTE — Telephone Encounter (Signed)
Left detailed message giving verbal orders for OT for patient. Advised to have written documentation fax over the office for signature.

## 2018-05-01 NOTE — Progress Notes (Signed)
Patient presents to clinic today for hospitalization follow-up. Patient presented to [facility] on 04/17/18 via EMS with concern for aspiration pneumonia. Patient has aspirated on a piece of pizza the night before, subsequently developing nausea, chills and chest congestion. ER assessment was concerning for aspiration pneumonia and potential cystitis. Patient was started on empiric antibiotics and admitted to the hospital for further evaluation and management. During hospitalization, CT chest obtained confirming aspiration pneumonia. Patient was changes to IV Zosyn. Leukocytosis resolved. Repeat CXR 04/25/18 negative for consolidation. Patient was transitioned to PO Vancomycin to complete a total of 7-days of medication. Speech Therapy was consulted and a swallow study was performed. Ground diet with thin liquids recommended. BP was elevated during hospitalization. As such, he was started on 10 mg Norvasc. In patient rehabilitation was recommended but family declined. He was discharged on 04/26/18 to follow-up here and with his Nephrologist.   Since discharge, patient and caregiver (wife) endorse that patient is doing very well overall. Denies any chest congestion, cough, fever, chills, nausea or vomiting. Note BP have been on the lower end of normal with the amlodipine. Also notes swelling of feet and ankles. Is hydrating well and eating well. Good urinary output from catheter. Glucose levels have been elevated up to 250 at home since returning home from hospital.   Past Medical History:  Diagnosis Date  . A-fib ([place])   . C. difficile diarrhea   . Cataracts, bilateral   . Chronic dermatitis   . Diabetes type 2, controlled ([place]) 2000  . History of chicken pox   . Mumps    Adult  . Retinopathy   . Shingles   . Stroke [facility]) Nov 2011  . Undescended testicle, unilateral    Right  . Whooping cough     Current Outpatient Medications on File Prior to Visit  Medication Sig Dispense Refill  .  amLODipine (NORVASC) 10 MG tablet Take 1 tablet by mouth daily.    Marland Kitchen apixaban (ELIQUIS) 2.5 MG TABS tablet Take 1 tablet (2.5 mg total) by mouth 2 (two) times daily. 10 tablet 0  . Calcium Carbonate Antacid (ALKA-SELTZER ANTACID PO) Take 2 tablets by mouth daily as needed (for indegestion).     . Cranberry 200 MG CAPS Take 2 capsules 2 (two) times daily by mouth.    . cyanocobalamin (,VITAMIN B-12,) 1000 MCG/ML injection Inject 1 mL (1,000 mcg total) into the muscle every 30 (thirty) days. 1 mL 12  . docusate sodium (COLACE) 100 MG capsule Take 200 mg by mouth daily as needed for mild constipation.     Marland Kitchen EASY TOUCH LANCETS 30G MISC USE 1 LANCETS TO CHECK BLOOD SUGAR  ONCE DAILY  DX:E11.8 100 each 12  . ELDERBERRY PO Take 1 tablet by mouth as needed.     Marland Kitchen glipiZIDE (GLUCOTROL) 5 MG tablet Take 0.5 tablets (2.5 mg total) by mouth daily before breakfast. (Patient taking differently: Take 5 mg by mouth daily before breakfast. ) 45 tablet 1  . glucose blood test strip One Touch Verio strips Use as instructed to check fasting sugars once daily .Dx:E11.9 100 each 12  . ketoconazole (NIZORAL) 2 % shampoo     . levothyroxine (SYNTHROID, LEVOTHROID) 50 MCG tablet Take 1 tablet (50 mcg total) by mouth daily. 90 tablet 1  . Miconazole Nitrate (TRIPLE PASTE AF) 2 % OINT Apply 1 application topically daily as needed (jock itch).     . Multiple Vitamins-Minerals (ICAPS AREDS 2 PO) Take 2 capsules by  mouth daily.    . mupirocin ointment (BACTROBAN) 2 % Place 1 application into the nose 2 (two) times daily.    . Omega-3 Fatty Acids (FISH OIL) 1000 MG CAPS Take 1 capsule 2 (two) times daily by mouth.    . Probiotic Product (PROBIOTIC-10 PO) Take 3 capsules daily by mouth.    Marland Kitchen amiodarone (PACERONE) 200 MG tablet Take 1 tablet (200 mg total) by mouth 2 (two) times daily. Discontinue after 04/23/17 doses. (Patient not taking: Reported on 10/15/2017)    . sodium bicarbonate 650 MG tablet Take 650 mg by mouth 2 (two)  times daily. for 10 days  0  . vancomycin (VANCOCIN) 125 MG capsule TAKE 1 CAPSULE BY MOUTH 4 TIMES DAILY FOR 7 DAYS  0   No current facility-administered medications on file prior to visit.     Allergies  Allergen Reactions  . Pneumococcal Vaccine Rash  . Shellfish Allergy Anaphylaxis  . Tamsulosin Hcl Other (See Comments)    Hypotension Hypotension  . Aspirin-Dipyridamole Er Other (See Comments)    Headaches   . Other Other (See Comments)    Blood Thinner given in Rehab gave H/As - Not Coumadin/Headaches      Family History  Problem Relation Age of Onset  . Pneumonia Mother 43       Deceased  . GI Bleed Father 59       Deceased - Ulcers  . Diabetes Cousin   . Diabetes Brother   . Cancer Paternal Aunt     Social History   Socioeconomic History  . Marital status: Married    Spouse name: Not on file  . Number of children: Not on file  . Years of education: Not on file  . Highest education level: Not on file  Occupational History  . Not on file  Social Needs  . Financial resource strain: Not on file  . Food insecurity:    Worry: Not on file    Inability: Not on file  . Transportation needs:    Medical: Not on file    Non-medical: Not on file  Tobacco Use  . Smoking status: Former Research scientist (life sciences)  . Smokeless tobacco: Never Used  . Tobacco comment: Hasn't smoked since age 32  Substance and Sexual Activity  . Alcohol use: No    Alcohol/week: 0.0 standard drinks  . Drug use: No  . Sexual activity: Never  Lifestyle  . Physical activity:    Days per week: Not on file    Minutes per session: Not on file  . Stress: Not on file  Relationships  . Social connections:    Talks on phone: Not on file    Gets together: Not on file    Attends religious service: Not on file    Active member of club or organization: Not on file    Attends meetings of clubs or organizations: Not on file    Relationship status: Not on file  Other Topics Concern  . Not on file  Social  History Narrative   Lives with wife in a one story home.  Has 1 child.  Retired Armed forces technical officer.  Education: Oceanographer.     Review of Systems - See HPI.  All other ROS are negative.  BP 102/60 (BP Location: Left Arm, Patient Position: Sitting, Cuff Size: Normal)   Pulse 95   Temp 97.7 F (36.5 C) (Oral)   Resp 16   Ht 5\' 6"  (1.676 m)   Wt 164 lb 4 oz (74.5  kg)   SpO2 97%   BMI 26.51 kg/m   Physical Exam  Constitutional: He is oriented to person, place, and time. He appears well-developed and well-nourished.  HENT:  Head: Normocephalic and atraumatic.  Eyes: Pupils are equal, round, and reactive to light. Conjunctivae are normal.  Neck: Neck supple.  Cardiovascular: Normal rate, regular rhythm, normal heart sounds and intact distal pulses.  1+ pedal edema noted bilaterally.  Pulmonary/Chest: Effort normal and breath sounds normal. No stridor. No respiratory distress. He has no wheezes. He has no rales. He exhibits no tenderness.  Neurological: He is alert and oriented to person, place, and time. No cranial nerve deficit.  Psychiatric: He has a normal mood and affect.  Vitals reviewed.  Recent Results (from the past 2160 hour(s))  CBC with Differential/Platelet     Status: Abnormal   Collection Time: 03/12/18 10:33 AM  Result Value Ref Range   WBC 9.6 4.0 - 10.5 K/uL   RBC 3.98 (L) 4.22 - 5.81 Mil/uL   Hemoglobin 11.6 (L) 13.0 - 17.0 g/dL   HCT 34.1 (L) 39.0 - 52.0 %   MCV 85.6 78.0 - 100.0 fl   MCHC 34.1 30.0 - 36.0 g/dL   RDW 14.3 11.5 - 15.5 %   Platelets 245.0 150.0 - 400.0 K/uL   Neutrophils Relative % 59.1 43.0 - 77.0 %   Lymphocytes Relative 23.2 12.0 - 46.0 %   Monocytes Relative 8.5 3.0 - 12.0 %   Eosinophils Relative 8.2 (H) 0.0 - 5.0 %   Basophils Relative 1.0 0.0 - 3.0 %   Neutro Abs 5.7 1.4 - 7.7 K/uL   Lymphs Abs 2.2 0.7 - 4.0 K/uL   Monocytes Absolute 0.8 0.1 - 1.0 K/uL   Eosinophils Absolute 0.8 (H) 0.0 - 0.7 K/uL   Basophils Absolute 0.1 0.0 - 0.1 K/uL    Lipid panel     Status: Abnormal   Collection Time: 03/12/18 10:33 AM  Result Value Ref Range   Cholesterol 186 0 - 200 mg/dL    Comment: ATP III Classification       Desirable:  < 200 mg/dL               Borderline High:  200 - 239 mg/dL          High:  > = 240 mg/dL   Triglycerides 159.0 (H) 0.0 - 149.0 mg/dL    Comment: Normal:  <150 mg/dLBorderline High:  150 - 199 mg/dL   HDL 35.90 (L) >39.00 mg/dL   VLDL 31.8 0.0 - 40.0 mg/dL   LDL Cholesterol 118 (H) 0 - 99 mg/dL   Total CHOL/HDL Ratio 5     Comment:                Men          Women1/2 Average Risk     3.4          3.3Average Risk          5.0          4.42X Average Risk          9.6          7.13X Average Risk          15.0          11.0                       NonHDL 149.96     Comment: NOTE:  Non-HDL goal  should be 30 mg/dL higher than patient's LDL goal (i.e. LDL goal of < 70 mg/dL, would have non-HDL goal of < 100 mg/dL)  Hemoglobin A1c     Status: Abnormal   Collection Time: 03/12/18 10:33 AM  Result Value Ref Range   Hgb A1c MFr Bld 7.9 (H) 4.6 - 6.5 %    Comment: Glycemic Control Guidelines for People with Diabetes:Non Diabetic:  <6%Goal of Therapy: <7%Additional Action Suggested:  >8%   Comprehensive metabolic panel     Status: Abnormal   Collection Time: 03/12/18 10:33 AM  Result Value Ref Range   Sodium 133 (L) 135 - 145 mEq/L   Potassium 4.5 3.5 - 5.1 mEq/L   Chloride 101 96 - 112 mEq/L   CO2 23 19 - 32 mEq/L   Glucose, Bld 206 (H) 70 - 99 mg/dL   BUN 30 (H) 6 - 23 mg/dL   Creatinine, Ser 1.74 (H) 0.40 - 1.50 mg/dL   Total Bilirubin 0.4 0.2 - 1.2 mg/dL   Alkaline Phosphatase 62 39 - 117 U/L   AST 11 0 - 37 U/L   ALT 10 0 - 53 U/L   Total Protein 7.1 6.0 - 8.3 g/dL   Albumin 4.0 3.5 - 5.2 g/dL   Calcium 9.6 8.4 - 10.5 mg/dL   GFR 39.17 (L) >60.00 mL/min  Vitamin D (25 hydroxy)     Status: None   Collection Time: 03/12/18 10:33 AM  Result Value Ref Range   VITD 64.36 30.00 - 100.00 ng/mL  B12     Status:  Abnormal   Collection Time: 03/12/18 10:33 AM  Result Value Ref Range   Vitamin B-12 1,373 (H) 211 - 911 pg/mL  Basic metabolic panel     Status: Abnormal   Collection Time: 03/30/18 11:17 AM  Result Value Ref Range   Sodium 134 (L) 135 - 145 mEq/L   Potassium 4.2 3.5 - 5.1 mEq/L   Chloride 101 96 - 112 mEq/L   CO2 23 19 - 32 mEq/L   Glucose, Bld 225 (H) 70 - 99 mg/dL   BUN 23 6 - 23 mg/dL   Creatinine, Ser 1.61 (H) 0.40 - 1.50 mg/dL   Calcium 9.3 8.4 - 10.5 mg/dL   GFR 42.84 (L) >60.00 mL/min   Assessment/Plan: 1. Acute renal failure superimposed on stage 3 chronic kidney disease, unspecified acute renal failure type ([place]) Good urinary output via foley catheter. Repeat CMP to reassess renal function and electrolytes to ensure stability. - CBC with Differential/Platelet - Comprehensive metabolic panel  2. Aspiration pneumonia due to regurgitated food, unspecified laterality, unspecified part of lung ([place]) Clinically resolved. Vitals stable. Asymptomatic. Will repeat labs today. Continue diet via ST recommendations. - CBC with Differential/Platelet - Comprehensive metabolic panel  3. Pedal edema Recheck renal function to endure stability. Suspect related to amlodipine. BP low end of normal. Will hold amlodipine. Follow-up 1 week for reassessment.    Leeanne Rio, PA-C

## 2018-05-01 NOTE — Patient Instructions (Signed)
Please go to the lab today for blood work.  I will call you with your results. We will alter treatment regimen(s) if indicated by your results.   Decrease the amlodipine to 5 mg (1/2 tablet) daily.  Keep hydrated. Continue other medications as directed. Follow-up in 1 week for reassessment of blood sugars.  Restart checking sugars daily and recording. Bring to follow-up in 1 week.

## 2018-05-04 DIAGNOSIS — R1312 Dysphagia, oropharyngeal phase: Secondary | ICD-10-CM | POA: Diagnosis not present

## 2018-05-04 DIAGNOSIS — I69854 Hemiplegia and hemiparesis following other cerebrovascular disease affecting left non-dominant side: Secondary | ICD-10-CM | POA: Diagnosis not present

## 2018-05-04 DIAGNOSIS — E1122 Type 2 diabetes mellitus with diabetic chronic kidney disease: Secondary | ICD-10-CM | POA: Diagnosis not present

## 2018-05-04 DIAGNOSIS — N183 Chronic kidney disease, stage 3 (moderate): Secondary | ICD-10-CM | POA: Diagnosis not present

## 2018-05-04 DIAGNOSIS — J690 Pneumonitis due to inhalation of food and vomit: Secondary | ICD-10-CM | POA: Diagnosis not present

## 2018-05-04 DIAGNOSIS — I129 Hypertensive chronic kidney disease with stage 1 through stage 4 chronic kidney disease, or unspecified chronic kidney disease: Secondary | ICD-10-CM | POA: Diagnosis not present

## 2018-05-05 ENCOUNTER — Other Ambulatory Visit: Payer: Self-pay

## 2018-05-05 DIAGNOSIS — E1122 Type 2 diabetes mellitus with diabetic chronic kidney disease: Secondary | ICD-10-CM | POA: Diagnosis not present

## 2018-05-05 DIAGNOSIS — R1312 Dysphagia, oropharyngeal phase: Secondary | ICD-10-CM | POA: Diagnosis not present

## 2018-05-05 DIAGNOSIS — I129 Hypertensive chronic kidney disease with stage 1 through stage 4 chronic kidney disease, or unspecified chronic kidney disease: Secondary | ICD-10-CM | POA: Diagnosis not present

## 2018-05-05 DIAGNOSIS — I69854 Hemiplegia and hemiparesis following other cerebrovascular disease affecting left non-dominant side: Secondary | ICD-10-CM | POA: Diagnosis not present

## 2018-05-05 DIAGNOSIS — N183 Chronic kidney disease, stage 3 (moderate): Secondary | ICD-10-CM | POA: Diagnosis not present

## 2018-05-05 DIAGNOSIS — J690 Pneumonitis due to inhalation of food and vomit: Secondary | ICD-10-CM | POA: Diagnosis not present

## 2018-05-05 DIAGNOSIS — R7989 Other specified abnormal findings of blood chemistry: Secondary | ICD-10-CM

## 2018-05-05 NOTE — Progress Notes (Signed)
BMP ordered

## 2018-05-06 ENCOUNTER — Telehealth: Payer: Self-pay | Admitting: Physician Assistant

## 2018-05-06 DIAGNOSIS — E1122 Type 2 diabetes mellitus with diabetic chronic kidney disease: Secondary | ICD-10-CM | POA: Diagnosis not present

## 2018-05-06 DIAGNOSIS — I129 Hypertensive chronic kidney disease with stage 1 through stage 4 chronic kidney disease, or unspecified chronic kidney disease: Secondary | ICD-10-CM | POA: Diagnosis not present

## 2018-05-06 DIAGNOSIS — J690 Pneumonitis due to inhalation of food and vomit: Secondary | ICD-10-CM | POA: Diagnosis not present

## 2018-05-06 DIAGNOSIS — I69854 Hemiplegia and hemiparesis following other cerebrovascular disease affecting left non-dominant side: Secondary | ICD-10-CM | POA: Diagnosis not present

## 2018-05-06 DIAGNOSIS — R1312 Dysphagia, oropharyngeal phase: Secondary | ICD-10-CM | POA: Diagnosis not present

## 2018-05-06 DIAGNOSIS — N183 Chronic kidney disease, stage 3 (moderate): Secondary | ICD-10-CM | POA: Diagnosis not present

## 2018-05-06 NOTE — Telephone Encounter (Signed)
Called Sonia Baller with [facility] at [phone] and left a detailed voice message and gave the OK per [facility] for the orders requested.

## 2018-05-06 NOTE — Telephone Encounter (Signed)
Ok to give verbal order.

## 2018-05-06 NOTE — Telephone Encounter (Signed)
Copied from [place]. Topic: General - Other >> May 06, 2018  8:42 AM Margot Ables wrote: Reason for CRM: requesting VO for ST for swallowing difficulties, 1 week 3 Ok to leave msg

## 2018-05-07 DIAGNOSIS — E1122 Type 2 diabetes mellitus with diabetic chronic kidney disease: Secondary | ICD-10-CM | POA: Diagnosis not present

## 2018-05-07 DIAGNOSIS — I129 Hypertensive chronic kidney disease with stage 1 through stage 4 chronic kidney disease, or unspecified chronic kidney disease: Secondary | ICD-10-CM | POA: Diagnosis not present

## 2018-05-07 DIAGNOSIS — R1312 Dysphagia, oropharyngeal phase: Secondary | ICD-10-CM | POA: Diagnosis not present

## 2018-05-07 DIAGNOSIS — N183 Chronic kidney disease, stage 3 (moderate): Secondary | ICD-10-CM | POA: Diagnosis not present

## 2018-05-07 DIAGNOSIS — J690 Pneumonitis due to inhalation of food and vomit: Secondary | ICD-10-CM | POA: Diagnosis not present

## 2018-05-07 DIAGNOSIS — I69854 Hemiplegia and hemiparesis following other cerebrovascular disease affecting left non-dominant side: Secondary | ICD-10-CM | POA: Diagnosis not present

## 2018-05-08 DIAGNOSIS — E1122 Type 2 diabetes mellitus with diabetic chronic kidney disease: Secondary | ICD-10-CM | POA: Diagnosis not present

## 2018-05-08 DIAGNOSIS — N183 Chronic kidney disease, stage 3 (moderate): Secondary | ICD-10-CM | POA: Diagnosis not present

## 2018-05-08 DIAGNOSIS — I69854 Hemiplegia and hemiparesis following other cerebrovascular disease affecting left non-dominant side: Secondary | ICD-10-CM | POA: Diagnosis not present

## 2018-05-08 DIAGNOSIS — R1312 Dysphagia, oropharyngeal phase: Secondary | ICD-10-CM | POA: Diagnosis not present

## 2018-05-08 DIAGNOSIS — I129 Hypertensive chronic kidney disease with stage 1 through stage 4 chronic kidney disease, or unspecified chronic kidney disease: Secondary | ICD-10-CM | POA: Diagnosis not present

## 2018-05-08 DIAGNOSIS — J690 Pneumonitis due to inhalation of food and vomit: Secondary | ICD-10-CM | POA: Diagnosis not present

## 2018-05-11 ENCOUNTER — Other Ambulatory Visit: Payer: Self-pay

## 2018-05-11 ENCOUNTER — Ambulatory Visit (INDEPENDENT_AMBULATORY_CARE_PROVIDER_SITE_OTHER): Payer: Medicare Other | Admitting: Podiatry

## 2018-05-11 ENCOUNTER — Encounter: Payer: Self-pay | Admitting: Physician Assistant

## 2018-05-11 ENCOUNTER — Ambulatory Visit (INDEPENDENT_AMBULATORY_CARE_PROVIDER_SITE_OTHER): Payer: Medicare Other | Admitting: Physician Assistant

## 2018-05-11 ENCOUNTER — Encounter: Payer: Self-pay | Admitting: Podiatry

## 2018-05-11 VITALS — BP 132/72 | HR 83 | Temp 98.5°F | Resp 15 | Ht 66.0 in | Wt 163.8 lb

## 2018-05-11 DIAGNOSIS — I10 Essential (primary) hypertension: Secondary | ICD-10-CM | POA: Diagnosis not present

## 2018-05-11 DIAGNOSIS — R399 Unspecified symptoms and signs involving the genitourinary system: Secondary | ICD-10-CM | POA: Diagnosis not present

## 2018-05-11 DIAGNOSIS — E118 Type 2 diabetes mellitus with unspecified complications: Secondary | ICD-10-CM

## 2018-05-11 DIAGNOSIS — R8290 Unspecified abnormal findings in urine: Secondary | ICD-10-CM

## 2018-05-11 DIAGNOSIS — B351 Tinea unguium: Secondary | ICD-10-CM

## 2018-05-11 DIAGNOSIS — M79674 Pain in right toe(s): Secondary | ICD-10-CM | POA: Diagnosis not present

## 2018-05-11 DIAGNOSIS — M79675 Pain in left toe(s): Secondary | ICD-10-CM

## 2018-05-11 LAB — POCT URINALYSIS DIPSTICK
BILIRUBIN UA: NEGATIVE
Glucose, UA: NEGATIVE
KETONES UA: NEGATIVE
NITRITE UA: POSITIVE
PROTEIN UA: POSITIVE — AB
RBC UA: NEGATIVE
Spec Grav, UA: 1.01 (ref 1.010–1.025)
Urobilinogen, UA: 0.2 E.U./dL
pH, UA: 5 (ref 5.0–8.0)

## 2018-05-11 NOTE — Progress Notes (Signed)
Patient presents to clinic today c/o for follow-up of BP and pedal edema. At last visit, amlodipine was held due to low resting BP in a patient at very high risk of falls, as well as potentially causing pedal edema. They have stopped the medication as directed. Note BP levels have been in the normal range 161-096 systolic. Patient and caregivers deny complaints of chest pain, palpitations, lightheadedness, dizziness, vision changes or frequent headaches. Glucose levels have been running 150-180. They are trying to keep his diet consistent.  Wife (primary caregiver) notes significant clouding of patient's urine. She notes he had a loose stool several days ago and some of the stool got onto his penis and catheter. She is concerned about the possibility of a UTI giving his significant history. Are not able to see the Urologist for 2 more weeks. Denies fever, chills, malaise or fatigue. Denies change in mentation from patient's baseline.   Past Medical History:  Diagnosis Date  . A-fib ([place])   . C. difficile diarrhea   . Cataracts, bilateral   . Chronic dermatitis   . Diabetes type 2, controlled ([place]) 2000  . History of chicken pox   . Mumps    Adult  . Retinopathy   . Shingles   . Stroke [facility]) Nov 2011  . Undescended testicle, unilateral    Right  . Whooping cough     Current Outpatient Medications on File Prior to Visit  Medication Sig Dispense Refill  . apixaban (ELIQUIS) 2.5 MG TABS tablet Take 1 tablet (2.5 mg total) by mouth 2 (two) times daily. 10 tablet 0  . Calcium Carbonate Antacid (ALKA-SELTZER ANTACID PO) Take 2 tablets by mouth daily as needed (for indegestion).     . Cranberry 200 MG CAPS Take 2 capsules 2 (two) times daily by mouth.    . cyanocobalamin (,VITAMIN B-12,) 1000 MCG/ML injection Inject 1 mL (1,000 mcg total) into the muscle every 30 (thirty) days. 1 mL 12  . docusate sodium (COLACE) 100 MG capsule Take 200 mg by mouth daily as needed for mild constipation.       Marland Kitchen EASY TOUCH LANCETS 30G MISC USE 1 LANCETS TO CHECK BLOOD SUGAR  ONCE DAILY  DX:E11.8 100 each 12  . ELDERBERRY PO Take 1 tablet by mouth as needed.     Marland Kitchen glipiZIDE (GLUCOTROL) 5 MG tablet Take 0.5 tablets (2.5 mg total) by mouth daily before breakfast. (Patient taking differently: Take 5 mg by mouth daily before breakfast. ) 45 tablet 1  . glucose blood test strip One Touch Verio strips Use as instructed to check fasting sugars once daily .Dx:E11.9 100 each 12  . ketoconazole (NIZORAL) 2 % shampoo     . levothyroxine (SYNTHROID, LEVOTHROID) 50 MCG tablet Take 1 tablet (50 mcg total) by mouth daily. 90 tablet 1  . Miconazole Nitrate (TRIPLE PASTE AF) 2 % OINT Apply 1 application topically daily as needed (jock itch).     . Multiple Vitamins-Minerals (ICAPS AREDS 2 PO) Take 2 capsules by mouth daily.    . mupirocin ointment (BACTROBAN) 2 % Place 1 application into the nose 2 (two) times daily.    . Omega-3 Fatty Acids (FISH OIL) 1000 MG CAPS Take 1 capsule 2 (two) times daily by mouth.    . Probiotic Product (PROBIOTIC-10 PO) Take 3 capsules daily by mouth.    Marland Kitchen amiodarone (PACERONE) 200 MG tablet Take 1 tablet (200 mg total) by mouth 2 (two) times daily. Discontinue after 04/23/17 doses. (Patient not  taking: Reported on 10/15/2017)    . amLODipine (NORVASC) 10 MG tablet Take 1 tablet by mouth daily.    . sodium bicarbonate 650 MG tablet Take 650 mg by mouth 2 (two) times daily. for 10 days  0  . vancomycin (VANCOCIN) 125 MG capsule TAKE 1 CAPSULE BY MOUTH 4 TIMES DAILY FOR 7 DAYS  0   No current facility-administered medications on file prior to visit.     Allergies  Allergen Reactions  . Pneumococcal Vaccine Rash  . Shellfish Allergy Anaphylaxis  . Tamsulosin Hcl Other (See Comments)    Hypotension Hypotension  . Aspirin-Dipyridamole Er Other (See Comments)    Headaches   . Other Other (See Comments)    Blood Thinner given in Rehab gave H/As - Not Coumadin/Headaches      Family  History  Problem Relation Age of Onset  . Pneumonia Mother 46       Deceased  . GI Bleed Father 69       Deceased - Ulcers  . Diabetes Cousin   . Diabetes Brother   . Cancer Paternal Aunt     Social History   Socioeconomic History  . Marital status: Married    Spouse name: Not on file  . Number of children: Not on file  . Years of education: Not on file  . Highest education level: Not on file  Occupational History  . Not on file  Social Needs  . Financial resource strain: Not on file  . Food insecurity:    Worry: Not on file    Inability: Not on file  . Transportation needs:    Medical: Not on file    Non-medical: Not on file  Tobacco Use  . Smoking status: Former Research scientist (life sciences)  . Smokeless tobacco: Never Used  . Tobacco comment: Hasn't smoked since age 10  Substance and Sexual Activity  . Alcohol use: No    Alcohol/week: 0.0 standard drinks  . Drug use: No  . Sexual activity: Never  Lifestyle  . Physical activity:    Days per week: Not on file    Minutes per session: Not on file  . Stress: Not on file  Relationships  . Social connections:    Talks on phone: Not on file    Gets together: Not on file    Attends religious service: Not on file    Active member of club or organization: Not on file    Attends meetings of clubs or organizations: Not on file    Relationship status: Not on file  Other Topics Concern  . Not on file  Social History Narrative   Lives with wife in a one story home.  Has 1 child.  Retired Armed forces technical officer.  Education: Oceanographer.     Review of Systems - See HPI.  All other ROS are negative.  BP 132/72   Pulse 83   Temp 98.5 F (36.9 C) (Oral)   Resp 15   Ht 5\' 6"  (1.676 m)   Wt 163 lb 12.8 oz (74.3 kg)   SpO2 96%   BMI 26.44 kg/m   Physical Exam  Constitutional: He is oriented to person, place, and time. He appears well-developed and well-nourished.  HENT:  Head: Normocephalic and atraumatic.  Eyes: Pupils are equal, round, and  reactive to light. Conjunctivae are normal.  Neck: Neck supple.  Cardiovascular: Normal rate, regular rhythm, normal heart sounds and intact distal pulses.  Pulmonary/Chest: Effort normal and breath sounds normal. No stridor. No respiratory  distress. He has no wheezes. He has no rales. He exhibits no tenderness.  Abdominal: Soft. Bowel sounds are normal. He exhibits no distension. There is no tenderness.  Genitourinary:  Genitourinary Comments: Foley cath in place with good drainage. Urine in foley bag is yellow with some moderate clouding noted.  Neurological: He is alert and oriented to person, place, and time.  Psychiatric: He has a normal mood and affect.  Vitals reviewed.   Recent Results (from the past 2160 hour(s))  CBC with Differential/Platelet     Status: Abnormal   Collection Time: 03/12/18 10:33 AM  Result Value Ref Range   WBC 9.6 4.0 - 10.5 K/uL   RBC 3.98 (L) 4.22 - 5.81 Mil/uL   Hemoglobin 11.6 (L) 13.0 - 17.0 g/dL   HCT 34.1 (L) 39.0 - 52.0 %   MCV 85.6 78.0 - 100.0 fl   MCHC 34.1 30.0 - 36.0 g/dL   RDW 14.3 11.5 - 15.5 %   Platelets 245.0 150.0 - 400.0 K/uL   Neutrophils Relative % 59.1 43.0 - 77.0 %   Lymphocytes Relative 23.2 12.0 - 46.0 %   Monocytes Relative 8.5 3.0 - 12.0 %   Eosinophils Relative 8.2 (H) 0.0 - 5.0 %   Basophils Relative 1.0 0.0 - 3.0 %   Neutro Abs 5.7 1.4 - 7.7 K/uL   Lymphs Abs 2.2 0.7 - 4.0 K/uL   Monocytes Absolute 0.8 0.1 - 1.0 K/uL   Eosinophils Absolute 0.8 (H) 0.0 - 0.7 K/uL   Basophils Absolute 0.1 0.0 - 0.1 K/uL  Lipid panel     Status: Abnormal   Collection Time: 03/12/18 10:33 AM  Result Value Ref Range   Cholesterol 186 0 - 200 mg/dL    Comment: ATP III Classification       Desirable:  < 200 mg/dL               Borderline High:  200 - 239 mg/dL          High:  > = 240 mg/dL   Triglycerides 159.0 (H) 0.0 - 149.0 mg/dL    Comment: Normal:  <150 mg/dLBorderline High:  150 - 199 mg/dL   HDL 35.90 (L) >39.00 mg/dL   VLDL 31.8  0.0 - 40.0 mg/dL   LDL Cholesterol 118 (H) 0 - 99 mg/dL   Total CHOL/HDL Ratio 5     Comment:                Men          Women1/2 Average Risk     3.4          3.3Average Risk          5.0          4.42X Average Risk          9.6          7.13X Average Risk          15.0          11.0                       NonHDL 149.96     Comment: NOTE:  Non-HDL goal should be 30 mg/dL higher than patient's LDL goal (i.e. LDL goal of < 70 mg/dL, would have non-HDL goal of < 100 mg/dL)  Hemoglobin A1c     Status: Abnormal   Collection Time: 03/12/18 10:33 AM  Result Value Ref Range   Hgb A1c MFr  Bld 7.9 (H) 4.6 - 6.5 %    Comment: Glycemic Control Guidelines for People with Diabetes:Non Diabetic:  <6%Goal of Therapy: <7%Additional Action Suggested:  >8%   Comprehensive metabolic panel     Status: Abnormal   Collection Time: 03/12/18 10:33 AM  Result Value Ref Range   Sodium 133 (L) 135 - 145 mEq/L   Potassium 4.5 3.5 - 5.1 mEq/L   Chloride 101 96 - 112 mEq/L   CO2 23 19 - 32 mEq/L   Glucose, Bld 206 (H) 70 - 99 mg/dL   BUN 30 (H) 6 - 23 mg/dL   Creatinine, Ser 1.74 (H) 0.40 - 1.50 mg/dL   Total Bilirubin 0.4 0.2 - 1.2 mg/dL   Alkaline Phosphatase 62 39 - 117 U/L   AST 11 0 - 37 U/L   ALT 10 0 - 53 U/L   Total Protein 7.1 6.0 - 8.3 g/dL   Albumin 4.0 3.5 - 5.2 g/dL   Calcium 9.6 8.4 - 10.5 mg/dL   GFR 39.17 (L) >60.00 mL/min  Vitamin D (25 hydroxy)     Status: None   Collection Time: 03/12/18 10:33 AM  Result Value Ref Range   VITD 64.36 30.00 - 100.00 ng/mL  B12     Status: Abnormal   Collection Time: 03/12/18 10:33 AM  Result Value Ref Range   Vitamin B-12 1,373 (H) 211 - 911 pg/mL  Basic metabolic panel     Status: Abnormal   Collection Time: 03/30/18 11:17 AM  Result Value Ref Range   Sodium 134 (L) 135 - 145 mEq/L   Potassium 4.2 3.5 - 5.1 mEq/L   Chloride 101 96 - 112 mEq/L   CO2 23 19 - 32 mEq/L   Glucose, Bld 225 (H) 70 - 99 mg/dL   BUN 23 6 - 23 mg/dL   Creatinine, Ser 1.61 (H)  0.40 - 1.50 mg/dL   Calcium 9.3 8.4 - 10.5 mg/dL   GFR 42.84 (L) >60.00 mL/min  CBC with Differential/Platelet     Status: Abnormal   Collection Time: 05/01/18  2:00 PM  Result Value Ref Range   WBC 12.4 (H) 4.0 - 10.5 K/uL   RBC 4.13 (L) 4.22 - 5.81 Mil/uL   Hemoglobin 11.9 (L) 13.0 - 17.0 g/dL   HCT 34.9 (L) 39.0 - 52.0 %   MCV 84.6 78.0 - 100.0 fl   MCHC 34.1 30.0 - 36.0 g/dL   RDW 14.2 11.5 - 15.5 %   Platelets 322.0 150.0 - 400.0 K/uL   Neutrophils Relative % 67.1 43.0 - 77.0 %   Lymphocytes Relative 20.2 12.0 - 46.0 %   Monocytes Relative 5.4 3.0 - 12.0 %   Eosinophils Relative 6.6 (H) 0.0 - 5.0 %   Basophils Relative 0.7 0.0 - 3.0 %   Neutro Abs 8.3 (H) 1.4 - 7.7 K/uL   Lymphs Abs 2.5 0.7 - 4.0 K/uL   Monocytes Absolute 0.7 0.1 - 1.0 K/uL   Eosinophils Absolute 0.8 (H) 0.0 - 0.7 K/uL   Basophils Absolute 0.1 0.0 - 0.1 K/uL  Comprehensive metabolic panel     Status: Abnormal   Collection Time: 05/01/18  2:00 PM  Result Value Ref Range   Sodium 133 (L) 135 - 145 mEq/L   Potassium 4.1 3.5 - 5.1 mEq/L   Chloride 99 96 - 112 mEq/L   CO2 24 19 - 32 mEq/L   Glucose, Bld 213 (H) 70 - 99 mg/dL   BUN 22 6 - 23 mg/dL  Creatinine, Ser 1.74 (H) 0.40 - 1.50 mg/dL   Total Bilirubin 0.4 0.2 - 1.2 mg/dL   Alkaline Phosphatase 65 39 - 117 U/L   AST 15 0 - 37 U/L   ALT 12 0 - 53 U/L   Total Protein 7.5 6.0 - 8.3 g/dL   Albumin 4.1 3.5 - 5.2 g/dL   Calcium 10.0 8.4 - 10.5 mg/dL   GFR 39.16 (L) >60.00 mL/min   Assessment/Plan: 1. Cloudy urine Urine dip + LE and Nitrites. Will send for culture. + history of c. Diff so will avoid penicillin. Will start oral Keflex 250 mg BID until culture results. Will alter treatment regimen based on results. Supportive measures reviewed. Follow-up with Urology as scheduled. - Urine Culture - POCT urinalysis dipstick  2. Essential hypertension BP stable today. Pedal edema has resolved. Will fully discontinue amlodipine today.  3. Diabetes  mellitus with complication (HCC) Glucose levels are improving. Needs to schedule follow-up with nephrology. Continue current regimen.   Leeanne Rio, PA-C

## 2018-05-11 NOTE — Patient Instructions (Signed)
Please keep hydrated and get plenty of rest.  I will call with urine culture results and we will decide on treatment at that time.  As long as we keep glucose levels < 200, I am ok with this giving Gerald Holt's advanced age.  Follow-up with Urology as scheduled.

## 2018-05-12 ENCOUNTER — Ambulatory Visit: Payer: Medicare Other | Admitting: Physician Assistant

## 2018-05-12 ENCOUNTER — Other Ambulatory Visit: Payer: Self-pay | Admitting: Physician Assistant

## 2018-05-12 DIAGNOSIS — I129 Hypertensive chronic kidney disease with stage 1 through stage 4 chronic kidney disease, or unspecified chronic kidney disease: Secondary | ICD-10-CM | POA: Diagnosis not present

## 2018-05-12 DIAGNOSIS — N183 Chronic kidney disease, stage 3 (moderate): Secondary | ICD-10-CM | POA: Diagnosis not present

## 2018-05-12 DIAGNOSIS — R1312 Dysphagia, oropharyngeal phase: Secondary | ICD-10-CM | POA: Diagnosis not present

## 2018-05-12 DIAGNOSIS — J690 Pneumonitis due to inhalation of food and vomit: Secondary | ICD-10-CM | POA: Diagnosis not present

## 2018-05-12 DIAGNOSIS — E1122 Type 2 diabetes mellitus with diabetic chronic kidney disease: Secondary | ICD-10-CM | POA: Diagnosis not present

## 2018-05-12 DIAGNOSIS — I69854 Hemiplegia and hemiparesis following other cerebrovascular disease affecting left non-dominant side: Secondary | ICD-10-CM | POA: Diagnosis not present

## 2018-05-12 MED ORDER — CEPHALEXIN 250 MG PO CAPS: 250.0000 mg | ORAL_CAPSULE | Freq: Two times a day (BID) | ORAL | 0 refills | Status: AC

## 2018-05-13 DIAGNOSIS — J690 Pneumonitis due to inhalation of food and vomit: Secondary | ICD-10-CM | POA: Diagnosis not present

## 2018-05-13 DIAGNOSIS — I69854 Hemiplegia and hemiparesis following other cerebrovascular disease affecting left non-dominant side: Secondary | ICD-10-CM | POA: Diagnosis not present

## 2018-05-13 DIAGNOSIS — E1122 Type 2 diabetes mellitus with diabetic chronic kidney disease: Secondary | ICD-10-CM | POA: Diagnosis not present

## 2018-05-13 DIAGNOSIS — R1312 Dysphagia, oropharyngeal phase: Secondary | ICD-10-CM | POA: Diagnosis not present

## 2018-05-13 DIAGNOSIS — N183 Chronic kidney disease, stage 3 (moderate): Secondary | ICD-10-CM | POA: Diagnosis not present

## 2018-05-13 DIAGNOSIS — I129 Hypertensive chronic kidney disease with stage 1 through stage 4 chronic kidney disease, or unspecified chronic kidney disease: Secondary | ICD-10-CM | POA: Diagnosis not present

## 2018-05-13 LAB — URINE CULTURE
MICRO NUMBER:: 90986011
SPECIMEN QUALITY: ADEQUATE

## 2018-05-13 NOTE — Progress Notes (Signed)
Subjective: 84 y.o. returns the office today for painful, elongated, thickened toenails which he cannot trim himself. Denies any redness or drainage around the nails. Denies any acute changes since last appointment and no new complaints today. Denies any systemic complaints such as fevers, chills, nausea, vomiting.   Objective: Presents with caregiver; in wheelchair DP/PT pulses palpable 1/4, CRT less than 3 seconds Nails hypertrophic, dystrophic, elongated, brittle, discolored 10. There is incurvation along the medial and lateral hallux toenails. There is tenderness overlying the nails 1-5 bilaterally. There is no surrounding erythema or drainage along the nail sites.  There is no area of skin breakdown, open sores No other areas of tenderness bilateral lower extremities. No overlying edema, erythema, increased warmth. No pain with calf compression, swelling, warmth, erythema.  Assessment: Patient presents with symptomatic onychomycosis  Plan: -Treatment options including alternatives, risks, complications were discussed -Nails sharply debrided 10 without complication/bleeding. -Discussed daily foot inspection. If there are any changes, to call the office immediately.  -Follow-up in 3 months or sooner if any problems are to arise. In the meantime, encouraged to call the office with any questions, concerns, changes symptoms.  Celesta Gentile, DPM

## 2018-05-14 ENCOUNTER — Encounter: Payer: Self-pay | Admitting: Physician Assistant

## 2018-05-15 DIAGNOSIS — E1122 Type 2 diabetes mellitus with diabetic chronic kidney disease: Secondary | ICD-10-CM | POA: Diagnosis not present

## 2018-05-15 DIAGNOSIS — J690 Pneumonitis due to inhalation of food and vomit: Secondary | ICD-10-CM | POA: Diagnosis not present

## 2018-05-15 DIAGNOSIS — R1312 Dysphagia, oropharyngeal phase: Secondary | ICD-10-CM | POA: Diagnosis not present

## 2018-05-15 DIAGNOSIS — I69854 Hemiplegia and hemiparesis following other cerebrovascular disease affecting left non-dominant side: Secondary | ICD-10-CM | POA: Diagnosis not present

## 2018-05-15 DIAGNOSIS — I129 Hypertensive chronic kidney disease with stage 1 through stage 4 chronic kidney disease, or unspecified chronic kidney disease: Secondary | ICD-10-CM | POA: Diagnosis not present

## 2018-05-15 DIAGNOSIS — N183 Chronic kidney disease, stage 3 (moderate): Secondary | ICD-10-CM | POA: Diagnosis not present

## 2018-05-18 DIAGNOSIS — I69854 Hemiplegia and hemiparesis following other cerebrovascular disease affecting left non-dominant side: Secondary | ICD-10-CM | POA: Diagnosis not present

## 2018-05-18 DIAGNOSIS — E1122 Type 2 diabetes mellitus with diabetic chronic kidney disease: Secondary | ICD-10-CM | POA: Diagnosis not present

## 2018-05-18 DIAGNOSIS — J690 Pneumonitis due to inhalation of food and vomit: Secondary | ICD-10-CM | POA: Diagnosis not present

## 2018-05-18 DIAGNOSIS — I129 Hypertensive chronic kidney disease with stage 1 through stage 4 chronic kidney disease, or unspecified chronic kidney disease: Secondary | ICD-10-CM | POA: Diagnosis not present

## 2018-05-18 DIAGNOSIS — Z87440 Personal history of urinary (tract) infections: Secondary | ICD-10-CM | POA: Diagnosis not present

## 2018-05-18 DIAGNOSIS — R1312 Dysphagia, oropharyngeal phase: Secondary | ICD-10-CM | POA: Diagnosis not present

## 2018-05-18 DIAGNOSIS — N183 Chronic kidney disease, stage 3 (moderate): Secondary | ICD-10-CM | POA: Diagnosis not present

## 2018-05-18 DIAGNOSIS — R339 Retention of urine, unspecified: Secondary | ICD-10-CM | POA: Diagnosis not present

## 2018-05-20 DIAGNOSIS — N183 Chronic kidney disease, stage 3 (moderate): Secondary | ICD-10-CM | POA: Diagnosis not present

## 2018-05-20 DIAGNOSIS — I129 Hypertensive chronic kidney disease with stage 1 through stage 4 chronic kidney disease, or unspecified chronic kidney disease: Secondary | ICD-10-CM | POA: Diagnosis not present

## 2018-05-20 DIAGNOSIS — I69854 Hemiplegia and hemiparesis following other cerebrovascular disease affecting left non-dominant side: Secondary | ICD-10-CM | POA: Diagnosis not present

## 2018-05-20 DIAGNOSIS — E1122 Type 2 diabetes mellitus with diabetic chronic kidney disease: Secondary | ICD-10-CM | POA: Diagnosis not present

## 2018-05-20 DIAGNOSIS — J690 Pneumonitis due to inhalation of food and vomit: Secondary | ICD-10-CM | POA: Diagnosis not present

## 2018-05-20 DIAGNOSIS — R1312 Dysphagia, oropharyngeal phase: Secondary | ICD-10-CM | POA: Diagnosis not present

## 2018-05-21 DIAGNOSIS — E1122 Type 2 diabetes mellitus with diabetic chronic kidney disease: Secondary | ICD-10-CM | POA: Diagnosis not present

## 2018-05-21 DIAGNOSIS — R1312 Dysphagia, oropharyngeal phase: Secondary | ICD-10-CM | POA: Diagnosis not present

## 2018-05-21 DIAGNOSIS — I69854 Hemiplegia and hemiparesis following other cerebrovascular disease affecting left non-dominant side: Secondary | ICD-10-CM | POA: Diagnosis not present

## 2018-05-21 DIAGNOSIS — J690 Pneumonitis due to inhalation of food and vomit: Secondary | ICD-10-CM | POA: Diagnosis not present

## 2018-05-21 DIAGNOSIS — I129 Hypertensive chronic kidney disease with stage 1 through stage 4 chronic kidney disease, or unspecified chronic kidney disease: Secondary | ICD-10-CM | POA: Diagnosis not present

## 2018-05-21 DIAGNOSIS — N183 Chronic kidney disease, stage 3 (moderate): Secondary | ICD-10-CM | POA: Diagnosis not present

## 2018-05-22 DIAGNOSIS — I129 Hypertensive chronic kidney disease with stage 1 through stage 4 chronic kidney disease, or unspecified chronic kidney disease: Secondary | ICD-10-CM | POA: Diagnosis not present

## 2018-05-22 DIAGNOSIS — I69854 Hemiplegia and hemiparesis following other cerebrovascular disease affecting left non-dominant side: Secondary | ICD-10-CM | POA: Diagnosis not present

## 2018-05-22 DIAGNOSIS — N183 Chronic kidney disease, stage 3 (moderate): Secondary | ICD-10-CM | POA: Diagnosis not present

## 2018-05-22 DIAGNOSIS — R1312 Dysphagia, oropharyngeal phase: Secondary | ICD-10-CM | POA: Diagnosis not present

## 2018-05-22 DIAGNOSIS — J690 Pneumonitis due to inhalation of food and vomit: Secondary | ICD-10-CM | POA: Diagnosis not present

## 2018-05-22 DIAGNOSIS — E1122 Type 2 diabetes mellitus with diabetic chronic kidney disease: Secondary | ICD-10-CM | POA: Diagnosis not present

## 2018-05-27 DIAGNOSIS — R1312 Dysphagia, oropharyngeal phase: Secondary | ICD-10-CM | POA: Diagnosis not present

## 2018-05-27 DIAGNOSIS — E1122 Type 2 diabetes mellitus with diabetic chronic kidney disease: Secondary | ICD-10-CM | POA: Diagnosis not present

## 2018-05-27 DIAGNOSIS — N183 Chronic kidney disease, stage 3 (moderate): Secondary | ICD-10-CM | POA: Diagnosis not present

## 2018-05-27 DIAGNOSIS — J690 Pneumonitis due to inhalation of food and vomit: Secondary | ICD-10-CM | POA: Diagnosis not present

## 2018-05-27 DIAGNOSIS — I129 Hypertensive chronic kidney disease with stage 1 through stage 4 chronic kidney disease, or unspecified chronic kidney disease: Secondary | ICD-10-CM | POA: Diagnosis not present

## 2018-05-27 DIAGNOSIS — I69854 Hemiplegia and hemiparesis following other cerebrovascular disease affecting left non-dominant side: Secondary | ICD-10-CM | POA: Diagnosis not present

## 2018-05-28 ENCOUNTER — Telehealth: Payer: Self-pay | Admitting: Emergency Medicine

## 2018-05-28 DIAGNOSIS — I69854 Hemiplegia and hemiparesis following other cerebrovascular disease affecting left non-dominant side: Secondary | ICD-10-CM | POA: Diagnosis not present

## 2018-05-28 DIAGNOSIS — R1312 Dysphagia, oropharyngeal phase: Secondary | ICD-10-CM | POA: Diagnosis not present

## 2018-05-28 DIAGNOSIS — I129 Hypertensive chronic kidney disease with stage 1 through stage 4 chronic kidney disease, or unspecified chronic kidney disease: Secondary | ICD-10-CM | POA: Diagnosis not present

## 2018-05-28 DIAGNOSIS — E1122 Type 2 diabetes mellitus with diabetic chronic kidney disease: Secondary | ICD-10-CM | POA: Diagnosis not present

## 2018-05-28 DIAGNOSIS — J690 Pneumonitis due to inhalation of food and vomit: Secondary | ICD-10-CM | POA: Diagnosis not present

## 2018-05-28 DIAGNOSIS — N183 Chronic kidney disease, stage 3 (moderate): Secondary | ICD-10-CM | POA: Diagnosis not present

## 2018-05-28 NOTE — Telephone Encounter (Signed)
Copied from [place] [id number]. Topic: General - Other >> May 28, 2018  2:36 PM Adelene Idler wrote: Patty with [facility] called to report missed nurse visit this week for pt

## 2018-05-29 DIAGNOSIS — I129 Hypertensive chronic kidney disease with stage 1 through stage 4 chronic kidney disease, or unspecified chronic kidney disease: Secondary | ICD-10-CM | POA: Diagnosis not present

## 2018-05-29 DIAGNOSIS — R1312 Dysphagia, oropharyngeal phase: Secondary | ICD-10-CM | POA: Diagnosis not present

## 2018-05-29 DIAGNOSIS — I69854 Hemiplegia and hemiparesis following other cerebrovascular disease affecting left non-dominant side: Secondary | ICD-10-CM | POA: Diagnosis not present

## 2018-05-29 DIAGNOSIS — J690 Pneumonitis due to inhalation of food and vomit: Secondary | ICD-10-CM | POA: Diagnosis not present

## 2018-05-29 DIAGNOSIS — E1122 Type 2 diabetes mellitus with diabetic chronic kidney disease: Secondary | ICD-10-CM | POA: Diagnosis not present

## 2018-05-29 DIAGNOSIS — N183 Chronic kidney disease, stage 3 (moderate): Secondary | ICD-10-CM | POA: Diagnosis not present

## 2018-06-01 ENCOUNTER — Telehealth: Payer: Self-pay | Admitting: Physician Assistant

## 2018-06-01 DIAGNOSIS — Z87440 Personal history of urinary (tract) infections: Secondary | ICD-10-CM

## 2018-06-01 DIAGNOSIS — R3989 Other symptoms and signs involving the genitourinary system: Secondary | ICD-10-CM

## 2018-06-01 NOTE — Telephone Encounter (Signed)
Spoke with patient spouse Geryl Rankins, PT Cecilie Lowers was at the home 2 hrs ago. She did recheck BP while on the phone. Repeat bp was 156/73 She did state when they saw the Urologist that following Monday he rechecked a urine culture and the same bacteria was present. Was given a 5 days oral Keflex.  She states he finished the abx on Wed and recently started being lethargic and having cloudy urine the last 3 days.  BS are improving. Running 150-175. She is agreeable with orders faxed to [facility] for urine specimen and culture.

## 2018-06-01 NOTE — Telephone Encounter (Signed)
Please advise 

## 2018-06-01 NOTE — Addendum Note (Signed)
Addended by: Leonidas Romberg on: 06/01/2018 04:42 PM   Modules accepted: Orders

## 2018-06-01 NOTE — Telephone Encounter (Signed)
Copied from [place] [id number]. Topic: Inquiry >> Jun 01, 2018  1:38 PM Margot Ables wrote: Reason for CRM: Pt is having PT today 160/90, no fever, pt is slightly lethargic, urine is cloudy and concerned for UTI, pt has history of stroke. No headache, sob, difficulty breathing, or sweating. No odor with urine. Please call to advise.  Advised by NT to route to provider office.

## 2018-06-01 NOTE — Telephone Encounter (Signed)
Ok to send orders. She needs to contact the Urologist that they just saw and discuss ongoing symptoms. He may want to see [place] again.

## 2018-06-01 NOTE — Telephone Encounter (Signed)
Recheck BP at home when resting to see if improved.  Have [facility] RN collect urine specimen for UA and culture.   They are  welcome to schedule an appointment for in-office assessment today or in the morning if no worsening symptoms.   ER for any worsening of symptoms while waiting on UA.

## 2018-06-02 ENCOUNTER — Ambulatory Visit (INDEPENDENT_AMBULATORY_CARE_PROVIDER_SITE_OTHER): Payer: Medicare Other | Admitting: Physician Assistant

## 2018-06-02 ENCOUNTER — Other Ambulatory Visit: Payer: Self-pay

## 2018-06-02 ENCOUNTER — Encounter: Payer: Self-pay | Admitting: Physician Assistant

## 2018-06-02 VITALS — BP 146/94 | HR 71 | Temp 97.5°F | Resp 15 | Ht 66.0 in | Wt 165.2 lb

## 2018-06-02 DIAGNOSIS — I10 Essential (primary) hypertension: Secondary | ICD-10-CM | POA: Diagnosis not present

## 2018-06-02 DIAGNOSIS — I69854 Hemiplegia and hemiparesis following other cerebrovascular disease affecting left non-dominant side: Secondary | ICD-10-CM | POA: Diagnosis not present

## 2018-06-02 DIAGNOSIS — N183 Chronic kidney disease, stage 3 unspecified: Secondary | ICD-10-CM

## 2018-06-02 DIAGNOSIS — R8290 Unspecified abnormal findings in urine: Secondary | ICD-10-CM | POA: Diagnosis not present

## 2018-06-02 DIAGNOSIS — R1312 Dysphagia, oropharyngeal phase: Secondary | ICD-10-CM | POA: Diagnosis not present

## 2018-06-02 DIAGNOSIS — E1122 Type 2 diabetes mellitus with diabetic chronic kidney disease: Secondary | ICD-10-CM | POA: Diagnosis not present

## 2018-06-02 DIAGNOSIS — I129 Hypertensive chronic kidney disease with stage 1 through stage 4 chronic kidney disease, or unspecified chronic kidney disease: Secondary | ICD-10-CM | POA: Diagnosis not present

## 2018-06-02 DIAGNOSIS — J690 Pneumonitis due to inhalation of food and vomit: Secondary | ICD-10-CM | POA: Diagnosis not present

## 2018-06-02 LAB — URINALYSIS, ROUTINE W REFLEX MICROSCOPIC
BILIRUBIN URINE: NEGATIVE
HGB URINE DIPSTICK: NEGATIVE
Ketones, ur: NEGATIVE
NITRITE: NEGATIVE
RBC / HPF: NONE SEEN (ref 0–?)
Specific Gravity, Urine: <=1.005 — AB (ref 1.000–1.030)
TOTAL PROTEIN, URINE-UPE24: NEGATIVE
Urine Glucose: NEGATIVE
Urobilinogen, UA: 0.2 (ref 0.0–1.0)
pH: 6 (ref 5.0–8.0)

## 2018-06-02 LAB — BASIC METABOLIC PANEL
BUN: 29 mg/dL — AB (ref 6–23)
CHLORIDE: 100 meq/L (ref 96–112)
CO2: 27 mEq/L (ref 19–32)
CREATININE: 1.69 mg/dL — AB (ref 0.40–1.50)
Calcium: 9.5 mg/dL (ref 8.4–10.5)
GFR: 40.49 mL/min — AB (ref >60.00)
GLUCOSE: 138 mg/dL — AB (ref 70–99)
POTASSIUM: 3.9 meq/L (ref 3.5–5.1)
Sodium: 135 mEq/L (ref 135–145)

## 2018-06-02 MED ORDER — AMLODIPINE BESYLATE 2.5 MG PO TABS: 2.5000 mg | ORAL_TABLET | Freq: Every day | ORAL | 3 refills | Status: AC

## 2018-06-02 NOTE — Patient Instructions (Signed)
Please go to the lab today for blood work.  I will call you with your results. We will alter treatment regimen(s) if indicated by your results.   We will wait on urine culture before restarting antibiotics.   Start the Norvasc, taking as directed. Limit salt intake.  Follow-up 1 week for reassessment. Keep record of home BP measurements.

## 2018-06-02 NOTE — Progress Notes (Signed)
Patient presents to clinic today with caregiver noting elevated BP measurements home over the past week. Notes elevation as high as 160s/100 during [facility] PT. Patient denies chest pain, palpitations, lightheadedness, dizziness, vision changes or frequent headaches. Wife/Cergiver has noted some cloudy urine again but improved today. Has recently been treated for UTI by Urology in the past week. Denies change to diet or salt intake. Was previously on amlodipine 10 mg but this was stopped due to significant hypotension.  Past Medical History:  Diagnosis Date  . A-fib ([place])   . C. difficile diarrhea   . Cataracts, bilateral   . Chronic dermatitis   . Diabetes type 2, controlled ([place]) 2000  . History of chicken pox   . Mumps    Adult  . Retinopathy   . Shingles   . Stroke [facility]) Nov 2011  . Undescended testicle, unilateral    Right  . Whooping cough     Current Outpatient Medications on File Prior to Visit  Medication Sig Dispense Refill  . apixaban (ELIQUIS) 2.5 MG TABS tablet Take 1 tablet (2.5 mg total) by mouth 2 (two) times daily. 10 tablet 0  . Calcium Carbonate Antacid (ALKA-SELTZER ANTACID PO) Take 2 tablets by mouth daily as needed (for indegestion).     . Cranberry 200 MG CAPS Take 2 capsules 2 (two) times daily by mouth.    . cyanocobalamin (,VITAMIN B-12,) 1000 MCG/ML injection Inject 1 mL (1,000 mcg total) into the muscle every 30 (thirty) days. 1 mL 12  . docusate sodium (COLACE) 100 MG capsule Take 200 mg by mouth daily as needed for mild constipation.     Marland Kitchen EASY TOUCH LANCETS 30G MISC USE 1 LANCETS TO CHECK BLOOD SUGAR  ONCE DAILY  DX:E11.8 100 each 12  . ELDERBERRY PO Take 1 tablet by mouth as needed.     Marland Kitchen glipiZIDE (GLUCOTROL) 5 MG tablet Take 0.5 tablets (2.5 mg total) by mouth daily before breakfast. (Patient taking differently: Take 5 mg by mouth daily before breakfast. ) 45 tablet 1  . glucose blood test strip One Touch Verio strips Use as instructed to check fasting  sugars once daily .Dx:E11.9 100 each 12  . ketoconazole (NIZORAL) 2 % shampoo     . levothyroxine (SYNTHROID, LEVOTHROID) 50 MCG tablet Take 1 tablet (50 mcg total) by mouth daily. 90 tablet 1  . Miconazole Nitrate (TRIPLE PASTE AF) 2 % OINT Apply 1 application topically daily as needed (jock itch).     . Multiple Vitamins-Minerals (ICAPS AREDS 2 PO) Take 2 capsules by mouth daily.    . Omega-3 Fatty Acids (FISH OIL) 1000 MG CAPS Take 1 capsule 2 (two) times daily by mouth.    . Probiotic Product (PROBIOTIC-10 PO) Take 3 capsules daily by mouth.    . sodium bicarbonate 650 MG tablet Take 650 mg by mouth 2 (two) times daily. for 10 days  0  . mupirocin ointment (BACTROBAN) 2 % Place 1 application into the nose 2 (two) times daily.     No current facility-administered medications on file prior to visit.     Allergies  Allergen Reactions  . Pneumococcal Vaccine Rash  . Shellfish Allergy Anaphylaxis  . Tamsulosin Hcl Other (See Comments)    Hypotension Hypotension  . Aspirin-Dipyridamole Er Other (See Comments)    Headaches   . Other Other (See Comments)    Blood Thinner given in Rehab gave H/As - Not Coumadin/Headaches      Family History  Problem  Relation Age of Onset  . Pneumonia Mother 41       Deceased  . GI Bleed Father 30       Deceased - Ulcers  . Diabetes Cousin   . Diabetes Brother   . Cancer Paternal Aunt     Social History   Socioeconomic History  . Marital status: Married    Spouse name: Not on file  . Number of children: Not on file  . Years of education: Not on file  . Highest education level: Not on file  Occupational History  . Not on file  Social Needs  . Financial resource strain: Not on file  . Food insecurity:    Worry: Not on file    Inability: Not on file  . Transportation needs:    Medical: Not on file    Non-medical: Not on file  Tobacco Use  . Smoking status: Former Research scientist (life sciences)  . Smokeless tobacco: Never Used  . Tobacco comment: Hasn't  smoked since age 47  Substance and Sexual Activity  . Alcohol use: No    Alcohol/week: 0.0 standard drinks  . Drug use: No  . Sexual activity: Never  Lifestyle  . Physical activity:    Days per week: Not on file    Minutes per session: Not on file  . Stress: Not on file  Relationships  . Social connections:    Talks on phone: Not on file    Gets together: Not on file    Attends religious service: Not on file    Active member of club or organization: Not on file    Attends meetings of clubs or organizations: Not on file    Relationship status: Not on file  Other Topics Concern  . Not on file  Social History Narrative   Lives with wife in a one story home.  Has 1 child.  Retired Armed forces technical officer.  Education: Oceanographer.     Review of Systems - See HPI.  All other ROS are negative.  BP (!) 148/90   Pulse 71   Temp (!) 97.5 F (36.4 C) (Oral)   Resp 15   Ht 5\' 6"  (1.676 m)   Wt 165 lb 3.2 oz (74.9 kg)   SpO2 96%   BMI 26.66 kg/m   Physical Exam  Constitutional: He is oriented to person, place, and time. He appears well-developed and well-nourished.  HENT:  Head: Normocephalic and atraumatic.  Eyes: Conjunctivae are normal.  Neck: Neck supple. No thyromegaly present.  Cardiovascular: Normal rate, regular rhythm, normal heart sounds and intact distal pulses.  Pulmonary/Chest: Breath sounds normal. No stridor. No respiratory distress. He has no wheezes. He has no rales. He exhibits no tenderness.  Lymphadenopathy:    He has no cervical adenopathy.  Neurological: He is alert and oriented to person, place, and time.  Psychiatric: He has a normal mood and affect.  Vitals reviewed.  Recent Results (from the past 2160 hour(s))  CBC with Differential/Platelet     Status: Abnormal   Collection Time: 03/12/18 10:33 AM  Result Value Ref Range   WBC 9.6 4.0 - 10.5 K/uL   RBC 3.98 (L) 4.22 - 5.81 Mil/uL   Hemoglobin 11.6 (L) 13.0 - 17.0 g/dL   HCT 34.1 (L) 39.0 - 52.0 %   MCV 85.6  78.0 - 100.0 fl   MCHC 34.1 30.0 - 36.0 g/dL   RDW 14.3 11.5 - 15.5 %   Platelets 245.0 150.0 - 400.0 K/uL   Neutrophils Relative %  59.1 43.0 - 77.0 %   Lymphocytes Relative 23.2 12.0 - 46.0 %   Monocytes Relative 8.5 3.0 - 12.0 %   Eosinophils Relative 8.2 (H) 0.0 - 5.0 %   Basophils Relative 1.0 0.0 - 3.0 %   Neutro Abs 5.7 1.4 - 7.7 K/uL   Lymphs Abs 2.2 0.7 - 4.0 K/uL   Monocytes Absolute 0.8 0.1 - 1.0 K/uL   Eosinophils Absolute 0.8 (H) 0.0 - 0.7 K/uL   Basophils Absolute 0.1 0.0 - 0.1 K/uL  Lipid panel     Status: Abnormal   Collection Time: 03/12/18 10:33 AM  Result Value Ref Range   Cholesterol 186 0 - 200 mg/dL    Comment: ATP III Classification       Desirable:  < 200 mg/dL               Borderline High:  200 - 239 mg/dL          High:  > = 240 mg/dL   Triglycerides 159.0 (H) 0.0 - 149.0 mg/dL    Comment: Normal:  <150 mg/dLBorderline High:  150 - 199 mg/dL   HDL 35.90 (L) >39.00 mg/dL   VLDL 31.8 0.0 - 40.0 mg/dL   LDL Cholesterol 118 (H) 0 - 99 mg/dL   Total CHOL/HDL Ratio 5     Comment:                Men          Women1/2 Average Risk     3.4          3.3Average Risk          5.0          4.42X Average Risk          9.6          7.13X Average Risk          15.0          11.0                       NonHDL 149.96     Comment: NOTE:  Non-HDL goal should be 30 mg/dL higher than patient's LDL goal (i.e. LDL goal of < 70 mg/dL, would have non-HDL goal of < 100 mg/dL)  Hemoglobin A1c     Status: Abnormal   Collection Time: 03/12/18 10:33 AM  Result Value Ref Range   Hgb A1c MFr Bld 7.9 (H) 4.6 - 6.5 %    Comment: Glycemic Control Guidelines for People with Diabetes:Non Diabetic:  <6%Goal of Therapy: <7%Additional Action Suggested:  >8%   Comprehensive metabolic panel     Status: Abnormal   Collection Time: 03/12/18 10:33 AM  Result Value Ref Range   Sodium 133 (L) 135 - 145 mEq/L   Potassium 4.5 3.5 - 5.1 mEq/L   Chloride 101 96 - 112 mEq/L   CO2 23 19 - 32 mEq/L    Glucose, Bld 206 (H) 70 - 99 mg/dL   BUN 30 (H) 6 - 23 mg/dL   Creatinine, Ser 1.74 (H) 0.40 - 1.50 mg/dL   Total Bilirubin 0.4 0.2 - 1.2 mg/dL   Alkaline Phosphatase 62 39 - 117 U/L   AST 11 0 - 37 U/L   ALT 10 0 - 53 U/L   Total Protein 7.1 6.0 - 8.3 g/dL   Albumin 4.0 3.5 - 5.2 g/dL   Calcium 9.6 8.4 - 10.5 mg/dL   GFR 39.17 (L) >60.00  mL/min  Vitamin D (25 hydroxy)     Status: None   Collection Time: 03/12/18 10:33 AM  Result Value Ref Range   VITD 64.36 30.00 - 100.00 ng/mL  B12     Status: Abnormal   Collection Time: 03/12/18 10:33 AM  Result Value Ref Range   Vitamin B-12 1,373 (H) 211 - 911 pg/mL  Basic metabolic panel     Status: Abnormal   Collection Time: 03/30/18 11:17 AM  Result Value Ref Range   Sodium 134 (L) 135 - 145 mEq/L   Potassium 4.2 3.5 - 5.1 mEq/L   Chloride 101 96 - 112 mEq/L   CO2 23 19 - 32 mEq/L   Glucose, Bld 225 (H) 70 - 99 mg/dL   BUN 23 6 - 23 mg/dL   Creatinine, Ser 1.61 (H) 0.40 - 1.50 mg/dL   Calcium 9.3 8.4 - 10.5 mg/dL   GFR 42.84 (L) >60.00 mL/min  CBC with Differential/Platelet     Status: Abnormal   Collection Time: 05/01/18  2:00 PM  Result Value Ref Range   WBC 12.4 (H) 4.0 - 10.5 K/uL   RBC 4.13 (L) 4.22 - 5.81 Mil/uL   Hemoglobin 11.9 (L) 13.0 - 17.0 g/dL   HCT 34.9 (L) 39.0 - 52.0 %   MCV 84.6 78.0 - 100.0 fl   MCHC 34.1 30.0 - 36.0 g/dL   RDW 14.2 11.5 - 15.5 %   Platelets 322.0 150.0 - 400.0 K/uL   Neutrophils Relative % 67.1 43.0 - 77.0 %   Lymphocytes Relative 20.2 12.0 - 46.0 %   Monocytes Relative 5.4 3.0 - 12.0 %   Eosinophils Relative 6.6 (H) 0.0 - 5.0 %   Basophils Relative 0.7 0.0 - 3.0 %   Neutro Abs 8.3 (H) 1.4 - 7.7 K/uL   Lymphs Abs 2.5 0.7 - 4.0 K/uL   Monocytes Absolute 0.7 0.1 - 1.0 K/uL   Eosinophils Absolute 0.8 (H) 0.0 - 0.7 K/uL   Basophils Absolute 0.1 0.0 - 0.1 K/uL  Comprehensive metabolic panel     Status: Abnormal   Collection Time: 05/01/18  2:00 PM  Result Value Ref Range   Sodium 133 (L)  135 - 145 mEq/L   Potassium 4.1 3.5 - 5.1 mEq/L   Chloride 99 96 - 112 mEq/L   CO2 24 19 - 32 mEq/L   Glucose, Bld 213 (H) 70 - 99 mg/dL   BUN 22 6 - 23 mg/dL   Creatinine, Ser 1.74 (H) 0.40 - 1.50 mg/dL   Total Bilirubin 0.4 0.2 - 1.2 mg/dL   Alkaline Phosphatase 65 39 - 117 U/L   AST 15 0 - 37 U/L   ALT 12 0 - 53 U/L   Total Protein 7.5 6.0 - 8.3 g/dL   Albumin 4.1 3.5 - 5.2 g/dL   Calcium 10.0 8.4 - 10.5 mg/dL   GFR 39.16 (L) >60.00 mL/min  POCT urinalysis dipstick     Status: Abnormal   Collection Time: 05/11/18  2:24 PM  Result Value Ref Range   Color, UA yellow    Clarity, UA cloudy    Glucose, UA Negative Negative   Bilirubin, UA negative    Ketones, UA negative    Spec Grav, UA 1.010 1.010 - 1.025   Blood, UA negative    pH, UA 5.0 5.0 - 8.0   Protein, UA Positive (A) Negative    Comment: trace   Urobilinogen, UA 0.2 0.2 or 1.0 E.U./dL   Nitrite, UA positive  Leukocytes, UA Large (3+) (A) Negative   Appearance     Odor    Urine Culture     Status: Abnormal   Collection Time: 05/11/18  2:30 PM  Result Value Ref Range   MICRO NUMBER: [id number]    SPECIMEN QUALITY: ADEQUATE    Sample Source NOT GIVEN    STATUS: FINAL    ISOLATE 1: Klebsiella pneumoniae (A)     Comment: Greater than 100,000 CFU/mL of Klebsiella pneumoniae      Susceptibility   Klebsiella pneumoniae - URINE CULTURE, REFLEX    AMOX/CLAVULANIC <=2 Sensitive     AMPICILLIN  Resistant     AMPICILLIN/SULBACTAM <=2 Sensitive     CEFAZOLIN* <=4 Not Reportable      * For infections other than uncomplicated UTIcaused by E. coli, K. pneumoniae or P. mirabilis:Cefazolin is resistant if MIC > or = 8 mcg/mL.(Distinguishing susceptible versus intermediatefor isolates with MIC < or = 4 mcg/mL requiresadditional testing.)For uncomplicated UTI caused by E. coli,K. pneumoniae or P. mirabilis: Cefazolin issusceptible if MIC <32 mcg/mL and predictssusceptible to the oral agents cefaclor, cefdinir,cefpodoxime,  cefprozil, cefuroxime, cephalexinand loracarbef.    CEFEPIME <=1 Sensitive     CEFTRIAXONE <=1 Sensitive     CIPROFLOXACIN <=0.25 Sensitive     LEVOFLOXACIN <=0.12 Sensitive     ERTAPENEM <=0.5 Sensitive     GENTAMICIN <=1 Sensitive     IMIPENEM <=0.25 Sensitive     NITROFURANTOIN 64 Intermediate     PIP/TAZO <=4 Sensitive     TOBRAMYCIN <=1 Sensitive     TRIMETH/SULFA* <=20 Sensitive      * For infections other than uncomplicated UTIcaused by E. coli, K. pneumoniae or P. mirabilis:Cefazolin is resistant if MIC > or = 8 mcg/mL.(Distinguishing susceptible versus intermediatefor isolates with MIC < or = 4 mcg/mL requiresadditional testing.)For uncomplicated UTI caused by E. coli,K. pneumoniae or P. mirabilis: Cefazolin issusceptible if MIC <32 mcg/mL and predictssusceptible to the oral agents cefaclor, cefdinir,cefpodoxime, cefprozil, cefuroxime, cephalexinand loracarbef.Legend:S = Susceptible  I = IntermediateR = Resistant  NS = Not susceptible* = Not tested  NR = Not reported**NN = See antimicrobic comments    Assessment/Plan: 1. Essential hypertension BP still elevated on recheck. Will restart amlodipine at 2.5 mg dose. Continue DASH diet.  - amLODipine (NORVASC) 2.5 MG tablet; Take 1 tablet (2.5 mg total) by mouth daily.  Dispense: 30 tablet; Refill: 3  2. Stage 3 chronic kidney disease (HCC) Recheck BMP today to ensure renal function is stable.  - Basic metabolic panel  3. Cloudy urine Will send for assessment giving history. Will not treat until Culture results in the absence of symptoms. - Urinalysis, Routine w reflex microscopic - Urine Culture   Leeanne Rio, PA-C

## 2018-06-03 ENCOUNTER — Telehealth: Payer: Self-pay

## 2018-06-03 NOTE — Telephone Encounter (Signed)
[place] at [phone] and spoke to [place] who transferred me to [place] and gave her the verbal orders from [place] to cancel the urinalysis and urine culture orders for patient since he was in our office yesterday, 06/02/18, and we obtained the sample at his visit. Mateo Flow verbalized an understanding and verbalized that she would cancel these orders.

## 2018-06-04 ENCOUNTER — Other Ambulatory Visit: Payer: Self-pay

## 2018-06-04 DIAGNOSIS — N183 Chronic kidney disease, stage 3 (moderate): Secondary | ICD-10-CM | POA: Diagnosis not present

## 2018-06-04 DIAGNOSIS — J690 Pneumonitis due to inhalation of food and vomit: Secondary | ICD-10-CM | POA: Diagnosis not present

## 2018-06-04 DIAGNOSIS — I69854 Hemiplegia and hemiparesis following other cerebrovascular disease affecting left non-dominant side: Secondary | ICD-10-CM | POA: Diagnosis not present

## 2018-06-04 DIAGNOSIS — R1312 Dysphagia, oropharyngeal phase: Secondary | ICD-10-CM | POA: Diagnosis not present

## 2018-06-04 DIAGNOSIS — I129 Hypertensive chronic kidney disease with stage 1 through stage 4 chronic kidney disease, or unspecified chronic kidney disease: Secondary | ICD-10-CM | POA: Diagnosis not present

## 2018-06-04 DIAGNOSIS — E1122 Type 2 diabetes mellitus with diabetic chronic kidney disease: Secondary | ICD-10-CM | POA: Diagnosis not present

## 2018-06-04 LAB — URINE CULTURE
MICRO NUMBER:: 91081884
SPECIMEN QUALITY:: ADEQUATE

## 2018-06-04 MED ORDER — CEFDINIR 300 MG PO CAPS: 300.0000 mg | ORAL_CAPSULE | Freq: Two times a day (BID) | ORAL | 0 refills | Status: DC

## 2018-06-05 ENCOUNTER — Other Ambulatory Visit: Payer: Self-pay

## 2018-06-05 MED ORDER — CEFDINIR 300 MG PO CAPS: 300.0000 mg | ORAL_CAPSULE | Freq: Two times a day (BID) | ORAL | 0 refills | Status: AC

## 2018-06-08 ENCOUNTER — Ambulatory Visit (INDEPENDENT_AMBULATORY_CARE_PROVIDER_SITE_OTHER): Payer: Medicare Other | Admitting: Physician Assistant

## 2018-06-08 ENCOUNTER — Encounter: Payer: Self-pay | Admitting: Physician Assistant

## 2018-06-08 ENCOUNTER — Other Ambulatory Visit: Payer: Self-pay

## 2018-06-08 VITALS — BP 140/76 | HR 89 | Temp 97.4°F | Resp 14 | Ht 66.0 in | Wt 164.0 lb

## 2018-06-08 DIAGNOSIS — I10 Essential (primary) hypertension: Secondary | ICD-10-CM

## 2018-06-08 NOTE — Patient Instructions (Signed)
Please increase the amlodipine to 5 mg daily. (You can given two of the 2.5 mg tablets). Continue monitoring sodium intake. Since you have an appointment with the specialist next week, call me after that appointment to give me the BP at the visit and the readings from your home BP measurements.  We will make further adjustments at that time if needed.  Finish the entire course of antibiotic.

## 2018-06-08 NOTE — Progress Notes (Signed)
Patient presents to clinic today for follow-up of hypertension after restarting amlodipine at 2.5 mg daily. Family endorses patient takes as directed. Have been checking BP on occasion with a wrist cuff. Noting BP ranging 140-160/80-90. Patient denies chest pain, palpitations, lightheadedness, dizziness, vision changes or frequent headaches.  BP Readings from Last 3 Encounters:  06/08/18 140/76  06/02/18 (!) 146/94  05/11/18 132/72   Past Medical History:  Diagnosis Date  . A-fib ([place])   . C. difficile diarrhea   . Cataracts, bilateral   . Chronic dermatitis   . Diabetes type 2, controlled ([place]) 2000  . History of chicken pox   . Mumps    Adult  . Retinopathy   . Shingles   . Stroke [facility]) Nov 2011  . Undescended testicle, unilateral    Right  . Whooping cough     Current Outpatient Medications on File Prior to Visit  Medication Sig Dispense Refill  . amLODipine (NORVASC) 2.5 MG tablet Take 1 tablet (2.5 mg total) by mouth daily. 30 tablet 3  . apixaban (ELIQUIS) 2.5 MG TABS tablet Take 1 tablet (2.5 mg total) by mouth 2 (two) times daily. 10 tablet 0  . Calcium Carbonate Antacid (ALKA-SELTZER ANTACID PO) Take 2 tablets by mouth daily as needed (for indegestion).     . cefdinir (OMNICEF) 300 MG capsule Take 1 capsule (300 mg total) by mouth 2 (two) times daily. Adding 7 days to go with original prescription. 14 capsule 0  . Cranberry 200 MG CAPS Take 2 capsules 2 (two) times daily by mouth.    . cyanocobalamin (,VITAMIN B-12,) 1000 MCG/ML injection Inject 1 mL (1,000 mcg total) into the muscle every 30 (thirty) days. 1 mL 12  . docusate sodium (COLACE) 100 MG capsule Take 200 mg by mouth daily as needed for mild constipation.     Marland Kitchen EASY TOUCH LANCETS 30G MISC USE 1 LANCETS TO CHECK BLOOD SUGAR  ONCE DAILY  DX:E11.8 100 each 12  . ELDERBERRY PO Take 1 tablet by mouth as needed.     Marland Kitchen glipiZIDE (GLUCOTROL) 5 MG tablet Take 0.5 tablets (2.5 mg total) by mouth daily before  breakfast. (Patient taking differently: Take 5 mg by mouth daily before breakfast. ) 45 tablet 1  . glucose blood test strip One Touch Verio strips Use as instructed to check fasting sugars once daily .Dx:E11.9 100 each 12  . ketoconazole (NIZORAL) 2 % shampoo     . levothyroxine (SYNTHROID, LEVOTHROID) 50 MCG tablet Take 1 tablet (50 mcg total) by mouth daily. 90 tablet 1  . Miconazole Nitrate (TRIPLE PASTE AF) 2 % OINT Apply 1 application topically daily as needed (jock itch).     . Multiple Vitamins-Minerals (ICAPS AREDS 2 PO) Take 2 capsules by mouth daily.    . Omega-3 Fatty Acids (FISH OIL) 1000 MG CAPS Take 1 capsule 2 (two) times daily by mouth.    . Probiotic Product (PROBIOTIC-10 PO) Take 3 capsules daily by mouth.     No current facility-administered medications on file prior to visit.     Allergies  Allergen Reactions  . Pneumococcal Vaccine Rash  . Shellfish Allergy Anaphylaxis  . Tamsulosin Hcl Other (See Comments)    Hypotension Hypotension  . Aspirin-Dipyridamole Er Other (See Comments)    Headaches   . Other Other (See Comments)    Blood Thinner given in Rehab gave H/As - Not Coumadin/Headaches      Family History  Problem Relation Age of Onset  .  Pneumonia Mother 74       Deceased  . GI Bleed Father 66       Deceased - Ulcers  . Diabetes Cousin   . Diabetes Brother   . Cancer Paternal Aunt     Social History   Socioeconomic History  . Marital status: Married    Spouse name: Not on file  . Number of children: Not on file  . Years of education: Not on file  . Highest education level: Not on file  Occupational History  . Not on file  Social Needs  . Financial resource strain: Not on file  . Food insecurity:    Worry: Not on file    Inability: Not on file  . Transportation needs:    Medical: Not on file    Non-medical: Not on file  Tobacco Use  . Smoking status: Former Research scientist (life sciences)  . Smokeless tobacco: Never Used  . Tobacco comment: Hasn't smoked  since age 40  Substance and Sexual Activity  . Alcohol use: No    Alcohol/week: 0.0 standard drinks  . Drug use: No  . Sexual activity: Never  Lifestyle  . Physical activity:    Days per week: Not on file    Minutes per session: Not on file  . Stress: Not on file  Relationships  . Social connections:    Talks on phone: Not on file    Gets together: Not on file    Attends religious service: Not on file    Active member of club or organization: Not on file    Attends meetings of clubs or organizations: Not on file    Relationship status: Not on file  Other Topics Concern  . Not on file  Social History Narrative   Lives with wife in a one story home.  Has 1 child.  Retired Armed forces technical officer.  Education: Oceanographer.     Review of Systems - See HPI.  All other ROS are negative.  BP 140/76   Pulse 89   Temp (!) 97.4 F (36.3 C) (Oral)   Resp 14   Ht 5\' 6"  (1.676 m)   Wt 164 lb (74.4 kg)   SpO2 97%   BMI 26.47 kg/m   Physical Exam  Constitutional: He appears well-developed and well-nourished.  HENT:  Head: Normocephalic and atraumatic.  Cardiovascular: Normal rate, regular rhythm, normal heart sounds and intact distal pulses.  Pulmonary/Chest: Effort normal and breath sounds normal. No stridor. No respiratory distress. He has no wheezes. He has no rales. He exhibits no tenderness.  Neurological: He is alert.  Psychiatric: He has a normal mood and affect.    Recent Results (from the past 2160 hour(s))  CBC with Differential/Platelet     Status: Abnormal   Collection Time: 03/12/18 10:33 AM  Result Value Ref Range   WBC 9.6 4.0 - 10.5 K/uL   RBC 3.98 (L) 4.22 - 5.81 Mil/uL   Hemoglobin 11.6 (L) 13.0 - 17.0 g/dL   HCT 34.1 (L) 39.0 - 52.0 %   MCV 85.6 78.0 - 100.0 fl   MCHC 34.1 30.0 - 36.0 g/dL   RDW 14.3 11.5 - 15.5 %   Platelets 245.0 150.0 - 400.0 K/uL   Neutrophils Relative % 59.1 43.0 - 77.0 %   Lymphocytes Relative 23.2 12.0 - 46.0 %   Monocytes Relative 8.5 3.0 -  12.0 %   Eosinophils Relative 8.2 (H) 0.0 - 5.0 %   Basophils Relative 1.0 0.0 - 3.0 %  Neutro Abs 5.7 1.4 - 7.7 K/uL   Lymphs Abs 2.2 0.7 - 4.0 K/uL   Monocytes Absolute 0.8 0.1 - 1.0 K/uL   Eosinophils Absolute 0.8 (H) 0.0 - 0.7 K/uL   Basophils Absolute 0.1 0.0 - 0.1 K/uL  Lipid panel     Status: Abnormal   Collection Time: 03/12/18 10:33 AM  Result Value Ref Range   Cholesterol 186 0 - 200 mg/dL    Comment: ATP III Classification       Desirable:  < 200 mg/dL               Borderline High:  200 - 239 mg/dL          High:  > = 240 mg/dL   Triglycerides 159.0 (H) 0.0 - 149.0 mg/dL    Comment: Normal:  <150 mg/dLBorderline High:  150 - 199 mg/dL   HDL 35.90 (L) >39.00 mg/dL   VLDL 31.8 0.0 - 40.0 mg/dL   LDL Cholesterol 118 (H) 0 - 99 mg/dL   Total CHOL/HDL Ratio 5     Comment:                Men          Women1/2 Average Risk     3.4          3.3Average Risk          5.0          4.42X Average Risk          9.6          7.13X Average Risk          15.0          11.0                       NonHDL 149.96     Comment: NOTE:  Non-HDL goal should be 30 mg/dL higher than patient's LDL goal (i.e. LDL goal of < 70 mg/dL, would have non-HDL goal of < 100 mg/dL)  Hemoglobin A1c     Status: Abnormal   Collection Time: 03/12/18 10:33 AM  Result Value Ref Range   Hgb A1c MFr Bld 7.9 (H) 4.6 - 6.5 %    Comment: Glycemic Control Guidelines for People with Diabetes:Non Diabetic:  <6%Goal of Therapy: <7%Additional Action Suggested:  >8%   Comprehensive metabolic panel     Status: Abnormal   Collection Time: 03/12/18 10:33 AM  Result Value Ref Range   Sodium 133 (L) 135 - 145 mEq/L   Potassium 4.5 3.5 - 5.1 mEq/L   Chloride 101 96 - 112 mEq/L   CO2 23 19 - 32 mEq/L   Glucose, Bld 206 (H) 70 - 99 mg/dL   BUN 30 (H) 6 - 23 mg/dL   Creatinine, Ser 1.74 (H) 0.40 - 1.50 mg/dL   Total Bilirubin 0.4 0.2 - 1.2 mg/dL   Alkaline Phosphatase 62 39 - 117 U/L   AST 11 0 - 37 U/L   ALT 10 0 - 53 U/L    Total Protein 7.1 6.0 - 8.3 g/dL   Albumin 4.0 3.5 - 5.2 g/dL   Calcium 9.6 8.4 - 10.5 mg/dL   GFR 39.17 (L) >60.00 mL/min  Vitamin D (25 hydroxy)     Status: None   Collection Time: 03/12/18 10:33 AM  Result Value Ref Range   VITD 64.36 30.00 - 100.00 ng/mL  B12     Status: Abnormal   Collection Time:  03/12/18 10:33 AM  Result Value Ref Range   Vitamin B-12 1,373 (H) 211 - 911 pg/mL  Basic metabolic panel     Status: Abnormal   Collection Time: 03/30/18 11:17 AM  Result Value Ref Range   Sodium 134 (L) 135 - 145 mEq/L   Potassium 4.2 3.5 - 5.1 mEq/L   Chloride 101 96 - 112 mEq/L   CO2 23 19 - 32 mEq/L   Glucose, Bld 225 (H) 70 - 99 mg/dL   BUN 23 6 - 23 mg/dL   Creatinine, Ser 1.61 (H) 0.40 - 1.50 mg/dL   Calcium 9.3 8.4 - 10.5 mg/dL   GFR 42.84 (L) >60.00 mL/min  CBC with Differential/Platelet     Status: Abnormal   Collection Time: 05/01/18  2:00 PM  Result Value Ref Range   WBC 12.4 (H) 4.0 - 10.5 K/uL   RBC 4.13 (L) 4.22 - 5.81 Mil/uL   Hemoglobin 11.9 (L) 13.0 - 17.0 g/dL   HCT 34.9 (L) 39.0 - 52.0 %   MCV 84.6 78.0 - 100.0 fl   MCHC 34.1 30.0 - 36.0 g/dL   RDW 14.2 11.5 - 15.5 %   Platelets 322.0 150.0 - 400.0 K/uL   Neutrophils Relative % 67.1 43.0 - 77.0 %   Lymphocytes Relative 20.2 12.0 - 46.0 %   Monocytes Relative 5.4 3.0 - 12.0 %   Eosinophils Relative 6.6 (H) 0.0 - 5.0 %   Basophils Relative 0.7 0.0 - 3.0 %   Neutro Abs 8.3 (H) 1.4 - 7.7 K/uL   Lymphs Abs 2.5 0.7 - 4.0 K/uL   Monocytes Absolute 0.7 0.1 - 1.0 K/uL   Eosinophils Absolute 0.8 (H) 0.0 - 0.7 K/uL   Basophils Absolute 0.1 0.0 - 0.1 K/uL  Comprehensive metabolic panel     Status: Abnormal   Collection Time: 05/01/18  2:00 PM  Result Value Ref Range   Sodium 133 (L) 135 - 145 mEq/L   Potassium 4.1 3.5 - 5.1 mEq/L   Chloride 99 96 - 112 mEq/L   CO2 24 19 - 32 mEq/L   Glucose, Bld 213 (H) 70 - 99 mg/dL   BUN 22 6 - 23 mg/dL   Creatinine, Ser 1.74 (H) 0.40 - 1.50 mg/dL   Total Bilirubin 0.4  0.2 - 1.2 mg/dL   Alkaline Phosphatase 65 39 - 117 U/L   AST 15 0 - 37 U/L   ALT 12 0 - 53 U/L   Total Protein 7.5 6.0 - 8.3 g/dL   Albumin 4.1 3.5 - 5.2 g/dL   Calcium 10.0 8.4 - 10.5 mg/dL   GFR 39.16 (L) >60.00 mL/min  POCT urinalysis dipstick     Status: Abnormal   Collection Time: 05/11/18  2:24 PM  Result Value Ref Range   Color, UA yellow    Clarity, UA cloudy    Glucose, UA Negative Negative   Bilirubin, UA negative    Ketones, UA negative    Spec Grav, UA 1.010 1.010 - 1.025   Blood, UA negative    pH, UA 5.0 5.0 - 8.0   Protein, UA Positive (A) Negative    Comment: trace   Urobilinogen, UA 0.2 0.2 or 1.0 E.U./dL   Nitrite, UA positive    Leukocytes, UA Large (3+) (A) Negative   Appearance     Odor    Urine Culture     Status: Abnormal   Collection Time: 05/11/18  2:30 PM  Result Value Ref Range   MICRO NUMBER: [id number]  SPECIMEN QUALITY: ADEQUATE    Sample Source NOT GIVEN    STATUS: FINAL    ISOLATE 1: Klebsiella pneumoniae (A)     Comment: Greater than 100,000 CFU/mL of Klebsiella pneumoniae      Susceptibility   Klebsiella pneumoniae - URINE CULTURE, REFLEX    AMOX/CLAVULANIC <=2 Sensitive     AMPICILLIN  Resistant     AMPICILLIN/SULBACTAM <=2 Sensitive     CEFAZOLIN* <=4 Not Reportable      * For infections other than uncomplicated UTIcaused by E. coli, K. pneumoniae or P. mirabilis:Cefazolin is resistant if MIC > or = 8 mcg/mL.(Distinguishing susceptible versus intermediatefor isolates with MIC < or = 4 mcg/mL requiresadditional testing.)For uncomplicated UTI caused by E. coli,K. pneumoniae or P. mirabilis: Cefazolin issusceptible if MIC <32 mcg/mL and predictssusceptible to the oral agents cefaclor, cefdinir,cefpodoxime, cefprozil, cefuroxime, cephalexinand loracarbef.    CEFEPIME <=1 Sensitive     CEFTRIAXONE <=1 Sensitive     CIPROFLOXACIN <=0.25 Sensitive     LEVOFLOXACIN <=0.12 Sensitive     ERTAPENEM <=0.5 Sensitive     GENTAMICIN <=1 Sensitive       IMIPENEM <=0.25 Sensitive     NITROFURANTOIN 64 Intermediate     PIP/TAZO <=4 Sensitive     TOBRAMYCIN <=1 Sensitive     TRIMETH/SULFA* <=20 Sensitive      * For infections other than uncomplicated UTIcaused by E. coli, K. pneumoniae or P. mirabilis:Cefazolin is resistant if MIC > or = 8 mcg/mL.(Distinguishing susceptible versus intermediatefor isolates with MIC < or = 4 mcg/mL requiresadditional testing.)For uncomplicated UTI caused by E. coli,K. pneumoniae or P. mirabilis: Cefazolin issusceptible if MIC <32 mcg/mL and predictssusceptible to the oral agents cefaclor, cefdinir,cefpodoxime, cefprozil, cefuroxime, cephalexinand loracarbef.Legend:S = Susceptible  I = IntermediateR = Resistant  NS = Not susceptible* = Not tested  NR = Not reported**NN = See antimicrobic comments  Basic metabolic panel     Status: Abnormal   Collection Time: 06/02/18  2:17 PM  Result Value Ref Range   Sodium 135 135 - 145 mEq/L   Potassium 3.9 3.5 - 5.1 mEq/L   Chloride 100 96 - 112 mEq/L   CO2 27 19 - 32 mEq/L   Glucose, Bld 138 (H) 70 - 99 mg/dL   BUN 29 (H) 6 - 23 mg/dL   Creatinine, Ser 1.69 (H) 0.40 - 1.50 mg/dL   Calcium 9.5 8.4 - 10.5 mg/dL   GFR 40.49 (L) >60.00 mL/min  Urinalysis, Routine w reflex microscopic     Status: Abnormal   Collection Time: 06/02/18  2:17 PM  Result Value Ref Range   Color, Urine YELLOW Yellow;Lt. Yellow   APPearance Cloudy (A) Clear   Specific Gravity, Urine <=1.005 (A) 1.000 - 1.030   pH 6.0 5.0 - 8.0   Total Protein, Urine NEGATIVE Negative   Urine Glucose NEGATIVE Negative   Ketones, ur NEGATIVE Negative   Bilirubin Urine NEGATIVE Negative   Hgb urine dipstick NEGATIVE Negative   Urobilinogen, UA 0.2 0.0 - 1.0   Leukocytes, UA LARGE (A) Negative   Nitrite NEGATIVE Negative   WBC, UA 21-50/hpf (A) 0-2/hpf   RBC / HPF none seen 0-2/hpf   Squamous Epithelial / LPF Rare(0-4/hpf) Rare(0-4/hpf)   Bacteria, UA Many(>50/hpf) (A) None  Urine Culture     Status:  Abnormal   Collection Time: 06/02/18  2:17 PM  Result Value Ref Range   MICRO NUMBER: [id number]    SPECIMEN QUALITY: ADEQUATE    Sample Source NOT GIVEN  STATUS: FINAL    ISOLATE 1: Klebsiella pneumoniae (A)     Comment: Greater than 100,000 CFU/mL of Klebsiella pneumoniae      Susceptibility   Klebsiella pneumoniae - URINE CULTURE, REFLEX    AMOX/CLAVULANIC <=2 Sensitive     AMPICILLIN  Resistant     AMPICILLIN/SULBACTAM 4 Sensitive     CEFAZOLIN* <=4 Not Reportable      * For infections other than uncomplicated UTIcaused by E. coli, K. pneumoniae or P. mirabilis:Cefazolin is resistant if MIC > or = 8 mcg/mL.(Distinguishing susceptible versus intermediatefor isolates with MIC < or = 4 mcg/mL requiresadditional testing.)For uncomplicated UTI caused by E. coli,K. pneumoniae or P. mirabilis: Cefazolin issusceptible if MIC <32 mcg/mL and predictssusceptible to the oral agents cefaclor, cefdinir,cefpodoxime, cefprozil, cefuroxime, cephalexinand loracarbef.    CEFEPIME <=1 Sensitive     CEFTRIAXONE <=1 Sensitive     CIPROFLOXACIN <=0.25 Sensitive     LEVOFLOXACIN <=0.12 Sensitive     ERTAPENEM <=0.5 Sensitive     GENTAMICIN <=1 Sensitive     IMIPENEM <=0.25 Sensitive     NITROFURANTOIN 64 Intermediate     PIP/TAZO <=4 Sensitive     TOBRAMYCIN <=1 Sensitive     TRIMETH/SULFA* <=20 Sensitive      * For infections other than uncomplicated UTIcaused by E. coli, K. pneumoniae or P. mirabilis:Cefazolin is resistant if MIC > or = 8 mcg/mL.(Distinguishing susceptible versus intermediatefor isolates with MIC < or = 4 mcg/mL requiresadditional testing.)For uncomplicated UTI caused by E. coli,K. pneumoniae or P. mirabilis: Cefazolin issusceptible if MIC <32 mcg/mL and predictssusceptible to the oral agents cefaclor, cefdinir,cefpodoxime, cefprozil, cefuroxime, cephalexinand loracarbef.Legend:S = Susceptible  I = IntermediateR = Resistant  NS = Not susceptible* = Not tested  NR = Not reported**NN = See  antimicrobic comments    Assessment/Plan: 1. Essential hypertension Asymptomatic. Giving CV history, will increase amlodipine to 5 mg daily. Consistent diet recommended. Follow-up discussed.    Leeanne Rio, PA-C

## 2018-06-09 DIAGNOSIS — Z436 Encounter for attention to other artificial openings of urinary tract: Secondary | ICD-10-CM | POA: Diagnosis not present

## 2018-06-09 DIAGNOSIS — Z7984 Long term (current) use of oral hypoglycemic drugs: Secondary | ICD-10-CM | POA: Diagnosis not present

## 2018-06-09 DIAGNOSIS — I129 Hypertensive chronic kidney disease with stage 1 through stage 4 chronic kidney disease, or unspecified chronic kidney disease: Secondary | ICD-10-CM | POA: Diagnosis present

## 2018-06-09 DIAGNOSIS — B9689 Other specified bacterial agents as the cause of diseases classified elsewhere: Secondary | ICD-10-CM | POA: Diagnosis not present

## 2018-06-09 DIAGNOSIS — R404 Transient alteration of awareness: Secondary | ICD-10-CM | POA: Diagnosis not present

## 2018-06-09 DIAGNOSIS — Z7982 Long term (current) use of aspirin: Secondary | ICD-10-CM | POA: Diagnosis not present

## 2018-06-09 DIAGNOSIS — I69354 Hemiplegia and hemiparesis following cerebral infarction affecting left non-dominant side: Secondary | ICD-10-CM | POA: Diagnosis not present

## 2018-06-09 DIAGNOSIS — J690 Pneumonitis due to inhalation of food and vomit: Secondary | ICD-10-CM | POA: Diagnosis not present

## 2018-06-09 DIAGNOSIS — R4781 Slurred speech: Secondary | ICD-10-CM | POA: Diagnosis not present

## 2018-06-09 DIAGNOSIS — Z87440 Personal history of urinary (tract) infections: Secondary | ICD-10-CM | POA: Diagnosis not present

## 2018-06-09 DIAGNOSIS — I69391 Dysphagia following cerebral infarction: Secondary | ICD-10-CM | POA: Diagnosis not present

## 2018-06-09 DIAGNOSIS — G92 Toxic encephalopathy: Secondary | ICD-10-CM | POA: Diagnosis present

## 2018-06-09 DIAGNOSIS — I69334 Monoplegia of upper limb following cerebral infarction affecting left non-dominant side: Secondary | ICD-10-CM | POA: Diagnosis not present

## 2018-06-09 DIAGNOSIS — Z8701 Personal history of pneumonia (recurrent): Secondary | ICD-10-CM | POA: Diagnosis not present

## 2018-06-09 DIAGNOSIS — Z66 Do not resuscitate: Secondary | ICD-10-CM | POA: Diagnosis present

## 2018-06-09 DIAGNOSIS — I4891 Unspecified atrial fibrillation: Secondary | ICD-10-CM | POA: Diagnosis not present

## 2018-06-09 DIAGNOSIS — E871 Hypo-osmolality and hyponatremia: Secondary | ICD-10-CM | POA: Diagnosis present

## 2018-06-09 DIAGNOSIS — G40909 Epilepsy, unspecified, not intractable, without status epilepticus: Secondary | ICD-10-CM | POA: Diagnosis not present

## 2018-06-09 DIAGNOSIS — T83511A Infection and inflammatory reaction due to indwelling urethral catheter, initial encounter: Secondary | ICD-10-CM | POA: Diagnosis present

## 2018-06-09 DIAGNOSIS — N179 Acute kidney failure, unspecified: Secondary | ICD-10-CM | POA: Diagnosis not present

## 2018-06-09 DIAGNOSIS — Z79899 Other long term (current) drug therapy: Secondary | ICD-10-CM | POA: Diagnosis not present

## 2018-06-09 DIAGNOSIS — E785 Hyperlipidemia, unspecified: Secondary | ICD-10-CM | POA: Diagnosis not present

## 2018-06-09 DIAGNOSIS — G9340 Encephalopathy, unspecified: Secondary | ICD-10-CM | POA: Diagnosis not present

## 2018-06-09 DIAGNOSIS — R531 Weakness: Secondary | ICD-10-CM | POA: Diagnosis not present

## 2018-06-09 DIAGNOSIS — I69854 Hemiplegia and hemiparesis following other cerebrovascular disease affecting left non-dominant side: Secondary | ICD-10-CM | POA: Diagnosis not present

## 2018-06-09 DIAGNOSIS — N319 Neuromuscular dysfunction of bladder, unspecified: Secondary | ICD-10-CM | POA: Diagnosis present

## 2018-06-09 DIAGNOSIS — E861 Hypovolemia: Secondary | ICD-10-CM | POA: Diagnosis present

## 2018-06-09 DIAGNOSIS — R279 Unspecified lack of coordination: Secondary | ICD-10-CM | POA: Diagnosis not present

## 2018-06-09 DIAGNOSIS — E1122 Type 2 diabetes mellitus with diabetic chronic kidney disease: Secondary | ICD-10-CM | POA: Diagnosis present

## 2018-06-09 DIAGNOSIS — Z8673 Personal history of transient ischemic attack (TIA), and cerebral infarction without residual deficits: Secondary | ICD-10-CM | POA: Diagnosis not present

## 2018-06-09 DIAGNOSIS — N183 Chronic kidney disease, stage 3 (moderate): Secondary | ICD-10-CM | POA: Diagnosis present

## 2018-06-09 DIAGNOSIS — G4089 Other seizures: Secondary | ICD-10-CM | POA: Diagnosis not present

## 2018-06-09 DIAGNOSIS — R1312 Dysphagia, oropharyngeal phase: Secondary | ICD-10-CM | POA: Diagnosis not present

## 2018-06-09 DIAGNOSIS — R4182 Altered mental status, unspecified: Secondary | ICD-10-CM | POA: Diagnosis not present

## 2018-06-09 DIAGNOSIS — Z7901 Long term (current) use of anticoagulants: Secondary | ICD-10-CM | POA: Diagnosis not present

## 2018-06-09 DIAGNOSIS — R569 Unspecified convulsions: Secondary | ICD-10-CM | POA: Diagnosis not present

## 2018-06-09 DIAGNOSIS — Z960 Presence of urogenital implants: Secondary | ICD-10-CM | POA: Diagnosis not present

## 2018-06-09 DIAGNOSIS — E1165 Type 2 diabetes mellitus with hyperglycemia: Secondary | ICD-10-CM | POA: Diagnosis not present

## 2018-06-09 DIAGNOSIS — I480 Paroxysmal atrial fibrillation: Secondary | ICD-10-CM | POA: Diagnosis not present

## 2018-06-09 DIAGNOSIS — Z743 Need for continuous supervision: Secondary | ICD-10-CM | POA: Diagnosis not present

## 2018-06-09 DIAGNOSIS — I639 Cerebral infarction, unspecified: Secondary | ICD-10-CM | POA: Diagnosis not present

## 2018-06-09 DIAGNOSIS — G9349 Other encephalopathy: Secondary | ICD-10-CM | POA: Diagnosis not present

## 2018-06-09 DIAGNOSIS — N390 Urinary tract infection, site not specified: Secondary | ICD-10-CM | POA: Diagnosis present

## 2018-06-09 DIAGNOSIS — F0150 Vascular dementia without behavioral disturbance: Secondary | ICD-10-CM | POA: Diagnosis not present

## 2018-06-09 DIAGNOSIS — G40209 Localization-related (focal) (partial) symptomatic epilepsy and epileptic syndromes with complex partial seizures, not intractable, without status epilepticus: Secondary | ICD-10-CM | POA: Diagnosis not present

## 2018-06-10 ENCOUNTER — Encounter: Payer: Self-pay | Admitting: Physician Assistant

## 2018-06-11 ENCOUNTER — Encounter: Payer: Self-pay | Admitting: Physician Assistant

## 2018-06-11 MED ORDER — GENERIC EXTERNAL MEDICATION
1.00 | Status: DC
Start: ? — End: 2018-06-11

## 2018-06-11 MED ORDER — GENERIC EXTERNAL MEDICATION
1.00 | Status: DC
Start: 2018-06-15 — End: 2018-06-11

## 2018-06-11 MED ORDER — ZOLPIDEM TARTRATE 5 MG PO TABS
5.00 | ORAL_TABLET | ORAL | Status: DC
Start: ? — End: 2018-06-11

## 2018-06-11 MED ORDER — LEVOTHYROXINE SODIUM 50 MCG PO TABS
50.00 | ORAL_TABLET | ORAL | Status: DC
Start: 2018-06-16 — End: 2018-06-11

## 2018-06-11 MED ORDER — DIPHENHYDRAMINE HCL 25 MG PO CAPS
25.00 | ORAL_CAPSULE | ORAL | Status: DC
Start: ? — End: 2018-06-11

## 2018-06-11 MED ORDER — BISACODYL 5 MG PO TBEC
10.00 | DELAYED_RELEASE_TABLET | ORAL | Status: DC
Start: ? — End: 2018-06-11

## 2018-06-11 MED ORDER — ONDANSETRON 4 MG PO TBDP
4.00 | ORAL_TABLET | ORAL | Status: DC
Start: ? — End: 2018-06-11

## 2018-06-11 MED ORDER — INSULIN LISPRO 100 UNIT/ML ~~LOC~~ SOLN
1.00 | SUBCUTANEOUS | Status: DC
Start: 2018-06-15 — End: 2018-06-11

## 2018-06-11 MED ORDER — HYDRALAZINE HCL 20 MG/ML IJ SOLN
10.00 | INTRAMUSCULAR | Status: DC
Start: ? — End: 2018-06-11

## 2018-06-11 MED ORDER — MICONAZOLE NITRATE 2 % EX CREA
TOPICAL_CREAM | CUTANEOUS | Status: DC
Start: ? — End: 2018-06-11

## 2018-06-11 MED ORDER — AMLODIPINE BESYLATE 5 MG PO TABS
10.00 | ORAL_TABLET | ORAL | Status: DC
Start: 2018-06-16 — End: 2018-06-11

## 2018-06-11 MED ORDER — GLUCOSE 40 % PO GEL
15.00 | ORAL | Status: DC
Start: ? — End: 2018-06-11

## 2018-06-11 MED ORDER — CLONIDINE HCL 0.1 MG PO TABS
.10 | ORAL_TABLET | ORAL | Status: DC
Start: ? — End: 2018-06-11

## 2018-06-11 MED ORDER — APIXABAN 2.5 MG PO TABS
2.50 | ORAL_TABLET | ORAL | Status: DC
Start: 2018-06-15 — End: 2018-06-11

## 2018-06-11 MED ORDER — HYDROCODONE-ACETAMINOPHEN 5-325 MG PO TABS
1.00 | ORAL_TABLET | ORAL | Status: DC
Start: ? — End: 2018-06-11

## 2018-06-11 MED ORDER — ALUMINUM-MAGNESIUM-SIMETHICONE 200-200-20 MG/5ML PO SUSP
30.00 | ORAL | Status: DC
Start: ? — End: 2018-06-11

## 2018-06-11 MED ORDER — LEVETIRACETAM 500 MG PO TABS
500.00 | ORAL_TABLET | ORAL | Status: DC
Start: 2018-06-15 — End: 2018-06-11

## 2018-06-11 MED ORDER — GENERIC EXTERNAL MEDICATION
500.00 | Status: DC
Start: 2018-06-11 — End: 2018-06-11

## 2018-06-11 MED ORDER — ACETAMINOPHEN 325 MG PO TABS
650.00 | ORAL_TABLET | ORAL | Status: DC
Start: ? — End: 2018-06-11

## 2018-06-11 MED ORDER — DOCUSATE SODIUM 100 MG PO CAPS
100.00 | ORAL_CAPSULE | ORAL | Status: DC
Start: 2018-06-15 — End: 2018-06-11

## 2018-06-11 MED ORDER — SORBITOL 70 % RE SOLN
30.00 | RECTAL | Status: DC
Start: ? — End: 2018-06-11

## 2018-06-11 MED ORDER — ONDANSETRON HCL 4 MG/2ML IJ SOLN
4.00 | INTRAMUSCULAR | Status: DC
Start: ? — End: 2018-06-11

## 2018-06-11 MED ORDER — DEXTROSE 10 % IV SOLN
125.00 | INTRAVENOUS | Status: DC
Start: ? — End: 2018-06-11

## 2018-06-11 NOTE — Telephone Encounter (Signed)
Called and spoke with patient's wife. Will keep an eye out on patient's condition via Care Everywhere notes. For now need to wait on MRI and blood cultures to know where to go from here.

## 2018-06-12 ENCOUNTER — Ambulatory Visit: Payer: Medicare Other | Admitting: Neurology

## 2018-06-12 MED ORDER — AMOXICILLIN-POT CLAVULANATE 875-125 MG PO TABS
875.00 | ORAL_TABLET | ORAL | Status: DC
Start: 2018-06-15 — End: 2018-06-12

## 2018-06-16 ENCOUNTER — Encounter: Payer: Self-pay | Admitting: Physician Assistant

## 2018-06-16 MED ORDER — SODIUM CHLORIDE 0.9 % IV SOLN
INTRAVENOUS | Status: DC
Start: ? — End: 2018-06-16

## 2018-06-19 IMAGING — DX DG CHEST 2V
2 series · 2 of 2 positions shown · non-contrast
Comparison: 10/24/2016

CLINICAL DATA: Fever last night. Recent released from hospital for
pneumonia 5 days ago. Former smoker.

EXAM:
CHEST  2 VIEW

[chest lat]
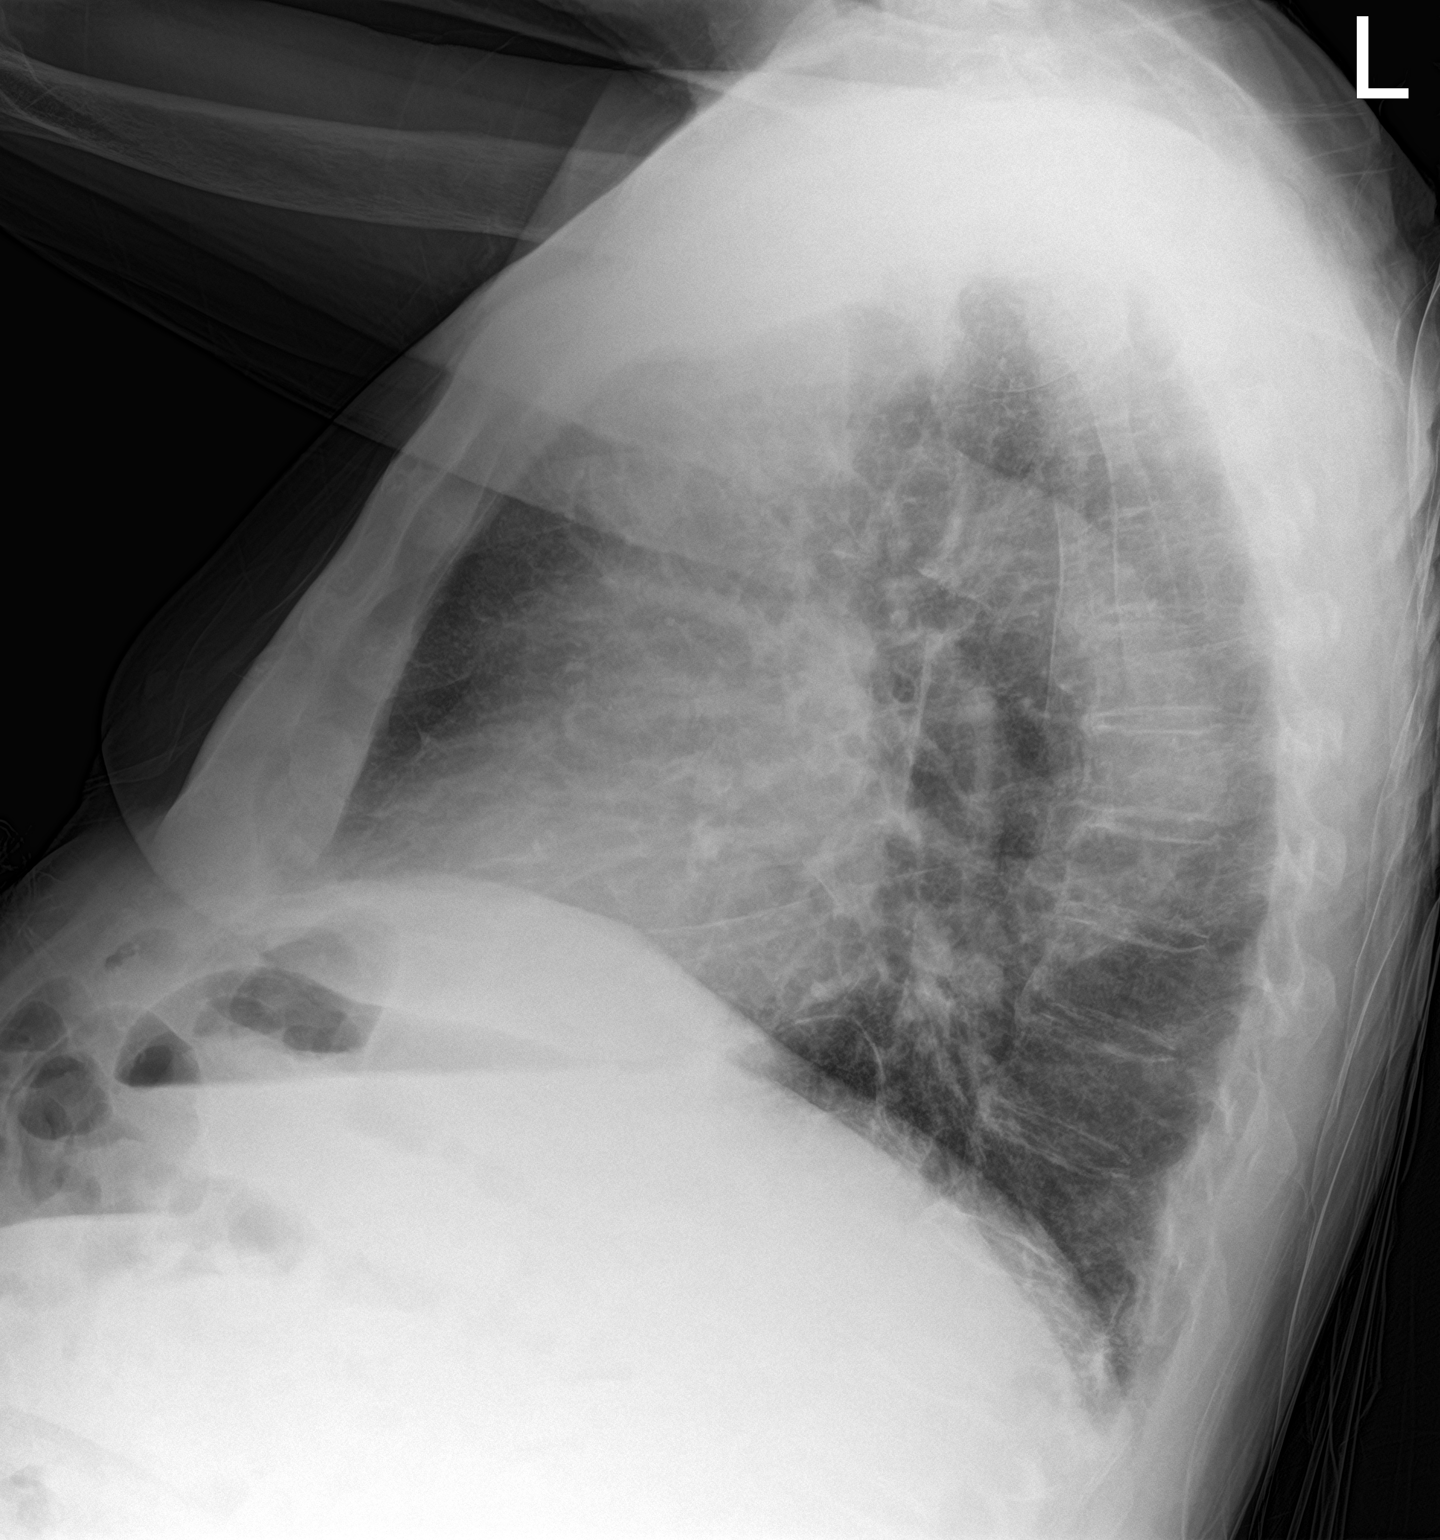

[chest ap strecther]
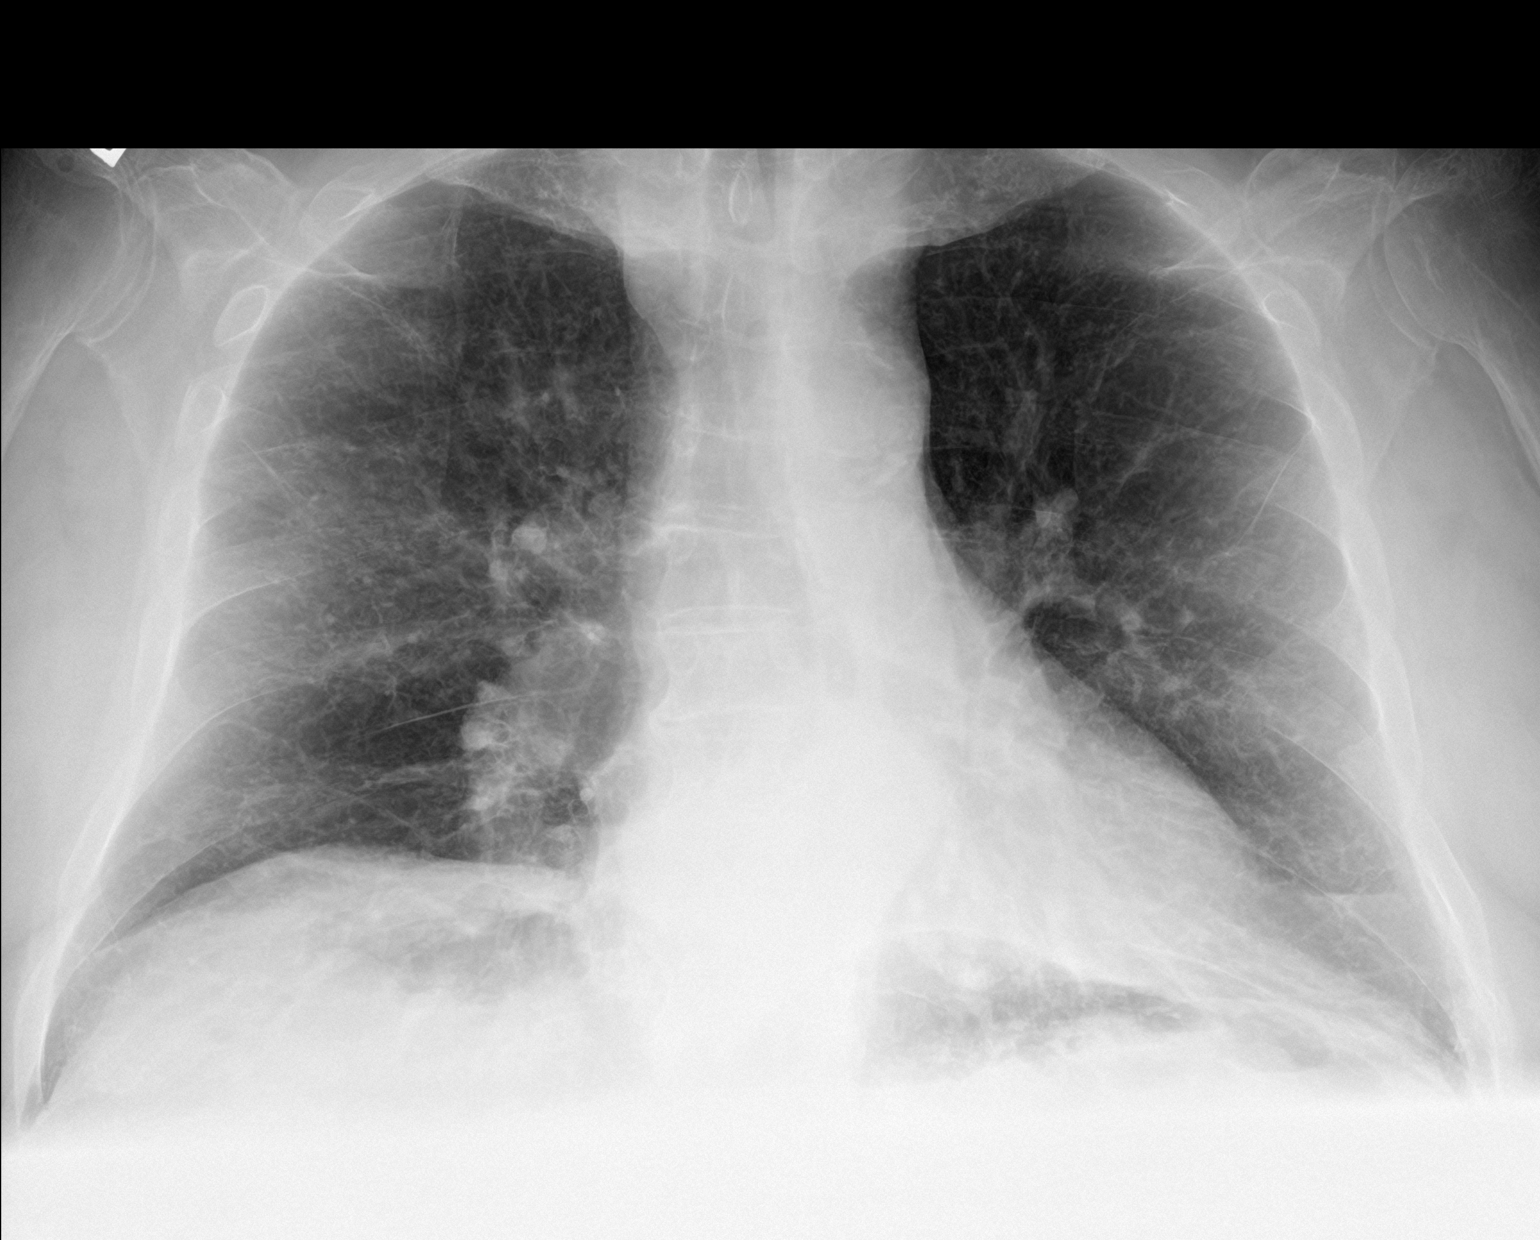

[2 of 2 positions shown; findings below may reference images not displayed]

FINDINGS: Increasing linear opacities in the left lung base may represent
developing pneumonia. Emphysematous changes in the lungs with
scattered fibrosis. No blunting of costophrenic angles. No
pneumothorax. Calcified and tortuous aorta. Normal heart size and
pulmonary vascularity. Degenerative changes in the spine.
IMPRESSION: Increasing linear infiltration in the left lung base may indicate
developing pneumonia.

## 2018-07-01 ENCOUNTER — Encounter: Payer: Self-pay | Admitting: Physician Assistant

## 2018-07-13 ENCOUNTER — Ambulatory Visit: Payer: Medicare Other | Admitting: Neurology

## 2018-07-25 ENCOUNTER — Encounter: Payer: Self-pay | Admitting: Physician Assistant

## 2018-08-10 ENCOUNTER — Ambulatory Visit: Payer: Medicare Other | Admitting: Podiatry

## 2018-08-23 DEATH — deceased

## 2018-10-24 IMAGING — DX DG ABDOMEN ACUTE W/ 1V CHEST
4 series · 4 of 4 positions shown · non-contrast
Comparison: Chest x-ray on 02/18/2017

CLINICAL DATA: Vomiting and fever.

EXAM:
DG ABDOMEN ACUTE W/ 1V CHEST

[chest pa]
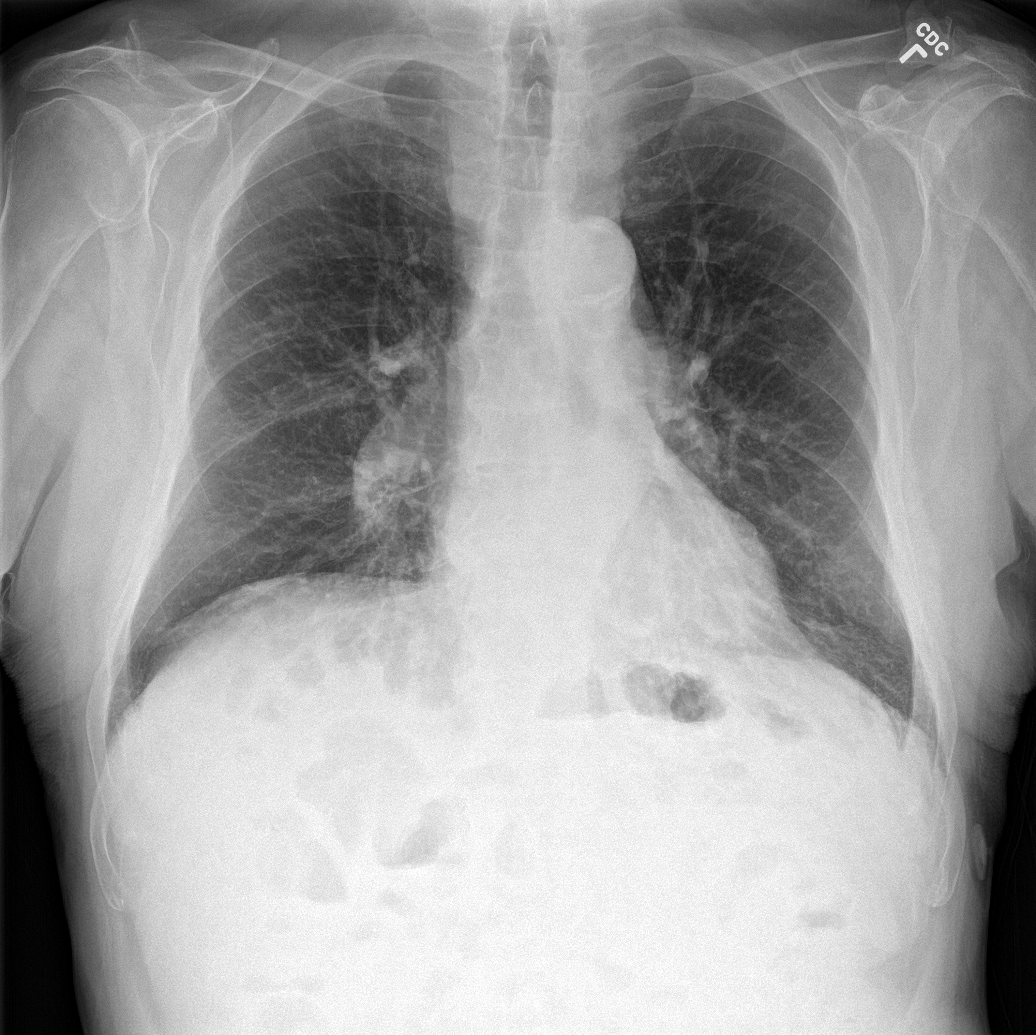

[abdomen erect]
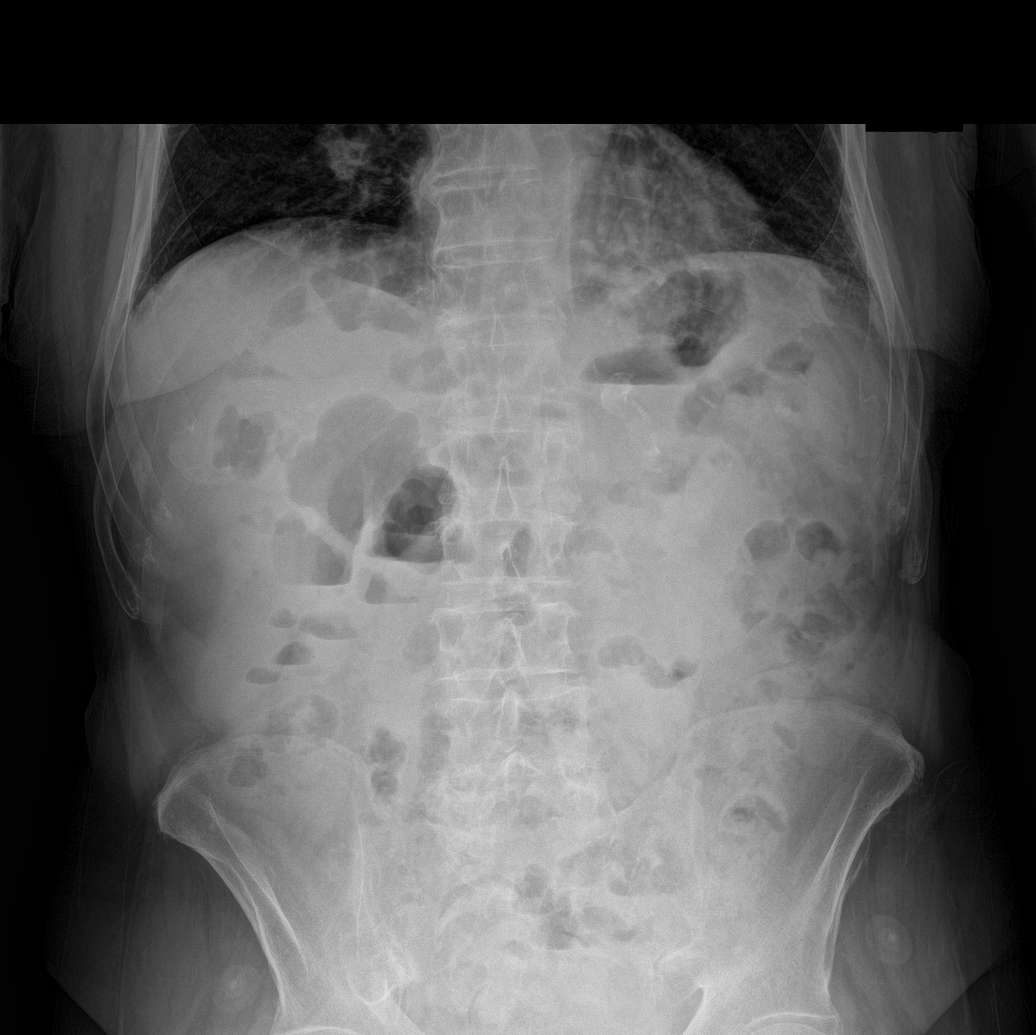

[abdomen supine (1 of 2)]
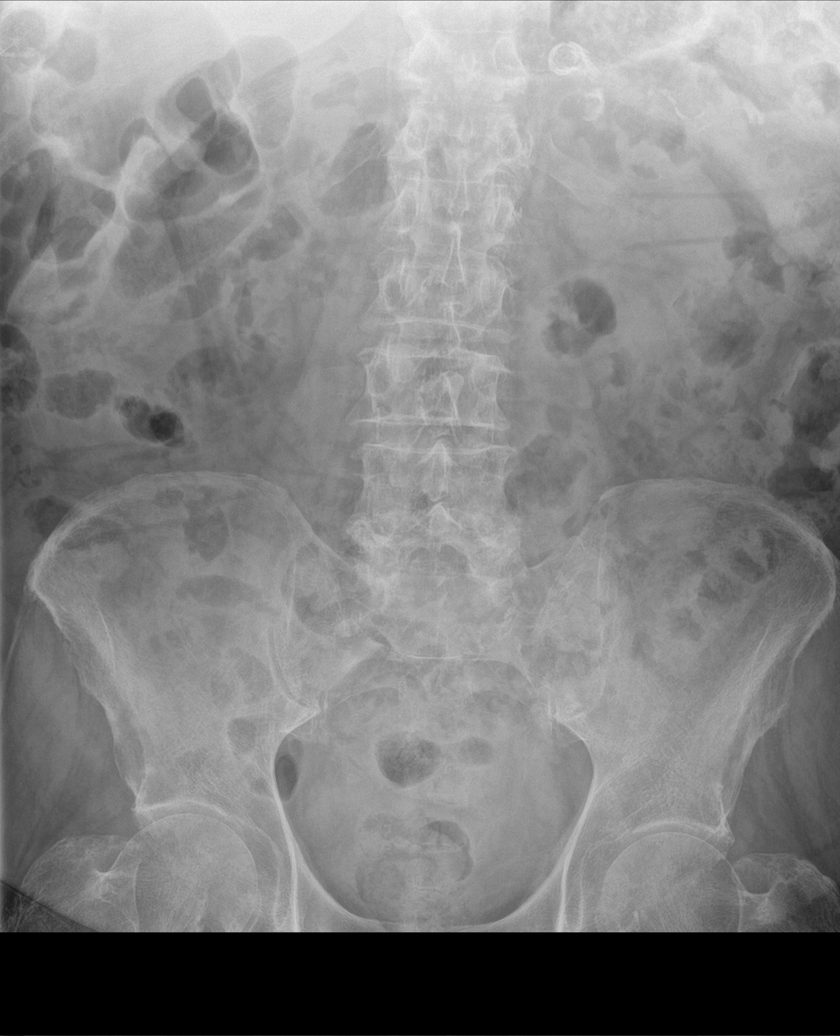

[abdomen supine (2 of 2)]
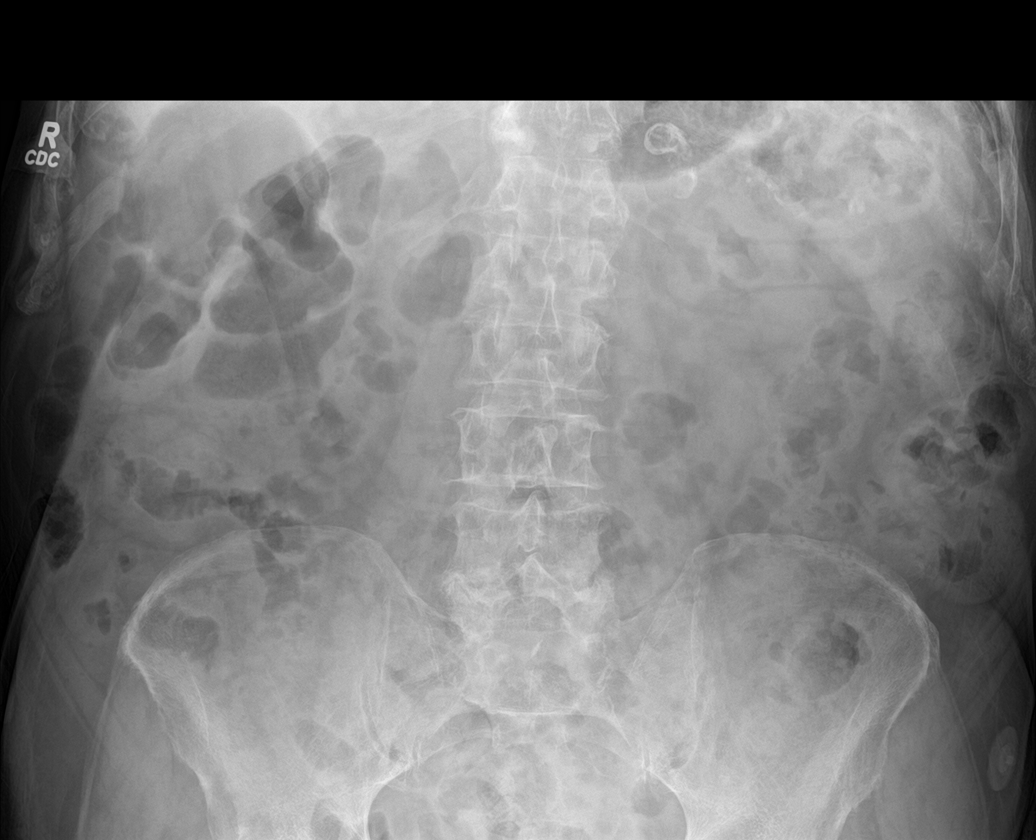

[4 of 4 positions shown; findings below may reference images not displayed]

FINDINGS: There is no evidence of pulmonary edema, consolidation,
pneumothorax, nodule or pleural fluid. The heart size is normal.

Abdominal films show no evidence of acute bowel obstruction or free
intraperitoneal air. There are a few air-fluid levels in small bowel
which may be consistent with enteritis. Scattered stool throughout
the colon without evidence of fecal impaction. Bony structures are
unremarkable. The splenic artery is calcified.
IMPRESSION: Scattered air-fluid levels in small bowel which may be consistent
with enteritis. No evidence of small bowel obstruction or free
intraperitoneal air.

## 2018-11-11 ENCOUNTER — Ambulatory Visit: Payer: Medicare Other | Admitting: Physician Assistant
